# Patient Record
Sex: Female | Born: 1985 | Race: Black or African American | Hispanic: No | Marital: Married | Smoking: Never smoker
Health system: Southern US, Community
[De-identification: ages and names within clinical notes are randomized; demographics above are authoritative.]

## PROBLEM LIST (undated history)

## (undated) DIAGNOSIS — Z8619 Personal history of other infectious and parasitic diseases: Secondary | ICD-10-CM

## (undated) DIAGNOSIS — E1140 Type 2 diabetes mellitus with diabetic neuropathy, unspecified: Secondary | ICD-10-CM

## (undated) DIAGNOSIS — G629 Polyneuropathy, unspecified: Secondary | ICD-10-CM

## (undated) DIAGNOSIS — E109 Type 1 diabetes mellitus without complications: Secondary | ICD-10-CM

## (undated) DIAGNOSIS — K3184 Gastroparesis: Secondary | ICD-10-CM

## (undated) HISTORY — DX: Type 1 diabetes mellitus without complications: E10.9

## (undated) HISTORY — DX: Type 2 diabetes mellitus with diabetic neuropathy, unspecified: E11.40

## (undated) HISTORY — PX: DILATION AND CURETTAGE OF UTERUS: SHX78

## (undated) HISTORY — DX: Personal history of other infectious and parasitic diseases: Z86.19

---
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
## 2006-01-31 ENCOUNTER — Inpatient Hospital Stay (HOSPITAL_COMMUNITY): Admission: AD | Admit: 2006-01-31 | Discharge: 2006-01-31 | Payer: Self-pay | Admitting: Family Medicine

## 2006-06-01 ENCOUNTER — Inpatient Hospital Stay (HOSPITAL_COMMUNITY): Admission: AD | Admit: 2006-06-01 | Discharge: 2006-06-01 | Payer: Self-pay | Admitting: Obstetrics

## 2006-06-11 ENCOUNTER — Inpatient Hospital Stay (HOSPITAL_COMMUNITY): Admission: AD | Admit: 2006-06-11 | Discharge: 2006-06-11 | Payer: Self-pay | Admitting: Obstetrics

## 2006-06-24 ENCOUNTER — Inpatient Hospital Stay (HOSPITAL_COMMUNITY): Admission: AD | Admit: 2006-06-24 | Discharge: 2006-06-24 | Payer: Self-pay | Admitting: Obstetrics

## 2006-06-30 ENCOUNTER — Inpatient Hospital Stay (HOSPITAL_COMMUNITY): Admission: AD | Admit: 2006-06-30 | Discharge: 2006-06-30 | Payer: Self-pay | Admitting: Obstetrics

## 2006-07-06 ENCOUNTER — Inpatient Hospital Stay (HOSPITAL_COMMUNITY): Admission: AD | Admit: 2006-07-06 | Discharge: 2006-07-09 | Payer: Self-pay | Admitting: Obstetrics

## 2007-03-25 ENCOUNTER — Emergency Department (HOSPITAL_COMMUNITY): Admission: EM | Admit: 2007-03-25 | Discharge: 2007-03-25 | Payer: Self-pay | Admitting: Emergency Medicine

## 2008-06-20 ENCOUNTER — Emergency Department (HOSPITAL_COMMUNITY): Admission: EM | Admit: 2008-06-20 | Discharge: 2008-06-20 | Payer: Self-pay | Admitting: Emergency Medicine

## 2008-06-22 ENCOUNTER — Inpatient Hospital Stay (HOSPITAL_COMMUNITY): Admission: EM | Admit: 2008-06-22 | Discharge: 2008-06-24 | Payer: Self-pay | Admitting: Emergency Medicine

## 2008-06-28 ENCOUNTER — Emergency Department (HOSPITAL_COMMUNITY): Admission: EM | Admit: 2008-06-28 | Discharge: 2008-06-28 | Payer: Self-pay | Admitting: Emergency Medicine

## 2008-07-06 ENCOUNTER — Ambulatory Visit: Payer: Self-pay | Admitting: Family Medicine

## 2008-07-07 ENCOUNTER — Ambulatory Visit: Payer: Self-pay | Admitting: *Deleted

## 2008-07-17 ENCOUNTER — Emergency Department (HOSPITAL_COMMUNITY): Admission: EM | Admit: 2008-07-17 | Discharge: 2008-07-17 | Payer: Self-pay | Admitting: Emergency Medicine

## 2008-07-23 ENCOUNTER — Emergency Department (HOSPITAL_COMMUNITY): Admission: EM | Admit: 2008-07-23 | Discharge: 2008-07-23 | Payer: Self-pay | Admitting: Emergency Medicine

## 2009-01-02 ENCOUNTER — Emergency Department (HOSPITAL_COMMUNITY): Admission: EM | Admit: 2009-01-02 | Discharge: 2009-01-02 | Payer: Self-pay | Admitting: Emergency Medicine

## 2009-01-04 ENCOUNTER — Inpatient Hospital Stay (HOSPITAL_COMMUNITY): Admission: AD | Admit: 2009-01-04 | Discharge: 2009-01-04 | Payer: Self-pay | Admitting: Obstetrics & Gynecology

## 2009-05-18 ENCOUNTER — Emergency Department (HOSPITAL_COMMUNITY): Admission: EM | Admit: 2009-05-18 | Discharge: 2009-05-18 | Payer: Self-pay | Admitting: Emergency Medicine

## 2009-10-10 ENCOUNTER — Emergency Department (HOSPITAL_COMMUNITY): Admission: EM | Admit: 2009-10-10 | Discharge: 2009-10-10 | Payer: Self-pay | Admitting: Emergency Medicine

## 2009-10-15 IMAGING — CT CT HEAD W/O CM
1 series · 16 of 30 positions shown, 20 images · non-contrast
Comparison: None available

CLINICAL DATA: Diabetic ketoacidosis.  Severe headache.

CT HEAD WITHOUT CONTRAST
TECHNIQUE: Contiguous axial images were obtained from the base of
the skull through the vertex without contrast.

[Series 2: head_seq 4.5 h37s st · axial · 0.43mm/px · z∈[-186,-38]mm · 16 of 36 slices shown, 20 images]
[im 2/36  brain]
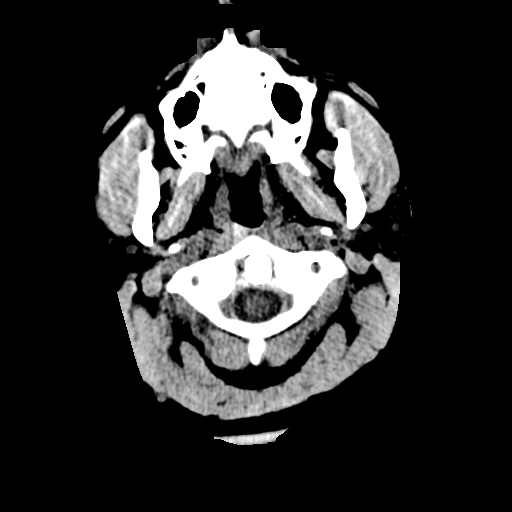
[im 2/36  bone]
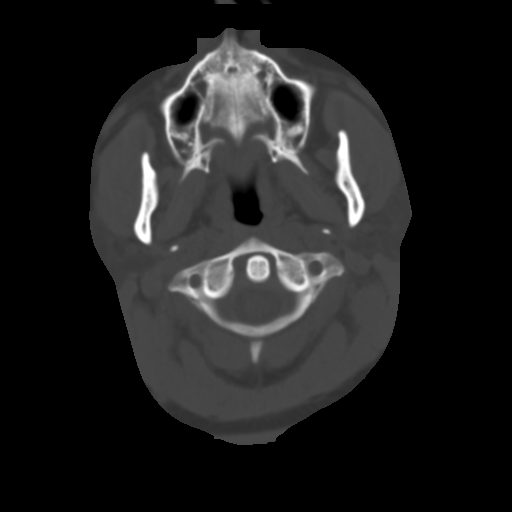
[im 4/36  brain]
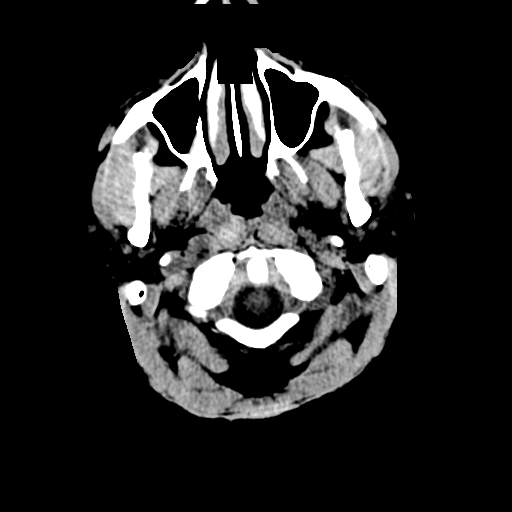
[im 7/36  brain]
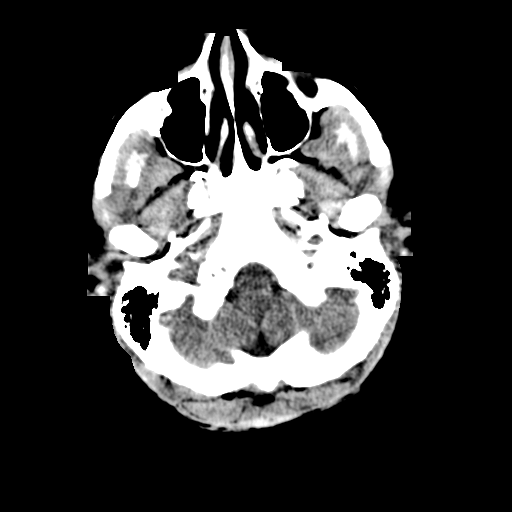
[im 9/36  brain]
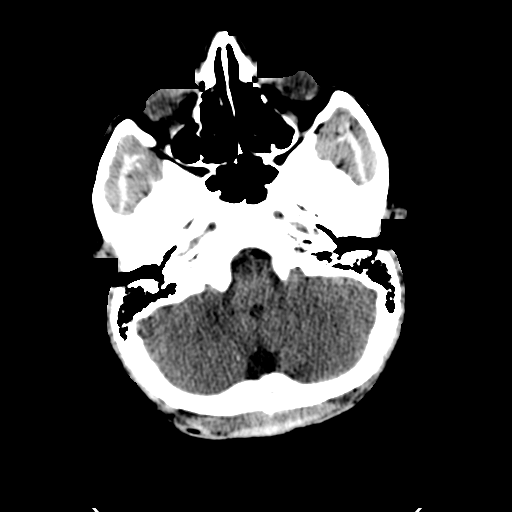
[im 10/36  brain]
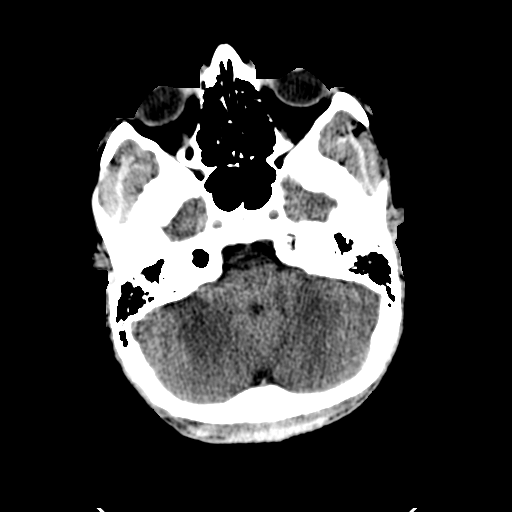
[im 10/36  bone]
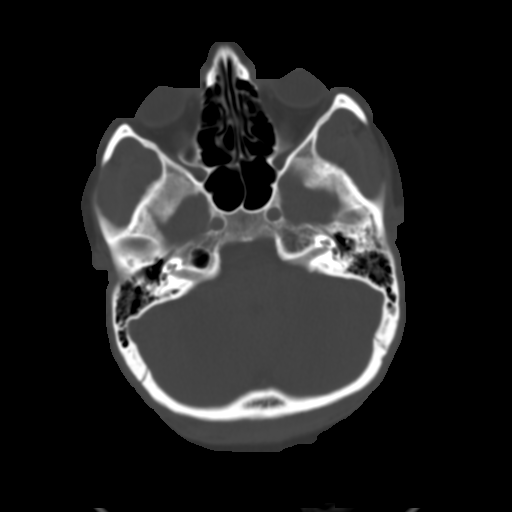
[im 13/36  brain]
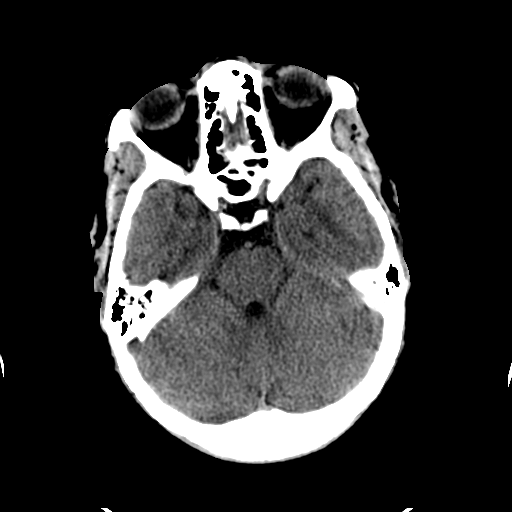
[im 15/36  brain]
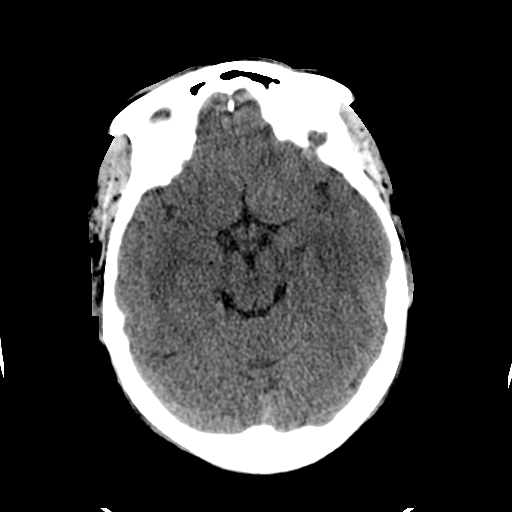
[im 17/36  brain]
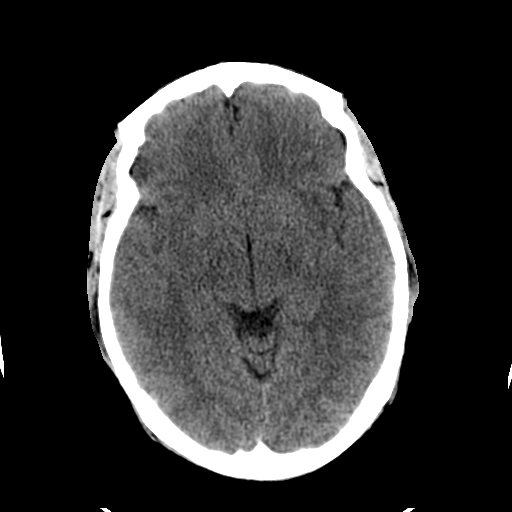
[im 19/36  brain]
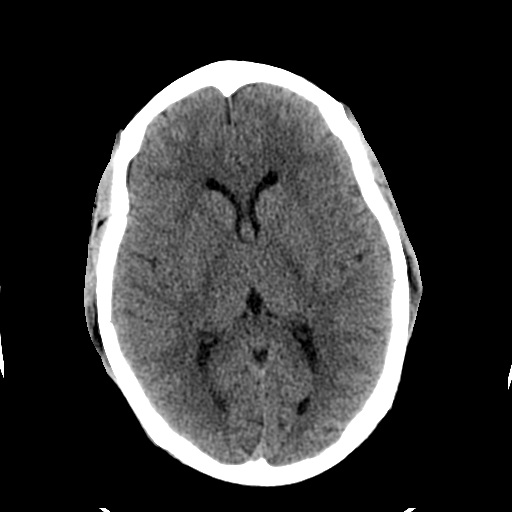
[im 19/36  bone]
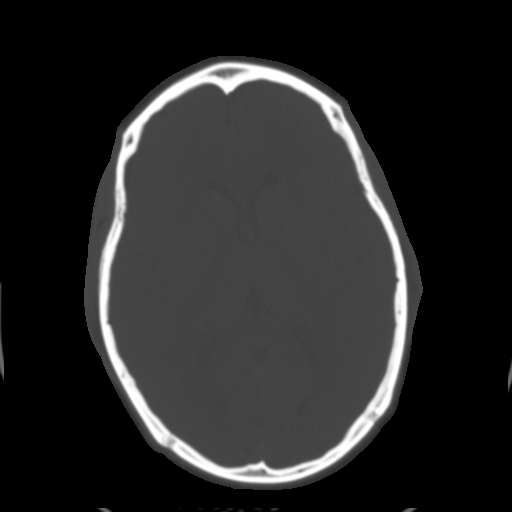
[im 21/36  brain]
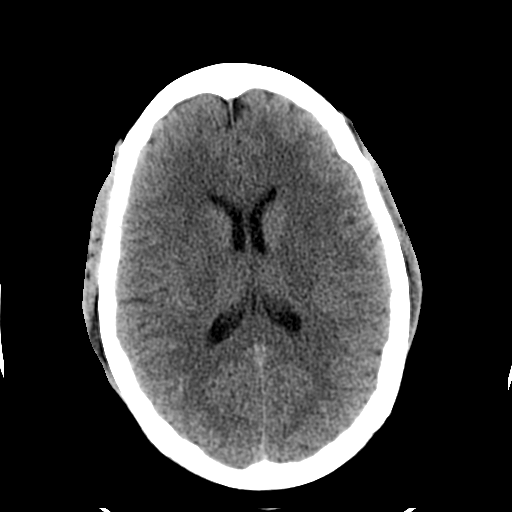
[im 23/36  brain]
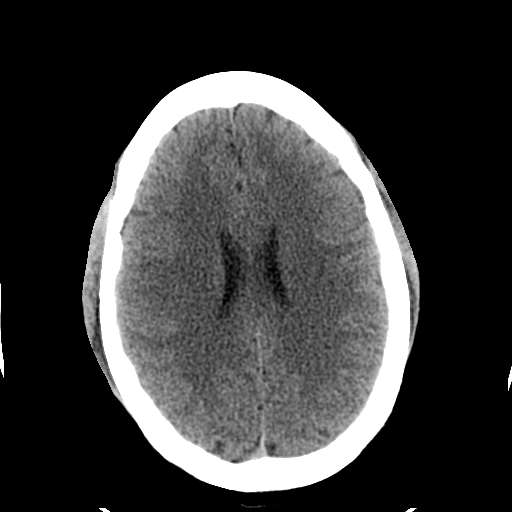
[im 26/36  brain]
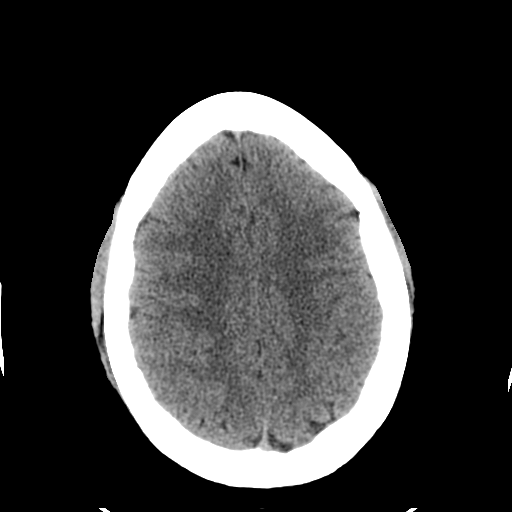
[im 27/36  brain]
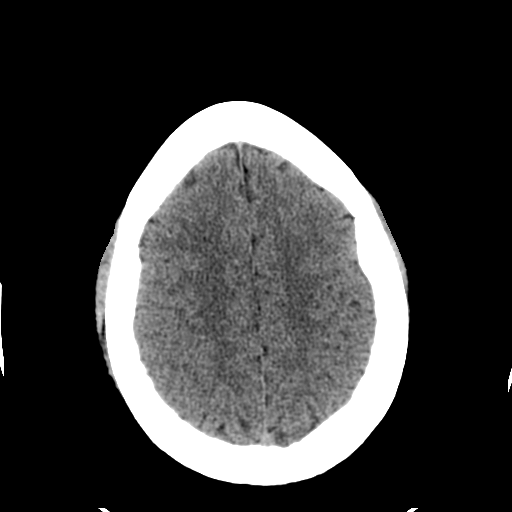
[im 27/36  bone]
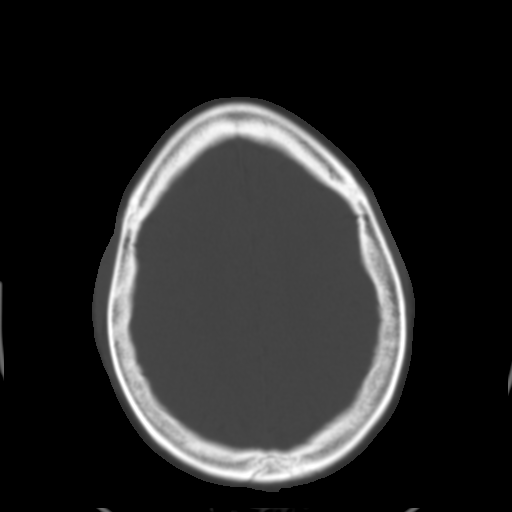
[im 29/36  brain]
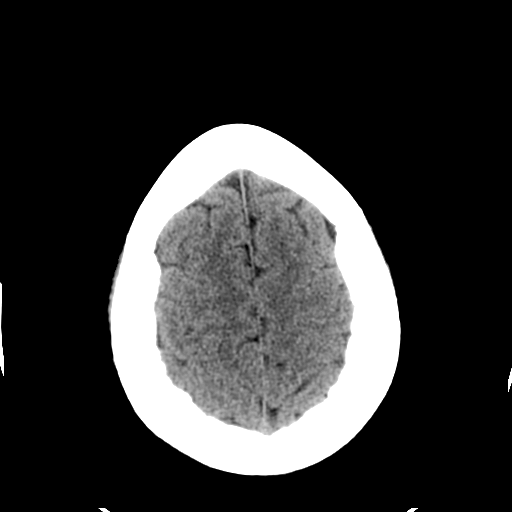
[im 32/36  brain]
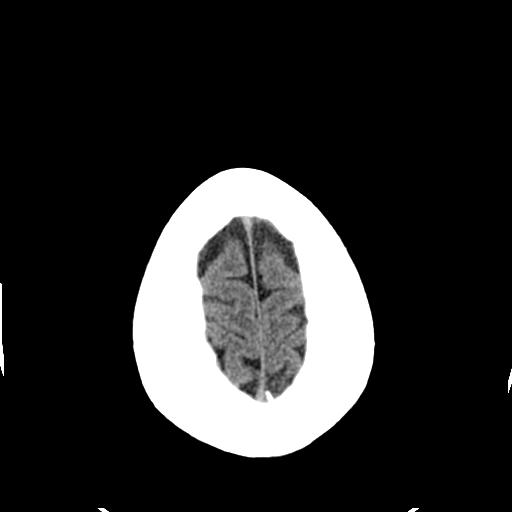
[im 34/36  brain]
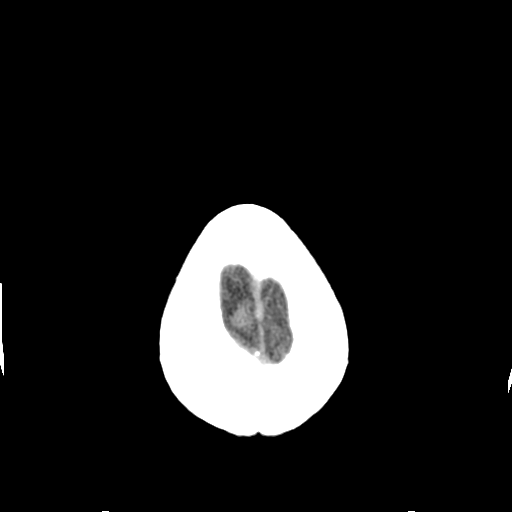

[16 of 30 positions shown; findings below may reference images not displayed]

FINDINGS: Mild right maxillary mucosal thickening.  Mastoid air
cells clear.  Ethmoid air cells clear.  Frontal sinuses
unremarkable.

No mass lesion, mass effect, midline shift, hydrocephalus,
hemorrhage.  No territorial ischemia/infarct.
IMPRESSION: No acute intracranial abnormality.

## 2009-10-25 ENCOUNTER — Emergency Department (HOSPITAL_COMMUNITY): Admission: EM | Admit: 2009-10-25 | Discharge: 2009-10-26 | Payer: Self-pay | Admitting: Emergency Medicine

## 2009-11-27 ENCOUNTER — Emergency Department (HOSPITAL_COMMUNITY): Admission: EM | Admit: 2009-11-27 | Discharge: 2009-11-27 | Payer: Self-pay | Admitting: Emergency Medicine

## 2009-12-18 ENCOUNTER — Emergency Department (HOSPITAL_COMMUNITY): Admission: EM | Admit: 2009-12-18 | Discharge: 2009-12-18 | Payer: Self-pay | Admitting: Family Medicine

## 2010-03-17 ENCOUNTER — Emergency Department (HOSPITAL_COMMUNITY): Admission: EM | Admit: 2010-03-17 | Discharge: 2010-03-18 | Payer: Self-pay | Admitting: Internal Medicine

## 2010-04-06 ENCOUNTER — Emergency Department (HOSPITAL_COMMUNITY): Admission: EM | Admit: 2010-04-06 | Discharge: 2010-04-07 | Payer: Self-pay | Admitting: Emergency Medicine

## 2010-04-30 ENCOUNTER — Emergency Department (HOSPITAL_COMMUNITY): Admission: EM | Admit: 2010-04-30 | Discharge: 2010-04-30 | Payer: Self-pay | Admitting: Emergency Medicine

## 2010-07-02 ENCOUNTER — Emergency Department (HOSPITAL_COMMUNITY): Admission: EM | Admit: 2010-07-02 | Discharge: 2010-07-02 | Payer: Self-pay | Admitting: Emergency Medicine

## 2010-08-26 ENCOUNTER — Inpatient Hospital Stay (HOSPITAL_COMMUNITY)
Admission: EM | Admit: 2010-08-26 | Discharge: 2010-08-27 | Payer: Self-pay | Source: Home / Self Care | Attending: Internal Medicine | Admitting: Internal Medicine

## 2010-09-03 LAB — GLUCOSE, CAPILLARY
Glucose-Capillary: 225 mg/dL — ABNORMAL HIGH (ref 70–99)
Glucose-Capillary: 275 mg/dL — ABNORMAL HIGH (ref 70–99)
Glucose-Capillary: 368 mg/dL — ABNORMAL HIGH (ref 70–99)
Glucose-Capillary: 65 mg/dL — ABNORMAL LOW (ref 70–99)
Glucose-Capillary: >600 mg/dL (ref 70–99)
Glucose-Capillary: 103 mg/dL — ABNORMAL HIGH (ref 70–99)
Glucose-Capillary: 137 mg/dL — ABNORMAL HIGH (ref 70–99)
Glucose-Capillary: 148 mg/dL — ABNORMAL HIGH (ref 70–99)
Glucose-Capillary: 149 mg/dL — ABNORMAL HIGH (ref 70–99)
Glucose-Capillary: 168 mg/dL — ABNORMAL HIGH (ref 70–99)
Glucose-Capillary: 187 mg/dL — ABNORMAL HIGH (ref 70–99)
Glucose-Capillary: 192 mg/dL — ABNORMAL HIGH (ref 70–99)
Glucose-Capillary: 195 mg/dL — ABNORMAL HIGH (ref 70–99)
Glucose-Capillary: 215 mg/dL — ABNORMAL HIGH (ref 70–99)
Glucose-Capillary: 218 mg/dL — ABNORMAL HIGH (ref 70–99)
Glucose-Capillary: 228 mg/dL — ABNORMAL HIGH (ref 70–99)
Glucose-Capillary: 243 mg/dL — ABNORMAL HIGH (ref 70–99)
Glucose-Capillary: 253 mg/dL — ABNORMAL HIGH (ref 70–99)
Glucose-Capillary: 256 mg/dL — ABNORMAL HIGH (ref 70–99)
Glucose-Capillary: 275 mg/dL — ABNORMAL HIGH (ref 70–99)
Glucose-Capillary: 319 mg/dL — ABNORMAL HIGH (ref 70–99)
Glucose-Capillary: 322 mg/dL — ABNORMAL HIGH (ref 70–99)
Glucose-Capillary: 373 mg/dL — ABNORMAL HIGH (ref 70–99)
Glucose-Capillary: 58 mg/dL — ABNORMAL LOW (ref 70–99)
Glucose-Capillary: 61 mg/dL — ABNORMAL LOW (ref 70–99)

## 2010-09-03 LAB — POCT PREGNANCY, URINE: Preg Test, Ur: NEGATIVE

## 2010-09-03 LAB — BASIC METABOLIC PANEL WITH GFR
BUN: 8 mg/dL (ref 6–23)
CO2: 26 meq/L (ref 19–32)
Calcium: 9.7 mg/dL (ref 8.4–10.5)
Chloride: 93 meq/L — ABNORMAL LOW (ref 96–112)
Creatinine, Ser: 0.93 mg/dL (ref 0.4–1.2)
GFR calc Af Amer: >60 mL/min (ref >60)
GFR calc non Af Amer: >60 mL/min (ref >60)
Glucose, Bld: 620 mg/dL (ref 70–99)
Potassium: 4.1 meq/L (ref 3.5–5.1)
Sodium: 130 meq/L — ABNORMAL LOW (ref 135–145)

## 2010-09-03 LAB — BLOOD GAS, ARTERIAL
Acid-Base Excess: 0.1 mmol/L (ref 0.0–2.0)
Bicarbonate: 24.6 meq/L — ABNORMAL HIGH (ref 20.0–24.0)
Drawn by: 308601
FIO2: 0.21 %
O2 Saturation: 97.2 %
Patient temperature: 98.6
TCO2: 22.3 mmol/L (ref 0–100)
pCO2 arterial: 42.1 mmHg (ref 35.0–45.0)
pH, Arterial: 7.386 (ref 7.350–7.400)
pO2, Arterial: 87.9 mmHg (ref 80.0–100.0)

## 2010-09-03 LAB — COMPREHENSIVE METABOLIC PANEL
ALT: 11 U/L (ref 0–35)
AST: 12 U/L (ref 0–37)
Albumin: 2.9 g/dL — ABNORMAL LOW (ref 3.5–5.2)
Alkaline Phosphatase: 65 U/L (ref 39–117)
BUN: 7 mg/dL (ref 6–23)
CO2: 27 mEq/L (ref 19–32)
Calcium: 8.6 mg/dL (ref 8.4–10.5)
Chloride: 108 mEq/L (ref 96–112)
Creatinine, Ser: 0.69 mg/dL (ref 0.4–1.2)
GFR calc Af Amer: >60 mL/min (ref >60)
GFR calc non Af Amer: >60 mL/min (ref >60)
Glucose, Bld: 204 mg/dL — ABNORMAL HIGH (ref 70–99)
Potassium: 3.4 mEq/L — ABNORMAL LOW (ref 3.5–5.1)
Sodium: 139 mEq/L (ref 135–145)
Total Bilirubin: 0.3 mg/dL (ref 0.3–1.2)
Total Protein: 5.7 g/dL — ABNORMAL LOW (ref 6.0–8.3)

## 2010-09-03 LAB — DIFFERENTIAL
Basophils Absolute: 0 K/uL (ref 0.0–0.1)
Basophils Relative: 1 % (ref 0–1)
Eosinophils Absolute: 0.2 K/uL (ref 0.0–0.7)
Eosinophils Relative: 3 % (ref 0–5)
Lymphocytes Relative: 37 % (ref 12–46)
Lymphs Abs: 2.4 K/uL (ref 0.7–4.0)
Monocytes Absolute: 0.3 K/uL (ref 0.1–1.0)
Monocytes Relative: 5 % (ref 3–12)
Neutro Abs: 3.6 K/uL (ref 1.7–7.7)
Neutrophils Relative %: 55 % (ref 43–77)

## 2010-09-03 LAB — HEMOGLOBIN A1C
Hgb A1c MFr Bld: 12.6 % — ABNORMAL HIGH (ref <5.7)
Mean Plasma Glucose: 315 mg/dL — ABNORMAL HIGH (ref <117)

## 2010-09-03 LAB — CBC
HCT: 39.2 % (ref 36.0–46.0)
Hemoglobin: 13.8 g/dL (ref 12.0–15.0)
MCH: 31.2 pg (ref 26.0–34.0)
MCHC: 35.2 g/dL (ref 30.0–36.0)
MCV: 88.7 fL (ref 78.0–100.0)
Platelets: 313 K/uL (ref 150–400)
RBC: 4.42 MIL/uL (ref 3.87–5.11)
RDW: 12.6 % (ref 11.5–15.5)
WBC: 6.6 K/uL (ref 4.0–10.5)

## 2010-09-03 LAB — BLOOD GAS, VENOUS
Acid-base deficit: 6.1 mmol/L — ABNORMAL HIGH (ref 0.0–2.0)
Bicarbonate: 22.4 mEq/L (ref 20.0–24.0)
O2 Saturation: 75.6 %
Patient temperature: 98.6
TCO2: 20.9 mmol/L (ref 0–100)
pCO2, Ven: 58.8 mmHg — ABNORMAL HIGH (ref 45.0–50.0)
pH, Ven: 7.206 — ABNORMAL LOW (ref 7.250–7.300)
pO2, Ven: 48.4 mmHg — ABNORMAL HIGH (ref 30.0–45.0)

## 2010-09-03 LAB — URINALYSIS, ROUTINE W REFLEX MICROSCOPIC
Bilirubin Urine: NEGATIVE
Ketones, ur: NEGATIVE mg/dL
Leukocytes, UA: NEGATIVE
Nitrite: NEGATIVE
Protein, ur: NEGATIVE mg/dL
Specific Gravity, Urine: 1.029 (ref 1.005–1.030)
Urine Glucose, Fasting: >1000 mg/dL — AB
Urobilinogen, UA: 0.2 mg/dL (ref 0.0–1.0)
pH: 6.5 (ref 5.0–8.0)

## 2010-09-03 LAB — LIPID PANEL
Cholesterol: 205 mg/dL — ABNORMAL HIGH (ref 0–200)
HDL: 34 mg/dL — ABNORMAL LOW (ref >39)
LDL Cholesterol: 115 mg/dL — ABNORMAL HIGH (ref 0–99)
Total CHOL/HDL Ratio: 6 RATIO
Triglycerides: 278 mg/dL — ABNORMAL HIGH (ref <150)
VLDL: 56 mg/dL — ABNORMAL HIGH (ref 0–40)

## 2010-09-03 LAB — MRSA PCR SCREENING: MRSA by PCR: NEGATIVE

## 2010-09-03 LAB — CARDIAC PANEL(CRET KIN+CKTOT+MB+TROPI)
CK, MB: 0.8 ng/mL (ref 0.3–4.0)
CK, MB: 0.8 ng/mL (ref 0.3–4.0)
Relative Index: INVALID (ref 0.0–2.5)
Relative Index: INVALID (ref 0.0–2.5)
Total CK: 84 U/L (ref 7–177)
Total CK: 97 U/L (ref 7–177)
Troponin I: 0.01 ng/mL (ref 0.00–0.06)
Troponin I: <0.01 ng/mL (ref 0.00–0.06)

## 2010-09-03 LAB — MAGNESIUM: Magnesium: 1.8 mg/dL (ref 1.5–2.5)

## 2010-09-03 LAB — LACTIC ACID, PLASMA: Lactic Acid, Venous: 1.5 mmol/L (ref 0.5–2.2)

## 2010-09-03 LAB — URINE MICROSCOPIC-ADD ON

## 2010-09-03 LAB — LIPASE, BLOOD: Lipase: 37 U/L (ref 11–59)

## 2010-09-03 LAB — KETONES, QUALITATIVE: Acetone, Bld: NEGATIVE

## 2010-09-13 NOTE — Discharge Summary (Addendum)
  NAMELIZZA, [name]              ACCOUNT NO.:  [id number]  MEDICAL RECORD NO.:  [id number]          PATIENT TYPE:  INP  LOCATION:  1241                         FACILITY:  [facility]  PHYSICIAN:  Courtland Reas I Myrene Bougher, MD      DATE OF BIRTH:  [birth year]  DATE OF ADMISSION:  08/26/2010 DATE OF DISCHARGE:  08/27/2010                              DISCHARGE SUMMARY   PRIMARY ENDOCRINOLOGIST:  Dorisann Frames, M.D.  PRIMARY CARE PHYSICIAN:  [facility].  DISCHARGE DIAGNOSES: 1. Hyperosmolar, hyperglycemia. 2. Type 1 diabetes mellitus, on insulin pump. 3. Out of insulin secondary to insurance problem. 4. Hyperlipidemia. 5. Hypokalemia.  DISCHARGE MEDICATIONS: 1. The patient will resume her insulin pump. 2. Zocor 20 mg p.o. daily.  FOLLOWUP:  The patient will follow up with Dr.  Talmage Nap and follow up with [facility].  HISTORY OF PRESENT ILLNESS:  This is a 25 year old female, history of type 1 diabetes, on insulin pump, come to the ED, elevated sugar, presented with nausea and vomiting.  As per patient, she gets her medications fill because of her insurance.  Her sugars have been running around 500 to 600 associated with nausea and vomiting and presented to the emergency room.  The patient has a CBG of 620 and her pH is 7.386, CO2 42, oxygen of 87 and bicarb 24.6.  The patient admitted to stepdown and started on aggressive IV fluids and insulin drip.  Potassium dropped, status post replacement.  The patient will stop her insulin NovoLog pump, will be watched to make sure patient's blood sugar under good control after resuming her insulin pump and the patient needs to follow up with her primary care and her endocrinologist as outpatient. Hyperlipidemia. Zocor was added to patient's medications.     Anniebell Bedore Bosie Helper, MD     HIE/MEDQ  D:  08/27/2010  T:  08/27/2010  Job:  [id number]  Electronically Signed by Ebony Cargo MD on 09/13/2010 12:02:14 PM

## 2010-09-18 NOTE — H&P (Signed)
[name], [name]              ACCOUNT NO.:  [id number]  MEDICAL RECORD NO.:  [id number]          PATIENT TYPE:  INP  LOCATION:  0102                         FACILITY:  [facility]  PHYSICIAN:  Richarda Overlie, MD       DATE OF BIRTH:  08/11/1986  DATE OF ADMISSION:  08/26/2010 DATE OF DISCHARGE:                             HISTORY & PHYSICAL   PRIMARY CARE PHYSICIAN:  [facility].  PRIMARY ENDOCRINOLOGIST:  Dorisann Frames, M.D.  CHIEF COMPLAINT:  Elevated sugars.  SUBJECTIVE:  This is a 25 year old female with a history of type 1 diabetes and prior history of DKA on an insulin pump at home who came to the ED today because of elevated sugars at home.  The patient has also been somewhat nauseous with decreased oral intake.  She also complains of weakness.  She has been out of her insulin since Friday.  The patient cannot get her medications filled because of her insurance.  Her sugars have been running in the 500-600 range at home.  The patient presented to the ED with a sugar of 620.  She denies any fever, chills or rigors, any chest pain, shortness of breath, any cough, or any sick contacts at home.  PAST MEDICAL HISTORY:  Insulin dependent diabetes, on an insulin pump.  PAST SURGICAL HISTORY:  Cesarean section.  FAMILY HISTORY:  Diabetes runs in the family.  Grandmother passed away from kidney failure secondary to diabetes.  Mother has glaucoma.  SOCIAL HISTORY:  She denies use of smoking, alcohol, or tobacco.  ALLERGIES:  No known drug allergies.  REVIEW OF SYSTEMS:  Complete review of systems was done as documented in the HPI.  HOME MEDICATIONS:  Novolin insulin pump with a very poor basal rate and a bolus of 10 units 3 times a day with meals.  PHYSICAL EXAMINATION:  VITAL SIGNS:  Blood pressure 122/84, pulse of 82, respirations 18, temperature 98.7. GENERAL:  Comfortable in no acute cardiopulmonary distress. HEENT:  Pupils equal and reactive.   Extraocular movements are intact. NECK:  Supple.  No JVD. LUNGS:  Clear to auscultation bilaterally.  No wheezes, crackles, or rhonchi. CARDIOVASCULAR:  Regular rate and rhythm.  No murmurs, rubs, or gallops. ABDOMEN:  Obese, soft, nontender, nondistended. EXTREMITIES:  Without cyanosis or edema. NEUROLOGIC:  Cranial nerves II through XII appear to be grossly intact. SKIN:  Without any skin rashes.  LABORATORY DATA:  Sodium 130, potassium 4.1, chloride 93, bicarb 26, glucose 620, BUN 8, creatinine 0.93, calcium 9.7.  Urinalysis negative. CBC; WBC of 6.6, hemoglobin 13.8, hematocrit 39.2, and platelet count of 313.  Venous blood gas shows a pH of 7.206, pCO2 of 58, and a pO2 of 28.4.  Pregnancy test is negative.  ASSESSMENT AND PLAN:  Hyperosmolar hyperglycemic state without any laboratory evidence of diabetic ketoacidosis.  The patient's anion gap is 11 and bicarb is 26 and her urine ketones are negative.  The patient will be started on a glucose stabilizer.  She will be hydrated with normal saline at 250 cc/hr.  When her CBGs are less than 250, she will be transitioned to half normal  saline.  She will be kept n.p.o. until her sugars are brought under control.  Because the patient is on Glucommander drip, she will be kept in step-down.  We will transition her to Lantus pre-meal and sliding scale insulin in the morning when the sugars have stabilized.  She is a full code     Richarda Overlie, MD     NA/MEDQ  D:  08/26/2010  T:  08/26/2010  Job:  [id number]  Electronically Signed by Richarda Overlie MD on 09/18/2010 04:26:57 PM

## 2010-10-30 LAB — URINALYSIS, ROUTINE W REFLEX MICROSCOPIC
Bilirubin Urine: NEGATIVE
Glucose, UA: >1000 mg/dL — AB
Hgb urine dipstick: NEGATIVE
Ketones, ur: NEGATIVE mg/dL
Leukocytes, UA: NEGATIVE
Protein, ur: NEGATIVE mg/dL
Urobilinogen, UA: 0.2 mg/dL (ref 0.0–1.0)

## 2010-10-30 LAB — DIFFERENTIAL
Basophils Absolute: 0 10*3/uL (ref 0.0–0.1)
Basophils Relative: 1 % (ref 0–1)
Eosinophils Absolute: 0.2 10*3/uL (ref 0.0–0.7)
Eosinophils Relative: 3 % (ref 0–5)
Neutrophils Relative %: 45 % (ref 43–77)

## 2010-10-30 LAB — HEPATIC FUNCTION PANEL
ALT: 13 U/L (ref 0–35)
Albumin: 3.5 g/dL (ref 3.5–5.2)

## 2010-10-30 LAB — CBC
HCT: 38.6 % (ref 36.0–46.0)
Hemoglobin: 13.3 g/dL (ref 12.0–15.0)
MCH: 31 pg (ref 26.0–34.0)
RDW: 13.9 % (ref 11.5–15.5)
WBC: 6.5 10*3/uL (ref 4.0–10.5)

## 2010-10-30 LAB — GLUCOSE, CAPILLARY
Glucose-Capillary: 239 mg/dL — ABNORMAL HIGH (ref 70–99)
Glucose-Capillary: 267 mg/dL — ABNORMAL HIGH (ref 70–99)
Glucose-Capillary: 424 mg/dL — ABNORMAL HIGH (ref 70–99)

## 2010-10-30 LAB — BASIC METABOLIC PANEL
Calcium: 9.1 mg/dL (ref 8.4–10.5)
GFR calc Af Amer: >60 mL/min (ref >60)
GFR calc non Af Amer: >60 mL/min (ref >60)
Glucose, Bld: 300 mg/dL — ABNORMAL HIGH (ref 70–99)
Potassium: 3.8 mEq/L (ref 3.5–5.1)

## 2010-10-30 LAB — URINE MICROSCOPIC-ADD ON

## 2010-10-30 LAB — PREGNANCY, URINE: Preg Test, Ur: NEGATIVE

## 2010-11-01 LAB — CBC
MCH: 31.6 pg (ref 26.0–34.0)
MCV: 92.5 fL (ref 78.0–100.0)
Platelets: 301 10*3/uL (ref 150–400)
RBC: 4.23 MIL/uL (ref 3.87–5.11)
RDW: 13.2 % (ref 11.5–15.5)
WBC: 9.5 10*3/uL (ref 4.0–10.5)

## 2010-11-01 LAB — DIFFERENTIAL
Basophils Absolute: 0.1 10*3/uL (ref 0.0–0.1)
Eosinophils Absolute: 0.4 10*3/uL (ref 0.0–0.7)
Eosinophils Relative: 4 % (ref 0–5)
Lymphocytes Relative: 41 % (ref 12–46)
Lymphs Abs: 3.9 10*3/uL (ref 0.7–4.0)
Neutrophils Relative %: 48 % (ref 43–77)

## 2010-11-01 LAB — URINALYSIS, ROUTINE W REFLEX MICROSCOPIC
Glucose, UA: >1000 mg/dL — AB
Glucose, UA: >1000 mg/dL — AB
Hgb urine dipstick: NEGATIVE
Leukocytes, UA: NEGATIVE
Leukocytes, UA: NEGATIVE
Nitrite: NEGATIVE
Nitrite: NEGATIVE
Specific Gravity, Urine: 1.042 — ABNORMAL HIGH (ref 1.005–1.030)
Specific Gravity, Urine: 1.037 — ABNORMAL HIGH (ref 1.005–1.030)
pH: 6 (ref 5.0–8.0)

## 2010-11-01 LAB — COMPREHENSIVE METABOLIC PANEL
ALT: 19 U/L (ref 0–35)
AST: 16 U/L (ref 0–37)
Albumin: 3.8 g/dL (ref 3.5–5.2)
Alkaline Phosphatase: 82 U/L (ref 39–117)
BUN: 7 mg/dL (ref 6–23)
Chloride: 98 mEq/L (ref 96–112)
Potassium: 4 mEq/L (ref 3.5–5.1)
Sodium: 134 mEq/L — ABNORMAL LOW (ref 135–145)
Total Bilirubin: 0.4 mg/dL (ref 0.3–1.2)
Total Protein: 7.2 g/dL (ref 6.0–8.3)

## 2010-11-01 LAB — BLOOD GAS, VENOUS
Acid-Base Excess: 0.9 mmol/L (ref 0.0–2.0)
pCO2, Ven: 50.4 mmHg — ABNORMAL HIGH (ref 45.0–50.0)
pH, Ven: 7.343 — ABNORMAL HIGH (ref 7.250–7.300)
pO2, Ven: 25.1 mmHg — CL (ref 30.0–45.0)

## 2010-11-01 LAB — BASIC METABOLIC PANEL
BUN: 10 mg/dL (ref 6–23)
Calcium: 9.3 mg/dL (ref 8.4–10.5)
Chloride: 99 mEq/L (ref 96–112)
Creatinine, Ser: 0.77 mg/dL (ref 0.4–1.2)
GFR calc Af Amer: >60 mL/min (ref >60)

## 2010-11-01 LAB — GLUCOSE, CAPILLARY
Glucose-Capillary: 366 mg/dL — ABNORMAL HIGH (ref 70–99)
Glucose-Capillary: 420 mg/dL — ABNORMAL HIGH (ref 70–99)

## 2010-11-01 LAB — KETONES, QUALITATIVE: Acetone, Bld: NEGATIVE

## 2010-11-01 LAB — POCT PREGNANCY, URINE: Preg Test, Ur: NEGATIVE

## 2010-11-01 LAB — URINE MICROSCOPIC-ADD ON: Urine-Other: NONE SEEN

## 2010-11-06 LAB — GLUCOSE, CAPILLARY

## 2010-11-07 LAB — GLUCOSE, CAPILLARY: Glucose-Capillary: 166 mg/dL — ABNORMAL HIGH (ref 70–99)

## 2010-11-07 LAB — URINALYSIS, ROUTINE W REFLEX MICROSCOPIC
Bilirubin Urine: NEGATIVE
Ketones, ur: NEGATIVE mg/dL
Nitrite: NEGATIVE
Protein, ur: NEGATIVE mg/dL
Urobilinogen, UA: 0.2 mg/dL (ref 0.0–1.0)

## 2010-11-07 LAB — POCT I-STAT, CHEM 8
Calcium, Ion: 1.23 mmol/L (ref 1.12–1.32)
Creatinine, Ser: 0.8 mg/dL (ref 0.4–1.2)
Glucose, Bld: 131 mg/dL — ABNORMAL HIGH (ref 70–99)
Hemoglobin: 14.6 g/dL (ref 12.0–15.0)
TCO2: 30 mmol/L (ref 0–100)

## 2010-11-11 LAB — POCT I-STAT, CHEM 8
BUN: 8 mg/dL (ref 6–23)
Creatinine, Ser: 0.7 mg/dL (ref 0.4–1.2)
Glucose, Bld: 180 mg/dL — ABNORMAL HIGH (ref 70–99)
Hemoglobin: 12.9 g/dL (ref 12.0–15.0)
Sodium: 138 mEq/L (ref 135–145)
TCO2: 26 mmol/L (ref 0–100)

## 2010-11-11 LAB — DIFFERENTIAL
Basophils Absolute: 0 10*3/uL (ref 0.0–0.1)
Basophils Relative: 1 % (ref 0–1)
Monocytes Relative: 6 % (ref 3–12)
Neutro Abs: 3.6 10*3/uL (ref 1.7–7.7)
Neutrophils Relative %: 47 % (ref 43–77)

## 2010-11-11 LAB — URINALYSIS, ROUTINE W REFLEX MICROSCOPIC
Ketones, ur: NEGATIVE mg/dL
Nitrite: NEGATIVE
Specific Gravity, Urine: 1.006 (ref 1.005–1.030)
pH: 6 (ref 5.0–8.0)

## 2010-11-11 LAB — POCT I-STAT 3, ART BLOOD GAS (G3+)
Acid-base deficit: 1 mmol/L (ref 0.0–2.0)
O2 Saturation: 98 %
TCO2: 25 mmol/L (ref 0–100)
pCO2 arterial: 40.3 mmHg (ref 35.0–45.0)
pO2, Arterial: 99 mmHg (ref 80.0–100.0)

## 2010-11-11 LAB — POCT PREGNANCY, URINE: Preg Test, Ur: NEGATIVE

## 2010-11-11 LAB — CBC
MCHC: 34.3 g/dL (ref 30.0–36.0)
RBC: 3.9 MIL/uL (ref 3.87–5.11)

## 2010-11-11 LAB — GLUCOSE, CAPILLARY

## 2010-11-15 ENCOUNTER — Inpatient Hospital Stay (INDEPENDENT_AMBULATORY_CARE_PROVIDER_SITE_OTHER)
Admission: RE | Admit: 2010-11-15 | Discharge: 2010-11-15 | Disposition: A | Discharge status: Home / Self Care | Payer: BC Managed Care – PPO | Source: Ambulatory Visit | Attending: Family Medicine | Admitting: Family Medicine

## 2010-11-15 DIAGNOSIS — N760 Acute vaginitis: Secondary | ICD-10-CM

## 2010-11-15 LAB — WET PREP, GENITAL

## 2010-11-15 LAB — POCT URINALYSIS DIP (DEVICE)
Ketones, ur: NEGATIVE mg/dL
Protein, ur: NEGATIVE mg/dL
Specific Gravity, Urine: 1.01 (ref 1.005–1.030)
Urobilinogen, UA: 0.2 mg/dL (ref 0.0–1.0)
pH: 7 (ref 5.0–8.0)

## 2010-11-16 LAB — GC/CHLAMYDIA PROBE AMP, GENITAL
Chlamydia, DNA Probe: NEGATIVE
GC Probe Amp, Genital: NEGATIVE

## 2010-11-23 LAB — POCT I-STAT, CHEM 8
BUN: 6 mg/dL (ref 6–23)
Chloride: 105 mEq/L (ref 96–112)
Creatinine, Ser: 0.6 mg/dL (ref 0.4–1.2)
Hemoglobin: 15 g/dL (ref 12.0–15.0)
Potassium: 4.4 mEq/L (ref 3.5–5.1)
Sodium: 138 mEq/L (ref 135–145)

## 2010-11-23 LAB — URINALYSIS, ROUTINE W REFLEX MICROSCOPIC
Bilirubin Urine: NEGATIVE
Ketones, ur: NEGATIVE mg/dL
Nitrite: NEGATIVE
Protein, ur: NEGATIVE mg/dL
Urobilinogen, UA: 0.2 mg/dL (ref 0.0–1.0)

## 2010-11-23 LAB — GLUCOSE, CAPILLARY: Glucose-Capillary: 165 mg/dL — ABNORMAL HIGH (ref 70–99)

## 2010-11-23 LAB — URINE MICROSCOPIC-ADD ON

## 2010-11-27 LAB — DIFFERENTIAL
Basophils Relative: 1 % (ref 0–1)
Monocytes Absolute: 0.8 10*3/uL (ref 0.1–1.0)
Monocytes Relative: 10 % (ref 3–12)
Neutro Abs: 4 10*3/uL (ref 1.7–7.7)

## 2010-11-27 LAB — CBC
HCT: 36.4 % (ref 36.0–46.0)
Hemoglobin: 12.3 g/dL (ref 12.0–15.0)
MCHC: 33.8 g/dL (ref 30.0–36.0)
RBC: 3.95 MIL/uL (ref 3.87–5.11)

## 2010-11-27 LAB — URINE MICROSCOPIC-ADD ON

## 2010-11-27 LAB — POCT CARDIAC MARKERS
CKMB, poc: <1 ng/mL — ABNORMAL LOW (ref 1.0–8.0)
Myoglobin, poc: 27.6 ng/mL (ref 12–200)
Troponin i, poc: <0.05 ng/mL (ref 0.00–0.09)

## 2010-11-27 LAB — URINE CULTURE

## 2010-11-27 LAB — URINALYSIS, ROUTINE W REFLEX MICROSCOPIC
Glucose, UA: NEGATIVE mg/dL
Hgb urine dipstick: NEGATIVE
Protein, ur: NEGATIVE mg/dL
Specific Gravity, Urine: 1.017 (ref 1.005–1.030)

## 2010-12-16 ENCOUNTER — Inpatient Hospital Stay (HOSPITAL_COMMUNITY)
Admission: EM | Admit: 2010-12-16 | Discharge: 2010-12-16 | DRG: 295 | Disposition: A | Discharge status: Home / Self Care | Payer: BC Managed Care – PPO | Attending: Emergency Medicine | Admitting: Emergency Medicine

## 2010-12-16 DIAGNOSIS — Z9119 Patient's noncompliance with other medical treatment and regimen: Secondary | ICD-10-CM

## 2010-12-16 DIAGNOSIS — R5381 Other malaise: Secondary | ICD-10-CM | POA: Diagnosis present

## 2010-12-16 DIAGNOSIS — R112 Nausea with vomiting, unspecified: Secondary | ICD-10-CM | POA: Diagnosis present

## 2010-12-16 DIAGNOSIS — R109 Unspecified abdominal pain: Secondary | ICD-10-CM | POA: Diagnosis present

## 2010-12-16 DIAGNOSIS — Z91199 Patient's noncompliance with other medical treatment and regimen due to unspecified reason: Secondary | ICD-10-CM

## 2010-12-16 DIAGNOSIS — E119 Type 2 diabetes mellitus without complications: Principal | ICD-10-CM | POA: Diagnosis present

## 2010-12-16 LAB — POCT I-STAT, CHEM 8
Calcium, Ion: 1.15 mmol/L (ref 1.12–1.32)
HCT: 42 % (ref 36.0–46.0)
TCO2: 23 mmol/L (ref 0–100)

## 2010-12-16 LAB — GLUCOSE, CAPILLARY
Glucose-Capillary: 596 mg/dL (ref 70–99)
Glucose-Capillary: 431 mg/dL — ABNORMAL HIGH (ref 70–99)
Glucose-Capillary: 333 mg/dL — ABNORMAL HIGH (ref 70–99)
Glucose-Capillary: 185 mg/dL — ABNORMAL HIGH (ref 70–99)

## 2010-12-16 LAB — URINALYSIS, ROUTINE W REFLEX MICROSCOPIC
Bilirubin Urine: NEGATIVE
Glucose, UA: >1000 mg/dL — AB
Hgb urine dipstick: NEGATIVE
Nitrite: NEGATIVE
Specific Gravity, Urine: 1.033 — ABNORMAL HIGH (ref 1.005–1.030)
pH: 5 (ref 5.0–8.0)

## 2010-12-16 LAB — DIFFERENTIAL
Basophils Absolute: 0 10*3/uL (ref 0.0–0.1)
Eosinophils Absolute: 0.2 10*3/uL (ref 0.0–0.7)
Eosinophils Relative: 2 % (ref 0–5)
Lymphs Abs: 2.2 10*3/uL (ref 0.7–4.0)

## 2010-12-16 LAB — WET PREP, GENITAL: Clue Cells Wet Prep HPF POC: NONE SEEN

## 2010-12-16 LAB — BASIC METABOLIC PANEL
Calcium: 9.1 mg/dL (ref 8.4–10.5)
Chloride: 101 mEq/L (ref 96–112)
Creatinine, Ser: 0.71 mg/dL (ref 0.4–1.2)
GFR calc Af Amer: >60 mL/min (ref >60)

## 2010-12-16 LAB — CBC
MCV: 84.8 fL (ref 78.0–100.0)
Platelets: 244 10*3/uL (ref 150–400)
RDW: 12.6 % (ref 11.5–15.5)
WBC: 7.2 10*3/uL (ref 4.0–10.5)

## 2010-12-16 LAB — POTASSIUM: Potassium: 3.6 mEq/L (ref 3.5–5.1)

## 2011-01-01 NOTE — H&P (Signed)
NAMEKATTIA, SELLEY              ACCOUNT NO.:  [id number]   MEDICAL RECORD NO.:  [id number]          PATIENT TYPE:  INP   LOCATION:  0108                         FACILITY:  [facility]   PHYSICIAN:  Estelle Grumbles, MDDATE OF BIRTH:  1986/07/10   DATE OF ADMISSION:  06/22/2008  DATE OF DISCHARGE:                              HISTORY & PHYSICAL   PRIMARY CARE PHYSICIAN:  None.   CHIEF COMPLAINT:  Flu-like symptoms.   HISTORY OF PRESENT ILLNESS:  Ms. Chelsea Aus is a 25 year old female who  presented with 2 weeks' history of headache, dizziness, nausea,  vomiting, diarrhea and multiple generalized complaints.  Patient states  that she started feeling sick 2 weeks ago.  Initially, she thought she  got the flu, so she took some Theraflu with some relief.  However, she  continued to get worse, so she decided to come to the emergency room.  She states she was feeling feverish, having headache, dizziness, feeling  thirsty a lot, nausea and vomiting for the past 2 weeks.  She was also  having decreased vision.  She feels it is more blurry.  She is feeling  thirsty a lot, feeling nauseous.  She is only able to tolerate liquids,  unable to tolerate any solid diet.  She was also having generalized body  aches, really tired, had a few episodes of burning sensation in the  chest when she was having nausea.  Sometimes, she also felt some  dyspnea.  She was having some abdominal discomfort, especially in the  lower pelvic region, has frequent urination.  She says she is urinating  a lot.  She also had loose bowel movements.  Yesterday, she noticed some  blood in her stool.  She is feeling weak, denies having any tingling or  numbness.  Other review of systems was negative.  She denies having any  prior episodes.   PAST MEDICAL HISTORY:  None.   PAST SURGICAL HISTORY:  She had cesarean section, otherwise none.   SOCIAL HISTORY:  Denies smoking, alcohol or drug use.  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   HOME MEDICATIONS:  She takes Tylenol as needed, otherwise none.   ALLERGIES:  None.   FAMILY HISTORY:  Diabetes runs in her family.  Her grandmother passed  away with kidney failure secondary to the diabetes.  Her mother has  glaucoma.   REVIEW OF SYSTEMS:  As mentioned in history of present illness,  otherwise negative.   PHYSICAL EXAMINATION:  GENERAL:  The patient is alert, awake, oriented  x4, still complaining of feeling sick.  She feels a little better since  she has been in emergency room and with IV fluids.  VITAL SIGNS:  Temperature 99.8, pulse 115, respirations 20, blood  pressure 148/97, O2 saturation 100% on room air.  HEENT:  Head normocephalic, atraumatic.  Eyes:  Pupils are equal, round,  reactive to light and accommodation.  No icterus was noted.  No  significant retinopathy was also noted by retinal exam.  NECK:  Supple, no JVD.  CHEST:  Bilateral air entry, no crackles or rales.  HEART:  S1, S2, regular rate and rhythm, no murmurs noted.  ABDOMEN:  Soft, bowel sounds positive, nontender, nondistended, no  guarding or rigidity.  EXTREMITIES:  No edema.  CENTRAL NERVOUS SYSTEM:  No focal motor neuro deficits.   LABORATORIES:  White count 12.6, hemoglobin 16.6, hematocrit 50,  platelets 344.  Urine:  Negative for leukocyte esterase and nitrites,  positive proteinuria, positive ketonuria.  Sodium 128, potassium 4.8,  chloride 88, bicarb 14, BUN 21, creatinine 1.75, glucose level 783,  calcium 10.1.  Chest x-ray:  No acute process radiographically.   IMPRESSION:  1. Diabetic ketoacidosis.  2. Acute renal failure.  3. Leukocytosis likely secondary to the diabetic ketoacidosis,      reactive.   PLAN:  1. We will admit the patient to telemetry unit.  Continue the insulin      drip.  Follow the Glucometer q.1 h.  Adjust the insulin drip      according to the glucose stabilizer guidelines.  Follow up the      basic metabolic panel q.4 h.  Continue the IV  insulin until the      acidosis resolves.  Subsequently start subcutaneous insulin once      the diabetic ketoacidosis resolves.  2. We will continue the IV fluid hydration, normal saline with 20 mEq      of K-Dur at the rate of 150 mL per hour.  Follow glucose levels q.1      h.  Continue the antiemetics and pain medication as needed.      Patient needs diabetic education and diet counseling.  Patient does      not have any insurance, so we will ask for case manager to follow      up the patient to help the patient with outpatient followup and      medical care.  Patient is agreeable to this current plan.      Estelle Grumbles, MD  Electronically Signed     TP/MEDQ  D:  06/22/2008  T:  06/22/2008  Job:  [id number]

## 2011-01-01 NOTE — Discharge Summary (Signed)
[name], SWALLOWS              ACCOUNT NO.:  [id number]   MEDICAL RECORD NO.:  [id number]          PATIENT TYPE:  INP   LOCATION:  1433                         [facility]ACILITY:  [facility]   PHYSICIAN:  Michelene Gardener, MD    DATE OF BIRTH:  08-Dec-1985   DATE OF ADMISSION:  06/22/2008  DATE OF DISCHARGE:  06/24/2008                               DISCHARGE SUMMARY   DISCHARGE DIAGNOSES:  1. Diabetic ketoacidosis.  2. Dehydration.  3. Hyperlipidemia.  4. Metabolic acidosis secondary to diabetic ketoacidosis.  5. Electrolyte dysfunction secondary to diabetic ketoacidosis.   DISCHARGE MEDICATIONS:  1. Lantus insulin 25 units subcutaneously at bedtime.  2. NovoLog insulin 5 units subcutaneously 3 times daily with meals.  3. Zocor 20 mg p.o. at bedtime.   CONSULTATIONS:  Diabetic coordinator consult.   PROCEDURES:  None.   RADIOLOGY STUDIES:  1. Chest x-ray on November 4 showed no acute abnormalities.  2. CT scan of the head without contrast on November 5 showed no acute      abnormalities.   COURSE OF HOSPITALIZATION:  1. Diabetic ketoacidosis.  This is a new diagnosis of diabetes      mellitus.  The patient had presented with symptoms suggestive for      diabetes.  She was found to be in acute diabetic ketoacidosis.  The      patient was admitted to the hospital.  She was started on insulin      drip via protocol.  She was also started on IV fluids and her      electrolytes have been monitored and managed.  The patient improved      very well.  At the time of admission she was acidotic with bicarb      of 14, anion gap of 24.  The patient responded very well.  At time      of discharge last bicarb is 21 and her anion gap is 7.  Last      recorded CBGs were 177, 128 and 175.  After discontinuation of her      insulin drip the patient was switched to Lantus.  Case was      discussed with diabetic coordinator and her estimated Lantus needs      will be around 25 units with short acting  NovoLog.  I will      discharge her on 25 units of lantus at bedtime and 5 units of      NovoLog to be taken with meals.  I also instructed her to buy a      glucometer and to record her CBGs and to take that to her primary      doctor for further adjustment of her medications.  Her hemoglobin      A1c was 11.4 which indicates poor control of her diabetes.  Most      likely her diabetes is type 1 and will require insulin.   The patient had electrolyte abnormalities which are related to her  diabetic ketoacidosis.  On admission her sodium was on the low side and  that was corrected with  correction of her sugars.  She also had  metabolic acidosis which again was corrected with correction of her  sugars.   DISPOSITION:  Otherwise the patient remained stable.  She was in very  stable condition at the time of discharge.  A prescription was given for  medications and for glucometers.   ASSESSMENT TIME:  40 minutes.      Michelene Gardener, MD  Electronically Signed     NAE/MEDQ  D:  06/24/2008  T:  06/24/2008  Job:  [id number]

## 2011-01-04 NOTE — Discharge Summary (Signed)
[name], [name]              ACCOUNT NO.:  [id number]   MEDICAL RECORD NO.:  [id number]          PATIENT TYPE:  INP   LOCATION:  9105                          FACILITY:  [facility]   PHYSICIAN:  Kathreen Cosier, M.D.DATE OF BIRTH:  03-11-1986   DATE OF ADMISSION:  10/08/2004  DATE OF DISCHARGE:                                 DISCHARGE SUMMARY   HOSPITAL COURSE:  The patient is an 25 year old primigravida with EDC October 19, 2004 who came into the hospital on February 20 complaining of  contractions. They were irregular and initially her tracing was reactive.  However, the tracing became abnormal with multiple repetitive variable  decelerations and a baseline shift to 160-170. Biophysical profile showed a  nuchal cord so it was decided she would be admitted for delivery. Her cervix  was 1 cm, 70%, vertex, -3. Membranes ruptured, IUPC inserted, the patient  received Pitocin stimulation, and eventually reached 7 cm with the vertex at  a 0 station. However, she started having prolonged variable decelerations  and it was decided she would be delivered by C-section because of  nonreassuring fetal heart rate tracing. At the time of C-section she had a  female, Apgar 8 and 9, cord pH 7.27. There were nuchal cords x2 very tight.  There also appeared to be a small abruption. The baby weighed 5 pounds 8  ounces. The blood loss was 600 mL. On admission her hemoglobin was 11.3 and  platelets 237, white count 10.2. Post delivery her hemoglobin was 8.8 and  she was asymptomatic. The patient developed a temperature of 101 on the  morning of day #2 and was restarted on ampicillin and gentamycin IV. She  rapidly defervesced and was discharged home on postoperative day #3  ambulatory, on a regular diet, on ampicillin 500 mg q.6h. for 5 days and  Tylox for pain, and Depo-Provera 150 mg IM for contraception.   DISCHARGE DIAGNOSIS:  Status post primary low transverse cesarean section at  term for  nonreassuring fetal heart rate tracing.      BAM/MEDQ  D:  10/12/2004  T:  10/12/2004  Job:  [id number]

## 2011-01-04 NOTE — H&P (Signed)
[name], [name]              ACCOUNT NO.:  [id number]   MEDICAL RECORD NO.:  [id number]          PATIENT TYPE:  INP   LOCATION:                                FACILITY:  [facility]   PHYSICIAN:  Kathreen Cosier, M.D.DATE OF BIRTH:  [birth year]   DATE OF ADMISSION:  02/21/[name]  DATE OF DISCHARGE:                                HISTORY & PHYSICAL   [age]-year-old primigravida [name], [name] negative GBS came to the  emergency room contracting in the middle of the night on February 20, [name].  Contractions were irregular and the tracing was at first reactive and then  she had a baseline shift to 160-170 with repetitive variable decelerations.  Biophysical profile was 8/8 and showed a nuchal cord so she was brought in  for delivery.  Cervix was 1 cm, 70%, vertex, -Garner.  At 6:40 a.m. her membranes  were ruptured, fluid was clear, IUPC was inserted and she was started on  Pitocin __________ .  Tracing remained reactive throughout the course of the  day and by 11:40 a.m. she had a reactive tracing.  By 4:50 p.m. she had some  deep variables and a baseline shift, was started on amnioinfusion, her  baseline at 4:50 was 110-120 with occasional variables, cervix was 5 cm,  90%, vertex, 0 station.  At 6 p.m. she was 7 cm but with persistent  variables with a decreased baseline and it was decided she would be  delivered via C-section for nonreassuring fetal heart rate tracing.  She was  on oxygen 10 L and she was given betamethasone prior to going back to the  operating room.  Physical exam revealed a well-developed female in labor.  HEENT negative.  Lungs clear.  Heart regular rhythm; no murmurs, no gallops.  Breasts no masses.  Abdomen term size, estimated fetal weight was 6 pounds 8  ounces.  Pelvic as described above.  Extremities negative.      BAM/MEDQ  D:  02/21/[name]  T:  02/21/[name]  Job:  [id number]

## 2011-01-04 NOTE — H&P (Signed)
NAME:  LAYNIE, ESPY                        ACCOUNT NO.:  [id number]   MEDICAL RECORD NO.:  [id number]                   PATIENT TYPE:  OBV   LOCATION:  6124                                 FACILITY:  [facility]   PHYSICIAN:  Ollen Gross. Vernell Morgans, M.D.              DATE OF BIRTH:  10-10-1985   DATE OF ADMISSION:  09/14/2003  DATE OF DISCHARGE:                                HISTORY & PHYSICAL   Ms. Chelsea Aus is a 25 year old black female who apparently presents tonight  with a two week history of suprapubic and right lower quadrant pain.  She  denies any real fevers during this period of time.  She has only had one to  two episodes of vomiting in the last couple of weeks.  She actually has just  finished eating a chicken sandwich because she was hungry and had no  problems with that.  She was recently seen in the emergency department and  diagnosed with both a urinary tract infection and pelvic inflammatory  disease for which she was prescribed antibiotics which she just started  yesterday.  In the emergency department tonight she has had no fevers and  does not appear to look toxic in any way.  She moves around the room and  moves around in bed very easily.  She otherwise denies nausea, vomiting,  fevers, chills, chest pain, shortness of breath, diarrhea or dysuria.  The  rest of her review of systems is unremarkable.   PAST MEDICAL HISTORY:  Significant for:  1. Urinary tract infection.  2. Pelvic inflammatory disease.  3. Broken arm.   PAST SURGICAL HISTORY:  None.   MEDICATIONS:  Include Cipro, Flagyl and doxycycline.   ALLERGIES:  No known drug allergies.   SOCIAL HISTORY:  She denies use of tobacco or alcohol products.   FAMILY HISTORY:  Noncontributory.   PHYSICAL EXAMINATION:  TEMPERATURE:  97.4.  BLOOD PRESSURE:  124/76.  PULSE:  66.  GENERAL:  She is a well-developed, well-nourished, young black female in no  acute distress.  SKIN:  Warm and dry with no jaundice.  EYES:   Extraocular muscles are intact.  Pupils equal, round and reactive to  light.  Sclerae are nonicteric.  LUNGS:  Clear bilaterally with no use of accessory respiratory muscles.  HEART:  Regular rate and rhythm with __________ pulse in left chest.  ABDOMEN:  Soft with some moderate tenderness in the suprapubic right lower  quadrant region, but no evidence of peritonitis.  No palpable masses or  hepatosplenomegaly.  EXTREMITIES:  No cyanosis, clubbing or edema.  PSYCHOLOGIC:  She is alert and oriented times three with no state of anxiety  or depression.   LABORATORIES:  Her white count was normal at 7000.  On review of her CT  scan, the CT showed an appendix that was the upper limits of normal in size.  There were no inflammatory changes around the appendix and  the appendix had  air in it.   ASSESSMENT AND PLAN:  This is a 25 year old black female with right lower  quadrant pain that certainly can be worrisome for appendicitis, although she  has no other corroborating signs of appendicitis including no fevers, normal  white count and a CT scan that does not show an obvious appendicitis.  She  also certainly could have evidence of urinary tract infection and pelvic  inflammatory disease still since she has only had one days worth of  antibiotics.  We will plan to admit her overnight for observation and IV  antibiotics to cover urinary tract infection and pelvic infection.  We will  recheck her white count and reexamine her in the morning.                                                Ollen Gross. Vernell Morgans, M.D.    PST/MEDQ  D:  09/14/2003  T:  09/15/2003  Job:  [id number]

## 2011-01-04 NOTE — Op Note (Signed)
NAMETERRIE, HARING              ACCOUNT NO.:  [id number]   MEDICAL RECORD NO.:  [id number]          PATIENT TYPE:  INP   LOCATION:  9105                          FACILITY:  [facility]   PHYSICIAN:  Kathreen Cosier, M.D.DATE OF BIRTH:  [birth year]   DATE OF PROCEDURE:  10/09/2004  DATE OF DISCHARGE:                                 OPERATIVE REPORT   PREOPERATIVE DIAGNOSIS:  Nonreassuring fetal heart rate tracing.   ANESTHESIA:  Epidural.   DESCRIPTION OF PROCEDURE:  The patient was placed on the operating table in  the supine position.  Abdomen was prepped and draped.  The bladder was  emptied with a Foley catheter.  Transverse suprapubic incision was made and  carried down to the rectus fascia.  The fascia cleaned and incised the  length of the incision.  The rectus muscles retracted laterally.  Peritoneum  incised longitudinally.  A transverse incision made in the visceroperitoneum  above the bladder and the bladder mobilized inferiorly.  A transverse lower  uterine incision made.  The patient delivered from the OP position of a  female, Apgars 8 and 9.  The team was in attendance.  The pH was 7.27.  There  was a tight nuchal cord x2.  The vertex was delivered with a vacuum.  The  placenta was anterior and appeared to be a small abruption.  The placenta  sent to pathology.  The placenta removed manually and uterine cavity cleaned  with dry laps.  The baby weighed 5 pounds 8 ounces.  The uterus was cleaned  with dry laps.  The uterine incision was closed in one layer with continuous  suture of #1 chromic.  Hemostasis was satisfactory.  The bladder flap  reattached with 2-0 chromic.  The uterus was well contracted.  The tubes and  ovaries normal.  Abdomen closed in layers.  The peritoneum with continuous  suture of 0 chromic, the fascia with continuous suture of 0 Dexon, the skin  closed with subcuticular stitch of 4-0 Monocryl.  Estimated blood loss was  600 mL.      BAM/MEDQ  D:   10/09/2004  T:  10/09/2004  Job:  [id number]

## 2011-01-04 NOTE — Discharge Summary (Signed)
[name], [name]              ACCOUNT NO.:  [id number]   MEDICAL RECORD NO.:  [id number]          PATIENT TYPE:  INP   LOCATION:  9161                          FACILITY:  [facility]   PHYSICIAN:  Kathreen Cosier, M.D.DATE OF BIRTH:  [birth year]   DATE OF ADMISSION:  09/10/2004  DATE OF DISCHARGE:  09/12/2004                                 DISCHARGE SUMMARY   HISTORY OF PRESENT ILLNESS:  The patient is a 25 year old primigravida, EDC  October 19, 2004, who was admitted with a urinary tract infection and a  temperature of 102, and was admitted and placed on ampicillin 2 g IV q.6h.  On admission, her hemoglobin was 10.1, white count 8,000, platelets 207,000.  Sodium 133, potassium 3.4, chloride 104, glucose 182, bilirubin 0.6.  Blood  cultures were negative.  Chest x-ray was also negative.  The patient rapidly  defervesced, and by September 12, 2004, had been well of a fever over 24  hours.  She was discharged on ampicillin 500 mg p.o. q.6h. x10 days.   DISCHARGE DIAGNOSES:  Status post IUP plus urinary tract infection.      BAM/MEDQ  D:  11/14/2004  T:  11/14/2004  Job:  [id number]

## 2011-04-24 ENCOUNTER — Inpatient Hospital Stay (HOSPITAL_COMMUNITY)
Admission: EM | Admit: 2011-04-24 | Discharge: 2011-04-27 | DRG: 295 | Disposition: A | Discharge status: Home / Self Care | Payer: BC Managed Care – PPO | Attending: Internal Medicine | Admitting: Internal Medicine

## 2011-04-24 ENCOUNTER — Emergency Department (HOSPITAL_COMMUNITY): Payer: BC Managed Care – PPO

## 2011-04-24 DIAGNOSIS — E870 Hyperosmolality and hypernatremia: Principal | ICD-10-CM | POA: Diagnosis present

## 2011-04-24 DIAGNOSIS — Z794 Long term (current) use of insulin: Secondary | ICD-10-CM

## 2011-04-24 DIAGNOSIS — E785 Hyperlipidemia, unspecified: Secondary | ICD-10-CM | POA: Diagnosis present

## 2011-04-24 DIAGNOSIS — R112 Nausea with vomiting, unspecified: Secondary | ICD-10-CM | POA: Diagnosis present

## 2011-04-24 DIAGNOSIS — R109 Unspecified abdominal pain: Secondary | ICD-10-CM | POA: Diagnosis present

## 2011-04-24 DIAGNOSIS — E1069 Type 1 diabetes mellitus with other specified complication: Principal | ICD-10-CM | POA: Diagnosis present

## 2011-04-24 DIAGNOSIS — Z9641 Presence of insulin pump (external) (internal): Secondary | ICD-10-CM

## 2011-04-24 DIAGNOSIS — E1065 Type 1 diabetes mellitus with hyperglycemia: Principal | ICD-10-CM | POA: Diagnosis present

## 2011-04-24 DIAGNOSIS — E876 Hypokalemia: Secondary | ICD-10-CM | POA: Diagnosis present

## 2011-04-24 LAB — URINALYSIS, ROUTINE W REFLEX MICROSCOPIC
Bilirubin Urine: NEGATIVE
Hgb urine dipstick: NEGATIVE
Nitrite: NEGATIVE
Specific Gravity, Urine: 1.037 — ABNORMAL HIGH (ref 1.005–1.030)
pH: 6 (ref 5.0–8.0)

## 2011-04-24 LAB — GLUCOSE, CAPILLARY
Glucose-Capillary: 488 mg/dL — ABNORMAL HIGH (ref 70–99)
Glucose-Capillary: 425 mg/dL — ABNORMAL HIGH (ref 70–99)
Glucose-Capillary: 176 mg/dL — ABNORMAL HIGH (ref 70–99)

## 2011-04-24 LAB — BLOOD GAS, VENOUS
Drawn by: 268
Patient temperature: 98.6
TCO2: 25.8 mmol/L (ref 0–100)
pCO2, Ven: 52.9 mmHg — ABNORMAL HIGH (ref 45.0–50.0)
pH, Ven: 7.361 — ABNORMAL HIGH (ref 7.250–7.300)

## 2011-04-24 LAB — DIFFERENTIAL
Lymphocytes Relative: 36 % (ref 12–46)
Lymphs Abs: 3.2 10*3/uL (ref 0.7–4.0)
Neutro Abs: 4.9 10*3/uL (ref 1.7–7.7)
Neutrophils Relative %: 56 % (ref 43–77)

## 2011-04-24 LAB — POCT I-STAT, CHEM 8
Chloride: 94 mEq/L — ABNORMAL LOW (ref 96–112)
HCT: 51 % — ABNORMAL HIGH (ref 36.0–46.0)
Potassium: 4.2 mEq/L (ref 3.5–5.1)
Sodium: 130 mEq/L — ABNORMAL LOW (ref 135–145)

## 2011-04-24 LAB — CBC
HCT: 42.9 % (ref 36.0–46.0)
MCV: 84.4 fL (ref 78.0–100.0)
Platelets: 344 10*3/uL (ref 150–400)
RBC: 5.08 MIL/uL (ref 3.87–5.11)
WBC: 8.7 10*3/uL (ref 4.0–10.5)

## 2011-04-24 LAB — BASIC METABOLIC PANEL
CO2: 25 mEq/L (ref 19–32)
Calcium: 10.9 mg/dL — ABNORMAL HIGH (ref 8.4–10.5)
GFR calc Af Amer: >60 mL/min (ref >60)
Sodium: 130 mEq/L — ABNORMAL LOW (ref 135–145)

## 2011-04-24 LAB — URINE MICROSCOPIC-ADD ON

## 2011-04-25 ENCOUNTER — Inpatient Hospital Stay (HOSPITAL_COMMUNITY): Payer: BC Managed Care – PPO

## 2011-04-25 LAB — CBC
HCT: 36.1 % (ref 36.0–46.0)
Hemoglobin: 12.5 g/dL (ref 12.0–15.0)
MCH: 29.6 pg (ref 26.0–34.0)
MCHC: 34.6 g/dL (ref 30.0–36.0)
MCV: 85.5 fL (ref 78.0–100.0)
RBC: 4.22 MIL/uL (ref 3.87–5.11)

## 2011-04-25 LAB — COMPREHENSIVE METABOLIC PANEL
ALT: 7 U/L (ref 0–35)
AST: 11 U/L (ref 0–37)
Alkaline Phosphatase: 59 U/L (ref 39–117)
CO2: 28 mEq/L (ref 19–32)
Chloride: 100 mEq/L (ref 96–112)
GFR calc Af Amer: >60 mL/min (ref >60)
GFR calc non Af Amer: >60 mL/min (ref >60)
Glucose, Bld: 84 mg/dL (ref 70–99)
Potassium: 3.1 mEq/L — ABNORMAL LOW (ref 3.5–5.1)
Sodium: 137 mEq/L (ref 135–145)

## 2011-04-25 LAB — LIPID PANEL
HDL: 39 mg/dL — ABNORMAL LOW (ref >39)
LDL Cholesterol: 208 mg/dL — ABNORMAL HIGH (ref 0–99)

## 2011-04-25 LAB — GLUCOSE, CAPILLARY
Glucose-Capillary: 176 mg/dL — ABNORMAL HIGH (ref 70–99)
Glucose-Capillary: 189 mg/dL — ABNORMAL HIGH (ref 70–99)
Glucose-Capillary: 155 mg/dL — ABNORMAL HIGH (ref 70–99)
Glucose-Capillary: 370 mg/dL — ABNORMAL HIGH (ref 70–99)

## 2011-04-25 LAB — DIFFERENTIAL
Basophils Relative: 1 % (ref 0–1)
Lymphocytes Relative: 57 % — ABNORMAL HIGH (ref 12–46)
Lymphs Abs: 3.8 10*3/uL (ref 0.7–4.0)
Monocytes Absolute: 0.7 10*3/uL (ref 0.1–1.0)
Monocytes Relative: 10 % (ref 3–12)
Neutro Abs: 1.9 10*3/uL (ref 1.7–7.7)
Neutrophils Relative %: 29 % — ABNORMAL LOW (ref 43–77)

## 2011-04-25 LAB — PREGNANCY, URINE: Preg Test, Ur: NEGATIVE

## 2011-04-25 LAB — HEMOGLOBIN A1C
Hgb A1c MFr Bld: 15.4 % — ABNORMAL HIGH (ref <5.7)
Mean Plasma Glucose: 395 mg/dL — ABNORMAL HIGH (ref <117)

## 2011-04-25 LAB — CARDIAC PANEL(CRET KIN+CKTOT+MB+TROPI)
Relative Index: INVALID (ref 0.0–2.5)
Relative Index: INVALID (ref 0.0–2.5)
Relative Index: INVALID (ref 0.0–2.5)
Total CK: 43 U/L (ref 7–177)

## 2011-04-25 LAB — PHOSPHORUS: Phosphorus: 3.2 mg/dL (ref 2.3–4.6)

## 2011-04-25 LAB — HEPATIC FUNCTION PANEL
ALT: 7 U/L (ref 0–35)
Bilirubin, Direct: 0.1 mg/dL (ref 0.0–0.3)
Indirect Bilirubin: 0.2 mg/dL — ABNORMAL LOW (ref 0.3–0.9)
Total Bilirubin: 0.3 mg/dL (ref 0.3–1.2)

## 2011-04-25 LAB — MRSA PCR SCREENING: MRSA by PCR: NEGATIVE

## 2011-04-25 LAB — PRO B NATRIURETIC PEPTIDE: Pro B Natriuretic peptide (BNP): 5 pg/mL (ref 0–125)

## 2011-04-26 LAB — BASIC METABOLIC PANEL
BUN: 5 mg/dL — ABNORMAL LOW (ref 6–23)
CO2: 24 mEq/L (ref 19–32)
Calcium: 8.2 mg/dL — ABNORMAL LOW (ref 8.4–10.5)
Chloride: 105 mEq/L (ref 96–112)
Creatinine, Ser: <0.47 mg/dL — ABNORMAL LOW (ref 0.50–1.10)
Glucose, Bld: 236 mg/dL — ABNORMAL HIGH (ref 70–99)
Potassium: 3.5 mEq/L (ref 3.5–5.1)
Sodium: 136 mEq/L (ref 135–145)

## 2011-04-26 LAB — GLUCOSE, CAPILLARY
Glucose-Capillary: 141 mg/dL — ABNORMAL HIGH (ref 70–99)
Glucose-Capillary: 136 mg/dL — ABNORMAL HIGH (ref 70–99)
Glucose-Capillary: 179 mg/dL — ABNORMAL HIGH (ref 70–99)
Glucose-Capillary: 185 mg/dL — ABNORMAL HIGH (ref 70–99)
Glucose-Capillary: 151 mg/dL — ABNORMAL HIGH (ref 70–99)
Glucose-Capillary: 236 mg/dL — ABNORMAL HIGH (ref 70–99)
Glucose-Capillary: 285 mg/dL — ABNORMAL HIGH (ref 70–99)
Glucose-Capillary: 82 mg/dL (ref 70–99)

## 2011-04-27 LAB — GLUCOSE, CAPILLARY
Glucose-Capillary: 115 mg/dL — ABNORMAL HIGH (ref 70–99)
Glucose-Capillary: 64 mg/dL — ABNORMAL LOW (ref 70–99)

## 2011-04-29 LAB — MISCELLANEOUS TEST

## 2011-04-29 NOTE — Discharge Summary (Signed)
  NAME:  [name], [name]      ACCOUNT NO.:  [id number]  MEDICAL RECORD NO.:  [id number]  LOCATION:  1312                         FACILITY:  [facility]  PHYSICIAN:  Brendia Sacks, MD    DATE OF BIRTH:  [birth year]  DATE OF ADMISSION:  04/24/2011 DATE OF DISCHARGE:  04/27/2011                              DISCHARGE SUMMARY   PRIMARY CARE PHYSICIAN:  Dorisann Frames, MD  CONDITION ON DISCHARGE:  Improved.  DISPOSITION:  Home.  DISCHARGE DIAGNOSES: 1. Uncontrolled diabetes mellitus type 1 with severe hyperglycemia. 2. Nausea, vomiting, abdominal pain secondary to the above, resolved. 3. Hyperlipidemia.  HISTORY OF PRESENT ILLNESS:  This is a 25 year old woman who came to the emergency room with elevated blood sugars and was admitted for further evaluation and treatment.  She is also noted to have nausea, vomiting, and abdominal pain.  HOSPITAL COURSE:  Ms. McAdoo-Collier was admitted initially to step-down and placed on insulin infusion.  Her blood sugars quickly improved and she was transitioned temporarily to Lantus injections because her pump was not present.  In discussion with the patient, she wanted to resume her pump and did not want to start on injections.  Her husband subsequently brought her pump, the inpatient diabetic coordinator assisted with management of her pump and her blood sugars have been better controlled on the pump with the exception of one episode of hypoglycemia because she missed a snack.  She is feeling quite well. Her nausea and vomiting and abdominal pain have resolved and she is now stable for discharge.  CONSULTATIONS:  None.  PROCEDURES:  None.  IMAGING:  Chest x-ray, September 5:  No active lung disease.  PERTINENT LABORATORY STUDIES: 1. Hemoglobin A1c was 15.42. 2. Urine pregnancy was negative. 3. Lipid profile was notable for a total cholesterol of 289, lipids of     210, HDL of 39 and LDL of 208. 4. CBC was unremarkable. 5. Basic  metabolic panel was essentially unremarkable. 6. Hepatic function panel unremarkable. 7. Cardiac enzymes were negative.  Exam on discharge, she appears well.  Vitals documented in the progress note. Cardiovascular, regular rate and rhythm.  No murmur, rub, or gallop.  Respiratory, clear to auscultation bilaterally.  No wheezes, rales, or rhonchi.  She appears well.  DISCHARGE INSTRUCTIONS:  The patient will be discharged home today.  DIET:  Diabetic diet.  ACTIVITIES:  Unrestricted.  FOLLOWUP:  With Dr. Talmage Nap in approximately 1 week.  DISCHARGE MEDICATIONS: 1. Continue to use insulin pump as recommended by Dr. Talmage Nap. 2. Tylenol PM 2 tablets p.o. q.h.s. as needed for sleep or pain. 3. Discontinue NovoLog injections.  Time coordinating discharge is 20 minutes.     Brendia Sacks, MD     DG/MEDQ  D:  04/27/2011  T:  04/27/2011  Job:  [id number]  cc:   Dorisann Frames, M.D. Fax: [phone]  Electronically Signed by Brendia Sacks  on 04/29/2011 09:48:05 PM

## 2011-05-21 LAB — URINALYSIS, ROUTINE W REFLEX MICROSCOPIC
Bilirubin Urine: NEGATIVE
Bilirubin Urine: NEGATIVE
Glucose, UA: >1000 — AB
Hgb urine dipstick: NEGATIVE
Ketones, ur: NEGATIVE mg/dL
Nitrite: NEGATIVE
Protein, ur: NEGATIVE mg/dL
Specific Gravity, Urine: 1.034 — ABNORMAL HIGH
Specific Gravity, Urine: 1.04 — ABNORMAL HIGH
Urobilinogen, UA: 0.2 mg/dL (ref 0.0–1.0)
Urobilinogen, UA: 0.2
pH: 5

## 2011-05-21 LAB — CULTURE, BLOOD (ROUTINE X 2)

## 2011-05-21 LAB — COMPREHENSIVE METABOLIC PANEL
ALT: 19
AST: 17
Albumin: 4.8
Alkaline Phosphatase: 137 — ABNORMAL HIGH
CO2: 14 — ABNORMAL LOW
Chloride: 88 — ABNORMAL LOW
GFR calc Af Amer: 44 — ABNORMAL LOW
GFR calc non Af Amer: 37 — ABNORMAL LOW
Potassium: 4.8
Sodium: 128 — ABNORMAL LOW
Total Bilirubin: 2.1 — ABNORMAL HIGH

## 2011-05-21 LAB — GLUCOSE, CAPILLARY
Glucose-Capillary: 244 mg/dL — ABNORMAL HIGH (ref 70–99)
Glucose-Capillary: 342 mg/dL — ABNORMAL HIGH (ref 70–99)
Glucose-Capillary: 187 — ABNORMAL HIGH
Glucose-Capillary: 189 — ABNORMAL HIGH
Glucose-Capillary: 191 — ABNORMAL HIGH
Glucose-Capillary: 217 — ABNORMAL HIGH
Glucose-Capillary: 226 — ABNORMAL HIGH
Glucose-Capillary: 232 — ABNORMAL HIGH
Glucose-Capillary: 241 — ABNORMAL HIGH
Glucose-Capillary: 245 — ABNORMAL HIGH
Glucose-Capillary: 261 — ABNORMAL HIGH
Glucose-Capillary: 263 — ABNORMAL HIGH
Glucose-Capillary: 266 — ABNORMAL HIGH
Glucose-Capillary: 276 — ABNORMAL HIGH
Glucose-Capillary: 281 — ABNORMAL HIGH
Glucose-Capillary: 289 — ABNORMAL HIGH
Glucose-Capillary: 289 — ABNORMAL HIGH
Glucose-Capillary: 391 — ABNORMAL HIGH

## 2011-05-21 LAB — URINE MICROSCOPIC-ADD ON

## 2011-05-21 LAB — BASIC METABOLIC PANEL
BUN: 11
BUN: 11
BUN: 13
CO2: 26 mEq/L (ref 19–32)
CO2: 18 — ABNORMAL LOW
CO2: 20
CO2: 20
Calcium: 10 mg/dL (ref 8.4–10.5)
Calcium: 8.1 — ABNORMAL LOW
Calcium: 8.2 — ABNORMAL LOW
Calcium: 8.2 — ABNORMAL LOW
Calcium: 8.2 — ABNORMAL LOW
Calcium: 8.6
Chloride: 108
Chloride: 110
Chloride: 111
Chloride: 112
Chloride: 113 — ABNORMAL HIGH
Creatinine, Ser: 0.8 mg/dL (ref 0.4–1.2)
Creatinine, Ser: 0.71
Creatinine, Ser: 0.89
Creatinine, Ser: 0.9
Creatinine, Ser: 0.93
Creatinine, Ser: 1.02
GFR calc Af Amer: >60 mL/min (ref >60)
GFR calc Af Amer: >60
GFR calc Af Amer: >60
GFR calc Af Amer: >60
GFR calc Af Amer: >60
GFR calc Af Amer: >60
GFR calc Af Amer: >60
GFR calc Af Amer: >60
GFR calc Af Amer: >60
GFR calc Af Amer: >60
GFR calc non Af Amer: >60
GFR calc non Af Amer: >60
GFR calc non Af Amer: >60
GFR calc non Af Amer: >60
GFR calc non Af Amer: >60
GFR calc non Af Amer: >60
GFR calc non Af Amer: >60
GFR calc non Af Amer: >60
Glucose, Bld: 167 — ABNORMAL HIGH
Glucose, Bld: 244 — ABNORMAL HIGH
Glucose, Bld: 262 — ABNORMAL HIGH
Glucose, Bld: 313 — ABNORMAL HIGH
Potassium: 3.5
Potassium: 3.8
Potassium: 4
Potassium: 4.2
Sodium: 134 — ABNORMAL LOW
Sodium: 135
Sodium: 135
Sodium: 136
Sodium: 137
Sodium: 138
Sodium: 138

## 2011-05-21 LAB — POCT PREGNANCY, URINE: Preg Test, Ur: NEGATIVE

## 2011-05-21 LAB — CBC
HCT: 39
Hemoglobin: 13.2
MCHC: 33.1 g/dL (ref 30.0–36.0)
MCV: 91.6
Platelets: 344
RBC: 4.7 MIL/uL (ref 3.87–5.11)
RBC: 5.46 — ABNORMAL HIGH
RDW: 13.5 % (ref 11.5–15.5)
WBC: 12.6 — ABNORMAL HIGH
WBC: 5.2

## 2011-05-21 LAB — DIFFERENTIAL
Basophils Absolute: 0.1 10*3/uL (ref 0.0–0.1)
Basophils Absolute: 0
Basophils Relative: 2 % — ABNORMAL HIGH (ref 0–1)
Basophils Relative: 0
Eosinophils Absolute: 0
Eosinophils Relative: 0
Eosinophils Relative: 5
Lymphocytes Relative: 12
Lymphocytes Relative: 41
Lymphs Abs: 2.1
Monocytes Absolute: 0.2
Monocytes Absolute: 0.5
Monocytes Relative: 8 % (ref 3–12)
Neutro Abs: 2.4 10*3/uL (ref 1.7–7.7)
Neutrophils Relative %: 37 % — ABNORMAL LOW (ref 43–77)

## 2011-05-21 LAB — MAGNESIUM
Magnesium: 2.1
Magnesium: 2.1
Magnesium: 2.4

## 2011-05-21 LAB — LIPID PANEL
LDL Cholesterol: 210 — ABNORMAL HIGH
Total CHOL/HDL Ratio: 9.1
VLDL: 41 — ABNORMAL HIGH

## 2011-05-21 LAB — URINE CULTURE: Colony Count: 15000

## 2011-05-21 LAB — PHOSPHORUS: Phosphorus: 1.8 — ABNORMAL LOW

## 2011-05-21 LAB — HEMOGLOBIN A1C: Mean Plasma Glucose: 280

## 2011-05-23 LAB — GLUCOSE, CAPILLARY: Glucose-Capillary: 216 mg/dL — ABNORMAL HIGH (ref 70–99)

## 2011-05-27 NOTE — H&P (Signed)
NAME:  [name], NACHREINER      ACCOUNT NO.:  [id number]  MEDICAL RECORD NO.:  [id number]  LOCATION:  [facility]                         FACILITY:  [facility]  PHYSICIAN:  Richarda Overlie, MD       DATE OF BIRTH:  1985-12-18  DATE OF ADMISSION:  04/24/2011 DATE OF DISCHARGE:                             HISTORY & PHYSICAL   PRIMARY CARE PHYSICIAN:  [facility].  PRIMARY ENDOCRINOLOGIST:  Dorisann Frames, M.D.  CHIEF COMPLAINT:  Elevated sugars and not feeling well for 2 weeks.  SUBJECTIVE:  This is a 25 year old female who states that she has not been feeling well for the last 2 weeks.  She has had experienced intermittent nausea and vomiting for the last 2 days.  She also has postprandial nausea.  She also experienced polyuria, polydipsia.  Sugars have been elevated, but she denies any fever, chills, rigors, or cough. She states that today her sugar was unmeasurable and she went to [facility] because of generalized weakness, nausea, vomiting, and fatigue and was found to have some ketones in the urine and was referred to the ER for further evaluation.  She denies any chest pain, shortness of breath, orthopnea, paroxysmal nocturnal dyspnea, or dependent edema.  REVIEW OF SYSTEMS:  14-point review of systems was done as documented in the HPI.  PAST MEDICAL HISTORY:  History of insulin-dependent diabetes, on insulin pump.  PAST SURGICAL HISTORY:  Cesarean section.  FAMILY HISTORY:  She has a family history of diabetes.  Grandmother passed away from kidney failure secondary to diabetes.  Mother has glaucoma.  SOCIAL HISTORY:  She denies use of smoking, alcohol, tobacco use.  ALLERGIES:  No known drug allergies.  HOME MEDICATIONS: 1. Diflucan 150 mg p.o. daily for 7 days. 2. Insulin pump continuous. 3. NovoLog 10 units six times a day. 4. Tylenol PM.  PHYSICAL EXAMINATION:  VITAL SIGNS: Blood pressure 136/66, pulse of 84, respirations 16, temperature  98.2. GENERAL:  Comfortable currently in no acute cardiopulmonary distress. HEENT:  Pupils equal and reactive.  Extraocular movements intact. NECK:  Supple.  No JVD. LUNGS: Clear to auscultation bilaterally.  No wheezes, no crackles, no rhonchi. ABDOMEN:  Tender to palpation in suprapubic region.  Normoactive bowel sounds.  No rebound or guarding. EXTREMITIES:  Without cyanosis, clubbing, or edema. NEUROLOGIC:  Cranial nerves II-XII grossly intact. SKIN:  Without any skin rashes.  LABORATORY DATA:  Venous blood gas shows a pH of 7.36, pCO2 of 52.9, and pO2 of 23.1.  Urine microscopy showed 0-2 WBCs per high-power field. BMET; sodium 130, potassium 4.1, chloride 87, bicarb 25, glucose 504, BUN 11, creatinine 0.66, calcium 10.9.  Urinalysis negative.  CBC; WBC 8.7, hemoglobin 15.5, hematocrit 42.9, and a platelet count of 344.  ASSESSMENT AND/PLAN: 1. Hyperosmolar hyperglycemic state without any evidence of diabetic     ketoacidosis. 2. Hyponatremia likely secondary to dilutional hyponatremia likely     secondary to hypoglycemia. 3. Nausea, vomiting.  Could be secondary to gastroparesis versus viral     gastroenteritis.  PLAN:  The patient will be admitted to step-down because she will be placed on a Glucommander drip.  We will hydrate her aggressively with IV fluids.  We will do q.1 hour CBGs to  achieve adequate glycemic control, insulin pump has been turned off.  She will be kept n.p.o. until her sugars are stabilized.  I do not see any evidence of any underlying infection.  Her abdominal pain will be evaluated further with liver function tests as well as lipase.  We will order a gastric emptying study for the morning if the patient is able to tolerate p.o.'s.  She is a full code.     Richarda Overlie, MD     NA/MEDQ  D:  04/24/2011  T:  04/24/2011  Job:  [id number]  Electronically Signed by Richarda Overlie MD on 05/27/2011 02:00:34 PM

## 2011-06-11 ENCOUNTER — Emergency Department (HOSPITAL_COMMUNITY)
Admission: EM | Admit: 2011-06-11 | Discharge: 2011-06-12 | Disposition: A | Discharge status: Home / Self Care | Payer: BC Managed Care – PPO | Attending: Emergency Medicine | Admitting: Emergency Medicine

## 2011-06-11 DIAGNOSIS — E119 Type 2 diabetes mellitus without complications: Secondary | ICD-10-CM | POA: Insufficient documentation

## 2011-06-11 DIAGNOSIS — R55 Syncope and collapse: Secondary | ICD-10-CM | POA: Insufficient documentation

## 2011-06-11 DIAGNOSIS — H538 Other visual disturbances: Secondary | ICD-10-CM | POA: Insufficient documentation

## 2011-06-11 DIAGNOSIS — R51 Headache: Secondary | ICD-10-CM | POA: Insufficient documentation

## 2011-06-11 LAB — URINE MICROSCOPIC-ADD ON

## 2011-06-11 LAB — DIFFERENTIAL
Eosinophils Absolute: 0.2 10*3/uL (ref 0.0–0.7)
Lymphocytes Relative: 45 % (ref 12–46)
Lymphs Abs: 4.1 10*3/uL — ABNORMAL HIGH (ref 0.7–4.0)
Monocytes Relative: 6 % (ref 3–12)
Neutrophils Relative %: 46 % (ref 43–77)

## 2011-06-11 LAB — URINALYSIS, ROUTINE W REFLEX MICROSCOPIC
Bilirubin Urine: NEGATIVE
Glucose, UA: >1000 mg/dL — AB
Hgb urine dipstick: NEGATIVE
Ketones, ur: NEGATIVE mg/dL
Protein, ur: NEGATIVE mg/dL
pH: 6.5 (ref 5.0–8.0)

## 2011-06-11 LAB — CBC
HCT: 37.4 % (ref 36.0–46.0)
Hemoglobin: 13.4 g/dL (ref 12.0–15.0)
MCH: 30.5 pg (ref 26.0–34.0)
MCV: 85.2 fL (ref 78.0–100.0)
Platelets: 362 10*3/uL (ref 150–400)
RBC: 4.39 MIL/uL (ref 3.87–5.11)

## 2011-06-11 LAB — BASIC METABOLIC PANEL
BUN: 8 mg/dL (ref 6–23)
CO2: 27 mEq/L (ref 19–32)
Calcium: 10.1 mg/dL (ref 8.4–10.5)
Chloride: 92 mEq/L — ABNORMAL LOW (ref 96–112)
Creatinine, Ser: 0.66 mg/dL (ref 0.50–1.10)
Glucose, Bld: 447 mg/dL — ABNORMAL HIGH (ref 70–99)

## 2011-06-12 LAB — GLUCOSE, CAPILLARY
Glucose-Capillary: 379 mg/dL — ABNORMAL HIGH (ref 70–99)
Glucose-Capillary: 265 mg/dL — ABNORMAL HIGH (ref 70–99)
Glucose-Capillary: 276 mg/dL — ABNORMAL HIGH (ref 70–99)

## 2011-07-02 ENCOUNTER — Encounter: Payer: Self-pay | Admitting: Emergency Medicine

## 2011-07-02 ENCOUNTER — Inpatient Hospital Stay (HOSPITAL_COMMUNITY)
Admission: EM | Admit: 2011-07-02 | Discharge: 2011-07-05 | DRG: 295 | Disposition: A | Discharge status: Home / Self Care | Payer: BC Managed Care – PPO | Attending: Internal Medicine | Admitting: Internal Medicine

## 2011-07-02 DIAGNOSIS — R739 Hyperglycemia, unspecified: Secondary | ICD-10-CM

## 2011-07-02 DIAGNOSIS — E1101 Type 2 diabetes mellitus with hyperosmolarity with coma: Principal | ICD-10-CM | POA: Diagnosis present

## 2011-07-02 DIAGNOSIS — E869 Volume depletion, unspecified: Secondary | ICD-10-CM | POA: Diagnosis present

## 2011-07-02 DIAGNOSIS — E871 Hypo-osmolality and hyponatremia: Secondary | ICD-10-CM | POA: Diagnosis present

## 2011-07-02 DIAGNOSIS — E876 Hypokalemia: Secondary | ICD-10-CM | POA: Diagnosis present

## 2011-07-02 DIAGNOSIS — I959 Hypotension, unspecified: Secondary | ICD-10-CM | POA: Diagnosis not present

## 2011-07-02 DIAGNOSIS — E1065 Type 1 diabetes mellitus with hyperglycemia: Secondary | ICD-10-CM | POA: Diagnosis present

## 2011-07-02 DIAGNOSIS — Z9641 Presence of insulin pump (external) (internal): Secondary | ICD-10-CM

## 2011-07-02 DIAGNOSIS — Z794 Long term (current) use of insulin: Secondary | ICD-10-CM

## 2011-07-02 LAB — DIFFERENTIAL
Basophils Absolute: 0.1 10*3/uL (ref 0.0–0.1)
Basophils Relative: 1 % (ref 0–1)
Eosinophils Absolute: 0.2 10*3/uL (ref 0.0–0.7)
Monocytes Absolute: 0.5 10*3/uL (ref 0.1–1.0)
Monocytes Relative: 6 % (ref 3–12)
Neutro Abs: 3.6 10*3/uL (ref 1.7–7.7)
Neutrophils Relative %: 44 % (ref 43–77)

## 2011-07-02 LAB — BASIC METABOLIC PANEL
BUN: 13 mg/dL (ref 6–23)
Calcium: 10.4 mg/dL (ref 8.4–10.5)
Creatinine, Ser: 0.59 mg/dL (ref 0.50–1.10)
GFR calc Af Amer: >90 mL/min (ref >90)
GFR calc non Af Amer: >90 mL/min (ref >90)
Glucose, Bld: 502 mg/dL — ABNORMAL HIGH (ref 70–99)

## 2011-07-02 LAB — CBC
MCH: 30.4 pg (ref 26.0–34.0)
MCHC: 35.9 g/dL (ref 30.0–36.0)
RDW: 12.3 % (ref 11.5–15.5)

## 2011-07-02 LAB — BLOOD GAS, VENOUS
Acid-Base Excess: 0.7 mmol/L (ref 0.0–2.0)
Bicarbonate: 25.5 mEq/L — ABNORMAL HIGH (ref 20.0–24.0)
FIO2: 0.21 %
pO2, Ven: 42.5 mmHg (ref 30.0–45.0)

## 2011-07-02 LAB — URINALYSIS, ROUTINE W REFLEX MICROSCOPIC
Bilirubin Urine: NEGATIVE
Hgb urine dipstick: NEGATIVE
Nitrite: NEGATIVE
Protein, ur: NEGATIVE mg/dL
Specific Gravity, Urine: 1.037 — ABNORMAL HIGH (ref 1.005–1.030)
Urobilinogen, UA: 0.2 mg/dL (ref 0.0–1.0)

## 2011-07-02 LAB — URINE MICROSCOPIC-ADD ON

## 2011-07-02 MED ORDER — SODIUM CHLORIDE 0.9 % IV SOLN
INTRAVENOUS | Status: DC
  Administered 2011-07-03: 2.2 [IU]/h via INTRAVENOUS
  Filled 2011-07-02: qty 1

## 2011-07-02 MED ORDER — ONDANSETRON HCL 4 MG/2ML IJ SOLN
4.0000 mg | Freq: Once | INTRAMUSCULAR | Status: AC
  Administered 2011-07-03: 4 mg via INTRAVENOUS
  Filled 2011-07-02: qty 2

## 2011-07-02 MED ORDER — MORPHINE SULFATE 4 MG/ML IJ SOLN
4.0000 mg | Freq: Once | INTRAMUSCULAR | Status: AC
  Administered 2011-07-03: 4 mg via INTRAVENOUS
  Filled 2011-07-02: qty 1

## 2011-07-02 NOTE — ED Provider Notes (Signed)
History     CSN: [id number] Arrival date & time: 07/02/2011  6:13 PM   First MD Initiated Contact with Patient 07/02/11 1950      Chief Complaint  Patient presents with  . Hyperglycemia    (Consider location/radiation/quality/duration/timing/severity/associated sxs/prior treatment) Patient is a 25 y.o. female presenting with hypertension and diabetes problem.  Hypertension  Diabetes  pt presents to the ER from Dr. Redmond Baseman office with complaints of uncontrollable elevated blood sugar. The patient is on a insulin pump, has taken two 10 unit boluses and after her blood sugar was still 594. Pt states she feels nauseas and just "not well".  Past Medical History  Diagnosis Date  . Diabetes mellitus     History reviewed. No pertinent past surgical history.  No family history on file.  History  Substance Use Topics  . Smoking status: Not on file  . Smokeless tobacco: Not on file  . Alcohol Use: No    OB History    Grav Para Term Preterm Abortions TAB SAB Ect Mult Living                  Review of Systems  All other systems reviewed and are negative.    Allergies  Review of patient's allergies indicates no known allergies.  Home Medications   Current Outpatient Rx  Name Route Sig Dispense Refill  . ACETAMINOPHEN 325 MG PO TABS Oral Take 650 mg by mouth every 6 (six) hours as needed. Used for pain     . INSULIN ASPART 100 UNIT/ML [place] SOLN Subcutaneous Inject 10 Units into the skin 3 (three) times daily before meals. Use accordingly     . METFORMIN HCL 500 MG PO TABS Oral Take 500 mg by mouth 2 (two) times daily with a meal.        BP 99/63  Pulse 84  Temp(Src) 98.4 F (36.9 C) (Oral)  Resp 16  Wt 165 lb (74.844 kg)  SpO2 99%  LMP 06/30/2011  Physical Exam  Constitutional: She appears well-developed and well-nourished.  HENT:  Head: Normocephalic and atraumatic.  Eyes: Conjunctivae are normal. Pupils are equal, round, and reactive to light.  Neck:  Trachea normal, normal range of motion and full passive range of motion without pain. Neck supple.  Cardiovascular: Normal rate, regular rhythm and normal pulses.   Pulmonary/Chest: Effort normal and breath sounds normal. Chest wall is not dull to percussion. She exhibits no tenderness, no crepitus, no edema, no deformity and no retraction.  Abdominal: Soft. Normal appearance and bowel sounds are normal.  Musculoskeletal: Normal range of motion.  Lymphadenopathy:       Head (right side): No submental, no submandibular, no tonsillar, no preauricular, no posterior auricular and no occipital adenopathy present.       Head (left side): No submental, no submandibular, no tonsillar, no preauricular, no posterior auricular and no occipital adenopathy present.    She has no cervical adenopathy.    She has no axillary adenopathy.  Neurological: She is alert. She has normal strength.  Skin: Skin is warm, dry and intact.  Psychiatric: She has a normal mood and affect. Her speech is normal and behavior is normal. Judgment and thought content normal. Cognition and memory are normal.    ED Course  Procedures (including critical care time)  Labs Reviewed  GLUCOSE, CAPILLARY - Abnormal; Notable for the following:    Glucose-Capillary 571 (*)    All other components within normal limits  BASIC METABOLIC PANEL - Abnormal;  Notable for the following:    Sodium 128 (*)    Chloride 89 (*)    Glucose, Bld 502 (*)    All other components within normal limits  DIFFERENTIAL - Abnormal; Notable for the following:    Lymphocytes Relative 48 (*)    All other components within normal limits  URINALYSIS, ROUTINE W REFLEX MICROSCOPIC - Abnormal; Notable for the following:    Specific Gravity, Urine 1.037 (*)    Glucose, UA >1000 (*)    Ketones, ur 15 (*)    All other components within normal limits  BLOOD GAS, VENOUS - Abnormal; Notable for the following:    pH, Ven 7.384 (*)    pCO2, Ven 43.7 (*)     Bicarbonate 25.5 (*)    All other components within normal limits  URINE MICROSCOPIC-ADD ON - Abnormal; Notable for the following:    Squamous Epithelial / LPF FEW (*)    All other components within normal limits  GLUCOSE, CAPILLARY - Abnormal; Notable for the following:    Glucose-Capillary 486 (*)    All other components within normal limits  CBC  PREGNANCY, URINE  POCT CBG MONITORING  POCT CBG MONITORING  BLOOD GAS, VENOUS  POCT CBG MONITORING   No results found.   No diagnosis found.    MDM  NO EKG, blood sugar is not controlled after two 10 unit bolus and while the patient has been on an insulin pump. Pt has been on IV insulin here in ED and insulin is still not coming down very quickly. Dr. Brooke Dare spoke with [facility]. Pt admitted to [facility], Dr. Janee Morn.        Dorthula Matas, PA 07/02/11 2250

## 2011-07-02 NOTE — ED Notes (Signed)
Pt is resting at this time. Care assumed for patient. Insulin gtt continues to infuse, will monitor CBG and adjust per glucostablizer. Pt denies nausea or pain, will hold off on meds at this time. Until needed. Call bell within reach

## 2011-07-02 NOTE — ED Notes (Signed)
Patient is resting comfortably. 

## 2011-07-02 NOTE — ED Notes (Signed)
States that she was told by her physician to remove the insulin pump and recheck her blood sugar. States that she had 3 high readings on her glucometer and was told by her physician to come to the ER to R/O DKA.

## 2011-07-02 NOTE — ED Notes (Signed)
Vital signs stable. 

## 2011-07-02 NOTE — ED Provider Notes (Signed)
Medical screening examination/treatment/procedure(s) were performed by non-physician practitioner and as supervising physician I was immediately available for consultation/collaboration.   Dayton Bailiff, MD 07/02/11 2351

## 2011-07-03 ENCOUNTER — Encounter (HOSPITAL_COMMUNITY): Payer: Self-pay

## 2011-07-03 DIAGNOSIS — E1040 Type 1 diabetes mellitus with diabetic neuropathy, unspecified: Secondary | ICD-10-CM | POA: Diagnosis present

## 2011-07-03 DIAGNOSIS — E1065 Type 1 diabetes mellitus with hyperglycemia: Secondary | ICD-10-CM | POA: Diagnosis present

## 2011-07-03 LAB — GLUCOSE, CAPILLARY
Glucose-Capillary: 122 mg/dL — ABNORMAL HIGH (ref 70–99)
Glucose-Capillary: 128 mg/dL — ABNORMAL HIGH (ref 70–99)
Glucose-Capillary: 156 mg/dL — ABNORMAL HIGH (ref 70–99)
Glucose-Capillary: 161 mg/dL — ABNORMAL HIGH (ref 70–99)
Glucose-Capillary: 167 mg/dL — ABNORMAL HIGH (ref 70–99)
Glucose-Capillary: 169 mg/dL — ABNORMAL HIGH (ref 70–99)
Glucose-Capillary: 198 mg/dL — ABNORMAL HIGH (ref 70–99)
Glucose-Capillary: 289 mg/dL — ABNORMAL HIGH (ref 70–99)
Glucose-Capillary: 311 mg/dL — ABNORMAL HIGH (ref 70–99)
Glucose-Capillary: 322 mg/dL — ABNORMAL HIGH (ref 70–99)

## 2011-07-03 LAB — COMPREHENSIVE METABOLIC PANEL
ALT: 8 U/L (ref 0–35)
Alkaline Phosphatase: 61 U/L (ref 39–117)
BUN: 9 mg/dL (ref 6–23)
CO2: 26 mEq/L (ref 19–32)
Calcium: 9 mg/dL (ref 8.4–10.5)
GFR calc Af Amer: >90 mL/min (ref >90)
GFR calc non Af Amer: >90 mL/min (ref >90)
Glucose, Bld: 185 mg/dL — ABNORMAL HIGH (ref 70–99)
Potassium: 4 mEq/L (ref 3.5–5.1)
Sodium: 136 mEq/L (ref 135–145)

## 2011-07-03 LAB — MAGNESIUM: Magnesium: 2 mg/dL (ref 1.5–2.5)

## 2011-07-03 LAB — PHOSPHORUS: Phosphorus: 3.9 mg/dL (ref 2.3–4.6)

## 2011-07-03 MED ORDER — FOLIC ACID 1 MG PO TABS
1.0000 mg | ORAL_TABLET | Freq: Every day | ORAL | Status: DC
Start: 2011-07-03 — End: 2011-07-05
  Administered 2011-07-03 – 2011-07-05 (×3): 1 mg via ORAL
  Filled 2011-07-03 (×4): qty 1

## 2011-07-03 MED ORDER — ONDANSETRON HCL 4 MG PO TABS: 4.0000 mg | ORAL_TABLET | Freq: Four times a day (QID) | ORAL | Status: DC | PRN

## 2011-07-03 MED ORDER — HYDROCODONE-ACETAMINOPHEN 5-325 MG PO TABS
1.0000 | ORAL_TABLET | ORAL | Status: DC | PRN
  Administered 2011-07-04 (×2): 2 via ORAL

## 2011-07-03 MED ORDER — ACETAMINOPHEN 325 MG PO TABS: 650.0000 mg | ORAL_TABLET | Freq: Four times a day (QID) | ORAL | Status: DC | PRN

## 2011-07-03 MED ORDER — METFORMIN HCL 500 MG PO TABS
500.0000 mg | ORAL_TABLET | Freq: Two times a day (BID) | ORAL | Status: DC
  Administered 2011-07-03 – 2011-07-05 (×5): 500 mg via ORAL
  Filled 2011-07-03 (×9): qty 1

## 2011-07-03 MED ORDER — SENNA 8.6 MG PO TABS
2.0000 | ORAL_TABLET | Freq: Every day | ORAL | Status: DC | PRN
  Filled 2011-07-03: qty 2

## 2011-07-03 MED ORDER — ONDANSETRON HCL 4 MG/2ML IJ SOLN: 4.0000 mg | Freq: Four times a day (QID) | INTRAMUSCULAR | Status: DC | PRN

## 2011-07-03 MED ORDER — DEXTROSE 50 % IV SOLN: 25.0000 mL | INTRAVENOUS | Status: DC | PRN

## 2011-07-03 MED ORDER — SODIUM CHLORIDE 0.9 % IV SOLN
INTRAVENOUS | Status: DC
  Administered 2011-07-03 – 2011-07-05 (×4): via INTRAVENOUS

## 2011-07-03 MED ORDER — ALUM & MAG HYDROXIDE-SIMETH 200-200-20 MG/5ML PO SUSP: 30.0000 mL | Freq: Four times a day (QID) | ORAL | Status: DC | PRN

## 2011-07-03 MED ORDER — ZOLPIDEM TARTRATE 5 MG PO TABS
5.0000 mg | ORAL_TABLET | Freq: Every evening | ORAL | Status: DC | PRN
  Administered 2011-07-04 (×2): 5 mg via ORAL
  Filled 2011-07-03 (×2): qty 1

## 2011-07-03 MED ORDER — SODIUM CHLORIDE 0.9 % IV SOLN
INTRAVENOUS | Status: DC
  Administered 2011-07-03: 22:00:00 via INTRAVENOUS
  Filled 2011-07-03 (×2): qty 1

## 2011-07-03 MED ORDER — POTASSIUM CHLORIDE IN NACL 20-0.9 MEQ/L-% IV SOLN
INTRAVENOUS | Status: DC
  Administered 2011-07-03 (×2): via INTRAVENOUS
  Filled 2011-07-03 (×7): qty 1000

## 2011-07-03 MED ORDER — POTASSIUM CHLORIDE CRYS ER 20 MEQ PO TBCR
40.0000 meq | EXTENDED_RELEASE_TABLET | Freq: Two times a day (BID) | ORAL | Status: DC
  Administered 2011-07-03 (×2): 40 meq via ORAL
  Filled 2011-07-03: qty 1
  Filled 2011-07-03: qty 2
  Filled 2011-07-03 (×2): qty 1

## 2011-07-03 MED ORDER — ACETAMINOPHEN 650 MG RE SUPP: 650.0000 mg | Freq: Four times a day (QID) | RECTAL | Status: DC | PRN

## 2011-07-03 NOTE — ED Notes (Signed)
Paged M. Lynch on call for [facility] regarding insulin gtt, pt maintaining between 78-81 for last three hours, awaiting orders for basal insulin and to stop gtt. Daphane Shepherd paged again with no response. Awaiting return call for on call attending. Information forwarded to oncoming RN

## 2011-07-03 NOTE — Progress Notes (Signed)
Subjective: Patient denies nausea vomiting, she denies any complaints. She is alert and appropriate. Objective: Vital signs in last 24 hours: Temp:  [98.4 F (36.9 C)-98.7 F (37.1 C)] 98.7 F (37.1 C) (11/14 0349) Pulse Rate:  [72-87] 72  (11/14 1801) Resp:  [14-18] 18  (11/14 0349) BP: (91-109)/(48-63) 91/50 mmHg (11/14 1801) SpO2:  [99 %-100 %] 100 % (11/14 1801)   General Appearance:    Alert, cooperative, no distress, appears stated age  Lungs:     Clear to auscultation bilaterally, respirations unlabored   Heart:    Regular rate and rhythm, S1 and S2 normal, no murmur, rub   or gallop  Abdomen:     Soft, non-tender, bowel sounds active all four quadrants,    no masses, no organomegaly  Extremities:   Extremities normal, atraumatic, no cyanosis or edema  Neurologic:   CNII-XII intact, normal strength, sensation and reflexes    throughout    Weight change:  No intake or output data in the 24 hours ending 07/03/11 2021  Lab Results:   [facility] 07/03/11 1016 07/02/11 1955  NA 136 128*  K 4.0 3.5  CL 102 89*  CO2 26 27  GLUCOSE 185* 502*  BUN 9 13  CREATININE 0.60 0.59  CALCIUM 9.0 10.4    Basename 07/02/11 1955  WBC 8.2  HGB 14.3  HCT 39.8  PLT 376  MCV 84.7   PT/INR No results found for this basename: LABPROT:2,INR:2 in the last 72 hours ABG  Basename 07/02/11 2057  PHART --  HCO3 25.5*    Micro Results: No results found for this or any previous visit (from the past 240 hour(s)). Studies/Results: No results found. Medications: Scheduled Meds:   . folic acid  1 mg Oral Daily  . metFORMIN  500 mg Oral BID WC  .  morphine injection  4 mg Intravenous Once  . ondansetron  4 mg Intravenous Once  . potassium chloride  40 mEq Oral BID   Continuous Infusions:   . sodium chloride    . insulin (NOVOLIN-R) infusion 2.2 Units/hr (07/03/11 1243)  . insulin (NOVOLIN-R) infusion    . DISCONTD: 0.9 % NaCl with KCl 20 mEq / L 125 mL/hr at 07/03/11 1102    PRN Meds:.acetaminophen, acetaminophen, alum & mag hydroxide-simeth, dextrose, HYDROcodone-acetaminophen, ondansetron (ZOFRAN) IV, ondansetron, senna, zolpidem Assessment/Plan: Patient Active Hospital Problem List: 1. hyperosmolar nonketotic hyperglycemia - we'll continue IV insulin R. per glucostabilizer. Follow and transition to subcutaneous insulin/her insulin pump.  2. Hyponatremia-likely pseudo secondary to elevated blood sugars, resolved 3. hypokalemia-resolved.     LOS: 1 day   Orby Tangen C 07/03/2011, 8:21 PM

## 2011-07-03 NOTE — H&P (Addendum)
[name] is an 25 y.o. female.    Chief Complaint: Elevated blood sugar  HPI: 25 year old African American female with a history of diabetes mellitus 2 on an insulin pump since 2009, last hemoglobin A1c over 12 as per patient report, standby her endocrinologist Dr Redmond Baseman for uncontrolled diabetes. The patient received 2 x 10 unit boluses of regular insulin prior to admission and her blood sugar was still 594. The patient states that she has been feeling weak, lightheaded, dizzy, and with a generalized mild to moderate abdominal pain. Denies vomiting. She has had episodes of diarrhea but now she is doing better. Denies fever, chills or night sweats. Patient reports that since she has been diagnosed with diabetes her glucose has not been well controlled not even with an insulin pump. She describes going through these episodes of severe hypoglycemia associated to vague abdominal pain, lightheadedness and dizziness requiring admission to the hospital. The patient denies noncompliance with diet. Admits to not been exercising on a regular basis. States she has been feeling down and frustrated because she cannot control her disease. She has tried to talk to her PCP about feeling down and depressed but she always gets to be seen by a nurse practitioner and they never have time to discuss emotional and mental issues. She used to work for the city of [place] but because of her disease she had to quit her job.  Past Medical History  Diagnosis Date  . Diabetes mellitus     History reviewed. No pertinent past surgical history.  No family history on file. Social History:  does not have a smoking history on file. She does not have any smokeless tobacco history on file. She reports that she does not drink alcohol or use illicit drugs. she is married and has 2 children.  Allergies: No Known Allergies  Medications Prior to Admission  Medication Dose Route Frequency Provider Last Rate Last Dose  .  insulin regular (HUMULIN R,NOVOLIN R) 1 Units/mL in sodium chloride 0.9 % 100 mL infusion   Intravenous Continuous Dorthula Matas, PA 3.9 mL/hr at 07/02/11 2130 3.9 Units/hr at 07/02/11 2130  . morphine 4 MG/ML injection 4 mg  4 mg Intravenous Once Leily G Greene, PA      . ondansetron [facility]) injection 4 mg  4 mg Intravenous Once Dorthula Matas, PA       No current outpatient prescriptions on file as of 07/02/2011.    Results for orders placed during the hospital encounter of 07/02/11 (from the past 48 hour(s))  GLUCOSE, CAPILLARY     Status: Abnormal   Collection Time   07/02/11  6:54 PM      Component Value Range Comment   Glucose-Capillary 571 (*) 70 - 99 (mg/dL)    Comment 1 Notify RN     BASIC METABOLIC PANEL     Status: Abnormal   Collection Time   07/02/11  7:55 PM      Component Value Range Comment   Sodium 128 (*) 135 - 145 (mEq/L)    Potassium 3.5  3.5 - 5.1 (mEq/L)    Chloride 89 (*) 96 - 112 (mEq/L)    CO2 27  19 - 32 (mEq/L)    Glucose, Bld 502 (*) 70 - 99 (mg/dL)    BUN 13  6 - 23 (mg/dL)    Creatinine, Ser 2.95  0.50 - 1.10 (mg/dL)    Calcium 28.4  8.4 - 10.5 (mg/dL)    GFR calc non Af  Amer >90  >90 (mL/min)    GFR calc Af Amer >90  >90 (mL/min)   CBC     Status: Normal   Collection Time   07/02/11  7:55 PM      Component Value Range Comment   WBC 8.2  4.0 - 10.5 (K/uL)    RBC 4.70  3.87 - 5.11 (MIL/uL)    Hemoglobin 14.3  12.0 - 15.0 (g/dL)    HCT 16.1  09.6 - 04.5 (%)    MCV 84.7  78.0 - 100.0 (fL)    MCH 30.4  26.0 - 34.0 (pg)    MCHC 35.9  30.0 - 36.0 (g/dL)    RDW 40.9  81.1 - 91.4 (%)    Platelets 376  150 - 400 (K/uL)   DIFFERENTIAL     Status: Abnormal   Collection Time   07/02/11  7:55 PM      Component Value Range Comment   Neutrophils Relative 44  43 - 77 (%)    Neutro Abs 3.6  1.7 - 7.7 (K/uL)    Lymphocytes Relative 48 (*) 12 - 46 (%)    Lymphs Abs 3.9  0.7 - 4.0 (K/uL)    Monocytes Relative 6  3 - 12 (%)    Monocytes Absolute 0.5   0.1 - 1.0 (K/uL)    Eosinophils Relative 2  0 - 5 (%)    Eosinophils Absolute 0.2  0.0 - 0.7 (K/uL)    Basophils Relative 1  0 - 1 (%)    Basophils Absolute 0.1  0.0 - 0.1 (K/uL)   URINALYSIS, ROUTINE W REFLEX MICROSCOPIC     Status: Abnormal   Collection Time   07/02/11  7:55 PM      Component Value Range Comment   Color, Urine YELLOW  YELLOW     Appearance CLEAR  CLEAR     Specific Gravity, Urine 1.037 (*) 1.005 - 1.030     pH 5.0  5.0 - 8.0     Glucose, UA >1000 (*) NEGATIVE (mg/dL)    Hgb urine dipstick NEGATIVE  NEGATIVE     Bilirubin Urine NEGATIVE  NEGATIVE     Ketones, ur 15 (*) NEGATIVE (mg/dL)    Protein, ur NEGATIVE  NEGATIVE (mg/dL)    Urobilinogen, UA 0.2  0.0 - 1.0 (mg/dL)    Nitrite NEGATIVE  NEGATIVE     Leukocytes, UA NEGATIVE  NEGATIVE    PREGNANCY, URINE     Status: Normal   Collection Time   07/02/11  7:55 PM      Component Value Range Comment   Preg Test, Ur NEGATIVE     URINE MICROSCOPIC-ADD ON     Status: Abnormal   Collection Time   07/02/11  7:55 PM      Component Value Range Comment   Squamous Epithelial / LPF FEW (*) RARE     WBC, UA 0-2  <3 (WBC/hpf)    RBC / HPF 0-2  <3 (RBC/hpf)    Bacteria, UA RARE  RARE    BLOOD GAS, VENOUS     Status: Abnormal   Collection Time   07/02/11  8:57 PM      Component Value Range Comment   FIO2 0.21      pH, Ven 7.384 (*) 7.250 - 7.300     pCO2, Ven 43.7 (*) 45.0 - 50.0 (mmHg)    pO2, Ven 42.5  30.0 - 45.0 (mmHg)    Bicarbonate 25.5 (*) 20.0 - 24.0 (mEq/L)  TCO2 22.6  0 - 100 (mmol/L)    Acid-Base Excess 0.7  0.0 - 2.0 (mmol/L)    O2 Saturation 73.6      Patient temperature 98.6      Collection site VEIN      Drawn by COLLECTED BY NURSE      Sample type VEIN     GLUCOSE, CAPILLARY     Status: Abnormal   Collection Time   07/02/11  8:59 PM      Component Value Range Comment   Glucose-Capillary 486 (*) 70 - 99 (mg/dL)   GLUCOSE, CAPILLARY     Status: Abnormal   Collection Time   07/02/11 10:27 PM       Component Value Range Comment   Glucose-Capillary 292 (*) 70 - 99 (mg/dL)   GLUCOSE, CAPILLARY     Status: Abnormal   Collection Time   07/02/11 11:24 PM      Component Value Range Comment   Glucose-Capillary 256 (*) 70 - 99 (mg/dL)    Comment 1 Notify RN     GLUCOSE, CAPILLARY     Status: Abnormal   Collection Time   07/03/11 12:28 AM      Component Value Range Comment   Glucose-Capillary 289 (*) 70 - 99 (mg/dL)    Comment 1 Notify RN       Review of Systems  Constitutional: Positive for malaise/fatigue. Negative for fever, chills, weight loss and diaphoresis.  HENT: Negative for ear pain, congestion and sore throat.   Eyes: Positive for blurred vision. Negative for double vision, pain and redness.  Respiratory: Negative for cough and sputum production.   Cardiovascular: Negative for chest pain, palpitations, orthopnea and leg swelling.  Gastrointestinal: Positive for nausea, vomiting, abdominal pain, diarrhea and constipation. Negative for heartburn, blood in stool and melena.  Genitourinary: Negative for dysuria, urgency, frequency and flank pain.  Musculoskeletal: Negative for joint pain.  Skin: Negative for rash.  Neurological: Positive for dizziness and weakness. Negative for tingling, sensory change, speech change, focal weakness and headaches.  Endo/Heme/Allergies: Positive for polydipsia.  Psychiatric/Behavioral: Positive for depression (patient will like to speak to a therapist during this admission). Negative for suicidal ideas, hallucinations and substance abuse. The patient is nervous/anxious and has insomnia.     Blood pressure 99/63, pulse 84, temperature 98.4 F (36.9 C), temperature source Oral, resp. rate 16, weight 74.844 kg (165 lb), last menstrual period 06/30/2011, SpO2 99.00%.  Physical Exam  Constitutional: She is oriented to person, place, and time. She appears well-developed and well-nourished.  HENT:  Head: Normocephalic and atraumatic.    Mouth/Throat: Oropharynx is clear and moist.  Eyes: Conjunctivae and EOM are normal. Pupils are equal, round, and reactive to light.  Neck: Normal range of motion. Neck supple. No thyromegaly present.  Cardiovascular: Normal rate, regular rhythm, normal heart sounds and intact distal pulses.   No murmur heard. Respiratory: Effort normal and breath sounds normal.  GI: Soft. Bowel sounds are normal. She exhibits no mass. There is no tenderness. There is no rebound.  Musculoskeletal: Normal range of motion.  Lymphadenopathy:    She has no cervical adenopathy.  Neurological: She is alert and oriented to person, place, and time. She has normal reflexes. She displays normal reflexes. No cranial nerve deficit. She exhibits normal muscle tone. Coordination normal.  Skin: Skin is warm and dry.  Psychiatric:       Looks slightly depressed     Assessment/Plan Diabetes mellitus type 2 with hyperglycemia Depression  Continue  insulin by infusion at 3.9 units per hour regular insulin. Continue metformin 500 mg by mouth twice a day. Switch to subcutaneous insulin sliding scale when appropriate Monitor electrolytes Replace potassium Continue aggressive IV hydration with normal saline Low carbohydrate diet Diabetic education Mental health evaluation   Jonny Ruiz 07/03/2011, 12:52 AM

## 2011-07-03 NOTE — ED Notes (Signed)
Report called to 5th floor nurse

## 2011-07-04 LAB — GLUCOSE, CAPILLARY
Glucose-Capillary: 112 mg/dL — ABNORMAL HIGH (ref 70–99)
Glucose-Capillary: 124 mg/dL — ABNORMAL HIGH (ref 70–99)
Glucose-Capillary: 136 mg/dL — ABNORMAL HIGH (ref 70–99)
Glucose-Capillary: 138 mg/dL — ABNORMAL HIGH (ref 70–99)
Glucose-Capillary: 147 mg/dL — ABNORMAL HIGH (ref 70–99)
Glucose-Capillary: 152 mg/dL — ABNORMAL HIGH (ref 70–99)
Glucose-Capillary: 165 mg/dL — ABNORMAL HIGH (ref 70–99)
Glucose-Capillary: 179 mg/dL — ABNORMAL HIGH (ref 70–99)
Glucose-Capillary: 189 mg/dL — ABNORMAL HIGH (ref 70–99)
Glucose-Capillary: 205 mg/dL — ABNORMAL HIGH (ref 70–99)
Glucose-Capillary: 215 mg/dL — ABNORMAL HIGH (ref 70–99)
Glucose-Capillary: 63 mg/dL — ABNORMAL LOW (ref 70–99)

## 2011-07-04 LAB — BASIC METABOLIC PANEL
Calcium: 9 mg/dL (ref 8.4–10.5)
GFR calc Af Amer: >90 mL/min (ref >90)
GFR calc non Af Amer: >90 mL/min (ref >90)
Glucose, Bld: 212 mg/dL — ABNORMAL HIGH (ref 70–99)
Sodium: 135 mEq/L (ref 135–145)

## 2011-07-04 MED ORDER — DEXTROSE 50 % IV SOLN
INTRAVENOUS | Status: AC
  Administered 2011-07-04: 15 mL
  Filled 2011-07-04: qty 50

## 2011-07-04 MED ORDER — SODIUM CHLORIDE 0.9 % IV BOLUS (SEPSIS)
500.0000 mL | Freq: Once | INTRAVENOUS | Status: AC
  Administered 2011-07-04: 500 mL via INTRAVENOUS

## 2011-07-04 MED ORDER — FLUCONAZOLE 150 MG PO TABS
150.0000 mg | ORAL_TABLET | Freq: Once | ORAL | Status: AC
  Administered 2011-07-04: 150 mg via ORAL
  Filled 2011-07-04: qty 1

## 2011-07-04 MED ORDER — HYDROCODONE-ACETAMINOPHEN 5-325 MG PO TABS
ORAL_TABLET | ORAL | Status: AC
  Administered 2011-07-04: 2 via ORAL
  Filled 2011-07-04: qty 2

## 2011-07-04 MED ORDER — HYDROCODONE-ACETAMINOPHEN 5-325 MG PO TABS
ORAL_TABLET | ORAL | Status: AC
  Filled 2011-07-04: qty 2

## 2011-07-04 MED ORDER — INSULIN PUMP
SUBCUTANEOUS | Status: DC
  Administered 2011-07-04: 6.5 via SUBCUTANEOUS
  Administered 2011-07-05 (×2): 0.1 via SUBCUTANEOUS
  Administered 2011-07-05: 8.65 via SUBCUTANEOUS
  Filled 2011-07-04: qty 1

## 2011-07-04 NOTE — Progress Notes (Signed)
Orders received to d/c glucostabilizer and have pt to place home insulin pump back on at basal rate. Site was checked and infusion rate was verified by this nurse. Home insulin pump contract  signed and  placed to chart.  Rate set at 3.05 units/hr at this time.  CBGs to be checked every four hours and pt started on carb mod diet. Insulin gtt to be continued for another hour per orders. Will continue to monitor.

## 2011-07-04 NOTE — Progress Notes (Signed)
Subjective: Patient doing well today, denies chest pain dizziness and no shortness of breath. Objective: Vital signs in last 24 hours: Temp:  [97.7 F (36.5 C)-98.6 F (37 C)] 97.7 F (36.5 C) (11/15 0610) Pulse Rate:  [68-84] 84  (11/15 0610) Resp:  [18] 18  (11/15 0610) BP: (89-101)/(50-68) 91/60 mmHg (11/15 0610) SpO2:  [97 %-100 %] 97 % (11/15 0610) Weight:  [78.2 kg (172 lb 6.4 oz)] 172 lb 6.4 oz (78.2 kg) (11/14 1935) Last BM Date: 07/02/11 Intake/Output from previous day: 11/14 0701 - 11/15 0700 In: 1618.5 [P.O.:480; I.V.:1138.5] Out: -  Intake/Output this shift:      General Appearance:    Alert, cooperative, no distress  Lungs:     Clear to auscultation bilaterally, respirations unlabored   Heart:    Regular rate and rhythm, S1 and S2 normal, no murmur, rub   or gallop  Abdomen:     Soft, non-tender, bowel sounds active all four quadrants,    no masses, no organomegaly  Extremities:   Extremities normal, atraumatic, no cyanosis or edema  Neurologic:   CNII-XII intact, normal strength, sensation and reflexes    throughout    Weight change: 3.357 kg (7 lb 6.4 oz)  Intake/Output Summary (Last 24 hours) at 07/04/11 1724 Last data filed at 07/04/11 0509  Gross per 24 hour  Intake 1618.47 ml  Output      0 ml  Net 1618.47 ml    Lab Results:   Basename 07/04/11 0520 07/03/11 1016  NA 135 136  K 3.9 4.0  CL 103 102  CO2 25 26  GLUCOSE 212* 185*  BUN 7 9  CREATININE 0.58 0.60  CALCIUM 9.0 9.0    Basename 07/02/11 1955  WBC 8.2  HGB 14.3  HCT 39.8  PLT 376  MCV 84.7   PT/INR No results found for this basename: LABPROT:2,INR:2 in the last 72 hours ABG  Basename 07/02/11 2057  PHART --  HCO3 25.5*    Micro Results: No results found for this or any previous visit (from the past 240 hour(s)). Studies/Results: No results found. Medications: Scheduled Meds:   . dextrose      . fluconazole  150 mg Oral Once  . folic acid  1 mg Oral Daily  .  insulin pump   Subcutaneous Q4H  . metFORMIN  500 mg Oral BID WC  . DISCONTD: potassium chloride  40 mEq Oral BID   Continuous Infusions:   . sodium chloride 150 mL/hr at 07/04/11 1033  . insulin (NOVOLIN-R) infusion 3.7 Units/hr (07/04/11 0600)  . DISCONTD: 0.9 % NaCl with KCl 20 mEq / L 125 mL/hr at 07/03/11 1102  . DISCONTD: insulin (NOVOLIN-R) infusion 2.2 Units/hr (07/03/11 1243)   PRN Meds:.acetaminophen, acetaminophen, alum & mag hydroxide-simeth, dextrose, HYDROcodone-acetaminophen, ondansetron (ZOFRAN) IV, ondansetron, senna, zolpidem Assessment/Plan: 1. hyperosmolar nonketotic hyperglycemia - her blood glucose control is improved , will transition transition to subcutaneous  by her insulin pump- continue monitoring  blood sugars and further treat accordingly.  2. Hypotension- likely secondary to  Hypovolemia will bolus with normal saline and continue current IV fluids.    LOS: 2 days   Rakel Junio C 07/04/2011, 5:24 PM

## 2011-07-04 NOTE — Progress Notes (Signed)
Glucostabilizer d/ced at this time per orders. Home insulin pump in place.

## 2011-07-04 NOTE — Plan of Care (Signed)
Problem: Consults Goal: Diagnosis-Diabetes Mellitus Outcome: Completed/Met Date Met:  07/04/11 Hyperglycemia

## 2011-07-04 NOTE — Progress Notes (Signed)
Pts glucose checked at this time. Was 63.  Insulin infusion held and 15ml of D50 given per glucostabilizer orders.  Will recheck blood sugar as ordered.

## 2011-07-04 NOTE — Progress Notes (Signed)
Inpatient Diabetes Program Recommendations  AACE/ADA: New Consensus Statement on Inpatient Glycemic Control (2009)  Target Ranges:  Prepandial:   less than 140 mg/dL      Peak postprandial:   less than 180 mg/dL (1-2 hours)      Critically ill patients:  140 - 180 mg/dL   Reason for Visit: Hyperglycemia  Inpatient Diabetes Program Recommendations Insulin - IV drip/GlucoStabilizer: Continue glucostabilizer until BS within target range x 4 hours.  Restart insulin pump 1 hour prior to d/cing glucostabilizer.   HgbA1C: 15.4%.  Very poor glucose control.  Was 12.6 on 08/26/2010.   Diet: FL - when diet advanced, CHO mod med.  Note: Pt reports blood sugars have been elevated for past year even though she has an insulin pump.  Has seen Dr. Lurene Shadow and adjustments were made with basal / bolus rates, but blood sugars have continued to be high.  Pt. Very frustrated and states she follows diet most of the time, changes injection sites every 48 hours and checks blood sugars multiple times each day.  Pt. C/o abdominal pain and states she "thinks something else might be going on."  Pt. Knowledgeable of use of insulin pump and will need continuous titration until CBGs WNL.  Would recommend consult with Dr. Lurene Shadow, endocrinologist, to further assist with management of blood sugars.  Discussed with RN.

## 2011-07-04 NOTE — Progress Notes (Signed)
Pt CBG prior to dinner was 174. Pt consumed 38 grams of carbs at dinner and administered herself 18 unit bolus per insulin pump at this time.  Will recheck CBG at scheduled times.

## 2011-07-05 LAB — GLUCOSE, CAPILLARY
Glucose-Capillary: 118 mg/dL — ABNORMAL HIGH (ref 70–99)
Glucose-Capillary: 71 mg/dL (ref 70–99)
Glucose-Capillary: 86 mg/dL (ref 70–99)
Glucose-Capillary: 90 mg/dL (ref 70–99)

## 2011-07-05 LAB — BASIC METABOLIC PANEL
BUN: 5 mg/dL — ABNORMAL LOW (ref 6–23)
CO2: 24 mEq/L (ref 19–32)
GFR calc non Af Amer: >90 mL/min (ref >90)
Glucose, Bld: 105 mg/dL — ABNORMAL HIGH (ref 70–99)
Potassium: 3.5 mEq/L (ref 3.5–5.1)

## 2011-07-05 NOTE — Progress Notes (Signed)
Diabetic Educator [name] came by to see the pt and recomended to pt that she needed to follow-up with her Endocrinologist. Pt is aware of her diabetic regime and knows how to properly count carbs, check her BS and also administer insulin as her insulin pump recommends. No additional diabetic education material was required or given to the pt at this time. Sayed Apostol [name]

## 2011-07-05 NOTE — Discharge Summary (Signed)
Name: [name] MRN: [id number] DOB: 06-05-1986 25 y.o.  Date of Admission: 07/02/2011  6:13 PM Date of Discharge: 07/05/2011 Attending Physician: Kela Millin  Discharge Diagnosis: Principal Problem: #1 *Hyperosmolar nonketotic hyperglycemia /Type 2 diabetes mellitus with hyperglycemia #2 hyponatremia-resolved #3 hypokalemia-resolved #4 volume depletion-resolved    Discharge Medications: Current Discharge Medication List    CONTINUE these medications which have NOT CHANGED   Details  insulin aspart (NOVOLOG) 100 UNIT/ML injection Inject 10 Units into the skin 3 (three) times daily before meals. Use accordingly     metFORMIN (GLUCOPHAGE) 500 MG tablet Take 500 mg by mouth 2 (two) times daily with a meal.        STOP taking these medications     acetaminophen (TYLENOL) 325 MG tablet         Disposition and follow-up:   Ms.[name] was discharged from [facility] in good condition Follow-up Appointments: Discharge Orders    Future Orders Please Complete By Expires   Diet Carb Modified      Increase activity slowly      Discharge instructions        Brief history: The patient is a pleasant 25 year old [place] American female with a history of diabetes mellitus 2 on an insulin pump since 2009, followed by Dr. Balan/endocrinologist who presented with elevated blood sugars  receiving 2 x 10 unit boluses of regular insulin prior to admission and her blood sugar was still 594. The patient reported that she had been feeling weak, lightheaded, dizzy, and with a generalized mild to moderate abdominal pain. Denied vomiting. She had had episodes of diarrhea but now she is doing better. Denies fever, chills or night sweats. Patient reports that since she has been diagnosed with diabetes her glucose has not been well controlled not even with an insulin pump. She describes going through these episodes of severe hypoglycemia associated  to vague abdominal pain, lightheadedness and dizziness requiring admission to the hospital. The patient denies noncompliance with diet. She was seen in the ED and admitted for further evaluation and management Hospital Course by problem list:  #1 *Hyperosmolar nonketotic hyperglycemia /Type 2 diabetes mellitus with hyperglycemia- as discussed above, upon admission she was started on IV insulin via  Glucostabilizer. Her blood sugars were monitored as per protocol until her blood sugars were better controlled and she met criteria she was then  transitioned from IV insulin to her insulin pump after overlapping for an hour. The patient's blood sugars today have been ranging from 86-161, she is tolerating by mouth well. She has had no further abdominal pain and no nausea or vomiting. The impression was that this was likely precipitated by volume depletion. Other workup for precipitating factors was negative. She is to call her endocrinologist on Monday for followup appointment as directed. #2 hyponatremia-Resolved. The impression was that this was likely secondary to volume depletion as well as a component of pseudohyponatremia related to her elevated blood sugar. #3 hypokalemia-her potassium was repleted during this hospital stay. #4 volume depletion-she was hydrated with IV fluids, and this resolved.   Discharge Vitals:  BP 95/63  Pulse 66  Temp(Src) 97.9 F (36.6 C) (Oral)  Resp 16  Ht 5\' 7"  (1.702 m)  Wt 78.2 kg (172 lb 6.4 oz)  BMI 27.00 kg/m2  SpO2 100%  LMP 06/30/2011  Discharge Labs:  Results for orders placed during the hospital encounter of 07/02/11 (from the past 24 hour(s))  GLUCOSE, CAPILLARY  Status: Abnormal   Collection Time   07/04/11  4:17 PM      Component Value Range   Glucose-Capillary 63 (*) 70 - 99 (mg/dL)   Comment 1 Notify RN    GLUCOSE, CAPILLARY     Status: Abnormal   Collection Time   07/04/11  4:43 PM      Component Value Range   Glucose-Capillary 129 (*) 70  - 99 (mg/dL)   Comment 1 Notify RN    GLUCOSE, CAPILLARY     Status: Abnormal   Collection Time   07/04/11  5:53 PM      Component Value Range   Glucose-Capillary 174 (*) 70 - 99 (mg/dL)   Comment 1 Notify RN    GLUCOSE, CAPILLARY     Status: Abnormal   Collection Time   07/04/11  8:07 PM      Component Value Range   Glucose-Capillary 138 (*) 70 - 99 (mg/dL)   Comment 1 Notify RN    GLUCOSE, CAPILLARY     Status: Normal   Collection Time   07/05/11 12:30 AM      Component Value Range   Glucose-Capillary 71  70 - 99 (mg/dL)   Comment 1 Notify RN    GLUCOSE, CAPILLARY     Status: Normal   Collection Time   07/05/11  1:23 AM      Component Value Range   Glucose-Capillary 90  70 - 99 (mg/dL)  GLUCOSE, CAPILLARY     Status: Abnormal   Collection Time   07/05/11  4:08 AM      Component Value Range   Glucose-Capillary 111 (*) 70 - 99 (mg/dL)   Comment 1 Notify RN    BASIC METABOLIC PANEL     Status: Abnormal   Collection Time   07/05/11  5:13 AM      Component Value Range   Sodium 138  135 - 145 (mEq/L)   Potassium 3.5  3.5 - 5.1 (mEq/L)   Chloride 106  96 - 112 (mEq/L)   CO2 24  19 - 32 (mEq/L)   Glucose, Bld 105 (*) 70 - 99 (mg/dL)   BUN 5 (*) 6 - 23 (mg/dL)   Creatinine, Ser 7.82  0.50 - 1.10 (mg/dL)   Calcium 9.0  8.4 - 95.6 (mg/dL)   GFR calc non Af Amer >90  >90 (mL/min)   GFR calc Af Amer >90  >90 (mL/min)  GLUCOSE, CAPILLARY     Status: Normal   Collection Time   07/05/11  7:54 AM      Component Value Range   Glucose-Capillary 86  70 - 99 (mg/dL)   Comment 1 Notify RN    GLUCOSE, CAPILLARY     Status: Abnormal   Collection Time   07/05/11  9:43 AM      Component Value Range   Glucose-Capillary 161 (*) 70 - 99 (mg/dL)   Comment 1 Notify RN    GLUCOSE, CAPILLARY     Status: Abnormal   Collection Time   07/05/11  1:20 PM      Component Value Range   Glucose-Capillary 118 (*) 70 - 99 (mg/dL)   Comment 1 Notify RN      SignedKela Millin 07/05/2011, 3:38 PM

## 2011-08-26 ENCOUNTER — Emergency Department (HOSPITAL_COMMUNITY)
Admission: EM | Admit: 2011-08-26 | Discharge: 2011-08-27 | Disposition: A | Discharge status: Home / Self Care | Payer: BC Managed Care – PPO | Attending: Emergency Medicine | Admitting: Emergency Medicine

## 2011-08-26 ENCOUNTER — Encounter (HOSPITAL_COMMUNITY): Payer: Self-pay

## 2011-08-26 DIAGNOSIS — R112 Nausea with vomiting, unspecified: Secondary | ICD-10-CM | POA: Insufficient documentation

## 2011-08-26 DIAGNOSIS — R109 Unspecified abdominal pain: Secondary | ICD-10-CM | POA: Insufficient documentation

## 2011-08-26 DIAGNOSIS — E119 Type 2 diabetes mellitus without complications: Secondary | ICD-10-CM | POA: Insufficient documentation

## 2011-08-26 DIAGNOSIS — Z79899 Other long term (current) drug therapy: Secondary | ICD-10-CM | POA: Insufficient documentation

## 2011-08-26 DIAGNOSIS — R42 Dizziness and giddiness: Secondary | ICD-10-CM | POA: Insufficient documentation

## 2011-08-26 DIAGNOSIS — R5383 Other fatigue: Secondary | ICD-10-CM | POA: Insufficient documentation

## 2011-08-26 DIAGNOSIS — R5381 Other malaise: Secondary | ICD-10-CM | POA: Insufficient documentation

## 2011-08-26 DIAGNOSIS — R739 Hyperglycemia, unspecified: Secondary | ICD-10-CM

## 2011-08-26 LAB — URINALYSIS, ROUTINE W REFLEX MICROSCOPIC
Bilirubin Urine: NEGATIVE
Ketones, ur: NEGATIVE mg/dL
Leukocytes, UA: NEGATIVE
Nitrite: NEGATIVE
Protein, ur: NEGATIVE mg/dL
Urobilinogen, UA: 0.2 mg/dL (ref 0.0–1.0)
pH: 6 (ref 5.0–8.0)

## 2011-08-26 LAB — CBC
HCT: 42.3 % (ref 36.0–46.0)
MCH: 30 pg (ref 26.0–34.0)
MCHC: 35.7 g/dL (ref 30.0–36.0)
RDW: 12.2 % (ref 11.5–15.5)

## 2011-08-26 LAB — GLUCOSE, CAPILLARY: Glucose-Capillary: 211 mg/dL — ABNORMAL HIGH (ref 70–99)

## 2011-08-26 LAB — BASIC METABOLIC PANEL
Chloride: 89 mEq/L — ABNORMAL LOW (ref 96–112)
Creatinine, Ser: 0.61 mg/dL (ref 0.50–1.10)
GFR calc Af Amer: >90 mL/min (ref >90)
GFR calc non Af Amer: >90 mL/min (ref >90)

## 2011-08-26 LAB — DIFFERENTIAL
Basophils Absolute: 0 10*3/uL (ref 0.0–0.1)
Basophils Relative: 1 % (ref 0–1)
Eosinophils Absolute: 0.2 10*3/uL (ref 0.0–0.7)
Monocytes Absolute: 0.4 10*3/uL (ref 0.1–1.0)
Neutro Abs: 4.3 10*3/uL (ref 1.7–7.7)
Neutrophils Relative %: 52 % (ref 43–77)

## 2011-08-26 MED ORDER — SODIUM CHLORIDE 0.9 % IV BOLUS (SEPSIS)
1000.0000 mL | Freq: Once | INTRAVENOUS | Status: AC
  Administered 2011-08-26: 1000 mL via INTRAVENOUS

## 2011-08-26 MED ORDER — INSULIN ASPART PROT & ASPART (70-30 MIX) 100 UNIT/ML ~~LOC~~ SUSP
16.0000 [IU] | Freq: Once | SUBCUTANEOUS | Status: AC
  Administered 2011-08-26: 16 [IU] via SUBCUTANEOUS
  Filled 2011-08-26: qty 3

## 2011-08-26 NOTE — ED Provider Notes (Signed)
History     CSN: [id number]  Arrival date & time 08/26/11  1741   First MD Initiated Contact with Patient 08/26/11 1849      Chief Complaint  Patient presents with  . Hyperglycemia    (Consider location/radiation/quality/duration/timing/severity/associated sxs/prior treatment) Patient is a 26 y.o. female presenting with weakness. The history is provided by the patient.  Weakness The primary symptoms include dizziness, nausea and vomiting. Primary symptoms do not include fever. The symptoms began 5 to 7 days ago. The symptoms are worsening. The neurological symptoms are diffuse.  Dizziness also occurs with nausea, vomiting and weakness.   Additional symptoms include weakness.  Pt states she is type 1 diabetic. States unable to afford her insulin. States she ran out 1 week ago and has not had any since. States nausea, abdominal pain, vomiting, weakness, dizziness.   Past Medical History  Diagnosis Date  . Diabetes mellitus     Past Surgical History  Procedure Date  . Cesarean section     History reviewed. No pertinent family history.  History  Substance Use Topics  . Smoking status: Never Smoker   . Smokeless tobacco: Never Used  . Alcohol Use: No    OB History    Grav Para Term Preterm Abortions TAB SAB Ect Mult Living                  Review of Systems  Constitutional: Negative for fever.  HENT: Negative.   Eyes: Negative.   Respiratory: Negative.   Cardiovascular: Negative.   Gastrointestinal: Positive for nausea, vomiting and abdominal pain.  Genitourinary: Negative.   Musculoskeletal: Negative.   Skin: Negative.   Neurological: Positive for dizziness and weakness.  Psychiatric/Behavioral: Negative.     Allergies  Review of patient's allergies indicates no known allergies.  Home Medications   Current Outpatient Rx  Name Route Sig Dispense Refill  . INSULIN ASPART 100 UNIT/ML [place] SOLN Subcutaneous Inject 10 Units into the skin 3 (three) times daily  before meals. Use accordingly     . METFORMIN HCL 500 MG PO TABS Oral Take 500 mg by mouth 2 (two) times daily with a meal.        BP 98/68  Pulse 83  Temp(Src) 98.6 F (37 C) (Oral)  Resp 16  SpO2 99%  Physical Exam  Nursing note and vitals reviewed. Constitutional: She is oriented to person, place, and time. She appears well-developed and well-nourished. No distress.  HENT:  Head: Normocephalic and atraumatic.  Eyes: Conjunctivae are normal.  Neck: Neck supple.  Cardiovascular: Normal rate, regular rhythm and normal heart sounds.   Pulmonary/Chest: Effort normal and breath sounds normal. No respiratory distress.  Abdominal: Soft. Bowel sounds are normal. There is no tenderness.  Musculoskeletal: Normal range of motion. She exhibits no edema.  Neurological: She is alert and oriented to person, place, and time.  Skin: Skin is warm and dry. No erythema.    ED Course  Procedures (including critical care time) Pt's glu greater then 600. Will get fluids started. Will get labs, will monitor.   Results for orders placed during the hospital encounter of 08/26/11  GLUCOSE, CAPILLARY      Component Value Range   Glucose-Capillary >600 (*) 70 - 99 (mg/dL)   Comment 1 Documented in Chart     Comment 2 Notify RN    CBC      Component Value Range   WBC 8.3  4.0 - 10.5 (K/uL)   RBC 5.03  3.87 -  5.11 (MIL/uL)   Hemoglobin 15.1 (*) 12.0 - 15.0 (g/dL)   HCT 04.5  40.9 - 81.1 (%)   MCV 84.1  78.0 - 100.0 (fL)   MCH 30.0  26.0 - 34.0 (pg)   MCHC 35.7  30.0 - 36.0 (g/dL)   RDW 91.4  78.2 - 95.6 (%)   Platelets 373  150 - 400 (K/uL)  DIFFERENTIAL      Component Value Range   Neutrophils Relative 52  43 - 77 (%)   Neutro Abs 4.3  1.7 - 7.7 (K/uL)   Lymphocytes Relative 41  12 - 46 (%)   Lymphs Abs 3.4  0.7 - 4.0 (K/uL)   Monocytes Relative 5  3 - 12 (%)   Monocytes Absolute 0.4  0.1 - 1.0 (K/uL)   Eosinophils Relative 2  0 - 5 (%)   Eosinophils Absolute 0.2  0.0 - 0.7 (K/uL)    Basophils Relative 1  0 - 1 (%)   Basophils Absolute 0.0  0.0 - 0.1 (K/uL)  BASIC METABOLIC PANEL      Component Value Range   Sodium 128 (*) 135 - 145 (mEq/L)   Potassium 3.8  3.5 - 5.1 (mEq/L)   Chloride 89 (*) 96 - 112 (mEq/L)   CO2 28  19 - 32 (mEq/L)   Glucose, Bld 524 (*) 70 - 99 (mg/dL)   BUN 10  6 - 23 (mg/dL)   Creatinine, Ser 2.13  0.50 - 1.10 (mg/dL)   Calcium 08.6  8.4 - 10.5 (mg/dL)   GFR calc non Af Amer >90  >90 (mL/min)   GFR calc Af Amer >90  >90 (mL/min)  URINALYSIS, ROUTINE W REFLEX MICROSCOPIC      Component Value Range   Color, Urine YELLOW  YELLOW    APPearance CLEAR  CLEAR    Specific Gravity, Urine 1.041 (*) 1.005 - 1.030    pH 6.0  5.0 - 8.0    Glucose, UA >1000 (*) NEGATIVE (mg/dL)   Hgb urine dipstick NEGATIVE  NEGATIVE    Bilirubin Urine NEGATIVE  NEGATIVE    Ketones, ur NEGATIVE  NEGATIVE (mg/dL)   Protein, ur NEGATIVE  NEGATIVE (mg/dL)   Urobilinogen, UA 0.2  0.0 - 1.0 (mg/dL)   Nitrite NEGATIVE  NEGATIVE    Leukocytes, UA NEGATIVE  NEGATIVE   POCT PREGNANCY, URINE      Component Value Range   Preg Test, Ur NEGATIVE    URINE MICROSCOPIC-ADD ON      Component Value Range   Squamous Epithelial / LPF FEW (*) RARE    Bacteria, UA FEW (*) RARE   GLUCOSE, CAPILLARY      Component Value Range   Glucose-Capillary 355 (*) 70 - 99 (mg/dL)   Comment 1 Documented in Chart    GLUCOSE, CAPILLARY      Component Value Range   Glucose-Capillary 211 (*) 70 - 99 (mg/dL)   Comment 1 Documented in Chart     Comment 2 Notify RN     No results found.  Pt feeling better. Received 2 L of NS, 16u of SQ insulin. Will d/chome. CBG now 211, VS normal. Pt is not in DKA. No N/v. tolerating POs. Overall non toxic. Pt states her mother will help her buy her insulin.   MDM          Lottie Mussel, PA 08/26/11 2313

## 2011-08-26 NOTE — ED Notes (Signed)
Pt complains of an elevated blood sugar since Saturday

## 2011-08-27 NOTE — ED Provider Notes (Signed)
Medical screening examination/treatment/procedure(s) were performed by non-physician practitioner and as supervising physician I was immediately available for consultation/collaboration.   Forbes Cellar, MD 08/27/11 [phone]

## 2011-09-03 ENCOUNTER — Emergency Department (HOSPITAL_COMMUNITY)
Admission: EM | Admit: 2011-09-03 | Discharge: 2011-09-03 | Disposition: A | Discharge status: Home / Self Care | Payer: BC Managed Care – PPO | Attending: Emergency Medicine | Admitting: Emergency Medicine

## 2011-09-03 DIAGNOSIS — Z79899 Other long term (current) drug therapy: Secondary | ICD-10-CM | POA: Insufficient documentation

## 2011-09-03 DIAGNOSIS — Z794 Long term (current) use of insulin: Secondary | ICD-10-CM | POA: Insufficient documentation

## 2011-09-03 DIAGNOSIS — Z9114 Patient's other noncompliance with medication regimen: Secondary | ICD-10-CM

## 2011-09-03 DIAGNOSIS — R5383 Other fatigue: Secondary | ICD-10-CM | POA: Insufficient documentation

## 2011-09-03 DIAGNOSIS — IMO0001 Reserved for inherently not codable concepts without codable children: Secondary | ICD-10-CM | POA: Insufficient documentation

## 2011-09-03 DIAGNOSIS — E119 Type 2 diabetes mellitus without complications: Secondary | ICD-10-CM | POA: Insufficient documentation

## 2011-09-03 DIAGNOSIS — R5381 Other malaise: Secondary | ICD-10-CM | POA: Insufficient documentation

## 2011-09-03 DIAGNOSIS — R1110 Vomiting, unspecified: Secondary | ICD-10-CM | POA: Insufficient documentation

## 2011-09-03 DIAGNOSIS — R739 Hyperglycemia, unspecified: Secondary | ICD-10-CM

## 2011-09-03 LAB — URINALYSIS, ROUTINE W REFLEX MICROSCOPIC
Nitrite: NEGATIVE
Protein, ur: NEGATIVE mg/dL
Specific Gravity, Urine: 1.034 — ABNORMAL HIGH (ref 1.005–1.030)
Urobilinogen, UA: 0.2 mg/dL (ref 0.0–1.0)

## 2011-09-03 LAB — GLUCOSE, CAPILLARY
Glucose-Capillary: 585 mg/dL (ref 70–99)
Glucose-Capillary: 357 mg/dL — ABNORMAL HIGH (ref 70–99)

## 2011-09-03 LAB — BLOOD GAS, VENOUS
Bicarbonate: 28.2 mEq/L — ABNORMAL HIGH (ref 20.0–24.0)
TCO2: 25.3 mmol/L (ref 0–100)
pCO2, Ven: 51.7 mmHg — ABNORMAL HIGH (ref 45.0–50.0)
pH, Ven: 7.357 — ABNORMAL HIGH (ref 7.250–7.300)
pO2, Ven: 38.1 mmHg (ref 30.0–45.0)

## 2011-09-03 LAB — DIFFERENTIAL
Basophils Relative: 0 % (ref 0–1)
Eosinophils Absolute: 0.1 10*3/uL (ref 0.0–0.7)
Lymphs Abs: 2.9 10*3/uL (ref 0.7–4.0)
Neutrophils Relative %: 53 % (ref 43–77)

## 2011-09-03 LAB — COMPREHENSIVE METABOLIC PANEL
ALT: 9 U/L (ref 0–35)
Albumin: 4 g/dL (ref 3.5–5.2)
Alkaline Phosphatase: 78 U/L (ref 39–117)
Glucose, Bld: 399 mg/dL — ABNORMAL HIGH (ref 70–99)
Potassium: 3.8 mEq/L (ref 3.5–5.1)
Sodium: 131 mEq/L — ABNORMAL LOW (ref 135–145)
Total Protein: 7.6 g/dL (ref 6.0–8.3)

## 2011-09-03 LAB — CBC
MCH: 30.4 pg (ref 26.0–34.0)
Platelets: 297 10*3/uL (ref 150–400)
RBC: 4.6 MIL/uL (ref 3.87–5.11)

## 2011-09-03 LAB — POCT PREGNANCY, URINE: Preg Test, Ur: NEGATIVE

## 2011-09-03 LAB — URINE MICROSCOPIC-ADD ON

## 2011-09-03 MED ORDER — SODIUM CHLORIDE 0.9 % IV BOLUS (SEPSIS)
1000.0000 mL | Freq: Once | INTRAVENOUS | Status: AC
  Administered 2011-09-03: 1000 mL via INTRAVENOUS

## 2011-09-03 MED ORDER — ACETAMINOPHEN 500 MG PO TABS
1000.0000 mg | ORAL_TABLET | Freq: Once | ORAL | Status: AC
  Administered 2011-09-03: 1000 mg via ORAL
  Filled 2011-09-03: qty 2

## 2011-09-03 MED ORDER — KETOROLAC TROMETHAMINE 30 MG/ML IJ SOLN
30.0000 mg | Freq: Once | INTRAMUSCULAR | Status: AC
  Administered 2011-09-03: 30 mg via INTRAVENOUS
  Filled 2011-09-03: qty 1

## 2011-09-03 MED ORDER — INSULIN GLARGINE 100 UNIT/ML ~~LOC~~ SOLN: SUBCUTANEOUS | Status: DC

## 2011-09-03 MED ORDER — INSULIN ASPART 100 UNIT/ML ~~LOC~~ SOLN
8.0000 [IU] | Freq: Once | SUBCUTANEOUS | Status: DC
  Filled 2011-09-03: qty 1

## 2011-09-03 MED ORDER — TRAMADOL HCL 50 MG PO TABS: 50.0000 mg | ORAL_TABLET | Freq: Four times a day (QID) | ORAL | Status: AC | PRN

## 2011-09-03 MED ORDER — INSULIN REGULAR HUMAN 100 UNIT/ML IJ SOLN: 8.0000 [IU] | Freq: Once | INTRAMUSCULAR | Status: DC

## 2011-09-03 MED ORDER — INSULIN ASPART 100 UNIT/ML ~~LOC~~ SOLN: SUBCUTANEOUS | Status: DC

## 2011-09-03 MED ORDER — MORPHINE SULFATE 4 MG/ML IJ SOLN
4.0000 mg | Freq: Once | INTRAMUSCULAR | Status: AC
  Administered 2011-09-03: 4 mg via INTRAVENOUS
  Filled 2011-09-03: qty 1

## 2011-09-03 MED ORDER — ONDANSETRON HCL 4 MG/2ML IJ SOLN
4.0000 mg | Freq: Once | INTRAMUSCULAR | Status: AC
  Administered 2011-09-03: 4 mg via INTRAVENOUS
  Filled 2011-09-03: qty 2

## 2011-09-03 NOTE — ED Notes (Signed)
MD at bedside. 

## 2011-09-03 NOTE — ED Notes (Signed)
Made nurse aware of blood pressure

## 2011-09-03 NOTE — ED Notes (Signed)
Received report from [place], Charity fundraiser. Assumed care at this time.

## 2011-09-03 NOTE — ED Provider Notes (Signed)
PT has IDDM diagnosed at age 26. Relates she can't afford her insulin.She hasn't had any insulin in a week. Yesterday she started getting nausea, vomiting, leg cramps, abdominal cramps and SOB.  Pt laying on stretcher, looks like she feels bad. She does not appear tachypneic. Alert, looks like she feels bad.  Medical screening examination/treatment/procedure(s) were conducted as a shared visit with non-physician practitioner(s) and myself.  I personally evaluated the patient during the encounter Devoria Albe, MD, Franz Dell, MD 09/03/11 1655

## 2011-09-03 NOTE — ED Provider Notes (Signed)
See prior note   Caitlyn Revuelta L Faria Casella, MD 09/03/11 2345 

## 2011-09-03 NOTE — ED Notes (Signed)
Bed:WA07<BR> Expected date:<BR> Expected time:<BR> Means of arrival:<BR> Comments:<BR> Triage 7

## 2011-09-03 NOTE — ED Provider Notes (Signed)
History     CSN: [id number]  Arrival date & time 09/03/11  1501   First MD Initiated Contact with Patient 09/03/11 1540      Chief Complaint  Patient presents with  . Extremity Weakness    leg cramps, very sleepy.  Urinary frequency, increased thirst, etc.   CBG monitor at home read "high."  Has been feeling sick since last night.     (Consider location/radiation/quality/duration/timing/severity/associated sxs/prior treatment) Patient is a 26 y.o. female presenting with vomiting. The history is provided by the patient.  Emesis  This is a recurrent problem. Episode onset: She reports generalized weakness, extremity tingling without loss of function, naussea and vomiting. She states symptoms are similar to previous "high sugar" episodes. The problem has been gradually worsening. There has been no fever. Associated symptoms include myalgias. Pertinent negatives include no chills, no diarrhea and no fever.  She reports being unable to afford her insulin and has been out for one week.   Past Medical History  Diagnosis Date  . Diabetes mellitus     Past Surgical History  Procedure Date  . Cesarean section     No family history on file.  History  Substance Use Topics  . Smoking status: Never Smoker   . Smokeless tobacco: Never Used  . Alcohol Use: No    OB History    Grav Para Term Preterm Abortions TAB SAB Ect Mult Living                  Review of Systems  Constitutional: Negative for fever and chills.  HENT: Negative.   Respiratory: Negative.   Cardiovascular: Negative.   Gastrointestinal: Positive for vomiting. Negative for diarrhea.  Genitourinary: Positive for frequency.  Musculoskeletal: Positive for myalgias.  Skin: Negative.   Neurological: Positive for weakness.    Allergies  Review of patient's allergies indicates no known allergies.  Home Medications   Current Outpatient Rx  Name Route Sig Dispense Refill  . INSULIN ASPART 100 UNIT/ML [place] SOLN  Subcutaneous Inject 10 Units into the skin 3 (three) times daily before meals. Use accordingly    . METFORMIN HCL 500 MG PO TABS Oral Take 500 mg by mouth 2 (two) times daily with a meal.       BP 112/76  Pulse 93  Temp(Src) 98.3 F (36.8 C) (Oral)  Resp 18  SpO2 96%  Physical Exam  Constitutional: She appears well-developed and well-nourished.       No active vomiting. She appears fatigued but nontoxic.  HENT:  Head: Normocephalic.  Neck: Normal range of motion. Neck supple.  Cardiovascular: Normal rate and regular rhythm.   Pulmonary/Chest: Effort normal and breath sounds normal.  Abdominal: Soft. Bowel sounds are normal. There is no tenderness. There is no rebound and no guarding.  Musculoskeletal: Normal range of motion. She exhibits no edema.  Neurological: She is alert. No cranial nerve deficit.  Skin: Skin is warm and dry. No rash noted.  Psychiatric: She has a normal mood and affect.    ED Course  Procedures (including critical care time)  Labs Reviewed  GLUCOSE, CAPILLARY - Abnormal; Notable for the following:    Glucose-Capillary 585 (*)    All other components within normal limits  CBC - Abnormal; Notable for the following:    MCHC 36.1 (*)    All other components within normal limits  COMPREHENSIVE METABOLIC PANEL - Abnormal; Notable for the following:    Sodium 131 (*)    Chloride 91 (*)  Glucose, Bld 399 (*)    All other components within normal limits  BLOOD GAS, VENOUS - Abnormal; Notable for the following:    pH, Ven 7.357 (*)    pCO2, Ven 51.7 (*)    Bicarbonate 28.2 (*)    Acid-Base Excess 2.2 (*)    All other components within normal limits  GLUCOSE, CAPILLARY - Abnormal; Notable for the following:    Glucose-Capillary 357 (*)    All other components within normal limits  DIFFERENTIAL  PREGNANCY, URINE  POCT PREGNANCY, URINE  POCT CBG MONITORING  POCT PREGNANCY, URINE  URINALYSIS, ROUTINE W REFLEX MICROSCOPIC  BLOOD GAS, VENOUS  BLOOD  GAS, VENOUS   No results found.   No diagnosis found.    MDM  Discussed with patient's primary care doctor, Dr. Lurene Shadow, to try to arrange a care plan for regular access to insulin,  who reports that she has not seen this patient in a long time and does not consider her an active patient of her practice.         Rodena Medin, PA-C 09/03/11 2102

## 2011-09-03 NOTE — ED Notes (Signed)
CBG is 280

## 2011-09-24 ENCOUNTER — Encounter (HOSPITAL_COMMUNITY): Payer: Self-pay | Admitting: Emergency Medicine

## 2011-09-24 ENCOUNTER — Emergency Department (HOSPITAL_COMMUNITY)
Admission: EM | Admit: 2011-09-24 | Discharge: 2011-09-24 | Disposition: A | Discharge status: Home / Self Care | Payer: BC Managed Care – PPO | Attending: Emergency Medicine | Admitting: Emergency Medicine

## 2011-09-24 DIAGNOSIS — Z794 Long term (current) use of insulin: Secondary | ICD-10-CM | POA: Insufficient documentation

## 2011-09-24 DIAGNOSIS — R109 Unspecified abdominal pain: Secondary | ICD-10-CM | POA: Insufficient documentation

## 2011-09-24 DIAGNOSIS — R739 Hyperglycemia, unspecified: Secondary | ICD-10-CM

## 2011-09-24 DIAGNOSIS — R10819 Abdominal tenderness, unspecified site: Secondary | ICD-10-CM | POA: Insufficient documentation

## 2011-09-24 DIAGNOSIS — E119 Type 2 diabetes mellitus without complications: Secondary | ICD-10-CM | POA: Insufficient documentation

## 2011-09-24 DIAGNOSIS — Z79899 Other long term (current) drug therapy: Secondary | ICD-10-CM | POA: Insufficient documentation

## 2011-09-24 LAB — URINALYSIS, ROUTINE W REFLEX MICROSCOPIC
Ketones, ur: 15 mg/dL — AB
Leukocytes, UA: NEGATIVE
Nitrite: NEGATIVE
Protein, ur: NEGATIVE mg/dL
Urobilinogen, UA: 0.2 mg/dL (ref 0.0–1.0)
pH: 6 (ref 5.0–8.0)

## 2011-09-24 LAB — DIFFERENTIAL
Basophils Relative: 1 % (ref 0–1)
Eosinophils Absolute: 0.1 10*3/uL (ref 0.0–0.7)
Eosinophils Relative: 2 % (ref 0–5)
Monocytes Absolute: 0.6 10*3/uL (ref 0.1–1.0)
Monocytes Relative: 8 % (ref 3–12)
Neutro Abs: 3.8 10*3/uL (ref 1.7–7.7)

## 2011-09-24 LAB — COMPREHENSIVE METABOLIC PANEL
AST: 10 U/L (ref 0–37)
Alkaline Phosphatase: 123 U/L — ABNORMAL HIGH (ref 39–117)
BUN: 13 mg/dL (ref 6–23)
CO2: 23 mEq/L (ref 19–32)
Chloride: 86 mEq/L — ABNORMAL LOW (ref 96–112)
Creatinine, Ser: 0.64 mg/dL (ref 0.50–1.10)
GFR calc non Af Amer: >90 mL/min (ref >90)
Total Bilirubin: 0.2 mg/dL — ABNORMAL LOW (ref 0.3–1.2)

## 2011-09-24 LAB — CBC
MCH: 29.5 pg (ref 26.0–34.0)
Platelets: 308 10*3/uL (ref 150–400)
RBC: 4.4 MIL/uL (ref 3.87–5.11)
RDW: 12.4 % (ref 11.5–15.5)

## 2011-09-24 LAB — GLUCOSE, CAPILLARY: Glucose-Capillary: 229 mg/dL — ABNORMAL HIGH (ref 70–99)

## 2011-09-24 LAB — URINE MICROSCOPIC-ADD ON

## 2011-09-24 MED ORDER — ONDANSETRON HCL 4 MG/2ML IJ SOLN
4.0000 mg | Freq: Once | INTRAMUSCULAR | Status: AC
  Administered 2011-09-24: 4 mg via INTRAVENOUS
  Filled 2011-09-24: qty 2

## 2011-09-24 MED ORDER — MORPHINE SULFATE 4 MG/ML IJ SOLN
4.0000 mg | Freq: Once | INTRAMUSCULAR | Status: AC
  Administered 2011-09-24: 4 mg via INTRAVENOUS
  Filled 2011-09-24: qty 1

## 2011-09-24 MED ORDER — SODIUM CHLORIDE 0.9 % IV BOLUS (SEPSIS)
1000.0000 mL | Freq: Once | INTRAVENOUS | Status: AC
  Administered 2011-09-24: 1000 mL via INTRAVENOUS

## 2011-09-24 MED ORDER — DEXTROSE-NACL 5-0.45 % IV SOLN: INTRAVENOUS | Status: DC

## 2011-09-24 MED ORDER — SODIUM CHLORIDE 0.9 % IV BOLUS (SEPSIS)
500.0000 mL | Freq: Once | INTRAVENOUS | Status: AC
  Administered 2011-09-24: 20:00:00 via INTRAVENOUS

## 2011-09-24 MED ORDER — DEXTROSE 50 % IV SOLN: 25.0000 mL | INTRAVENOUS | Status: DC | PRN

## 2011-09-24 MED ORDER — SODIUM CHLORIDE 0.9 % IV SOLN
INTRAVENOUS | Status: DC
  Administered 2011-09-24: 3.4 [IU]/h via INTRAVENOUS
  Filled 2011-09-24: qty 1

## 2011-09-24 MED ORDER — INSULIN REGULAR BOLUS VIA INFUSION
0.0000 [IU] | Freq: Three times a day (TID) | INTRAVENOUS | Status: DC
  Filled 2011-09-24: qty 10

## 2011-09-24 MED ORDER — SODIUM CHLORIDE 0.9 % IV SOLN
INTRAVENOUS | Status: DC
  Administered 2011-09-24: 21:00:00 via INTRAVENOUS

## 2011-09-24 MED ORDER — INSULIN REGULAR HUMAN 100 UNIT/ML IJ SOLN
15.0000 [IU] | Freq: Once | INTRAMUSCULAR | Status: AC
  Administered 2011-09-24: 15 [IU] via INTRAVENOUS
  Filled 2011-09-24: qty 0.15

## 2011-09-24 MED ORDER — FENTANYL CITRATE 0.05 MG/ML IJ SOLN
50.0000 ug | Freq: Once | INTRAMUSCULAR | Status: AC
  Administered 2011-09-24: 50 ug via INTRAVENOUS
  Filled 2011-09-24: qty 2

## 2011-09-24 NOTE — ED Provider Notes (Signed)
History     CSN: [id number]  Arrival date & time 09/24/11  1705   First MD Initiated Contact with Patient 09/24/11 1824      Chief Complaint  Patient presents with  . Hyperglycemia  . Abdominal Pain    (Consider location/radiation/quality/duration/timing/severity/associated sxs/prior treatment) HPI  Patient with recent hx of HONK with poorly controlled diabetes presents to ER after going to see her PCP today for a sick visit with concern of a 2 day hx of elevated blood sugar readings at home as well has having lower abdominal cramping and lower extremity cramps that she states is a usual associated symptoms when her blood sugars are elevated. Patient states that after seeing Dr. Lelon Perla in office with blood sugar unable to be read because it was too high, was sent to ER for further evaluation. Patient denies fevers, chills, CP, SOB, n/v/d, dysuria, hematuria or blood in stool. Denies aggravating or alleviating factors. Symptoms were acute onset and unchanging.   Past Medical History  Diagnosis Date  . Diabetes mellitus     Past Surgical History  Procedure Date  . Cesarean section     Family History  Problem Relation Age of Onset  . Hypertension Mother     History  Substance Use Topics  . Smoking status: Never Smoker   . Smokeless tobacco: Never Used  . Alcohol Use: No    OB History    Grav Para Term Preterm Abortions TAB SAB Ect Mult Living                  Review of Systems  All other systems reviewed and are negative.    Allergies  Review of patient's allergies indicates no known allergies.  Home Medications   Current Outpatient Rx  Name Route Sig Dispense Refill  . IBUPROFEN 200 MG PO TABS Oral Take 400 mg by mouth every 6 (six) hours as needed. PAIN    . INSULIN ASPART 100 UNIT/ML [place] SOLN Subcutaneous Inject 10 Units into the skin 3 (three) times daily before meals. Use accordingly    . INSULIN GLARGINE 100 UNIT/ML [place] SOLN Subcutaneous Inject 10 Units  into the skin at bedtime. Insulin per regular dose    . METFORMIN HCL 500 MG PO TABS Oral Take 500 mg by mouth 2 (two) times daily with a meal.       BP 108/65  Pulse 84  Temp(Src) 98.3 F (36.8 C) (Oral)  Resp 18  SpO2 99%  LMP 09/18/2011  Physical Exam  Nursing note and vitals reviewed. Constitutional: She is oriented to person, place, and time. She appears well-developed and well-nourished. No distress.  HENT:  Head: Normocephalic and atraumatic.  Eyes: Conjunctivae are normal.  Neck: Normal range of motion. Neck supple.  Cardiovascular: Normal rate, regular rhythm, normal heart sounds and intact distal pulses.  Exam reveals no gallop and no friction rub.   No murmur heard. Pulmonary/Chest: Effort normal and breath sounds normal. No respiratory distress. She has no wheezes. She has no rales. She exhibits no tenderness.  Abdominal: Soft. Bowel sounds are normal. She exhibits no distension and no mass. There is tenderness. There is no rebound and no guarding.       Diffuse TTP. No guarding, no rigidity or peritoneal signs.   Musculoskeletal: Normal range of motion. She exhibits no edema and no tenderness.  Neurological: She is alert and oriented to person, place, and time.  Skin: Skin is warm and dry. No rash noted. She is not  diaphoretic. No erythema.  Psychiatric: She has a normal mood and affect.    ED Course  Procedures (including critical care time)  IV fluids and glucose stabilizer protocol started.   Labs Reviewed  GLUCOSE, CAPILLARY - Abnormal; Notable for the following:    Glucose-Capillary >600 (*)    All other components within normal limits  COMPREHENSIVE METABOLIC PANEL - Abnormal; Notable for the following:    Sodium 125 (*)    Chloride 86 (*)    Glucose, Bld 598 (*)    Calcium 10.6 (*)    Alkaline Phosphatase 123 (*)    Total Bilirubin 0.2 (*)    All other components within normal limits  URINALYSIS, ROUTINE W REFLEX MICROSCOPIC - Abnormal; Notable for  the following:    Specific Gravity, Urine 1.033 (*)    Glucose, UA >1000 (*)    Ketones, ur 15 (*)    All other components within normal limits  CBC  PREGNANCY, URINE  DIFFERENTIAL  LIPASE, BLOOD  URINE MICROSCOPIC-ADD ON   No results found.   No diagnosis found.    MDM  Patient to be followed by Dr. Denton Lank for hyperglycemia with dispo pending.     Medical screening examination/treatment/procedure(s) were conducted as a shared visit with non-physician practitioner(s) and myself.  I personally evaluated the patient during the encounter  Recheck abd soft nt. No nv. No fevers. Feels improved. Has recd 2.5 liters ns, insulin gtt off, taking po fluids. hco3 normal. bs 229. Feel stable for d/c. Pt says has adequate insulin at home. rec pcp f/u tomorrow.     Jenness Corner, [place] 09/24/11 1944  Suzi Roots, MD 09/24/11 2239

## 2011-09-24 NOTE — ED Notes (Signed)
Denton Lank, MD at bedside. Stop insulin gtt per Dr. Denton Lank.

## 2011-09-24 NOTE — ED Notes (Signed)
Patient states she was getting High reading on home glucometer, went to doctor for sick visit and doctor also unable to determine blood sugar level in office, sent patient here to Emergency Room. Patient also complains of sharp abdominal pain and leg cramps worsening today.

## 2011-09-24 NOTE — ED Notes (Signed)
Patient states that her left leg is still hurting. Rates pain 8 on 0-10 scale. Describes pain as "burning and tingling sensation." States this has been going on "for a couple of months and is getting worser." Also states she is having itching and irritation that has been going on for approx 2 weeks.

## 2011-11-17 ENCOUNTER — Encounter (HOSPITAL_COMMUNITY): Payer: Self-pay

## 2011-11-17 ENCOUNTER — Emergency Department (HOSPITAL_COMMUNITY)
Admission: EM | Admit: 2011-11-17 | Discharge: 2011-11-18 | Disposition: A | Discharge status: Home / Self Care | Payer: BC Managed Care – PPO | Attending: Emergency Medicine | Admitting: Emergency Medicine

## 2011-11-17 DIAGNOSIS — R231 Pallor: Secondary | ICD-10-CM | POA: Insufficient documentation

## 2011-11-17 DIAGNOSIS — E162 Hypoglycemia, unspecified: Secondary | ICD-10-CM

## 2011-11-17 DIAGNOSIS — E1169 Type 2 diabetes mellitus with other specified complication: Secondary | ICD-10-CM | POA: Insufficient documentation

## 2011-11-17 DIAGNOSIS — Z794 Long term (current) use of insulin: Secondary | ICD-10-CM | POA: Insufficient documentation

## 2011-11-17 DIAGNOSIS — N920 Excessive and frequent menstruation with regular cycle: Secondary | ICD-10-CM | POA: Insufficient documentation

## 2011-11-17 LAB — PREGNANCY, URINE: Preg Test, Ur: NEGATIVE

## 2011-11-17 LAB — URINALYSIS, ROUTINE W REFLEX MICROSCOPIC
Bilirubin Urine: NEGATIVE
Ketones, ur: NEGATIVE mg/dL
Nitrite: NEGATIVE
pH: 6.5 (ref 5.0–8.0)

## 2011-11-17 LAB — CBC
HCT: 39.8 % (ref 36.0–46.0)
MCHC: 35.9 g/dL (ref 30.0–36.0)
RDW: 12.2 % (ref 11.5–15.5)

## 2011-11-17 LAB — BLOOD GAS, VENOUS
FIO2: 0.21 %
Patient temperature: 98.6
TCO2: 25.4 mmol/L (ref 0–100)
pH, Ven: 7.333 — ABNORMAL HIGH (ref 7.250–7.300)

## 2011-11-17 LAB — URINE MICROSCOPIC-ADD ON

## 2011-11-17 LAB — POCT I-STAT, CHEM 8
BUN: 14 mg/dL (ref 6–23)
Calcium, Ion: 1.25 mmol/L (ref 1.12–1.32)
Chloride: 97 mEq/L (ref 96–112)
Creatinine, Ser: 0.6 mg/dL (ref 0.50–1.10)
Glucose, Bld: 621 mg/dL (ref 70–99)
HCT: 45 % (ref 36.0–46.0)

## 2011-11-17 LAB — GLUCOSE, CAPILLARY: Glucose-Capillary: 424 mg/dL — ABNORMAL HIGH (ref 70–99)

## 2011-11-17 MED ORDER — SODIUM CHLORIDE 0.9 % IV BOLUS (SEPSIS)
1000.0000 mL | Freq: Once | INTRAVENOUS | Status: AC
  Administered 2011-11-17: 1000 mL via INTRAVENOUS

## 2011-11-17 MED ORDER — SODIUM CHLORIDE 0.9 % IV SOLN
1000.0000 mL | Freq: Once | INTRAVENOUS | Status: AC
  Administered 2011-11-17: 1000 mL via INTRAVENOUS

## 2011-11-17 MED ORDER — SODIUM CHLORIDE 0.9 % IV SOLN
1000.0000 mL | INTRAVENOUS | Status: DC
  Administered 2011-11-17: 1000 mL via INTRAVENOUS

## 2011-11-17 MED ORDER — SODIUM CHLORIDE 0.9 % IV SOLN
INTRAVENOUS | Status: DC
  Administered 2011-11-17: 5.6 [IU]/h via INTRAVENOUS
  Filled 2011-11-17: qty 1

## 2011-11-17 NOTE — ED Notes (Signed)
HX of DKA- compliant with meds- 10 units of novalog at 1500 today CBG critical high- Charge Redmond Baseman RN made aware of pt current staus

## 2011-11-17 NOTE — ED Provider Notes (Signed)
History     CSN: [id number]  Arrival date & time 11/17/11  1959   First MD Initiated Contact with Patient 11/17/11 2007      Chief Complaint  Patient presents with  . Hyperglycemia  . Nausea  . Emesis  . Abdominal Pain    (Consider location/radiation/quality/duration/timing/severity/associated sxs/prior treatment) HPI Comments: Patient is poorly controlled insulin-dependent diabetic, who states her sugars have been over 500 the past week.  She's also been having severe menstrual cramps.  Tonight she developed nausea and vomiting  Patient is a 26 y.o. female presenting with vomiting and abdominal pain. The history is provided by the patient.  Emesis  This is a recurrent problem. The current episode started more than 1 week ago. The problem has not changed since onset.The emesis has an appearance of bilious material. There has been no fever. Pertinent negatives include no cough, no fever, no headaches and no myalgias.  Abdominal Pain The primary symptoms of the illness include nausea. The primary symptoms of the illness do not include fever or dysuria.  Symptoms associated with the illness do not include frequency or back pain.    Past Medical History  Diagnosis Date  . Diabetes mellitus     Past Surgical History  Procedure Date  . Cesarean section     Family History  Problem Relation Age of Onset  . Hypertension Mother     History  Substance Use Topics  . Smoking status: Never Smoker   . Smokeless tobacco: Never Used  . Alcohol Use: No    OB History    Grav Para Term Preterm Abortions TAB SAB Ect Mult Living                  Review of Systems  Constitutional: Negative for fever and appetite change.  HENT: Negative for rhinorrhea.   Respiratory: Negative for cough.   Cardiovascular: Negative for chest pain.  Gastrointestinal: Positive for nausea.  Genitourinary: Positive for menstrual problem. Negative for dysuria and frequency.  Musculoskeletal: Negative  for myalgias and back pain.  Skin: Positive for pallor.  Neurological: Negative for dizziness, numbness and headaches.    Allergies  Review of patient's allergies indicates no known allergies.  Home Medications   Current Outpatient Rx  Name Route Sig Dispense Refill  . IBUPROFEN 200 MG PO TABS Oral Take 400 mg by mouth every 6 (six) hours as needed. PAIN    . INSULIN ASPART 100 UNIT/ML [place] SOLN Subcutaneous Inject 10 Units into the skin 3 (three) times daily before meals. Use accordingly    . INSULIN GLARGINE 100 UNIT/ML [place] SOLN Subcutaneous Inject 10 Units into the skin at bedtime. Insulin per regular dose    . METFORMIN HCL 500 MG PO TABS Oral Take 500 mg by mouth 2 (two) times daily with a meal.     . ADULT MULTIVITAMIN W/MINERALS CH Oral Take 1 tablet by mouth daily.    Marland Kitchen PREGABALIN 75 MG PO CAPS Oral Take 75 mg by mouth 2 (two) times daily.      BP 93/61  Pulse 78  Temp(Src) 98.5 F (36.9 C) (Oral)  Resp 15  Ht 5\' 7"  (1.702 m)  Wt 165 lb (74.844 kg)  BMI 25.84 kg/m2  SpO2 98%  Physical Exam  Constitutional: She is oriented to person, place, and time. She appears well-developed and well-nourished.  HENT:  Head: Normocephalic.  Eyes: Pupils are equal, round, and reactive to light.  Neck: Normal range of motion.  Cardiovascular: Normal  rate.   Pulmonary/Chest: Effort normal.  Neurological: She is alert and oriented to person, place, and time.  Skin: Skin is warm. There is pallor.    ED Course  Procedures (including critical care time)  Labs Reviewed  BLOOD GAS, VENOUS - Abnormal; Notable for the following:    pH, Ven 7.333 (*)    pCO2, Ven 53.1 (*)    pO2, Ven 29.5 (*)    Bicarbonate 27.4 (*)    All other components within normal limits  URINALYSIS, ROUTINE W REFLEX MICROSCOPIC - Abnormal; Notable for the following:    Specific Gravity, Urine 1.031 (*)    Glucose, UA >1000 (*)    All other components within normal limits  POCT I-STAT, CHEM 8 - Abnormal;  Notable for the following:    Sodium 131 (*)    Glucose, Bld 621 (*)    Hemoglobin 15.3 (*)    All other components within normal limits  GLUCOSE, CAPILLARY - Abnormal; Notable for the following:    Glucose-Capillary 424 (*)    All other components within normal limits  GLUCOSE, CAPILLARY - Abnormal; Notable for the following:    Glucose-Capillary 286 (*)    All other components within normal limits  GLUCOSE, CAPILLARY - Abnormal; Notable for the following:    Glucose-Capillary 253 (*)    All other components within normal limits  CBC  PREGNANCY, URINE  URINE MICROSCOPIC-ADD ON   No results found.   1. Hypoglycemia   2. Menorrhagia with regular cycle     Recent blood sugar time of discharge is 210.  She presented with a blood sugar of over 600, but not an acidosis  MDM  Hyperglycemia        Arman Filter, NP 11/18/11 0315  Arman Filter, NP 11/18/11 6295

## 2011-11-17 NOTE — ED Notes (Signed)
CBG performed in triage - critical high

## 2011-11-18 LAB — GLUCOSE, CAPILLARY: Glucose-Capillary: 286 mg/dL — ABNORMAL HIGH (ref 70–99)

## 2011-11-18 MED ORDER — KETOROLAC TROMETHAMINE 30 MG/ML IJ SOLN: 30.0000 mg | Freq: Once | INTRAMUSCULAR | Status: DC

## 2011-11-18 MED ORDER — KETOROLAC TROMETHAMINE 30 MG/ML IJ SOLN
INTRAMUSCULAR | Status: AC
  Administered 2011-11-18: 02:00:00
  Filled 2011-11-18: qty 1

## 2011-11-18 NOTE — Discharge Instructions (Signed)
Low Blood Sugar Low blood sugar (hypoglycemia) means that the level of sugar in your blood is lower than it should be. Signs of low blood sugar include:  Getting sweaty.   Feeling hungry.   Feeling dizzy or weak.   Feeling sleepier than normal.   Feeling nervous.   Headaches.   Having a fast heartbeat.  Low blood sugar can happen fast and can be an emergency. Your doctor can do tests to check your blood sugar level. You can have low blood sugar and not have diabetes. HOME CARE  Check your blood sugar as told by your doctor. If it is less than 70 mg/dl or as told by your doctor, take 1 of the following:   3 to 4 glucose tablets.    cup clear juice.    cup soda pop, not diet.   1 cup milk.   5 to 6 hard candies.   Recheck blood sugar after 15 minutes. Repeat until it is at the right level.   Eat a snack if it is more than 1 hour until the next meal.   Only take medicine as told by your doctor.   Do not skip meals. Eat on time.   Do not drink alcohol except with meals.   Check your blood glucose before driving.   Check your blood glucose before and after exercise.   Always carry treatment with you, such as glucose pills.   Always wear a medical alert bracelet if you have diabetes.  GET HELP RIGHT AWAY IF:   Your blood glucose goes below 70 mg/dl or as told by your doctor, and you:   Are confused.   Are not able to swallow.   Pass out (faint).   You cannot treat yourself. You may need someone to help you.   You have low blood sugar problems often.   You have problems from your medicines.   You are not feeling better after 3 to 4 days.   You have vision changes.  MAKE SURE YOU:   Understand these instructions.   Will watch this condition.   Will get help right away if you are not doing well or get worse.  Document Released: 10/30/2009 Document Revised: 07/25/2011 Document Reviewed: 10/30/2009 [facility] Patient Information 2012 [place],  [place]. Please monitor your blood sugars carefully over the next week.  Follow up with Dr. Talmage Nap by phone today

## 2011-11-18 NOTE — ED Notes (Signed)
Patient given discharge instructions, information, prescriptions, and diet order. Patient states that they adequately understand discharge information given and to return to ED if symptoms return or worsen.     

## 2011-11-18 NOTE — ED Notes (Signed)
Patient switched to D5 and 1/2 normal saline. 125 ml/hr

## 2011-11-19 NOTE — ED Provider Notes (Signed)
Medical screening examination/treatment/procedure(s) were performed by non-physician practitioner and as supervising physician I was immediately available for consultation/collaboration.  Doug Sou, MD 11/19/11 [phone]

## 2012-02-17 ENCOUNTER — Emergency Department (HOSPITAL_COMMUNITY)
Admission: EM | Admit: 2012-02-17 | Discharge: 2012-02-17 | Disposition: A | Discharge status: Home / Self Care | Payer: BC Managed Care – PPO | Attending: Emergency Medicine | Admitting: Emergency Medicine

## 2012-02-17 ENCOUNTER — Encounter (HOSPITAL_COMMUNITY): Payer: Self-pay | Admitting: *Deleted

## 2012-02-17 DIAGNOSIS — Z79899 Other long term (current) drug therapy: Secondary | ICD-10-CM | POA: Insufficient documentation

## 2012-02-17 DIAGNOSIS — R197 Diarrhea, unspecified: Secondary | ICD-10-CM | POA: Insufficient documentation

## 2012-02-17 DIAGNOSIS — R5381 Other malaise: Secondary | ICD-10-CM | POA: Insufficient documentation

## 2012-02-17 DIAGNOSIS — R739 Hyperglycemia, unspecified: Secondary | ICD-10-CM

## 2012-02-17 DIAGNOSIS — R110 Nausea: Secondary | ICD-10-CM | POA: Insufficient documentation

## 2012-02-17 DIAGNOSIS — Z794 Long term (current) use of insulin: Secondary | ICD-10-CM | POA: Insufficient documentation

## 2012-02-17 DIAGNOSIS — E119 Type 2 diabetes mellitus without complications: Secondary | ICD-10-CM | POA: Insufficient documentation

## 2012-02-17 DIAGNOSIS — G609 Hereditary and idiopathic neuropathy, unspecified: Secondary | ICD-10-CM | POA: Insufficient documentation

## 2012-02-17 HISTORY — DX: Polyneuropathy, unspecified: G62.9

## 2012-02-17 LAB — GLUCOSE, CAPILLARY
Glucose-Capillary: >600 mg/dL (ref 70–99)
Glucose-Capillary: 459 mg/dL — ABNORMAL HIGH (ref 70–99)
Glucose-Capillary: 335 mg/dL — ABNORMAL HIGH (ref 70–99)

## 2012-02-17 LAB — COMPREHENSIVE METABOLIC PANEL
Albumin: 4.1 g/dL (ref 3.5–5.2)
BUN: 11 mg/dL (ref 6–23)
Creatinine, Ser: 0.59 mg/dL (ref 0.50–1.10)
GFR calc Af Amer: >90 mL/min (ref >90)
Glucose, Bld: 723 mg/dL (ref 70–99)
Total Bilirubin: 0.3 mg/dL (ref 0.3–1.2)
Total Protein: 7.9 g/dL (ref 6.0–8.3)

## 2012-02-17 LAB — BLOOD GAS, VENOUS
O2 Saturation: 27.6 %
Patient temperature: 98.6
pH, Ven: 7.328 — ABNORMAL HIGH (ref 7.250–7.300)
pO2, Ven: 21.1 mmHg — CL (ref 30.0–45.0)

## 2012-02-17 LAB — URINALYSIS, ROUTINE W REFLEX MICROSCOPIC
Bilirubin Urine: NEGATIVE
Ketones, ur: 15 mg/dL — AB
Leukocytes, UA: NEGATIVE
Nitrite: NEGATIVE
Urobilinogen, UA: 0.2 mg/dL (ref 0.0–1.0)
pH: 6 (ref 5.0–8.0)

## 2012-02-17 LAB — CBC WITH DIFFERENTIAL/PLATELET
Basophils Relative: 0 % (ref 0–1)
Eosinophils Absolute: 0.1 10*3/uL (ref 0.0–0.7)
HCT: 40.9 % (ref 36.0–46.0)
Hemoglobin: 14.7 g/dL (ref 12.0–15.0)
Lymphs Abs: 2 10*3/uL (ref 0.7–4.0)
MCH: 30.6 pg (ref 26.0–34.0)
MCHC: 35.9 g/dL (ref 30.0–36.0)
MCV: 85 fL (ref 78.0–100.0)
Monocytes Absolute: 0.3 10*3/uL (ref 0.1–1.0)
Monocytes Relative: 6 % (ref 3–12)

## 2012-02-17 LAB — POCT I-STAT, CHEM 8
Calcium, Ion: 1.23 mmol/L (ref 1.12–1.32)
Glucose, Bld: 298 mg/dL — ABNORMAL HIGH (ref 70–99)
HCT: 40 % (ref 36.0–46.0)
Hemoglobin: 13.6 g/dL (ref 12.0–15.0)
TCO2: 23 mmol/L (ref 0–100)

## 2012-02-17 LAB — URINE MICROSCOPIC-ADD ON

## 2012-02-17 MED ORDER — DEXTROSE-NACL 5-0.45 % IV SOLN: INTRAVENOUS | Status: DC

## 2012-02-17 MED ORDER — SODIUM CHLORIDE 0.9 % IV SOLN
INTRAVENOUS | Status: DC
  Administered 2012-02-17: 13:00:00 via INTRAVENOUS

## 2012-02-17 MED ORDER — SODIUM CHLORIDE 0.9 % IV SOLN
INTRAVENOUS | Status: DC
  Administered 2012-02-17: 4 [IU]/h via INTRAVENOUS
  Filled 2012-02-17: qty 1

## 2012-02-17 MED ORDER — DEXTROSE 50 % IV SOLN: 25.0000 mL | INTRAVENOUS | Status: DC | PRN

## 2012-02-17 MED ORDER — INSULIN REGULAR BOLUS VIA INFUSION
0.0000 [IU] | Freq: Three times a day (TID) | INTRAVENOUS | Status: DC
  Filled 2012-02-17: qty 10

## 2012-02-17 MED ORDER — MORPHINE SULFATE 4 MG/ML IJ SOLN
4.0000 mg | Freq: Once | INTRAMUSCULAR | Status: AC
  Administered 2012-02-17: 4 mg via INTRAVENOUS
  Filled 2012-02-17: qty 1

## 2012-02-17 MED ORDER — SODIUM CHLORIDE 0.9 % IV BOLUS (SEPSIS)
1000.0000 mL | Freq: Once | INTRAVENOUS | Status: AC
  Administered 2012-02-17: 1000 mL via INTRAVENOUS

## 2012-02-17 MED ORDER — ONDANSETRON HCL 4 MG/2ML IJ SOLN
4.0000 mg | Freq: Once | INTRAMUSCULAR | Status: AC
  Administered 2012-02-17: 4 mg via INTRAVENOUS
  Filled 2012-02-17: qty 2

## 2012-02-17 NOTE — Discharge Instructions (Signed)
Your blood glucose was quite elevated today at 723. Your glucose came down well with fluids. You do not appear to be in DKA today. Please take your insulin as prescribed. Call your endocrinologist in the morning to make a follow up as soon as possible. If you have persistently elevated sugars, you develop nausea, vomiting, abdominal pain, confusion, or any other worrisome symptoms, return to the ED.  Hyperglycemia Hyperglycemia occurs when the glucose (sugar) in your blood is too high. Hyperglycemia can happen for many reasons, but it most often happens to people who do not know they have diabetes or are not managing their diabetes properly.  CAUSES  Whether you have diabetes or not, there are other causes of hyperglycemia. Hyperglycemia can occur when you have diabetes, but it can also occur in other situations that you might not be as aware of, such as: Diabetes  If you have diabetes and are having problems controlling your blood glucose, hyperglycemia could occur because of some of the following reasons:   Not following your meal plan.   Not taking your diabetes medications or not taking it properly.   Exercising less or doing less activity than you normally do.   Being sick.  Pre-diabetes  This cannot be ignored. Before people develop Type 2 diabetes, they almost always have "pre-diabetes." This is when your blood glucose levels are higher than normal, but not yet high enough to be diagnosed as diabetes. Research has shown that some long-term damage to the body, especially the heart and circulatory system, may already be occurring during pre-diabetes. If you take action to manage your blood glucose when you have pre-diabetes, you may delay or prevent Type 2 diabetes from developing.  Stress  If you have diabetes, you may be "diet" controlled or on oral medications or insulin to control your diabetes. However, you may find that your blood glucose is higher than usual in the hospital whether  you have diabetes or not. This is often referred to as "stress hyperglycemia." Stress can elevate your blood glucose. This happens because of hormones put out by the body during times of stress. If stress has been the cause of your high blood glucose, it can be followed regularly by your caregiver. That way he/she can make sure your hyperglycemia does not continue to get worse or progress to diabetes.  Steroids  Steroids are medications that act on the infection fighting system (immune system) to block inflammation or infection. One side effect can be a rise in blood glucose. Most people can produce enough extra insulin to allow for this rise, but for those who cannot, steroids make blood glucose levels go even higher. It is not unusual for steroid treatments to "uncover" diabetes that is developing. It is not always possible to determine if the hyperglycemia will go away after the steroids are stopped. A special blood test called an A1c is sometimes done to determine if your blood glucose was elevated before the steroids were started.  SYMPTOMS  Thirsty.   Frequent urination.   Dry mouth.   Blurred vision.   Tired or fatigue.   Weakness.   Sleepy.   Tingling in feet or leg.  DIAGNOSIS  Diagnosis is made by monitoring blood glucose in one or all of the following ways:  A1c test. This is a chemical found in your blood.   Fingerstick blood glucose monitoring.   Laboratory results.  TREATMENT  First, knowing the cause of the hyperglycemia is important before the hyperglycemia can be  treated. Treatment may include, but is not be limited to:  Education.   Change or adjustment in medications.   Change or adjustment in meal plan.   Treatment for an illness, infection, etc.   More frequent blood glucose monitoring.   Change in exercise plan.   Decreasing or stopping steroids.   Lifestyle changes.  HOME CARE INSTRUCTIONS   Test your blood glucose as directed.   Exercise  regularly. Your caregiver will give you instructions about exercise. Pre-diabetes or diabetes which comes on with stress is helped by exercising.   Eat wholesome, balanced meals. Eat often and at regular, fixed times. Your caregiver or nutritionist will give you a meal plan to guide your sugar intake.   Being at an ideal weight is important. If needed, losing as little as 10 to 15 pounds may help improve blood glucose levels.  SEEK MEDICAL CARE IF:   You have questions about medicine, activity, or diet.   You continue to have symptoms (problems such as increased thirst, urination, or weight gain).  SEEK IMMEDIATE MEDICAL CARE IF:   You are vomiting or have diarrhea.   Your breath smells fruity.   You are breathing faster or slower.   You are very sleepy or incoherent.   You have numbness, tingling, or pain in your feet or hands.   You have chest pain.   Your symptoms get worse even though you have been following your caregiver's orders.   If you have any other questions or concerns.  Document Released: 01/29/2001 Document Revised: 07/25/2011 Document Reviewed: 03/27/2009 [facility] Patient Information 2012 [place], [place].

## 2012-02-17 NOTE — ED Notes (Signed)
Pt moved into private room, Placed on cardiac monitor. CBG obtained prior to starting IV insulin drip.

## 2012-02-17 NOTE — ED Notes (Signed)
Pt reports hyperglycemia x3 days. Taking insulin as prescribed, CBG remains elevated. Peripheral neuropathy on upper and lower extremities, numbness/tingling x3 days. Headache, nausea/diarrhea x3 days, denies vomiting.

## 2012-02-17 NOTE — ED Notes (Signed)
Pt cbg is 227

## 2012-02-17 NOTE — ED Notes (Signed)
MD at bedside. 

## 2012-02-17 NOTE — ED Notes (Signed)
IVF bolus infusing. Pt states she has h/a and her lower legs are aching. 8/10 pain. Denies nausea. Rechecked CBG.

## 2012-02-17 NOTE — ED Notes (Signed)
Pt BG improved. 244 at last check.

## 2012-02-17 NOTE — ED Provider Notes (Signed)
History     CSN: [id number]  Arrival date & time 02/17/12  1200   First MD Initiated Contact with Patient 02/17/12 1247      Chief Complaint  Patient presents with  . Hyperglycemia  . Peripheral Neuropathy    (Consider location/radiation/quality/duration/timing/severity/associated sxs/prior treatment) HPI Comments: Pt with PMH insulin dependent DM presents with hyperglycemia for past 3 days. States she is taking her insulin as prescribed but her BG has been elevated. Normal BG is in the 200s but she has had values in the 300s and 400s and her meter read high this morning. Has had to be hospitalized in past for DKA. Pt called her MD and was told to come to ED. Reports worsening of her chronic peripheral neuropathy in legs and arms over past several days, nausea without emesis, and diarrhea. Denies abd pain.  Patient is a 26 y.o. female presenting with diabetes problem. The history is provided by the patient.  Diabetes Pertinent negatives for hypoglycemia include no dizziness. Associated symptoms include fatigue, foot paresthesias, polydipsia and polyuria. Pertinent negatives for diabetes include no chest pain and no weakness. She is compliant with treatment all of the time. Her overall blood glucose range is >200 mg/dl.    Past Medical History  Diagnosis Date  . Diabetes mellitus   . Neuropathy     Past Surgical History  Procedure Date  . Cesarean section   . Cesarean section     Family History  Problem Relation Age of Onset  . Hypertension Mother     History  Substance Use Topics  . Smoking status: Never Smoker   . Smokeless tobacco: Never Used  . Alcohol Use: No    OB History    Grav Para Term Preterm Abortions TAB SAB Ect Mult Living                  Review of Systems  Constitutional: Positive for fatigue. Negative for fever, chills, appetite change and unexpected weight change.  Respiratory: Negative for cough, chest tightness and shortness of breath.     Cardiovascular: Negative for chest pain.  Gastrointestinal: Positive for nausea and diarrhea. Negative for vomiting, abdominal pain and blood in stool.  Genitourinary: Positive for polyuria and frequency.  Musculoskeletal: Negative for myalgias.  Skin: Negative for color change and rash.  Neurological: Negative for dizziness and weakness.  Hematological: Positive for polydipsia.  All other systems reviewed and are negative.    Allergies  Review of patient's allergies indicates no known allergies.  Home Medications   Current Outpatient Rx  Name Route Sig Dispense Refill  . IBUPROFEN 200 MG PO TABS Oral Take 400 mg by mouth every 6 (six) hours as needed. PAIN    . INSULIN ASPART 100 UNIT/ML [place] SOLN Subcutaneous Inject 10 Units into the skin 3 (three) times daily before meals. Use accordingly    . INSULIN GLARGINE 100 UNIT/ML  SOLN Subcutaneous Inject 10 Units into the skin at bedtime. Insulin per regular dose    . METFORMIN HCL 500 MG PO TABS Oral Take 500 mg by mouth 2 (two) times daily with a meal.     . ADULT MULTIVITAMIN W/MINERALS CH Oral Take 1 tablet by mouth daily.    Marland Kitchen PREGABALIN 75 MG PO CAPS Oral Take 75 mg by mouth 2 (two) times daily.      BP 117/76  Pulse 74  Temp 98.9 F (37.2 C) (Oral)  Resp 14  Ht 5\' 7"  (1.702 m)  Wt 165 lb (  74.844 kg)  BMI 25.84 kg/m2  SpO2 100%  LMP 02/07/2012  Physical Exam  Nursing note and vitals reviewed. Constitutional: She appears well-developed and well-nourished. No distress.  HENT:  Head: Normocephalic and atraumatic.  Mouth/Throat: Oropharynx is clear and moist. No oropharyngeal exudate.  Eyes:       Normal appearance  Neck: Normal range of motion.  Cardiovascular: Normal rate, regular rhythm and normal heart sounds.   Pulmonary/Chest: Effort normal and breath sounds normal. She exhibits no tenderness.  Abdominal: Soft. Bowel sounds are normal. There is no tenderness. There is no rebound and no guarding.   Musculoskeletal: Normal range of motion. She exhibits no edema.  Neurological: She is alert.  Skin: Skin is warm and dry. She is not diaphoretic.  Psychiatric: She has a normal mood and affect.    ED Course  Procedures (including critical care time)  Labs Reviewed  COMPREHENSIVE METABOLIC PANEL - Abnormal; Notable for the following:    Sodium 123 (*)     Chloride 86 (*)     Glucose, Bld 723 (*)     All other components within normal limits  GLUCOSE, CAPILLARY - Abnormal; Notable for the following:    Glucose-Capillary >600 (*)     All other components within normal limits  URINALYSIS, ROUTINE W REFLEX MICROSCOPIC - Abnormal; Notable for the following:    Specific Gravity, Urine 1.031 (*)     Glucose, UA >1000 (*)     Ketones, ur 15 (*)     All other components within normal limits  BLOOD GAS, VENOUS - Abnormal; Notable for the following:    pH, Ven 7.328 (*)     pO2, Ven 21.1 (*)     Bicarbonate 25.0 (*)     All other components within normal limits  URINE MICROSCOPIC-ADD ON - Abnormal; Notable for the following:    Squamous Epithelial / LPF FEW (*)     All other components within normal limits  GLUCOSE, CAPILLARY - Abnormal; Notable for the following:    Glucose-Capillary 576 (*)     All other components within normal limits  GLUCOSE, CAPILLARY - Abnormal; Notable for the following:    Glucose-Capillary 459 (*)     All other components within normal limits  GLUCOSE, CAPILLARY - Abnormal; Notable for the following:    Glucose-Capillary 335 (*)     All other components within normal limits  POCT I-STAT, CHEM 8 - Abnormal; Notable for the following:    Glucose, Bld 298 (*)     All other components within normal limits  GLUCOSE, CAPILLARY - Abnormal; Notable for the following:    Glucose-Capillary 332 (*)     All other components within normal limits  GLUCOSE, CAPILLARY - Abnormal; Notable for the following:    Glucose-Capillary 244 (*)     All other components within  normal limits  GLUCOSE, CAPILLARY - Abnormal; Notable for the following:    Glucose-Capillary 296 (*)     All other components within normal limits  CBC WITH DIFFERENTIAL  POCT PREGNANCY, URINE   No results found.   1. Hyperglycemia       MDM  Pt with hyperglycemia for the past several days despite taking her home medications. Has had no cough, dysuria, n/v symptoms that would be worrisome for infectious cause for hyperglycemia. Pt has small ketones in urine today, no anion gap. Initial BG elevated to 700, with pseudohyponatremia (corrected NA of 133). Pt placed on glucostabilizer and glucose came down well with  this; she reports feeling better. Repeat istat with nl NA, bicarb. Pt instructed that she needs to call endocrinologist in AM to make a follow up appointment regarding her elevated BGs as she may need to have insulin regimen adjusted. Reasons to return to ED discussed. Pt verbalized understanding, agreed to plan.        Grant Fontana, PA-C 02/17/12 1818

## 2012-02-20 NOTE — ED Provider Notes (Signed)
Medical screening examination/treatment/procedure(s) were performed by non-physician practitioner and as supervising physician I was immediately available for consultation/collaboration.   Danyele Smejkal M Suprina [place], DO 02/20/12 1652 

## 2012-03-30 ENCOUNTER — Inpatient Hospital Stay (HOSPITAL_COMMUNITY)
Admission: AD | Admit: 2012-03-30 | Discharge: 2012-03-30 | Discharge status: Against Medical Advice | Payer: BC Managed Care – PPO | Source: Ambulatory Visit | Attending: Obstetrics and Gynecology | Admitting: Obstetrics and Gynecology

## 2012-03-30 NOTE — MAU Note (Signed)
Called several times by registration and again by RN- pt not in lobby or area.

## 2012-05-18 ENCOUNTER — Emergency Department (HOSPITAL_COMMUNITY)
Admission: EM | Admit: 2012-05-18 | Discharge: 2012-05-18 | Disposition: A | Discharge status: Home / Self Care | Payer: BC Managed Care – PPO | Attending: Emergency Medicine | Admitting: Emergency Medicine

## 2012-05-18 ENCOUNTER — Encounter (HOSPITAL_COMMUNITY): Payer: Self-pay | Admitting: *Deleted

## 2012-05-18 DIAGNOSIS — E119 Type 2 diabetes mellitus without complications: Secondary | ICD-10-CM | POA: Insufficient documentation

## 2012-05-18 DIAGNOSIS — E1165 Type 2 diabetes mellitus with hyperglycemia: Secondary | ICD-10-CM

## 2012-05-18 DIAGNOSIS — Z79899 Other long term (current) drug therapy: Secondary | ICD-10-CM | POA: Insufficient documentation

## 2012-05-18 DIAGNOSIS — Z794 Long term (current) use of insulin: Secondary | ICD-10-CM | POA: Insufficient documentation

## 2012-05-18 LAB — URINALYSIS, ROUTINE W REFLEX MICROSCOPIC
Bilirubin Urine: NEGATIVE
Hgb urine dipstick: NEGATIVE
Nitrite: NEGATIVE
Specific Gravity, Urine: 1.037 — ABNORMAL HIGH (ref 1.005–1.030)
pH: 6 (ref 5.0–8.0)

## 2012-05-18 LAB — CBC WITH DIFFERENTIAL/PLATELET
Basophils Absolute: 0 10*3/uL (ref 0.0–0.1)
HCT: 38 % (ref 36.0–46.0)
Hemoglobin: 13.4 g/dL (ref 12.0–15.0)
Lymphocytes Relative: 39 % (ref 12–46)
Monocytes Absolute: 0.6 10*3/uL (ref 0.1–1.0)
Monocytes Relative: 7 % (ref 3–12)
Neutro Abs: 3.8 10*3/uL (ref 1.7–7.7)
RBC: 4.43 MIL/uL (ref 3.87–5.11)
WBC: 7.5 10*3/uL (ref 4.0–10.5)

## 2012-05-18 LAB — COMPREHENSIVE METABOLIC PANEL
AST: 9 U/L (ref 0–37)
Albumin: 3.8 g/dL (ref 3.5–5.2)
Alkaline Phosphatase: 81 U/L (ref 39–117)
BUN: 12 mg/dL (ref 6–23)
Potassium: 4.4 mEq/L (ref 3.5–5.1)
Sodium: 132 mEq/L — ABNORMAL LOW (ref 135–145)
Total Protein: 7.4 g/dL (ref 6.0–8.3)

## 2012-05-18 LAB — GLUCOSE, CAPILLARY
Glucose-Capillary: 472 mg/dL — ABNORMAL HIGH (ref 70–99)
Glucose-Capillary: 340 mg/dL — ABNORMAL HIGH (ref 70–99)

## 2012-05-18 LAB — URINE MICROSCOPIC-ADD ON

## 2012-05-18 MED ORDER — METOCLOPRAMIDE HCL 5 MG/ML IJ SOLN
10.0000 mg | Freq: Once | INTRAMUSCULAR | Status: AC
  Administered 2012-05-18: 10 mg via INTRAVENOUS
  Filled 2012-05-18: qty 2

## 2012-05-18 MED ORDER — FENTANYL CITRATE 0.05 MG/ML IJ SOLN
50.0000 ug | Freq: Once | INTRAMUSCULAR | Status: AC
  Administered 2012-05-18: 50 ug via INTRAVENOUS
  Filled 2012-05-18: qty 2

## 2012-05-18 MED ORDER — INSULIN ASPART 100 UNIT/ML ~~LOC~~ SOLN
10.0000 [IU] | Freq: Once | SUBCUTANEOUS | Status: AC
  Administered 2012-05-18: 10 [IU] via INTRAVENOUS
  Filled 2012-05-18: qty 1

## 2012-05-18 NOTE — ED Notes (Signed)
Pt with hx of type 1 diabetes, sent from urgent care for further eval of 500+ reading on glucometer. Pt reports polyuria, polydipsia, and generalized fatigue.

## 2012-05-18 NOTE — ED Notes (Signed)
Pt. BG 296. Pt states "this is a normal blood sugar for me".

## 2012-05-18 NOTE — ED Notes (Signed)
Pt. Reports increased urination and thirst. States takes insulin regularly. Pt states "my blood sugar going high happens quite a bit".

## 2012-05-18 NOTE — ED Provider Notes (Signed)
History     CSN: [id number]  Arrival date & time 05/18/12  4098   First MD Initiated Contact with Patient 05/18/12 2102      Chief Complaint  Patient presents with  . Polyuria  . Polydipsia  . Hyperglycemia     HPI Pt with hx of type 1 diabetes, sent from urgent care for further eval of 500+ reading on glucometer. Pt reports polyuria, polydipsia, and generalized fatigue.  Past Medical History  Diagnosis Date  . Diabetes mellitus   . Neuropathy     Past Surgical History  Procedure Date  . Cesarean section   . Cesarean section     Family History  Problem Relation Age of Onset  . Hypertension Mother     History  Substance Use Topics  . Smoking status: Never Smoker   . Smokeless tobacco: Never Used  . Alcohol Use: No    OB History    Grav Para Term Preterm Abortions TAB SAB Ect Mult Living                  Review of Systems  All other systems reviewed and are negative.    Allergies  Review of patient's allergies indicates no known allergies.  Home Medications   Current Outpatient Rx  Name Route Sig Dispense Refill  . IBUPROFEN 200 MG PO TABS Oral Take 400 mg by mouth every 6 (six) hours as needed. PAIN    . INSULIN ASPART 100 UNIT/ML [place] SOLN Subcutaneous Inject 10 Units into the skin 3 (three) times daily before meals. Use accordingly    . INSULIN GLARGINE 100 UNIT/ML [place] SOLN Subcutaneous Inject 10 Units into the skin at bedtime. Insulin per regular dose    . METFORMIN HCL 500 MG PO TABS Oral Take 500 mg by mouth 2 (two) times daily with a meal.     . PREGABALIN 75 MG PO CAPS Oral Take 75 mg by mouth 2 (two) times daily.      BP 107/60  Pulse 68  Temp 98.8 F (37.1 C)  Resp 17  SpO2 100%  Physical Exam  Nursing note and vitals reviewed. Constitutional: She is oriented to person, place, and time. She appears well-developed. No distress.  HENT:  Head: Normocephalic and atraumatic.  Eyes: Pupils are equal, round, and reactive to light.  Neck:  Normal range of motion.  Cardiovascular: Normal rate and intact distal pulses.   Pulmonary/Chest: No respiratory distress.  Abdominal: Normal appearance. She exhibits no distension. There is no tenderness. There is no rebound.  Musculoskeletal: Normal range of motion.  Neurological: She is alert and oriented to person, place, and time. No cranial nerve deficit.  Skin: Skin is warm and dry. No rash noted.  Psychiatric: She has a normal mood and affect. Her behavior is normal.    ED Course  Procedures (including critical care time)   Medications  insulin aspart (novoLOG) injection 10 Units (10 Units Intravenous Given 05/18/12 2138)  fentaNYL (SUBLIMAZE) injection 50 mcg (50 mcg Intravenous Given 05/18/12 2135)  metoCLOPramide (REGLAN) injection 10 mg (10 mg Intravenous Given 05/18/12 2134)    Labs Reviewed  GLUCOSE, CAPILLARY - Abnormal; Notable for the following:    Glucose-Capillary 472 (*)     All other components within normal limits  COMPREHENSIVE METABOLIC PANEL - Abnormal; Notable for the following:    Sodium 132 (*)     Chloride 94 (*)     Glucose, Bld 465 (*)     Total Bilirubin 0.2 (*)  All other components within normal limits  URINALYSIS, ROUTINE W REFLEX MICROSCOPIC - Abnormal; Notable for the following:    Specific Gravity, Urine 1.037 (*)     Glucose, UA >1000 (*)     All other components within normal limits  URINE MICROSCOPIC-ADD ON - Abnormal; Notable for the following:    Squamous Epithelial / LPF FEW (*)     All other components within normal limits  GLUCOSE, CAPILLARY - Abnormal; Notable for the following:    Glucose-Capillary 451 (*)     All other components within normal limits  GLUCOSE, CAPILLARY - Abnormal; Notable for the following:    Glucose-Capillary 340 (*)     All other components within normal limits  CBC WITH DIFFERENTIAL  POCT PREGNANCY, URINE   No results found.   1. Type 2 diabetes mellitus with hyperglycemia       MDM           Nelia Shi, MD 05/18/12 2207

## 2012-07-07 ENCOUNTER — Encounter (HOSPITAL_COMMUNITY): Payer: Self-pay | Admitting: *Deleted

## 2012-07-07 ENCOUNTER — Emergency Department (HOSPITAL_COMMUNITY)
Admission: EM | Admit: 2012-07-07 | Discharge: 2012-07-07 | Disposition: A | Discharge status: Home / Self Care | Payer: BC Managed Care – PPO | Attending: Emergency Medicine | Admitting: Emergency Medicine

## 2012-07-07 DIAGNOSIS — R209 Unspecified disturbances of skin sensation: Secondary | ICD-10-CM | POA: Insufficient documentation

## 2012-07-07 DIAGNOSIS — R109 Unspecified abdominal pain: Secondary | ICD-10-CM | POA: Insufficient documentation

## 2012-07-07 DIAGNOSIS — N730 Acute parametritis and pelvic cellulitis: Secondary | ICD-10-CM | POA: Insufficient documentation

## 2012-07-07 DIAGNOSIS — Z794 Long term (current) use of insulin: Secondary | ICD-10-CM | POA: Insufficient documentation

## 2012-07-07 DIAGNOSIS — IMO0001 Reserved for inherently not codable concepts without codable children: Secondary | ICD-10-CM | POA: Insufficient documentation

## 2012-07-07 DIAGNOSIS — R110 Nausea: Secondary | ICD-10-CM | POA: Insufficient documentation

## 2012-07-07 DIAGNOSIS — R339 Retention of urine, unspecified: Secondary | ICD-10-CM | POA: Insufficient documentation

## 2012-07-07 DIAGNOSIS — R300 Dysuria: Secondary | ICD-10-CM | POA: Insufficient documentation

## 2012-07-07 DIAGNOSIS — E1169 Type 2 diabetes mellitus with other specified complication: Secondary | ICD-10-CM | POA: Insufficient documentation

## 2012-07-07 DIAGNOSIS — G589 Mononeuropathy, unspecified: Secondary | ICD-10-CM | POA: Insufficient documentation

## 2012-07-07 DIAGNOSIS — R739 Hyperglycemia, unspecified: Secondary | ICD-10-CM

## 2012-07-07 DIAGNOSIS — N898 Other specified noninflammatory disorders of vagina: Secondary | ICD-10-CM | POA: Insufficient documentation

## 2012-07-07 DIAGNOSIS — R55 Syncope and collapse: Secondary | ICD-10-CM | POA: Insufficient documentation

## 2012-07-07 DIAGNOSIS — N949 Unspecified condition associated with female genital organs and menstrual cycle: Secondary | ICD-10-CM | POA: Insufficient documentation

## 2012-07-07 DIAGNOSIS — R319 Hematuria, unspecified: Secondary | ICD-10-CM | POA: Insufficient documentation

## 2012-07-07 LAB — CBC
HCT: 39.3 % (ref 36.0–46.0)
Hemoglobin: 14.3 g/dL (ref 12.0–15.0)
MCV: 84 fL (ref 78.0–100.0)
RBC: 4.68 MIL/uL (ref 3.87–5.11)
RDW: 12.2 % (ref 11.5–15.5)
WBC: 7.4 10*3/uL (ref 4.0–10.5)

## 2012-07-07 LAB — COMPREHENSIVE METABOLIC PANEL
BUN: 10 mg/dL (ref 6–23)
CO2: 26 mEq/L (ref 19–32)
Chloride: 92 mEq/L — ABNORMAL LOW (ref 96–112)
Creatinine, Ser: 0.53 mg/dL (ref 0.50–1.10)
GFR calc Af Amer: >90 mL/min (ref >90)
GFR calc non Af Amer: >90 mL/min (ref >90)
Glucose, Bld: 398 mg/dL — ABNORMAL HIGH (ref 70–99)
Total Bilirubin: 0.3 mg/dL (ref 0.3–1.2)

## 2012-07-07 LAB — PREGNANCY, URINE: Preg Test, Ur: NEGATIVE

## 2012-07-07 LAB — URINE MICROSCOPIC-ADD ON

## 2012-07-07 LAB — URINALYSIS, ROUTINE W REFLEX MICROSCOPIC
Ketones, ur: >80 mg/dL — AB
Leukocytes, UA: NEGATIVE
Nitrite: NEGATIVE
Protein, ur: NEGATIVE mg/dL

## 2012-07-07 MED ORDER — SODIUM CHLORIDE 0.9 % IV BOLUS (SEPSIS)
1000.0000 mL | Freq: Once | INTRAVENOUS | Status: AC
  Administered 2012-07-07: 1000 mL via INTRAVENOUS

## 2012-07-07 MED ORDER — DEXTROSE 5 % IV SOLN
250.0000 mg | Freq: Once | INTRAVENOUS | Status: AC
  Administered 2012-07-07: 250 mg via INTRAVENOUS
  Filled 2012-07-07: qty 250

## 2012-07-07 MED ORDER — ONDANSETRON HCL 4 MG PO TABS: 4.0000 mg | ORAL_TABLET | Freq: Four times a day (QID) | ORAL | Status: DC

## 2012-07-07 MED ORDER — AZITHROMYCIN 250 MG PO TABS
1000.0000 mg | ORAL_TABLET | Freq: Once | ORAL | Status: AC
  Administered 2012-07-07: 1000 mg via ORAL
  Filled 2012-07-07: qty 4

## 2012-07-07 MED ORDER — INSULIN REGULAR HUMAN 100 UNIT/ML IJ SOLN: 10.0000 [IU] | Freq: Once | INTRAMUSCULAR | Status: DC

## 2012-07-07 MED ORDER — INSULIN ASPART 100 UNIT/ML ~~LOC~~ SOLN
SUBCUTANEOUS | Status: AC
  Administered 2012-07-07: 10 [IU] via SUBCUTANEOUS
  Filled 2012-07-07: qty 1

## 2012-07-07 MED ORDER — HYDROCODONE-ACETAMINOPHEN 5-325 MG PO TABS: 1.0000 | ORAL_TABLET | ORAL | Status: DC | PRN

## 2012-07-07 MED ORDER — HYDROCODONE-ACETAMINOPHEN 5-325 MG PO TABS
1.0000 | ORAL_TABLET | Freq: Once | ORAL | Status: AC
  Administered 2012-07-07: 1 via ORAL
  Filled 2012-07-07: qty 1

## 2012-07-07 MED ORDER — INSULIN ASPART 100 UNIT/ML ~~LOC~~ SOLN
10.0000 [IU] | Freq: Once | SUBCUTANEOUS | Status: AC
  Administered 2012-07-07: 10 [IU] via SUBCUTANEOUS

## 2012-07-07 NOTE — ED Notes (Signed)
MD at bedside. 

## 2012-07-07 NOTE — ED Notes (Signed)
CBG 331. 

## 2012-07-07 NOTE — ED Notes (Signed)
Pt says her sugars have been in the 500's over the past 3 days. Reports today she last remembers getting out of the shower and her husband says she passed out. Also reports having pain in right flank area over the past month and pain with urination that has worsened

## 2012-07-07 NOTE — ED Provider Notes (Addendum)
History     CSN: [id number]  Arrival date & time 07/07/12  4098   First MD Initiated Contact with Patient 07/07/12 2100      Chief Complaint  Patient presents with  . Hyperglycemia  . Urinary Retention    states hard for her urinate    (Consider location/radiation/quality/duration/timing/severity/associated sxs/prior treatment) HPI Pt has had elevated blood sugar for several days with R flank and lower pelvic pain. +dysuria. + increased vag d/c. No fever or chills. Questionable syncopal episode today. No recent changes to insulin regimen. Pt states she has been complaint with meds.  Past Medical History  Diagnosis Date  . Diabetes mellitus   . Neuropathy     Past Surgical History  Procedure Date  . Cesarean section   . Cesarean section     Family History  Problem Relation Age of Onset  . Hypertension Mother     History  Substance Use Topics  . Smoking status: Never Smoker   . Smokeless tobacco: Never Used  . Alcohol Use: No    OB History    Grav Para Term Preterm Abortions TAB SAB Ect Mult Living                  Review of Systems  Constitutional: Positive for fatigue. Negative for fever and chills.  HENT: Negative for congestion, sore throat, neck pain and neck stiffness.   Respiratory: Negative for cough and shortness of breath.   Cardiovascular: Negative for chest pain.  Gastrointestinal: Positive for nausea and abdominal pain. Negative for vomiting, diarrhea, constipation and abdominal distention.  Genitourinary: Positive for dysuria, hematuria, flank pain, vaginal discharge and pelvic pain. Negative for vaginal bleeding.  Musculoskeletal: Positive for myalgias.  Skin: Negative for rash and wound.  Neurological: Positive for syncope and numbness. Negative for dizziness, seizures, weakness and headaches.    Allergies  Review of patient's allergies indicates no known allergies.  Home Medications   Current Outpatient Rx  Name  Route  Sig  Dispense   Refill  . AMITRIPTYLINE HCL 25 MG PO TABS   Oral   Take 25 mg by mouth at bedtime.         . IBUPROFEN 200 MG PO TABS   Oral   Take 400 mg by mouth every 6 (six) hours as needed. PAIN         . INSULIN ASPART 100 UNIT/ML [place] SOLN   Subcutaneous   Inject 20 Units into the skin 2 (two) times daily. Use accordingly         . INSULIN GLARGINE 100 UNIT/ML [place] SOLN   Subcutaneous   Inject 30 Units into the skin at bedtime. Insulin per regular dose         . METFORMIN HCL 500 MG PO TABS   Oral   Take 500 mg by mouth 2 (two) times daily with a meal.          . PREGABALIN 75 MG PO CAPS   Oral   Take 75 mg by mouth 2 (two) times daily.         Marland Kitchen HYDROCODONE-ACETAMINOPHEN 5-325 MG PO TABS   Oral   Take 1 tablet by mouth every 4 (four) hours as needed for pain.   15 tablet   0   . ONDANSETRON HCL 4 MG PO TABS   Oral   Take 1 tablet (4 mg total) by mouth every 6 (six) hours.   12 tablet   0     BP  115/83  Pulse 88  Temp 99 F (37.2 C) (Oral)  Resp 15  SpO2 100%  LMP 06/22/2012  Physical Exam  Nursing note and vitals reviewed. Constitutional: She is oriented to person, place, and time. She appears well-developed and well-nourished. No distress.  HENT:  Head: Normocephalic and atraumatic.  Mouth/Throat: Oropharynx is clear and moist. No oropharyngeal exudate.  Eyes: EOM are normal. Pupils are equal, round, and reactive to light.  Neck: Normal range of motion. Neck supple.       No posterior cervical midline TTP  Cardiovascular: Normal rate and regular rhythm.  Exam reveals no gallop and no friction rub.   No murmur heard. Pulmonary/Chest: Effort normal and breath sounds normal. No respiratory distress. She has no wheezes. She has no rales. She exhibits no tenderness.  Abdominal: Soft. Bowel sounds are normal. There is tenderness (mild TTP in epigastrum and suprapubic area. No rebound guarding). There is no rebound.  Genitourinary: Vaginal discharge (purulent d/c  from cervix) found.       +CMT, adnexal TTP  Musculoskeletal: Normal range of motion. She exhibits no edema and no tenderness.       No calf swelling or tenderness  Neurological: She is alert and oriented to person, place, and time.       5/5 motor in all ext. Decreased sensation in bl feet.   Skin: Skin is warm and dry. No rash noted. No erythema.  Psychiatric: She has a normal mood and affect. Her behavior is normal.    ED Course  Procedures (including critical care time)  Labs Reviewed  GLUCOSE, CAPILLARY - Abnormal; Notable for the following:    Glucose-Capillary 331 (*)     All other components within normal limits  CBC - Abnormal; Notable for the following:    MCHC 36.4 (*)     All other components within normal limits  COMPREHENSIVE METABOLIC PANEL - Abnormal; Notable for the following:    Sodium 132 (*)     Chloride 92 (*)     Glucose, Bld 398 (*)     All other components within normal limits  URINALYSIS, ROUTINE W REFLEX MICROSCOPIC - Abnormal; Notable for the following:    Specific Gravity, Urine 1.041 (*)     Glucose, UA >1000 (*)     Ketones, ur >80 (*)     All other components within normal limits  WET PREP, GENITAL - Abnormal; Notable for the following:    WBC, Wet Prep HPF POC MODERATE (*)     All other components within normal limits  GLUCOSE, CAPILLARY - Abnormal; Notable for the following:    Glucose-Capillary 254 (*)     All other components within normal limits  PREGNANCY, URINE  URINE MICROSCOPIC-ADD ON  GC/CHLAMYDIA PROBE AMP   No results found.   1. PID (acute pelvic inflammatory disease)   2. Hyperglycemia      Date: 07/07/2012  Rate:72  Rhythm: normal sinus rhythm  QRS Axis: normal  Intervals: normal  ST/T Wave abnormalities: normal  Conduction Disutrbances:none  Narrative Interpretation:   Old EKG Reviewed: none available    MDM   Pt appears more comfortable. VS normal. Treat for PID and with have f/u with PMD. Return for  worsening pain or concerns       Loren Racer, MD 07/07/12 2239  Loren Racer, MD 07/07/12 2246

## 2012-07-07 NOTE — ED Notes (Signed)
Pt states she doesn't feel well and she is nauseated,  History of high blood sugar.

## 2012-07-08 LAB — GC/CHLAMYDIA PROBE AMP: GC Probe RNA: NEGATIVE

## 2012-07-19 DIAGNOSIS — G629 Polyneuropathy, unspecified: Secondary | ICD-10-CM

## 2012-07-19 HISTORY — DX: Polyneuropathy, unspecified: G62.9

## 2012-09-09 ENCOUNTER — Other Ambulatory Visit: Payer: Self-pay | Admitting: Pain Medicine

## 2012-09-09 DIAGNOSIS — M545 Low back pain: Secondary | ICD-10-CM

## 2012-09-11 ENCOUNTER — Ambulatory Visit
Admission: RE | Admit: 2012-09-11 | Discharge: 2012-09-11 | Disposition: A | Discharge status: Home / Self Care | Payer: BC Managed Care – PPO | Source: Ambulatory Visit | Attending: Pain Medicine | Admitting: Pain Medicine

## 2012-09-11 DIAGNOSIS — M545 Low back pain: Secondary | ICD-10-CM

## 2012-09-16 ENCOUNTER — Emergency Department (HOSPITAL_COMMUNITY)
Admission: EM | Admit: 2012-09-16 | Discharge: 2012-09-16 | Disposition: A | Discharge status: Home / Self Care | Payer: BC Managed Care – PPO | Attending: Emergency Medicine | Admitting: Emergency Medicine

## 2012-09-16 ENCOUNTER — Encounter (HOSPITAL_COMMUNITY): Payer: Self-pay | Admitting: Emergency Medicine

## 2012-09-16 DIAGNOSIS — B9689 Other specified bacterial agents as the cause of diseases classified elsewhere: Secondary | ICD-10-CM | POA: Insufficient documentation

## 2012-09-16 DIAGNOSIS — E1165 Type 2 diabetes mellitus with hyperglycemia: Secondary | ICD-10-CM

## 2012-09-16 DIAGNOSIS — R110 Nausea: Secondary | ICD-10-CM | POA: Insufficient documentation

## 2012-09-16 DIAGNOSIS — E1065 Type 1 diabetes mellitus with hyperglycemia: Secondary | ICD-10-CM | POA: Insufficient documentation

## 2012-09-16 DIAGNOSIS — E1049 Type 1 diabetes mellitus with other diabetic neurological complication: Secondary | ICD-10-CM | POA: Insufficient documentation

## 2012-09-16 DIAGNOSIS — N760 Acute vaginitis: Secondary | ICD-10-CM | POA: Insufficient documentation

## 2012-09-16 DIAGNOSIS — A499 Bacterial infection, unspecified: Secondary | ICD-10-CM | POA: Insufficient documentation

## 2012-09-16 DIAGNOSIS — Z794 Long term (current) use of insulin: Secondary | ICD-10-CM | POA: Insufficient documentation

## 2012-09-16 DIAGNOSIS — Z3202 Encounter for pregnancy test, result negative: Secondary | ICD-10-CM | POA: Insufficient documentation

## 2012-09-16 DIAGNOSIS — R109 Unspecified abdominal pain: Secondary | ICD-10-CM | POA: Insufficient documentation

## 2012-09-16 DIAGNOSIS — N898 Other specified noninflammatory disorders of vagina: Secondary | ICD-10-CM | POA: Insufficient documentation

## 2012-09-16 DIAGNOSIS — Z79899 Other long term (current) drug therapy: Secondary | ICD-10-CM | POA: Insufficient documentation

## 2012-09-16 LAB — CBC
HCT: 35.3 % — ABNORMAL LOW (ref 36.0–46.0)
Hemoglobin: 12.6 g/dL (ref 12.0–15.0)
MCH: 30.1 pg (ref 26.0–34.0)
MCHC: 35.7 g/dL (ref 30.0–36.0)
MCV: 84.4 fL (ref 78.0–100.0)
Platelets: 328 10*3/uL (ref 150–400)
RBC: 4.18 MIL/uL (ref 3.87–5.11)
RDW: 11.7 % (ref 11.5–15.5)
WBC: 5.6 10*3/uL (ref 4.0–10.5)

## 2012-09-16 LAB — BASIC METABOLIC PANEL
BUN: 10 mg/dL (ref 6–23)
CO2: 22 mEq/L (ref 19–32)
Calcium: 8.7 mg/dL (ref 8.4–10.5)
Chloride: 95 mEq/L — ABNORMAL LOW (ref 96–112)
Creatinine, Ser: 0.62 mg/dL (ref 0.50–1.10)
GFR calc Af Amer: >90 mL/min (ref >90)
GFR calc non Af Amer: >90 mL/min (ref >90)
Glucose, Bld: 505 mg/dL — ABNORMAL HIGH (ref 70–99)
Potassium: 4 mEq/L (ref 3.5–5.1)
Sodium: 128 mEq/L — ABNORMAL LOW (ref 135–145)

## 2012-09-16 LAB — GLUCOSE, CAPILLARY
Glucose-Capillary: 554 mg/dL (ref 70–99)
Glucose-Capillary: 194 mg/dL — ABNORMAL HIGH (ref 70–99)

## 2012-09-16 LAB — URINALYSIS, ROUTINE W REFLEX MICROSCOPIC
Bilirubin Urine: NEGATIVE
Glucose, UA: >1000 mg/dL — AB
Hgb urine dipstick: NEGATIVE
Leukocytes, UA: NEGATIVE
Nitrite: NEGATIVE
Protein, ur: NEGATIVE mg/dL
Specific Gravity, Urine: 1.033 — ABNORMAL HIGH (ref 1.005–1.030)
Urobilinogen, UA: 0.2 mg/dL (ref 0.0–1.0)
pH: 5.5 (ref 5.0–8.0)

## 2012-09-16 LAB — URINE MICROSCOPIC-ADD ON

## 2012-09-16 LAB — WET PREP, GENITAL
Trich, Wet Prep: NONE SEEN
Yeast Wet Prep HPF POC: NONE SEEN

## 2012-09-16 LAB — MAGNESIUM: Magnesium: 1.7 mg/dL (ref 1.5–2.5)

## 2012-09-16 LAB — PREGNANCY, URINE: Preg Test, Ur: NEGATIVE

## 2012-09-16 MED ORDER — MORPHINE SULFATE 4 MG/ML IJ SOLN
6.0000 mg | Freq: Once | INTRAMUSCULAR | Status: AC
  Administered 2012-09-16: 6 mg via INTRAVENOUS
  Filled 2012-09-16: qty 2

## 2012-09-16 MED ORDER — METRONIDAZOLE 500 MG PO TABS: 500.0000 mg | ORAL_TABLET | Freq: Two times a day (BID) | ORAL | Status: DC

## 2012-09-16 MED ORDER — INSULIN ASPART 100 UNIT/ML ~~LOC~~ SOLN
15.0000 [IU] | Freq: Once | SUBCUTANEOUS | Status: AC
  Administered 2012-09-16: 15 [IU] via INTRAVENOUS
  Filled 2012-09-16: qty 1

## 2012-09-16 MED ORDER — INSULIN REGULAR HUMAN 100 UNIT/ML IJ SOLN: 15.0000 [IU] | Freq: Once | INTRAMUSCULAR | Status: DC

## 2012-09-16 MED ORDER — ONDANSETRON HCL 4 MG/2ML IJ SOLN
4.0000 mg | Freq: Once | INTRAMUSCULAR | Status: AC
Start: 2012-09-16 — End: 2012-09-16
  Administered 2012-09-16: 4 mg via INTRAVENOUS
  Filled 2012-09-16: qty 2

## 2012-09-16 MED ORDER — MORPHINE SULFATE 4 MG/ML IJ SOLN
4.0000 mg | Freq: Once | INTRAMUSCULAR | Status: AC
  Administered 2012-09-16: 4 mg via INTRAVENOUS
  Filled 2012-09-16: qty 1

## 2012-09-16 MED ORDER — SODIUM CHLORIDE 0.9 % IV BOLUS (SEPSIS)
2000.0000 mL | Freq: Once | INTRAVENOUS | Status: AC
  Administered 2012-09-16: 2000 mL via INTRAVENOUS

## 2012-09-16 NOTE — ED Notes (Signed)
YNW:[id number]<[id number]> Expected date:09/16/12<BR> Expected time: 3:26 PM<BR> Means of arrival:Ambulance<BR> Comments:<BR> 27yo- f- DM, cbg 421

## 2012-09-16 NOTE — ED Notes (Signed)
MD at bedside. 

## 2012-09-16 NOTE — ED Notes (Signed)
PER EMS- pt picked up from home with c/o hyperglycemia x1 day.  Reports that CBG is "reading high".  Pt has hx of  Type 1 DM.  Pt reports that she has been compliant with medication regimen.  Pt was hospitalized a month ago for same issue.  Pt has been off insulin pump x2 months.  Alert and oriented.  20g LAC, NS given en route by EMS.

## 2012-09-16 NOTE — ED Provider Notes (Signed)
History    27yF with hyperglycemia. Blood sugars have been "high" since yesterday. Reports compliance with her meds. Mild diffuse abdominal cramping, but somewhat worse in lower abdomen. Having vaginal discharge. No urinary complaints. No unusual bleeding. No fever or chills. Mild nausea, but no vomiting. No sick contacts.  .  CSN: [id number]  Arrival date & time 09/16/12  1540   First MD Initiated Contact with Patient 09/16/12 1542      Chief Complaint  Patient presents with  . Hyperglycemia    (Consider location/radiation/quality/duration/timing/severity/associated sxs/prior treatment) HPI  Past Medical History  Diagnosis Date  . Diabetes mellitus   . Neuropathy     Past Surgical History  Procedure Date  . Cesarean section   . Cesarean section     Family History  Problem Relation Age of Onset  . Hypertension Mother     History  Substance Use Topics  . Smoking status: Never Smoker   . Smokeless tobacco: Never Used  . Alcohol Use: No    OB History    Grav Para Term Preterm Abortions TAB SAB Ect Mult Living                  Review of Systems  All systems reviewed and negative, other than as noted in HPI.   Allergies  Review of patient's allergies indicates no known allergies.  Home Medications   Current Outpatient Rx  Name  Route  Sig  Dispense  Refill  . AMITRIPTYLINE HCL 25 MG PO TABS   Oral   Take 25 mg by mouth at bedtime.         Marland Kitchen GABAPENTIN 300 MG PO CAPS   Oral   Take 300 mg by mouth 3 (three) times daily.         Marland Kitchen HYDROCODONE-ACETAMINOPHEN 5-325 MG PO TABS   Oral   Take 1 tablet by mouth every 4 (four) hours as needed for pain.   15 tablet   0   . INSULIN ASPART 100 UNIT/ML [place] SOLN   Subcutaneous   Inject 30 Units into the skin 3 (three) times daily before meals. Use accordingly         . INSULIN GLARGINE 100 UNIT/ML [place] SOLN   Subcutaneous   Inject 30 Units into the skin at bedtime. Insulin per regular dose         .  METFORMIN HCL 500 MG PO TABS   Oral   Take 500 mg by mouth 2 (two) times daily with a meal.          . PRAMIPEXOLE DIHYDROCHLORIDE 0.5 MG PO TABS   Oral   Take 0.5-1 mg by mouth at bedtime.         Marland Kitchen PREGABALIN 75 MG PO CAPS   Oral   Take 150 mg by mouth 2 (two) times daily.          Marland Kitchen PRESCRIPTION MEDICATION   Topical   Apply 1-2 g topically QID. Topical compound.  Apply 3 to 4 times daily to legs and rub in thoroughly.  Contains:  Ketamine 10%, Gabapentin 6%, Diclofenac 3%, Baclofen 2%, Cyclobenzaprine 2%, Bupivicaine 1%, Nifedipine 2%, Imipramine 3%, and Clonidine 0.2%.           BP 115/72  Pulse 88  Temp 97.5 F (36.4 C) (Oral)  Resp 18  SpO2 100%  LMP 09/09/2012  Physical Exam  Nursing note and vitals reviewed. Constitutional: She appears well-developed and well-nourished. No distress.  Laying in bed. NAD.   HENT:  Head: Normocephalic and atraumatic.  Eyes: Conjunctivae normal are normal. Right eye exhibits no discharge. Left eye exhibits no discharge.  Neck: Neck supple.  Cardiovascular: Normal rate, regular rhythm and normal heart sounds.  Exam reveals no gallop and no friction rub.   No murmur heard. Pulmonary/Chest: Effort normal and breath sounds normal. No respiratory distress.  Abdominal: Soft. She exhibits no distension. There is tenderness.       Mild suprapubic and LLQ tenderness w/o rebound or guarding.  Genitourinary:       Normal external female genitalia. Minimal whitish vaginal discharge. No cervical lesions noted. No CMT, adnexal tenderness or mass appreciated  Musculoskeletal: She exhibits no edema and no tenderness.  Neurological: She is alert.  Skin: Skin is warm and dry.  Psychiatric: She has a normal mood and affect. Her behavior is normal. Thought content normal.    ED Course  Procedures (including critical care time)  Labs Reviewed  GLUCOSE, CAPILLARY - Abnormal; Notable for the following:    Glucose-Capillary 554 (*)      All other components within normal limits  BASIC METABOLIC PANEL - Abnormal; Notable for the following:    Sodium 128 (*)     Chloride 95 (*)     Glucose, Bld 505 (*)     All other components within normal limits  CBC - Abnormal; Notable for the following:    HCT 35.3 (*)     All other components within normal limits  URINALYSIS, ROUTINE W REFLEX MICROSCOPIC - Abnormal; Notable for the following:    Specific Gravity, Urine 1.033 (*)     Glucose, UA >1000 (*)     Ketones, ur TRACE (*)     All other components within normal limits  WET PREP, GENITAL - Abnormal; Notable for the following:    Clue Cells Wet Prep HPF POC FEW (*)     WBC, Wet Prep HPF POC RARE (*)     All other components within normal limits  GLUCOSE, CAPILLARY - Abnormal; Notable for the following:    Glucose-Capillary 194 (*)     All other components within normal limits  URINE MICROSCOPIC-ADD ON - Abnormal; Notable for the following:    Squamous Epithelial / LPF FEW (*)     Bacteria, UA FEW (*)     All other components within normal limits  MAGNESIUM  PREGNANCY, URINE  GC/CHLAMYDIA PROBE AMP   No results found.   1. Poorly controlled diabetes mellitus   2. Bacterial vaginosis    MDM  27yf with hyperglycemia. Trace ketones on UA, but bicarb normal. Anion gap 11. Received IVF and insulin with improvement of symptoms and glucose. Pt reports compliance with her medications. Is followed by endocrinology. I feel she is appropriate for discharge at this time with close outpt FU.         Raeford Razor, MD 09/22/12 [phone]

## 2012-09-17 LAB — GC/CHLAMYDIA PROBE AMP
CT Probe RNA: NEGATIVE
GC Probe RNA: NEGATIVE

## 2012-10-03 ENCOUNTER — Other Ambulatory Visit: Payer: Self-pay

## 2012-11-09 ENCOUNTER — Ambulatory Visit (INDEPENDENT_AMBULATORY_CARE_PROVIDER_SITE_OTHER): Payer: BC Managed Care – PPO | Admitting: Internal Medicine

## 2012-11-09 ENCOUNTER — Encounter: Payer: Self-pay | Admitting: Internal Medicine

## 2012-11-09 VITALS — BP 118/60 | HR 93 | Temp 98.1°F | Resp 10 | Ht 66.0 in | Wt 156.0 lb

## 2012-11-09 DIAGNOSIS — E1149 Type 2 diabetes mellitus with other diabetic neurological complication: Secondary | ICD-10-CM

## 2012-11-09 DIAGNOSIS — E1010 Type 1 diabetes mellitus with ketoacidosis without coma: Secondary | ICD-10-CM

## 2012-11-09 DIAGNOSIS — G909 Disorder of the autonomic nervous system, unspecified: Secondary | ICD-10-CM

## 2012-11-09 DIAGNOSIS — E1042 Type 1 diabetes mellitus with diabetic polyneuropathy: Secondary | ICD-10-CM | POA: Insufficient documentation

## 2012-11-09 DIAGNOSIS — E1143 Type 2 diabetes mellitus with diabetic autonomic (poly)neuropathy: Secondary | ICD-10-CM

## 2012-11-09 LAB — BASIC METABOLIC PANEL
CO2: 28 mEq/L (ref 19–32)
Calcium: 9.7 mg/dL (ref 8.4–10.5)
Glucose, Bld: 472 mg/dL — ABNORMAL HIGH (ref 70–99)
Sodium: 133 mEq/L — ABNORMAL LOW (ref 135–145)

## 2012-11-09 LAB — MICROALBUMIN / CREATININE URINE RATIO: Microalb Creat Ratio: 0.3 mg/g (ref 0.0–30.0)

## 2012-11-09 MED ORDER — ACETONE (URINE) TEST VI STRP: 1.0000 | ORAL_STRIP | Status: DC | PRN

## 2012-11-09 MED ORDER — INSULIN ASPART 100 UNIT/ML ~~LOC~~ SOLN: 15.0000 [IU] | Freq: Three times a day (TID) | SUBCUTANEOUS | Status: DC

## 2012-11-09 MED ORDER — INSULIN GLARGINE 100 UNIT/ML ~~LOC~~ SOLN: 40.0000 [IU] | Freq: Every day | SUBCUTANEOUS | Status: DC

## 2012-11-09 NOTE — Progress Notes (Signed)
Subjective:     Patient ID: [name], female   DOB: 1985/10/17, 27 y.o.   MRN: [id number]  HPI Ms. Garner is a pleasant 27 year old woman, self-referred for management of DM 1, uncontrolled, with complications (peripheral neuropathy), and multiple DKA admissions in the past.  She was dx in Nov. 2009, was in DKA then. She then saw Dr. Talmage Nap and was on an insulin pump [facility]), but switched insurances and had to come off the pump. She is trying to get disability. Her DM was never under control, with lowest sugar 200. However, she loved the pump, would like to have this again. Her sugars were much better when she was on this.  She now takes: - Lantus 30 units at night - Novolog 10 units tid - was on Metformin (!), now off this she ran out of the prescription  Her sugars are usually in the 300s, sometimes HI. She checks her sugars 6x at a time.  - am: highest, >300 - lunch: 200-300 - nighttime: >300 Low: 200, Highest HI. She had a low in the 70s before, in the hospital. Has hypoglycemia awareness.   Diet: - Breakfast: Eggs, grits or cereal - lunch: Salad or sandwich - Dinner: Chicken and veggies - Snack: Once a day - chips  She has neuropathy from DM (on Percocet, Lyrica). This is unbearable and she cannot work for 2 years. Last eye exam was last year: no DR. No CKD, last BUN/Cr 10/0.62 (09/16/2012). She was not told she had proteins in urine. Last HbA1c was 15% in 06/2012 (Dr. Talmage Nap).   She has an extensive FH on DM: MGM and MGGM, aunt.   Past Medical History  Diagnosis Date  . Diabetes mellitus   . Neuropathy 07/2012  . Chronic painful diabetic neuropathy    Past Surgical History  Procedure Laterality Date  . Cesarean section    . Cesarean section     History   Social History  . Marital Status: Married    Spouse Name: N/A    Number of Children: 2 (8 and 90 y/o)  . Years of Education: N/A   Occupational History  . Not on file.   Social History  Main Topics  . Smoking status: Never Smoker   . Smokeless tobacco: Never Used  . Alcohol Use: No  . Drug Use: No  . Sexually Active: Yes    Birth Control/ Protection: None   Other Topics Concern  . Not on file   Social History Narrative  . No narrative on file   Current Outpatient Prescriptions on File Prior to Visit  Medication Sig Dispense Refill  . amitriptyline (ELAVIL) 25 MG tablet Take 25 mg by mouth at bedtime.      . insulin aspart (NOVOLOG) 100 UNIT/ML injection Inject 30 Units into the skin 3 (three) times daily before meals. Use accordingly      . insulin glargine (LANTUS) 100 UNIT/ML injection Inject 30 Units into the skin at bedtime. Insulin per regular dose      . pregabalin (LYRICA) 75 MG capsule Take 150 mg by mouth 2 (two) times daily.       Marland Kitchen PRESCRIPTION MEDICATION Apply 1-2 g topically QID. Topical compound.  Apply 3 to 4 times daily to legs and rub in thoroughly.  Contains:  Ketamine 10%, Gabapentin 6%, Diclofenac 3%, Baclofen 2%, Cyclobenzaprine 2%, Bupivicaine 1%, Nifedipine 2%, Imipramine 3%, and Clonidine 0.2%.      Marland Kitchen HYDROcodone-acetaminophen (NORCO/VICODIN) 5-325 MG per tablet Take 1 tablet  by mouth every 4 (four) hours as needed for pain.  15 tablet  0  . metFORMIN (GLUCOPHAGE) 500 MG tablet Take 500 mg by mouth 2 (two) times daily with a meal.       . metroNIDAZOLE (FLAGYL) 500 MG tablet Take 1 tablet (500 mg total) by mouth 2 (two) times daily.  10 tablet  0   No current facility-administered medications on file prior to visit.   Allergies  Allergen Reactions  . Pramipexole Shortness Of Breath   Family History  Problem Relation Age of Onset  . Hypertension Mother    -Review of Systems Constitutional: + weight loss, decreased appetite, + fatigue, hot flashes, poor sleep, feeling excessively hot, excessive urination, nocturia more than 1, burning with urination Eyes: + blurry vision, no xerophthalmia ENT: no sore throat, no nodules palpated in throat,  no dysphagia/odynophagia, no hoarseness Cardiovascular: no CP/+ SOB/palpitations/+ leg swelling Respiratory: no cough/SOB Gastrointestinal: + N/V/D/no C Musculoskeletal: + muscle/+ joint aches Skin: no rashes, easy bruising, itching, hair loss Neurological: no tremors/numbness/tingling/dizziness, headache Psychiatric: no depression/anxiety  Objective:   Physical Exam BP 118/60  Pulse 93  Temp(Src) 98.1 F (36.7 C) (Oral)  Resp 10  Ht 5\' 6"  (1.676 m)  Wt 156 lb (70.761 kg)  BMI 25.19 kg/m2  SpO2 98%  LMP 11/06/2012 Constitutional: normal weight, in NAD Eyes: PERRLA, EOMI, no exophthalmos ENT: moist mucous membranes, no thyromegaly, no cervical lymphadenopathy Cardiovascular: RRR, No MRG Respiratory: CTA B Gastrointestinal: abdomen soft, NT, ND, BS+ Musculoskeletal: no deformities, strength intact in all 4 Skin: moist, warm, no rashes Neurological: no tremor with outstretched hands, DTR normal in all 4 Foot exam: good pedal pulses, slightly decreased sensation to light touch on the soles of both feet, skin intact, nails intact, no deformities Assessment:     DM1, uncontrolled, with complications, with multiple DKA admissions in the past - peripheral neuropathy     Plan:     Ms Carmin Muskrat has poorly controlled diabetes, and her most recent hemoglobin A1c was 15%, with multiple admissions for DKA in the past, and also with significant peripheral neuropathy. She was previously seen by Dr. Talmage Nap and, but now would like to follow with me. She has been on an insulin pump before, until approximately 6 months ago. She is interested to return to using the pump, especially since her new insurance allows it. - I gave her the [employer] contact information, and we also discussed about the new Medtronic pump, 530G, with Enlite CGM. She is interested in the pump, and I will schedule her an appointment with diabetes education for pump teaching and making sure that she knows how to carb count before  we start her on it. - In the meantime, I advised her to increase her Lantus and NovoLog as follows: Please increase the Lantus to 40 units at night. Please increase the Novolog to 15 units 3x a day before meals. Please add the following SSI: - 151-175: +1 units - 176-200: +2 units - 201-225: +3 units - 226-250: +4 units - 251-275: +5 units - 276-300: +6 units - 301-and above: +7 units - would not start her back on metformin, since she most likely is a type I diabetic (I would have liked to check a C-peptide, but most likely her sugars higher than 200, which would render the C-peptide value unusable) - I would check a hemoglobin A1c, a BMP and a microalbumin/creatinine ratio and will send her the results through my chart - given sugar log and  advised how to fill it and to bring it at next appt - given foot care handout and explained the principles - given instructions for hypoglycemia management "15-15 rule" - given her healthy eating diabetic brochure - I will see her back in approximately a month  Office Visit on 11/09/2012  Component Date Value Range Status  . Hemoglobin A1C 11/09/2012 12.8* 4.6 - 6.5 % Final   Glycemic Control Guidelines for People with Diabetes:Non Diabetic:  <6%Goal of Therapy: <7%Additional Action Suggested:  >8%   . Sodium 11/09/2012 133* 135 - 145 mEq/L Final  . Potassium 11/09/2012 4.3  3.5 - 5.1 mEq/L Final  . Chloride 11/09/2012 96  96 - 112 mEq/L Final  . CO2 11/09/2012 28  19 - 32 mEq/L Final  . Glucose, Bld 11/09/2012 472* 70 - 99 mg/dL Final  . BUN 16/05/9603 10  6 - 23 mg/dL Final  . Creatinine, Ser 11/09/2012 0.8  0.4 - 1.2 mg/dL Final  . Calcium 54/04/8118 9.7  8.4 - 10.5 mg/dL Final  . GFR 14/78/2956 113.00  >60.00 mL/min Final  . Microalb, Ur 11/09/2012 0.1  0.0 - 1.9 mg/dL Final  . Creatinine,U 21/30/8657 29.3   Final  . Microalb Creat Ratio 11/09/2012 0.3  0.0 - 30.0 mg/g Final   Pt adviced to stay very well hydrated, and I will send ketone  strips to her pharmacy.

## 2012-11-09 NOTE — Patient Instructions (Addendum)
Please increase the Lantus to 40 units at night. Please increase the Novolog to 15 units 3x a day before meals. Please add the following SSI: - 151-175: +1 units - 176-200: +2 units - 201-225: +3 units - 226-250: +4 units - 251-275: +5 units - 276-300: +6 units - 301-and above: +7 units Please return in a month with your sugar log. I will send you the labs through MyChart.

## 2012-11-11 ENCOUNTER — Telehealth: Payer: Self-pay | Admitting: Internal Medicine

## 2012-11-11 NOTE — Telephone Encounter (Signed)
The patient is hoping to get samples of Novolog

## 2012-11-12 ENCOUNTER — Telehealth: Payer: Self-pay | Admitting: *Deleted

## 2012-11-12 NOTE — Telephone Encounter (Signed)
Pt called requesting samples of Novolog. Advised pt that there would be 2 samples for to pick up today.

## 2012-11-26 ENCOUNTER — Ambulatory Visit: Payer: BC Managed Care – PPO | Admitting: *Deleted

## 2012-11-30 ENCOUNTER — Encounter (HOSPITAL_COMMUNITY): Payer: Self-pay | Admitting: Emergency Medicine

## 2012-11-30 ENCOUNTER — Emergency Department (HOSPITAL_COMMUNITY)
Admission: EM | Admit: 2012-11-30 | Discharge: 2012-12-01 | Disposition: A | Discharge status: Home / Self Care | Payer: BC Managed Care – PPO | Attending: Emergency Medicine | Admitting: Emergency Medicine

## 2012-11-30 DIAGNOSIS — R112 Nausea with vomiting, unspecified: Secondary | ICD-10-CM | POA: Insufficient documentation

## 2012-11-30 DIAGNOSIS — E1142 Type 2 diabetes mellitus with diabetic polyneuropathy: Secondary | ICD-10-CM | POA: Insufficient documentation

## 2012-11-30 DIAGNOSIS — Z79899 Other long term (current) drug therapy: Secondary | ICD-10-CM | POA: Insufficient documentation

## 2012-11-30 DIAGNOSIS — E1069 Type 1 diabetes mellitus with other specified complication: Secondary | ICD-10-CM | POA: Insufficient documentation

## 2012-11-30 DIAGNOSIS — Z794 Long term (current) use of insulin: Secondary | ICD-10-CM | POA: Insufficient documentation

## 2012-11-30 DIAGNOSIS — Z8669 Personal history of other diseases of the nervous system and sense organs: Secondary | ICD-10-CM | POA: Insufficient documentation

## 2012-11-30 DIAGNOSIS — R42 Dizziness and giddiness: Secondary | ICD-10-CM | POA: Insufficient documentation

## 2012-11-30 DIAGNOSIS — R6883 Chills (without fever): Secondary | ICD-10-CM | POA: Insufficient documentation

## 2012-11-30 DIAGNOSIS — R739 Hyperglycemia, unspecified: Secondary | ICD-10-CM

## 2012-11-30 DIAGNOSIS — E1049 Type 1 diabetes mellitus with other diabetic neurological complication: Secondary | ICD-10-CM | POA: Insufficient documentation

## 2012-11-30 LAB — CBC WITH DIFFERENTIAL/PLATELET
Basophils Absolute: 0 K/uL (ref 0.0–0.1)
Basophils Relative: 0 % (ref 0–1)
Eosinophils Absolute: 0.2 10*3/uL (ref 0.0–0.7)
Eosinophils Relative: 2 % (ref 0–5)
HCT: 39.1 % (ref 36.0–46.0)
Hemoglobin: 13.9 g/dL (ref 12.0–15.0)
Lymphocytes Relative: 41 % (ref 12–46)
Lymphs Abs: 3 10*3/uL (ref 0.7–4.0)
MCH: 30.3 pg (ref 26.0–34.0)
MCHC: 35.5 g/dL (ref 30.0–36.0)
MCV: 85.4 fL (ref 78.0–100.0)
Monocytes Absolute: 0.5 K/uL (ref 0.1–1.0)
Monocytes Relative: 7 % (ref 3–12)
Neutro Abs: 3.6 K/uL (ref 1.7–7.7)
Neutrophils Relative %: 49 % (ref 43–77)
Platelets: 298 10*3/uL (ref 150–400)
RBC: 4.58 MIL/uL (ref 3.87–5.11)
RDW: 12.3 % (ref 11.5–15.5)
WBC: 7.3 10*3/uL (ref 4.0–10.5)

## 2012-11-30 LAB — COMPREHENSIVE METABOLIC PANEL
ALT: 9 U/L (ref 0–35)
AST: 10 U/L (ref 0–37)
Albumin: 3.6 g/dL (ref 3.5–5.2)
Alkaline Phosphatase: 75 U/L (ref 39–117)
Glucose, Bld: 374 mg/dL — ABNORMAL HIGH (ref 70–99)
Potassium: 3.8 mEq/L (ref 3.5–5.1)
Sodium: 134 mEq/L — ABNORMAL LOW (ref 135–145)
Total Protein: 7 g/dL (ref 6.0–8.3)

## 2012-11-30 LAB — COMPREHENSIVE METABOLIC PANEL WITH GFR
BUN: 11 mg/dL (ref 6–23)
CO2: 27 meq/L (ref 19–32)
Calcium: 9.5 mg/dL (ref 8.4–10.5)
Chloride: 95 meq/L — ABNORMAL LOW (ref 96–112)
Creatinine, Ser: 0.66 mg/dL (ref 0.50–1.10)
GFR calc Af Amer: >90 mL/min (ref >90)
GFR calc non Af Amer: >90 mL/min (ref >90)
Total Bilirubin: 0.2 mg/dL — ABNORMAL LOW (ref 0.3–1.2)

## 2012-11-30 LAB — GLUCOSE, CAPILLARY
Glucose-Capillary: 434 mg/dL — ABNORMAL HIGH (ref 70–99)
Glucose-Capillary: 321 mg/dL — ABNORMAL HIGH (ref 70–99)
Glucose-Capillary: 307 mg/dL — ABNORMAL HIGH (ref 70–99)

## 2012-11-30 MED ORDER — SODIUM CHLORIDE 0.9 % IV BOLUS (SEPSIS)
1000.0000 mL | Freq: Once | INTRAVENOUS | Status: AC
  Administered 2012-11-30: 1000 mL via INTRAVENOUS

## 2012-11-30 MED ORDER — SODIUM CHLORIDE 0.9 % IV SOLN: Freq: Once | INTRAVENOUS | Status: DC

## 2012-11-30 MED ORDER — INSULIN ASPART PROT & ASPART (70-30 MIX) 100 UNIT/ML ~~LOC~~ SUSP
15.0000 [IU] | Freq: Once | SUBCUTANEOUS | Status: AC
  Administered 2012-11-30: 15 [IU] via SUBCUTANEOUS
  Filled 2012-11-30: qty 10

## 2012-11-30 MED ORDER — OXYCODONE-ACETAMINOPHEN 5-325 MG PO TABS
1.0000 | ORAL_TABLET | Freq: Once | ORAL | Status: AC
  Administered 2012-11-30: 1 via ORAL
  Filled 2012-11-30: qty 1

## 2012-11-30 MED ORDER — INSULIN ASPART 100 UNIT/ML ~~LOC~~ SOLN
10.0000 [IU] | Freq: Once | SUBCUTANEOUS | Status: AC
  Administered 2012-11-30: 10 [IU] via SUBCUTANEOUS
  Filled 2012-11-30: qty 1

## 2012-11-30 MED ORDER — ONDANSETRON HCL 4 MG/2ML IJ SOLN
4.0000 mg | Freq: Once | INTRAMUSCULAR | Status: AC
  Administered 2012-11-30: 4 mg via INTRAVENOUS
  Filled 2012-11-30: qty 2

## 2012-11-30 MED ORDER — INSULIN ASPART PROT & ASPART (70-30 MIX) 100 UNIT/ML ~~LOC~~ SUSP: 10.0000 [IU] | Freq: Once | SUBCUTANEOUS | Status: DC

## 2012-11-30 NOTE — ED Notes (Signed)
Insulin not sent from pharmacy yet.  Pharmacy called.

## 2012-11-30 NOTE — ED Notes (Signed)
Patient c/o hyperglycemia, dizziness, and weakness.  Patient took 25 units of Insulin at 1230 today.

## 2012-11-30 NOTE — ED Notes (Signed)
CBG 321 

## 2012-11-30 NOTE — ED Provider Notes (Signed)
History     CSN: [id number]  Arrival date & time 11/30/12  1545   First MD Initiated Contact with Patient 11/30/12 1847      Chief Complaint  Patient presents with  . Hyperglycemia    (Consider location/radiation/quality/duration/timing/severity/associated sxs/prior treatment) HPI  Patient is a 27 year old female history significant for type 1 diabetes presenting to ED for hyperglycemia and dizziness. Patient states she woke up feeling unwell this morning with dizziness, nausea, vomiting nonbilious nonbloody, chills noted her sugars were elevated. States she's been unable to keep by mouth intake. Patient is currently on a sliding scale regimen including NovoLog and Lantus this was recently changed 2 months ago to 2 high sugars. Patient states her sugars typically run in the 300s even after recent regimen change. Denies any recent illnesses, fevers, sick contacts, dysuria or other urinary symptoms, abdominal pain, loss of consciousness.  Past Medical History  Diagnosis Date  . Diabetes mellitus   . Neuropathy 07/2012  . Chronic painful diabetic neuropathy     Past Surgical History  Procedure Laterality Date  . Cesarean section    . Cesarean section      Family History  Problem Relation Age of Onset  . Hypertension Mother     History  Substance Use Topics  . Smoking status: Never Smoker   . Smokeless tobacco: Never Used  . Alcohol Use: No    OB History   Grav Para Term Preterm Abortions TAB SAB Ect Mult Living                  Review of Systems  Constitutional: Positive for chills. Negative for fever.  HENT: Negative for sore throat, sneezing and trouble swallowing.   Respiratory: Negative for cough and shortness of breath.   Cardiovascular: Negative for chest pain.  Gastrointestinal: Positive for nausea and vomiting. Negative for abdominal pain.  Genitourinary: Negative for dysuria and hematuria.  Musculoskeletal: Negative for back pain.  Skin: Negative.    Neurological: Positive for dizziness and light-headedness.  Psychiatric/Behavioral: Negative.     Allergies  Pramipexole  Home Medications   Current Outpatient Rx  Name  Route  Sig  Dispense  Refill  . acetone, urine, test strip   Does not apply   1 strip by Does not apply route as needed.   25 each   2   . amitriptyline (ELAVIL) 25 MG tablet   Oral   Take 25 mg by mouth at bedtime.         . insulin aspart (NOVOLOG) 100 UNIT/ML injection   Subcutaneous   Inject 15 Units into the skin 3 (three) times daily before meals. Use accordingly   2 vial   1   . insulin glargine (LANTUS) 100 UNIT/ML injection   Subcutaneous   Inject 0.4 mLs (40 Units total) into the skin at bedtime. Insulin per regular dose   20 mL   1   . metFORMIN (GLUCOPHAGE) 500 MG tablet   Oral   Take 500 mg by mouth 2 (two) times daily with a meal.          . pregabalin (LYRICA) 75 MG capsule   Oral   Take 150 mg by mouth 2 (two) times daily.          Marland Kitchen PRESCRIPTION MEDICATION   Topical   Apply 1-2 g topically QID. Topical compound.  Apply 3 to 4 times daily to legs and rub in thoroughly.  Contains:  Ketamine 10%, Gabapentin 6%, Diclofenac 3%, Baclofen  2%, Cyclobenzaprine 2%, Bupivicaine 1%, Nifedipine 2%, Imipramine 3%, and Clonidine 0.2%.           BP 101/70  Pulse 84  Temp(Src) 98.5 F (36.9 C) (Oral)  Resp 16  SpO2 100%  LMP 11/06/2012  Physical Exam  Constitutional: She is oriented to person, place, and time. She appears well-developed and well-nourished. No distress.  HENT:  Head: Normocephalic and atraumatic.  Mouth/Throat: Oropharynx is clear and moist.  Eyes: Conjunctivae are normal.  Neck: Neck supple.  Cardiovascular: Normal rate, regular rhythm and normal heart sounds.   Pulmonary/Chest: Effort normal and breath sounds normal. No respiratory distress. She has no wheezes.  Abdominal: Soft. Bowel sounds are normal. There is no tenderness.  Lymphadenopathy:    She has  no cervical adenopathy.  Neurological: She is alert and oriented to person, place, and time.  Skin: Skin is warm and dry. She is not diaphoretic.    ED Course  Procedures (including critical care time)  Medications  0.9 %  sodium chloride infusion ( Intravenous Stopped 11/30/12 2146)  sodium chloride 0.9 % bolus 1,000 mL (0 mLs Intravenous Stopped 11/30/12 2018)  insulin aspart protamine-insulin aspart (NOVOLOG 70/30) injection 15 Units (15 Units Subcutaneous Given 11/30/12 2116)  oxyCODONE-acetaminophen (PERCOCET/ROXICET) 5-325 MG per tablet 1 tablet (1 tablet Oral Given 11/30/12 2018)  ondansetron (ZOFRAN) injection 4 mg (4 mg Intravenous Given 11/30/12 2215)  insulin aspart (novoLOG) injection 10 Units (10 Units Subcutaneous Given 11/30/12 2225)  oxyCODONE-acetaminophen (PERCOCET/ROXICET) 5-325 MG per tablet 1 tablet (1 tablet Oral Given 11/30/12 2354)  insulin aspart (novoLOG) injection 10 Units (10 Units Subcutaneous Given 12/01/12 0032)     Labs Reviewed  GLUCOSE, CAPILLARY - Abnormal; Notable for the following:    Glucose-Capillary 371 (*)    All other components within normal limits  COMPREHENSIVE METABOLIC PANEL - Abnormal; Notable for the following:    Sodium 134 (*)    Chloride 95 (*)    Glucose, Bld 374 (*)    Total Bilirubin 0.2 (*)    All other components within normal limits  GLUCOSE, CAPILLARY - Abnormal; Notable for the following:    Glucose-Capillary 434 (*)    All other components within normal limits  GLUCOSE, CAPILLARY - Abnormal; Notable for the following:    Glucose-Capillary 321 (*)    All other components within normal limits  CBC WITH DIFFERENTIAL   Corrected sodium for hyperglycemia is 141. Anion gap calculated at 12.  No results found.   1. Hyperglycemia       MDM  Patient is a 27 year old female past medical history significant for poorly controlled type 1 diabetes presenting for hyperglycemia and dizziness that began this morning. Patient has  been to ED several times for similar episodes of hyperglycemia with minor associated symptoms. On examination patient is nontoxic, non-ill appearing in no acute distress. Physical exam is benign. She was treated with IV fluids and insulin and pain medication for diabetic neuropathy. Patient was not in DKA. Blood sugars on that appropriately to IV fluids and insulin. Patient's blood sugars were brought down to normal level for patient in low 300s with therapy. Patient feeling better after IV fluids and insulin. Advised to followup with PCP and endocrinologist for further management of diabetes. Patient agreeable to plan. Patient care discussed with Dr. Oletta Lamas, decreased plan. Patient stable at time of discharge.         Jeannetta Ellis, PA-C 12/01/12 1610

## 2012-12-01 LAB — GLUCOSE, CAPILLARY: Glucose-Capillary: 203 mg/dL — ABNORMAL HIGH (ref 70–99)

## 2012-12-01 MED ORDER — INSULIN ASPART 100 UNIT/ML ~~LOC~~ SOLN
10.0000 [IU] | Freq: Once | SUBCUTANEOUS | Status: AC
  Administered 2012-12-01: 10 [IU] via SUBCUTANEOUS
  Filled 2012-12-01 (×2): qty 10
  Filled 2012-12-01: qty 1

## 2012-12-01 NOTE — ED Provider Notes (Signed)
Medical screening examination/treatment/procedure(s) were performed by non-physician practitioner and as supervising physician I was immediately available for consultation/collaboration.  Toshie Demelo Y. Akio Hudnall, MD 12/01/12 0249 

## 2012-12-07 ENCOUNTER — Ambulatory Visit: Payer: BC Managed Care – PPO | Admitting: *Deleted

## 2012-12-10 ENCOUNTER — Encounter: Payer: Self-pay | Admitting: Internal Medicine

## 2012-12-10 ENCOUNTER — Ambulatory Visit (INDEPENDENT_AMBULATORY_CARE_PROVIDER_SITE_OTHER): Payer: BC Managed Care – PPO | Admitting: Internal Medicine

## 2012-12-10 VITALS — BP 108/62 | HR 102 | Temp 98.4°F | Resp 10 | Wt 149.0 lb

## 2012-12-10 DIAGNOSIS — G909 Disorder of the autonomic nervous system, unspecified: Secondary | ICD-10-CM

## 2012-12-10 DIAGNOSIS — E1042 Type 1 diabetes mellitus with diabetic polyneuropathy: Secondary | ICD-10-CM

## 2012-12-10 DIAGNOSIS — E1049 Type 1 diabetes mellitus with other diabetic neurological complication: Secondary | ICD-10-CM

## 2012-12-10 DIAGNOSIS — E1010 Type 1 diabetes mellitus with ketoacidosis without coma: Secondary | ICD-10-CM

## 2012-12-10 NOTE — Progress Notes (Signed)
Subjective:     Patient ID: [name], female   DOB: [birth year], 27 y.o.   MRN: [id number]  HPI [name] is a pleasant 27 year old woman, self-referred for management of DM 1, dx 2009, uncontrolled, with complications (peripheral neuropathy), and multiple DKA admissions in the past.   She was in ED last week, as her meter kept reading HI. When she went to the ED, it was in the 400s, then decreased to 225. She also had nausea and vomiting and felt food sits in her stomach. She was told that she might have gastroparesis, and she was given a short course of Reglan. She does not feel that this is particularly helpful.  She was dx in Nov. 2009, was in DKA then. She then saw Dr. Talmage Nap and was on an insulin pump [facility] but switched insurances and had to come off the pump. She is trying to get disability. Her DM was never under control, with lowest sugar 200. However, she loved the pump, would like to have this again. Her sugars were much better when she was on this. We decided to restart her on the pump and I referred her to diabetes education at last visit, however was not able to go yet. She will see Db education on 04/28 for pre-pump training.  Patient does not bring a CBG log. Per her recall: - am: 300s - before lunch: 200s - after lunch (1h): 300s - before dinner: 300s - bedtime: up to 400s (highest 454, sometimes even HI) Has hypoglycemia awareness but unclear at what level. Lowest sugar since I last saw her was 225.  She now takes: - Lantus 40 units at night - Novolog 15 units tid - NovoLog SSI, target 150, ISF 25 She tells me that she does not miss any doses of her insulins.  She has neuropathy from DM (on Percocet, Lyrica). This is unbearable and she cannot work for 2 years. Last eye exam was last year: no DR. She will schedule another one for this year. No CKD. Last HbA1c was : Lab Results  Component Value Date   HGBA1C 12.8* 11/09/2012   I reviewed pt's  medications, allergies, PMH, social hx, family hx and no changes required, except for the fact that she was given a short course of Reglan when she was in the emergency room.  Review of Systems Constitutional: decreased appetite, + fatigue, poor sleep Eyes: + blurry vision - at the time of her ED visit, no xerophthalmia ENT: no sore throat, no nodules palpated in throat, no dysphagia/odynophagia, no hoarseness Cardiovascular: no CP/no SOB/palpitations/no leg swelling Respiratory: no cough/SOB Gastrointestinal: + N/+ V/no D/no C Musculoskeletal: no muscle/joint aches Skin: no rashes Neurological: no tremors/numbness/tingling/dizziness Psychiatric: no depression/anxiety  Objective:   Physical Exam BP 108/62  Pulse 102  Temp(Src) 98.4 F (36.9 C) (Oral)  Resp 10  Wt 149 lb (67.586 kg)  BMI 24.06 kg/m2  SpO2 97% Constitutional: normal weight, in NAD Eyes: PERRLA, EOMI, no exophthalmos ENT: moist mucous membranes, no thyromegaly, no cervical lymphadenopathy Cardiovascular: RRR, No MRG Respiratory: CTA B Gastrointestinal: abdomen soft, NT, ND, BS+ Musculoskeletal: no deformities, strength intact in all 4 Skin: moist, warm, no rashes Neurological: no tremor with outstretched hands, DTR normal in all 4 Foot exam: good pedal pulses, slightly decreased sensation to light touch on the soles of both feet, skin intact, nails intact, no deformities Assessment:     DM1, uncontrolled, with complications, with multiple DKA admissions in the past - peripheral neuropathy  Plan:     [name] has poorly controlled diabetes, and her most recent hemoglobin A1c was 12.8%, with multiple admissions for DKA in the past, with a recent ED visits for hyperglycemia, and also with significant peripheral neuropathy. She will be seen by diabetes education for insulin pump training. At last visit, I gave her the Medtronic contact information, and we also discussed about the new Medtronic pump, 530G, with  Enlite CGM.  - In the meantime, I advised her to increase her Lantus and NovoLog as follows: Please increase the Lantus to 50 units. Please keep the NovoLog at 15 units. Change the SSI as follows: - 130-140: +1 unit - 141-150: +2 units - 151-160: +3 units - 161-170: +4 units - 171-180: +5 units - 181-190: +6 units - 191-200: +7 units - 201-210: +10 units - 211-220: +11 units - 221-230: +12 units - 231-240: +13 units - 241-250: +14 units - 251-260: +15 units - > 261: +16 units - I would not start her back on metformin, since she most likely is a type I diabetic (I would have liked to check a C-peptide, but most likely her sugars higher than 200, which would render the C-peptide value unusable) - I advised her to call me if she would like me to refill her Reglan, however I do not feel that a gastric emptying study would be helpful now, since her sugars are so high. We discussed about the fact that we need to improve her sugars to help with both neuropathy and gastroparesis. - given new sugar log and advised how to fill it and to definitely bring it at next appt - I will see her back in 2-3 weeks with her sugar log

## 2012-12-10 NOTE — Patient Instructions (Addendum)
Please return in 2-3 weeks with your sugar log. Please increase the Lantus to 50 units. Please keep the NovoLog at 15 units. Change the SSI as follows: - 130-140: +1 unit - 141-150: +2 units - 151-160: +3 units - 161-170: +4 units - 171-180: +5 units - 181-190: +6 units - 191-200: +7 units - 201-210: +10 units - 211-220: +11 units - 221-230: +12 units - 231-240: +13 units - 241-250: +14 units - 251-260: +15 units - > 261: +16 units

## 2012-12-14 ENCOUNTER — Encounter: Payer: Self-pay | Admitting: *Deleted

## 2012-12-14 ENCOUNTER — Encounter: Payer: BC Managed Care – PPO | Attending: Endocrinology | Admitting: *Deleted

## 2012-12-14 VITALS — Ht 67.0 in | Wt 147.6 lb

## 2012-12-14 DIAGNOSIS — E1010 Type 1 diabetes mellitus with ketoacidosis without coma: Secondary | ICD-10-CM

## 2012-12-14 DIAGNOSIS — E109 Type 1 diabetes mellitus without complications: Secondary | ICD-10-CM | POA: Insufficient documentation

## 2012-12-14 DIAGNOSIS — Z713 Dietary counseling and surveillance: Secondary | ICD-10-CM | POA: Insufficient documentation

## 2012-12-14 DIAGNOSIS — Z9641 Presence of insulin pump (external) (internal): Secondary | ICD-10-CM | POA: Insufficient documentation

## 2012-12-14 NOTE — Progress Notes (Signed)
Introduction to Insulin Pump Therapy:  Appt start time: 1400 end time:  1500.  Assessment:  This patient has DM 1 and their primary concerns today: to get back on insulin pump.  This patient is interested in learning more about insulin pump therapy because better control and interested in the sensor She wore the Animas pump in the past but could not afford when her insurance changed. She has had frequent (greater than 5) hospitalizations in the past year often for DKA. She is not working now, applying for disability.   MEDICATIONS: Basal Insulin: 40 units of Lantus  at night via  syringe Bolus Insulin: 15 units of Novolog at each meal + Sliding Scale via  syringe Total of insulin doses per day 4+ Other diabetes medications: no longer on Metformin  Current BG meter is Prodigy and patient states they are testing 6 times per day Patient does not currently have Ketone Strips  This patient is currently adjusting bolus insulin based BG Sliding Scale This patient is not currently adjusting bolus insulin   Patient states knowledge of Carb Counting is better than Fair  Usual physical activity: limited due to neuropathy  Last A1c was 12.8% on this date 11/09/2012 Patient states complications from diabetes include neuropathy and gastroparesis  Patient states they forget to take their insulin injection on average 0 times per week Patient states they have had hypoglycemia 0 times in the past month Patient states their biggest barrier with diabetes is being uncontrolled  Patient currently is not working   Progress Towards Obtaining an Insulin PumpGoal(s):  In progress.  Patient states their expectations of pump therapy include: better BG control and use of sensor Patient expresses understanding that for improved outcomes for their diabetes on an insulin pump they will:  Check BG 4-6 times per day  Change out pump infusion set at least every 3 days  Upload pump information to software on a  regular basis so provider can assess patterns and make setting adjustments.      Intervention:    Taught difference between delivery of insulin via syringe/pen compared to insulin pump.  Demonstrated improved insulin delivery via pump due to improved accuracy of dose and flexibility of adjusting bolus insulin based on carb intake and BG correction.  Demonstrated pump, insulin reservoir and infusion set options, and button pushing for bolus delivery of insulin through the pump  Explained importance of testing BG at least 4 times per day for appropriate correction of high BG and prevention of DKA as applicable.  Emphasized importance of follow up after Pump Start for appropriate pump setting adjustments and on-going training on more advanced features.  Recommend she get Rx for [employer] that she can carry in her meter case and use to assess adequate insulin delivery from pump as needed to prevent DKA  Medtronic notified of visit completion so pump can be shipped.  Handouts given during visit include:  Carb Counting and Reading Food Labels  Menu Planner to practice recording food by food group and list carb content of foods  Monitoring/Evaluation:    Patient does want to continue with pursuit of insulin pump.  Patient instructed to provide a 3 day food diary to demonstrate understanding of carb counting  Patient instructed to go to Medtronic pump web-site to complete learning module on insulin pump and set up [facility] Account with her Username and Password  Follow up -  in 1-2 weeks for pump start if pump arrives in that time  frame.

## 2012-12-14 NOTE — Patient Instructions (Signed)
Plan:  Go to Pump Web-site and complete on-line training: [email or web address] Go to Pump Web-site and sign up for pump web-site: [facility] Let me know what User name and Password you choose What to bring for pump start:  Pump  2 Reservoirs  2 Infusion Sets  Inserter if needed  Fast Acting insulin in vial (get Rx from MD if needed ahead of time.)  Meter with strips  Fast acting carbohydrate you typically use for hypoglycemia  Completed training materials either from web-site or Learning Book Remember to take your last dose of long acting insulin the night before the pump start as we discussed prior to pump start. Take your fast acting insulin at meals as usual Any written questions you might have

## 2012-12-24 ENCOUNTER — Encounter: Payer: Self-pay | Admitting: Internal Medicine

## 2012-12-24 ENCOUNTER — Ambulatory Visit (INDEPENDENT_AMBULATORY_CARE_PROVIDER_SITE_OTHER): Payer: BC Managed Care – PPO | Admitting: Internal Medicine

## 2012-12-24 VITALS — BP 104/68 | HR 85 | Temp 98.5°F | Resp 10 | Ht 66.0 in | Wt 143.0 lb

## 2012-12-24 DIAGNOSIS — E1010 Type 1 diabetes mellitus with ketoacidosis without coma: Secondary | ICD-10-CM

## 2012-12-24 NOTE — Progress Notes (Signed)
Subjective:     Patient ID: [name], female   DOB: 10/03/1985, 27 y.o.   MRN: [id number]  HPI [name] is a pleasant 27 year old woman, self-referred for management of DM 1, dx 2009, uncontrolled, with complications (peripheral neuropathy, ? gastroparesis), and multiple DKA admissions in the past. Last visit 2 weeks ago.  Patient's diabetes is complicated by nausea and vomiting and sensation that food sits in her stomach. She was told that she might have gastroparesis, and she was given a short course of Reglan. She does not feel that this is particularly helpful.  She saw Dr. Talmage Nap in the past and was on an insulin pump (Animas), but switched insurances and had to come off the pump. She is trying to get disability. At last visit, I did her materials for the Nnew Medtronic pump >> this will arrive today.  Patient brings her sugar log and she checks her sugars 4 times a day.  - am: 300s >> 155-399 - before lunch: 200s >> low 200s - before dinner: 300s >> high 200s -low 300s - one hour after dinner: best sugars 149-252 Has hypoglycemia awareness but unclear at what level. Lowest sugar since I last saw her was 149  She now takes: - Lantus 50 units at night (increased from 40 units at last visit) - Novolog 15 units tid - NovoLog SSI, target 150, ISF 10 (increased from 25 at last visit) She tells me that she does not miss any doses of her insulins. She ends up giving herself 22 units with every meal.   Nighttime snack: bag of chips.   She has neuropathy from DM (on Percocet, Lyrica). She could not work for the last 2 years. Last eye exam was last year: no DR. She will schedule another one for this year. No CKD. Last HbA1c was : Lab Results  Component Value Date   HGBA1C 12.8* 11/09/2012   I reviewed pt's medications, allergies, PMH, social hx, family hx and no changes required.   Review of Systems Constitutional: decreased appetite due to nausea, + fatigue, +  poor sleep Eyes: no blurry vision, no xerophthalmia ENT: no sore throat, no nodules palpated in throat, no dysphagia/odynophagia, no hoarseness Cardiovascular: no CP/no SOB/palpitations/no leg swelling Respiratory: no cough/SOB Gastrointestinal: + N/+ V/no D/no C Musculoskeletal: no muscle/joint aches Skin: no rashes, + hair loss Neurological: no tremors/numbness/tingling/dizziness Psychiatric: no depression/anxiety  Objective:   Physical Exam BP 104/68  Pulse 85  Temp(Src) 98.5 F (36.9 C) (Oral)  Resp 10  Ht 5\' 6"  (1.676 m)  Wt 143 lb (64.864 kg)  BMI 23.09 kg/m2  SpO2 97% Wt Readings from Last 3 Encounters:  12/24/12 143 lb (64.864 kg)  12/14/12 147 lb 9.6 oz (66.951 kg)  12/10/12 149 lb (67.586 kg)   Constitutional: normal weight, in NAD Eyes: PERRLA, EOMI, no exophthalmos ENT: moist mucous membranes, no thyromegaly, no cervical lymphadenopathy Cardiovascular: RRR, No MRG Respiratory: CTA B Gastrointestinal: abdomen soft, NT, ND, BS+ Musculoskeletal: no deformities, strength intact in all 4 Skin: moist, warm, no rashes Neurological: no tremor with outstretched hands, DTR normal in all 4 Foot exam: good pedal pulses, slightly decreased sensation to light touch on the soles of both feet, skin intact, nails intact, no deformities6 Assessment:     DM1, uncontrolled, with complications, with multiple DKA admissions in the past - peripheral neuropathy     Plan:     [name] has poorly controlled diabetes, and her most recent hemoglobin A1c was 12.8%, with  multiple admissions for DKA in the past, with a recent ED visits for hyperglycemia, and also with significant peripheral neuropathy. She will get her new Medtronic pump, 530G today, but we are not sure when she can be seen by diabetes education install the pump. The pump took longer to arrive tthen planned. - sugars are in the 200-300 range, highest sugar being at 572 yesterday morning. For now, I advised her to increase  the Lantus to 60 units. - her blood sugars are after dinner, however the difference between after dinner and early morning sugars are more than double, and this is likely due to her late-night snacking. I advised her to try to stay away from the snacks, she does not need this to maintain sugars during the night. - For now, I would not make further changes, since she would have her pump attached soon. - She is writing down her sugars and I advised her to continue to do that - I will see her back in a month to review her sugars after installing the pump

## 2012-12-24 NOTE — Patient Instructions (Addendum)
Please return in 1 month with your sugar log. Increase Lantus to 60 units. Please skip the late night snack. Please check post meal sugars after 2h of starting the meal.

## 2013-01-06 ENCOUNTER — Encounter: Payer: BC Managed Care – PPO | Attending: Endocrinology | Admitting: *Deleted

## 2013-01-06 DIAGNOSIS — Z713 Dietary counseling and surveillance: Secondary | ICD-10-CM | POA: Insufficient documentation

## 2013-01-06 DIAGNOSIS — E109 Type 1 diabetes mellitus without complications: Secondary | ICD-10-CM | POA: Insufficient documentation

## 2013-01-06 DIAGNOSIS — Z9641 Presence of insulin pump (external) (internal): Secondary | ICD-10-CM | POA: Insufficient documentation

## 2013-01-14 ENCOUNTER — Telehealth: Payer: Self-pay | Admitting: *Deleted

## 2013-01-14 NOTE — Progress Notes (Signed)
Insulin Pump Start Progress Note:  Patient appointment start time: 1500  End time 1700  Patient here for insulin pump start on Medtronic 530G pump and Quick Set infusion set Orders with pump settings received from MD Patient completed Pre- training by return demonstration  Reviewed Pump Set Up including  Menu Settings  Bolus with Carb Ratio of 1 unit / 8 grams Carb, Correction Factor of 1 unit / 26 mg/dl  Suspend  Basal with initial Basal Rate of 1.35 units/hour  Reservoir Set Up  Utilities Pump Training Checklist completed Used Temp Basal of 5 hour duration @ 5 % basal due to patient taking their long acting insulin yesterday at 10 PM  Patient is signed up for [employer] and agrees to upload by Friday AM, 01/08/2013 for review of progress and allow for pump setting adjustments  Patient successfully completed pump start and instructed to call me if BG drops below 60 mg/dl or goes above 161 mg/dl or as directed by MD  Follow up plan: patient to use pump as directed and upload to [facility] for routine evaluation of BGs and pump adjustments accordingly

## 2013-01-28 ENCOUNTER — Ambulatory Visit: Payer: BC Managed Care – PPO | Admitting: Internal Medicine

## 2013-01-28 DIAGNOSIS — Z0289 Encounter for other administrative examinations: Secondary | ICD-10-CM

## 2013-02-02 ENCOUNTER — Ambulatory Visit (INDEPENDENT_AMBULATORY_CARE_PROVIDER_SITE_OTHER): Payer: BC Managed Care – PPO | Admitting: Internal Medicine

## 2013-02-02 ENCOUNTER — Ambulatory Visit: Payer: BC Managed Care – PPO | Admitting: Internal Medicine

## 2013-02-02 ENCOUNTER — Encounter: Payer: Self-pay | Admitting: Internal Medicine

## 2013-02-02 VITALS — BP 120/62 | HR 89 | Temp 98.8°F | Resp 10 | Ht 66.0 in | Wt 153.0 lb

## 2013-02-02 DIAGNOSIS — E1010 Type 1 diabetes mellitus with ketoacidosis without coma: Secondary | ICD-10-CM

## 2013-02-02 NOTE — Progress Notes (Signed)
Subjective:     Patient ID: [name], female   DOB: [birth year], 27 y.o.   MRN: [id number]  HPI Ms. Garner is a pleasant 27 year old woman, self-referred for management of DM 1, dx 2009, uncontrolled, with complications (peripheral neuropathy, ? gastroparesis), and multiple DKA admissions in the past. Last visit 1.5 mo ago.  She was on an insulin pump (Animas), but switched insurances and had to come off the pump. She is trying to get disability. She now has a new Medtronic 530G pump - started <1 mo ago.   Pump settings: - basal rates: 1.35 units/h >> 32.4 units TDD - ICR: 8 - target: 120 - ISF: 26 - Bolus wizard: on - IOB: 4h - primes: every 3 days She gets approx 7 units from bolusing >> ~21 units TDD No problems with pump infusion sites.  Reviewing her pump, the patient has gaps of several days in bolusing, and she tells me that the pump is not recording her bolusing, despite the fact that she is bolusing with every meal. She also injects between meals, as most of the boluses I see recorded are associated with 0 carbohydrates and sugars in the 300s and 400s (he gets approximately 10-12 units as correction for these highs) regardless of the time of the day, even at bedtime. She tells me that she called technical support about this problem and there is a possibility that he might need to replace the pump if this problem continues. She will see Pincus Large, diabetes educator today, and they will try to troubleshoot this together.  At last visit, insulin regimen was: - Lantus 60 units at night (increased from 50 units at last visit) - Novolog 15 units tid - NovoLog SSI, target 150, ISF 10  She ended up giving herself 22 units with every meal.   Patient does not bring a sugar log today, per her recall.  - am: 300s >> 155-399 >> 200s now - before lunch: 200s >> low 200s >> 150s now - before dinner: 300s >> high 200s -low 300s >> 200-300s - one hour after dinner:  best sugars 149-252 >> not checking - bedtime: 150s Has hypoglycemia awareness but unclear at what level. Highest sugar after starting the pump = 307.  Last HbA1c was : Lab Results  Component Value Date   HGBA1C 12.8* 11/09/2012   She has neuropathy from DM (on Percocet, Lyrica). She could not work for the last 2 years. Last eye exam was last year: no DR. She will schedule another one for this year. No CKD. She was told that she might have gastroparesis, and she was given a short course of Reglan. She does not feel that this is particularly helpful.  I reviewed pt's medications, allergies, PMH, social hx, family hx and no changes required.   Review of Systems Constitutional: no weight gain/loss (per our scale she gained 10 lbs), + fatigue, + poor sleep Eyes: occasional blurry vision when sugars high, no xerophthalmia ENT: no sore throat, no nodules palpated in throat, no dysphagia/odynophagia, no hoarseness Cardiovascular: no CP/no SOB/palpitations/no leg swelling Respiratory: no cough/SOB Gastrointestinal: no N/no V/no D/no C Musculoskeletal: no muscle/joint aches Skin: no rashes, + hair loss Neurological: no tremors/numbness/tingling/dizziness Psychiatric: no depression/anxiety  Objective:   Physical Exam BP 120/62  Pulse 89  Temp(Src) 98.8 F (37.1 C) (Oral)  Resp 10  Ht 5\' 6"  (1.676 m)  Wt 153 lb (69.4 kg)  BMI 24.71 kg/m2  SpO2 98% Wt Readings from Last 3 Encounters:  02/02/13 153 lb (69.4 kg)  12/24/12 143 lb (64.864 kg)  12/14/12 147 lb 9.6 oz (66.951 kg)   Constitutional: normal weight, in NAD Eyes: PERRLA, EOMI, no exophthalmos ENT: moist mucous membranes, no thyromegaly, no cervical lymphadenopathy Cardiovascular: RRR, No MRG Respiratory: CTA B Gastrointestinal: abdomen soft, NT, ND, BS+ Musculoskeletal: no deformities, strength intact in all 4 Skin: moist, warm, no rashes Neurological: no tremor with outstretched hands, DTR normal in all 4 Foot exam: good  pedal pulses, slightly decreased sensation to light touch on the soles of both feet, skin intact, nails intact, no deformities6 Assessment:     DM1, uncontrolled, with complications, with multiple DKA admissions in the past - peripheral neuropathy     Plan:     Ms. Carmin Muskrat has poorly controlled diabetes, and her most recent hemoglobin A1c was 12.8%, with multiple admissions for DKA in the past and also with significant peripheral neuropathy. She obtained her new Medtronic pump, 530G, she started it < a month ago. She has problems getting the pump to record her boluses >> hopefully these will be solved soon. She is getting 1/2 of her previous basal insulin TDD from pump, with better sugars! - she still has sugars in the 200-300 range, but it is difficult to know how to change the bolus parameters without a sugar log or bolus log... - for now, since she can bolus >10 units at bedtime without using carbs or sometimes with only 15 g carbs, so I advised her to decrease the amount of insulin injected at that time, by increasing the target from 120 to 150 and by changing the ICR from 1:26 to 1:50. I explained that this is a measure to decrease possible lows, but the highs might persist - I advised her to also change her target for the rest of the day from 100-120 to 120-120. - will take Metformin off her list - given samples of Novolog - RTC in 2-3 weeks

## 2013-02-02 NOTE — Patient Instructions (Addendum)
Please change the: - target from 100-120 to 120-120 for most of the day except: - target from 120 to 150 from 8 pm to 12 pm - ISF from 26 to 50 from 8 pm to 12 pm Try to get the pump to record all your bolusing Limit bolusing between meals. Use the Bolus Wizard.  Please return in 2-3 weeks.

## 2013-02-22 ENCOUNTER — Emergency Department (HOSPITAL_COMMUNITY)
Admission: EM | Admit: 2013-02-22 | Discharge: 2013-02-22 | Disposition: A | Discharge status: Home / Self Care | Payer: BC Managed Care – PPO | Attending: Emergency Medicine | Admitting: Emergency Medicine

## 2013-02-22 ENCOUNTER — Encounter (HOSPITAL_COMMUNITY): Payer: Self-pay | Admitting: *Deleted

## 2013-02-22 ENCOUNTER — Ambulatory Visit: Payer: BC Managed Care – PPO | Admitting: Internal Medicine

## 2013-02-22 DIAGNOSIS — Z794 Long term (current) use of insulin: Secondary | ICD-10-CM | POA: Insufficient documentation

## 2013-02-22 DIAGNOSIS — R112 Nausea with vomiting, unspecified: Secondary | ICD-10-CM | POA: Insufficient documentation

## 2013-02-22 DIAGNOSIS — E1069 Type 1 diabetes mellitus with other specified complication: Secondary | ICD-10-CM | POA: Insufficient documentation

## 2013-02-22 DIAGNOSIS — Z79899 Other long term (current) drug therapy: Secondary | ICD-10-CM | POA: Insufficient documentation

## 2013-02-22 DIAGNOSIS — Z9641 Presence of insulin pump (external) (internal): Secondary | ICD-10-CM | POA: Insufficient documentation

## 2013-02-22 DIAGNOSIS — G589 Mononeuropathy, unspecified: Secondary | ICD-10-CM | POA: Insufficient documentation

## 2013-02-22 DIAGNOSIS — E1142 Type 2 diabetes mellitus with diabetic polyneuropathy: Secondary | ICD-10-CM | POA: Insufficient documentation

## 2013-02-22 DIAGNOSIS — E162 Hypoglycemia, unspecified: Secondary | ICD-10-CM

## 2013-02-22 DIAGNOSIS — R55 Syncope and collapse: Secondary | ICD-10-CM | POA: Insufficient documentation

## 2013-02-22 LAB — GLUCOSE, CAPILLARY: Glucose-Capillary: 39 mg/dL — CL (ref 70–99)

## 2013-02-22 LAB — CBC WITH DIFFERENTIAL/PLATELET
Basophils Absolute: 0 10*3/uL (ref 0.0–0.1)
Eosinophils Absolute: 0.2 10*3/uL (ref 0.0–0.7)
Eosinophils Relative: 2 % (ref 0–5)
MCH: 31 pg (ref 26.0–34.0)
MCV: 87.6 fL (ref 78.0–100.0)
Monocytes Absolute: 0.5 10*3/uL (ref 0.1–1.0)
Platelets: 349 10*3/uL (ref 150–400)
RDW: 12.3 % (ref 11.5–15.5)

## 2013-02-22 LAB — COMPREHENSIVE METABOLIC PANEL
ALT: 18 U/L (ref 0–35)
AST: 24 U/L (ref 0–37)
Calcium: 9.1 mg/dL (ref 8.4–10.5)
Creatinine, Ser: 0.59 mg/dL (ref 0.50–1.10)
GFR calc Af Amer: >90 mL/min (ref >90)
Glucose, Bld: 231 mg/dL — ABNORMAL HIGH (ref 70–99)
Sodium: 133 mEq/L — ABNORMAL LOW (ref 135–145)
Total Protein: 7.3 g/dL (ref 6.0–8.3)

## 2013-02-22 MED ORDER — DEXTROSE 50 % IV SOLN
INTRAVENOUS | Status: AC
  Administered 2013-02-22: 50 mL via INTRAVENOUS
  Filled 2013-02-22: qty 50

## 2013-02-22 MED ORDER — DEXTROSE 50 % IV SOLN: 50.0000 mL | Freq: Once | INTRAVENOUS | Status: AC | PRN

## 2013-02-22 NOTE — ED Notes (Signed)
Pt in c/o hypoglycemia, states she is a type 1 diabetic and today she first started feeling bad around noon and she check her sugar, states it was low so she ate something, she then vomited, states she hasn't been able to keep any food down but she is able to keep fluids down so she has been drinking juice, states she suspended her insulin pump. States she has never had trouble in the past with her sugar being low. Pt noted to be pale and diaphoretic during triage. Pt states she has had a cold over the last few days but denies any other symptoms, denies fever at home.

## 2013-02-22 NOTE — ED Provider Notes (Signed)
History    CSN: [id number] Arrival date & time 02/22/13  1737  First MD Initiated Contact with Patient 02/22/13 1809     Chief Complaint  Patient presents with  . Hypoglycemia   (Consider location/radiation/quality/duration/timing/severity/associated sxs/prior Treatment) HPI Patient presents with concerns of hypoglycemia, nausea, vomiting. She states that approximately 5 hours ago she began to feel nauseous.  About that time she started having episodes of emesis.  She states that soon after she stopped her insulin.  She continued to feel poorly, listless, with a complete syncope. She denies any pain on my exam, having already received dextrose, fluids. She states that she generally well aside from her diabetes. Diabetes is thought to be generally well managed.   Past Medical History  Diagnosis Date  . Diabetes mellitus   . Neuropathy 07/2012  . Chronic painful diabetic neuropathy    Past Surgical History  Procedure Laterality Date  . Cesarean section    . Cesarean section     Family History  Problem Relation Age of Onset  . Hypertension Mother    History  Substance Use Topics  . Smoking status: Never Smoker   . Smokeless tobacco: Never Used  . Alcohol Use: No   OB History   Grav Para Term Preterm Abortions TAB SAB Ect Mult Living                 Review of Systems  All other systems reviewed and are negative.    Allergies  Pramipexole  Home Medications   Current Outpatient Rx  Name  Route  Sig  Dispense  Refill  . Insulin Human (INSULIN PUMP) 100 unit/ml SOLN   Subcutaneous   Inject into the skin continuous. Novolog         . pregabalin (LYRICA) 75 MG capsule   Oral   Take 150 mg by mouth 2 (two) times daily.           BP 119/72  Pulse 82  Resp 18  Wt 153 lb (69.4 kg)  BMI 24.71 kg/m2  SpO2 100% Physical Exam  Nursing note and vitals reviewed. Constitutional: She is oriented to person, place, and time. She appears well-developed and  well-nourished. No distress.  HENT:  Head: Normocephalic and atraumatic.  Eyes: Conjunctivae and EOM are normal.  Cardiovascular: Normal rate and regular rhythm.   Pulmonary/Chest: Effort normal and breath sounds normal. No stridor. No respiratory distress.  Abdominal: She exhibits no distension.  Musculoskeletal: She exhibits no edema.  Neurological: She is alert and oriented to person, place, and time. No cranial nerve deficit.  Skin: Skin is warm and dry.  Psychiatric: She has a normal mood and affect.    ED Course  Procedures (including critical care time) Labs Reviewed  GLUCOSE, CAPILLARY - Abnormal; Notable for the following:    Glucose-Capillary 34 (*)    All other components within normal limits  GLUCOSE, CAPILLARY - Abnormal; Notable for the following:    Glucose-Capillary 39 (*)    All other components within normal limits  COMPREHENSIVE METABOLIC PANEL - Abnormal; Notable for the following:    Sodium 133 (*)    Potassium 2.8 (*)    Chloride 95 (*)    Glucose, Bld 231 (*)    Total Bilirubin 0.2 (*)    All other components within normal limits  GLUCOSE, CAPILLARY - Abnormal; Notable for the following:    Glucose-Capillary 188 (*)    All other components within normal limits  CBC WITH DIFFERENTIAL  No results found. No diagnosis found.   After the patient's initial Accu-Chek was critically low she received dextrose.  Following my initial evaluation she continued to receive fluids, and intolerant of food.  Several repeat orthostatics were reassuring.  MDM  Percent female with insulin-dependent diabetes presents of hypoglycemia, nausea, vomiting.  On exam she is awake and alert, eventually recovering to tolerate food.  She also endorses complete return to baseline. With this returned to baseline, reassuring labs, reassuring Accu-Cheks, the patient was discharged in stable condition without turning her insulin pump back on, to follow up with her endocrinologist  tomorrow.  Gerhard Munch, MD 02/22/13 2039

## 2013-02-22 NOTE — ED Notes (Signed)
CBG 160 

## 2013-02-23 LAB — GLUCOSE, CAPILLARY
Glucose-Capillary: 34 mg/dL — CL (ref 70–99)
Glucose-Capillary: 103 mg/dL — ABNORMAL HIGH (ref 70–99)
Glucose-Capillary: 160 mg/dL — ABNORMAL HIGH (ref 70–99)

## 2013-03-10 ENCOUNTER — Ambulatory Visit: Payer: BC Managed Care – PPO | Admitting: *Deleted

## 2013-03-11 ENCOUNTER — Telehealth: Payer: Self-pay | Admitting: *Deleted

## 2013-03-11 MED ORDER — GLUCOSE BLOOD VI STRP: 6.0000 | ORAL_STRIP | Freq: Every day | Status: DC

## 2013-03-11 NOTE — Telephone Encounter (Signed)
Left message on machine for patient to find out if patient needs a new Rx for test strips for her One Touch Verio glucometer

## 2013-03-23 ENCOUNTER — Ambulatory Visit: Payer: Self-pay | Admitting: Internal Medicine

## 2013-04-04 ENCOUNTER — Encounter (HOSPITAL_COMMUNITY): Payer: Self-pay

## 2013-04-04 ENCOUNTER — Inpatient Hospital Stay (HOSPITAL_COMMUNITY): Payer: BC Managed Care – PPO

## 2013-04-04 ENCOUNTER — Inpatient Hospital Stay (HOSPITAL_COMMUNITY)
Admission: AD | Admit: 2013-04-04 | Discharge: 2013-04-04 | Disposition: A | Discharge status: Home / Self Care | Payer: BC Managed Care – PPO | Source: Ambulatory Visit | Attending: Obstetrics & Gynecology | Admitting: Obstetrics & Gynecology

## 2013-04-04 DIAGNOSIS — E119 Type 2 diabetes mellitus without complications: Secondary | ICD-10-CM | POA: Insufficient documentation

## 2013-04-04 DIAGNOSIS — N938 Other specified abnormal uterine and vaginal bleeding: Secondary | ICD-10-CM | POA: Insufficient documentation

## 2013-04-04 DIAGNOSIS — N83209 Unspecified ovarian cyst, unspecified side: Secondary | ICD-10-CM | POA: Insufficient documentation

## 2013-04-04 DIAGNOSIS — D259 Leiomyoma of uterus, unspecified: Secondary | ICD-10-CM

## 2013-04-04 DIAGNOSIS — N949 Unspecified condition associated with female genital organs and menstrual cycle: Secondary | ICD-10-CM | POA: Insufficient documentation

## 2013-04-04 LAB — URINALYSIS, ROUTINE W REFLEX MICROSCOPIC
Ketones, ur: NEGATIVE mg/dL
Leukocytes, UA: NEGATIVE
Nitrite: NEGATIVE
Protein, ur: 100 mg/dL — AB
Urobilinogen, UA: 0.2 mg/dL (ref 0.0–1.0)

## 2013-04-04 LAB — CBC WITH DIFFERENTIAL/PLATELET
Basophils Relative: 0 % (ref 0–1)
Eosinophils Absolute: 0.2 10*3/uL (ref 0.0–0.7)
Eosinophils Relative: 3 % (ref 0–5)
Lymphs Abs: 2.7 10*3/uL (ref 0.7–4.0)
MCH: 30.6 pg (ref 26.0–34.0)
MCHC: 35 g/dL (ref 30.0–36.0)
MCV: 87.3 fL (ref 78.0–100.0)
Neutrophils Relative %: 49 % (ref 43–77)
Platelets: 259 10*3/uL (ref 150–400)
RBC: 4.02 MIL/uL (ref 3.87–5.11)

## 2013-04-04 LAB — WET PREP, GENITAL
Trich, Wet Prep: NONE SEEN
Yeast Wet Prep HPF POC: NONE SEEN

## 2013-04-04 LAB — URINE MICROSCOPIC-ADD ON

## 2013-04-04 MED ORDER — KETOROLAC TROMETHAMINE 60 MG/2ML IM SOLN
60.0000 mg | Freq: Once | INTRAMUSCULAR | Status: AC
  Administered 2013-04-04: 60 mg via INTRAMUSCULAR
  Filled 2013-04-04: qty 2

## 2013-04-04 NOTE — MAU Note (Signed)
Pt states for past week has had to change pad/tampon q30 minutes. Normally menstrual cycles last 3-4 days. Bleeding has become progressively worse. Has felt nauseated, however denies diarrhea or constipation. Cramping in lower pelvic area bilaterally into lower abdominal area

## 2013-04-04 NOTE — MAU Provider Note (Signed)
History     CSN: [id number]  Arrival date and time: 04/04/13 1211   First Provider Initiated Contact with Patient 04/04/13 1320      Chief Complaint  Patient presents with  . Vaginal Bleeding   Vaginal Bleeding    [name] is a 27 y.o. who presents today with vaginal bleeding x 3 weeks. She states that her LMP was 03/14/13 and it was normal. However, after it ended she started having spotting and then last week it became slightly heavier and last night she passed a few dime sized clots. She also reports 8/10 cramping, and she states that she took motrin last night, but it did not help with the pain. She is followed by an endocrinologist for diabetes and she states that she told him about the cramps, but he did not have any suggestions. She states that her periods are normal, and this has never happened in the past today. She is concerned about fibroids or endometriosis.   Past Medical History  Diagnosis Date  . Diabetes mellitus   . Neuropathy 07/2012  . Chronic painful diabetic neuropathy     Past Surgical History  Procedure Laterality Date  . Cesarean section    . Cesarean section    . Dilation and curettage of uterus      Family History  Problem Relation Age of Onset  . Hypertension Mother     History  Substance Use Topics  . Smoking status: Never Smoker   . Smokeless tobacco: Never Used  . Alcohol Use: No    Allergies:  Allergies  Allergen Reactions  . Pramipexole Shortness Of Breath    Prescriptions prior to admission  Medication Sig Dispense Refill  . glucose blood test strip 6 each by Other route daily.      Marland Kitchen ibuprofen (ADVIL,MOTRIN) 200 MG tablet Take 400 mg by mouth every 6 (six) hours as needed for pain.      Marland Kitchen insulin aspart (NOVOLOG) 100 UNIT/ML injection Inject 10-20 Units into the skin 3 (three) times daily with meals.      . pregabalin (LYRICA) 75 MG capsule Take 150 mg by mouth 2 (two) times daily.       . [DISCONTINUED]  glucose blood (ONETOUCH VERIO) test strip 6 each by Other route daily.  200 each  3    Review of Systems  Genitourinary: Positive for vaginal bleeding.   Physical Exam   Blood pressure 124/76, pulse 95, temperature 98.3 F (36.8 C), temperature source Oral, resp. rate 18, height 5\' 7"  (1.702 m), weight 70.217 kg (154 lb 12.8 oz), last menstrual period 03/14/2013.  Physical Exam  Nursing note and vitals reviewed. Constitutional: She is oriented to person, place, and time. She appears well-developed and well-nourished. No distress.  Cardiovascular: Normal rate.   Respiratory: Effort normal.  GI: Soft. There is no tenderness.  Genitourinary:   External: no lesion Vagina: small amount of blood seen Cervix: pink, smooth, no CMT Uterus: NSSC Adnexa: NT   Neurological: She is alert and oriented to person, place, and time.  Skin: Skin is warm and dry.  Psychiatric: She has a normal mood and affect.    MAU Course  Procedures  Results for orders placed during the hospital encounter of 04/04/13 (from the past 24 hour(s))  URINALYSIS, ROUTINE W REFLEX MICROSCOPIC     Status: Abnormal   Collection Time    04/04/13 12:30 PM      Result Value Range   Color, Urine RED (*)  YELLOW   APPearance CLOUDY (*) CLEAR   Specific Gravity, Urine >1.030 (*) 1.005 - 1.030   pH 5.5  5.0 - 8.0   Glucose, UA NEGATIVE  NEGATIVE mg/dL   Hgb urine dipstick LARGE (*) NEGATIVE   Bilirubin Urine SMALL (*) NEGATIVE   Ketones, ur NEGATIVE  NEGATIVE mg/dL   Protein, ur 295 (*) NEGATIVE mg/dL   Urobilinogen, UA 0.2  0.0 - 1.0 mg/dL   Nitrite NEGATIVE  NEGATIVE   Leukocytes, UA NEGATIVE  NEGATIVE  URINE MICROSCOPIC-ADD ON     Status: Abnormal   Collection Time    04/04/13 12:30 PM      Result Value Range   Squamous Epithelial / LPF FEW (*) RARE   WBC, UA 3-6  <3 WBC/hpf   RBC / HPF TOO NUMEROUS TO COUNT  <3 RBC/hpf   Bacteria, UA MANY (*) RARE  POCT PREGNANCY, URINE     Status: None   Collection Time     04/04/13 12:36 PM      Result Value Range   Preg Test, Ur NEGATIVE  NEGATIVE  CBC WITH DIFFERENTIAL     Status: Abnormal   Collection Time    04/04/13  1:20 PM      Result Value Range   WBC 6.9  4.0 - 10.5 K/uL   RBC 4.02  3.87 - 5.11 MIL/uL   Hemoglobin 12.3  12.0 - 15.0 g/dL   HCT 62.1 (*) 30.8 - 65.7 %   MCV 87.3  78.0 - 100.0 fL   MCH 30.6  26.0 - 34.0 pg   MCHC 35.0  30.0 - 36.0 g/dL   RDW 84.6  96.2 - 95.2 %   Platelets 259  150 - 400 K/uL   Neutrophils Relative % 49  43 - 77 %   Neutro Abs 3.4  1.7 - 7.7 K/uL   Lymphocytes Relative 39  12 - 46 %   Lymphs Abs 2.7  0.7 - 4.0 K/uL   Monocytes Relative 9  3 - 12 %   Monocytes Absolute 0.6  0.1 - 1.0 K/uL   Eosinophils Relative 3  0 - 5 %   Eosinophils Absolute 0.2  0.0 - 0.7 K/uL   Basophils Relative 0  0 - 1 %   Basophils Absolute 0.0  0.0 - 0.1 K/uL  WET PREP, GENITAL     Status: Abnormal   Collection Time    04/04/13  1:38 PM      Result Value Range   Yeast Wet Prep HPF POC NONE SEEN  NONE SEEN   Trich, Wet Prep NONE SEEN  NONE SEEN   Clue Cells Wet Prep HPF POC NONE SEEN  NONE SEEN   WBC, Wet Prep HPF POC FEW (*) NONE SEEN   US Transvaginal Non-ob  04/04/2013   *RADIOLOGY REPORT*  Clinical Data: Abnormal uterine bleeding.  TRANSABDOMINAL AND TRANSVAGINAL ULTRASOUND OF PELVIS Technique:  Both transabdominal and transvaginal ultrasound examinations of the pelvis were performed. Transabdominal technique was performed for global imaging of the pelvis including uterus, ovaries, adnexal regions, and pelvic cul-de-sac.  It was necessary to proceed with endovaginal exam following the transabdominal exam to visualize the intrauterine contents more accurately.  Comparison:  01/31/2006  Findings:  Uterus: 10.3 x 4.0 x 5.4 cm.  Small leiomyoma in the fundus, measuring 1 x 1.2 x 1.3 cm of no likely significance.  Endometrium: Normal at 2.6 mm.  Right ovary:  Measures and total 4.7 x 3.5 x 4.3 cm.  There is a 4.4 x 3.1 x 3.9 cm  simple appearing cyst.  Other normal appearing follicles.  Left ovary: Normal appearance measuring 3.1 x 1.3 x 2.1 cm.  Other findings: No free fluid  IMPRESSION: Approximately 4 cm simple cyst of the right ovary.  1 cm fundal uterine fibroid.  Otherwise normal appearance of the uterus, ovaries and endometrium.   Original Report Authenticated By: Paulina Fusi, M.D.   US Pelvis Complete  04/04/2013   *RADIOLOGY REPORT*  Clinical Data: Abnormal uterine bleeding.  TRANSABDOMINAL AND TRANSVAGINAL ULTRASOUND OF PELVIS Technique:  Both transabdominal and transvaginal ultrasound examinations of the pelvis were performed. Transabdominal technique was performed for global imaging of the pelvis including uterus, ovaries, adnexal regions, and pelvic cul-de-sac.  It was necessary to proceed with endovaginal exam following the transabdominal exam to visualize the intrauterine contents more accurately.  Comparison:  01/31/2006  Findings:  Uterus: 10.3 x 4.0 x 5.4 cm.  Small leiomyoma in the fundus, measuring 1 x 1.2 x 1.3 cm of no likely significance.  Endometrium: Normal at 2.6 mm.  Right ovary:  Measures and total 4.7 x 3.5 x 4.3 cm.  There is a 4.4 x 3.1 x 3.9 cm simple appearing cyst.  Other normal appearing follicles.  Left ovary: Normal appearance measuring 3.1 x 1.3 x 2.1 cm.  Other findings: No free fluid  IMPRESSION: Approximately 4 cm simple cyst of the right ovary.  1 cm fundal uterine fibroid.  Otherwise normal appearance of the uterus, ovaries and endometrium.   Original Report Authenticated By: Paulina Fusi, M.D.     Assessment and Plan   1. Fibroid, uterine    Keep menstrual calendar FU with GYN clinic  Tawnya Crook 04/04/2013, 1:30 PM

## 2013-04-04 NOTE — MAU Note (Signed)
2 small spots of blood on pad in room

## 2013-04-04 NOTE — MAU Note (Signed)
Pt reports she has been having vaginal bleeding x 3 weeks started on 03/14/13( her normal cycle). Reports cramping and pain not relieved by ibuprofen. Not currently on any birth control except condoms. Stated bleeding got heavier last night.

## 2013-04-05 LAB — GC/CHLAMYDIA PROBE AMP
CT Probe RNA: NEGATIVE
GC Probe RNA: NEGATIVE

## 2013-04-05 LAB — URINE CULTURE: Colony Count: >=100000

## 2013-04-05 NOTE — MAU Provider Note (Signed)
Attestation of Attending Supervision of Advanced Practitioner (CNM/NP): Evaluation and management procedures were performed by the Advanced Practitioner under my supervision and collaboration. I have reviewed the Advanced Practitioner's note and chart, and I agree with the management and plan.  Nitish Roes H. 8:34 AM   

## 2013-05-19 ENCOUNTER — Encounter: Payer: BC Managed Care – PPO | Admitting: Nurse Practitioner

## 2013-06-24 ENCOUNTER — Other Ambulatory Visit: Payer: Self-pay

## 2013-08-19 ENCOUNTER — Encounter (HOSPITAL_COMMUNITY): Payer: Self-pay | Admitting: Emergency Medicine

## 2013-08-19 DIAGNOSIS — E1142 Type 2 diabetes mellitus with diabetic polyneuropathy: Secondary | ICD-10-CM | POA: Insufficient documentation

## 2013-08-19 DIAGNOSIS — E860 Dehydration: Secondary | ICD-10-CM | POA: Insufficient documentation

## 2013-08-19 DIAGNOSIS — R112 Nausea with vomiting, unspecified: Secondary | ICD-10-CM | POA: Insufficient documentation

## 2013-08-19 DIAGNOSIS — E1049 Type 1 diabetes mellitus with other diabetic neurological complication: Secondary | ICD-10-CM | POA: Insufficient documentation

## 2013-08-19 DIAGNOSIS — Z3202 Encounter for pregnancy test, result negative: Secondary | ICD-10-CM | POA: Insufficient documentation

## 2013-08-19 DIAGNOSIS — G8929 Other chronic pain: Secondary | ICD-10-CM | POA: Insufficient documentation

## 2013-08-19 DIAGNOSIS — Z79899 Other long term (current) drug therapy: Secondary | ICD-10-CM | POA: Insufficient documentation

## 2013-08-19 DIAGNOSIS — R197 Diarrhea, unspecified: Secondary | ICD-10-CM | POA: Insufficient documentation

## 2013-08-19 DIAGNOSIS — Z794 Long term (current) use of insulin: Secondary | ICD-10-CM | POA: Insufficient documentation

## 2013-08-19 LAB — CBC WITH DIFFERENTIAL/PLATELET
Basophils Absolute: 0 10*3/uL (ref 0.0–0.1)
Basophils Relative: 0 % (ref 0–1)
EOS PCT: 3 % (ref 0–5)
Eosinophils Absolute: 0.2 10*3/uL (ref 0.0–0.7)
HEMATOCRIT: 40.9 % (ref 36.0–46.0)
HEMOGLOBIN: 14.4 g/dL (ref 12.0–15.0)
LYMPHS ABS: 1.7 10*3/uL (ref 0.7–4.0)
LYMPHS PCT: 25 % (ref 12–46)
MCH: 30.9 pg (ref 26.0–34.0)
MCHC: 35.2 g/dL (ref 30.0–36.0)
MCV: 87.8 fL (ref 78.0–100.0)
MONO ABS: 0.6 10*3/uL (ref 0.1–1.0)
MONOS PCT: 9 % (ref 3–12)
NEUTROS ABS: 4.3 10*3/uL (ref 1.7–7.7)
Neutrophils Relative %: 63 % (ref 43–77)
Platelets: 324 10*3/uL (ref 150–400)
RBC: 4.66 MIL/uL (ref 3.87–5.11)
RDW: 12.5 % (ref 11.5–15.5)
WBC: 6.8 10*3/uL (ref 4.0–10.5)

## 2013-08-19 LAB — COMPREHENSIVE METABOLIC PANEL
ALT: 27 U/L (ref 0–35)
AST: 16 U/L (ref 0–37)
Albumin: 4.1 g/dL (ref 3.5–5.2)
Alkaline Phosphatase: 69 U/L (ref 39–117)
BUN: 13 mg/dL (ref 6–23)
CO2: 26 mEq/L (ref 19–32)
Calcium: 9.4 mg/dL (ref 8.4–10.5)
Chloride: 101 mEq/L (ref 96–112)
Creatinine, Ser: 0.8 mg/dL (ref 0.50–1.10)
GFR calc Af Amer: >90 mL/min (ref >90)
GFR calc non Af Amer: >90 mL/min (ref >90)
Glucose, Bld: 139 mg/dL — ABNORMAL HIGH (ref 70–99)
Potassium: 3.6 mEq/L — ABNORMAL LOW (ref 3.7–5.3)
Sodium: 142 mEq/L (ref 137–147)
Total Bilirubin: 0.5 mg/dL (ref 0.3–1.2)
Total Protein: 7.9 g/dL (ref 6.0–8.3)

## 2013-08-19 LAB — GLUCOSE, CAPILLARY: Glucose-Capillary: 134 mg/dL — ABNORMAL HIGH (ref 70–99)

## 2013-08-19 MED ORDER — ONDANSETRON HCL 4 MG/2ML IJ SOLN
4.0000 mg | Freq: Once | INTRAMUSCULAR | Status: AC
  Administered 2013-08-20: 4 mg via INTRAVENOUS
  Filled 2013-08-19: qty 2

## 2013-08-19 NOTE — ED Notes (Signed)
Presents with 24 hours of nausea, vomiting and diarrhea, having loose stools a few times every hour, unable to hold down fluids. Pt is diabetic, CBG 134. C/o abdominal discomfort and rectal discomfort.

## 2013-08-20 ENCOUNTER — Emergency Department (HOSPITAL_COMMUNITY)
Admission: EM | Admit: 2013-08-20 | Discharge: 2013-08-20 | Disposition: A | Discharge status: Home / Self Care | Payer: BC Managed Care – PPO | Attending: Emergency Medicine | Admitting: Emergency Medicine

## 2013-08-20 DIAGNOSIS — R110 Nausea: Secondary | ICD-10-CM

## 2013-08-20 DIAGNOSIS — R197 Diarrhea, unspecified: Secondary | ICD-10-CM

## 2013-08-20 DIAGNOSIS — E860 Dehydration: Secondary | ICD-10-CM

## 2013-08-20 LAB — POCT PREGNANCY, URINE: PREG TEST UR: NEGATIVE

## 2013-08-20 MED ORDER — SODIUM CHLORIDE 0.9 % IV SOLN: 1000.0000 mL | INTRAVENOUS | Status: DC

## 2013-08-20 MED ORDER — SODIUM CHLORIDE 0.9 % IV SOLN
1000.0000 mL | Freq: Once | INTRAVENOUS | Status: AC
Start: 2013-08-20 — End: 2013-08-20
  Administered 2013-08-20: 1000 mL via INTRAVENOUS

## 2013-08-20 MED ORDER — ONDANSETRON 8 MG PO TBDP: 8.0000 mg | ORAL_TABLET | Freq: Three times a day (TID) | ORAL | Status: DC | PRN

## 2013-08-20 MED ORDER — DOCUSATE SODIUM 100 MG PO CAPS: 100.0000 mg | ORAL_CAPSULE | Freq: Two times a day (BID) | ORAL | Status: DC

## 2013-08-20 MED ORDER — SODIUM CHLORIDE 0.9 % IV SOLN
1000.0000 mL | Freq: Once | INTRAVENOUS | Status: AC
  Administered 2013-08-20: 1000 mL via INTRAVENOUS

## 2013-08-20 MED ORDER — MORPHINE SULFATE 4 MG/ML IJ SOLN
4.0000 mg | Freq: Once | INTRAMUSCULAR | Status: AC
  Administered 2013-08-20: 4 mg via INTRAVENOUS
  Filled 2013-08-20: qty 1

## 2013-08-20 MED ORDER — HYDROCODONE-ACETAMINOPHEN 5-325 MG PO TABS: 1.0000 | ORAL_TABLET | ORAL | Status: DC | PRN

## 2013-08-20 MED ORDER — PE-SHARK LIVER OIL-COCOA BUTTR 0.25-3-85.5 % RE SUPP: 1.0000 | RECTAL | Status: DC | PRN

## 2013-08-20 NOTE — ED Notes (Signed)
MD at bedside. 

## 2013-08-20 NOTE — ED Notes (Signed)
Pt A&Ox4, ambulatory at discharge, verbalizing no complaints at this time. 

## 2013-08-20 NOTE — ED Provider Notes (Signed)
CSN: [id number]     Arrival date & time 08/19/13  2245 History   First MD Initiated Contact with Patient 08/20/13 0109     Chief Complaint  Patient presents with  . Diarrhea   HPI Patient reports nausea vomiting diarrhea over the past 24 hours.  Decreased oral intake.  Patient is a type I diabetic her blood sugars continue to be controlled.  Patient with recent sick contacts.  No fevers or chills.  No melena or hematochezia.  No hematemesis.  Symptoms are mild to moderate in severity.  She states she feels generally weak   Past Medical History  Diagnosis Date  . Diabetes mellitus   . Neuropathy 07/2012  . Chronic painful diabetic neuropathy    Past Surgical History  Procedure Laterality Date  . Cesarean section    . Cesarean section    . Dilation and curettage of uterus     Family History  Problem Relation Age of Onset  . Hypertension Mother    History  Substance Use Topics  . Smoking status: Never Smoker   . Smokeless tobacco: Never Used  . Alcohol Use: No   OB History   Grav Para Term Preterm Abortions TAB SAB Ect Mult Living   4 2 2  2 2    2      Review of Systems  All other systems reviewed and are negative.    Allergies  Pramipexole  Home Medications   Current Outpatient Rx  Name  Route  Sig  Dispense  Refill  . acetaminophen (TYLENOL) 325 MG tablet   Oral   Take 650 mg by mouth every 6 (six) hours as needed for fever.         Marland Kitchen ibuprofen (ADVIL,MOTRIN) 200 MG tablet   Oral   Take 400 mg by mouth every 6 (six) hours as needed for pain.         Marland Kitchen insulin aspart (NOVOLOG) 100 UNIT/ML injection   Subcutaneous   Inject 10-20 Units into the skin 3 (three) times daily with meals.         . pregabalin (LYRICA) 75 MG capsule   Oral   Take 150 mg by mouth 2 (two) times daily.          Marland Kitchen glucose blood test strip   Other   6 each by Other route daily.         . ondansetron (ZOFRAN ODT) 8 MG disintegrating tablet   Oral   Take 1 tablet (8 mg  total) by mouth every 8 (eight) hours as needed for nausea or vomiting.   10 tablet   0    BP 105/70  Pulse 112  Temp(Src) 98.3 F (36.8 C) (Oral)  Resp 16  SpO2 100% Physical Exam  Nursing note and vitals reviewed. Constitutional: She is oriented to person, place, and time. She appears well-developed and well-nourished. No distress.  HENT:  Head: Normocephalic and atraumatic.  Dry mucous membranes  Eyes: EOM are normal.  Neck: Normal range of motion.  Cardiovascular: Normal rate, regular rhythm and normal heart sounds.   Pulmonary/Chest: Effort normal and breath sounds normal.  Abdominal: Soft. She exhibits no distension. There is no tenderness.  Musculoskeletal: Normal range of motion.  Neurological: She is alert and oriented to person, place, and time.  Skin: Skin is warm and dry.  Psychiatric: She has a normal mood and affect. Judgment normal.    ED Course  Procedures (including critical care time) Labs Review Labs Reviewed  COMPREHENSIVE METABOLIC PANEL - Abnormal; Notable for the following:    Potassium 3.6 (*)    Glucose, Bld 139 (*)    All other components within normal limits  GLUCOSE, CAPILLARY - Abnormal; Notable for the following:    Glucose-Capillary 134 (*)    All other components within normal limits  CBC WITH DIFFERENTIAL  POCT PREGNANCY, URINE   Imaging Review No results found.  EKG Interpretation   None       MDM   1. Diarrhea   2. Nausea   3. Dehydration    4:35 AM Patient feels much better after IV fluids.  Discharge home in good condition.  Home with nausea medicine.    Hoy Morn, MD 08/20/13 [phone]

## 2013-12-29 ENCOUNTER — Encounter (HOSPITAL_COMMUNITY): Payer: Self-pay | Admitting: Emergency Medicine

## 2013-12-29 ENCOUNTER — Emergency Department (HOSPITAL_COMMUNITY)
Admission: EM | Admit: 2013-12-29 | Discharge: 2013-12-30 | Disposition: A | Discharge status: Home / Self Care | Payer: Medicare Other | Attending: Emergency Medicine | Admitting: Emergency Medicine

## 2013-12-29 ENCOUNTER — Emergency Department (INDEPENDENT_AMBULATORY_CARE_PROVIDER_SITE_OTHER)
Admission: EM | Admit: 2013-12-29 | Discharge: 2013-12-29 | Disposition: A | Discharge status: Nursing Home (Includes ICF) | Payer: Medicare Other | Source: Home / Self Care | Attending: Emergency Medicine | Admitting: Emergency Medicine

## 2013-12-29 DIAGNOSIS — B9689 Other specified bacterial agents as the cause of diseases classified elsewhere: Secondary | ICD-10-CM | POA: Insufficient documentation

## 2013-12-29 DIAGNOSIS — E1142 Type 2 diabetes mellitus with diabetic polyneuropathy: Secondary | ICD-10-CM | POA: Insufficient documentation

## 2013-12-29 DIAGNOSIS — R109 Unspecified abdominal pain: Secondary | ICD-10-CM

## 2013-12-29 DIAGNOSIS — N760 Acute vaginitis: Secondary | ICD-10-CM | POA: Insufficient documentation

## 2013-12-29 DIAGNOSIS — R110 Nausea: Secondary | ICD-10-CM | POA: Insufficient documentation

## 2013-12-29 DIAGNOSIS — E1149 Type 2 diabetes mellitus with other diabetic neurological complication: Secondary | ICD-10-CM | POA: Insufficient documentation

## 2013-12-29 DIAGNOSIS — IMO0001 Reserved for inherently not codable concepts without codable children: Secondary | ICD-10-CM

## 2013-12-29 DIAGNOSIS — E119 Type 2 diabetes mellitus without complications: Secondary | ICD-10-CM

## 2013-12-29 DIAGNOSIS — A499 Bacterial infection, unspecified: Secondary | ICD-10-CM | POA: Insufficient documentation

## 2013-12-29 DIAGNOSIS — Z794 Long term (current) use of insulin: Secondary | ICD-10-CM | POA: Insufficient documentation

## 2013-12-29 DIAGNOSIS — Z9889 Other specified postprocedural states: Secondary | ICD-10-CM | POA: Insufficient documentation

## 2013-12-29 DIAGNOSIS — N390 Urinary tract infection, site not specified: Secondary | ICD-10-CM | POA: Insufficient documentation

## 2013-12-29 DIAGNOSIS — R1030 Lower abdominal pain, unspecified: Secondary | ICD-10-CM

## 2013-12-29 DIAGNOSIS — Z3202 Encounter for pregnancy test, result negative: Secondary | ICD-10-CM | POA: Insufficient documentation

## 2013-12-29 LAB — POCT URINALYSIS DIP (DEVICE)
Bilirubin Urine: NEGATIVE
Glucose, UA: 500 mg/dL — AB
Hgb urine dipstick: NEGATIVE
KETONES UR: NEGATIVE mg/dL
Leukocytes, UA: NEGATIVE
Nitrite: NEGATIVE
PROTEIN: NEGATIVE mg/dL
Specific Gravity, Urine: >=1.03 (ref 1.005–1.030)
UROBILINOGEN UA: 0.2 mg/dL (ref 0.0–1.0)
pH: 6 (ref 5.0–8.0)

## 2013-12-29 LAB — CBC WITH DIFFERENTIAL/PLATELET
BASOS ABS: 0 10*3/uL (ref 0.0–0.1)
BASOS PCT: 0 % (ref 0–1)
Eosinophils Absolute: 0.1 10*3/uL (ref 0.0–0.7)
Eosinophils Relative: 2 % (ref 0–5)
HCT: 35 % — ABNORMAL LOW (ref 36.0–46.0)
Hemoglobin: 12.4 g/dL (ref 12.0–15.0)
Lymphocytes Relative: 40 % (ref 12–46)
Lymphs Abs: 2.5 10*3/uL (ref 0.7–4.0)
MCH: 31 pg (ref 26.0–34.0)
MCHC: 35.4 g/dL (ref 30.0–36.0)
MCV: 87.5 fL (ref 78.0–100.0)
Monocytes Absolute: 0.3 10*3/uL (ref 0.1–1.0)
Monocytes Relative: 5 % (ref 3–12)
NEUTROS ABS: 3.4 10*3/uL (ref 1.7–7.7)
NEUTROS PCT: 53 % (ref 43–77)
Platelets: 259 10*3/uL (ref 150–400)
RBC: 4 MIL/uL (ref 3.87–5.11)
RDW: 12.3 % (ref 11.5–15.5)
WBC: 6.3 10*3/uL (ref 4.0–10.5)

## 2013-12-29 LAB — COMPREHENSIVE METABOLIC PANEL
ALBUMIN: 3.6 g/dL (ref 3.5–5.2)
ALK PHOS: 82 U/L (ref 39–117)
ALT: 32 U/L (ref 0–35)
AST: 16 U/L (ref 0–37)
BUN: 9 mg/dL (ref 6–23)
CHLORIDE: 99 meq/L (ref 96–112)
CO2: 27 mEq/L (ref 19–32)
Calcium: 9.5 mg/dL (ref 8.4–10.5)
Creatinine, Ser: 0.58 mg/dL (ref 0.50–1.10)
GFR calc Af Amer: >90 mL/min (ref >90)
GFR calc non Af Amer: >90 mL/min (ref >90)
Glucose, Bld: 231 mg/dL — ABNORMAL HIGH (ref 70–99)
POTASSIUM: 4.1 meq/L (ref 3.7–5.3)
Sodium: 137 mEq/L (ref 137–147)
Total Protein: 7.1 g/dL (ref 6.0–8.3)

## 2013-12-29 LAB — POC URINE PREG, ED: PREG TEST UR: NEGATIVE

## 2013-12-29 LAB — POCT PREGNANCY, URINE: PREG TEST UR: NEGATIVE

## 2013-12-29 LAB — LIPASE, BLOOD: Lipase: 21 U/L (ref 11–59)

## 2013-12-29 MED ORDER — SODIUM CHLORIDE 0.9 % IV BOLUS (SEPSIS)
1000.0000 mL | Freq: Once | INTRAVENOUS | Status: AC
  Administered 2013-12-30: 1000 mL via INTRAVENOUS

## 2013-12-29 MED ORDER — ONDANSETRON HCL 4 MG/2ML IJ SOLN
4.0000 mg | Freq: Once | INTRAMUSCULAR | Status: AC
Start: 2013-12-29 — End: 2013-12-30
  Administered 2013-12-30: 4 mg via INTRAVENOUS
  Filled 2013-12-29: qty 2

## 2013-12-29 MED ORDER — ONDANSETRON 4 MG PO TBDP
8.0000 mg | ORAL_TABLET | Freq: Once | ORAL | Status: AC
  Administered 2013-12-29: 8 mg via ORAL
  Filled 2013-12-29: qty 2

## 2013-12-29 NOTE — ED Provider Notes (Signed)
CSN: [id number]     Arrival date & time 12/29/13  1632 History   First MD Initiated Contact with Patient 12/29/13 1833     Chief Complaint  Patient presents with  . Abdominal Pain   (Consider location/radiation/quality/duration/timing/severity/associated sxs/prior Treatment) HPI Comments: Pt with hx poorly controlled type I diabetes. Does not currently have doctor and manages dm on her own. Severe abd pain for a week. 2 episodes loose stool on 5/9, normal stool since. Nausea, no vomiting. Hx same abd pain every time she gets her period "for years but no one can tell me what it is". Lmp 5/4, period is over now, pt still has pain which is why she came to get it checked.   Patient is a 28 y.o. female presenting with abdominal pain. The history is provided by the patient.  Abdominal Pain Pain location:  Generalized Pain quality: aching, sharp and stabbing   Pain radiates to:  Back Pain severity:  Severe Onset quality:  Gradual Duration:  8 days Timing:  Constant Progression:  Unchanged Chronicity:  Recurrent Relieved by:  Nothing Worsened by:  Nothing tried Ineffective treatments:  NSAIDs Associated symptoms: diarrhea and nausea   Associated symptoms: no chills, no constipation, no dysuria, no fever, no vaginal discharge and no vomiting     Past Medical History  Diagnosis Date  . Diabetes mellitus   . Neuropathy 07/2012  . Chronic painful diabetic neuropathy    Past Surgical History  Procedure Laterality Date  . Cesarean section    . Cesarean section    . Dilation and curettage of uterus     Family History  Problem Relation Age of Onset  . Hypertension Mother    History  Substance Use Topics  . Smoking status: Never Smoker   . Smokeless tobacco: Never Used  . Alcohol Use: No   OB History   Grav Para Term Preterm Abortions TAB SAB Ect Mult Living   4 2 2  2 2    2      Review of Systems  Constitutional: Negative for fever and chills.  Gastrointestinal: Positive for  nausea, abdominal pain and diarrhea. Negative for vomiting and constipation.  Genitourinary: Negative for dysuria and vaginal discharge.    Allergies  Pramipexole  Home Medications   Prior to Admission medications   Medication Sig Start Date End Date Taking? Authorizing Provider  insulin aspart (NOVOLOG) 100 UNIT/ML injection Inject 10-20 Units into the skin 3 (three) times daily with meals.   Yes Historical Provider, MD  acetaminophen (TYLENOL) 325 MG tablet Take 650 mg by mouth every 6 (six) hours as needed for fever.    Historical Provider, MD  docusate sodium (COLACE) 100 MG capsule Take 1 capsule (100 mg total) by mouth every 12 (twelve) hours. 08/20/13   Hoy Morn, MD  glucose blood test strip 6 each by Other route daily. 03/11/13   Philemon Kingdom, MD  HYDROcodone-acetaminophen (NORCO/VICODIN) 5-325 MG per tablet Take 1 tablet by mouth every 4 (four) hours as needed for moderate pain. 08/20/13   Hoy Morn, MD  ibuprofen (ADVIL,MOTRIN) 200 MG tablet Take 400 mg by mouth every 6 (six) hours as needed for pain.    Historical Provider, MD  ondansetron (ZOFRAN ODT) 8 MG disintegrating tablet Take 1 tablet (8 mg total) by mouth every 8 (eight) hours as needed for nausea or vomiting. 08/20/13   Hoy Morn, MD  pregabalin (LYRICA) 75 MG capsule Take 150 mg by mouth 2 (two) times daily.  Historical Provider, MD  shark liver oil-cocoa butter (PREPARATION H) 0.25-3-85.5 % suppository Place 1 suppository rectally as needed for hemorrhoids. 08/20/13   Hoy Morn, MD   BP 107/77  Pulse 68  Temp(Src) 98.7 F (37.1 C) (Oral)  Resp 16  SpO2 100%  LMP 12/20/2013 Physical Exam  Constitutional: She appears well-developed and well-nourished.  Appears in pain  Cardiovascular: Normal rate and regular rhythm.   Pulmonary/Chest: Effort normal and breath sounds normal.  Abdominal: Soft. Bowel sounds are normal. She exhibits no distension. There is generalized tenderness. There is no  rigidity, no rebound and no guarding.    ED Course  Procedures (including critical care time) Labs Review Labs Reviewed  POCT URINALYSIS DIP (DEVICE) - Abnormal; Notable for the following:    Glucose, UA 500 (*)    All other components within normal limits  POCT PREGNANCY, URINE    Imaging Review No results found.   MDM   1. Abdominal pain   given pt's pain is severe and persistent, poorly controlled type I diabetes with no doctor, transfer to ER for further eval.     Carvel Getting, NP 12/29/13 1839

## 2013-12-29 NOTE — ED Provider Notes (Addendum)
CSN: [id number]     Arrival date & time 12/29/13  1846 History   First MD Initiated Contact with Patient 12/29/13 2258     Chief Complaint  Patient presents with  . Abdominal Pain     (Consider location/radiation/quality/duration/timing/severity/associated sxs/prior Treatment) Patient is a 28 y.o. female presenting with abdominal pain. The history is provided by the patient and medical records. No language interpreter was used.  Abdominal Pain Associated symptoms: nausea   Associated symptoms: no chest pain, no constipation, no cough, no diarrhea, no dysuria, no fatigue, no fever, no hematuria, no shortness of breath and no vomiting     Carmell L McAdoo-collier is a 28 y.o. female  with a hx of IDDM presents to the Emergency Department complaining of gradual, persistent, progressively worsening lower abd pain onset 8 days ago but acutely worsening today.  Pt reports lower abd pain that radiates to her back.  Pt rates pain as 10/10 and describes the pain as cramping pain that is constant.  She has tried OTC medications without relief.  Associated symptoms include loose stools 4 days ago which have resolved; nausea without emesis.  Pt reports that nothing makes it better and movement makes it worse.  Pt denies fever, chills, headache, neck pain, chest pain, SOB, vomiting, weakness, dizziness, syncope, dysuria.  Pt reports 1 sexual partner in the last year, no hx of STD.  LMP:  Dec 20, 2013.  Pt reports symptoms are present every month during and after her menstrual cycle.  Pt reports her last OB/GYN appointment was approx 1 year ago.     Past Medical History  Diagnosis Date  . Diabetes mellitus   . Neuropathy 07/2012  . Chronic painful diabetic neuropathy    Past Surgical History  Procedure Laterality Date  . Cesarean section    . Cesarean section    . Dilation and curettage of uterus     Family History  Problem Relation Age of Onset  . Hypertension Mother    History  Substance Use  Topics  . Smoking status: Never Smoker   . Smokeless tobacco: Never Used  . Alcohol Use: No   OB History   Grav Para Term Preterm Abortions TAB SAB Ect Mult Living   4 2 2  2 2    2      Review of Systems  Constitutional: Negative for fever, diaphoresis, appetite change, fatigue and unexpected weight change.  HENT: Negative for mouth sores and trouble swallowing.   Eyes: Negative for visual disturbance.  Respiratory: Negative for cough, chest tightness, shortness of breath, wheezing and stridor.   Cardiovascular: Negative for chest pain and palpitations.  Gastrointestinal: Positive for nausea and abdominal pain. Negative for vomiting, diarrhea, constipation, blood in stool, abdominal distention and rectal pain.  Endocrine: Negative for polydipsia, polyphagia and polyuria.  Genitourinary: Negative for dysuria, urgency, frequency, hematuria, flank pain and difficulty urinating.  Musculoskeletal: Negative for back pain, neck pain and neck stiffness.  Skin: Negative for rash.  Allergic/Immunologic: Negative for immunocompromised state.  Neurological: Negative for syncope, weakness, light-headedness and headaches.  Hematological: Negative for adenopathy. Does not bruise/bleed easily.  Psychiatric/Behavioral: Negative for confusion and sleep disturbance. The patient is not nervous/anxious.   All other systems reviewed and are negative.     Allergies  Pramipexole  Home Medications   Prior to Admission medications   Medication Sig Start Date End Date Taking? Authorizing Provider  acetaminophen (TYLENOL) 325 MG tablet Take 650 mg by mouth every 6 (six) hours as  needed for fever.   Yes Historical Provider, MD  ibuprofen (ADVIL,MOTRIN) 200 MG tablet Take 400 mg by mouth every 6 (six) hours as needed for pain.   Yes Historical Provider, MD  insulin aspart (NOVOLOG) 100 UNIT/ML injection Inject 10-20 Units into the skin 3 (three) times daily with meals.   Yes Historical Provider, MD   ondansetron (ZOFRAN ODT) 8 MG disintegrating tablet Take 1 tablet (8 mg total) by mouth every 8 (eight) hours as needed for nausea or vomiting. 08/20/13  Yes Hoy Morn, MD   BP 113/84  Pulse 76  Temp(Src) 99.1 F (37.3 C) (Oral)  Resp 22  SpO2 97%  LMP 12/20/2013 Physical Exam  Nursing note and vitals reviewed. Constitutional: She is oriented to person, place, and time. She appears well-developed and well-nourished. No distress.  Awake, alert, nontoxic appearance  HENT:  Head: Normocephalic and atraumatic.  Mouth/Throat: Oropharynx is clear and moist. No oropharyngeal exudate.  Eyes: Conjunctivae are normal. No scleral icterus.  Neck: Normal range of motion. Neck supple.  Cardiovascular: Normal rate, regular rhythm, normal heart sounds and intact distal pulses.   No murmur heard. Pulmonary/Chest: Effort normal and breath sounds normal. No respiratory distress. She has no wheezes.  Abdominal: Soft. Bowel sounds are normal. She exhibits no distension and no mass. There is tenderness in the suprapubic area. There is no rebound, no guarding and no CVA tenderness. Hernia confirmed negative in the right inguinal area and confirmed negative in the left inguinal area.  Her pubic abdominal tenderness No CVA tenderness No rigidity, rebound or guarding No peritoneal signs  Genitourinary: Uterus normal. There is no rash, tenderness, lesion or injury on the right labia. There is no rash, tenderness, lesion or injury on the left labia. Uterus is not deviated, not enlarged, not fixed and not tender. Cervix exhibits no motion tenderness, no discharge and no friability. Right adnexum displays no mass, no tenderness and no fullness. Left adnexum displays no mass, no tenderness and no fullness. No erythema, tenderness or bleeding around the vagina. No foreign body around the vagina. No signs of injury around the vagina. Vaginal discharge ( moderate, thick, white) found.  Musculoskeletal: Normal range of  motion. She exhibits no edema.  Lymphadenopathy:       Right: No inguinal adenopathy present.       Left: No inguinal adenopathy present.  Neurological: She is alert and oriented to person, place, and time. Coordination normal.  Speech is clear and goal oriented Moves extremities without ataxia  Skin: Skin is warm and dry. She is not diaphoretic. No erythema.  Psychiatric: She has a normal mood and affect.    ED Course  Procedures (including critical care time) Labs Review Labs Reviewed  WET PREP, GENITAL - Abnormal; Notable for the following:    Clue Cells Wet Prep HPF POC MANY (*)    WBC, Wet Prep HPF POC FEW (*)    All other components within normal limits  CBC WITH DIFFERENTIAL - Abnormal; Notable for the following:    HCT 35.0 (*)    All other components within normal limits  COMPREHENSIVE METABOLIC PANEL - Abnormal; Notable for the following:    Glucose, Bld 231 (*)    Total Bilirubin <0.2 (*)    All other components within normal limits  URINALYSIS, ROUTINE W REFLEX MICROSCOPIC - Abnormal; Notable for the following:    APPearance CLOUDY (*)    Glucose, UA >1000 (*)    Leukocytes, UA MODERATE (*)  All other components within normal limits  URINE MICROSCOPIC-ADD ON - Abnormal; Notable for the following:    Squamous Epithelial / LPF MANY (*)    Bacteria, UA MANY (*)    All other components within normal limits  GC/CHLAMYDIA PROBE AMP  URINE CULTURE  LIPASE, BLOOD  HIV ANTIBODY (ROUTINE TESTING)  POC URINE PREG, ED    Imaging Review No results found.   EKG Interpretation None      MDM   Final diagnoses:  Lower abdominal pain  UTI (lower urinary tract infection)  IDDM (insulin dependent diabetes mellitus)  BV (bacterial vaginosis)   Alia L McAdoo-collier presents with lower abdominal pain for greater than one week and mild suprapubic abdominal tenderness on exam. Patient reports same pain every month for more than a year. She reports she's been told  that she has endometriosis but has been unable to followup with the GYN or primary care.  Patient reports she presented to urgent care today and they sent her here to the emergency room. Patient endorses nausea without vomiting, tolerating by mouth here in the department without difficulty.  Patient with mild hyperglycemia but anion gap is 11.  No evidence of ketosis on UA.  12:30PM Pelvic exam without cervical motion tenderness or adnexal tenderness. Wet prep with many clue cells. Pregnancy test negative. UA with evidence of urinary tract infection, will send culture. CBC and CMP were reassuring.  No evidence of PID.  Patient is afebrile, non-tachycardic and nontoxic appearing.  Highly doubt abdominal abscess, appendicitis, diverticulitis, tubo-ovarian abscess.    1:57 AM Patient feeling much better.  Patient given Rocephin IV for her urinary tract infection. Percocet by mouth handle her pain. I have discussed with her that she should followup with women's outpatient clinic for possible ultrasound if symptoms persist. She is to return to the emergency department for high fevers, intractable vomiting or other concerning symptoms.  It has been determined that no acute conditions requiring further emergency intervention are present at this time. The patient/guardian have been advised of the diagnosis and plan. We have discussed signs and symptoms that warrant return to the ED, such as changes or worsening in symptoms.   Vital signs are stable at discharge.   BP 113/84  Pulse 76  Temp(Src) 99.1 F (37.3 C) (Oral)  Resp 22  SpO2 97%  LMP 12/20/2013  Patient/guardian has voiced understanding and agreed to follow-up with the PCP or specialist.      Abigail Butts, PA-C 12/30/13 0159  Abigail Butts, PA-C 12/30/13 [id number]

## 2013-12-29 NOTE — ED Notes (Signed)
Pt c/o "severe" constant abd pain onset x1 week Reports diarrhea on Saturday; pain radiates to the back  Pain increases w/pressure.  Denies f/v/n, urinary sx, vag d/c

## 2013-12-29 NOTE — ED Notes (Signed)
Pt reports lower abd pain and lower back pain X1week.  Pt reports some nausea and diarrhea on Saturday night only.

## 2013-12-30 LAB — URINALYSIS, ROUTINE W REFLEX MICROSCOPIC
Bilirubin Urine: NEGATIVE
Glucose, UA: >1000 mg/dL — AB
Hgb urine dipstick: NEGATIVE
Ketones, ur: NEGATIVE mg/dL
Nitrite: NEGATIVE
PH: 5.5 (ref 5.0–8.0)
PROTEIN: NEGATIVE mg/dL
Specific Gravity, Urine: 1.025 (ref 1.005–1.030)
Urobilinogen, UA: 1 mg/dL (ref 0.0–1.0)

## 2013-12-30 LAB — WET PREP, GENITAL
TRICH WET PREP: NONE SEEN
Yeast Wet Prep HPF POC: NONE SEEN

## 2013-12-30 LAB — URINE MICROSCOPIC-ADD ON

## 2013-12-30 LAB — GC/CHLAMYDIA PROBE AMP
CT Probe RNA: NEGATIVE
GC Probe RNA: NEGATIVE

## 2013-12-30 LAB — HIV ANTIBODY (ROUTINE TESTING W REFLEX): HIV: NONREACTIVE

## 2013-12-30 MED ORDER — OXYCODONE-ACETAMINOPHEN 5-325 MG PO TABS
2.0000 | ORAL_TABLET | Freq: Once | ORAL | Status: AC
  Administered 2013-12-30: 2 via ORAL
  Filled 2013-12-30: qty 2

## 2013-12-30 MED ORDER — OXYCODONE-ACETAMINOPHEN 5-325 MG PO TABS: 2.0000 | ORAL_TABLET | Freq: Four times a day (QID) | ORAL | Status: DC | PRN

## 2013-12-30 MED ORDER — DEXTROSE 5 % IV SOLN
1.0000 g | Freq: Once | INTRAVENOUS | Status: AC
  Administered 2013-12-30: 1 g via INTRAVENOUS
  Filled 2013-12-30: qty 10

## 2013-12-30 MED ORDER — CEPHALEXIN 500 MG PO CAPS: 500.0000 mg | ORAL_CAPSULE | Freq: Four times a day (QID) | ORAL | Status: DC

## 2013-12-30 MED ORDER — METRONIDAZOLE 500 MG PO TABS: 500.0000 mg | ORAL_TABLET | Freq: Two times a day (BID) | ORAL | Status: DC

## 2013-12-30 NOTE — ED Provider Notes (Signed)
Medical screening examination/treatment/procedure(s) were performed by non-physician practitioner and as supervising physician I was immediately available for consultation/collaboration.   EKG Interpretation None       Kalman Drape, MD 12/30/13 [phone]

## 2013-12-30 NOTE — ED Provider Notes (Signed)
Medical screening examination/treatment/procedure(s) were performed by non-physician practitioner and as supervising physician I was immediately available for consultation/collaboration.   EKG Interpretation None       Kalman Drape, MD 12/30/13 2131

## 2013-12-30 NOTE — Discharge Instructions (Signed)
1. Medications: Keflex, Percocet, Flagyl, usual home medications 2. Treatment: rest, drink plenty of fluids,  3. Follow Up: Please followup with women's OB/GYN for further evaluation of your lower abdominal pain within 2 days, return to the emergency department for high fevers, intractable vomiting or worsening pain; if you do not have a primary care physician please use the resource guide to find one   Endometriosis Endometriosis is a condition in which the tissue that lines the uterus (endometrium) grows outside of its normal location. The tissue may grow in many locations close to the uterus, but it commonly grows on the ovaries, fallopian tubes, vagina, or bowel. Because the uterus expels, or sheds, its lining every menstrual cycle, there is bleeding wherever the endometrial tissue is located. This can cause pain because blood is irritating to tissues not normally exposed to it.  CAUSES  The cause of endometriosis is not known.  SIGNS AND SYMPTOMS  Often, there are no symptoms. When symptoms are present, they can vary with the location of the displaced tissue. Various symptoms can occur at different times. Although symptoms occur mainly during a woman's menstrual period, they can also occur midcycle and usually stop with menopause. Some people may go months with no symptoms at all. Symptoms may include:   Back or abdominal pain.   Heavier bleeding during periods.   Pain during intercourse.   Painful bowel movements.   Infertility. DIAGNOSIS  Your health care provider will do a physical exam and ask about your symptoms. Various tests may be done, such as:   Blood tests and urine tests. These are done to help rule out other problems.   Ultrasound. This test is done to look for abnormal tissue.   An X-ray of the lower bowel (barium enema).  Laparoscopy. In this procedure, a thin, lighted tube with a tiny camera on the end (laparoscope) is inserted into your abdomen. This helps  your health care provider look for abnormal tissue to confirm the diagnosis. The health care provider may also remove a small piece of tissue (biopsy) from any abnormal tissue found. This tissue sample can then be sent to a lab so it can be looked at under a microscope. TREATMENT  Treatment will vary and may include:   Medicines to relieve pain. Nonsteroidal anti-inflammatory drugs (NSAIDs) are a type of pain medicine that can help to relieve the pain caused by endometriosis.  Hormonal therapy. When using hormonal therapy, periods are eliminated. This eliminates the monthly exposure to blood by the displaced endometrial tissue.   Surgery. Surgery may sometimes be done to remove the abnormal endometrial tissue. In severe cases, surgery may be done to remove the fallopian tubes, uterus, and ovaries (hysterectomy). HOME CARE INSTRUCTIONS   Only take over-the-counter or prescription medicines for pain, discomfort, or fever as directed by your health care provider. Do not take aspirin because it may increase bleeding when you are not on hormonal therapy.   Avoid activities that produce pain, including sexual activity. SEEK MEDICAL CARE IF:  You have pelvic pain before, after, or during your periods.  You have pelvic pain between periods that gets worse during your period.  You have pelvic pain during or after sex.  You have pelvic pain with bowel movements or urination, especially during your period.  You have problems getting pregnant. SEEK IMMEDIATE MEDICAL CARE IF:   Your pain is severe and is not responding to pain medicine.   You have severe nausea and vomiting, or you cannot keep  foods down.   You have pain that is limited to the right lower part of your abdomen.   You have swelling or increasing pain in your abdomen.   You see blood in your stool.   You have a fever or persistent symptoms for more than 2 3 days.   You have a fever and your symptoms suddenly get  worse. MAKE SURE YOU:   Understand these instructions.  Will watch your condition.  Will get help right away if you are not doing well or get worse. Document Released: 08/02/2000 Document Revised: 05/26/2013 Document Reviewed: 04/02/2013 [facility] Patient Information 2014 [place].    Emergency Department Resource Guide 1) Find a Doctor and Pay Out of Pocket Although you won't have to find out who is covered by your insurance plan, it is a good idea to ask around and get recommendations. You will then need to call the office and see if the doctor you have chosen will accept you as a new patient and what types of options they offer for patients who are self-pay. Some doctors offer discounts or will set up payment plans for their patients who do not have insurance, but you will need to ask so you aren't surprised when you get to your appointment.  2) Contact Your Local Health Department Not all health departments have doctors that can see patients for sick visits, but many do, so it is worth a call to see if yours does. If you don't know where your local health department is, you can check in your phone book. The CDC also has a tool to help you locate your state's health department, and many state websites also have listings of all of their local health departments.  3) Find a [facility] If your illness is not likely to be very severe or complicated, you may want to try a walk in clinic. These are popping up all over the country in pharmacies, drugstores, and shopping centers. They're usually staffed by nurse practitioners or physician assistants that have been trained to treat common illnesses and complaints. They're usually fairly quick and inexpensive. However, if you have serious medical issues or chronic medical problems, these are probably not your best option.  No Primary Care Doctor: - Call Health Connect at  [phone] - they can help you locate a primary care doctor that   accepts your insurance, provides certain services, etc. - Physician Referral Service- [phone]  Chronic Pain Problems: Organization         Address  Phone   Notes  [facility]  [phone] Patients need to be referred by their primary care doctor.   Medication Assistance: Organization         Address  Phone   Notes  [facility] Medication [facility] [address] [place], [place] [address] [phone] --Must be a resident of [facility] -- Must have NO insurance coverage whatsoever (no Medicaid/ Medicare, etc.) -- The pt. MUST have a primary care doctor that directs their care regularly and follows them in the community   MedAssist  [phone]   [employer]  [phone]    Agencies that provide inexpensive medical care: Organization         Address  Phone   Notes  [place]  [phone]   [facility]    [phone]   [facility] [place], [place] [address] [phone]  [place] [address] [place] [phone]   Planned Parenthood    [phone]   [facility]    [phone]   [place] and [address], [place] Phone:  [phone], Fax:  [phone] Hours of Operation:  9 am - 6 pm, M-F.  Also accepts Medicaid/Medicare and self-pay.  [place] for [place], [address], [place] Phone: [phone], Fax: [phone]. Hours of Operation:  8:30 am - 5:30 pm, M-F.  Also accepts Medicaid and self-pay.  [facility] [place] [address] [place] Phone: [phone]   [place], [place], [place] [phone], Ext. [phone] Mondays & Thursdays: 7-9 AM.  First 15 patients are seen on a first come, first serve basis.    [place] Providers:  Organization          Address  Phone   Notes  [facility] [address], [place] [phone] Also accepts self-pay patients.  [facility] [address], [place]  [phone]   [place], [address], [place] [phone]   [facility] [address] [place] [phone]   Lucianne Lei [address], [place]   [phone] Only accepts [place] Access [place] patients after they have their name applied to their card.   Self-Pay (no insurance) in [facility]:  Organization         Address  Phone   Notes  Sickle Cell Patients, [facility] [place] [phone]   [facility]gent [facility] [place] [phone]   [facility] [place]  [place], [place], [place] [phone]   [facility]/Dr. Osei-Bonsu  [address], [place] or [address], [place] [phone] Phone number for both [place] and [place] locations is the same.  Ur[facility] and [facility] [address] [place] [phone]   [facility] [address], [place] or [address] [phone]   [facility] [address] [place] [phone], phone; [phone], fax Sees patients 1st and 3rd Saturday of every month.  Must not qualify for public or private insurance (i.e. Medicaid, Medicare, [place] Health Choice, Veterans' Benefits)  Household income should be no more than 200% of the poverty level The clinic cannot treat you if you are pregnant or think you are pregnant  Sexually transmitted diseases are not treated at the clinic.    Dental Care: Organization         Address  Phone  Notes  [facility] Department of [facility] [place] [phone] Accepts children up to age 56 who are enrolled in [place] or [place]; pregnant women with a Medicaid card; and children who have applied for Medicaid or [place] Health Choice, but were declined, whose parents can pay a reduced fee at time of service.  [facility] Department of [facility]  [address], [place] [phone] Accepts children up to age 44 who are enrolled in [place] or [place]; pregnant women with a  Medicaid card; and children who have applied for Medicaid or [place] Health Choice, but were declined, whose parents can pay a reduced fee at time of service.  [place] Adult [facility] Access PROGRAM  [place] [phone] Patients are seen by appointment only. Walk-ins are not accepted. [place] will see patients 63 years of age and older. Monday - Tuesday (8am-5pm) Most Wednesdays (8:30-5pm) $30 per visit, cash only  [facility] Adult Dental Access PROGRAM  [address], [facility] [phone] Patients are seen by appointment only. Walk-ins are not accepted. [place] will see patients 72 years of age and older. One Wednesday Evening (Monthly: Volunteer Based).  $30 per visit, cash only  [place]  [phone] for adults; Children under age 25, call Graduate Pediatric Dentistry at [phone]. Children aged 72-14, please call [phone] to request a pediatric application.  Dental services are provided in all areas of dental care including fillings, crowns and bridges, complete and partial dentures, implants, gum treatment, root canals, and extractions. Preventive care is also provided. Treatment is provided to both adults and children. Patients are selected via a lottery and there is often a waiting list.   [facility] [address] [place]  [phone] [email or web address]   [facility] [address] [place], [place]  [phone], Ext. [phone] Second and Fourth Thursday of each month, opens at 6:30 AM; Clinic ends at 9 AM.  Patients are seen on a first-come first-served basis, and a limited number are seen during each clinic.   [facility]  [address] [place], [place] [phone]   Eligibility Requirements You must have lived in [place], [place], or [place] counties for at least the last three months.   You cannot be eligible for state or federal sponsored [employer], including [employer], [place], or [employer].   You generally cannot be eligible for healthcare insurance through your employer.    How to apply: Eligibility screenings are held every Tuesday and Wednesday afternoon from 1:00 pm until 4:00 pm. You do not need an appointment for the interview!  [facility] [address], [place], [place]   [place]  [place] Department  [place]  [phone]    Behavioral Health Resources in the Community: Intensive Outpatient Programs Organization         Address  Phone  Notes  [place]. [address] [place], [place] [phone]   [facility] Outpatient [address] [place], [place]   ADS: Alcohol & Drug Svcs [address] [place], [place]   [place] [address]  [place], [place] or [phone]   Substance Abuse Resources Organization         Address  Phone  Notes  Alcohol and Drug Services  [phone]   [place]  [phone]   [facility] [place]   Chinita Pester  [phone]   Residential & Outpatient Substance Abuse Program  [phone]   Psychological Services Organization         Address  Phone  Notes  [facility]  [place]  [phone]    [place] [address], [place] or [phone]    Mobile Crisis Teams Organization  Address  Phone  Notes  [facility], Mobile Crisis Care Unit  [phone]   [facility] Psychotherapeutic Services  [address] [place], [place]   [facility] [address], [place] [phone]    Self-Help/Support Groups Organization         Address  Phone             Notes  [place]. of [place] - variety of support groups  [place] Call for more information  Narcotics Anonymous (NA), [facility] [address], [employer]   2 meetings at this location   Special educational needs teacher         Address  Phone  Notes  ASAP Residential Treatment [place],    [place]  [phone]   [facility]  [address] [place] [id number], [place], [place]   [place], [place] [phone] Admissions: 8am-3pm M-F  Incentives Substance [place] [address]    [place], [place] [phone]   [facility] [address] [place] [address] [place], [place], [place]   [facility] [address]  [place], [place]   [facility] Programs - Intensive Outpatient [address], [place], [place]   [facility] ([place].) [place].,  [place], [place] [phone] or [phone]   [facility] (RTS) [address], [place], [place] Accepts Medicaid  [facility] [place] [address],  [place] [phone] Substance Abuse/Addiction Treatment   [facility] Organization         Address  Phone  Notes  [facility]  [phone]   Domenic Schwab, PhD [address] Arlis Porta [place], [place]   [phone] or [phone]   [place]  [place], [place] [phone]   [facility] [address], [place], [place] [phone] Insurance/Medicaid/sponsorship through [facility] and Families [address], [address] [place]                                    [place], [place] [phone] [place] [address], [place] [phone]    Dr. Adele Schilder  [phone]   [facility] Dept. 1) [address], [place] 2) [place] 3)  [place] [address], [place] [phone]  [phone]   [place] [phone] or [phone] (After Hours)

## 2013-12-31 LAB — URINE CULTURE

## 2014-01-01 NOTE — ED Provider Notes (Signed)
Medical screening examination/treatment/procedure(s) were performed by non-physician practitioner and as supervising physician I was immediately available for consultation/collaboration.  Philipp Deputy, M.D.  Harden Mo, MD 01/01/14 [phone]

## 2014-03-04 ENCOUNTER — Other Ambulatory Visit: Payer: Self-pay | Admitting: Obstetrics and Gynecology

## 2014-03-30 ENCOUNTER — Other Ambulatory Visit (HOSPITAL_COMMUNITY): Payer: Self-pay | Admitting: Obstetrics and Gynecology

## 2014-03-30 NOTE — H&P (Signed)
[name] is a 28 y.o. female P 2-0-2-2 for diagnostic laparoscopy because of chronic pelvic pain.  For the past 2 years the patient has experienced daily pelvic pain that she describes as sharp, dull and crampy and is made worse with menses.  Menstrual flow lasts for 5 days with pad change every 30 minutes due to the passage of clots.  She has cramping rated at 10/10 on a 10 point pain scale that decreases to 6/10 with Toradol 10 mg.  Daily pain is rated at 8/10 before analgesia. Patient goes on to report dyspareunia that is not positional,  but denies any problems with bowel or bladder function.  Six years ago she was on Implanon and was amenorrhiec but in recent years has used condoms for contraception.  A month ago she began oral contraceptives but has not completed her first pack yet and doesn't know if this will decrease her pelvic discomfort. A pelvic ultrasound in June 2015 showed: uterus 5.22 x 5.30 x 3.54 cm, endometrium 1.43 mm, a single fibroid 1.67 x 1.57 x 1.49 cm and right ovary 3.40 x 1.86 x 2.46 cm and left ovary 3.06 x 2.25 x 1.48 cm. A review of both medical and surgical evaluative and management options were given to the patient however, she would like to pursue surgical evaluation and management in the form of laparoscopy.   Past Medical History  OB History: G: 5; P: 2-0-2-2;  2006: C-section and 2007: SVB  GYN History: menarche: 28 YO   LMP: 03/27/2014    Contracepton: Condoms/Oral Contraceptives    Denies history of abnormal PAP smear or STDs;   Last PAP smear: 2014  Medical History: Diabetes Mellitus and  Neuropathy (hands/feet/legs)  Surgical History: None (except C-section)  Denies problems with anesthesia or history of blood transfusions  Family History: Hypertension, Glaucoma and Diabetes Mellitus  Social History:  Married and unemployed;  Denies tobacco use and occasional alcohol use   Medication:  Ketorolac 10 mg every 6 hours as needed Novolog Mix  70-30 as directed Yaz  daily  Allergies  Allergen Reactions  . Pramipexole Shortness Of Breath  Shrimp: respiratory distress  Denies sensitivity to peanuts, soy, latex or adhesives.   ROS: Admits to glasses and pelvic pain but   denies headache, vision changes, nasal congestion, dysphagia, tinnitus, dizziness, hoarseness, cough,  chest pain, shortness of breath, nausea, vomiting, diarrhea,constipation,  urinary frequency, urgency  dysuria, hematuria, vaginitis symptoms,  swelling of joints,easy bruising,  myalgias, arthralgias, skin rashes, unexplained weight loss and except as is mentioned in the history of present illness, patient's review of systems is otherwise negative.  Physical Exam  Bp: 100/62  P: 68  R: 16  Temperature: 98.8 degrees F orally   Weight: 155 lbs.  Height: 5\' 7"   BMI: 24.3  Neck: supple without masses or thyromegaly Lungs: clear to auscultation Heart: regular rate and rhythm Abdomen: soft,  diffusely tender with voluntary guarding but no rebound or  organomegaly Pelvic:EGBUS- wnl; vagina-normal rugae; uterus-normal size, tender, cervix without lesions or motion tenderness; adnexae- tender bilaterally but no masses Extremities:  no clubbing, cyanosis or edema   Assesment: Chronic Pelvic Pain   Disposition:  Reviewed the indications of her procedure along with the risks of  to include, but not limited to: reaction to anesthesia, damage to adjacent organs, infection and excessive bleeding. The patient was given a Miralax Bowel Prep to complete the day before her surgery. The patient verbalized understanding of these risks and pre-operative  instructions  and has consented to proceed with Diagnostic Laparoscopy with Robot Assistance at [place] on April 12, 2014 at 1 p.m.  CSN# [id number]   [place] Karpf J. Florene Glen, PA-C  for Dr. Dede Query. [place]

## 2014-04-07 ENCOUNTER — Encounter (HOSPITAL_COMMUNITY): Payer: Self-pay | Admitting: Pharmacist

## 2014-04-08 ENCOUNTER — Encounter (HOSPITAL_COMMUNITY)
Admission: RE | Admit: 2014-04-08 | Discharge: 2014-04-08 | Disposition: A | Discharge status: Home / Self Care | Payer: Medicare Other | Source: Ambulatory Visit | Attending: Obstetrics and Gynecology | Admitting: Obstetrics and Gynecology

## 2014-04-08 ENCOUNTER — Encounter (HOSPITAL_COMMUNITY): Payer: Self-pay

## 2014-04-08 DIAGNOSIS — R109 Unspecified abdominal pain: Secondary | ICD-10-CM | POA: Diagnosis present

## 2014-04-08 DIAGNOSIS — Z01818 Encounter for other preprocedural examination: Secondary | ICD-10-CM | POA: Insufficient documentation

## 2014-04-08 LAB — BASIC METABOLIC PANEL
Anion gap: 13 (ref 5–15)
BUN: 11 mg/dL (ref 6–23)
CO2: 26 meq/L (ref 19–32)
CREATININE: 0.71 mg/dL (ref 0.50–1.10)
Calcium: 9.3 mg/dL (ref 8.4–10.5)
Chloride: 89 mEq/L — ABNORMAL LOW (ref 96–112)
GFR calc Af Amer: >90 mL/min (ref >90)
GFR calc non Af Amer: >90 mL/min (ref >90)
Glucose, Bld: 558 mg/dL (ref 70–99)
Potassium: 4.6 mEq/L (ref 3.7–5.3)
Sodium: 128 mEq/L — ABNORMAL LOW (ref 137–147)

## 2014-04-08 LAB — CBC
HCT: 44.2 % (ref 36.0–46.0)
Hemoglobin: 15.5 g/dL — ABNORMAL HIGH (ref 12.0–15.0)
MCH: 30.5 pg (ref 26.0–34.0)
MCHC: 35.1 g/dL (ref 30.0–36.0)
MCV: 86.8 fL (ref 78.0–100.0)
Platelets: 228 10*3/uL (ref 150–400)
RBC: 5.09 MIL/uL (ref 3.87–5.11)
RDW: 12.1 % (ref 11.5–15.5)
WBC: 5.1 10*3/uL (ref 4.0–10.5)

## 2014-04-08 NOTE — Pre-Procedure Instructions (Signed)
Blood sugar of 558 called to Dr Margreta Journey office Juventino Slovak). No orders at this time. Results also given to Dr Royce Macadamia. Instructions given to advise Dr Cletis Media, surgery should be canceled until diabetes under control. Voice message left for Adrianne advising her of this.

## 2014-04-08 NOTE — Patient Instructions (Signed)
[place]  04/08/2014   Your procedure is scheduled on:  04/12/14  Enter through the Main Entrance of [facility] at [place] up the phone at the desk and dial [phone].   Call this number if you have problems the morning of surgery: [phone]   Remember:   Do not eat food:After Midnight.  Do not drink clear liquids: 6 Hours before arrival.  Take these medicines the morning of surgery with A SIP OF WATER: use 1/2 dose of Insulin at bedtime.   Do not wear jewelry, make-up or nail polish.  Do not wear lotions, powders, or perfumes. You may wear deodorant.  Do not shave 48 hours prior to surgery.  Do not bring valuables to the hospital.  [facility] is not   responsible for any belongings or valuables brought to the hospital.  Contacts, dentures or bridgework may not be worn into surgery.  Leave suitcase in the car. After surgery it may be brought to your room.  For patients admitted to the hospital, checkout time is 11:00 AM the day of              discharge.   Patients discharged the day of surgery will not be allowed to drive             home.  Name and phone number of your driver: NA  Special Instructions:      Please read over the following fact sheets that you were given:   Surgical Site Infection Prevention

## 2014-04-13 ENCOUNTER — Encounter: Payer: Self-pay | Admitting: Internal Medicine

## 2014-04-19 ENCOUNTER — Telehealth: Payer: Self-pay | Admitting: Internal Medicine

## 2014-04-19 NOTE — Telephone Encounter (Signed)
Dismissal Letter sent by Certified Mail 40/98/1191  Dismissal Letter returned Unclaimed 08/01/2014  Dismissal Letter sent by Certified Mail to [address] [place] 08/03/2014  Dismissal Letter returned Unclaimed 09/06/2014  Dismissal Letter sent by 1st Class Mail 09/07/2014

## 2014-06-20 ENCOUNTER — Encounter (HOSPITAL_COMMUNITY): Payer: Self-pay

## 2014-07-01 ENCOUNTER — Ambulatory Visit (HOSPITAL_COMMUNITY)
Admission: RE | Admit: 2014-07-01 | Payer: BC Managed Care – PPO | Source: Ambulatory Visit | Admitting: Obstetrics and Gynecology

## 2014-07-01 ENCOUNTER — Encounter (HOSPITAL_COMMUNITY): Admission: RE | Payer: Self-pay | Source: Ambulatory Visit

## 2014-07-01 SURGERY — ROBOTIC ASSISTED LAPAROSCOPIC LYSIS OF ADHESION
Anesthesia: General

## 2015-06-06 ENCOUNTER — Encounter (HOSPITAL_COMMUNITY): Payer: Self-pay | Admitting: Emergency Medicine

## 2015-06-06 ENCOUNTER — Emergency Department (HOSPITAL_COMMUNITY)
Admission: EM | Admit: 2015-06-06 | Discharge: 2015-06-06 | Disposition: A | Discharge status: Home / Self Care | Payer: Medicare Other | Attending: Emergency Medicine | Admitting: Emergency Medicine

## 2015-06-06 DIAGNOSIS — G8929 Other chronic pain: Secondary | ICD-10-CM | POA: Insufficient documentation

## 2015-06-06 DIAGNOSIS — E1065 Type 1 diabetes mellitus with hyperglycemia: Secondary | ICD-10-CM | POA: Diagnosis not present

## 2015-06-06 DIAGNOSIS — Z794 Long term (current) use of insulin: Secondary | ICD-10-CM | POA: Diagnosis not present

## 2015-06-06 DIAGNOSIS — Z9114 Patient's other noncompliance with medication regimen: Secondary | ICD-10-CM

## 2015-06-06 LAB — CBG MONITORING, ED
Glucose-Capillary: 491 mg/dL — ABNORMAL HIGH (ref 65–99)
Glucose-Capillary: 304 mg/dL — ABNORMAL HIGH (ref 65–99)

## 2015-06-06 LAB — CBC
HCT: 40.1 % (ref 36.0–46.0)
Hemoglobin: 14.2 g/dL (ref 12.0–15.0)
MCH: 30.5 pg (ref 26.0–34.0)
MCHC: 35.4 g/dL (ref 30.0–36.0)
MCV: 86.2 fL (ref 78.0–100.0)
Platelets: 310 10*3/uL (ref 150–400)
RBC: 4.65 MIL/uL (ref 3.87–5.11)
RDW: 12.2 % (ref 11.5–15.5)
WBC: 6.8 10*3/uL (ref 4.0–10.5)

## 2015-06-06 LAB — URINALYSIS, ROUTINE W REFLEX MICROSCOPIC
Bilirubin Urine: NEGATIVE
Glucose, UA: >1000 mg/dL — AB
Hgb urine dipstick: NEGATIVE
KETONES UR: 40 mg/dL — AB
LEUKOCYTES UA: NEGATIVE
NITRITE: NEGATIVE
PROTEIN: NEGATIVE mg/dL
Specific Gravity, Urine: 1.04 — ABNORMAL HIGH (ref 1.005–1.030)
Urobilinogen, UA: 0.2 mg/dL (ref 0.0–1.0)
pH: 6 (ref 5.0–8.0)

## 2015-06-06 LAB — URINE MICROSCOPIC-ADD ON

## 2015-06-06 LAB — BASIC METABOLIC PANEL
Anion gap: 10 (ref 5–15)
BUN: 12 mg/dL (ref 6–20)
CHLORIDE: 95 mmol/L — AB (ref 101–111)
CO2: 26 mmol/L (ref 22–32)
Calcium: 10.5 mg/dL — ABNORMAL HIGH (ref 8.9–10.3)
Creatinine, Ser: 0.71 mg/dL (ref 0.44–1.00)
GFR calc non Af Amer: >60 mL/min (ref >60)
Glucose, Bld: 480 mg/dL — ABNORMAL HIGH (ref 65–99)
POTASSIUM: 5.1 mmol/L (ref 3.5–5.1)
Sodium: 131 mmol/L — ABNORMAL LOW (ref 135–145)

## 2015-06-06 MED ORDER — INSULIN ASPART 100 UNIT/ML ~~LOC~~ SOLN: 5.0000 [IU] | Freq: Three times a day (TID) | SUBCUTANEOUS | Status: DC

## 2015-06-06 MED ORDER — IBUPROFEN 800 MG PO TABS: 800.0000 mg | ORAL_TABLET | Freq: Three times a day (TID) | ORAL | Status: DC

## 2015-06-06 MED ORDER — INSULIN DETEMIR 100 UNIT/ML ~~LOC~~ SOLN: 10.0000 [IU] | Freq: Every day | SUBCUTANEOUS | Status: DC

## 2015-06-06 MED ORDER — FLUCONAZOLE 150 MG PO TABS: 150.0000 mg | ORAL_TABLET | Freq: Once | ORAL | Status: DC

## 2015-06-06 MED ORDER — INSULIN ASPART 100 UNIT/ML ~~LOC~~ SOLN
20.0000 [IU] | Freq: Once | SUBCUTANEOUS | Status: AC
  Administered 2015-06-06: 20 [IU] via SUBCUTANEOUS
  Filled 2015-06-06: qty 1

## 2015-06-06 NOTE — Progress Notes (Signed)
ED CM spoke with [facility] CM Opal Sidles and EDP Pfeiffer about listed SW consult related pt establishing care with y lowne in 12/2-16 and insurance status changes

## 2015-06-06 NOTE — Progress Notes (Signed)
ED CM spoke with pt to assess further concerns. Pt states ED pharmacy tech dicussed with her that the pharmacy, [facility] may have not entered her information correctly to find her coverage Cm called and spoke with Rob at [employer] on [address] [phone] who was able to find pt listed as [place] without collier for medicare plus using pt DOB, and SSN#.  He ran lantus 10 u qhs but unable to do so until Cm assisted him to confirm her part D medicare provider was [employer] which was not in [facility] data base.  Rob able to run Lantus but noted a medicare recommendation for levemir or tresiba which are [place] medicare tier 3 medications vs lantus which is listed as a tier 4 medication on 2016 [place].  He confirms if pt needs medications today she can go to [facility] and all her information has been updated in their data base Encouraged pt to have her most updated insurance cards Confirms pt last [facility] transaction was March 2016 in which she paid for her medications out of pocket because error indication she was without medicare or medicaid   ED CM and [place] CM Opal Sidles educated pt on need to make sure she has the most updated insurance cards when presenting to her pharmacy, presenting ALL updated insurance cards to the pharmacy, on her present coverage of medicare with medicare part D coverage for medications through [employer] and [place] access, need to provide medicare and medicaid with her updated address changes, different tiers of medications and medicaid transportation    Pt states when she was seen in March 2016 at [facility] she had her new medicaid card but not a new medicare card Pt has disability via SSI.  She reports she has been using her husband blue cross blue shield but is now not eligible for that coverage anymore Pt confirms a recent move since receiving her last medicare card   Pt was assisted to get an appt with [facility] until she could  get to her December 2016 appt with Dr Etter Sjogren  EDP, Pfe[place] updated and given a copy of the aetna medicare 2016 formulary for diabetic medications indicating the tier level 3 medications  ED CM entered below information in pt discharge instructions    [facility] on 06/09/2015 at 10:00am for a folllow up appointment [address] [id number] [place]  Rosalita Chessman Go on 08/07/2015 You are already scheduled for an appt with Dr Kendrick Fries lowen on 08/07/15 at 3;30 pm to established new patient care  [address] [place] [address] [phone]   PLEASE CALL MEDICARE TO GET A NEW MEDICARE CARD TO INCLUDE YOUR AETNA MEDICARE PART D [place] Call on 06/06/2015    [place] [employer] on 06/06/2015 As needed [phone] or medicaid supervisor [phone] or DSS [phone] Assists with transportation to appointments Please call to register with them Today Trips must be reserved by noon on the business day before the Doctor appointment  Your medicaid card lists [facility] as you medicaid [place] access assigned doctor Please call DSS to get this changed to the doctor you plan to see on a rountine basis Call on 06/06/2015  [address]  [place]  Your responsibility as a medicaid [place] access patient Call As needed [place] office [address] [place]  [phone] You must contact your local DSS (department of social services) and SSI (social security office) each time you change address, name, move to a different county in [place] or a different state to keep your address updated to get updated cards, letters, etc

## 2015-06-06 NOTE — ED Notes (Signed)
Case management at bedside.

## 2015-06-06 NOTE — ED Notes (Signed)
Bed: HK74 Expected date:  Expected time:  Means of arrival:  Comments: [place] c

## 2015-06-06 NOTE — ED Notes (Signed)
Bed: [facility] Expected date:  Expected time:  Means of arrival:  Comments: 

## 2015-06-06 NOTE — ED Provider Notes (Signed)
CSN: [id number]     Arrival date & time 06/06/15  0808 History   First MD Initiated Contact with Patient 06/06/15 [phone]     Chief Complaint  Patient presents with  . Hyperglycemia     (Consider location/radiation/quality/duration/timing/severity/associated sxs/prior Treatment) HPI Out of insulin for 4 days. Prescription ran out and not able to see PCP due to insurance transition. Used to have Lantus, reports has only had Novolog for a year due missed appointments and inconsistent care. Since only on Novolog BS typically in 200s. Used to have Lantus 10 in PMs, am BS was in 100s vs 200s with novolog only. Currently taking 10-20 units Novolog sometimes before and after meals b/c BS doesn't get lower than 150-190. Reports has appointment for December. Past Medical History  Diagnosis Date  . Diabetes mellitus   . Neuropathy ([place]) 07/2012  . Chronic painful diabetic neuropathy [facility])    Past Surgical History  Procedure Laterality Date  . Cesarean section      only one C/S per pt.  . Cesarean section    . Dilation and curettage of uterus     Family History  Problem Relation Age of Onset  . Hypertension Mother    Social History  Substance Use Topics  . Smoking status: Never Smoker   . Smokeless tobacco: Never Used  . Alcohol Use: No   OB History    Gravida Para Term Preterm AB TAB SAB Ectopic Multiple Living   4 2 2  2 2    2      Review of Systems  10 Systems reviewed and are negative for acute change except as noted in the HPI.  Allergies  Pramipexole  Home Medications   Prior to Admission medications   Medication Sig Start Date End Date Taking? Authorizing Provider  doxylamine, Sleep, (SLEEP AID) 25 MG tablet Take 50 mg by mouth at bedtime as needed for sleep.   Yes Historical Provider, MD  insulin aspart (NOVOLOG) 100 UNIT/ML injection Inject 10-20 Units into the skin 3 (three) times daily with meals. Sliding scale   Yes Historical Provider, MD  fluconazole (DIFLUCAN)  150 MG tablet Take 1 tablet (150 mg total) by mouth once. 06/06/15   Charlesetta Shanks, MD  ibuprofen (ADVIL,MOTRIN) 800 MG tablet Take 1 tablet (800 mg total) by mouth 3 (three) times daily. 06/06/15   Charlesetta Shanks, MD  insulin aspart (NOVOLOG) 100 UNIT/ML injection Inject 5 Units into the skin 3 (three) times daily before meals. 06/06/15   Charlesetta Shanks, MD  insulin detemir (LEVEMIR) 100 UNIT/ML injection Inject 0.1 mLs (10 Units total) into the skin at bedtime. 06/06/15   Charlesetta Shanks, MD   BP 102/76 mmHg  Pulse 106  Temp(Src) 98 F (36.7 C) (Oral)  Resp 16  SpO2 97% Physical Exam  Constitutional: She is oriented to person, place, and time. She appears well-developed and well-nourished.  HENT:  Head: Normocephalic and atraumatic.  Eyes: EOM are normal. Pupils are equal, round, and reactive to light.  Neck: Neck supple.  Cardiovascular: Normal rate, regular rhythm, normal heart sounds and intact distal pulses.   Pulmonary/Chest: Effort normal and breath sounds normal.  Abdominal: Soft. Bowel sounds are normal. She exhibits no distension. There is no tenderness.  Musculoskeletal: Normal range of motion. She exhibits no edema.  Neurological: She is alert and oriented to person, place, and time. She has normal strength. Coordination normal. GCS eye subscore is 4. GCS verbal subscore is 5. GCS motor subscore is 6.  Skin:  Skin is warm, dry and intact.  Psychiatric: She has a normal mood and affect.    ED Course  Procedures (including critical care time) Labs Review Labs Reviewed  BASIC METABOLIC PANEL - Abnormal; Notable for the following:    Sodium 131 (*)    Chloride 95 (*)    Glucose, Bld 480 (*)    Calcium 10.5 (*)    All other components within normal limits  URINALYSIS, ROUTINE W REFLEX MICROSCOPIC (NOT AT [facility]) - Abnormal; Notable for the following:    Specific Gravity, Urine 1.040 (*)    Glucose, UA >1000 (*)    Ketones, ur 40 (*)    All other components within normal  limits  CBG MONITORING, ED - Abnormal; Notable for the following:    Glucose-Capillary 491 (*)    All other components within normal limits  CBG MONITORING, ED - Abnormal; Notable for the following:    Glucose-Capillary 304 (*)    All other components within normal limits  CBC  URINE MICROSCOPIC-ADD ON    Imaging Review No results found. I have personally reviewed and evaluated these images and lab results as part of my medical decision-making.   EKG Interpretation None      MDM   Final diagnoses:  Hyperglycemia due to type 1 diabetes mellitus ([place])  Patient's other noncompliance with medication regimen   Patient reports she's had difficulty obtaining medications due to insurance issues. Social work has been consult at to help the patient understand her benefits and her ongoing care plan. Prescriptions have been provided for Levemir and NovoLog per what the prescription plan will cover for the patient. The patient is advised and has been scheduled for a recheck at [facility] to follow-up on insulin management until the patient can see be seen by her next assigned provider.    Charlesetta Shanks, MD 06/08/15 [phone]

## 2015-06-06 NOTE — Hospital Discharge Follow-Up (Signed)
Met with the patient and Joellyn Quails, RN CM.  Explained the services provided at [facility] and scheduled an appointment for her on 06/09/15 @ 1000.  The information was placed on the AVS.  The patient stated that she has transportation to the clinic for the appointment.

## 2015-06-06 NOTE — ED Notes (Signed)
Per pt, states she has been out of insulin and she has not been able to control he diabetes

## 2015-06-06 NOTE — Discharge Instructions (Signed)
Diabetes Mellitus and Food It is important for you to manage your blood sugar (glucose) level. Your blood glucose level can be greatly affected by what you eat. Eating healthier foods in the appropriate amounts throughout the day at about the same time each day will help you control your blood glucose level. It can also help slow or prevent worsening of your diabetes mellitus. Healthy eating may even help you improve the level of your blood pressure and reach or maintain a healthy weight.  General recommendations for healthful eating and cooking habits include:  Eating meals and snacks regularly. Avoid going long periods of time without eating to lose weight.  Eating a diet that consists mainly of plant-based foods, such as fruits, vegetables, nuts, legumes, and whole grains.  Using low-heat cooking methods, such as baking, instead of high-heat cooking methods, such as deep frying. Work with your dietitian to make sure you understand how to use the Nutrition Facts information on food labels. HOW CAN FOOD AFFECT ME? Carbohydrates Carbohydrates affect your blood glucose level more than any other type of food. Your dietitian will help you determine how many carbohydrates to eat at each meal and teach you how to count carbohydrates. Counting carbohydrates is important to keep your blood glucose at a healthy level, especially if you are using insulin or taking certain medicines for diabetes mellitus. Alcohol Alcohol can cause sudden decreases in blood glucose (hypoglycemia), especially if you use insulin or take certain medicines for diabetes mellitus. Hypoglycemia can be a life-threatening condition. Symptoms of hypoglycemia (sleepiness, dizziness, and disorientation) are similar to symptoms of having too much alcohol.  If your health care provider has given you approval to drink alcohol, do so in moderation and use the following guidelines:  Women should not have more than one drink per day, and men  should not have more than two drinks per day. One drink is equal to:  12 oz of beer.  5 oz of wine.  1 oz of hard liquor.  Do not drink on an empty stomach.  Keep yourself hydrated. Have water, diet soda, or unsweetened iced tea.  Regular soda, juice, and other mixers might contain a lot of carbohydrates and should be counted. WHAT FOODS ARE NOT RECOMMENDED? As you make food choices, it is important to remember that all foods are not the same. Some foods have fewer nutrients per serving than other foods, even though they might have the same number of calories or carbohydrates. It is difficult to get your body what it needs when you eat foods with fewer nutrients. Examples of foods that you should avoid that are high in calories and carbohydrates but low in nutrients include:  Trans fats (most processed foods list trans fats on the Nutrition Facts label).  Regular soda.  Juice.  Candy.  Sweets, such as cake, pie, doughnuts, and cookies.  Fried foods. WHAT FOODS CAN I EAT? Eat nutrient-rich foods, which will nourish your body and keep you healthy. The food you should eat also will depend on several factors, including:  The calories you need.  The medicines you take.  Your weight.  Your blood glucose level.  Your blood pressure level.  Your cholesterol level. You should eat a variety of foods, including:  Protein.  Lean cuts of meat.  Proteins low in saturated fats, such as fish, egg whites, and beans. Avoid processed meats.  Fruits and vegetables.  Fruits and vegetables that may help control blood glucose levels, such as apples, mangoes, and  yams.  Dairy products.  Choose fat-free or low-fat dairy products, such as milk, yogurt, and cheese.  Grains, bread, pasta, and rice.  Choose whole grain products, such as multigrain bread, whole oats, and brown rice. These foods may help control blood pressure.  Fats.  Foods containing healthful fats, such as nuts,  avocado, olive oil, canola oil, and fish. DOES EVERYONE WITH DIABETES MELLITUS HAVE THE SAME MEAL PLAN? Because every person with diabetes mellitus is different, there is not one meal plan that works for everyone. It is very important that you meet with a dietitian who will help you create a meal plan that is just right for you.   This information is not intended to replace advice given to you by your health care provider. Make sure you discuss any questions you have with your health care provider.   Document Released: 05/02/2005 Document Revised: 08/26/2014 Document Reviewed: 07/02/2013 Elsevier Interactive Patient Education 2016 Elsevier Inc. Diabetic Neuropathy Diabetic neuropathy is a nerve disease or nerve damage that is caused by diabetes mellitus. About half of all people with diabetes mellitus have some form of nerve damage. Nerve damage is more common in those who have had diabetes mellitus for many years and who generally have not had good control of their blood sugar (glucose) level. Diabetic neuropathy is a common complication of diabetes mellitus. There are three common types of diabetic neuropathy and a fourth type that is less common and less understood:   Peripheral neuropathy--This is the most common type of diabetic neuropathy. It causes damage to the nerves of the feet and legs first and then eventually the hands and arms.The damage affects the ability to sense touch.  Autonomic neuropathy--This type causes damage to the autonomic nervous system, which controls the following functions:  Heartbeat.  Body temperature.  Blood pressure.  Urination.  Digestion.  Sweating.  Sexual function.  Focal neuropathy--Focal neuropathy can be painful and unpredictable and occurs most often in older adults with diabetes mellitus. It involves a specific nerve or one area and often comes on suddenly. It usually does not cause long-term problems.  Radiculoplexus neuropathy-- Sometimes  called lumbosacral radiculoplexus neuropathy, radiculoplexus neuropathy affects the nerves of the thighs, hips, buttocks, or legs. It is more common in people with type 2 diabetes mellitus and in older men. It is characterized by debilitating pain, weakness, and atrophy, usually in the thigh muscles. CAUSES  The cause of peripheral, autonomic, and focal neuropathies is diabetes mellitus that is uncontrolled and high glucose levels. The cause of radiculoplexus neuropathy is unknown. However, it is thought to be caused by inflammation related to uncontrolled glucose levels. SIGNS AND SYMPTOMS  Peripheral Neuropathy Peripheral neuropathy develops slowly over time. When the nerves of the feet and legs no longer work there may be:   Burning, stabbing, or aching pain in the legs or feet.  Inability to feel pressure or pain in your feet. This can lead to:  Thick calluses over pressure areas.  Pressure sores.  Ulcers.  Foot deformities.  Reduced ability to feel temperature changes.  Muscle weakness. Autonomic Neuropathy The symptoms of autonomic neuropathy vary depending on which nerves are affected. Symptoms may include:  Problems with digestion, such as:  Feeling sick to your stomach (nausea).  Vomiting.  Bloating.  Constipation.  Diarrhea.  Abdominal pain.  Difficulty with urination. This occurs if you lose your ability to sense when your bladder is full. Problems include:  Urine leakage (incontinence).  Inability to empty your bladder completely (  retention).  Rapid or irregular heartbeat (palpitations).  Blood pressure drops when you stand up (orthostatic hypotension). When you stand up you may feel:  Dizzy.  Weak.  Faint.  In men, inability to attain and maintain an erection.  In women, vaginal dryness and problems with decreased sexual desire and arousal.  Problems with body temperature regulation.  Increased or decreased sweating. Focal  Neuropathy  Abnormal eye movements or abnormal alignment of both eyes.  Weakness in the wrist.  Foot drop. This results in an inability to lift the foot properly and abnormal walking or foot movement.  Paralysis on one side of your face (Bell palsy).  Chest or abdominal pain. Radiculoplexus Neuropathy  Sudden, severe pain in your hip, thigh, or buttocks.  Weakness and wasting of thigh muscles.  Difficulty rising from a seated position.  Abdominal swelling.  Unexplained weight loss (usually more than 10 lb [4.5 kg]). DIAGNOSIS  Peripheral Neuropathy Your senses may be tested. Sensory function testing can be done with:  A light touch using a monofilament.  A vibration with tuning fork.  A sharp sensation with a pin prick. Other tests that can help diagnose neuropathy are:  Nerve conduction velocity. This test checks the transmission of an electrical current through a nerve.  Electromyography. This shows how muscles respond to electrical signals transmitted by nearby nerves.  Quantitative sensory testing. This is used to assess how your nerves respond to vibrations and changes in temperature. Autonomic Neuropathy Diagnosis is often based on reported symptoms. Tell your health care provider if you experience:   Dizziness.   Constipation.   Diarrhea.   Inappropriate urination or inability to urinate.   Inability to get or maintain an erection.  Tests that may be done include:   Electrocardiography or Holter monitor. These are tests that can help show problems with the heart rate or heart rhythm.   An X-ray exam may be done. Focal Neuropathy Diagnosis is made based on your symptoms and what your health care provider finds during your exam. Other tests may be done. They may include:  Nerve conduction velocities. This checks the transmission of electrical current through a nerve.  Electromyography. This shows how muscles respond to electrical signals  transmitted by nearby nerves.  Quantitative sensory testing. This test is used to assess how your nerves respond to vibration and changes in temperature. Radiculoplexus Neuropathy  Often the first thing is to eliminate any other issue or problems that might be the cause, as there is no stick test for diagnosis.  X-ray exam of your spine and lumbar region.  Spinal tap to rule out cancer.  MRI to rule out other lesions. TREATMENT  Once nerve damage occurs, it cannot be reversed. The goal of treatment is to keep the disease or nerve damage from getting worse and affecting more nerve fibers. Controlling your blood glucose level is the key. Most people with radiculoplexus neuropathy see at least a partial improvement over time. You will need to keep your blood glucose and HbA1c levels in the target range determined by your health care provider. Things that help control blood glucose levels include:   Blood glucose monitoring.   Meal planning.   Physical activity.   Diabetes medicine.  Over time, maintaining lower blood glucose levels helps lessen symptoms. Sometimes, prescription pain medicine is needed. HOME CARE INSTRUCTIONS:  Do not smoke.  Keep your blood glucose level in the range that you and your health care provider have determined acceptable for you.  Keep  your blood pressure level in the range that you and your health care provider have determined acceptable for you.  Eat a well-balanced diet.  Be physically active every day. Include strength training and balance exercises.  Protect your feet.  Check your feet every day for sores, cuts, blisters, or signs of infection.  Wear padded socks and supportive shoes. Use orthotic inserts, if necessary.  Regularly check the insides of your shoes for worn spots. Make sure there are no rocks or other items inside your shoes before you put them on. SEEK MEDICAL CARE IF:   You have burning, stabbing, or aching pain in the legs  or feet.  You are unable to feel pressure or pain in your feet.  You develop problems with digestion such as:  Nausea.  Vomiting.  Bloating.  Constipation.  Diarrhea.  Abdominal pain.  You have difficulty with urination, such as:  Incontinence.  Retention.  You have palpitations.  You develop orthostatic hypotension. When you stand up you may feel:  Dizzy.  Weak.  Faint.  You cannot attain and maintain an erection (in men).  You have vaginal dryness and problems with decreased sexual desire and arousal (in women).  You have severe pain in your thighs, legs, or buttocks.  You have unexplained weight loss.   This information is not intended to replace advice given to you by your health care provider. Make sure you discuss any questions you have with your health care provider.   Document Released: 10/14/2001 Document Revised: 08/26/2014 Document Reviewed: 01/14/2013 Elsevier Interactive Patient Education [employer].

## 2015-06-09 ENCOUNTER — Inpatient Hospital Stay: Payer: Medicare Other

## 2015-08-07 ENCOUNTER — Telehealth: Payer: Self-pay | Admitting: Family Medicine

## 2015-08-07 ENCOUNTER — Ambulatory Visit: Payer: Medicare Other | Admitting: Family Medicine

## 2015-08-09 NOTE — Telephone Encounter (Signed)
yes

## 2015-08-09 NOTE — Telephone Encounter (Signed)
Pt was no show 08/07/15 3:30pm for new pt appt, pt has not rescheduled, charge or no charge?

## 2016-03-06 ENCOUNTER — Encounter (HOSPITAL_COMMUNITY): Payer: Self-pay

## 2016-03-06 ENCOUNTER — Emergency Department (HOSPITAL_COMMUNITY)
Admission: EM | Admit: 2016-03-06 | Discharge: 2016-03-06 | Disposition: A | Discharge status: Home / Self Care | Payer: Medicare Other | Attending: Emergency Medicine | Admitting: Emergency Medicine

## 2016-03-06 DIAGNOSIS — E1140 Type 2 diabetes mellitus with diabetic neuropathy, unspecified: Secondary | ICD-10-CM | POA: Diagnosis not present

## 2016-03-06 DIAGNOSIS — T450X1A Poisoning by antiallergic and antiemetic drugs, accidental (unintentional), initial encounter: Secondary | ICD-10-CM

## 2016-03-06 DIAGNOSIS — Z794 Long term (current) use of insulin: Secondary | ICD-10-CM | POA: Diagnosis not present

## 2016-03-06 DIAGNOSIS — R000 Tachycardia, unspecified: Secondary | ICD-10-CM | POA: Diagnosis present

## 2016-03-06 DIAGNOSIS — Y638 Failure in dosage during other surgical and medical care: Secondary | ICD-10-CM | POA: Diagnosis not present

## 2016-03-06 LAB — CBC WITH DIFFERENTIAL/PLATELET
BASOS ABS: 0 10*3/uL (ref 0.0–0.1)
Basophils Relative: 0 %
Eosinophils Absolute: 0.1 10*3/uL (ref 0.0–0.7)
Eosinophils Relative: 2 %
HCT: 33.1 % — ABNORMAL LOW (ref 36.0–46.0)
Hemoglobin: 11.3 g/dL — ABNORMAL LOW (ref 12.0–15.0)
LYMPHS ABS: 1.8 10*3/uL (ref 0.7–4.0)
LYMPHS PCT: 27 %
MCH: 29.7 pg (ref 26.0–34.0)
MCHC: 34.1 g/dL (ref 30.0–36.0)
MCV: 86.9 fL (ref 78.0–100.0)
Monocytes Absolute: 0.4 10*3/uL (ref 0.1–1.0)
Monocytes Relative: 7 %
NEUTROS ABS: 4.2 10*3/uL (ref 1.7–7.7)
Neutrophils Relative %: 64 %
Platelets: 233 10*3/uL (ref 150–400)
RBC: 3.81 MIL/uL — AB (ref 3.87–5.11)
RDW: 12.2 % (ref 11.5–15.5)
WBC: 6.5 10*3/uL (ref 4.0–10.5)

## 2016-03-06 LAB — RAPID URINE DRUG SCREEN, HOSP PERFORMED
AMPHETAMINES: NOT DETECTED
BENZODIAZEPINES: NOT DETECTED
Barbiturates: NOT DETECTED
Cocaine: NOT DETECTED
OPIATES: NOT DETECTED
TETRAHYDROCANNABINOL: POSITIVE — AB

## 2016-03-06 LAB — COMPREHENSIVE METABOLIC PANEL
ALT: 14 U/L (ref 14–54)
AST: 15 U/L (ref 15–41)
Albumin: 3.4 g/dL — ABNORMAL LOW (ref 3.5–5.0)
Alkaline Phosphatase: 55 U/L (ref 38–126)
Anion gap: 9 (ref 5–15)
BUN: 8 mg/dL (ref 6–20)
CHLORIDE: 101 mmol/L (ref 101–111)
CO2: 23 mmol/L (ref 22–32)
CREATININE: 0.62 mg/dL (ref 0.44–1.00)
Calcium: 8.5 mg/dL — ABNORMAL LOW (ref 8.9–10.3)
Glucose, Bld: 316 mg/dL — ABNORMAL HIGH (ref 65–99)
POTASSIUM: 3.5 mmol/L (ref 3.5–5.1)
SODIUM: 133 mmol/L — AB (ref 135–145)
Total Bilirubin: 0.3 mg/dL (ref 0.3–1.2)
Total Protein: 5.8 g/dL — ABNORMAL LOW (ref 6.5–8.1)

## 2016-03-06 LAB — URINALYSIS, ROUTINE W REFLEX MICROSCOPIC
BILIRUBIN URINE: NEGATIVE
Glucose, UA: >1000 mg/dL — AB
Hgb urine dipstick: NEGATIVE
KETONES UR: 40 mg/dL — AB
Leukocytes, UA: NEGATIVE
NITRITE: NEGATIVE
PROTEIN: NEGATIVE mg/dL
Specific Gravity, Urine: 1.015 (ref 1.005–1.030)
pH: 6 (ref 5.0–8.0)

## 2016-03-06 LAB — URINE MICROSCOPIC-ADD ON

## 2016-03-06 LAB — I-STAT BETA HCG BLOOD, ED (MC, WL, AP ONLY)

## 2016-03-06 LAB — LIPASE, BLOOD: LIPASE: 16 U/L (ref 11–51)

## 2016-03-06 MED ORDER — SODIUM CHLORIDE 0.9 % IV BOLUS (SEPSIS)
1000.0000 mL | Freq: Once | INTRAVENOUS | Status: AC
  Administered 2016-03-06: 1000 mL via INTRAVENOUS

## 2016-03-06 NOTE — ED Notes (Addendum)
Pt states she smoked marijuana last night as normal and took 8 25mg  sleep aid (diphenhydraminie)

## 2016-03-06 NOTE — ED Notes (Signed)
Pt informed of need for urine sample; call bell within reach

## 2016-03-06 NOTE — ED Notes (Signed)
Pt made aware need for urine.  

## 2016-03-06 NOTE — ED Provider Notes (Signed)
CSN: [id number]     Arrival date & time 03/06/16  0154 History  By signing my name below, I, Altamease Oiler, attest that this documentation has been prepared under the direction and in the presence of Sharlett Iles, MD. Electronically Signed: Altamease Oiler, ED Scribe. 03/06/2016. 6:44 AM   Chief Complaint  Patient presents with  . Tachycardia   The history is provided by the patient. No language interpreter was used.   [name] is a 29 y.o. female with PMHx of IDDM and neuropathy who presents to the Emergency Department for evaluation after an overdose tonight. Pt states that she took 8 tablets of 25 mg diphenhydramine so that she could sleep. She frequently has problems sleeping. Associated symptoms include racing palpitations, burning central chest pain, and LE numbness. Pt denies SI, vision change, headache, nausea, vomiting, and weakness. She smoked marijuana from a typical source tonight and denies having any reaction after it. She denies cocaine and other drug use. Pt states that her blood sugars have been "up and down" but she has been taking her insulin as prescribed with no recent missed doses.  She denies any suicidal ideation or intent for self-harm.  Past Medical History  Diagnosis Date  . Diabetes mellitus   . Neuropathy ([place]) 07/2012  . Chronic painful diabetic neuropathy [facility])    Past Surgical History  Procedure Laterality Date  . Cesarean section      only one C/S per pt.  . Cesarean section    . Dilation and curettage of uterus     Family History  Problem Relation Age of Onset  . Hypertension Mother    Social History  Substance Use Topics  . Smoking status: Never Smoker   . Smokeless tobacco: Never Used  . Alcohol Use: No   OB History    Gravida Para Term Preterm AB TAB SAB Ectopic Multiple Living   4 2 2  2 2    2      Review of Systems  10 Systems reviewed and all are negative for acute change except as noted in the  HPI.  Allergies  Pramipexole  Home Medications   Prior to Admission medications   Medication Sig Start Date End Date Taking? Authorizing Provider  doxylamine, Sleep, (SLEEP AID) 25 MG tablet Take 50 mg by mouth at bedtime as needed for sleep.   Yes Historical Provider, MD  insulin aspart (NOVOLOG) 100 UNIT/ML injection Inject 5 Units into the skin 3 (three) times daily before meals. 06/06/15  Yes Charlesetta Shanks, MD  insulin detemir (LEVEMIR) 100 UNIT/ML injection Inject 0.1 mLs (10 Units total) into the skin at bedtime. 06/06/15  Yes Charlesetta Shanks, MD  fluconazole (DIFLUCAN) 150 MG tablet Take 1 tablet (150 mg total) by mouth once. Patient not taking: Reported on 03/06/2016 06/06/15   Charlesetta Shanks, MD  ibuprofen (ADVIL,MOTRIN) 800 MG tablet Take 1 tablet (800 mg total) by mouth 3 (three) times daily. Patient not taking: Reported on 03/06/2016 06/06/15   Charlesetta Shanks, MD   BP 117/86 mmHg  Pulse 96  Temp(Src) 97.3 F (36.3 C) (Oral)  Resp 16  SpO2 98% Physical Exam  Constitutional: She is oriented to person, place, and time. She appears well-developed and well-nourished. No distress.  HENT:  Head: Normocephalic and atraumatic.  Moist mucous membranes  Eyes: Conjunctivae are normal. Pupils are equal, round, and reactive to light.  Pupils dilated   Neck: Neck supple.  Cardiovascular: Regular rhythm and normal heart sounds.  Tachycardia present.  No murmur heard. Pulmonary/Chest: Effort normal and breath sounds normal.  Abdominal: Soft. Bowel sounds are normal. She exhibits no distension. There is no tenderness.  Musculoskeletal: She exhibits no edema.  Neurological: She is alert and oriented to person, place, and time. No cranial nerve deficit. Coordination normal.  Fluent speech 5/5 strength and normal sensation X all 4 extremities  Normal finger to nose testing Negative pronator drift   Skin: Skin is warm and dry.  Psychiatric:  Bizarre affect Jittery   Nursing note  and vitals reviewed.   ED Course  Procedures (including critical care time) DIAGNOSTIC STUDIES: Oxygen Saturation is 100% on RA,  normal by my interpretation.    COORDINATION OF CARE: 2:09 AM Discussed treatment plan which includes lab work, EKG, and observation in the ED with pt at bedside and pt agreed to plan.  Labs Review Labs Reviewed  COMPREHENSIVE METABOLIC PANEL - Abnormal; Notable for the following:    Sodium 133 (*)    Glucose, Bld 316 (*)    Calcium 8.5 (*)    Total Protein 5.8 (*)    Albumin 3.4 (*)    All other components within normal limits  CBC WITH DIFFERENTIAL/PLATELET - Abnormal; Notable for the following:    RBC 3.81 (*)    Hemoglobin 11.3 (*)    HCT 33.1 (*)    All other components within normal limits  URINALYSIS, ROUTINE W REFLEX MICROSCOPIC (NOT AT [facility]) - Abnormal; Notable for the following:    Glucose, UA >1000 (*)    Ketones, ur 40 (*)    All other components within normal limits  URINE RAPID DRUG SCREEN, HOSP PERFORMED - Abnormal; Notable for the following:    Tetrahydrocannabinol POSITIVE (*)    All other components within normal limits  URINE MICROSCOPIC-ADD ON - Abnormal; Notable for the following:    Squamous Epithelial / LPF 6-30 (*)    Bacteria, UA FEW (*)    All other components within normal limits  LIPASE, BLOOD  ETHANOL  ACETAMINOPHEN LEVEL  SALICYLATE LEVEL  I-STAT BETA HCG BLOOD, ED ([facility], [facility], [facility] ONLY)    Imaging Review No results found. I have personally reviewed and evaluated these lab results as part of my medical decision-making.   EKG Interpretation   Date/Time:  Wednesday March 06 2016 01:59:31 EDT Ventricular Rate:  138 PR Interval:    QRS Duration: 88 QT Interval:  287 QTC Calculation: 435 R Axis:   85 Text Interpretation:  Sinus tachycardia Ventricular premature complex  Aberrant complex Borderline T abnormalities, inferior leads Baseline  wander in lead(s) III aVF tachycardia new from previous inferior T wave   flattening new Confirmed by LITTLE MD, RACHEL [id number]) on 03/06/2016 2:42:34  AM      MDM   Final diagnoses:  Diphenhydramine overdose, accidental or unintentional, initial encounter   Patient presents after taking 8 25mg  benadryl tablets in an effort to sleep tonight. Husband called EMS after she felt "paralyzed" in her legs. She was jittery with a bizarre affect and mild tremors on exam, heart rate in the 130s. She was initially hypertensive but her BP quickly improved to normal. EKG showed sinus tachycardia without QT prolongation. Please patient on monitoring and gave an IV fluid bolus. Obtained above lab work which showed glucose 316, normal anion gap, UDS positive for THC. I discussed with poison control who recommended supportive care including fluids and benzos if needed for agitation. After several hours of observation in the emergency department, the patient's heart rate  returned to normal. On reexamination, she was well appearing and stated that she felt much better. She again stated that she took the medication to try to sleep and vehemently denied any intent for self-harm. Given her reassuring labs and normalized vital signs, I feel she is safe for discharge. I've discussed supportive care and emphasized the importance of taking only prescribed dose of medications. Patient voiced understanding and was discharged in satisfactory condition.  I personally performed the services described in this documentation, which was scribed in my presence. The recorded information has been reviewed and is accurate.   Sharlett Iles, MD 03/06/16 [phone]

## 2016-03-06 NOTE — Discharge Instructions (Signed)
Please do not take more than recommended doses of over-the-counter medications. If you need a sleep aid, you can use melatonin, 1 tablet before bedtime.  Accidental Overdose A drug overdose occurs when a chemical substance (drug or medication) is used in amounts large enough to overcome a person. This may result in severe illness or death. This is a type of poisoning. Accidental overdoses of medications or other substances come from a variety of reasons. When this happens accidentally, it is often because the person taking the substance does not know enough about what they have taken. Drugs which commonly cause overdose deaths are alcohol, psychotropic medications (medications which affect the mind), pain medications, illegal drugs (street drugs) such as cocaine and heroin, and multiple drugs taken at the same time. It may result from careless behavior (such as over-indulging at a party). Other causes of overdose may include multiple drug use, a lapse in memory, or drug use after a period of no drug use.  Sometimes overdosing occurs because a person cannot remember if they have taken their medication.  A common unintentional overdose in young children involves multi-vitamins containing iron. Iron is a part of the hemoglobin molecule in blood. It is used to transport oxygen to living cells. When taken in small amounts, iron allows the body to restock hemoglobin. In large amounts, it causes problems in the body. If this overdose is not treated, it can lead to death. Never take medicines that show signs of tampering or do not seem quite right. Never take medicines in the dark or in poor lighting. Read the label and check each dose of medicine before you take it. When adults are poisoned, it happens most often through carelessness or lack of information. Taking medicines in the dark or taking medicine prescribed for someone else to treat the same type of problem is a dangerous practice. SYMPTOMS  Symptoms of  overdose depend on the medication and amount taken. They can vary from over-activity with stimulant over-dosage, to sleepiness from depressants such as alcohol, narcotics and tranquilizers. Confusion, dizziness, nausea and vomiting may be present. If problems are severe enough coma and death may result. DIAGNOSIS  Diagnosis and management are generally straightforward if the drug is known. Otherwise it is more difficult. At times, certain symptoms and signs exhibited by the patient, or blood tests, can reveal the drug in question.  TREATMENT  In an emergency department, most patients can be treated with supportive measures. Antidotes may be available if there has been an overdose of opioids or benzodiazepines. A rapid improvement will often occur if this is the cause of overdose. At home or away from medical care:  There may be no immediate problems or warning signs in children.  Not everything works well in all cases of poisoning.  Take immediate action. Poisons may act quickly.  If you think someone has swallowed medicine or a household product, and the person is unconscious, having seizures (convulsions), or is not breathing, immediately call for an ambulance. IF a person is conscious and appears to be doing OK but has swallowed a poison:  Do not wait to see what effect the poison will have. Immediately call a poison control center (listed in the white pages of your telephone book under "Poison Control" or inside the front cover with other emergency numbers). Some poison control centers have TTY capability for the deaf. Check with your local center if you or someone in your family requires this service.  Keep the container so you can  read the label on the product for ingredients.  Describe what, when, and how much was taken and the age and condition of the person poisoned. Inform them if the person is vomiting, choking, drowsy, shows a change in color or temperature of skin, is conscious or  unconscious, or is convulsing.  Do not cause vomiting unless instructed by medical personnel. Do not induce vomiting or force liquids into a person who is convulsing, unconscious, or very drowsy. Stay calm and in control.   Activated charcoal also is sometimes used in certain types of poisoning and you may wish to add a supply to your emergency medicines. It is available without a prescription. Call a poison control center before using this medication. PREVENTION  Thousands of children die every year from unintentional poisoning. This may be from household chemicals, poisoning from carbon monoxide in a car, taking their parent's medications, or simply taking a few iron pills or vitamins with iron. Poisoning comes from unexpected sources.  Store medicines out of the sight and reach of children, preferably in a locked cabinet. Do not keep medications in a food cabinet. Always store your medicines in a secure place. Get rid of expired medications.  If you have children living with you or have them as occasional guests, you should have child-resistant caps on your medicine containers. Keep everything out of reach. Child proof your home.  If you are called to the telephone or to answer the door while you are taking a medicine, take the container with you or put the medicine out of the reach of small children.  Do not take your medication in front of children. Do not tell your child how good a medication is and how good it is for them. They may get the idea it is more of a treat.  If you are an adult and have accidentally taken an overdose, you need to consider how this happened and what can be done to prevent it from happening again. If this was from a street drug or alcohol, determine if there is a problem that needs addressing. If you are not sure a problems exists, it is easy to talk to a professional and ask them if they think you have a problem. It is better to handle this problem in this way before  it happens again and has a much worse consequence.   This information is not intended to replace advice given to you by your health care provider. Make sure you discuss any questions you have with your health care provider.   Document Released: 10/19/2004 Document Revised: 08/26/2014 Document Reviewed: 01/23/2015 Elsevier Interactive Patient Education [employer].

## 2016-03-19 LAB — HM PAP SMEAR

## 2016-06-16 ENCOUNTER — Emergency Department (HOSPITAL_COMMUNITY)
Admission: EM | Admit: 2016-06-16 | Discharge: 2016-06-16 | Disposition: A | Discharge status: Home / Self Care | Payer: Medicare Other | Attending: Emergency Medicine | Admitting: Emergency Medicine

## 2016-06-16 ENCOUNTER — Emergency Department (HOSPITAL_COMMUNITY): Payer: Medicare Other

## 2016-06-16 ENCOUNTER — Encounter (HOSPITAL_COMMUNITY): Payer: Self-pay | Admitting: *Deleted

## 2016-06-16 DIAGNOSIS — R002 Palpitations: Secondary | ICD-10-CM | POA: Insufficient documentation

## 2016-06-16 DIAGNOSIS — Z79899 Other long term (current) drug therapy: Secondary | ICD-10-CM | POA: Insufficient documentation

## 2016-06-16 DIAGNOSIS — F1290 Cannabis use, unspecified, uncomplicated: Secondary | ICD-10-CM | POA: Insufficient documentation

## 2016-06-16 DIAGNOSIS — E1040 Type 1 diabetes mellitus with diabetic neuropathy, unspecified: Secondary | ICD-10-CM | POA: Diagnosis not present

## 2016-06-16 LAB — URINALYSIS, ROUTINE W REFLEX MICROSCOPIC
BILIRUBIN URINE: NEGATIVE
Glucose, UA: >1000 mg/dL — AB
Ketones, ur: NEGATIVE mg/dL
Leukocytes, UA: NEGATIVE
Nitrite: NEGATIVE
PH: 6 (ref 5.0–8.0)
Protein, ur: 30 mg/dL — AB
SPECIFIC GRAVITY, URINE: 1.043 — AB (ref 1.005–1.030)

## 2016-06-16 LAB — CBC WITH DIFFERENTIAL/PLATELET
BASOS ABS: 0 10*3/uL (ref 0.0–0.1)
Basophils Relative: 0 %
EOS PCT: 2 %
Eosinophils Absolute: 0.2 10*3/uL (ref 0.0–0.7)
HEMATOCRIT: 39.9 % (ref 36.0–46.0)
HEMOGLOBIN: 14.6 g/dL (ref 12.0–15.0)
LYMPHS ABS: 4.6 10*3/uL — AB (ref 0.7–4.0)
LYMPHS PCT: 50 %
MCH: 31.2 pg (ref 26.0–34.0)
MCHC: 36.6 g/dL — ABNORMAL HIGH (ref 30.0–36.0)
MCV: 85.3 fL (ref 78.0–100.0)
Monocytes Absolute: 0.7 10*3/uL (ref 0.1–1.0)
Monocytes Relative: 8 %
NEUTROS ABS: 3.6 10*3/uL (ref 1.7–7.7)
Neutrophils Relative %: 39 %
PLATELETS: 319 10*3/uL (ref 150–400)
RBC: 4.68 MIL/uL (ref 3.87–5.11)
RDW: 12.2 % (ref 11.5–15.5)
WBC: 9.2 10*3/uL (ref 4.0–10.5)

## 2016-06-16 LAB — COMPREHENSIVE METABOLIC PANEL
ALK PHOS: 78 U/L (ref 38–126)
ALT: 19 U/L (ref 14–54)
AST: 19 U/L (ref 15–41)
Albumin: 4.4 g/dL (ref 3.5–5.0)
Anion gap: 14 (ref 5–15)
BILIRUBIN TOTAL: 0.4 mg/dL (ref 0.3–1.2)
BUN: 12 mg/dL (ref 6–20)
CALCIUM: 10.2 mg/dL (ref 8.9–10.3)
CHLORIDE: 96 mmol/L — AB (ref 101–111)
CO2: 22 mmol/L (ref 22–32)
CREATININE: 0.96 mg/dL (ref 0.44–1.00)
Glucose, Bld: 421 mg/dL — ABNORMAL HIGH (ref 65–99)
Potassium: 3.3 mmol/L — ABNORMAL LOW (ref 3.5–5.1)
Sodium: 132 mmol/L — ABNORMAL LOW (ref 135–145)
TOTAL PROTEIN: 7.7 g/dL (ref 6.5–8.1)

## 2016-06-16 LAB — RAPID URINE DRUG SCREEN, HOSP PERFORMED
AMPHETAMINES: NOT DETECTED
BARBITURATES: NOT DETECTED
Benzodiazepines: NOT DETECTED
Cocaine: NOT DETECTED
OPIATES: NOT DETECTED
TETRAHYDROCANNABINOL: POSITIVE — AB

## 2016-06-16 LAB — URINE MICROSCOPIC-ADD ON

## 2016-06-16 LAB — I-STAT BETA HCG BLOOD, ED (MC, WL, AP ONLY): I-stat hCG, quantitative: <5 m[IU]/mL (ref <5)

## 2016-06-16 LAB — I-STAT TROPONIN, ED: TROPONIN I, POC: 0 ng/mL (ref 0.00–0.08)

## 2016-06-16 MED ORDER — POTASSIUM CHLORIDE CRYS ER 20 MEQ PO TBCR
40.0000 meq | EXTENDED_RELEASE_TABLET | Freq: Once | ORAL | Status: AC
  Administered 2016-06-16: 40 meq via ORAL
  Filled 2016-06-16: qty 2

## 2016-06-16 MED ORDER — SODIUM CHLORIDE 0.9 % IV BOLUS (SEPSIS)
1000.0000 mL | Freq: Once | INTRAVENOUS | Status: AC
  Administered 2016-06-16: 1000 mL via INTRAVENOUS

## 2016-06-16 MED ORDER — LORAZEPAM 2 MG/ML IJ SOLN
0.5000 mg | Freq: Once | INTRAMUSCULAR | Status: AC
  Administered 2016-06-16: 0.5 mg via INTRAVENOUS
  Filled 2016-06-16: qty 1

## 2016-06-16 NOTE — ED Provider Notes (Signed)
[facility] DEPT Provider Note   CSN: [id number] Arrival date & time: 06/16/16  0023  History   Chief Complaint Chief Complaint  Patient presents with  . Chest Pain  . Palpitations   HPI [name] is a 30 y.o. female. HPI   Patient comes to the ER hx of diabetes, and neuropathy with complaints of chest pain and palpitations that woke her up this evening. She smoked marijuana last night and is feeling different then her usual experience. She admits to taking a Sleep Aid with her weed. She has done this before and had sensation of CP and heart racing. She came to the ER this time because she has had heart racing, CP and also felt palpitations, she cannot recall the name of the sleep aid. She is no longer experiencing CP at  This time. No SOB. No coughing, fevers, N/V/D. No back pain or abdominal pain. No diaphoresis.  Past Medical History:  Diagnosis Date  . Chronic painful diabetic neuropathy ([place])   . Diabetes mellitus   . Neuropathy ([place]) 07/2012    Patient Active Problem List   Diagnosis Date Noted  . Diabetic peripheral neuropathy associated with type 1 diabetes mellitus ([place]) 11/09/2012  . 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 CESAREAN SECTION     only one C/S per pt.  . CESAREAN SECTION    . DILATION AND CURETTAGE OF UTERUS      OB History    Gravida Para Term Preterm AB Living   4 2 2   2 2    SAB TAB Ectopic Multiple Live Births     2             Home Medications    Prior to Admission medications   Medication Sig Start Date End Date Taking? Authorizing Provider  doxylamine, Sleep, (SLEEP AID) 25 MG tablet Take 50 mg by mouth at bedtime as needed for sleep.   Yes Historical Provider, MD  insulin aspart (NOVOLOG) 100 UNIT/ML injection Inject 5 Units into the skin 3 (three) times daily before meals. Patient taking  differently: Inject 10-20 Units into the skin 3 (three) times daily before meals.  06/06/15  Yes Charlesetta Shanks, MD  insulin detemir (LEVEMIR) 100 UNIT/ML injection Inject 0.1 mLs (10 Units total) into the skin at bedtime. Patient taking differently: Inject 60 Units into the skin at bedtime.  06/06/15  Yes Charlesetta Shanks, MD  medroxyPROGESTERone (DEPO-PROVERA) 150 MG/ML injection Inject 150 mg into the muscle every 3 (three) months.   Yes Historical Provider, MD    Family History Family History  Problem Relation Age of Onset  . Hypertension Mother     Social History Social History  Substance Use Topics  . Smoking status: Never Smoker  . Smokeless tobacco: Never Used  . Alcohol use No     Allergies   Pramipexole   Review of Systems Review of Systems Review of Systems All other systems negative except as documented in the HPI. All pertinent positives and negatives as reviewed in the HPI.    Physical Exam Updated Vital Signs BP 107/82   Pulse 87   Temp 99 F (37.2 C) (Oral)   Resp 18   Wt 68 kg   LMP 06/12/2016   SpO2 98%   BMI 23.49 kg/m   Physical Exam  Constitutional: She is oriented to person, place, and time. She  appears well-developed and well-nourished.  HENT:  Head: Normocephalic and atraumatic.  Eyes: Conjunctivae are normal. Pupils are equal, round, and reactive to light.  Neck: Trachea normal, normal range of motion and full passive range of motion without pain. Neck supple.  Cardiovascular: Regular rhythm and normal pulses.  Tachycardia present.   Pulmonary/Chest: Effort normal and breath sounds normal. Chest wall is not dull to percussion. She exhibits no tenderness, no crepitus, no edema, no deformity and no retraction.  Abdominal: Soft. Normal appearance and bowel sounds are normal.  Musculoskeletal: Normal range of motion.  Neurological: She is alert and oriented to person, place, and time. She has normal strength.  Skin: Skin is warm, dry and  intact.  Psychiatric: Her speech is normal and behavior is normal. Judgment and thought content normal. Her mood appears anxious. Cognition and memory are normal.     ED Treatments / Results  Labs (all labs ordered are listed, but only abnormal results are displayed) Labs Reviewed  CBC WITH DIFFERENTIAL/PLATELET - Abnormal; Notable for the following:       Result Value   MCHC 36.6 (*)    Lymphs Abs 4.6 (*)    All other components within normal limits  COMPREHENSIVE METABOLIC PANEL - Abnormal; Notable for the following:    Sodium 132 (*)    Potassium 3.3 (*)    Chloride 96 (*)    Glucose, Bld 421 (*)    All other components within normal limits  URINALYSIS, ROUTINE W REFLEX MICROSCOPIC (NOT AT [facility]) - Abnormal; Notable for the following:    Specific Gravity, Urine 1.043 (*)    Glucose, UA >1000 (*)    Hgb urine dipstick MODERATE (*)    Protein, ur 30 (*)    All other components within normal limits  RAPID URINE DRUG SCREEN, HOSP PERFORMED - Abnormal; Notable for the following:    Tetrahydrocannabinol POSITIVE (*)    All other components within normal limits  URINE MICROSCOPIC-ADD ON - Abnormal; Notable for the following:    Squamous Epithelial / LPF 0-5 (*)    Bacteria, UA FEW (*)    All other components within normal limits  I-STAT TROPOININ, ED  I-STAT BETA HCG BLOOD, ED ([facility], [facility], [facility] ONLY)    EKG  EKG Interpretation None       Radiology Dg Chest 2 View  Result Date: 06/16/2016 CLINICAL DATA:  Substernal chest pain and shortness of breath. EXAM: CHEST  2 VIEW COMPARISON:  April 24, 2011 FINDINGS: The heart size and mediastinal contours are within normal limits. Both lungs are clear. The visualized skeletal structures are unremarkable. IMPRESSION: No active cardiopulmonary disease. Electronically Signed   By: Dorise Bullion III M.D   On: 06/16/2016 01:14    Procedures Procedures (including critical care time)  Medications Ordered in ED Medications  potassium  chloride SA (K-DUR,KLOR-CON) CR tablet 40 mEq (not administered)  sodium chloride 0.9 % bolus 1,000 mL (1,000 mLs Intravenous New Bag/Given 06/16/16 0256)  LORazepam (ATIVAN) injection 0.5 mg (0.5 mg Intravenous Given 06/16/16 0256)     Initial Impression / Assessment and Plan / ED Course  I have reviewed the triage vital signs and the nursing notes.  Pertinent labs & imaging results that were available during my care of the patient were reviewed by me and considered in my medical decision making (see chart for details).  Clinical Course    The patients symptoms improved with a liter of fluid and some Ativan IV. She is no longer tachycardic  and denies CP symptoms or anxiety now. She had been mixing medications, which she has been advised to no longer do. Discussed cessation of smoking. Pt rehydrated.   Medications  potassium chloride SA (K-DUR,KLOR-CON) CR tablet 40 mEq (not administered)  sodium chloride 0.9 % bolus 1,000 mL (1,000 mLs Intravenous New Bag/Given 06/16/16 0256)  LORazepam (ATIVAN) injection 0.5 mg (0.5 mg Intravenous Given 06/16/16 0256)    I discussed results, diagnoses and plan with [name]. They voice there understanding and questions were answered. We discussed follow-up recommendations and return precautions.   Final Clinical Impressions(s) / ED Diagnoses   Final diagnoses:  Marijuana use    New Prescriptions New Prescriptions   No medications on file     Leightyn Rodriguez, PA-C 06/16/16 [place], MD 06/16/16 [phone]

## 2016-06-16 NOTE — ED Triage Notes (Signed)
Patient presents with c/o chest pain and palpitations woke up with CP   Stated she smoked some pot last night and feels different

## 2016-06-16 NOTE — ED Notes (Signed)
Pt ambulatory to restroom independently with steady gait noted

## 2016-08-08 ENCOUNTER — Emergency Department (HOSPITAL_COMMUNITY)
Admission: EM | Admit: 2016-08-08 | Discharge: 2016-08-08 | Disposition: A | Discharge status: Home / Self Care | Payer: Medicare Other | Attending: Emergency Medicine | Admitting: Emergency Medicine

## 2016-08-08 ENCOUNTER — Encounter (HOSPITAL_COMMUNITY): Payer: Self-pay | Admitting: Emergency Medicine

## 2016-08-08 DIAGNOSIS — Z79899 Other long term (current) drug therapy: Secondary | ICD-10-CM | POA: Insufficient documentation

## 2016-08-08 DIAGNOSIS — E1040 Type 1 diabetes mellitus with diabetic neuropathy, unspecified: Secondary | ICD-10-CM | POA: Insufficient documentation

## 2016-08-08 DIAGNOSIS — R739 Hyperglycemia, unspecified: Secondary | ICD-10-CM

## 2016-08-08 DIAGNOSIS — E1065 Type 1 diabetes mellitus with hyperglycemia: Secondary | ICD-10-CM | POA: Diagnosis present

## 2016-08-08 LAB — CBC
HCT: 38.2 % (ref 36.0–46.0)
Hemoglobin: 13.5 g/dL (ref 12.0–15.0)
MCH: 30.8 pg (ref 26.0–34.0)
MCHC: 35.3 g/dL (ref 30.0–36.0)
MCV: 87 fL (ref 78.0–100.0)
PLATELETS: 230 10*3/uL (ref 150–400)
RBC: 4.39 MIL/uL (ref 3.87–5.11)
RDW: 12.5 % (ref 11.5–15.5)
WBC: 5.7 10*3/uL (ref 4.0–10.5)

## 2016-08-08 LAB — BASIC METABOLIC PANEL
Anion gap: 9 (ref 5–15)
BUN: 13 mg/dL (ref 6–20)
CALCIUM: 9.4 mg/dL (ref 8.9–10.3)
CO2: 25 mmol/L (ref 22–32)
CREATININE: 0.77 mg/dL (ref 0.44–1.00)
Chloride: 96 mmol/L — ABNORMAL LOW (ref 101–111)
GFR calc Af Amer: >60 mL/min (ref >60)
GLUCOSE: 615 mg/dL — AB (ref 65–99)
Potassium: 4.4 mmol/L (ref 3.5–5.1)
SODIUM: 130 mmol/L — AB (ref 135–145)

## 2016-08-08 LAB — CBG MONITORING, ED
Glucose-Capillary: >600 mg/dL (ref 65–99)
Glucose-Capillary: 340 mg/dL — ABNORMAL HIGH (ref 65–99)
Glucose-Capillary: 362 mg/dL — ABNORMAL HIGH (ref 65–99)

## 2016-08-08 LAB — URINALYSIS, ROUTINE W REFLEX MICROSCOPIC
BILIRUBIN URINE: NEGATIVE
Glucose, UA: >=500 mg/dL — AB
Ketones, ur: NEGATIVE mg/dL
Leukocytes, UA: NEGATIVE
Nitrite: NEGATIVE
Protein, ur: NEGATIVE mg/dL
SPECIFIC GRAVITY, URINE: 1.025 (ref 1.005–1.030)
pH: 6 (ref 5.0–8.0)

## 2016-08-08 LAB — I-STAT BETA HCG BLOOD, ED (MC, WL, AP ONLY): I-stat hCG, quantitative: <5 m[IU]/mL (ref <5)

## 2016-08-08 MED ORDER — INSULIN REGULAR HUMAN 100 UNIT/ML IJ SOLN
INTRAMUSCULAR | Status: DC
  Filled 2016-08-08: qty 2.5

## 2016-08-08 MED ORDER — DEXTROSE 50 % IV SOLN: 25.0000 mL | INTRAVENOUS | Status: DC | PRN

## 2016-08-08 MED ORDER — SODIUM CHLORIDE 0.9 % IV BOLUS (SEPSIS)
2000.0000 mL | Freq: Once | INTRAVENOUS | Status: AC
  Administered 2016-08-08: 2000 mL via INTRAVENOUS

## 2016-08-08 MED ORDER — SODIUM CHLORIDE 0.9 % IV SOLN
INTRAVENOUS | Status: DC
  Administered 2016-08-08: 15:00:00 via INTRAVENOUS

## 2016-08-08 MED ORDER — INSULIN ASPART 100 UNIT/ML ~~LOC~~ SOLN
10.0000 [IU] | Freq: Once | SUBCUTANEOUS | Status: DC
Start: 2016-08-08 — End: 2016-08-08

## 2016-08-08 MED ORDER — DEXTROSE-NACL 5-0.45 % IV SOLN: INTRAVENOUS | Status: DC

## 2016-08-08 MED ORDER — INSULIN ASPART 100 UNIT/ML ~~LOC~~ SOLN
16.0000 [IU] | Freq: Once | SUBCUTANEOUS | Status: AC
  Administered 2016-08-08: 16 [IU] via INTRAVENOUS
  Filled 2016-08-08: qty 1

## 2016-08-08 NOTE — ED Notes (Signed)
Pt ambulatory and independent at discharge.  Verbalized understanding of discharge instructions 

## 2016-08-08 NOTE — ED Provider Notes (Signed)
[facility] DEPT Provider Note   CSN: [id number] Arrival date & time: 08/08/16  1239     History   Chief Complaint Chief Complaint  Patient presents with  . Hyperglycemia    HPI [name] is a 30 y.o. female.  HPI Patient is a 30 year old type I diabetic who presents to the emergency department complaining of nausea and hyperglycemia.  She reports readings today over 600.  She's been compliant with her medications.  She has no dysuria or urinary frequency.  Denies abdominal pain.  No vomiting.  No diarrhea.  No cough or congestion or shortness of breath.  She has a history of neuropathy in her lower extremities.  Symptoms are mild in severity   Past Medical History:  Diagnosis Date  . Chronic painful diabetic neuropathy ([place])   . Diabetes mellitus   . Neuropathy ([place]) 07/2012    Patient Active Problem List   Diagnosis Date Noted  . Diabetic peripheral neuropathy associated with type 1 diabetes mellitus ([place]) 11/09/2012  . 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 CESAREAN SECTION     only one C/S per pt.  . CESAREAN SECTION    . DILATION AND CURETTAGE OF UTERUS      OB History    Gravida Para Term Preterm AB Living   4 2 2   2 2    SAB TAB Ectopic Multiple Live Births     2             Home Medications    Prior to Admission medications   Medication Sig Start Date End Date Taking? Authorizing Provider  doxylamine, Sleep, (SLEEP AID) 25 MG tablet Take 50 mg by mouth at bedtime as needed for sleep.    Historical Provider, MD  insulin aspart (NOVOLOG) 100 UNIT/ML injection Inject 5 Units into the skin 3 (three) times daily before meals. Patient taking differently: Inject 10-20 Units into the skin 3 (three) times daily before meals.  06/06/15   Charlesetta Shanks, MD  insulin detemir (LEVEMIR) 100 UNIT/ML injection Inject 0.1 mLs (10  Units total) into the skin at bedtime. Patient taking differently: Inject 60 Units into the skin at bedtime.  06/06/15   Charlesetta Shanks, MD  medroxyPROGESTERone (DEPO-PROVERA) 150 MG/ML injection Inject 150 mg into the muscle every 3 (three) months.    Historical Provider, MD    Family History Family History  Problem Relation Age of Onset  . Hypertension Mother     Social History Social History  Substance Use Topics  . Smoking status: Never Smoker  . Smokeless tobacco: Never Used  . Alcohol use No     Allergies   Pramipexole   Review of Systems Review of Systems  All other systems reviewed and are negative.    Physical Exam Updated Vital Signs BP 118/84   Pulse 106   Temp 98.2 F (36.8 C)   Resp 16   SpO2 100%   Physical Exam  Constitutional: She is oriented to person, place, and time. She appears well-developed and well-nourished. No distress.  HENT:  Head: Normocephalic and atraumatic.  Eyes: EOM are normal.  Neck: Normal range of motion.  Cardiovascular: Normal rate, regular rhythm and normal heart sounds.   Pulmonary/Chest: Effort normal and breath sounds normal.  Abdominal: Soft. She exhibits no distension. There is no tenderness.  Musculoskeletal: Normal range of motion.  Neurological: She is  alert and oriented to person, place, and time.  Skin: Skin is warm and dry.  Psychiatric: She has a normal mood and affect. Judgment normal.  Nursing note and vitals reviewed.    ED Treatments / Results  Labs (all labs ordered are listed, but only abnormal results are displayed) Labs Reviewed  BASIC METABOLIC PANEL - Abnormal; Notable for the following:       Result Value   Sodium 130 (*)    Chloride 96 (*)    Glucose, Bld 615 (*)    All other components within normal limits  URINALYSIS, ROUTINE W REFLEX MICROSCOPIC - Abnormal; Notable for the following:    Color, Urine STRAW (*)    Glucose, UA >=500 (*)    Hgb urine dipstick MODERATE (*)    Bacteria,  UA RARE (*)    Squamous Epithelial / LPF 0-5 (*)    All other components within normal limits  CBG MONITORING, ED - Abnormal; Notable for the following:    Glucose-Capillary >600 (*)    All other components within normal limits  CBG MONITORING, ED - Abnormal; Notable for the following:    Glucose-Capillary 362 (*)    All other components within normal limits  CBG MONITORING, ED - Abnormal; Notable for the following:    Glucose-Capillary 340 (*)    All other components within normal limits  CBG MONITORING, ED - Abnormal; Notable for the following:    Glucose-Capillary 195 (*)    All other components within normal limits  CBC  I-STAT BETA HCG BLOOD, ED ([facility], [facility], [facility] ONLY)    EKG  EKG Interpretation None       Radiology No results found.  Procedures Procedures (including critical care time)  Medications Ordered in ED Medications  sodium chloride 0.9 % bolus 2,000 mL (0 mLs Intravenous Stopped 08/08/16 1430)  insulin aspart (novoLOG) injection 16 Units (16 Units Intravenous Given 08/08/16 1459)     Initial Impression / Assessment and Plan / ED Course  I have reviewed the triage vital signs and the nursing notes.  Pertinent labs & imaging results that were available during my care of the patient were reviewed by me and considered in my medical decision making (see chart for details).  Clinical Course     Patient feels much better after IV fluids.  Initially she has a significant drop in her blood sugar after IV fluids alone.  She was given her standard sliding scale insulin.  Her blood sugar continued to fall.  Her anion gap is normal.  Bicarbonate is normal.  She has no ketones in her urine.  She feels better.  There is no evidence that she is in diabetic ketoacidosis at this time.  She would discharged home to follow-up with her primary care doctor.  She understands to return to the ER for new or worsening symptoms  Final Clinical Impressions(s) / ED Diagnoses   Final  diagnoses:  Hyperglycemia    New Prescriptions Discharge Medication List as of 08/08/2016  5:01 PM       Jola Schmidt, MD 08/09/16 1009

## 2016-08-08 NOTE — ED Triage Notes (Signed)
Patient in with complaints of hyperglycemia. Nausea no vomiting. Reports readings over 600. Type 1 diabetic.

## 2016-08-08 NOTE — ED Notes (Signed)
CBG 195 taken at 1645

## 2016-08-09 LAB — CBG MONITORING, ED: GLUCOSE-CAPILLARY: 195 mg/dL — AB (ref 65–99)

## 2016-10-31 LAB — HM DIABETES EYE EXAM

## 2016-11-15 ENCOUNTER — Encounter (HOSPITAL_COMMUNITY): Payer: Self-pay | Admitting: *Deleted

## 2016-11-15 ENCOUNTER — Inpatient Hospital Stay (HOSPITAL_COMMUNITY)
Admission: AD | Admit: 2016-11-15 | Discharge: 2016-11-15 | Disposition: A | Discharge status: Home / Self Care | Payer: Medicare Other | Source: Ambulatory Visit | Attending: Obstetrics & Gynecology | Admitting: Obstetrics & Gynecology

## 2016-11-15 DIAGNOSIS — N939 Abnormal uterine and vaginal bleeding, unspecified: Secondary | ICD-10-CM | POA: Diagnosis not present

## 2016-11-15 DIAGNOSIS — G8929 Other chronic pain: Secondary | ICD-10-CM

## 2016-11-15 DIAGNOSIS — R739 Hyperglycemia, unspecified: Secondary | ICD-10-CM | POA: Diagnosis not present

## 2016-11-15 DIAGNOSIS — R102 Pelvic and perineal pain: Secondary | ICD-10-CM | POA: Diagnosis present

## 2016-11-15 LAB — CBC WITH DIFFERENTIAL/PLATELET
BASOS PCT: 0 %
Basophils Absolute: 0 10*3/uL (ref 0.0–0.1)
Eosinophils Absolute: 0.1 10*3/uL (ref 0.0–0.7)
Eosinophils Relative: 2 %
HEMATOCRIT: 40.2 % (ref 36.0–46.0)
HEMOGLOBIN: 14.1 g/dL (ref 12.0–15.0)
LYMPHS ABS: 2.3 10*3/uL (ref 0.7–4.0)
Lymphocytes Relative: 39 %
MCH: 30.1 pg (ref 26.0–34.0)
MCHC: 35.1 g/dL (ref 30.0–36.0)
MCV: 85.9 fL (ref 78.0–100.0)
MONOS PCT: 6 %
Monocytes Absolute: 0.4 10*3/uL (ref 0.1–1.0)
NEUTROS PCT: 53 %
Neutro Abs: 3.1 10*3/uL (ref 1.7–7.7)
Platelets: 272 10*3/uL (ref 150–400)
RBC: 4.68 MIL/uL (ref 3.87–5.11)
RDW: 12.7 % (ref 11.5–15.5)
WBC: 5.9 10*3/uL (ref 4.0–10.5)

## 2016-11-15 LAB — URINALYSIS, ROUTINE W REFLEX MICROSCOPIC
Bilirubin Urine: NEGATIVE
Ketones, ur: NEGATIVE mg/dL
Leukocytes, UA: NEGATIVE
Nitrite: NEGATIVE
PH: 5 (ref 5.0–8.0)
Protein, ur: NEGATIVE mg/dL
Specific Gravity, Urine: 1.03 (ref 1.005–1.030)

## 2016-11-15 LAB — COMPREHENSIVE METABOLIC PANEL
ALBUMIN: 4.3 g/dL (ref 3.5–5.0)
ALK PHOS: 78 U/L (ref 38–126)
ALT: 49 U/L (ref 14–54)
ANION GAP: 9 (ref 5–15)
AST: 40 U/L (ref 15–41)
BILIRUBIN TOTAL: 0.5 mg/dL (ref 0.3–1.2)
BUN: 18 mg/dL (ref 6–20)
CALCIUM: 9.7 mg/dL (ref 8.9–10.3)
CO2: 23 mmol/L (ref 22–32)
CREATININE: 0.8 mg/dL (ref 0.44–1.00)
Chloride: 99 mmol/L — ABNORMAL LOW (ref 101–111)
GFR calc Af Amer: >60 mL/min (ref >60)
GFR calc non Af Amer: >60 mL/min (ref >60)
GLUCOSE: 430 mg/dL — AB (ref 65–99)
Potassium: 4.6 mmol/L (ref 3.5–5.1)
Sodium: 131 mmol/L — ABNORMAL LOW (ref 135–145)
TOTAL PROTEIN: 8 g/dL (ref 6.5–8.1)

## 2016-11-15 LAB — WET PREP, GENITAL
CLUE CELLS WET PREP: NONE SEEN
Sperm: NONE SEEN
Trich, Wet Prep: NONE SEEN
WBC, Wet Prep HPF POC: NONE SEEN
Yeast Wet Prep HPF POC: NONE SEEN

## 2016-11-15 LAB — POCT PREGNANCY, URINE: Preg Test, Ur: NEGATIVE

## 2016-11-15 LAB — GLUCOSE, CAPILLARY: Glucose-Capillary: 397 mg/dL — ABNORMAL HIGH (ref 65–99)

## 2016-11-15 MED ORDER — IBUPROFEN 800 MG PO TABS: 800.0000 mg | ORAL_TABLET | Freq: Three times a day (TID) | ORAL | 2 refills | Status: DC | PRN

## 2016-11-15 MED ORDER — KETOROLAC TROMETHAMINE 60 MG/2ML IM SOLN
60.0000 mg | Freq: Once | INTRAMUSCULAR | Status: AC
  Administered 2016-11-15: 60 mg via INTRAMUSCULAR
  Filled 2016-11-15: qty 2

## 2016-11-15 MED ORDER — INSULIN REGULAR HUMAN 100 UNIT/ML IJ SOLN
10.0000 [IU] | Freq: Once | INTRAMUSCULAR | Status: AC
  Administered 2016-11-15: 10 [IU] via SUBCUTANEOUS

## 2016-11-15 MED ORDER — NORGESTIMATE-ETH ESTRADIOL 0.25-35 MG-MCG PO TABS: 1.0000 | ORAL_TABLET | Freq: Every day | ORAL | 12 refills | Status: DC

## 2016-11-15 NOTE — MAU Note (Signed)
Pt states she has been bleeding for two months.  Has been taking the depo injection since august last injection Feb 28.  Bleeding picked up last week with clots. Has been feeling lightheaded last week but not today.  Having some lower abdominal cramping, has been taking motrin but its not helping.  Slight headache.

## 2016-11-15 NOTE — MAU Provider Note (Signed)
Chief Complaint: Vaginal Bleeding   First Provider Initiated Contact with Patient 11/15/16 1215     SUBJECTIVE HPI: [name] is a 31 y.o. [id number] female who presents to Maternity Admissions reporting menstrual bleeding x 2 months. Was started on Depo-Provera for AUB August 2017. Last injection 10/16/16. Depo not helping. Has Gynecologist, but came to MAU because the bleeding had gone on for so long. Has had episodes for prolonged bleeding in the past.   Of note pt has Tyle 1 DM and reports poor blood sugar control. Sees Family Medicine Dr. Does not have Endocrinologist.   Location: Low abd Quality: cramping Severity: moderate Context: Prolonged menstrual period Timing: intermittent Modifying factors: partial relief w/ 200 mg Motrin Associated signs and symptoms: Pos for mild dizziness last week and passing clots. Neg for fever, chills, syncope, HA, other Hx prolonged, excessive or easy bleeding  Past Medical History:  Diagnosis Date  . Chronic painful diabetic neuropathy ([place])   . Diabetes mellitus   . Neuropathy ([place]) 07/2012   OB History  Gravida Para Term Preterm AB Living  4 2 2   2 2   SAB TAB Ectopic Multiple Live Births    2          # Outcome Date GA Lbr Len/2nd Weight Sex Delivery Anes PTL Lv  4 Term           3 Term           2 TAB           1 TAB              Past Surgical History:  Procedure Laterality Date  . CESAREAN SECTION     only one C/S per pt.  . CESAREAN SECTION    . DILATION AND CURETTAGE OF UTERUS     Social History   Social History  . Marital status: Married    Spouse name: N/A  . Number of children: N/A  . Years of education: N/A   Occupational History  . Not on file.   Social History Main Topics  . Smoking status: Never Smoker  . Smokeless tobacco: Never Used  . Alcohol use No  . Drug use: Yes    Types: Marijuana  . Sexual activity: Yes    Birth control/ protection: None   Other Topics Concern  . Not on file    Social History Narrative  . No narrative on file   Family History  Problem Relation Age of Onset  . Hypertension Mother    No current facility-administered medications on file prior to encounter.    Current Outpatient Prescriptions on File Prior to Encounter  Medication Sig Dispense Refill  . doxylamine, Sleep, (SLEEP AID) 25 MG tablet Take 50 mg by mouth at bedtime as needed for sleep.    Marland Kitchen gabapentin (NEURONTIN) 300 MG capsule Take 300 mg by mouth 3 (three) times daily.  5  . insulin aspart (NOVOLOG) 100 UNIT/ML injection Inject 5 Units into the skin 3 (three) times daily before meals. (Patient taking differently: Inject 10-30 Units into the skin 3 (three) times daily before meals. Per sliding scale) 10 mL 11  . insulin detemir (LEVEMIR) 100 UNIT/ML injection Inject 0.1 mLs (10 Units total) into the skin at bedtime. (Patient taking differently: Inject 60 Units into the skin at bedtime. ) 10 mL 11   Allergies  Allergen Reactions  . Pramipexole Shortness Of Breath    I have reviewed patient's Past Medical Hx, Surgical  Hx, Family Hx, Social Hx, medications and allergies.   Review of Systems  Constitutional: Negative for appetite change, chills, fever and unexpected weight change.  Gastrointestinal: Positive for abdominal pain. Negative for abdominal distention, blood in stool, constipation, diarrhea, nausea and vomiting.  Endocrine: Positive for polyuria.  Genitourinary: Positive for vaginal bleeding. Negative for difficulty urinating, dysuria, flank pain, frequency, hematuria, pelvic pain, urgency and vaginal discharge.  Musculoskeletal: Negative for back pain.  Neurological: Positive for dizziness. Negative for syncope, weakness and headaches.  Hematological: Does not bruise/bleed easily.  Psychiatric/Behavioral: Negative for confusion.    OBJECTIVE Patient Vitals for the past 24 hrs:  BP Temp Temp src Pulse Resp  11/15/16 1154 124/88 97.4 F (36.3 C) Oral (!) 101 16    Constitutional: Well-developed, well-nourished female in no acute distress.  Skin: No Pallor Cardiovascular: normal rate Respiratory: normal rate and effort.  GI: Abd soft, non-tender. MS: Extremities nontender, no edema, normal ROM Neurologic: Alert and oriented x 4.  GU: Neg CVAT.  SPECULUM EXAM: NEFG, physiologic discharge, small amount of dark red blood noted, cervix clean  BIMANUAL: cervix closed; uterus normal size, no adnexal tenderness or masses. No CMT.  LAB RESULTS Results for orders placed or performed during the hospital encounter of 11/15/16 (from the past 24 hour(s))  Urinalysis, Routine w reflex microscopic     Status: Abnormal   Collection Time: 11/15/16 11:40 AM  Result Value Ref Range   Color, Urine YELLOW YELLOW   APPearance CLEAR CLEAR   Specific Gravity, Urine 1.030 1.005 - 1.030   pH 5.0 5.0 - 8.0   Glucose, UA >=500 (A) NEGATIVE mg/dL   Hgb urine dipstick LARGE (A) NEGATIVE   Bilirubin Urine NEGATIVE NEGATIVE   Ketones, ur NEGATIVE NEGATIVE mg/dL   Protein, ur NEGATIVE NEGATIVE mg/dL   Nitrite NEGATIVE NEGATIVE   Leukocytes, UA NEGATIVE NEGATIVE   RBC / HPF 6-30 0 - 5 RBC/hpf   WBC, UA 0-5 0 - 5 WBC/hpf   Bacteria, UA RARE (A) NONE SEEN   Squamous Epithelial / LPF 0-5 (A) NONE SEEN   Mucous PRESENT   Pregnancy, urine POC     Status: None   Collection Time: 11/15/16 11:56 AM  Result Value Ref Range   Preg Test, Ur NEGATIVE NEGATIVE  CBC with Differential/Platelet     Status: None   Collection Time: 11/15/16 12:29 PM  Result Value Ref Range   WBC 5.9 4.0 - 10.5 K/uL   RBC 4.68 3.87 - 5.11 MIL/uL   Hemoglobin 14.1 12.0 - 15.0 g/dL   HCT 40.2 36.0 - 46.0 %   MCV 85.9 78.0 - 100.0 fL   MCH 30.1 26.0 - 34.0 pg   MCHC 35.1 30.0 - 36.0 g/dL   RDW 12.7 11.5 - 15.5 %   Platelets 272 150 - 400 K/uL   Neutrophils Relative % 53 %   Neutro Abs 3.1 1.7 - 7.7 K/uL   Lymphocytes Relative 39 %   Lymphs Abs 2.3 0.7 - 4.0 K/uL   Monocytes Relative 6 %    Monocytes Absolute 0.4 0.1 - 1.0 K/uL   Eosinophils Relative 2 %   Eosinophils Absolute 0.1 0.0 - 0.7 K/uL   Basophils Relative 0 %   Basophils Absolute 0.0 0.0 - 0.1 K/uL  Comprehensive metabolic panel     Status: Abnormal   Collection Time: 11/15/16 12:29 PM  Result Value Ref Range   Sodium 131 (L) 135 - 145 mmol/L   Potassium 4.6 3.5 - 5.1 mmol/L  Chloride 99 (L) 101 - 111 mmol/L   CO2 23 22 - 32 mmol/L   Glucose, Bld 430 (H) 65 - 99 mg/dL   BUN 18 6 - 20 mg/dL   Creatinine, Ser 0.80 0.44 - 1.00 mg/dL   Calcium 9.7 8.9 - 10.3 mg/dL   Total Protein 8.0 6.5 - 8.1 g/dL   Albumin 4.3 3.5 - 5.0 g/dL   AST 40 15 - 41 U/L   ALT 49 14 - 54 U/L   Alkaline Phosphatase 78 38 - 126 U/L   Total Bilirubin 0.5 0.3 - 1.2 mg/dL   GFR calc non Af Amer >60 >60 mL/min   GFR calc Af Amer >60 >60 mL/min   Anion gap 9 5 - 15  Wet prep, genital     Status: None   Collection Time: 11/15/16  2:04 PM  Result Value Ref Range   Yeast Wet Prep HPF POC NONE SEEN NONE SEEN   Trich, Wet Prep NONE SEEN NONE SEEN   Clue Cells Wet Prep HPF POC NONE SEEN NONE SEEN   WBC, Wet Prep HPF POC NONE SEEN NONE SEEN   Sperm NONE SEEN   Glucose, capillary     Status: Abnormal   Collection Time: 11/15/16  2:47 PM  Result Value Ref Range   Glucose-Capillary 397 (H) 65 - 99 mg/dL    IMAGING No results found.  MAU COURSE Orders Placed This Encounter  Procedures  . Wet prep, genital  . Urinalysis, Routine w reflex microscopic  . CBC with Differential/Platelet  . Comprehensive metabolic panel  . Glucose, capillary  . Apply heat to affected area  . Pregnancy, urine POC  . Discharge patient   Meds ordered this encounter  Medications  . DISCONTD: ibuprofen (ADVIL,MOTRIN) 200 MG tablet    Sig: Take 400 mg by mouth every 6 (six) hours as needed for fever.  Marland Kitchen ketorolac (TORADOL) injection 60 mg  . insulin regular (NOVOLIN R,HUMULIN R) 250 units/2.18mL (100 units/mL) injection 10 Units  . ibuprofen (ADVIL,MOTRIN)  800 MG tablet    Sig: Take 1 tablet (800 mg total) by mouth every 8 (eight) hours as needed for fever.    Dispense:  30 tablet    Refill:  2    Order Specific Question:   Supervising Provider    Answer:   Verita Schneiders A [[id number]]  . norgestimate-ethinyl estradiol (ORTHO-CYCLEN,SPRINTEC,PREVIFEM) 0.25-35 MG-MCG tablet    Sig: Take 1 tablet by mouth daily. Skip white pills    Dispense:  1 Package    Refill:  12    Order Specific Question:   Supervising Provider    Answer:   Verita Schneiders A [[id number]]    MDM - AUB, likely anovulatory bleeding. Hgb and VSS. Will start OCP for AUB Tx. Needs to F/U w/ Gynecologist for long-term management. - Uncontrolled DM w/out DKA today. F/U w/ PCP for now. Encouraged pt to find Endo. Contact info given.   ASSESSMENT 1. Abnormal uterine bleeding (AUB)   2. Hyperglycemia   3. Chronic pelvic pain in female     PLAN Discharge home in stable condition. Hyperglycemia and bleeding precautions Follow-up Information    BALAN,BINDUBAL, MD Follow up.   Specialty:  Endocrinology Why:  Diabetes manegement Contact information: [address] [place] [address] [phone]        Sheela Stack, MD Follow up.   Specialty:  Endocrinology Why:  Diabetes manegement  Contact information: [address] [place] [address] [phone]  Gynecologist Follow up.   Why:  for non-emergent Gynecology care       [facility] Follow up.   Why:  for gynecology emergencies Contact information: [address] [id number] [place] [phone]         Allergies as of 11/15/2016      Reactions   Pramipexole Shortness Of Breath      Medication List    TAKE these medications   gabapentin 300 MG capsule Commonly known as:  NEURONTIN Take 300 mg by mouth 3 (three) times daily.   ibuprofen 800 MG tablet Commonly known as:   ADVIL,MOTRIN Take 1 tablet (800 mg total) by mouth every 8 (eight) hours as needed for fever. What changed:  medication strength  how much to take  when to take this   insulin aspart 100 UNIT/ML injection Commonly known as:  NOVOLOG Inject 5 Units into the skin 3 (three) times daily before meals. What changed:  how much to take  additional instructions   insulin detemir 100 UNIT/ML injection Commonly known as:  LEVEMIR Inject 0.1 mLs (10 Units total) into the skin at bedtime. What changed:  how much to take   norgestimate-ethinyl estradiol 0.25-35 MG-MCG tablet Commonly known as:  ORTHO-CYCLEN,SPRINTEC,PREVIFEM Take 1 tablet by mouth daily. Skip white pills   SLEEP AID 25 MG tablet Generic drug:  doxylamine (Sleep) Take 50 mg by mouth at bedtime as needed for sleep.        [place], CNM 11/15/2016  3:06 PM

## 2016-11-15 NOTE — Discharge Instructions (Signed)
Abnormal Uterine Bleeding Abnormal uterine bleeding can affect women at various stages in life, including teenagers, women in their reproductive years, pregnant women, and women who have reached menopause. Several kinds of uterine bleeding are considered abnormal, including:  Bleeding or spotting between periods.  Bleeding after sexual intercourse.  Bleeding that is heavier or more than normal.  Periods that last longer than usual.  Bleeding after menopause. Many cases of abnormal uterine bleeding are minor and simple to treat, while others are more serious. Any type of abnormal bleeding should be evaluated by your health care provider. Treatment will depend on the cause of the bleeding. Follow these instructions at home: Monitor your condition for any changes. The following actions may help to alleviate any discomfort you are experiencing:  Avoid the use of tampons and douches as directed by your health care provider.  Change your pads frequently. You should get regular pelvic exams and Pap tests. Keep all follow-up appointments for diagnostic tests as directed by your health care provider. Contact a health care provider if:  Your bleeding lasts more than 1 week.  You feel dizzy at times. Get help right away if:  You pass out.  You are changing pads every 15 to 30 minutes.  You have abdominal pain.  You have a fever.  You become sweaty or weak.  You are passing large blood clots from the vagina.  You start to feel nauseous and vomit. This information is not intended to replace advice given to you by your health care provider. Make sure you discuss any questions you have with your health care provider. Document Released: 08/05/2005 Document Revised: 01/17/2016 Document Reviewed: 03/04/2013 Elsevier Interactive Patient Education  2017 [employer].   Hyperglycemia Hyperglycemia occurs when the level of sugar (glucose) in the blood is too high. Glucose is a type of sugar  that provides the body's main source of energy. Certain hormones (insulin and glucagon) control the level of glucose in the blood. Insulin lowers blood glucose, and glucagon increases blood glucose. Hyperglycemia can result from having too little insulin in the bloodstream, or from the body not responding normally to insulin. Hyperglycemia occurs most often in people who have diabetes (diabetes mellitus), but it can happen in people who do not have diabetes. It can develop quickly, and it can be life-threatening if it causes you to become severely dehydrated (diabetic ketoacidosis or hyperglycemic hyperosmolar state). Severe hyperglycemia is a medical emergency. What are the causes? If you have diabetes, hyperglycemia may be caused by:  Diabetes medicine.  Medicines that increase blood glucose or affect your diabetes control.  Not eating enough, or not eating often enough.  Changes in physical activity level.  Being sick or having an infection. If you have prediabetes or undiagnosed diabetes:  Hyperglycemia may be caused by those conditions. If you do not have diabetes, hyperglycemia may be caused by:  Certain medicines, including steroid medicines, beta-blockers, epinephrine, and thiazide diuretics.  Stress.  Serious illness.  Surgery.  Diseases of the pancreas.  Infection. What increases the risk? Hyperglycemia is more likely to develop in people who have risk factors for diabetes, such as:  Having a family member with diabetes.  Having a gene for type 1 diabetes that is passed from parent to child (inherited).  Living in an area with cold weather conditions.  Exposure to certain viruses.  Certain conditions in which the body's disease-fighting (immune) system attacks itself (autoimmune disorders).  Being overweight or obese.  Having an inactive (sedentary) lifestyle.  Having been diagnosed with insulin resistance.  Having a history of prediabetes, gestational  diabetes, or polycystic ovarian syndrome (PCOS).  Being of American-Indian, African-American, Hispanic/Latino, or Asian/Pacific Islander descent. What are the signs or symptoms? Hyperglycemia may not cause any symptoms. If you do have symptoms, they may include early warning signs, such as:  Increased thirst.  Hunger.  Feeling very tired.  Needing to urinate more often than usual.  Blurry vision. Other symptoms may develop if hyperglycemia gets worse, such as:  Dry mouth.  Loss of appetite.  Fruity-smelling breath.  Weakness.  Unexpected or rapid weight gain or weight loss.  Tingling or numbness in the hands or feet.  Headache.  Skin that does not quickly return to normal after being lightly pinched and released (poor skin turgor).  Abdominal pain.  Cuts or bruises that are slow to heal. How is this diagnosed? Hyperglycemia is diagnosed with a blood test to measure your blood glucose level. This blood test is usually done while you are having symptoms. Your health care provider may also do a physical exam and review your medical history. You may have more tests to determine the cause of your hyperglycemia, such as:  A fasting blood glucose (FBG) test. You will not be allowed to eat (you will fast) for at least 8 hours before a blood sample is taken.  An A1c (hemoglobin A1c) blood test. This provides information about blood glucose control over the previous 2-3 months.  An oral glucose tolerance test (OGTT). This measures your blood glucose at two times:  After fasting. This is your baseline blood glucose level.  Two hours after drinking a beverage that contains glucose. How is this treated? Treatment depends on the cause of your hyperglycemia. Treatment may include:  Taking medicine to regulate your blood glucose levels. If you take insulin or other diabetes medicines, your medicine or dosage may be adjusted.  Lifestyle changes, such as exercising more, eating  healthier foods, or losing weight.  Treating an illness or infection, if this caused your hyperglycemia.  Checking your blood glucose more often.  Stopping or reducing steroid medicines, if these caused your hyperglycemia. If your hyperglycemia becomes severe and it results in hyperglycemic hyperosmolar state, you must be hospitalized and given IV fluids. Follow these instructions at home: General instructions   Take over-the-counter and prescription medicines only as told by your health care provider.  Do not use any products that contain nicotine or tobacco, such as cigarettes and e-cigarettes. If you need help quitting, ask your health care provider.  Limit alcohol intake to no more than 1 drink per day for nonpregnant women and 2 drinks per day for men. One drink equals 12 oz of beer, 5 oz of wine, or 1 oz of hard liquor.  Learn to manage stress. If you need help with this, ask your health care provider.  Keep all follow-up visits as told by your health care provider. This is important. Eating and drinking   Maintain a healthy weight.  Exercise regularly, as directed by your health care provider.  Stay hydrated, especially when you exercise, get sick, or spend time in hot temperatures.  Eat healthy foods, such as:  Lean proteins.  Complex carbohydrates.  Fresh fruits and vegetables.  Low-fat dairy products.  Healthy fats.  Drink enough fluid to keep your urine clear or pale yellow. If you have diabetes:    Make sure you know the symptoms of hyperglycemia.  Follow your diabetes management plan, as told by  your health care provider. Make sure you:  Take your insulin and medicines as directed.  Follow your exercise plan.  Follow your meal plan. Eat on time, and do not skip meals.  Check your blood glucose as often as directed. Make sure to check your blood glucose before and after exercise. If you exercise longer or in a different way than usual, check your  blood glucose more often.  Follow your sick day plan whenever you cannot eat or drink normally. Make this plan in advance with your health care provider.  Share your diabetes management plan with people in your workplace, school, and household.  Check your urine for ketones when you are ill and as told by your health care provider.  Carry a medical alert card or wear medical alert jewelry. Contact a health care provider if:  Your blood glucose is at or above 240 mg/dL (13.3 mmol/L) for 2 days in a row.  You have problems keeping your blood glucose in your target range.  You have frequent episodes of hyperglycemia. Get help right away if:  You have difficulty breathing.  You have a change in how you think, feel, or act (mental status).  You have nausea or vomiting that does not go away. These symptoms may represent a serious problem that is an emergency. Do not wait to see if the symptoms will go away. Get medical help right away. Call your local emergency services (911 in the [place] Do not drive yourself to the hospital. Summary  Hyperglycemia occurs when the level of sugar (glucose) in the blood is too high.  Hyperglycemia is diagnosed with a blood test to measure your blood glucose level. This blood test is usually done while you are having symptoms. Your health care provider may also do a physical exam and review your medical history.  If you have diabetes, follow your diabetes management plan as told by your health care provider.  Contact your health care provider if you have problems keeping your blood glucose in your target range. This information is not intended to replace advice given to you by your health care provider. Make sure you discuss any questions you have with your health care provider. Document Released: 01/29/2001 Document Revised: 04/22/2016 Document Reviewed: 04/22/2016 Elsevier Interactive Patient Education  2017 [employer].

## 2016-11-18 LAB — GC/CHLAMYDIA PROBE AMP (~~LOC~~) NOT AT ARMC
CHLAMYDIA, DNA PROBE: NEGATIVE
NEISSERIA GONORRHEA: NEGATIVE

## 2017-01-27 ENCOUNTER — Encounter: Payer: Self-pay | Admitting: Family Medicine

## 2017-01-27 ENCOUNTER — Ambulatory Visit (INDEPENDENT_AMBULATORY_CARE_PROVIDER_SITE_OTHER): Payer: Medicare Other | Admitting: Family Medicine

## 2017-01-27 VITALS — BP 100/60 | HR 87 | Temp 98.6°F | Ht 66.0 in | Wt 163.0 lb

## 2017-01-27 DIAGNOSIS — L989 Disorder of the skin and subcutaneous tissue, unspecified: Secondary | ICD-10-CM | POA: Diagnosis not present

## 2017-01-27 DIAGNOSIS — E1042 Type 1 diabetes mellitus with diabetic polyneuropathy: Secondary | ICD-10-CM | POA: Diagnosis not present

## 2017-01-27 LAB — LIPID PANEL
CHOL/HDL RATIO: 3
Cholesterol: 238 mg/dL — ABNORMAL HIGH (ref 0–200)
HDL: 70.6 mg/dL (ref >39.00)
LDL Cholesterol: 141 mg/dL — ABNORMAL HIGH (ref 0–99)
NONHDL: 167.29
Triglycerides: 129 mg/dL (ref 0.0–149.0)
VLDL: 25.8 mg/dL (ref 0.0–40.0)

## 2017-01-27 LAB — HEMOGLOBIN A1C: HEMOGLOBIN A1C: 11.1 % — AB (ref 4.6–6.5)

## 2017-01-27 LAB — COMPREHENSIVE METABOLIC PANEL
ALBUMIN: 3.9 g/dL (ref 3.5–5.2)
ALK PHOS: 41 U/L (ref 39–117)
ALT: 9 U/L (ref 0–35)
AST: 10 U/L (ref 0–37)
BUN: 11 mg/dL (ref 6–23)
CO2: 24 mEq/L (ref 19–32)
CREATININE: 0.73 mg/dL (ref 0.40–1.20)
Calcium: 9.3 mg/dL (ref 8.4–10.5)
Chloride: 101 mEq/L (ref 96–112)
GFR: 120.08 mL/min (ref >60.00)
GLUCOSE: 290 mg/dL — AB (ref 70–99)
Potassium: 4.7 mEq/L (ref 3.5–5.1)
SODIUM: 132 meq/L — AB (ref 135–145)
TOTAL PROTEIN: 7 g/dL (ref 6.0–8.3)
Total Bilirubin: 0.4 mg/dL (ref 0.2–1.2)

## 2017-01-27 MED ORDER — VENLAFAXINE HCL ER 37.5 MG PO CP24: 37.5000 mg | ORAL_CAPSULE | Freq: Every day | ORAL | 2 refills | Status: DC

## 2017-01-27 NOTE — Patient Instructions (Signed)
Take 3 units of Novolog with meals as a baseline plus your sliding scale. Increase your units of Levemir 2 units every 3 days until your morning sugar is consistently less than 140.  If you do not hear anything about your referral in the next 1-2 weeks, call our office and ask for an update.  Give [place] 2-3 business days to get the results of your labs back.

## 2017-01-27 NOTE — Progress Notes (Signed)
Chief Complaint  Patient presents with  . Establish Care    pt want to discuss her DM       New Patient Visit SUBJECTIVE: HPI: [name] is an 31 y.o.female who is being seen for establishing care.  The patient was previously seen at [facility].  Hx of DM 1, poorly controlled, and associated neuropathy. It affects both of her legs up to the proximal one third of tibia. She used to be on gabapentin for this, however it did not help with her symptoms and gave her side effects such as facial tics and mental fogginess. She may have been on Lyrica in the past, however does not remember how well it worked or side effects. She takes 60 units of Levemir nightly that has been decreasing her morning sugars, however they still run in the 200s. There are 200-250 before meals. She uses a sliding scale, she does not remember the scale off the top of her head but it is on her refrigerator, for mealtime insulin. She typically takes around 20 units per meal. Her insulin sensitivity factor is 15. Has any low readings. Her last eye exam was March 2018. No CP or SOB.  Allergies  Allergen Reactions  . Pramipexole Shortness Of Breath    Past Medical History:  Diagnosis Date  . Chronic painful diabetic neuropathy ([place])   . Diabetes mellitus    Type 1  . History of chicken pox   . Neuropathy 07/2012   Past Surgical History:  Procedure Laterality Date  . CESAREAN SECTION     only one C/S per pt.  Marland Kitchen DILATION AND CURETTAGE OF UTERUS     Social History   Social History  . Marital status: Married   Social History Main Topics  . Smoking status: Never Smoker  . Smokeless tobacco: Never Used  . Alcohol use No  . Drug use: Yes    Types: Marijuana  . Sexual activity: Yes    Birth control/ protection: None   Family History  Problem Relation Age of Onset  . Hypertension Mother      Current Outpatient Prescriptions:  .  doxylamine, Sleep, (SLEEP AID) 25 MG tablet, Take 50 mg by mouth  at bedtime as needed for sleep., Disp: , Rfl:  .  ibuprofen (ADVIL,MOTRIN) 800 MG tablet, Take 1 tablet (800 mg total) by mouth every 8 (eight) hours as needed for fever., Disp: 30 tablet, Rfl: 2 .  insulin detemir (LEVEMIR) 100 UNIT/ML injection, Inject 0.1 mLs (10 Units total) into the skin at bedtime. (Patient taking differently: Inject 60 Units into the skin at bedtime. ), Disp: 10 mL, Rfl: 11 .  norgestimate-ethinyl estradiol (ORTHO-CYCLEN,SPRINTEC,PREVIFEM) 0.25-35 MG-MCG tablet, Take 1 tablet by mouth daily. Skip white pills, Disp: 1 Package, Rfl: 12 .  NOVOLOG FLEXPEN 100 UNIT/ML FlexPen, INJECT  1 UNIT UNDER THE SKIN UTD PER SLIDING SCALE, Disp: , Rfl: 2 .  venlafaxine XR (EFFEXOR-XR) 37.5 MG 24 hr capsule, Take 1 capsule (37.5 mg total) by mouth daily with breakfast., Disp: 30 capsule, Rfl: 2  Patient's last menstrual period was 01/17/2017 (exact date).  ROS Cardiovascular: Denies chest pain  Respiratory: Denies dyspnea   OBJECTIVE: BP 100/60 (BP Location: Left Arm, Patient Position: Sitting, Cuff Size: Normal)   Pulse 87   Temp 98.6 F (37 C) (Oral)   Ht 5\' 6"  (1.676 m)   Wt 163 lb (73.9 kg)   LMP 01/17/2017 (Exact Date)   SpO2 98%   BMI 26.31  kg/m   Constitutional: -  VS reviewed -  Well developed, well nourished, appears stated age -  No apparent distress  Psychiatric: -  Oriented to person, place, and time -  Memory intact -  Affect and mood normal -  Fluent conversation, good eye contact -  Judgment and insight age appropriate  Eye: -  Conjunctivae clear, no discharge -  Pupils symmetric, round, reactive to light  ENMT: -  Oral mucosa without lesions, tongue and uvula midline    Tonsils not enlarged, no erythema, no exudate, trachea midline    Pharynx moist, no lesions, no erythema  Neck: -  No gross swelling, no palpable masses -  Thyroid midline, not enlarged, mobile, no palpable masses  Cardiovascular: -  RRR, no murmurs -  No LE edema  Respiratory: -   Normal respiratory effort, no accessory muscle use, no retraction -  Breath sounds equal, no wheezes, no ronchi, no crackles  Gastrointestinal: -  Bowel sounds normal -  No tenderness, no distention, no guarding, no masses  Musculoskeletal: -  No clubbing, no cyanosis -  Gait normal  Skin: -  No significant lesion on inspection -  Circular and hypopigmented lesion on Lateral R thigh, scaly, no TTP or erythema, no fluctuance or drainage   ASSESSMENT/PLAN: Diabetic peripheral neuropathy associated with type 1 diabetes mellitus (HCC) - Plan: venlafaxine XR (EFFEXOR-XR) 37.5 MG 24 hr capsule, Comprehensive metabolic panel, Hemoglobin A1c, Lipid panel  Skin lesion - Plan: Ambulatory referral to Dermatology  Patient instructed to sign release of records form from her previous PCP. Start SNRI. Check labs. Increase Levemir 2 u nightly every 3 days until fasting AM sugars consistently <140. Stop if hypoglycemic. 3 units base Novolog in addition to sliding scale.  Patient should return in 6 weeks. The patient voiced understanding and agreement to the plan.   [place], DO 01/27/17  1:07 PM

## 2017-03-10 ENCOUNTER — Telehealth: Payer: Self-pay | Admitting: Family Medicine

## 2017-03-10 ENCOUNTER — Ambulatory Visit: Payer: Medicare Other | Admitting: Family Medicine

## 2017-03-10 NOTE — Telephone Encounter (Signed)
Patient lvm today at 7:36am cancelling her 10:15am appointment for today, lvm advising her to call back and [facility], charge or no charge

## 2017-03-14 NOTE — Telephone Encounter (Signed)
No charge. 

## 2017-05-04 ENCOUNTER — Other Ambulatory Visit: Payer: Self-pay | Admitting: Family Medicine

## 2017-05-04 DIAGNOSIS — E1042 Type 1 diabetes mellitus with diabetic polyneuropathy: Secondary | ICD-10-CM

## 2017-05-05 ENCOUNTER — Encounter (HOSPITAL_COMMUNITY): Payer: Self-pay | Admitting: *Deleted

## 2017-05-05 ENCOUNTER — Ambulatory Visit (HOSPITAL_COMMUNITY)
Admission: EM | Admit: 2017-05-05 | Discharge: 2017-05-05 | Disposition: A | Discharge status: Home / Self Care | Payer: Medicare Other | Attending: Emergency Medicine | Admitting: Emergency Medicine

## 2017-05-05 DIAGNOSIS — R112 Nausea with vomiting, unspecified: Secondary | ICD-10-CM

## 2017-05-05 DIAGNOSIS — R7309 Other abnormal glucose: Secondary | ICD-10-CM | POA: Diagnosis not present

## 2017-05-05 DIAGNOSIS — Z794 Long term (current) use of insulin: Secondary | ICD-10-CM | POA: Diagnosis not present

## 2017-05-05 DIAGNOSIS — N390 Urinary tract infection, site not specified: Secondary | ICD-10-CM | POA: Insufficient documentation

## 2017-05-05 DIAGNOSIS — R109 Unspecified abdominal pain: Secondary | ICD-10-CM | POA: Diagnosis not present

## 2017-05-05 DIAGNOSIS — G8929 Other chronic pain: Secondary | ICD-10-CM | POA: Insufficient documentation

## 2017-05-05 DIAGNOSIS — R42 Dizziness and giddiness: Secondary | ICD-10-CM | POA: Insufficient documentation

## 2017-05-05 DIAGNOSIS — E1042 Type 1 diabetes mellitus with diabetic polyneuropathy: Secondary | ICD-10-CM | POA: Diagnosis not present

## 2017-05-05 DIAGNOSIS — R1110 Vomiting, unspecified: Secondary | ICD-10-CM | POA: Diagnosis not present

## 2017-05-05 DIAGNOSIS — E1010 Type 1 diabetes mellitus with ketoacidosis without coma: Secondary | ICD-10-CM | POA: Insufficient documentation

## 2017-05-05 LAB — POCT I-STAT, CHEM 8
BUN: 8 mg/dL (ref 6–20)
CALCIUM ION: 1.2 mmol/L (ref 1.15–1.40)
Chloride: 97 mmol/L — ABNORMAL LOW (ref 101–111)
Creatinine, Ser: 0.7 mg/dL (ref 0.44–1.00)
GLUCOSE: 356 mg/dL — AB (ref 65–99)
HCT: 41 % (ref 36.0–46.0)
Hemoglobin: 13.9 g/dL (ref 12.0–15.0)
Potassium: 4.2 mmol/L (ref 3.5–5.1)
Sodium: 135 mmol/L (ref 135–145)
TCO2: 29 mmol/L (ref 22–32)

## 2017-05-05 LAB — POCT URINALYSIS DIP (DEVICE)
Bilirubin Urine: NEGATIVE
Glucose, UA: 500 mg/dL — AB
HGB URINE DIPSTICK: NEGATIVE
Ketones, ur: NEGATIVE mg/dL
Leukocytes, UA: NEGATIVE
NITRITE: POSITIVE — AB
PH: 5.5 (ref 5.0–8.0)
PROTEIN: NEGATIVE mg/dL
Specific Gravity, Urine: 1.02 (ref 1.005–1.030)
Urobilinogen, UA: 0.2 mg/dL (ref 0.0–1.0)

## 2017-05-05 LAB — CBC WITH DIFFERENTIAL/PLATELET
Basophils Absolute: 0 10*3/uL (ref 0.0–0.1)
Basophils Relative: 0 %
Eosinophils Absolute: 0.1 10*3/uL (ref 0.0–0.7)
Eosinophils Relative: 2 %
HEMATOCRIT: 37.4 % (ref 36.0–46.0)
Hemoglobin: 12.9 g/dL (ref 12.0–15.0)
LYMPHS ABS: 2.5 10*3/uL (ref 0.7–4.0)
LYMPHS PCT: 41 %
MCH: 29.8 pg (ref 26.0–34.0)
MCHC: 34.5 g/dL (ref 30.0–36.0)
MCV: 86.4 fL (ref 78.0–100.0)
MONO ABS: 0.4 10*3/uL (ref 0.1–1.0)
MONOS PCT: 6 %
NEUTROS ABS: 3.2 10*3/uL (ref 1.7–7.7)
Neutrophils Relative %: 51 %
Platelets: 344 10*3/uL (ref 150–400)
RBC: 4.33 MIL/uL (ref 3.87–5.11)
RDW: 13.2 % (ref 11.5–15.5)
WBC: 6.2 10*3/uL (ref 4.0–10.5)

## 2017-05-05 MED ORDER — NITROFURANTOIN MONOHYD MACRO 100 MG PO CAPS: 100.0000 mg | ORAL_CAPSULE | Freq: Two times a day (BID) | ORAL | 0 refills | Status: DC

## 2017-05-05 MED ORDER — ONDANSETRON HCL 4 MG PO TABS: 4.0000 mg | ORAL_TABLET | Freq: Four times a day (QID) | ORAL | 0 refills | Status: DC

## 2017-05-05 NOTE — ED Provider Notes (Signed)
[facility]    CSN: [id number] Arrival date & time: 05/05/17  1653     History   Chief Complaint Chief Complaint  Patient presents with  . Dizziness  . Emesis  . Abdominal Pain    HPI [name] is a 31 y.o. female.   Pt c/o n/v/d intermit headaches for the past 2 days now. Denies any lower abd pain. States that she is not able to take her insulin due to she can not eat anything. Alert x4. Has not taken anything pta.        Past Medical History:  Diagnosis Date  . Chronic painful diabetic neuropathy ([place])   . Diabetes mellitus    Type 1  . History of chicken pox   . Neuropathy 07/2012    Patient Active Problem List   Diagnosis Date Noted  . Diabetic peripheral neuropathy associated with type 1 diabetes mellitus ([place]) 11/09/2012  . 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 CESAREAN SECTION     only one C/S per pt.  Marland Kitchen DILATION AND CURETTAGE OF UTERUS      OB History    Gravida Para Term Preterm AB Living   4 2 2   2 2    SAB TAB Ectopic Multiple Live Births     2             Home Medications    Prior to Admission medications   Medication Sig Start Date End Date Taking? Authorizing Provider  doxylamine, Sleep, (SLEEP AID) 25 MG tablet Take 50 mg by mouth at bedtime as needed for sleep.    [provider]  ibuprofen (ADVIL,MOTRIN) 800 MG tablet Take 1 tablet (800 mg total) by mouth every 8 (eight) hours as needed for fever. 11/15/16   Tamala Julian, [place], CNM  insulin detemir (LEVEMIR) 100 UNIT/ML injection Inject 0.1 mLs (10 Units total) into the skin at bedtime. Patient taking differently: Inject 60 Units into the skin at bedtime.  06/06/15   Charlesetta Shanks, MD  nitrofurantoin, macrocrystal-monohydrate, (MACROBID) 100 MG capsule Take 1 capsule (100 mg total) by mouth 2 (two) times daily. 05/05/17   Marney Setting, NP  norgestimate-ethinyl estradiol (ORTHO-CYCLEN,SPRINTEC,PREVIFEM) 0.25-35 MG-MCG tablet Take 1 tablet by mouth daily. Skip white pills 11/15/16   Tamala Julian, [place], CNM  NOVOLOG FLEXPEN 100 UNIT/ML FlexPen INJECT  1 UNIT UNDER THE SKIN UTD PER SLIDING SCALE 01/08/17   [provider]  ondansetron (ZOFRAN) 4 MG tablet Take 1 tablet (4 mg total) by mouth every 6 (six) hours. 05/05/17   Marney Setting, NP  venlafaxine XR (EFFEXOR-XR) 37.5 MG 24 hr capsule TAKE 1 CAPSULE BY MOUTH ONCE DAILY WITH BREAKFAST 05/05/17   Shelda Pal, DO    Family History Family History  Problem Relation Age of Onset  . Hypertension Mother     Social History Social History  Substance Use Topics  . Smoking status: Never Smoker  . Smokeless tobacco: Never Used  . Alcohol use No     Allergies   Pramipexole   Review of Systems Review of Systems  Constitutional: Positive for chills and fatigue.  Respiratory: Negative.   Cardiovascular: Negative.   Gastrointestinal: Positive for diarrhea, nausea and vomiting.  Genitourinary: Negative.   Musculoskeletal: Negative.   Neurological: Positive for headaches.       Intemit headache      Physical  Exam Triage Vital Signs ED Triage Vitals  Enc Vitals Group     BP 05/05/17 1740 120/86     Pulse Rate 05/05/17 1740 (!) 105     Resp 05/05/17 1740 17     Temp 05/05/17 1740 98.6 F (37 C)     Temp Source 05/05/17 1740 Oral     SpO2 05/05/17 1740 100 %     Weight --      Height --      Head Circumference --      Peak Flow --      Pain Score 05/05/17 1739 10     Pain Loc --      Pain Edu? --      Excl. in [place]? --    No data found.   Updated Vital Signs BP 120/86 (BP Location: Left Arm)   Pulse (!) 105   Temp 98.6 F (37 C) (Oral)   Resp 17   SpO2 100%   Visual Acuity Right Eye Distance:   Left Eye Distance:   Bilateral Distance:    Right Eye Near:   Left Eye Near:    Bilateral Near:     Physical Exam    Constitutional: She appears well-developed.  Cardiovascular: Normal rate and regular rhythm.   Pulmonary/Chest: Effort normal and breath sounds normal.  Abdominal: Soft. Bowel sounds are normal.  Musculoskeletal: Normal range of motion.  Neurological: She is alert.  Skin: Skin is warm. Capillary refill takes less than 2 seconds.     UC Treatments / Results  Labs (all labs ordered are listed, but only abnormal results are displayed) Labs Reviewed  POCT URINALYSIS DIP (DEVICE) - Abnormal; Notable for the following:       Result Value   Glucose, UA 500 (*)    Nitrite POSITIVE (*)    All other components within normal limits  CBC WITH DIFFERENTIAL/PLATELET  POCT I-STAT, CHEM 8    EKG  EKG Interpretation None       Radiology No results found.  Procedures Procedures (including critical care time)  Medications Ordered in UC Medications - No data to display   Initial Impression / Assessment and Plan / UC Course  I have reviewed the triage vital signs and the nursing notes.  Pertinent labs & imaging results that were available during my care of the patient were reviewed by me and considered in my medical decision making (see chart for details).    You will need to see your primary care md  Take the full dose of medications  Take nausea meds as needed, after taking this try eating to take your insulin.  If symptoms worse you will need to go to the er.  Reviewed previous chart of non compliant with DM    Final Clinical Impressions(s) / UC Diagnoses   Final diagnoses:  Lower urinary tract infectious disease  Nausea and vomiting, intractability of vomiting not specified, unspecified vomiting type  Elevated glucose level    New Prescriptions New Prescriptions   NITROFURANTOIN, MACROCRYSTAL-MONOHYDRATE, (MACROBID) 100 MG CAPSULE    Take 1 capsule (100 mg total) by mouth 2 (two) times daily.   ONDANSETRON (ZOFRAN) 4 MG TABLET    Take 1 tablet (4 mg total) by mouth  every 6 (six) hours.     Controlled Substance Prescriptions [place] Controlled Substance Registry consulted? Not Applicable   Marney Setting, NP 05/05/17 [phone]

## 2017-05-05 NOTE — Discharge Instructions (Signed)
You will need to see your primary care md  Take the full dose of medications  Take nausea meds as needed, after taking this try eating to take your insulin.  If symptoms worse you will need to go to the er.

## 2017-05-05 NOTE — ED Triage Notes (Addendum)
Patient reports dizziness, temporal headache, nausea, and vomiting. Last CBG was 250. Patient states she has not been eating due to nausea and what she does eat she vomits.

## 2017-07-09 ENCOUNTER — Ambulatory Visit: Payer: Medicare Other | Admitting: Family Medicine

## 2017-08-01 ENCOUNTER — Other Ambulatory Visit: Payer: Self-pay | Admitting: Family Medicine

## 2017-08-01 DIAGNOSIS — E1042 Type 1 diabetes mellitus with diabetic polyneuropathy: Secondary | ICD-10-CM

## 2017-08-21 ENCOUNTER — Other Ambulatory Visit: Payer: Self-pay | Admitting: Family Medicine

## 2017-08-21 ENCOUNTER — Encounter: Payer: Self-pay | Admitting: Family Medicine

## 2017-08-21 MED ORDER — INSULIN DETEMIR 100 UNIT/ML ~~LOC~~ SOLN: 10.0000 [IU] | Freq: Every day | SUBCUTANEOUS | 3 refills | Status: DC

## 2017-08-21 MED ORDER — INSULIN PEN NEEDLE 31G X 5 MM MISC: 1 refills | Status: DC

## 2017-08-21 MED ORDER — NOVOLOG FLEXPEN 100 UNIT/ML ~~LOC~~ SOPN: PEN_INJECTOR | SUBCUTANEOUS | 2 refills | Status: DC

## 2017-08-21 NOTE — Addendum Note (Signed)
Addended by: Sharon Seller B on: 08/21/2017 05:07 PM   Modules accepted: Orders

## 2017-08-21 NOTE — Telephone Encounter (Signed)
OK to refill diabetic supplies. I would like her to make appt before changing medicine around though. Ty.

## 2017-09-12 ENCOUNTER — Encounter: Payer: Self-pay | Admitting: Family Medicine

## 2017-09-12 ENCOUNTER — Ambulatory Visit: Payer: BLUE CROSS/BLUE SHIELD | Admitting: Family Medicine

## 2017-09-12 ENCOUNTER — Telehealth: Payer: Self-pay | Admitting: Family Medicine

## 2017-09-12 VITALS — BP 110/80 | HR 107 | Temp 98.6°F | Ht 67.0 in | Wt 170.4 lb

## 2017-09-12 DIAGNOSIS — E1042 Type 1 diabetes mellitus with diabetic polyneuropathy: Secondary | ICD-10-CM

## 2017-09-12 DIAGNOSIS — R000 Tachycardia, unspecified: Secondary | ICD-10-CM | POA: Diagnosis not present

## 2017-09-12 DIAGNOSIS — Z23 Encounter for immunization: Secondary | ICD-10-CM

## 2017-09-12 LAB — LIPID PANEL
CHOL/HDL RATIO: 4
Cholesterol: 249 mg/dL — ABNORMAL HIGH (ref 0–200)
HDL: 56.6 mg/dL (ref >39.00)
LDL CALC: 157 mg/dL — AB (ref 0–99)
NONHDL: 192.48
Triglycerides: 177 mg/dL — ABNORMAL HIGH (ref 0.0–149.0)
VLDL: 35.4 mg/dL (ref 0.0–40.0)

## 2017-09-12 LAB — CBC
HCT: 41 % (ref 36.0–46.0)
Hemoglobin: 13.8 g/dL (ref 12.0–15.0)
MCHC: 33.6 g/dL (ref 30.0–36.0)
MCV: 91.7 fl (ref 78.0–100.0)
PLATELETS: 335 10*3/uL (ref 150.0–400.0)
RBC: 4.47 Mil/uL (ref 3.87–5.11)
RDW: 13.2 % (ref 11.5–15.5)
WBC: 8.2 10*3/uL (ref 4.0–10.5)

## 2017-09-12 LAB — TSH: TSH: 1.57 u[IU]/mL (ref 0.35–4.50)

## 2017-09-12 LAB — HEMOGLOBIN A1C: Hgb A1c MFr Bld: 10.5 % — ABNORMAL HIGH (ref 4.6–6.5)

## 2017-09-12 MED ORDER — FREESTYLE LIBRE READER DEVI: 1.0000 "application " | Freq: Every day | 0 refills | Status: DC

## 2017-09-12 MED ORDER — DAPAGLIFLOZIN PROPANEDIOL 5 MG PO TABS: 5.0000 mg | ORAL_TABLET | Freq: Every day | ORAL | 2 refills | Status: DC

## 2017-09-12 MED ORDER — INSULIN DETEMIR 100 UNIT/ML ~~LOC~~ SOLN: 65.0000 [IU] | Freq: Two times a day (BID) | SUBCUTANEOUS | 3 refills | Status: DC

## 2017-09-12 MED ORDER — INSULIN DETEMIR 100 UNIT/ML ~~LOC~~ SOLN: 10.0000 [IU] | Freq: Two times a day (BID) | SUBCUTANEOUS | 3 refills | Status: DC

## 2017-09-12 MED ORDER — NOVOLOG FLEXPEN 100 UNIT/ML ~~LOC~~ SOPN: PEN_INJECTOR | SUBCUTANEOUS | 2 refills | Status: DC

## 2017-09-12 MED ORDER — AMITRIPTYLINE HCL 25 MG PO TABS: 25.0000 mg | ORAL_TABLET | Freq: Every day | ORAL | 2 refills | Status: DC

## 2017-09-12 MED ORDER — FREESTYLE LIBRE SENSOR SYSTEM MISC: 0 refills | Status: DC

## 2017-09-12 NOTE — Telephone Encounter (Signed)
Advise on both questions please.

## 2017-09-12 NOTE — Addendum Note (Signed)
Addended by: Sharon Seller B on: 09/12/2017 10:41 AM   Modules accepted: Orders

## 2017-09-12 NOTE — Telephone Encounter (Signed)
Max units per day should be 80 for Novolog. I want 65 u twice daily, the 10 is an old rx and out of date. TY.

## 2017-09-12 NOTE — Telephone Encounter (Signed)
Attempted to telephone pharmacy for clarification-- but due to long hold time resent both with correct PCP instructions.

## 2017-09-12 NOTE — Progress Notes (Signed)
Pre visit review using our clinic review tool, if applicable. No additional management support is needed unless otherwise documented below in the visit note. 

## 2017-09-12 NOTE — Telephone Encounter (Signed)
Jonetta Osgood, pharmacy technician, from [facility] needs clarification for the following: 1.) Novolog flex pen 5 units plus sliding scale.  What is the maximum units per day? 2.) Levimir pen order clarification; one order says 10 units twice daily and the other says 65 units twice daily? The fax number is [phone] and phone number [phone]; will route to [place] for clarification of orders.

## 2017-09-12 NOTE — Progress Notes (Signed)
Subjective:   Chief Complaint  Patient presents with  . Follow-up    diabetes    [name] is a 32 y.o. female here for follow-up of diabetes, type 1.   [name] self monitored glucose range is > 200.  Patient denies hypoglycemic reactions. She checks her glucose levels 7times per day. Patient does require insulin. Levemir 60 u BID, Novolog around 20 u/meal Fasting sugars mid 200's. Post prandial sugars mid-200's, pre meal sugars around 350's.  Patient exercises 0 days per week on average.   Diet is OK. Interested in CGM and pump. Does not follow with endo.  Neuropathy continues. She has failed gabapentin and pregabalin. Started on Effexor around 7 mo ago, that was initially helpful, but not so anymore. Painful in both feet.   Past Medical History:  Diagnosis Date  . Chronic painful diabetic neuropathy ([place])   . Diabetes mellitus    Type 1  . History of chicken pox   . Neuropathy 07/2012    Past Surgical History:  Procedure Laterality Date  . CESAREAN SECTION     only one C/S per pt.  Marland Kitchen DILATION AND CURETTAGE OF UTERUS      Social History   Socioeconomic History  . Marital status: Married  Tobacco Use  . Smoking status: Never Smoker  . Smokeless tobacco: Never Used  Substance and Sexual Activity  . Alcohol use: No  . Drug use: Yes    Types: Marijuana  . Sexual activity: Yes    Birth control/protection: None   Current Outpatient Medications on File Prior to Visit  Medication Sig Dispense Refill  . doxylamine, Sleep, (SLEEP AID) 25 MG tablet Take 50 mg by mouth at bedtime as needed for sleep.    Marland Kitchen ibuprofen (ADVIL,MOTRIN) 800 MG tablet Take 1 tablet (800 mg total) by mouth every 8 (eight) hours as needed for fever. 30 tablet 2  . Insulin Pen Needle 31G X 5 MM MISC To use with insulin pens 100 each 1   Related testing: Foot exam(monofilament and inspection):done Date of retinal exam: 05/2017  Done by:  [facility] Pneumovax: Done, 2 years  ago Flu Shot: not done  Review of Systems: Eye:  No recent significant change in vision Pulmonary:  No SOB Cardiovascular:  No chest pain, no palpitations Skin/Integumentary ROS:  No abnormal skin lesions reported Neurologic:  No numbness, tingling  Objective:  BP 110/80 (BP Location: Left Arm, Patient Position: Sitting, Cuff Size: Normal)   Pulse (!) 107   Temp 98.6 F (37 C) (Oral)   Ht 5\' 7"  (1.702 m)   Wt 170 lb 6 oz (77.3 kg)   SpO2 97%   BMI 26.68 kg/m  General:  Well developed, well nourished, in no apparent distress Skin:  Warm, no pallor or diaphoresis Head:  Normocephalic, atraumatic Eyes:  Pupils equal and round, sclera anicteric without injection  Nose:  External nares without trauma, no discharge Throat/Pharynx:  Lips and gingiva without lesion Neck: Neck supple.  No obvious thyromegaly or masses.  No bruits Lungs:  clear to auscultation, breath sounds equal bilaterally, no wheezes, rales, or stridor Cardio:  regular rate and rhythm without murmurs, no bruits, no LE edema Neuro:  Sensation intact to pinprick on feet Psych: Age appropriate judgment and insight  Assessment:   Diabetic peripheral neuropathy associated with type 1 diabetes mellitus ([place]) - Plan: amitriptyline (ELAVIL) 25 MG tablet, dapagliflozin propanediol (FARXIGA) 5 MG TABS tablet, Ambulatory referral to Endocrinology, Lipid panel, Hemoglobin A1c, HM  Diabetes Foot Exam, NOVOLOG FLEXPEN 100 UNIT/ML FlexPen, insulin detemir (LEVEMIR) 100 UNIT/ML injection  Tachycardia - Plan: TSH, CBC  Need for influenza vaccination - Plan: Flu Vaccine QUAD 6+ mos PF IM (Fluarix Quad PF)   Plan:   Orders as above. Change Effexor to Elavil. Has failed Lyrica and gabapentin. Refer to endo for possible pump. Change insulin, 65 u Levemir BID, 5 units with meals +sliding scale. Add SGLT2. Will try CGM.  Counseled on diet and exercise.  F/u in 6 weeks. The patient voiced understanding and agreement to the  plan.  [place], DO 09/12/17 8:11 AM

## 2017-09-12 NOTE — Patient Instructions (Addendum)
Take 65 units of Levemir twice daily.  Take 5 units of Novolog with every meal + sliding scale.  Keep the diet clean.  Let us know if you need anything.  Healthy Eating Plan Many factors influence your heart health, including eating and exercise habits. Heart (coronary) risk increases with abnormal blood fat (lipid) levels. Heart-healthy meal planning includes limiting unhealthy fats, increasing healthy fats, and making other small dietary changes. This includes maintaining a healthy body weight to help keep lipid levels within a normal range.  WHAT IS MY PLAN?  Your health care provider recommends that you:  Drink a glass of water before meals to help with satiety.  Eat slowly.  An alternative to the water is to add Metamucil. This will help with satiety as well. It does contain calories, unlike water.  WHAT TYPES OF FAT SHOULD I CHOOSE?  Choose healthy fats more often. Choose monounsaturated and polyunsaturated fats, such as olive oil and canola oil, flaxseeds, walnuts, almonds, and seeds.  Eat more omega-3 fats. Good choices include salmon, mackerel, sardines, tuna, flaxseed oil, and ground flaxseeds. Aim to eat fish at least two times each week.  Avoid foods with partially hydrogenated oils in them. These contain trans fats. Examples of foods that contain trans fats are stick margarine, some tub margarines, cookies, crackers, and other baked goods. If you are going to avoid a fat, this is the one to avoid!  WHAT GENERAL GUIDELINES DO I NEED TO FOLLOW?  Check food labels carefully to identify foods with trans fats. Avoid these types of options when possible.  Fill one half of your plate with vegetables and green salads. Eat 4-5 servings of vegetables per day. A serving of vegetables equals 1 cup of raw leafy vegetables,  cup of raw or cooked cut-up vegetables, or  cup of vegetable juice.  Fill one fourth of your plate with whole grains. Look for the word "whole" as the first  word in the ingredient list.  Fill one fourth of your plate with lean protein foods.  Eat 4-5 servings of fruit per day. A serving of fruit equals one medium whole fruit,  cup of dried fruit,  cup of fresh, frozen, or canned fruit. Try to avoid fruits in cups/syrups as the sugar content can be high.  Eat more foods that contain soluble fiber. Examples of foods that contain this type of fiber are apples, broccoli, carrots, beans, peas, and barley. Aim to get 20-30 g of fiber per day.  Eat more home-cooked food and less restaurant, buffet, and fast food.  Limit or avoid alcohol.  Limit foods that are high in starch and sugar.  Avoid fried foods when able.  Cook foods by using methods other than frying. Baking, boiling, grilling, and broiling are all great options. Other fat-reducing suggestions include: ? Removing the skin from poultry. ? Removing all visible fats from meats. ? Skimming the fat off of stews, soups, and gravies before serving them. ? Steaming vegetables in water or broth.  Lose weight if you are overweight. Losing just 5-10% of your initial body weight can help your overall health and prevent diseases such as diabetes and heart disease.  Increase your consumption of nuts, legumes, and seeds to 4-5 servings per week. One serving of dried beans or legumes equals  cup after being cooked, one serving of nuts equals 1 ounces, and one serving of seeds equals  ounce or 1 tablespoon.  WHAT ARE GOOD FOODS CAN I EAT? Grains Grainy breads (  try to find bread that is 3 g of fiber per slice or greater), oatmeal, light popcorn. Whole-grain cereals. Rice and pasta, including brown rice and those that are made with whole wheat. Edamame pasta is a great alternative to grain pasta. It has a higher protein content. Try to avoid significant consumption of white bread, sugary cereals, or pastries/baked goods.  Vegetables All vegetables. Cooked white potatoes do not count as  vegetables.  Fruits All fruits, but limit pineapple and bananas as these fruits have a higher sugar content.  Meats and Other Protein Sources Lean, well-trimmed beef, veal, pork, and lamb. Chicken and [place] without skin. All fish and shellfish. Wild duck, rabbit, pheasant, and venison. Egg whites or low-cholesterol egg substitutes. Dried beans, peas, lentils, and tofu.Seeds and most nuts.  Dairy Low-fat or nonfat cheeses, including ricotta, string, and mozzarella. Skim or 1% milk that is liquid, powdered, or evaporated. Buttermilk that is made with low-fat milk. Nonfat or low-fat yogurt. Soy/Almond milk are good alternatives if you cannot handle dairy.  Beverages Water is the best for you. Sports drinks with less sugar are more desirable unless you are a highly active athlete.  Sweets and Desserts Sherbets and fruit ices. Honey, jam, marmalade, jelly, and syrups. Dark chocolate.  Eat all sweets and desserts in moderation.  Fats and Oils Nonhydrogenated (trans-free) margarines. Vegetable oils, including soybean, sesame, sunflower, olive, peanut, safflower, corn, canola, and cottonseed. Salad dressings or mayonnaise that are made with a vegetable oil. Limit added fats and oils that you use for cooking, baking, salads, and as spreads.  Other Cocoa powder. Coffee and tea. Most condiments.  The items listed above may not be a complete list of recommended foods or beverages. Contact your dietitian for more options.

## 2017-09-15 ENCOUNTER — Telehealth: Payer: Self-pay

## 2017-09-15 NOTE — Telephone Encounter (Signed)
Spoke w/ Rosendo Gros at [facility] [phone], received PA approval through 09/10/2018. [id number]. Pt's Medicaid ID: [id number] L.

## 2017-09-16 ENCOUNTER — Other Ambulatory Visit: Payer: Self-pay | Admitting: *Deleted

## 2017-09-16 NOTE — Telephone Encounter (Signed)
Opened in Error; PA information already documented in 09/15/17 phone note/SLS 01/29

## 2017-09-17 ENCOUNTER — Telehealth: Payer: Self-pay

## 2017-09-17 NOTE — Telephone Encounter (Signed)
Received PA request form from [facility]- form completed and faxed to [phone]. Awaiting determination.

## 2017-09-19 NOTE — Telephone Encounter (Signed)
Pt no longer active w/ [employer].

## 2017-09-29 ENCOUNTER — Encounter: Payer: Self-pay | Admitting: Family Medicine

## 2017-09-30 ENCOUNTER — Other Ambulatory Visit: Payer: Self-pay | Admitting: Family Medicine

## 2017-09-30 MED ORDER — FLUCONAZOLE 150 MG PO TABS: 150.0000 mg | ORAL_TABLET | Freq: Once | ORAL | 0 refills | Status: AC

## 2017-09-30 NOTE — Progress Notes (Signed)
Diflucan called in.

## 2017-10-10 ENCOUNTER — Encounter: Payer: Self-pay | Admitting: Family Medicine

## 2017-10-16 ENCOUNTER — Telehealth: Payer: Self-pay | Admitting: Family Medicine

## 2017-10-16 NOTE — Telephone Encounter (Signed)
Copied from [place]. Topic: Quick Communication - See Telephone Encounter >> Oct 16, 2017  8:08 AM Rosalin Hawking wrote: CRM for notification. See Telephone encounter for:  10/16/17.   Pt dropped off document to be filled out provider ([place] - 3 pages (1 is for providers info) Pt would like document to be faxed to [phone] and to call pt when document has been faxed, Pt's tel [phone]. Document put at front office tray under providers name.

## 2017-10-20 NOTE — Telephone Encounter (Signed)
Paperwork forwarded to provider for completion with patient instruction note attached/SLS 03/04

## 2017-10-24 ENCOUNTER — Encounter: Payer: Self-pay | Admitting: Family Medicine

## 2017-10-24 ENCOUNTER — Telehealth: Payer: Self-pay

## 2017-10-24 ENCOUNTER — Ambulatory Visit: Payer: BLUE CROSS/BLUE SHIELD | Admitting: Family Medicine

## 2017-10-24 ENCOUNTER — Telehealth: Payer: Self-pay | Admitting: Family Medicine

## 2017-10-24 VITALS — BP 110/80 | HR 68 | Temp 98.8°F | Ht 66.0 in | Wt 175.4 lb

## 2017-10-24 DIAGNOSIS — E1042 Type 1 diabetes mellitus with diabetic polyneuropathy: Secondary | ICD-10-CM

## 2017-10-24 DIAGNOSIS — K529 Noninfective gastroenteritis and colitis, unspecified: Secondary | ICD-10-CM

## 2017-10-24 MED ORDER — VENLAFAXINE HCL ER 75 MG PO CP24: 75.0000 mg | ORAL_CAPSULE | Freq: Every day | ORAL | 2 refills | Status: DC

## 2017-10-24 MED ORDER — ONDANSETRON HCL 4 MG/2ML IJ SOLN
4.0000 mg | Freq: Once | INTRAMUSCULAR | Status: AC
  Administered 2017-10-24: 4 mg via INTRAMUSCULAR

## 2017-10-24 MED ORDER — PREGABALIN 150 MG PO CAPS: 150.0000 mg | ORAL_CAPSULE | Freq: Two times a day (BID) | ORAL | 2 refills | Status: DC

## 2017-10-24 MED ORDER — INSULIN DETEMIR 100 UNIT/ML ~~LOC~~ SOLN: 70.0000 [IU] | Freq: Two times a day (BID) | SUBCUTANEOUS | 3 refills | Status: DC

## 2017-10-24 MED ORDER — INSULIN DETEMIR 100 UNIT/ML ~~LOC~~ SOLN: SUBCUTANEOUS | 3 refills | Status: DC

## 2017-10-24 MED ORDER — ONDANSETRON HCL 4 MG PO TABS: 4.0000 mg | ORAL_TABLET | Freq: Three times a day (TID) | ORAL | 0 refills | Status: DC | PRN

## 2017-10-24 NOTE — Progress Notes (Signed)
Pre visit review using our clinic review tool, if applicable. No additional management support is needed unless otherwise documented below in the visit note. 

## 2017-10-24 NOTE — Progress Notes (Signed)
Chief Complaint  Patient presents with  . Nausea  . Emesis     Subjective [name] is a 32 y.o. female who presents with vomiting and diarrhea Symptoms began 4 d ago. Patient has vomiting, diarrhea, headache and nausea Patient denies abdominal pain, cramping, fever, arthralgias, URI symptoms, recent travel, or antibiotic use Evaluation to date: no Sick contacts: none known   DM I, on Levemir 65 u BID (recently increased from 60 u bid), Novolog 5 u + SS (usually around 20 u per meal), and recently started on Farxiga 5 mg/d. She has noticed improvements of both fasting and post prandial sugars of high 100's of both respectively. Things got derailed when she started having a stomach bug. She is still having issues with neuropathy. She has tried capsicin cream, Lyrica, Neurontin, Effexor and most recently Elavil. None have been helpful, but Neurontin gave her adverse effects. She has never been on a combination of the above. It is affecting her sleep.   Past Medical History:  Diagnosis Date  . Chronic painful diabetic neuropathy ([place])   . Diabetes mellitus    Type 1  . History of chicken pox   . Neuropathy 07/2012   Past Surgical History:  Procedure Laterality Date  . CESAREAN SECTION     only one C/S per pt.  Marland Kitchen DILATION AND CURETTAGE OF UTERUS     Current Outpatient Medications on File Prior to Visit  Medication Sig Dispense Refill  . amitriptyline (ELAVIL) 25 MG tablet Take 1 tablet (25 mg total) by mouth at bedtime. 30 tablet 2  . Continuous Blood Gluc Receiver (FREESTYLE LIBRE READER) DEVI Inject 1 application as directed daily. Use as directed to check blood sugar.   DX E10.10 1 Device 0  . Continuous Blood Gluc Sensor (FREESTYLE LIBRE SENSOR SYSTEM) MISC Use as directed daily to check blood sugar. DX E10.10 1 each 0  . dapagliflozin propanediol (FARXIGA) 5 MG TABS tablet Take 5 mg by mouth daily. 30 tablet 2  . doxylamine, Sleep, (SLEEP AID) 25 MG tablet Take 50  mg by mouth at bedtime as needed for sleep.    Marland Kitchen ibuprofen (ADVIL,MOTRIN) 800 MG tablet Take 1 tablet (800 mg total) by mouth every 8 (eight) hours as needed for fever. 30 tablet 2  . insulin detemir (LEVEMIR) 100 UNIT/ML injection Inject 0.65 mLs (65 Units total) into the skin 2 (two) times daily. 65 units into skin twice daily. 10 mL 3  . Insulin Pen Needle 31G X 5 MM MISC To use with insulin pens 100 each 1  . NOVOLOG FLEXPEN 100 UNIT/ML FlexPen 5 units with meals plus sliding scale 15 mL 2   Allergies  Allergen Reactions  . Pramipexole Shortness Of Breath    Review of Systems Constitutional:  No fevers or chills Ear/Nose/Mouth/Throat:  No red eyes Gastrointestinal:  As noted in the HPI Musculoskeletal/Extremities: no myalgias Integumentary (Skin/Breast): no rash  Exam BP 110/80 (BP Location: Left Arm, Patient Position: Sitting, Cuff Size: Normal)   Pulse 68   Temp 98.8 F (37.1 C) (Oral)   Ht 5\' 6"  (1.676 m)   Wt 175 lb 6 oz (79.5 kg)   SpO2 98%   BMI 28.31 kg/m  General:  well developed, well hydrated, in no apparent distress Skin:  warm, no pallor or diaphoresis, no rashes Throat/Pharynx:  lips and gingiva without lesion; tongue and uvula midline; non-inflamed pharynx; no exudates or postnasal drainage Neck: neck supple without adenopathy, thyromegaly, or masses Lungs:  clear  to auscultation, breath sounds equal bilaterally, no respiratory distress, no wheezes Cardio:  regular rate and rhythm without murmurs Abdomen:  abdomen soft, nontender; bowel sounds normal; no masses or organomegaly Psych: Appropriate judgement/insight  Assessment and Plan  Diabetic peripheral neuropathy associated with type 1 diabetes mellitus ([place]) - Plan: insulin detemir (LEVEMIR) 100 UNIT/ML injection, NOVOLOG FLEXPEN 100 UNIT/ML FlexPen, venlafaxine XR (EFFEXOR XR) 75 MG 24 hr capsule, pregabalin (LYRICA) 150 MG capsule, Ambulatory referral to Neurology  Gastroenteritis - Plan: ondansetron  (ZOFRAN) 4 MG tablet, ondansetron (ZOFRAN) injection 4 mg  Orders as above. She is doing better overall, but not quite at goal just yet. Increase Levemir to 70 u BID and basal insulin 2 units from 5 to 7 units + sliding scale. Keep Wilder Glade the same. Counseled on diet and exercise. Change amitriptyline back to Effexor, but also add Lyrica. I will refer to Neurology for their opinion to help her control her neuropathy.  No red flag s/s's for GI illness. Zofran in office and for home. Keep pushing fluids w electrolytes.  F/u in 2 mo for DM visit. The patient voiced understanding and agreement to the plan.  [place], DO 10/24/17  8:22 AM

## 2017-10-24 NOTE — Telephone Encounter (Signed)
Spoke w/ Ulice Dash- informed that Pt to be on Levemir 65 units twice daily.

## 2017-10-24 NOTE — Telephone Encounter (Signed)
Hi Ms. [name], Just wanted to inform you that your Medical Accommodation Form was completed by provider and faxed successfully 10/22/17 to [place]. Thank You for choosing [facility] ! Best regards,  Fleet Contras, CMA AAMA  Sent via My Chart 10/23/17

## 2017-10-24 NOTE — Patient Instructions (Signed)
Keep drinking Pedialyte while you are vomiting or having diarrhea.  If you do not hear anything about your referral in the next 1-2 weeks, call our office and ask for an update.  We are increasing the dose of your insulin.  We are stopping amitriptyline and adding back 2 other meds for your neuropathy. Start using the capsicin cream again.   Let us know if you need anything.

## 2017-10-24 NOTE — Telephone Encounter (Signed)
Called into [place]- [phone]- spoke w/ Otila Kluver- tried initiated PA for medication but was informed that Pt is no longer active, was informed Pt will need to discuss w/ her case worker to determine why. Reference number for call: [id number]. Spoke w/ [place]- informed that Pt is no longer active w/ Medicaid- she will need to discuss w/ case worker unless she has other prescription insurance that can be ran. [facility] will discuss w/ Pt.

## 2017-10-24 NOTE — Telephone Encounter (Signed)
Copied from [place]. Topic: Quick Communication - See Telephone Encounter >> Oct 24, 2017  9:13 AM Corie Chiquito, NT wrote: CRM for notification.Ulice Dash is calling from the Pharmacy because he needs to know if the patient is supposed to [place] 70 units 2 times a day or 65 units 2 times a day for her Levemir Insulin. If someone could give him a call back about this att [phone]. [facility] [address] [place]  10/24/17.

## 2017-12-09 ENCOUNTER — Telehealth: Payer: Self-pay | Admitting: Family Medicine

## 2017-12-09 NOTE — Telephone Encounter (Signed)
Copied from [place] [id number]. Topic: Quick Communication - See Telephone Encounter >> Dec 09, 2017  8:29 AM Rosalin Hawking wrote: CRM for notification. See Telephone encounter for: 12/09/17.   Pt dropped off document to be filled out by provider ([place] pages and 1 written letter by pt) Pt would like document to be faxed to [phone] when ready. Document put at front office tray.

## 2017-12-10 NOTE — Telephone Encounter (Signed)
Forwarded to provider for completion/SLS 04/24

## 2017-12-30 ENCOUNTER — Other Ambulatory Visit: Payer: Self-pay | Admitting: Family Medicine

## 2017-12-31 ENCOUNTER — Ambulatory Visit: Payer: BLUE CROSS/BLUE SHIELD | Admitting: Family Medicine

## 2017-12-31 ENCOUNTER — Encounter: Payer: Self-pay | Admitting: Family Medicine

## 2017-12-31 DIAGNOSIS — Z0289 Encounter for other administrative examinations: Secondary | ICD-10-CM

## 2018-01-02 ENCOUNTER — Other Ambulatory Visit: Payer: Self-pay | Admitting: Family Medicine

## 2018-02-02 ENCOUNTER — Encounter: Payer: Self-pay | Admitting: Neurology

## 2018-02-02 ENCOUNTER — Ambulatory Visit: Payer: Self-pay | Admitting: Neurology

## 2018-02-02 ENCOUNTER — Telehealth: Payer: Self-pay | Admitting: *Deleted

## 2018-02-02 NOTE — Telephone Encounter (Signed)
No showed new patient appointment. 

## 2018-03-03 ENCOUNTER — Other Ambulatory Visit: Payer: Self-pay | Admitting: Family Medicine

## 2018-03-03 DIAGNOSIS — E1042 Type 1 diabetes mellitus with diabetic polyneuropathy: Secondary | ICD-10-CM

## 2018-04-14 ENCOUNTER — Other Ambulatory Visit: Payer: Self-pay | Admitting: Family Medicine

## 2018-04-22 ENCOUNTER — Ambulatory Visit: Payer: BLUE CROSS/BLUE SHIELD | Admitting: Family Medicine

## 2018-05-04 ENCOUNTER — Encounter: Payer: Self-pay | Admitting: Medical

## 2018-05-04 ENCOUNTER — Telehealth: Payer: Self-pay | Admitting: Medical

## 2018-05-04 ENCOUNTER — Ambulatory Visit: Payer: BLUE CROSS/BLUE SHIELD | Admitting: Medical

## 2018-05-04 VITALS — BP 112/78 | HR 99 | Temp 98.2°F | Resp 16 | Ht 67.0 in | Wt 159.8 lb

## 2018-05-04 DIAGNOSIS — G629 Polyneuropathy, unspecified: Secondary | ICD-10-CM | POA: Diagnosis not present

## 2018-05-04 DIAGNOSIS — E1149 Type 2 diabetes mellitus with other diabetic neurological complication: Secondary | ICD-10-CM

## 2018-05-04 DIAGNOSIS — R8290 Unspecified abnormal findings in urine: Secondary | ICD-10-CM

## 2018-05-04 DIAGNOSIS — M2550 Pain in unspecified joint: Secondary | ICD-10-CM

## 2018-05-04 DIAGNOSIS — R5383 Other fatigue: Secondary | ICD-10-CM

## 2018-05-04 DIAGNOSIS — K649 Unspecified hemorrhoids: Secondary | ICD-10-CM

## 2018-05-04 LAB — COMPREHENSIVE METABOLIC PANEL
ALBUMIN: 4.3 g/dL (ref 3.5–5.2)
ALT: 18 U/L (ref 0–35)
AST: 12 U/L (ref 0–37)
Alkaline Phosphatase: 106 U/L (ref 39–117)
BUN: 14 mg/dL (ref 6–23)
CALCIUM: 10.2 mg/dL (ref 8.4–10.5)
CHLORIDE: 94 meq/L — AB (ref 96–112)
CO2: 33 mEq/L — ABNORMAL HIGH (ref 19–32)
Creatinine, Ser: 0.86 mg/dL (ref 0.40–1.20)
GFR: 98.57 mL/min (ref >60.00)
Glucose, Bld: 455 mg/dL — ABNORMAL HIGH (ref 70–99)
POTASSIUM: 4.9 meq/L (ref 3.5–5.1)
SODIUM: 132 meq/L — AB (ref 135–145)
Total Bilirubin: 0.4 mg/dL (ref 0.2–1.2)
Total Protein: 7.2 g/dL (ref 6.0–8.3)

## 2018-05-04 LAB — POC URINALSYSI DIPSTICK (AUTOMATED)
BILIRUBIN UA: NEGATIVE
GLUCOSE UA: POSITIVE — AB
Leukocytes, UA: NEGATIVE
Nitrite, UA: NEGATIVE
Protein, UA: NEGATIVE
SPEC GRAV UA: 1.015 (ref 1.010–1.025)
UROBILINOGEN UA: NEGATIVE U/dL — AB
pH, UA: 6 (ref 5.0–8.0)

## 2018-05-04 LAB — SEDIMENTATION RATE: Sed Rate: 30 mm/hr — ABNORMAL HIGH (ref 0–20)

## 2018-05-04 LAB — VITAMIN D 25 HYDROXY (VIT D DEFICIENCY, FRACTURES): VITD: 7.98 ng/mL — AB (ref 30.00–100.00)

## 2018-05-04 LAB — HEMOGLOBIN A1C: HEMOGLOBIN A1C: 12.9 % — AB (ref 4.6–6.5)

## 2018-05-04 LAB — CBC WITH DIFFERENTIAL/PLATELET
BASOS PCT: 1 % (ref 0.0–3.0)
Basophils Absolute: 0.1 10*3/uL (ref 0.0–0.1)
EOS ABS: 0.2 10*3/uL (ref 0.0–0.7)
EOS PCT: 2.4 % (ref 0.0–5.0)
HCT: 40.1 % (ref 36.0–46.0)
Hemoglobin: 13.5 g/dL (ref 12.0–15.0)
LYMPHS ABS: 2.3 10*3/uL (ref 0.7–4.0)
Lymphocytes Relative: 33.5 % (ref 12.0–46.0)
MCHC: 33.7 g/dL (ref 30.0–36.0)
MCV: 92.9 fl (ref 78.0–100.0)
MONO ABS: 0.5 10*3/uL (ref 0.1–1.0)
Monocytes Relative: 7.3 % (ref 3.0–12.0)
Neutro Abs: 3.9 10*3/uL (ref 1.4–7.7)
Neutrophils Relative %: 55.8 % (ref 43.0–77.0)
Platelets: 335 10*3/uL (ref 150.0–400.0)
RBC: 4.32 Mil/uL (ref 3.87–5.11)
RDW: 13.6 % (ref 11.5–15.5)
WBC: 6.9 10*3/uL (ref 4.0–10.5)

## 2018-05-04 LAB — C-REACTIVE PROTEIN: CRP: 0.4 mg/dL — ABNORMAL LOW (ref 0.5–20.0)

## 2018-05-04 LAB — VITAMIN B12: VITAMIN B 12: 627 pg/mL (ref 211–911)

## 2018-05-04 MED ORDER — HYDROCORTISONE ACETATE 25 MG RE SUPP: 25.0000 mg | Freq: Two times a day (BID) | RECTAL | 0 refills | Status: DC

## 2018-05-04 MED ORDER — VITAMIN D (ERGOCALCIFEROL) 1.25 MG (50000 UNIT) PO CAPS: 50000.0000 [IU] | ORAL_CAPSULE | ORAL | 0 refills | Status: DC

## 2018-05-04 MED ORDER — VENLAFAXINE HCL ER 150 MG PO CP24: 150.0000 mg | ORAL_CAPSULE | Freq: Every day | ORAL | 1 refills | Status: DC

## 2018-05-04 MED ORDER — INSULIN PEN NEEDLE 31G X 5 MM MISC: 1 refills | Status: DC

## 2018-05-04 NOTE — Telephone Encounter (Signed)
Rx sent to pharmacy   

## 2018-05-04 NOTE — Telephone Encounter (Signed)
Pt wants prescription of regular insulin syringe needles.

## 2018-05-04 NOTE — Telephone Encounter (Signed)
Ergocalciferol sent to pt pharmacy.

## 2018-05-04 NOTE — Patient Instructions (Addendum)
For fatigue will get the listed labs today cbc, cmp, tsh, b12, b1, and vit D.  For hand/joint pains will get arthritis panel.  For odor to urine and some pain will get culture.  For diabetes will follow a1c then makes some adjustments on treatment. Also will send note to your pcp.  For neuropathy can increae venlafaxine to 150 mg.   For hemorrhoids I rx'd annusol hc supp.   Follow up 7-10 days or as needed

## 2018-05-04 NOTE — Progress Notes (Signed)
Subjective:    Patient ID: [name], female    DOB: [birth year], 32 y.o.   MRN: [id number]  HPI  Pt in states she has not UC recently for similar symptoms for which she was seen today.  Pt she has complaints of fatigue, leg pain/neuropathy and some hand  pains.   Pt has known high sugars. A1-c was 10.5. Pt states this morning sugar was 274. Pt is on novolog and levemir. But not on farxiga since she got yeast infection when she used that.  In addition reports some odor to urine recently as well as faint pain on urination. No fevers or chills.   Pt also reports close to end of interview chronic  some hemorrhoid that itches and burns. Hurts after she has bowel movement. But no severe pain presently.   Review of Systems  Constitutional: Positive for fatigue. Negative for chills and fever.  Respiratory: Negative for cough, chest tightness, shortness of breath and wheezing.   Cardiovascular: Negative for chest pain and palpitations.  Gastrointestinal: Negative for abdominal pain.  Endocrine: Positive for polyuria. Negative for polydipsia and polyphagia.  Genitourinary: Positive for dysuria. Negative for difficulty urinating, frequency, hematuria, pelvic pain and urgency.       Pt state her urine is cloudy and has slight smell to urine.  Musculoskeletal: Positive for arthralgias. Negative for back pain.  Neurological: Negative for seizures, syncope, facial asymmetry, weakness and light-headedness.  Hematological: Negative for adenopathy. Does not bruise/bleed easily.  Psychiatric/Behavioral: Negative for confusion and dysphoric mood.    Past Medical History:  Diagnosis Date  . Chronic painful diabetic neuropathy ([place])   . Diabetes mellitus    Type 1  . History of chicken pox   . Neuropathy 07/2012     Social History   Socioeconomic History  . Marital status: Married    Spouse name: Not on file  . Number of children: Not on file  . Years of education: Not on  file  . Highest education level: Not on file  Occupational History  . Not on file  Social Needs  . Financial resource strain: Not on file  . Food insecurity:    Worry: Not on file    Inability: Not on file  . Transportation needs:    Medical: Not on file    Non-medical: Not on file  Tobacco Use  . Smoking status: Never Smoker  . Smokeless tobacco: Never Used  Substance and Sexual Activity  . Alcohol use: No  . Drug use: Yes    Types: Marijuana  . Sexual activity: Yes    Birth control/protection: None  Lifestyle  . Physical activity:    Days per week: Not on file    Minutes per session: Not on file  . Stress: Not on file  Relationships  . Social connections:    Talks on phone: Not on file    Gets together: Not on file    Attends religious service: Not on file    Active member of club or organization: Not on file    Attends meetings of clubs or organizations: Not on file    Relationship status: Not on file  . Intimate partner violence:    Fear of current or ex partner: Not on file    Emotionally abused: Not on file    Physically abused: Not on file    Forced sexual activity: Not on file  Other Topics Concern  . Not on file  Social History Narrative  .  Not on file    Past Surgical History:  Procedure Laterality Date  . CESAREAN SECTION     only one C/S per pt.  Marland Kitchen DILATION AND CURETTAGE OF UTERUS      Family History  Problem Relation Age of Onset  . Hypertension Mother     Allergies  Allergen Reactions  . Pramipexole Shortness Of Breath    Current Outpatient Medications on File Prior to Visit  Medication Sig Dispense Refill  . Continuous Blood Gluc Receiver (FREESTYLE LIBRE 14 DAY READER) DEVI USE AS DIRECTED TO CHECK BLOOD SUGAR DAILY 1 Device 0  . Continuous Blood Gluc Sensor (FREESTYLE LIBRE 14 DAY SENSOR) MISC USE AS DIRECTED TO CHECK BLOOD SUGAR DAILY 1 each 0  . dapagliflozin propanediol (FARXIGA) 5 MG TABS tablet Take 5 mg by mouth daily. 30 tablet  2  . doxylamine, Sleep, (SLEEP AID) 25 MG tablet Take 50 mg by mouth at bedtime as needed for sleep.    Marland Kitchen ibuprofen (ADVIL,MOTRIN) 800 MG tablet Take 1 tablet (800 mg total) by mouth every 8 (eight) hours as needed for fever. 30 tablet 2  . insulin detemir (LEVEMIR) 100 UNIT/ML injection 65 units into skin twice daily. 10 mL 3  . Insulin Pen Needle 31G X 5 MM MISC To use with insulin pens 100 each 1  . NOVOLOG FLEXPEN 100 UNIT/ML FlexPen 7 units with meals plus sliding scale 15 mL 2  . ondansetron (ZOFRAN) 4 MG tablet Take 1 tablet (4 mg total) by mouth every 8 (eight) hours as needed. 20 tablet 0  . pregabalin (LYRICA) 150 MG capsule Take 1 capsule (150 mg total) by mouth 2 (two) times daily. 60 capsule 2  . venlafaxine XR (EFFEXOR-XR) 37.5 MG 24 hr capsule TAKE 1 CAPSULE DAILY WITH BREAKFAST 30 capsule 0  . venlafaxine XR (EFFEXOR-XR) 75 MG 24 hr capsule TAKE 1 CAPSULE BY MOUTH DAILY WITH BREAKFAST 30 capsule 0   No current facility-administered medications on file prior to visit.     BP 112/78   Pulse 99   Temp 98.2 F (36.8 C) (Oral)   Resp 16   Ht 5\' 7"  (1.702 m)   Wt 159 lb 12.8 oz (72.5 kg)   SpO2 100%   BMI 25.03 kg/m       Objective:   Physical Exam  General Mental Status- Alert. General Appearance- Not in acute distress.   Skin General: Color- Normal Color. Moisture- Normal Moisture.  Neck Carotid Arteries- Normal color. Moisture- Normal Moisture. No carotid bruits. No JVD.  Chest and Lung Exam Auscultation: Breath Sounds:-Normal.  Cardiovascular Auscultation:Rythm- Regular. Murmurs & Other Heart Sounds:Auscultation of the heart reveals- No Murmurs.  Abdomen Inspection:-Inspeection Normal. Palpation/Percussion:Note:No mass. Palpation and Percussion of the abdomen reveal- No  suprapubicTenderness, Non Distended + BS, no rebound or guarding.    Neurologic Cranial Nerve exam:- CN III-XII intact(No nystagmus), symmetric smile. Strength:- 5/5 equal and  symmetric strength both upper and lower extremities.  Back- no cva pain  Hands- on inspection joints don't appear swollen.     Assessment & Plan:  For fatigue will get the listed labs today cbc, cmp, tsh, b12, b1, and vit D.  For hand/joint pains will get arthritis panel.  For odor to urine and some pain will get culture.  For diabetes will follow a1c then makes some adjustments on treatment. Also will send note to your pcp.  For neuropathy can increae venlafaxine to 150 mg.   For hemorrhoids I rx'd anusol hc supp.  Follow up 7-10 days or as needed   [employer], [employer]

## 2018-05-05 ENCOUNTER — Telehealth: Payer: Self-pay | Admitting: Medical

## 2018-05-05 DIAGNOSIS — Z794 Long term (current) use of insulin: Principal | ICD-10-CM

## 2018-05-05 DIAGNOSIS — E1165 Type 2 diabetes mellitus with hyperglycemia: Secondary | ICD-10-CM

## 2018-05-05 NOTE — Telephone Encounter (Signed)
Last entry should read referral to endocrinologist placed.

## 2018-05-05 NOTE — Telephone Encounter (Signed)
Referral to neurologist placed. 

## 2018-05-06 ENCOUNTER — Telehealth: Payer: Self-pay | Admitting: Medical

## 2018-05-06 MED ORDER — NITROFURANTOIN MONOHYD MACRO 100 MG PO CAPS: 100.0000 mg | ORAL_CAPSULE | Freq: Two times a day (BID) | ORAL | 0 refills | Status: DC

## 2018-05-06 NOTE — Telephone Encounter (Signed)
Rx macrobid sent to pt pharmacy. 

## 2018-05-07 ENCOUNTER — Telehealth: Payer: Self-pay | Admitting: Medical

## 2018-05-07 LAB — URINE CULTURE
MICRO NUMBER: 91107424
SPECIMEN QUALITY:: ADEQUATE

## 2018-05-07 LAB — VITAMIN B1: Vitamin B1 (Thiamine): 11 nmol/L (ref 8–30)

## 2018-05-07 LAB — RHEUMATOID FACTOR: Rhuematoid fact SerPl-aCnc: <14 IU/mL (ref <14)

## 2018-05-07 LAB — ANA: ANA: NEGATIVE

## 2018-05-07 MED ORDER — "INSULIN SYRINGE 31G X 5/16"" 1 ML MISC": 0 refills | Status: DC

## 2018-05-07 MED ORDER — CIPROFLOXACIN HCL 500 MG PO TABS: 500.0000 mg | ORAL_TABLET | Freq: Two times a day (BID) | ORAL | 0 refills | Status: DC

## 2018-05-07 NOTE — Addendum Note (Signed)
Addended by: Sharon Seller B on: 05/07/2018 09:39 AM   Modules accepted: Orders

## 2018-05-07 NOTE — Telephone Encounter (Signed)
Prescription of Cipro sent to patient's pharmacy after reviewing sensitivity.  Sent my chart message and nurse call back message to start Cipro and stop Macrobid.

## 2018-05-07 NOTE — Telephone Encounter (Signed)
Syringes sent in.

## 2018-05-07 NOTE — Telephone Encounter (Signed)
Pt called stating that she had requested regular insulin syringe needles sent to pharmacy but instead the pen needles were sent. Pt would like the regular insulin syringes sent to pharmacy. Please advise.   [id number] [address], [place]. [phone] (Phone) [phone] (Fax)

## 2018-05-08 ENCOUNTER — Telehealth: Payer: Self-pay | Admitting: *Deleted

## 2018-05-08 NOTE — Telephone Encounter (Signed)
Received completed FMLA/STD paperwork from provider with request to attach Billing/charge sheet, completed as requested and faxed to [facility]/SLS 09/20

## 2018-05-27 ENCOUNTER — Telehealth: Payer: Self-pay | Admitting: *Deleted

## 2018-05-31 ENCOUNTER — Other Ambulatory Visit: Payer: Self-pay | Admitting: Medical

## 2018-06-04 ENCOUNTER — Telehealth: Payer: Self-pay | Admitting: *Deleted

## 2018-06-04 NOTE — Telephone Encounter (Signed)
Received Concurrent Disability and Leave/Statement of Incapacity/Attending Physician Statement [FMLA/STD] paperwork from [place] and [facility], completed as much as possible; forwarded to provider/SLS

## 2018-07-14 ENCOUNTER — Telehealth: Payer: Self-pay | Admitting: *Deleted

## 2018-07-14 NOTE — Telephone Encounter (Signed)
Received Concurrent Disability and Leave/Statement of Incapacity/Attending Physician Statement [FMLA/STD] paperwork from [place] and [facility], completed as much as possible; forwarded to provider/SLS 11/25  Per Dr. Nani Ravens, patient will need appointment to discuss Concurrent Disability and Leave with Statement of Incapacity. Would you please contact patient and have her schedule appointment with PCP to get her paperwork completed. Thanks/SLS 11/25

## 2018-07-14 NOTE — Telephone Encounter (Signed)
Called pt and scheduled appt for  07/29/18 for disability follow up.

## 2018-07-20 ENCOUNTER — Inpatient Hospital Stay (HOSPITAL_COMMUNITY)
Admission: EM | Admit: 2018-07-20 | Discharge: 2018-07-23 | DRG: 639 | Disposition: A | Discharge status: Home / Self Care | Payer: BLUE CROSS/BLUE SHIELD | Attending: Internal Medicine | Admitting: Internal Medicine

## 2018-07-20 ENCOUNTER — Other Ambulatory Visit: Payer: Self-pay

## 2018-07-20 ENCOUNTER — Encounter (HOSPITAL_COMMUNITY): Payer: Self-pay | Admitting: *Deleted

## 2018-07-20 DIAGNOSIS — R1110 Vomiting, unspecified: Secondary | ICD-10-CM | POA: Diagnosis not present

## 2018-07-20 DIAGNOSIS — E876 Hypokalemia: Secondary | ICD-10-CM | POA: Diagnosis not present

## 2018-07-20 DIAGNOSIS — R197 Diarrhea, unspecified: Secondary | ICD-10-CM

## 2018-07-20 DIAGNOSIS — E1042 Type 1 diabetes mellitus with diabetic polyneuropathy: Secondary | ICD-10-CM

## 2018-07-20 DIAGNOSIS — E1065 Type 1 diabetes mellitus with hyperglycemia: Secondary | ICD-10-CM | POA: Diagnosis present

## 2018-07-20 DIAGNOSIS — Z888 Allergy status to other drugs, medicaments and biological substances status: Secondary | ICD-10-CM

## 2018-07-20 DIAGNOSIS — Z79899 Other long term (current) drug therapy: Secondary | ICD-10-CM

## 2018-07-20 DIAGNOSIS — Z9111 Patient's noncompliance with dietary regimen: Secondary | ICD-10-CM

## 2018-07-20 DIAGNOSIS — E1040 Type 1 diabetes mellitus with diabetic neuropathy, unspecified: Secondary | ICD-10-CM | POA: Diagnosis present

## 2018-07-20 DIAGNOSIS — E1110 Type 2 diabetes mellitus with ketoacidosis without coma: Secondary | ICD-10-CM | POA: Diagnosis present

## 2018-07-20 DIAGNOSIS — E1310 Other specified diabetes mellitus with ketoacidosis without coma: Secondary | ICD-10-CM

## 2018-07-20 DIAGNOSIS — IMO0002 Reserved for concepts with insufficient information to code with codable children: Secondary | ICD-10-CM

## 2018-07-20 DIAGNOSIS — E1010 Type 1 diabetes mellitus with ketoacidosis without coma: Secondary | ICD-10-CM | POA: Diagnosis not present

## 2018-07-20 LAB — CBC
HCT: 44.2 % (ref 36.0–46.0)
Hemoglobin: 14.3 g/dL (ref 12.0–15.0)
MCH: 29.3 pg (ref 26.0–34.0)
MCHC: 32.4 g/dL (ref 30.0–36.0)
MCV: 90.6 fL (ref 80.0–100.0)
Platelets: 321 10*3/uL (ref 150–400)
RBC: 4.88 MIL/uL (ref 3.87–5.11)
RDW: 11.9 % (ref 11.5–15.5)
WBC: 8.8 10*3/uL (ref 4.0–10.5)
nRBC: 0 % (ref 0.0–0.2)

## 2018-07-20 LAB — BASIC METABOLIC PANEL
Anion gap: 19 — ABNORMAL HIGH (ref 5–15)
BUN: 5 mg/dL — ABNORMAL LOW (ref 6–20)
CO2: 19 mmol/L — AB (ref 22–32)
Calcium: 9.3 mg/dL (ref 8.9–10.3)
Chloride: 93 mmol/L — ABNORMAL LOW (ref 98–111)
Creatinine, Ser: 0.96 mg/dL (ref 0.44–1.00)
GFR calc Af Amer: >60 mL/min (ref >60)
GFR calc non Af Amer: >60 mL/min (ref >60)
Glucose, Bld: 427 mg/dL — ABNORMAL HIGH (ref 70–99)
Potassium: 3.9 mmol/L (ref 3.5–5.1)
Sodium: 131 mmol/L — ABNORMAL LOW (ref 135–145)

## 2018-07-20 LAB — URINALYSIS, ROUTINE W REFLEX MICROSCOPIC
Bilirubin Urine: NEGATIVE
Glucose, UA: >=500 mg/dL — AB
Hgb urine dipstick: NEGATIVE
Ketones, ur: 80 mg/dL — AB
Leukocytes, UA: NEGATIVE
Nitrite: NEGATIVE
PROTEIN: 100 mg/dL — AB
Specific Gravity, Urine: 1.032 — ABNORMAL HIGH (ref 1.005–1.030)
pH: 6 (ref 5.0–8.0)

## 2018-07-20 LAB — I-STAT VENOUS BLOOD GAS, ED
Acid-base deficit: 4 mmol/L — ABNORMAL HIGH (ref 0.0–2.0)
BICARBONATE: 21.5 mmol/L (ref 20.0–28.0)
O2 SAT: 23 %
TCO2: 23 mmol/L (ref 22–32)
pCO2, Ven: 40.3 mmHg — ABNORMAL LOW (ref 44.0–60.0)
pH, Ven: 7.335 (ref 7.250–7.430)
pO2, Ven: 17 mmHg — CL (ref 32.0–45.0)

## 2018-07-20 LAB — I-STAT BETA HCG BLOOD, ED (MC, WL, AP ONLY): I-stat hCG, quantitative: <5 m[IU]/mL (ref <5)

## 2018-07-20 LAB — CBG MONITORING, ED
Glucose-Capillary: 375 mg/dL — ABNORMAL HIGH (ref 70–99)
Glucose-Capillary: 396 mg/dL — ABNORMAL HIGH (ref 70–99)

## 2018-07-20 LAB — LIPASE, BLOOD: Lipase: 21 U/L (ref 11–51)

## 2018-07-20 LAB — I-STAT CG4 LACTIC ACID, ED: Lactic Acid, Venous: 1.84 mmol/L (ref 0.5–1.9)

## 2018-07-20 MED ORDER — SODIUM CHLORIDE 0.9 % IV SOLN: INTRAVENOUS | Status: DC

## 2018-07-20 MED ORDER — POTASSIUM CHLORIDE 10 MEQ/100ML IV SOLN
10.0000 meq | INTRAVENOUS | Status: AC
  Administered 2018-07-20 – 2018-07-21 (×2): 10 meq via INTRAVENOUS
  Filled 2018-07-20 (×2): qty 100

## 2018-07-20 MED ORDER — ONDANSETRON 4 MG PO TBDP
4.0000 mg | ORAL_TABLET | Freq: Once | ORAL | Status: AC | PRN
  Administered 2018-07-20: 4 mg via ORAL
  Filled 2018-07-20: qty 1

## 2018-07-20 MED ORDER — DEXTROSE-NACL 5-0.45 % IV SOLN: INTRAVENOUS | Status: DC

## 2018-07-20 MED ORDER — SODIUM CHLORIDE 0.9 % IV BOLUS
1000.0000 mL | Freq: Once | INTRAVENOUS | Status: AC
  Administered 2018-07-21: 1000 mL via INTRAVENOUS

## 2018-07-20 MED ORDER — METOCLOPRAMIDE HCL 5 MG/ML IJ SOLN
10.0000 mg | Freq: Once | INTRAMUSCULAR | Status: AC
  Administered 2018-07-20: 10 mg via INTRAVENOUS
  Filled 2018-07-20: qty 2

## 2018-07-20 MED ORDER — SODIUM CHLORIDE 0.9 % IV BOLUS
1000.0000 mL | Freq: Once | INTRAVENOUS | Status: AC
  Administered 2018-07-20: 1000 mL via INTRAVENOUS

## 2018-07-20 MED ORDER — INSULIN REGULAR(HUMAN) IN NACL 100-0.9 UT/100ML-% IV SOLN
INTRAVENOUS | Status: DC
  Administered 2018-07-21: 3.4 [IU]/h via INTRAVENOUS
  Filled 2018-07-20: qty 100

## 2018-07-20 NOTE — ED Provider Notes (Signed)
[facility] EMERGENCY DEPARTMENT Provider Note   CSN: [id number] Arrival date & time: 07/20/18  2158     History   Chief Complaint Chief Complaint  Patient presents with  . Emesis    HPI [name] is a 32 y.o. female.  32yo F w/ PMH including IDDM, diabetic neuropathy who p/w vomiting and hyperglycemia. Pt states that a few days ago she began having vomiting and diarrhea. She has had associated generalized weakness, body aches, and hyperglycemia with blood sugars in the 400s.  She reports compliance with her insulin and denies any recent changes to her medication regimen.  She has had mild cough but no other URI symptoms.  No urinary symptoms or vaginal bleeding/discharge.  No sick contacts.  No fevers.  She tried smoking marijuana this afternoon for the nausea but it did not help her symptoms.  The history is provided by the patient.  Emesis      Past Medical History:  Diagnosis Date  . Chronic painful diabetic neuropathy ([place])   . Diabetes mellitus    Type 1  . History of chicken pox   . Neuropathy 07/2012    Patient Active Problem List   Diagnosis Date Noted  . Diabetic peripheral neuropathy associated with type 1 diabetes mellitus ([place]) 11/09/2012  . 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 CESAREAN SECTION     only one C/S per pt.  Marland Kitchen DILATION AND CURETTAGE OF UTERUS       OB History    Gravida  4   Para  2   Term  2   Preterm      AB  2   Living  2     SAB      TAB  2   Ectopic      Multiple      Live Births               Home Medications    Prior to Admission medications   Medication Sig Start Date End Date Taking? Authorizing Provider  ciprofloxacin (CIPRO) 500 MG tablet Take 1 tablet (500 mg total) by mouth 2 (two) times daily. 05/07/18   Saguier, Percell Miller, PA-C  Continuous Blood  Gluc Receiver (FREESTYLE LIBRE 14 DAY READER) DEVI USE AS DIRECTED TO CHECK BLOOD SUGAR DAILY 12/30/17   Shelda Pal, DO  Continuous Blood Gluc Sensor (FREESTYLE LIBRE 14 DAY SENSOR) MISC USE AS DIRECTED TO CHECK BLOOD SUGAR DAILY 01/05/18   Nani Ravens, Crosby Oyster, DO  dapagliflozin propanediol (FARXIGA) 5 MG TABS tablet Take 5 mg by mouth daily. 09/12/17   Shelda Pal, DO  doxylamine, Sleep, (SLEEP AID) 25 MG tablet Take 50 mg by mouth at bedtime as needed for sleep.    [provider]  hydrocortisone (ANUSOL-HC) 25 MG suppository Place 1 suppository (25 mg total) rectally 2 (two) times daily. 05/04/18   Saguier, Percell Miller, PA-C  ibuprofen (ADVIL,MOTRIN) 800 MG tablet Take 1 tablet (800 mg total) by mouth every 8 (eight) hours as needed for fever. 11/15/16   Tamala Julian, [place], CNM  insulin detemir (LEVEMIR) 100 UNIT/ML injection 65 units into skin twice daily. 10/24/17   Shelda Pal, DO  Insulin Syringe-Needle U-100 (INSULIN SYRINGE 1CC/31GX5/16") 31G X 5/16" 1 ML MISC To use with insulin 05/07/18   Wendling, Crosby Oyster, DO  NOVOLOG FLEXPEN 100 UNIT/ML FlexPen 7 units  with meals plus sliding scale 10/24/17   Wendling, Crosby Oyster, DO  ondansetron (ZOFRAN) 4 MG tablet Take 1 tablet (4 mg total) by mouth every 8 (eight) hours as needed. 10/24/17   Shelda Pal, DO  pregabalin (LYRICA) 150 MG capsule Take 1 capsule (150 mg total) by mouth 2 (two) times daily. 10/24/17   Shelda Pal, DO  venlafaxine XR (EFFEXOR-XR) 150 MG 24 hr capsule TAKE 1 CAPSULE (150 MG TOTAL) BY MOUTH DAILY WITH BREAKFAST. 06/01/18   Shelda Pal, DO  Vitamin D, Ergocalciferol, (DRISDOL) 50000 units CAPS capsule Take 1 capsule (50,000 Units total) by mouth every 7 (seven) days. 05/04/18   Saguier, Percell Miller, PA-C    Family History Family History  Problem Relation Age of Onset  . Hypertension Mother     Social History Social History   Tobacco Use  . Smoking  status: Never Smoker  . Smokeless tobacco: Never Used  Substance Use Topics  . Alcohol use: No  . Drug use: Yes    Types: Marijuana     Allergies   Pramipexole   Review of Systems Review of Systems  Gastrointestinal: Positive for vomiting.     Physical Exam Updated Vital Signs BP 115/71   Pulse (!) 125   Temp 98.3 F (36.8 C) (Oral)   Resp 10   Ht 5\' 6"  (1.676 m)   Wt 70.3 kg   LMP 07/05/2018   SpO2 100%   BMI 25.02 kg/m   Physical Exam  Constitutional: She is oriented to person, place, and time. She appears well-developed and well-nourished.  Ill appearing but non-toxic, retching  HENT:  Head: Normocephalic and atraumatic.  dry mucous membranes  Eyes: Conjunctivae are normal.  Neck: Neck supple.  Cardiovascular: Normal rate, regular rhythm and normal heart sounds.  No murmur heard. Pulmonary/Chest: Effort normal and breath sounds normal.  Abdominal: Soft. Bowel sounds are normal. She exhibits no distension. There is no tenderness.  Musculoskeletal: She exhibits no edema.  Neurological: She is alert and oriented to person, place, and time.  Fluent speech  Skin: Skin is warm and dry.  Psychiatric:  Distressed, anxious  Nursing note and vitals reviewed.    ED Treatments / Results  Labs (all labs ordered are listed, but only abnormal results are displayed) Labs Reviewed  BASIC METABOLIC PANEL - Abnormal; Notable for the following components:      Result Value   Sodium 131 (*)    Chloride 93 (*)    CO2 19 (*)    Glucose, Bld 427 (*)    BUN 5 (*)    Anion gap 19 (*)    All other components within normal limits  URINALYSIS, ROUTINE W REFLEX MICROSCOPIC - Abnormal; Notable for the following components:   Specific Gravity, Urine 1.032 (*)    Glucose, UA >=500 (*)    Ketones, ur 80 (*)    Protein, ur 100 (*)    Bacteria, UA RARE (*)    All other components within normal limits  CBG MONITORING, ED - Abnormal; Notable for the following components:    Glucose-Capillary 375 (*)    All other components within normal limits  CBC  LIPASE, BLOOD  I-STAT BETA HCG BLOOD, ED (MC, WL, AP ONLY)  CBG MONITORING, ED  I-STAT VENOUS BLOOD GAS, ED  I-STAT CG4 LACTIC ACID, ED    EKG None  Radiology No results found.  Procedures .Critical Care Performed by: Sharlett Iles, MD Authorized by: Sharlett Iles, MD  Critical care provider statement:    Critical care time (minutes):  30   Critical care time was exclusive of:  Separately billable procedures and treating other patients   Critical care was necessary to treat or prevent imminent or life-threatening deterioration of the following conditions:  Endocrine crisis   Critical care was time spent personally by me on the following activities:  Development of treatment plan with patient or surrogate, evaluation of patient's response to treatment, examination of patient, obtaining history from patient or surrogate, ordering and performing treatments and interventions, ordering and review of laboratory studies, ordering and review of radiographic studies and review of old charts   (including critical care time)  Medications Ordered in ED Medications  insulin regular, human (MYXREDLIN) 100 units/ 100 mL infusion (has no administration in time range)  sodium chloride 0.9 % bolus 1,000 mL (has no administration in time range)    And  sodium chloride 0.9 % bolus 1,000 mL (has no administration in time range)    And  0.9 %  sodium chloride infusion (has no administration in time range)  dextrose 5 %-0.45 % sodium chloride infusion (has no administration in time range)  potassium chloride 10 mEq in 100 mL IVPB (has no administration in time range)  metoCLOPramide (REGLAN) injection 10 mg (has no administration in time range)  ondansetron (ZOFRAN-ODT) disintegrating tablet 4 mg (4 mg Oral Given 07/20/18 2230)     Initial Impression / Assessment and Plan / ED Course  I have reviewed  the triage vital signs and the nursing notes.  Pertinent labs & imaging results that were available during my care of the patient were reviewed by me and considered in my medical decision making (see chart for details).  Clinical Course as of Jul 21 146  Tue Jul 21, 2018  [phone]   [EH]    Clinical Course User Index [EH] Lorin Glass, PA-C   Pt retching on exam. Mild tachycardia but remainder of VS stable.  Lactate normal.  Lab work is consistent with DKA showing glucose 427, anion gap 19, urine ketones, venous pH 7.33.  CBC normal.  Gave Zofran, later Reglan.  Initiated DKA protocol with insulin drip and IV fluids.  Discussed admission with [facility], Dr. Myna Hidalgo, and pt admitted for further treatment.   Final Clinical Impressions(s) / ED Diagnoses   Final diagnoses:  None    ED Discharge Orders    None       Donis Pinder, Wenda Overland, MD 07/21/18 [phone]

## 2018-07-20 NOTE — ED Triage Notes (Signed)
Pt says she started vomiting over the weekend. She says she "feels weak, my mouth is fruity and dry, and I hurt all over" Hx of diabetes, sugars have been in the 400's, has been taking insulin as directed.

## 2018-07-21 ENCOUNTER — Encounter (HOSPITAL_COMMUNITY): Payer: Self-pay | Admitting: Family Medicine

## 2018-07-21 DIAGNOSIS — E1010 Type 1 diabetes mellitus with ketoacidosis without coma: Principal | ICD-10-CM

## 2018-07-21 DIAGNOSIS — E1110 Type 2 diabetes mellitus with ketoacidosis without coma: Secondary | ICD-10-CM | POA: Diagnosis present

## 2018-07-21 DIAGNOSIS — E1040 Type 1 diabetes mellitus with diabetic neuropathy, unspecified: Secondary | ICD-10-CM | POA: Diagnosis present

## 2018-07-21 DIAGNOSIS — Z9111 Patient's noncompliance with dietary regimen: Secondary | ICD-10-CM | POA: Diagnosis not present

## 2018-07-21 DIAGNOSIS — E876 Hypokalemia: Secondary | ICD-10-CM | POA: Diagnosis not present

## 2018-07-21 DIAGNOSIS — Z888 Allergy status to other drugs, medicaments and biological substances status: Secondary | ICD-10-CM | POA: Diagnosis not present

## 2018-07-21 DIAGNOSIS — Z79899 Other long term (current) drug therapy: Secondary | ICD-10-CM | POA: Diagnosis not present

## 2018-07-21 DIAGNOSIS — R1110 Vomiting, unspecified: Secondary | ICD-10-CM | POA: Diagnosis present

## 2018-07-21 LAB — BASIC METABOLIC PANEL
Anion gap: 15 (ref 5–15)
Anion gap: 8 (ref 5–15)
Anion gap: 9 (ref 5–15)
BUN: 8 mg/dL (ref 6–20)
BUN: 6 mg/dL (ref 6–20)
BUN: 5 mg/dL — ABNORMAL LOW (ref 6–20)
CALCIUM: 8.4 mg/dL — AB (ref 8.9–10.3)
CALCIUM: 8.3 mg/dL — AB (ref 8.9–10.3)
CO2: 19 mmol/L — ABNORMAL LOW (ref 22–32)
CO2: 23 mmol/L (ref 22–32)
CO2: 24 mmol/L (ref 22–32)
Calcium: 8.1 mg/dL — ABNORMAL LOW (ref 8.9–10.3)
Chloride: 100 mmol/L (ref 98–111)
Chloride: 105 mmol/L (ref 98–111)
Chloride: 104 mmol/L (ref 98–111)
Creatinine, Ser: 0.93 mg/dL (ref 0.44–1.00)
Creatinine, Ser: 0.78 mg/dL (ref 0.44–1.00)
Creatinine, Ser: 0.64 mg/dL (ref 0.44–1.00)
GFR calc Af Amer: >60 mL/min (ref >60)
GFR calc Af Amer: >60 mL/min (ref >60)
GFR calc Af Amer: >60 mL/min (ref >60)
GFR calc non Af Amer: >60 mL/min (ref >60)
GFR calc non Af Amer: >60 mL/min (ref >60)
GFR calc non Af Amer: >60 mL/min (ref >60)
Glucose, Bld: 327 mg/dL — ABNORMAL HIGH (ref 70–99)
Glucose, Bld: 197 mg/dL — ABNORMAL HIGH (ref 70–99)
Glucose, Bld: 122 mg/dL — ABNORMAL HIGH (ref 70–99)
Potassium: 4.4 mmol/L (ref 3.5–5.1)
Potassium: 3.8 mmol/L (ref 3.5–5.1)
Potassium: 3.8 mmol/L (ref 3.5–5.1)
Sodium: 134 mmol/L — ABNORMAL LOW (ref 135–145)
Sodium: 136 mmol/L (ref 135–145)
Sodium: 137 mmol/L (ref 135–145)

## 2018-07-21 LAB — HEPATIC FUNCTION PANEL
ALT: 15 U/L (ref 0–44)
AST: 13 U/L — AB (ref 15–41)
Albumin: 4 g/dL (ref 3.5–5.0)
Alkaline Phosphatase: 70 U/L (ref 38–126)
Bilirubin, Direct: 0.1 mg/dL (ref 0.0–0.2)
Indirect Bilirubin: 1.1 mg/dL — ABNORMAL HIGH (ref 0.3–0.9)
Total Bilirubin: 1.2 mg/dL (ref 0.3–1.2)
Total Protein: 7.5 g/dL (ref 6.5–8.1)

## 2018-07-21 LAB — CBG MONITORING, ED
GLUCOSE-CAPILLARY: 163 mg/dL — AB (ref 70–99)
GLUCOSE-CAPILLARY: 95 mg/dL (ref 70–99)
Glucose-Capillary: 296 mg/dL — ABNORMAL HIGH (ref 70–99)
Glucose-Capillary: 310 mg/dL — ABNORMAL HIGH (ref 70–99)
Glucose-Capillary: 281 mg/dL — ABNORMAL HIGH (ref 70–99)
Glucose-Capillary: 229 mg/dL — ABNORMAL HIGH (ref 70–99)
Glucose-Capillary: 193 mg/dL — ABNORMAL HIGH (ref 70–99)
Glucose-Capillary: 140 mg/dL — ABNORMAL HIGH (ref 70–99)
Glucose-Capillary: 170 mg/dL — ABNORMAL HIGH (ref 70–99)
Glucose-Capillary: 155 mg/dL — ABNORMAL HIGH (ref 70–99)
Glucose-Capillary: 117 mg/dL — ABNORMAL HIGH (ref 70–99)

## 2018-07-21 LAB — HIV ANTIBODY (ROUTINE TESTING W REFLEX): HIV Screen 4th Generation wRfx: NONREACTIVE

## 2018-07-21 LAB — GLUCOSE, CAPILLARY
GLUCOSE-CAPILLARY: 97 mg/dL (ref 70–99)
Glucose-Capillary: 126 mg/dL — ABNORMAL HIGH (ref 70–99)

## 2018-07-21 MED ORDER — INSULIN DETEMIR 100 UNIT/ML ~~LOC~~ SOLN
20.0000 [IU] | Freq: Once | SUBCUTANEOUS | Status: AC
  Administered 2018-07-22: 20 [IU] via SUBCUTANEOUS
  Filled 2018-07-21: qty 0.2

## 2018-07-21 MED ORDER — INSULIN ASPART 100 UNIT/ML ~~LOC~~ SOLN
0.0000 [IU] | Freq: Three times a day (TID) | SUBCUTANEOUS | Status: DC
  Administered 2018-07-21 – 2018-07-22 (×2): 1 [IU] via SUBCUTANEOUS
  Administered 2018-07-22: 2 [IU] via SUBCUTANEOUS

## 2018-07-21 MED ORDER — INSULIN DETEMIR 100 UNIT/ML ~~LOC~~ SOLN
40.0000 [IU] | Freq: Two times a day (BID) | SUBCUTANEOUS | Status: DC
  Administered 2018-07-21: 40 [IU] via SUBCUTANEOUS
  Filled 2018-07-21 (×3): qty 0.4

## 2018-07-21 MED ORDER — SODIUM CHLORIDE 0.9 % IV SOLN
INTRAVENOUS | Status: DC
  Administered 2018-07-21 – 2018-07-22 (×3): via INTRAVENOUS

## 2018-07-21 MED ORDER — SODIUM CHLORIDE 0.9% FLUSH: 3.0000 mL | Freq: Two times a day (BID) | INTRAVENOUS | Status: DC

## 2018-07-21 MED ORDER — DEXTROSE-NACL 5-0.45 % IV SOLN
INTRAVENOUS | Status: DC
  Administered 2018-07-21: 04:00:00 via INTRAVENOUS

## 2018-07-21 MED ORDER — SODIUM CHLORIDE 0.9 % IV SOLN
INTRAVENOUS | Status: DC
  Administered 2018-07-21: 01:00:00 via INTRAVENOUS

## 2018-07-21 MED ORDER — INSULIN REGULAR(HUMAN) IN NACL 100-0.9 UT/100ML-% IV SOLN: INTRAVENOUS | Status: DC

## 2018-07-21 MED ORDER — SODIUM CHLORIDE 0.9 % IV SOLN: 250.0000 mL | INTRAVENOUS | Status: DC | PRN

## 2018-07-21 MED ORDER — BUTALBITAL-APAP-CAFFEINE 50-325-40 MG PO TABS
1.0000 | ORAL_TABLET | Freq: Four times a day (QID) | ORAL | Status: DC | PRN
  Administered 2018-07-21: 2 via ORAL
  Filled 2018-07-21: qty 2

## 2018-07-21 MED ORDER — POTASSIUM CHLORIDE 10 MEQ/100ML IV SOLN: 10.0000 meq | INTRAVENOUS | Status: DC

## 2018-07-21 MED ORDER — INSULIN ASPART 100 UNIT/ML ~~LOC~~ SOLN
5.0000 [IU] | Freq: Three times a day (TID) | SUBCUTANEOUS | Status: DC
  Administered 2018-07-21 – 2018-07-22 (×3): 5 [IU] via SUBCUTANEOUS

## 2018-07-21 MED ORDER — SODIUM CHLORIDE 0.9% FLUSH: 3.0000 mL | INTRAVENOUS | Status: DC | PRN

## 2018-07-21 MED ORDER — ONDANSETRON HCL 4 MG/2ML IJ SOLN
4.0000 mg | Freq: Four times a day (QID) | INTRAMUSCULAR | Status: DC | PRN
  Administered 2018-07-21 – 2018-07-23 (×3): 4 mg via INTRAVENOUS
  Filled 2018-07-21 (×3): qty 2

## 2018-07-21 MED ORDER — VENLAFAXINE HCL ER 75 MG PO CP24
150.0000 mg | ORAL_CAPSULE | Freq: Every day | ORAL | Status: DC
  Administered 2018-07-21 – 2018-07-23 (×3): 150 mg via ORAL
  Filled 2018-07-21: qty 2
  Filled 2018-07-21: qty 1
  Filled 2018-07-21: qty 2

## 2018-07-21 MED ORDER — ENOXAPARIN SODIUM 40 MG/0.4ML ~~LOC~~ SOLN
40.0000 mg | SUBCUTANEOUS | Status: DC
  Administered 2018-07-21 – 2018-07-22 (×2): 40 mg via SUBCUTANEOUS
  Filled 2018-07-21 (×3): qty 0.4

## 2018-07-21 NOTE — ED Notes (Signed)
Insulin drip and d5 1/2normal stopped per protocol.

## 2018-07-21 NOTE — Progress Notes (Signed)
[place] TEAM 1 - Stepdown/ICU TEAM  Dorrie Cocuzza McAdoo[place]Collier  LZJ:[id number] DOB: Jan 09, 1986 DOA: 07/20/2018 PCP: Shelda Pal, DO    Brief Narrative:  32 y.o. female w/ a hx of poorly controlled DM with neuropathy who presented to the ED w/ abdominal discomfort, nausea, and nonbloody vomiting. Patient reported she has been using her insulin as directed, but CBG had been in the 400s for a couple days.   In the ED the patient was found to be afebrile, saturating well on room air, tachycardic in the 120s, and with stable blood pressure. Chemistry panel was notable for glucose 427, anion gap 19, and bicarbonate 19.   Significant Events: 12/3 admit   Subjective: Pt is seen for a f/u visit.    Assessment & Plan:  DKA in uncontrolled DM1 managed at home with Levemir 65 units BID and Novolog 7 units TID - DM Coordinator assisting w/ care   DVT prophylaxis: lovenox  Code Status: FULL CODE Family Communication:  Disposition Plan: anticipate d/c home 12/4 if CBG remains stable   Consultants:  none  Antimicrobials:  none   Objective: Blood pressure 90/62, pulse (!) 112, temperature 97.8 F (36.6 C), resp. rate 17, height 5\' 6"  (1.676 m), weight 69.9 kg, last menstrual period 07/05/2018, SpO2 98 %. No intake or output data in the 24 hours ending 07/21/18 1712 Filed Weights   07/20/18 2206 07/21/18 1530  Weight: 70.3 kg 69.9 kg    Examination: Pt was seen for a f/u visit.    CBC: Recent Labs  Lab 07/20/18 2215  WBC 8.8  HGB 14.3  HCT 44.2  MCV 90.6  PLT 024   Basic Metabolic Panel: Recent Labs  Lab 07/20/18 2215 07/21/18 0117 07/21/18 0451 07/21/18 0843  NA 131* 134* 136 137  K 3.9 4.4 3.8 3.8  CL 93* 100 105 104  CO2 19* 19* 23 24  GLUCOSE 427* 327* 197* 122*  BUN 5* 8 6 5*  CREATININE 0.96 0.93 0.78 0.64  CALCIUM 9.3 8.4* 8.1* 8.3*   GFR: Estimated Creatinine Clearance: 95.4 mL/min (by C-G formula based on SCr of 0.64 mg/dL).  Liver  Function Tests: Recent Labs  Lab 07/20/18 2215  AST 13*  ALT 15  ALKPHOS 70  BILITOT 1.2  PROT 7.5  ALBUMIN 4.0   Recent Labs  Lab 07/20/18 2217  LIPASE 21    HbA1C: Hgb A1c MFr Bld  Date/Time Value Ref Range Status  05/04/2018 09:20 AM 12.9 (H) 4.6 - 6.5 % Final    Comment:    Glycemic Control Guidelines for People with Diabetes:Non Diabetic:  <6%Goal of Therapy: <7%Additional Action Suggested:  >8%   09/12/2017 07:40 AM 10.5 (H) 4.6 - 6.5 % Final    Comment:    Glycemic Control Guidelines for People with Diabetes:Non Diabetic:  <6%Goal of Therapy: <7%Additional Action Suggested:  >8%     CBG: Recent Labs  Lab 07/21/18 0643 07/21/18 0756 07/21/18 0918 07/21/18 1047 07/21/18 1131  GLUCAP 140* 95 170* 155* 117*     Scheduled Meds: . enoxaparin (LOVENOX) injection  40 mg Subcutaneous Q24H  . insulin aspart  0-9 Units Subcutaneous TID WC  . insulin aspart  5 Units Subcutaneous TID WC  . insulin detemir  40 Units Subcutaneous BID  . sodium chloride flush  3 mL Intravenous Q12H  . venlafaxine XR  150 mg Oral Q breakfast     LOS: 0 days   Time spent: No Charge  Cherene Altes, MD [facility] Office  [phone] Pager - Text Page per Shea Evans as per below:  On-Call/Text Page:      [email or web address]  If 7PM-7AM, please contact night-coverage [email or web address] 07/21/2018, 5:12 PM

## 2018-07-21 NOTE — H&P (Signed)
History and Physical    [name] HAL:[id number] DOB: 22-Jul-1986 DOA: 07/20/2018  PCP: Shelda Pal, DO   Patient coming from: Home   Chief Complaint: Abd discomfort, N/V, high CBG's   HPI: [name] is a 32 y.o. female with medical history significant for poorly controlled tydiabetes mellitus with neuropathy, now presenting to the emergency department for evaluation of abdominal discomfort with nausea and nonbloody vomiting.  Patient reports that she has been using her insulin as directed, but CBG has been in the 400s for the past couple days with intermittent cramping and generalized abdominal discomfort, nausea, and nonbloody vomiting.  She denies any fevers, chills, shortness of breath, cough, rhinorrhea, or dysuria.  She reports experiencing similar symptoms previously when her sugars are uncontrolled.  No chest pain, palpitations, change in vision or hearing, or focal numbness or weakness.  ED Course: Upon arrival to the ED, patient is found to be afebrile, saturating well on room air, tachycardic in the 120s, and with stable blood pressure.  EKG features sinus tachycardia with rate 129.  Chemistry panel is notable for a sodium of 131, glucose 427, anion gap 19, and bicarbonate 19.  CBC is unremarkable.  Lactic acid is normal.  Urinalysis features glucosuria and ketonuria.  Patient was given 2 L of normal saline, 20 mEq IV potassium, and started on insulin infusion in the ED.  She will be observed for ongoing evaluation and management of mild DKA.  Review of Systems:  All other systems reviewed and apart from HPI, are negative.  Past Medical History:  Diagnosis Date  . Chronic painful diabetic neuropathy ([name])   . Diabetes mellitus    Type 1  . History of chicken pox   . Neuropathy 07/2012    Past Surgical History:  Procedure Laterality Date  . CESAREAN SECTION     only one C/S per pt.  Marland Kitchen DILATION AND CURETTAGE OF UTERUS       reports  that she has never smoked. She has never used smokeless tobacco. She reports that she has current or past drug history. Drug: Marijuana. She reports that she does not drink alcohol.  Allergies  Allergen Reactions  . Pramipexole Shortness Of Breath    Family History  Problem Relation Age of Onset  . Hypertension Mother      Prior to Admission medications   Medication Sig Start Date End Date Taking? Authorizing Provider  ciprofloxacin (CIPRO) 500 MG tablet Take 1 tablet (500 mg total) by mouth 2 (two) times daily. 05/07/18   Saguier, Percell Miller, PA-C  Continuous Blood Gluc Receiver (FREESTYLE LIBRE 14 DAY READER) DEVI USE AS DIRECTED TO CHECK BLOOD SUGAR DAILY 12/30/17   Shelda Pal, DO  Continuous Blood Gluc Sensor (FREESTYLE LIBRE 14 DAY SENSOR) MISC USE AS DIRECTED TO CHECK BLOOD SUGAR DAILY 01/05/18   Nani Ravens, Crosby Oyster, DO  dapagliflozin propanediol (FARXIGA) 5 MG TABS tablet Take 5 mg by mouth daily. 09/12/17   Shelda Pal, DO  doxylamine, Sleep, (SLEEP AID) 25 MG tablet Take 50 mg by mouth at bedtime as needed for sleep.    [provider]  hydrocortisone (ANUSOL-HC) 25 MG suppository Place 1 suppository (25 mg total) rectally 2 (two) times daily. 05/04/18   Saguier, Percell Miller, PA-C  ibuprofen (ADVIL,MOTRIN) 800 MG tablet Take 1 tablet (800 mg total) by mouth every 8 (eight) hours as needed for fever. 11/15/16   Tamala Julian, [place], CNM  insulin detemir (LEVEMIR) 100 UNIT/ML injection 65 units into skin  twice daily. 10/24/17   Shelda Pal, DO  Insulin Syringe-Needle U-100 (INSULIN SYRINGE 1CC/31GX5/16") 31G X 5/16" 1 ML MISC To use with insulin 05/07/18   Wendling, Crosby Oyster, DO  NOVOLOG FLEXPEN 100 UNIT/ML FlexPen 7 units with meals plus sliding scale 10/24/17   Wendling, Crosby Oyster, DO  ondansetron (ZOFRAN) 4 MG tablet Take 1 tablet (4 mg total) by mouth every 8 (eight) hours as needed. 10/24/17   Shelda Pal, DO  pregabalin (LYRICA) 150  MG capsule Take 1 capsule (150 mg total) by mouth 2 (two) times daily. 10/24/17   Shelda Pal, DO  venlafaxine XR (EFFEXOR-XR) 150 MG 24 hr capsule TAKE 1 CAPSULE (150 MG TOTAL) BY MOUTH DAILY WITH BREAKFAST. 06/01/18   Shelda Pal, DO  Vitamin D, Ergocalciferol, (DRISDOL) 50000 units CAPS capsule Take 1 capsule (50,000 Units total) by mouth every 7 (seven) days. 05/04/18   Mackie Pai, PA-C    Physical Exam: Vitals:   07/20/18 2207 07/20/18 2328 07/20/18 2345 07/21/18 0030  BP: 109/84 115/71 126/81 113/77  Pulse: (!) 125  (!) 113 (!) 109  Resp: (!) 22 10 19 18   Temp: 98.3 F (36.8 C)     TempSrc: Oral     SpO2: 100%  100% 100%  Weight:      Height:        Constitutional: NAD, calm  Eyes: PERTLA, lids and conjunctivae normal ENMT: Mucous membranes are moist. Posterior pharynx clear of any exudate or lesions.   Neck: normal, supple, no masses, no thyromegaly Respiratory: clear to auscultation bilaterally, no wheezing, no crackles. Normal respiratory effort.   Cardiovascular: Rate ~110 and regular. No extremity edema.   Abdomen: No distension, no tenderness, soft. Bowel sounds normal.  Musculoskeletal: no clubbing / cyanosis. No joint deformity upper and lower extremities. Normal muscle tone.  Skin: no significant rashes, lesions, ulcers. Warm, dry, well-perfused. Neurologic: No facial asymmetry. Sensation intact. Moving all extremities.  Psychiatric: Alert and oriented x 3. Calm, cooperative.    Labs on Admission: I have personally reviewed following labs and imaging studies  CBC: Recent Labs  Lab 07/20/18 2215  WBC 8.8  HGB 14.3  HCT 44.2  MCV 90.6  PLT 427   Basic Metabolic Panel: Recent Labs  Lab 07/20/18 2215  NA 131*  K 3.9  CL 93*  CO2 19*  GLUCOSE 427*  BUN 5*  CREATININE 0.96  CALCIUM 9.3   GFR: Estimated Creatinine Clearance: 79.5 mL/min (by C-G formula based on SCr of 0.96 mg/dL). Liver Function Tests: No results for  input(s): AST, ALT, ALKPHOS, BILITOT, PROT, ALBUMIN in the last 168 hours. Recent Labs  Lab 07/20/18 2217  LIPASE 21   No results for input(s): AMMONIA in the last 168 hours. Coagulation Profile: No results for input(s): INR, PROTIME in the last 168 hours. Cardiac Enzymes: No results for input(s): CKTOTAL, CKMB, CKMBINDEX, TROPONINI in the last 168 hours. BNP (last 3 results) No results for input(s): PROBNP in the last 8760 hours. HbA1C: No results for input(s): HGBA1C in the last 72 hours. CBG: Recent Labs  Lab 07/20/18 2205 07/20/18 2357  GLUCAP 375* 396*   Lipid Profile: No results for input(s): CHOL, HDL, LDLCALC, TRIG, CHOLHDL, LDLDIRECT in the last 72 hours. Thyroid Function Tests: No results for input(s): TSH, T4TOTAL, FREET4, T3FREE, THYROIDAB in the last 72 hours. Anemia Panel: No results for input(s): VITAMINB12, FOLATE, FERRITIN, TIBC, IRON, RETICCTPCT in the last 72 hours. Urine analysis:    Component Value Date/Time  COLORURINE YELLOW 07/20/2018 [place] 07/20/2018 2213   LABSPEC 1.032 (H) 07/20/2018 2213   PHURINE 6.0 07/20/2018 2213   GLUCOSEU >=500 (A) 07/20/2018 2213   HGBUR NEGATIVE 07/20/2018 2213   BILIRUBINUR NEGATIVE 07/20/2018 2213   BILIRUBINUR Negative 05/04/2018 0838   KETONESUR 80 (A) 07/20/2018 2213   PROTEINUR 100 (A) 07/20/2018 2213   UROBILINOGEN negative (A) 05/04/2018 0838   UROBILINOGEN 0.2 05/05/2017 1824   NITRITE NEGATIVE 07/20/2018 2213   LEUKOCYTESUR NEGATIVE 07/20/2018 2213   Sepsis Labs: @LABRCNTIP (procalcitonin:4,lacticidven:4) )No results found for this or any previous visit (from the past 240 hour(s)).   Radiological Exams on Admission: No results found.  EKG: Independently reviewed. Sinus tachycardia (rate 129).   Assessment/Plan   1. DKA; uncontrolled type I DM  - Presents with generalized abdominal discomfort, N/V, and uncontrolled blood glucose  - Abd exam is benign  - A1c was 12.9% in  September  - Managed at home with Levemir 65 units BID and Novolog 7 units TID  - There is metabolic acidosis and ketonuria noted in ED, she fluid-resuscitated with NS, and started on insulin infusion  - Continue IVF hydration, continue insulin infusion with frequent CBG's and serial chem panels    DVT prophylaxis: Lovenox Code Status: Full  Family Communication: Discussed with patient  Consults called: None Admission status: Observation    Vianne Bulls, MD [facility] Pager [phone]  If 7PM-7AM, please contact night-coverage [email or web address] Password [email or web address]  07/21/2018, 1:08 AM

## 2018-07-21 NOTE — Progress Notes (Signed)
Patient's cbg is 16 tonight- notified NP to lower Levemir so patient does not bottom out in the morning- pt agrees.

## 2018-07-21 NOTE — ED Notes (Signed)
Pt given po fluids and tolerated well.

## 2018-07-21 NOTE — Progress Notes (Addendum)
Inpatient Diabetes Program Recommendations  AACE/ADA: New Consensus Statement on Inpatient Glycemic Control (2015)  Target Ranges:  Prepandial:   less than 140 mg/dL      Peak postprandial:   less than 180 mg/dL (1-2 hours)      Critically ill patients:  140 - 180 mg/dL   Lab Results  Component Value Date   GLUCAP 117 (H) 07/21/2018   HGBA1C 12.9 (H) 05/04/2018    Review of Glycemic ControlResults for [name], [name] (MRN [id number]) as of 07/21/2018 12:54  Ref. Range 07/21/2018 10:47 07/21/2018 11:31  Glucose-Capillary Latest Ref Range: 70 - 99 mg/dL 155 (H) 117 (H)    Diabetes history: Type 1 DM- per patient for past 10 years Outpatient Diabetes medications:  Levemir 65 units bid, Novolog 7 units tid with meals Current orders for Inpatient glycemic control:  Levemir 40 units bid, Novolog sensitive tid with meals  Inpatient Diabetes Program Recommendations:    Spoke with patient regarding DM.  She states that she plans to f/u with her PCP on 12/11 to discuss referral to endocrinologist. We discussed her last A1C of 12.9% and the dangers of having elevated blood sugars.  She works and states that it is very difficult for her to have time to check her blood sugars in the mornings due to being on the phone.  She did have the Freestyle LIbre- CGM but states that her insurance stopped covering.  She is interested in getting an insulin pump (states she had a Medtronic pump in the past).  We discussed various pumps including Medtronic, Tandem and Omnipod.  She is interested in learning more about all of them.  Encouraged her to begin caring for herself to prevent complications.  She is on a very high dose of Levemir.  She states that she often wakes up high.  We discuss somogyi effect of having low blood sugar in the middle of the night and waking high.  Encouraged patient to check blood sugar around 2-3 AM to make sure that she is not dropping.   Please consider adding Novolog meal  coverage 5 units tid with meals.  Will follow.  Will place referral for outpatient DM f/u due to patient's interest in insulin pumps and uncontrolled DM- per protocol/cosign required.   Thanks , Adah Perl, RN, BC-ADM Inpatient Diabetes Coordinator Pager [phone] (8a-5p)

## 2018-07-22 LAB — GLUCOSE, CAPILLARY
Glucose-Capillary: 67 mg/dL — ABNORMAL LOW (ref 70–99)
Glucose-Capillary: 54 mg/dL — ABNORMAL LOW (ref 70–99)
Glucose-Capillary: 102 mg/dL — ABNORMAL HIGH (ref 70–99)
Glucose-Capillary: 181 mg/dL — ABNORMAL HIGH (ref 70–99)
Glucose-Capillary: 141 mg/dL — ABNORMAL HIGH (ref 70–99)
Glucose-Capillary: 78 mg/dL (ref 70–99)
Glucose-Capillary: 204 mg/dL — ABNORMAL HIGH (ref 70–99)

## 2018-07-22 LAB — COMPREHENSIVE METABOLIC PANEL
ALT: 13 U/L (ref 0–44)
ANION GAP: 12 (ref 5–15)
AST: 14 U/L — ABNORMAL LOW (ref 15–41)
Albumin: 2.9 g/dL — ABNORMAL LOW (ref 3.5–5.0)
Alkaline Phosphatase: 47 U/L (ref 38–126)
BUN: <5 mg/dL — ABNORMAL LOW (ref 6–20)
CALCIUM: 8.4 mg/dL — AB (ref 8.9–10.3)
CO2: 24 mmol/L (ref 22–32)
Chloride: 104 mmol/L (ref 98–111)
Creatinine, Ser: 0.73 mg/dL (ref 0.44–1.00)
GFR calc Af Amer: >60 mL/min (ref >60)
Glucose, Bld: 98 mg/dL (ref 70–99)
Potassium: 3.2 mmol/L — ABNORMAL LOW (ref 3.5–5.1)
Sodium: 140 mmol/L (ref 135–145)
Total Bilirubin: 0.4 mg/dL (ref 0.3–1.2)
Total Protein: 5.7 g/dL — ABNORMAL LOW (ref 6.5–8.1)

## 2018-07-22 MED ORDER — ZOLPIDEM TARTRATE 5 MG PO TABS
5.0000 mg | ORAL_TABLET | Freq: Once | ORAL | Status: AC
  Administered 2018-07-22: 5 mg via ORAL
  Filled 2018-07-22: qty 1

## 2018-07-22 MED ORDER — INSULIN DETEMIR 100 UNIT/ML ~~LOC~~ SOLN
10.0000 [IU] | Freq: Every day | SUBCUTANEOUS | Status: DC
  Administered 2018-07-22: 10 [IU] via SUBCUTANEOUS
  Filled 2018-07-22: qty 0.1

## 2018-07-22 MED ORDER — INSULIN DETEMIR 100 UNIT/ML ~~LOC~~ SOLN
20.0000 [IU] | Freq: Every day | SUBCUTANEOUS | Status: DC
  Administered 2018-07-22 – 2018-07-23 (×2): 20 [IU] via SUBCUTANEOUS
  Filled 2018-07-22 (×2): qty 0.2

## 2018-07-22 MED ORDER — POTASSIUM CHLORIDE CRYS ER 20 MEQ PO TBCR
40.0000 meq | EXTENDED_RELEASE_TABLET | ORAL | Status: AC
  Administered 2018-07-22 (×2): 40 meq via ORAL
  Filled 2018-07-22 (×2): qty 2

## 2018-07-22 NOTE — Progress Notes (Signed)
Hypoglycemic Event  CBG: 67  Treatment: 15 GM carbohydrate snack  Symptoms: None  Follow-up CBG:   [id number] CBG Result:54  Possible Reasons for Event: Medication regimen: Levemir 20units  Comments/MD notified:    Wylene Men

## 2018-07-22 NOTE — Progress Notes (Signed)
Hypoglycemic Event  CBG: 54  Treatment: 15 GM carbohydrate snack  Symptoms: Sweaty (overnight per pt)  Follow-up CBG: [id number] CBG Result:102  Possible Reasons for Event: Medication regimen: Levemir 20units hs  Comments/MD notified:    Joaquim Tolen N

## 2018-07-22 NOTE — Progress Notes (Signed)
[name]  MWN:[id number] DOB: 1985-10-25 DOA: 07/20/2018 PCP: Shelda Pal, DO    Brief Narrative:  32 y.o. female w/ a hx of poorly controlled DM with neuropathy who presented to the ED w/ abdominal discomfort, nausea, and nonbloody vomiting. Patient reported she has been using her insulin as directed, but CBG had been in the 400s for a couple days.   In the ED the patient was found to be afebrile, saturating well on room air, tachycardic in the 120s, and with stable blood pressure. Chemistry panel was notable for glucose 427, anion gap 19, and bicarbonate 19.   Significant Events: 12/3 admit   Subjective: She denies any complaints today  Assessment & Plan:  DKA in uncontrolled DM1 -Embolic acidosis, ketonuria on admission, started on insulin drip, IV fluids, her anion gap closed, transition to oral regimen as she is tolerating oral, patient on 65 units Levemir twice daily, A1c was 12.9 couple months ago, actually she is hypoglycemic this morning despite receiving only 40 and 20 of Levemir yesterday, I have lowered her Levemir to 20 units daily and 10 units nightly, continue with insulin sliding scale  Hypokalemia -repleted, recheck in am  DVT prophylaxis: lovenox  Code Status: FULL CODE Family Communication:  Disposition Plan: home when stable, she still having glycemia this morning at 54, will need further monitoring and adjustment of her Levemir regimen for safe discharge as an outpatient.  Consultants:  none  Antimicrobials:  none   Objective: Blood pressure 111/85, pulse 91, temperature (!) 97.5 F (36.4 C), temperature source Oral, resp. rate 17, height 5\' 6"  (1.676 m), weight 69.9 kg, last menstrual period 07/05/2018, SpO2 100 %.  Intake/Output Summary (Last 24 hours) at 07/22/2018 1423 Last data filed at 07/22/2018 0647 Gross per 24 hour  Intake 810.4 ml  Output 0 ml  Net 810.4 ml   Filed Weights   07/20/18 2206 07/21/18 1530  Weight:  70.3 kg 69.9 kg    Examination:  Awake Alert, Oriented X 3, No new F.N deficits, Normal affect Symmetrical Chest wall movement, Good air movement bilaterally, CTAB RRR,No Gallops,Rubs or new Murmurs, No Parasternal Heave +ve B.Sounds, Abd Soft, No tenderness, No rebound - guarding or rigidity. No Cyanosis, Clubbing or edema, No new Rash or bruise     CBC: Recent Labs  Lab 07/20/18 2215  WBC 8.8  HGB 14.3  HCT 44.2  MCV 90.6  PLT 403   Basic Metabolic Panel: Recent Labs  Lab 07/20/18 2215 07/21/18 0117 07/21/18 0451 07/21/18 0843 07/22/18 0326  NA 131* 134* 136 137 140  K 3.9 4.4 3.8 3.8 3.2*  CL 93* 100 105 104 104  CO2 19* 19* 23 24 24   GLUCOSE 427* 327* 197* 122* 98  BUN 5* 8 6 5* <5*  CREATININE 0.96 0.93 0.78 0.64 0.73  CALCIUM 9.3 8.4* 8.1* 8.3* 8.4*   GFR: Estimated Creatinine Clearance: 95.4 mL/min (by C-G formula based on SCr of 0.73 mg/dL).  Liver Function Tests: Recent Labs  Lab 07/20/18 2215 07/22/18 0326  AST 13* 14*  ALT 15 13  ALKPHOS 70 47  BILITOT 1.2 0.4  PROT 7.5 5.7*  ALBUMIN 4.0 2.9*   Recent Labs  Lab 07/20/18 2217  LIPASE 21    HbA1C: Hgb A1c MFr Bld  Date/Time Value Ref Range Status  05/04/2018 09:20 AM 12.9 (H) 4.6 - 6.5 % Final    Comment:    Glycemic Control Guidelines for People with Diabetes:Non Diabetic:  <6%Goal of Therapy: <  7%Additional Action Suggested:  >8%   09/12/2017 07:40 AM 10.5 (H) 4.6 - 6.5 % Final    Comment:    Glycemic Control Guidelines for People with Diabetes:Non Diabetic:  <6%Goal of Therapy: <7%Additional Action Suggested:  >8%     CBG: Recent Labs  Lab 07/22/18 0620 07/22/18 0652 07/22/18 0732 07/22/18 0843 07/22/18 1201  GLUCAP 67* 54* 102* 181* 141*     Scheduled Meds: . enoxaparin (LOVENOX) injection  40 mg Subcutaneous Q24H  . insulin aspart  0-9 Units Subcutaneous TID WC  . insulin detemir  10 Units Subcutaneous QHS  . insulin detemir  20 Units Subcutaneous Daily  .  venlafaxine XR  150 mg Oral Q breakfast     LOS: 1 day    Phillips Climes, MD [facility] Office  [phone] Pager - Text Page per Amion as per below:  On-Call/Text Page:      [email or web address]  If 7PM-7AM, please contact night-coverage [email or web address] 07/22/2018, 2:23 PM

## 2018-07-23 LAB — GLUCOSE, CAPILLARY
Glucose-Capillary: 66 mg/dL — ABNORMAL LOW (ref 70–99)
Glucose-Capillary: 72 mg/dL (ref 70–99)

## 2018-07-23 LAB — BASIC METABOLIC PANEL
Anion gap: 8 (ref 5–15)
BUN: <5 mg/dL — ABNORMAL LOW (ref 6–20)
CO2: 25 mmol/L (ref 22–32)
Calcium: 8.3 mg/dL — ABNORMAL LOW (ref 8.9–10.3)
Chloride: 108 mmol/L (ref 98–111)
Creatinine, Ser: 0.6 mg/dL (ref 0.44–1.00)
GFR calc Af Amer: >60 mL/min (ref >60)
GFR calc non Af Amer: >60 mL/min (ref >60)
Glucose, Bld: 86 mg/dL (ref 70–99)
POTASSIUM: 3.7 mmol/L (ref 3.5–5.1)
Sodium: 141 mmol/L (ref 135–145)

## 2018-07-23 MED ORDER — INSULIN DETEMIR 100 UNIT/ML ~~LOC~~ SOLN: SUBCUTANEOUS | 3 refills | Status: DC

## 2018-07-23 NOTE — Progress Notes (Signed)
CBG this AM was 66; patient alert and oriented, no discomfort noted, no complaints. Patient received 240 ml orange juice and CBG came up to 72. At this time patient is eating her breakfast. MD was notified. Will continue to monitor.

## 2018-07-23 NOTE — Progress Notes (Signed)
Patient was discharged home by MD order; discharged instructions  review and give to patient with care notes and prescription; IV DIC; skin intact; patient will be escorted to the car by a volunteer via wheelchair.  

## 2018-07-23 NOTE — Discharge Summary (Signed)
[name], is a 32 y.o. female  DOB 10-Jul-1986  MRN [id number].  Admission date:  07/20/2018  Admitting Physician  Cherene Altes, MD  Discharge Date:  07/23/2018   Primary MD  Shelda Pal, DO  Recommendations for primary care physician for things to follow:  -Check CBC, BMP during next visit   Admission Diagnosis  Vomiting and diarrhea [R11.10, R19.7] Diabetic ketoacidosis without coma associated with other specified diabetes mellitus ([place]) [E13.10] Uncontrolled type 1 diabetes mellitus with diabetic neuropathy, with long-term current use of insulin (HCC) [E10.40, E10.65] DKA, type 1 ([place]) [E10.10]   Discharge Diagnosis  Vomiting and diarrhea [R11.10, R19.7] Diabetic ketoacidosis without coma associated with other specified diabetes mellitus ([place]) [E13.10] Uncontrolled type 1 diabetes mellitus with diabetic neuropathy, with long-term current use of insulin ([place]) [E10.40, E10.65] DKA, type 1 ([place]) [E10.10]    Principal Problem:   DKA (diabetic ketoacidoses) ([place]) Active Problems:   Uncontrolled type 1 diabetes mellitus with diabetic neuropathy, with long-term current use of insulin ([place] [address] [place])   DKA, type 1 ([place])      Past Medical History:  Diagnosis Date  . Chronic painful diabetic neuropathy ([place])   . Diabetes mellitus    Type 1  . History of chicken pox   . Neuropathy 07/2012    Past Surgical History:  Procedure Laterality Date  . CESAREAN SECTION     only one C/S per pt.  Marland Kitchen DILATION AND CURETTAGE OF UTERUS         History of present illness and  Hospital Course:     Kindly see H&P for history of present illness and admission details, please review complete Labs, Consult reports and Test reports for all details in brief  HPI  from the history and physical done on the day of admission 07/20/2018  HPI: [name] is a 32 y.o. female with  medical history significant for poorly controlled tydiabetes mellitus with neuropathy, now presenting to the emergency department for evaluation of abdominal discomfort with nausea and nonbloody vomiting.  Patient reports that she has been using her insulin as directed, but CBG has been in the 400s for the past couple days with intermittent cramping and generalized abdominal discomfort, nausea, and nonbloody vomiting.  She denies any fevers, chills, shortness of breath, cough, rhinorrhea, or dysuria.  She reports experiencing similar symptoms previously when her sugars are uncontrolled.  No chest pain, palpitations, change in vision or hearing, or focal numbness or weakness.  ED Course: Upon arrival to the ED, patient is found to be afebrile, saturating well on room air, tachycardic in the 120s, and with stable blood pressure.  EKG features sinus tachycardia with rate 129.  Chemistry panel is notable for a sodium of 131, glucose 427, anion gap 19, and bicarbonate 19.  CBC is unremarkable.  Lactic acid is normal.  Urinalysis features glucosuria and ketonuria.  Patient was given 2 L of normal saline, 20 mEq IV potassium, and started on insulin infusion in the ED.  She will be  observed for ongoing evaluation and management of mild DKA.   Hospital Course   32 y.o.femalew/ a hx of poorly controlled DM with neuropathy who presented to the ED w/ abdominal discomfort, nausea, and nonbloody vomiting. Patient reported she has been using her insulin as directed, but CBG had been in the 400s for a couple days.   In the ED the patient was found to be afebrile, saturating well on room air, tachycardic in the 120s, and with stable blood pressure. Chemistry panel was notable for glucose 427, anion gap 19, and bicarbonate 19.    DKA in uncontrolled DM1 -Presents with double leg acidosis, ketonuria on admission, started on insulin drip, IV fluids, her anion gap closed, transition to subcutaneous regimen as she is  tolerating oral intake, diabetes mellitus overall is poorly controlled with A1c of 12.9 couple months ago , She is currently on 65 units Levemir twice daily, with recurrent hypoglycemia during hospital stay on much lower insulin regimen, and reports she is compliant with her medication at home, I have explained for her most likely reason for such elevated A1c and such high requirement of Levemir his diet noncompliance, I have counseled at length with her, with recurrent hypoglycemia in early morning, very likely she is having Somogyi effect, received 20 daytime and 10 evening time of Levemir yesterday, despite that she is approximately this morning, be discharged on Levemir 20 every morning, and 5 every afternoon, follow with her PCP regarding further adjustment if needed.  Hypokalemia -repleted, potassium is 3.7 on discharge  Discharge Condition:  Stable   Follow UP  Follow-up Information    Shelda Pal, DO Follow up in 1 week(s).   Specialty:  Family Medicine Contact information: [place] [address] [phone]             Discharge Instructions  and  Discharge Medications    Discharge Instructions    Ambulatory referral to Nutrition and Diabetic Education   Complete by:  As directed    Patient has Type 1 DM- she is interested in learning more about insulin pumps- will need 1:1   Discharge instructions   Complete by:  As directed    Follow with Primary MD Shelda Pal, DO in 7 days   Get CBC, CMP,checked  by Primary MD next visit.    Activity: As tolerated with Full fall precautions use walker/cane & assistance as needed   Disposition Home    Diet: CARB MODIFIED , with feeding assistance and aspiration precautions.   On your next visit with your primary care physician please Get Medicines reviewed and adjusted.   Please request your Prim.MD to go over all Hospital Tests and Procedure/Radiological results at  the follow up, please get all Hospital records sent to your Prim MD by signing hospital release before you go home.   If you experience worsening of your admission symptoms, develop shortness of breath, life threatening emergency, suicidal or homicidal thoughts you must seek medical attention immediately by calling 911 or calling your MD immediately  if symptoms less severe.  You Must read complete instructions/literature along with all the possible adverse reactions/side effects for all the Medicines you take and that have been prescribed to you. Take any new Medicines after you have completely understood and accpet all the possible adverse reactions/side effects.   Do not drive, operating heavy machinery, perform activities at heights, swimming or participation in water activities or provide baby sitting services if your  were admitted for syncope or siezures until you have seen by Primary MD or a Neurologist and advised to do so again.  Do not drive when taking Pain medications.    Do not take more than prescribed Pain, Sleep and Anxiety Medications  Special Instructions: If you have smoked or chewed Tobacco  in the last 2 yrs please stop smoking, stop any regular Alcohol  and or any Recreational drug use.  Wear Seat belts while driving.   Please note  You were cared for by a hospitalist during your hospital stay. If you have any questions about your discharge medications or the care you received while you were in the hospital after you are discharged, you can call the unit and asked to speak with the hospitalist on call if the hospitalist that took care of you is not available. Once you are discharged, your primary care physician will handle any further medical issues. Please note that NO REFILLS for any discharge medications will be authorized once you are discharged, as it is imperative that you return to your primary care physician (or establish a relationship with a primary care physician  if you do not have one) for your aftercare needs so that they can reassess your need for medications and monitor your lab values.   Increase activity slowly   Complete by:  As directed      Allergies as of 07/23/2018      Reactions   Pramipexole Shortness Of Breath      Medication List    TAKE these medications   FREESTYLE LIBRE 14 DAY READER Devi USE AS DIRECTED TO CHECK BLOOD SUGAR DAILY   FREESTYLE LIBRE 14 DAY SENSOR Misc USE AS DIRECTED TO CHECK BLOOD SUGAR DAILY   insulin detemir 100 UNIT/ML injection Commonly known as:  LEVEMIR 20 units every morning, 5 units every evening What changed:  additional instructions   INSULIN SYRINGE 1CC/31GX5/16" 31G X 5/16" 1 ML Misc To use with insulin   NOVOLOG FLEXPEN 100 UNIT/ML FlexPen Generic drug:  insulin aspart Inject 7 Units into the skin 3 (three) times daily with meals. plus sliding scale   SLEEP AID 25 MG tablet Generic drug:  doxylamine (Sleep) Take 50 mg by mouth at bedtime as needed for sleep.   venlafaxine XR 150 MG 24 hr capsule Commonly known as:  EFFEXOR-XR TAKE 1 CAPSULE (150 MG TOTAL) BY MOUTH DAILY WITH BREAKFAST.   Vitamin D (Ergocalciferol) 1.25 MG (50000 UT) Caps capsule Commonly known as:  DRISDOL Take 1 capsule (50,000 Units total) by mouth every 7 (seven) days.         Diet and Activity recommendation: See Discharge Instructions above   Consults obtained -  None   Major procedures and Radiology Reports - PLEASE review detailed and final reports for all details, in brief -      No results found.  Micro Results     No results found for this or any previous visit (from the past 240 hour(s)).     Today   Subjective:   [name] today has no headache,no chest abdominal pain,no new weakness tingling or numbness, feels much better wants to go home today.  She was hypoglycemic this morning at 66, she denies any symptoms.  Objective:   Blood pressure 125/89, pulse 83,  temperature 98.8 F (37.1 C), temperature source Oral, resp. rate 18, height 5\' 6"  (1.676 m), weight 69.9 kg, last menstrual period 07/05/2018, SpO2 100 %.   Intake/Output Summary (Last 24  hours) at 07/23/2018 5009 Last data filed at 07/23/2018 0827 Gross per 24 hour  Intake 2576.72 ml  Output -  Net 2576.72 ml    Exam Awake Alert, Oriented x 3, No new F.N deficits, Normal affect  Symmetrical Chest wall movement, Good air movement bilaterally, CTAB RRR,No Gallops,Rubs or new Murmurs, No Parasternal Heave +ve B.Sounds, Abd Soft, Non tender, No organomegaly appriciated, No rebound -guarding or rigidity. No Cyanosis, Clubbing or edema, No new Rash or bruise  Data Review   CBC w Diff:  Lab Results  Component Value Date   WBC 8.8 07/20/2018   HGB 14.3 07/20/2018   HCT 44.2 07/20/2018   PLT 321 07/20/2018   LYMPHOPCT 33.5 05/04/2018   MONOPCT 7.3 05/04/2018   EOSPCT 2.4 05/04/2018   BASOPCT 1.0 05/04/2018    CMP:  Lab Results  Component Value Date   NA 141 07/23/2018   K 3.7 07/23/2018   CL 108 07/23/2018   CO2 25 07/23/2018   BUN <5 (L) 07/23/2018   CREATININE 0.60 07/23/2018   PROT 5.7 (L) 07/22/2018   ALBUMIN 2.9 (L) 07/22/2018   BILITOT 0.4 07/22/2018   ALKPHOS 47 07/22/2018   AST 14 (L) 07/22/2018   ALT 13 07/22/2018  .   Total Time in preparing paper work, data evaluation and todays exam - 71 minutes  Phillips Climes M.D on 07/23/2018 at [place]  [phone]

## 2018-07-23 NOTE — Discharge Instructions (Signed)
Follow with Primary MD Shelda Pal, DO in 7 days   Get CBC, CMP,checked  by Primary MD next visit.    Activity: As tolerated with Full fall precautions use walker/cane & assistance as needed   Disposition Home    Diet: CARB MODIFIED , with feeding assistance and aspiration precautions.   On your next visit with your primary care physician please Get Medicines reviewed and adjusted.   Please request your Prim.MD to go over all Hospital Tests and Procedure/Radiological results at the follow up, please get all Hospital records sent to your Prim MD by signing hospital release before you go home.   If you experience worsening of your admission symptoms, develop shortness of breath, life threatening emergency, suicidal or homicidal thoughts you must seek medical attention immediately by calling 911 or calling your MD immediately  if symptoms less severe.  You Must read complete instructions/literature along with all the possible adverse reactions/side effects for all the Medicines you take and that have been prescribed to you. Take any new Medicines after you have completely understood and accpet all the possible adverse reactions/side effects.   Do not drive, operating heavy machinery, perform activities at heights, swimming or participation in water activities or provide baby sitting services if your were admitted for syncope or siezures until you have seen by Primary MD or a Neurologist and advised to do so again.  Do not drive when taking Pain medications.    Do not take more than prescribed Pain, Sleep and Anxiety Medications  Special Instructions: If you have smoked or chewed Tobacco  in the last 2 yrs please stop smoking, stop any regular Alcohol  and or any Recreational drug use.  Wear Seat belts while driving.   Please note  You were cared for by a hospitalist during your hospital stay. If you have any questions about your discharge medications or the care you  received while you were in the hospital after you are discharged, you can call the unit and asked to speak with the hospitalist on call if the hospitalist that took care of you is not available. Once you are discharged, your primary care physician will handle any further medical issues. Please note that NO REFILLS for any discharge medications will be authorized once you are discharged, as it is imperative that you return to your primary care physician (or establish a relationship with a primary care physician if you do not have one) for your aftercare needs so that they can reassess your need for medications and monitor your lab values.

## 2018-07-29 ENCOUNTER — Ambulatory Visit: Payer: Medicare Other | Admitting: Family Medicine

## 2018-07-29 DIAGNOSIS — Z0289 Encounter for other administrative examinations: Secondary | ICD-10-CM

## 2018-08-05 ENCOUNTER — Ambulatory Visit: Payer: BLUE CROSS/BLUE SHIELD | Admitting: *Deleted

## 2018-08-31 ENCOUNTER — Other Ambulatory Visit: Payer: Self-pay | Admitting: Family Medicine

## 2018-08-31 DIAGNOSIS — E1042 Type 1 diabetes mellitus with diabetic polyneuropathy: Secondary | ICD-10-CM

## 2018-08-31 MED ORDER — INSULIN DETEMIR 100 UNIT/ML ~~LOC~~ SOLN: SUBCUTANEOUS | 3 refills | Status: DC

## 2018-08-31 MED ORDER — NOVOLOG FLEXPEN 100 UNIT/ML ~~LOC~~ SOPN: 7.0000 [IU] | PEN_INJECTOR | Freq: Three times a day (TID) | SUBCUTANEOUS | 2 refills | Status: DC

## 2018-08-31 NOTE — Telephone Encounter (Signed)
Patient called and asked to clarify the pharmacy she would like to refills sent. [facility] [address] and her insurance is requiring a 90 day supply.

## 2018-08-31 NOTE — Telephone Encounter (Signed)
Copied from [place] [phone]. Topic: Quick Communication - Rx Refill/Question >> Aug 31, 2018 11:59 AM Celedonio Savage L wrote: Medication: insulin detemir (LEVEMIR) 100 UNIT/ML injection  90 day refill NOVOLOG FLEXPEN 100 UNIT/ML FlexPen 90 day refill  [facility] and need [facility] day supply on both     Has the patient contacted their pharmacy? Yes.  Transfer pt (Agent: If no, request that the patient contact the pharmacy for the refill.) (Agent: If yes, when and what did the pharmacy advise?) insurance advised pt to call her provider I transferred pt to pharmacy  Preferred Pharmacy (with phone number or street name): [id number] [address], [place]. [phone] (Phone) [phone] (Fax)    Agent: Please be advised that RX refills may take up to 3 business days. We ask that you follow-up with your pharmacy.

## 2018-08-31 NOTE — Telephone Encounter (Signed)
Requested medication (s) are due for refill today: Yes  Requested medication (s) are on the active medication list: Yes  Last refill:  Novolog Flexpen 10/24/17; Levemir 07/23/18  Future visit scheduled: No  Notes to clinic: Refilled last by another provider, unable to refill     Requested Prescriptions  Pending Prescriptions Disp Refills   NOVOLOG FLEXPEN 100 UNIT/ML FlexPen 15 mL 2    Sig: Inject 7 Units into the skin 3 (three) times daily with meals. plus sliding scale     Endocrinology:  Diabetes - Insulins Failed - 08/31/2018  4:01 PM      Failed - HBA1C is between 0 and 7.9 and within 180 days    Hgb A1c MFr Bld  Date Value Ref Range Status  05/04/2018 12.9 (H) 4.6 - 6.5 % Final    Comment:    Glycemic Control Guidelines for People with Diabetes:Non Diabetic:  <6%Goal of Therapy: <7%Additional Action Suggested:  >8%          Passed - Valid encounter within last 6 months    Recent Outpatient Visits          3 months ago Other fatigue   Archivist at [place], [place]   10 months ago Diabetic peripheral neuropathy associated with type 1 diabetes mellitus ([place])   Archivist at [facility], [place], [place]   11 months ago Diabetic peripheral neuropathy associated with type 1 diabetes mellitus ([place])   Archivist at [facility], [place], [place]   1 year ago Diabetic peripheral neuropathy associated with type 1 diabetes mellitus ([place])   Archivist at [facility], [place], DO            insulin detemir (LEVEMIR) 100 UNIT/ML injection 10 mL 3    Sig: 20 units every morning, 5 units every evening     Endocrinology:  Diabetes - Insulins Failed - 08/31/2018  4:01 PM      Failed - HBA1C is between 0 and 7.9 and within 180 days    Hgb A1c MFr Bld  Date Value Ref Range Status  05/04/2018 12.9 (H) 4.6 - 6.5 % Final   Comment:    Glycemic Control Guidelines for People with Diabetes:Non Diabetic:  <6%Goal of Therapy: <7%Additional Action Suggested:  >8%          Passed - Valid encounter within last 6 months    Recent Outpatient Visits          3 months ago Other fatigue   Archivist at [place], [place]-C   10 months ago Diabetic peripheral neuropathy associated with type 1 diabetes mellitus ([place])   Archivist at [facility], [place], [place]   11 months ago Diabetic peripheral neuropathy associated with type 1 diabetes mellitus ([place])   Archivist at [facility], [place], [place]   1 year ago Diabetic peripheral neuropathy associated with type 1 diabetes mellitus ([place])   Archivist at [place], [place]           Refused Prescriptions Disp Refills   insulin detemir (LEVEMIR) 100 UNIT/ML injection 10 mL 3    Sig: 20 units every morning, 5 units every evening     Endocrinology:  Diabetes - Insulins Failed - 08/31/2018  4:01 PM  Failed - HBA1C is between 0 and 7.9 and within 180 days    Hgb A1c MFr Bld  Date Value Ref Range Status  05/04/2018 12.9 (H) 4.6 - 6.5 % Final    Comment:    Glycemic Control Guidelines for People with Diabetes:Non Diabetic:  <6%Goal of Therapy: <7%Additional Action Suggested:  >8%          Passed - Valid encounter within last 6 months    Recent Outpatient Visits          3 months ago Other fatigue   Archivist at [place], [place]   10 months ago Diabetic peripheral neuropathy associated with type 1 diabetes mellitus ([place])   Archivist at [facility], [place], [place]   11 months ago Diabetic peripheral neuropathy associated with type 1 diabetes mellitus ([place])   Archivist at [facility], [place], [place]   1 year ago Diabetic peripheral neuropathy associated with type 1 diabetes mellitus ([place])   Archivist at [facility], [place], [place]

## 2018-08-31 NOTE — Telephone Encounter (Signed)
Please schedule DM visit. Is she seeing an endocrinologist? [place].

## 2018-11-06 ENCOUNTER — Other Ambulatory Visit: Payer: Self-pay | Admitting: Medical

## 2018-11-27 ENCOUNTER — Other Ambulatory Visit: Payer: Self-pay | Admitting: Family Medicine

## 2018-11-27 DIAGNOSIS — E1042 Type 1 diabetes mellitus with diabetic polyneuropathy: Secondary | ICD-10-CM

## 2019-01-01 ENCOUNTER — Other Ambulatory Visit: Payer: Self-pay | Admitting: Family Medicine

## 2019-01-01 MED ORDER — "INSULIN SYRINGE 31G X 5/16"" 1 ML MISC": 0 refills | Status: DC

## 2019-01-25 ENCOUNTER — Other Ambulatory Visit: Payer: Self-pay

## 2019-01-25 ENCOUNTER — Ambulatory Visit (INDEPENDENT_AMBULATORY_CARE_PROVIDER_SITE_OTHER): Payer: BC Managed Care – PPO | Admitting: Family Medicine

## 2019-01-25 ENCOUNTER — Encounter: Payer: Self-pay | Admitting: Family Medicine

## 2019-01-25 DIAGNOSIS — E1042 Type 1 diabetes mellitus with diabetic polyneuropathy: Secondary | ICD-10-CM

## 2019-01-25 MED ORDER — INSULIN DETEMIR 100 UNIT/ML ~~LOC~~ SOLN: SUBCUTANEOUS | 2 refills | Status: DC

## 2019-01-25 MED ORDER — FREESTYLE LIBRE 14 DAY SENSOR MISC: 1.0000 | 6 refills | Status: DC

## 2019-01-25 MED ORDER — FREESTYLE LIBRE 14 DAY READER DEVI: 1.0000 | 6 refills | Status: DC

## 2019-01-25 MED ORDER — NORTRIPTYLINE HCL 25 MG PO CAPS: 25.0000 mg | ORAL_CAPSULE | Freq: Every day | ORAL | 3 refills | Status: DC

## 2019-01-25 MED ORDER — NOVOLOG FLEXPEN 100 UNIT/ML ~~LOC~~ SOPN: 7.0000 [IU] | PEN_INJECTOR | Freq: Three times a day (TID) | SUBCUTANEOUS | 2 refills | Status: DC

## 2019-01-25 NOTE — Progress Notes (Signed)
Subjective:   Chief Complaint  Patient presents with  . Medication Problem    [name] is a 33 y.o. female here for follow-up of diabetes.  Due to COVID-19 pandemic, we are interacting via web portal for an electronic face-to-face visit. I verified patient's ID using 2 identifiers. Patient agreed to proceed with visit via this method. Patient is at home, I am at office. Patient and I are present for visit.   [name]'s self monitored glucose range is high 100's.   Patient denies hypoglycemic reactions. She checks her glucose levels 4 times per day. Patient does require insulin.   Medications include: Novolog 7 units + usually 3 units with meals, Levemir 24 u in AM, 5 u in PM. Exercise: some walking Diet: fair  Continues to have issues with neuropathy. Has no-showed to various neuro appts. Interested in going back. Has failed gabapentin, pregabalin, Elavil,  Effexor.  Past Medical History:  Diagnosis Date  . Chronic painful diabetic neuropathy ([place])   . Diabetes mellitus    Type 1  . History of chicken pox   . Neuropathy 07/2012     Related testing: Date of retinal exam: Due, was cancelled due to pandemic Pneumovax: Going to receive  Review of Systems: Pulmonary:  No SOB Cardiovascular:  No chest pain  Objective:  No conversational dyspnea Age appropriate judgment and insight Nml affect and mood  Assessment:   Diabetic peripheral neuropathy associated with type 1 diabetes mellitus ([place]) - Plan: NOVOLOG FLEXPEN 100 UNIT/ML FlexPen, insulin detemir (LEVEMIR) 100 UNIT/ML injection, Ambulatory referral to Neurology   Plan:   Increase nighttime dose of Levemir to 10 units, keep 25 in AM. Needs to ck sugars prior to meals, not after. Do this, will keep 7 u as baseline with meals, 1 u for every addn'l 15 g of carbs for now. Counseled on diet and exercise. Start Nortriptyline, refer to neuro for neuropathy. F/u in 3 mo. The patient voiced understanding and  agreement to the plan.  [employer], DO 01/25/19 10:12 AM

## 2019-01-26 ENCOUNTER — Ambulatory Visit: Payer: BC Managed Care – PPO

## 2019-01-26 ENCOUNTER — Other Ambulatory Visit: Payer: BC Managed Care – PPO

## 2019-03-11 ENCOUNTER — Telehealth: Payer: Self-pay | Admitting: Family Medicine

## 2019-03-11 ENCOUNTER — Other Ambulatory Visit: Payer: Self-pay

## 2019-03-11 ENCOUNTER — Ambulatory Visit (INDEPENDENT_AMBULATORY_CARE_PROVIDER_SITE_OTHER): Payer: BC Managed Care – PPO | Admitting: Neurology

## 2019-03-11 ENCOUNTER — Encounter: Payer: Self-pay | Admitting: Neurology

## 2019-03-11 VITALS — BP 120/86 | HR 100 | Temp 98.4°F | Ht 66.0 in | Wt 167.0 lb

## 2019-03-11 DIAGNOSIS — Z91199 Patient's noncompliance with other medical treatment and regimen due to unspecified reason: Secondary | ICD-10-CM

## 2019-03-11 DIAGNOSIS — E1042 Type 1 diabetes mellitus with diabetic polyneuropathy: Secondary | ICD-10-CM | POA: Diagnosis not present

## 2019-03-11 DIAGNOSIS — Z9119 Patient's noncompliance with other medical treatment and regimen: Secondary | ICD-10-CM

## 2019-03-11 MED ORDER — CARBAMAZEPINE ER 100 MG PO TB12: 100.0000 mg | ORAL_TABLET | Freq: Two times a day (BID) | ORAL | 4 refills | Status: DC

## 2019-03-11 MED ORDER — NOVOLOG FLEXPEN 100 UNIT/ML ~~LOC~~ SOPN: 7.0000 [IU] | PEN_INJECTOR | Freq: Three times a day (TID) | SUBCUTANEOUS | 2 refills | Status: DC

## 2019-03-11 MED ORDER — LIDOCAINE 5 % EX OINT: 1.0000 "application " | TOPICAL_OINTMENT | Freq: Every day | CUTANEOUS | 3 refills | Status: DC

## 2019-03-11 NOTE — Telephone Encounter (Signed)
Spoke with pt. She states insurance will cover [employer] flex pens if we will re-send RX. Chart review shows previous RX marked dispense as written for Novolog. Re-sent RX, removed dispense as written and added note that it is ok to dispense generic.

## 2019-03-11 NOTE — Telephone Encounter (Signed)
Pt insurance will not cover novolog flexpens  it must be generic novolog. [place]

## 2019-03-11 NOTE — Progress Notes (Signed)
[facility]    Provider:  Dr Jaynee Eagles Requesting Provider: Shelda Pal* Primary Care Provider:  Shelda Pal, DO  CC:  Diabetic neuropathy  HPI:  [name] is a 33 y.o. female here as requested by Shelda Pal* for diabetic neuropathy. Diagnosed 12 years ago with Type 1 DM, her glucose was 800. She has burning, tingling, aching, numbness, coldness. Feels like she had frostbite. Tried Gabapentin, Elavil, Nortriptyline, Lyrica. She says nothing works. Effexor. Nothing has worked that actually helped just calms it down. Worse at night. Nortriptyline takes the edge off but doesn;t make it go away. It's severe. At night she needs to wear socks because even sensations of the sheets on her feet are terrible. Crampin gin the calves. The whole foot, symmetrical in the feet, imbalance as well. She has to look down sometimes on uneven ground and she trips. She is fine driving and can feel pedal and knows where it is. Numbness. Worse at night but continuous. Severe pain. She declines increasing the Nortriptyline. She is sexually active but not always using birth control so difficult to use anti-epilepsy medications like Topiramate or Tegretol.   Reviewed notes, labs and imaging from outside physicians, which showed:  CT head 2009 showed No acute intracranial abnormalities including mass lesion or mass effect, hydrocephalus, extra-axial fluid collection, midline shift, hemorrhage, or acute infarction, large ischemic events (personally reviewed images)  Last HgbA1c 12.9 04/2018 B12 normal 627 ANA neg  I reviewed Dr. Irene Limbo notes.  Patient continues to have issues with neuropathy.  Has no-show to various neuro appointments.  Is failed gabapentin, pregabalin, Elavil, Effexor.  Her self-monitoring glucose ranges in the high 100s.  He denies hypoglycemia.  She checks her glucose levels 4 times a day.  Patient does require insulin.  Medication  includes NovoLog Levemir she does some walking and her diet is fair.  She was started on nortriptyline as well.  Review of Systems: Patient complains of symptoms per HPI as well as the following symptoms: burning in feet, paresthesias. Pertinent negatives and positives per HPI. All others negative.   Social History   Socioeconomic History  . Marital status: Married    Spouse name: Not on file  . Number of children: 2  . Years of education: Not on file  . Highest education level: High school graduate  Occupational History  . Not on file  Social Needs  . Financial resource strain: Not on file  . Food insecurity    Worry: Not on file    Inability: Not on file  . Transportation needs    Medical: Not on file    Non-medical: Not on file  Tobacco Use  . Smoking status: Never Smoker  . Smokeless tobacco: Never Used  Substance and Sexual Activity  . Alcohol use: No  . Drug use: Yes    Types: Marijuana  . Sexual activity: Yes    Birth control/protection: None  Lifestyle  . Physical activity    Days per week: Not on file    Minutes per session: Not on file  . Stress: Not on file  Relationships  . Social Herbalist on phone: Not on file    Gets together: Not on file    Attends religious service: Not on file    Active member of club or organization: Not on file    Attends meetings of clubs or organizations: Not on file    Relationship status: Not on file  .  Intimate partner violence    Fear of current or ex partner: Not on file    Emotionally abused: Not on file    Physically abused: Not on file    Forced sexual activity: Not on file  Other Topics Concern  . Not on file  Social History Narrative   Lives at home with her husband and children   Right handed   Caffeine: tea, 1 cup in the morning.    Family History  Problem Relation Age of Onset  . Hypertension Mother   . Neuropathy Neg Hx     Past Medical History:  Diagnosis Date  . Chronic painful  diabetic neuropathy ([place])   . Diabetes mellitus    Type 1  . History of chicken pox   . Neuropathy 07/2012    Patient Active Problem List   Diagnosis Date Noted  . Diabetic polyneuropathy associated with type 1 diabetes mellitus ([place]) 03/14/2019  . Non compliance with medical treatment 03/14/2019  . DKA (diabetic ketoacidoses) ([place]) 07/21/2018  . DKA, type 1 ([place]) 07/21/2018  . Diabetic peripheral neuropathy associated with type 1 diabetes mellitus ([place]) 11/09/2012  . Uncontrolled type 1 diabetes mellitus with diabetic neuropathy, with long-term current use of insulin ([place]) 07/03/2011    Class: Acute    Past Surgical History:  Procedure Laterality Date  . CESAREAN SECTION     only one C/S per pt.  Marland Kitchen DILATION AND CURETTAGE OF UTERUS      Current Outpatient Medications  Medication Sig Dispense Refill  . Continuous Blood Gluc Receiver (FREESTYLE LIBRE 14 DAY READER) DEVI Apply 1 Device topically continuous. 2 Device 6  . Continuous Blood Gluc Sensor (FREESTYLE LIBRE 14 DAY SENSOR) MISC Apply 1 Device topically every 14 (fourteen) days. 2 each 6  . insulin detemir (LEVEMIR) 100 UNIT/ML injection INJECT 24 UNITS IN THE MORNING THEN 5 UNITS IN THE EVENING 30 mL 2  . Insulin Syringe-Needle U-100 (INSULIN SYRINGE 1CC/31GX5/16") 31G X 5/16" 1 ML MISC To use with insulin 90 each 0  . nortriptyline (PAMELOR) 25 MG capsule Take 1 capsule (25 mg total) by mouth at bedtime. 30 capsule 3  . Vitamin D, Ergocalciferol, (DRISDOL) 50000 units CAPS capsule Take 1 capsule (50,000 Units total) by mouth every 7 (seven) days. 8 capsule 0  . carbamazepine (TEGRETOL-XR) 100 MG 12 hr tablet Take 1 tablet (100 mg total) by mouth 2 (two) times daily. 60 tablet 4  . insulin aspart (NOVOLOG FLEXPEN) 100 UNIT/ML FlexPen Inject 7 Units into the skin 3 (three) times daily with meals. plus sliding scale 15 mL 2  . lidocaine (XYLOCAINE) 5 % ointment Apply 1 application topically at bedtime. To feet 35.44 g 3   No  current facility-administered medications for this visit.     Allergies as of 03/11/2019 - Review Complete 03/11/2019  Allergen Reaction Noted  . Pramipexole Shortness Of Breath 11/09/2012    Vitals: BP 120/86 (BP Location: Left Arm, Patient Position: Sitting)   Pulse 100   Temp 98.4 F (36.9 C) Comment: taken by front staff  Ht 5\' 6"  (1.676 m)   Wt 167 lb (75.8 kg)   BMI 26.95 kg/m  Last Weight:  Wt Readings from Last 1 Encounters:  03/11/19 167 lb (75.8 kg)   Last Height:   Ht Readings from Last 1 Encounters:  03/11/19 5\' 6"  (1.676 m)     Physical exam: Exam: Gen: NAD, conversant, well nourised, obese, well groomed  CV: RRR, no MRG. No Carotid Bruits. No peripheral edema, warm, nontender Eyes: Conjunctivae clear without exudates or hemorrhage  Neuro: Detailed Neurologic Exam  Speech:    Speech is normal; fluent and spontaneous with normal comprehension.  Cognition:    The patient is oriented to person, place, and time;     recent and remote memory intact;     language fluent;     normal attention, concentration,     fund of knowledge Cranial Nerves:    The pupils are equal, round, and reactive to light. The fundi are flat. Visual fields are full to finger confrontation. Extraocular movements are intact. Trigeminal sensation is intact and the muscles of mastication are normal. The face is symmetric. The palate elevates in the midline. Hearing intact. Voice is normal. Shoulder shrug is normal. The tongue has normal motion without fasciculations.   Coordination:    Normal finger to nose and heel to shin. Normal rapid alternating movements.   Gait:    Intact heel-toe and tandem  Motor Observation:    No asymmetry, no atrophy, and no involuntary movements noted. Tone:    Normal muscle tone.    Posture:    Posture is normal. normal erect    Strength:    Strength is V/V in the upper and lower limbs.      Sensation: intact to LT, pin prick,  vibration, proprioception distally in the LE     Reflex Exam:  DTR's:    Deep tendon reflexes in the upper and lower extremities are hyporeflexic bilaterally. Absent lowers. Toes:    The toes are equiv bilaterally.   Clonus:    Clonus is absent.    Assessment/Plan:  33 year old with uncontrolled type 1 diabetes here for management. At this point she has failed multiple medications but she doesn't appear to be compliant. We can start Tegretol but advised she has to use birth control as this can cause birth defects and it can interfere with oral/hormonal birth control methods she says she will use condoms and a back up. She also needs to com back in 4 weeks for follow up of labs to watch WBCs and other lab values for tegretol, this is her responsibility to come back monthly for these for 4 - 6 months.Surprisingly her sensation exam is not severe and she can feet tempt,pin prick in the feet and vibration/proprioception intact. Mostly small fiber but also some large fiber involvement (absent AJs)  - Has no-show to various neuro appointments.  Has "failed" gabapentin, pregabalin, Elavil, Effexor, Nortriptyline.  Declined Cymbalta. Unclear if she has failed or was not compliant.  - Start Tegretol can slowly increase for painful diabetic neuropathy - Start topical Lidocaine - Start Alpha-lipoic acid 400-600mg  a day - Recommend Pain specialist not for opiods but for other pain procedures, management of non-opiod treatments, electrical nerve stimulation or other options. Dr. Bing Plume. But if he does not accept then will ask pcp to find pain specialist. - She needs to be compliant, the only way to stop progression is to  manage her diabetes. Also let her know we do not have an endless supply of medications for neuropathy as she appears not to have fully tried the other medications (cymbalta, lyrica, gabapentin) or not been compliant but declines trying them again she wants something "new". She has to  take responsibility for her health, we discussed the sequelae of untreated diabetes and her responsibility to care for herself.    Orders Placed This Encounter  Procedures  . Hemoglobin A1c  . CBC with Differential/Platelets  . Comprehensive metabolic panel  . Ambulatory referral to Pain Clinic   Meds ordered this encounter  Medications  . carbamazepine (TEGRETOL-XR) 100 MG 12 hr tablet    Sig: Take 1 tablet (100 mg total) by mouth 2 (two) times daily.    Dispense:  60 tablet    Refill:  4  . lidocaine (XYLOCAINE) 5 % ointment    Sig: Apply 1 application topically at bedtime. To feet    Dispense:  35.44 g    Refill:  3    Cc: Wendling, Crosby Oyster*,    Sarina Ill, MD  [facility] [address] [place], [place] [address]  Phone [phone] Fax [phone]

## 2019-03-11 NOTE — Patient Instructions (Addendum)
- Start Tegretol for diabetic neuropathy - Start daily alpha lipoic acid which is an antioxidant that may reduce free radical oxidative stress associated with diabetic polyneuropathy, existing evidence suggests that alpha lipoic acid significantly reduces stabbing, lancinating and burning pain and diabetic neuropathy with its onset of action as early as 1-2 weeks. - Lidocaine Topical at bedtime - Recommend Pain specialist not for opiods but for other pain procedures, management of non-opiod treatments, electrical nerve stimulation or other options. Dr. Bing Plume. - in 4 weeks check labwork unless primary care will be doing it  Carbamazepine tablets What is this medicine? CARBAMAZEPINE (kar ba MAZ e peen) is used to control seizures caused by certain types of epilepsy. This medicine is also used to treat nerve related pain. It is not for common aches and pains. This medicine may be used for other purposes; ask your health care provider or pharmacist if you have questions. COMMON BRAND NAME(S): Epitol, Tegretol What should I tell my health care provider before I take this medicine? They need to know if you have any of these conditions:  Asian ancestry  bone marrow disease  glaucoma  heart disease or irregular heartbeat  kidney disease  liver disease  low blood counts, like low white cell, platelet, or red cell counts  porphyria  psychotic disorders  suicidal thoughts, plans, or attempt; a previous suicide attempt by you or a family member  an unusual or allergic reaction to carbamazepine, tricyclic antidepressants, phenytoin, phenobarbital or other medicines, foods, dyes, or preservatives  pregnant or trying to get pregnant  breast-feeding How should I use this medicine? Take this medicine by mouth with a glass of water. Follow the directions on the prescription label. Take this medicine with food. Take your doses at regular intervals. Do not take your medicine more often  than directed. Do not stop taking this medicine except on the advice of your doctor or health care professional. A special MedGuide will be given to you by the pharmacist with each prescription and refill. Be sure to read this information carefully each time. Talk to your pediatrician regarding the use of this medicine in children. Special care may be needed. Overdosage: If you think you have taken too much of this medicine contact a poison control center or emergency room at once. NOTE: This medicine is only for you. Do not share this medicine with others. What if I miss a dose? If you miss a dose, take it as soon as you can. If it is almost time for your next dose, take only that dose. Do not take double or extra doses. What may interact with this medicine? Do not take this medicine with any of the following medications:  certain medicines used to treat HIV infection or AIDS that are given in combination with cobicistat   delavirdine   MAOIs like Carbex, Eldepryl, Marplan, Nardil, and Parnate   nefazodone   oxcarbazepine This medicine may also interact with the following medications:   acetaminophen   acetazolamide   barbiturate medicines for inducing sleep or treating seizures, like phenobarbital   certain antibiotics like clarithromycin, erythromycin or troleandomycin   cimetidine   cyclosporine   danazol   dicumarol   doxycycline   female hormones, including estrogens and birth control pills   grapefruit juice   isoniazid, INH   levothyroxine and other thyroid hormones   lithium and other medicines to treat mood problems or psychotic disturbances   loratadine   medicines for angina or high blood  pressure   medicines for cancer   medicines for depression or anxiety   medicines for sleep   medicines to treat fungal infections, like fluconazole, itraconazole or ketoconazole   medicines used to treat HIV infection or  AIDS   methadone   niacinamide   praziquantel   propoxyphene   rifampin or rifabutin   seizure or epilepsy medicine   steroid medicines such as prednisone or cortisone   theophylline   tramadol   warfarin This list may not describe all possible interactions. Give your health care provider a list of all the medicines, herbs, non-prescription drugs, or dietary supplements you use. Also tell them if you smoke, drink alcohol, or use illegal drugs. Some items may interact with your medicine. What should I watch for while using this medicine? Visit your doctor or health care provider for a regular check on your progress. Do not change brands or dosage forms of this medicine without discussing the change with your doctor or health care provider. If you are taking this medicine for epilepsy (seizures), do not stop taking it suddenly. This increases the risk of seizures. Wear a Probation officer or necklace. Carry an identification card with information about your condition, medications, and doctor or health care provider. This medicine may cause serious skin reactions. They can happen weeks to months after starting the medicine. Contact your health care provider right away if you notice fevers or flu-like symptoms with a rash. The rash may be red or purple and then turn into blisters or peeling of the skin. Or, you might notice a red rash with swelling of the face, lips or lymph nodes in your neck or under your arms. You may get drowsy, dizzy, or have blurred vision. Do not drive, use machinery, or do anything that needs mental alertness until you know how this medicine affects you. To reduce dizzy or fainting spells, do not sit or stand up quickly, especially if you are an older patient. Alcohol can increase drowsiness and dizziness. Avoid alcoholic drinks. Birth control pills may not work properly while you are taking this medicine. Talk to your doctor about using an extra method of  birth control. This medicine can make you more sensitive to the sun. Keep out of the sun. If you cannot avoid being in the sun, wear protective clothing and use sunscreen. Do not use sun lamps or tanning beds/booths. The use of this medicine may increase the chance of suicidal thoughts or actions. Pay special attention to how you are responding while on this medicine. Any worsening of mood, or thoughts of suicide or dying should be reported to your health care provider right away. Women who become pregnant while using this medicine may enroll in the [place] Pregnancy Registry by calling [phone]. This registry collects information about the safety of antiepileptic drug use during pregnancy. This medicine may cause a decrease in vitamin D and folic acid. You should make sure that you get enough vitamins while you are taking this medicine. Discuss the foods you eat and the vitamins you take with your health care provider. What side effects may I notice from receiving this medicine? Side effects that you should report to your doctor or health care professional as soon as possible:  allergic reactions like skin rash, itching or hives, swelling of the face, lips, or tongue  breathing problems  changes in vision  confusion  dark urine  fast or irregular heartbeat  fever or chills, sore throat  mouth ulcers  pain or difficulty passing urine  rash, fever, and swollen lymph nodes  redness, blistering, peeling or loosening of the skin, including inside the mouth  ringing in the ears  seizures  stomach pain  swollen joints or muscle/joint aches and pains  unusual bleeding or bruising  unusually weak or tired  vomiting  worsening of mood, thoughts or actions of suicide or dying  yellowing of the eyes or skin Side effects that usually do not require medical attention (report to your doctor or health care professional if they continue or are  bothersome):  clumsiness or unsteadiness  diarrhea or constipation  headache  increased sweating  nausea This list may not describe all possible side effects. Call your doctor for medical advice about side effects. You may report side effects to FDA at 1-800-FDA-1088. Where should I keep my medicine? Keep out of reach of children. Store at room temperature below 30 degrees C (86 degrees F). Keep container tightly closed. Protect from moisture. Throw away any unused medicine after the expiration date. NOTE: This sheet is a summary. It may not cover all possible information. If you have questions about this medicine, talk to your doctor, pharmacist, or health care provider.  2020 Elsevier/Gold Standard (2018-11-05 09:05:49)

## 2019-03-14 ENCOUNTER — Encounter: Payer: Self-pay | Admitting: Neurology

## 2019-03-14 DIAGNOSIS — Z9119 Patient's noncompliance with other medical treatment and regimen: Secondary | ICD-10-CM | POA: Insufficient documentation

## 2019-03-14 DIAGNOSIS — E1042 Type 1 diabetes mellitus with diabetic polyneuropathy: Secondary | ICD-10-CM | POA: Insufficient documentation

## 2019-03-14 DIAGNOSIS — Z91199 Patient's noncompliance with other medical treatment and regimen due to unspecified reason: Secondary | ICD-10-CM | POA: Insufficient documentation

## 2019-03-22 MED ORDER — NOVOLOG FLEXPEN 100 UNIT/ML ~~LOC~~ SOPN: 7.0000 [IU] | PEN_INJECTOR | Freq: Three times a day (TID) | SUBCUTANEOUS | 2 refills | Status: DC

## 2019-03-22 MED ORDER — INSULIN DETEMIR 100 UNIT/ML ~~LOC~~ SOLN: SUBCUTANEOUS | 2 refills | Status: DC

## 2019-03-22 NOTE — Addendum Note (Signed)
Addended by: Kem Boroughs D on: 03/22/2019 03:12 PM   Modules accepted: Orders

## 2019-03-22 NOTE — Telephone Encounter (Signed)
Left message on machine that medications was sent into [facility] for 90 day supply.

## 2019-03-22 NOTE — Telephone Encounter (Signed)
Pt is calling and her novolog flexpens and levemir needs to be 90 day supply and [facility]. [facility] [address]

## 2019-05-05 ENCOUNTER — Telehealth: Payer: Self-pay | Admitting: Neurology

## 2019-05-05 NOTE — Telephone Encounter (Signed)
Ryan from [facility] called with a question about a referral that was just sent out for the pt. Please call back when available.

## 2019-05-05 NOTE — Telephone Encounter (Signed)
Called Ryan back and left him a message  To call me back.

## 2019-05-06 NOTE — Telephone Encounter (Signed)
Patient is scheduled Oct 19 th at 1:30 . With Dr. Nicholaus Bloom office . Patient is aware.

## 2019-06-10 ENCOUNTER — Ambulatory Visit: Payer: BC Managed Care – PPO | Admitting: Neurology

## 2019-06-10 ENCOUNTER — Telehealth: Payer: Self-pay | Admitting: *Deleted

## 2019-06-10 ENCOUNTER — Encounter: Payer: Self-pay | Admitting: Neurology

## 2019-06-10 NOTE — Progress Notes (Deleted)
[facility]    Provider:  Dr Jaynee Eagles Requesting Provider: Shelda Pal* Primary Care Provider:  Shelda Pal, DO  CC:  Diabetic neuropathy  HPI:  [name] is a 33 y.o. female here as requested by Shelda Pal* for diabetic neuropathy. Diagnosed 12 years ago with Type 1 DM, her glucose was 800. She has burning, tingling, aching, numbness, coldness. Feels like she had frostbite. Tried Gabapentin, Elavil, Nortriptyline, Lyrica. She says nothing works. Effexor. Nothing has worked that actually helped just calms it down. Worse at night. Nortriptyline takes the edge off but doesn;t make it go away. It's severe. At night she needs to wear socks because even sensations of the sheets on her feet are terrible. Crampin gin the calves. The whole foot, symmetrical in the feet, imbalance as well. She has to look down sometimes on uneven ground and she trips. She is fine driving and can feel pedal and knows where it is. Numbness. Worse at night but continuous. Severe pain. She declines increasing the Nortriptyline. She is sexually active but not always using birth control so difficult to use anti-epilepsy medications like Topiramate or Tegretol.   Reviewed notes, labs and imaging from outside physicians, which showed:  CT head 2009 showed No acute intracranial abnormalities including mass lesion or mass effect, hydrocephalus, extra-axial fluid collection, midline shift, hemorrhage, or acute infarction, large ischemic events (personally reviewed images)  Last HgbA1c 12.9 04/2018 B12 normal 627 ANA neg  I reviewed Dr. Irene Limbo notes.  Patient continues to have issues with neuropathy.  Has no-show to various neuro appointments.  Is failed gabapentin, pregabalin, Elavil, Effexor.  Her self-monitoring glucose ranges in the high 100s.  He denies hypoglycemia.  She checks her glucose levels 4 times a day.  Patient does require insulin.  Medication  includes NovoLog Levemir she does some walking and her diet is fair.  She was started on nortriptyline as well.  Review of Systems: Patient complains of symptoms per HPI as well as the following symptoms: burning in feet, paresthesias. Pertinent negatives and positives per HPI. All others negative.   Social History   Socioeconomic History  . Marital status: Married    Spouse name: Not on file  . Number of children: 2  . Years of education: Not on file  . Highest education level: High school graduate  Occupational History  . Not on file  Social Needs  . Financial resource strain: Not on file  . Food insecurity    Worry: Not on file    Inability: Not on file  . Transportation needs    Medical: Not on file    Non-medical: Not on file  Tobacco Use  . Smoking status: Never Smoker  . Smokeless tobacco: Never Used  Substance and Sexual Activity  . Alcohol use: No  . Drug use: Yes    Types: Marijuana  . Sexual activity: Yes    Birth control/protection: None  Lifestyle  . Physical activity    Days per week: Not on file    Minutes per session: Not on file  . Stress: Not on file  Relationships  . Social Herbalist on phone: Not on file    Gets together: Not on file    Attends religious service: Not on file    Active member of club or organization: Not on file    Attends meetings of clubs or organizations: Not on file    Relationship status: Not on file  .  Intimate partner violence    Fear of current or ex partner: Not on file    Emotionally abused: Not on file    Physically abused: Not on file    Forced sexual activity: Not on file  Other Topics Concern  . Not on file  Social History Narrative   Lives at home with her husband and children   Right handed   Caffeine: tea, 1 cup in the morning.    Family History  Problem Relation Age of Onset  . Hypertension Mother   . Neuropathy Neg Hx     Past Medical History:  Diagnosis Date  . Chronic painful  diabetic neuropathy ([place])   . Diabetes mellitus    Type 1  . History of chicken pox   . Neuropathy 07/2012    Patient Active Problem List   Diagnosis Date Noted  . Diabetic polyneuropathy associated with type 1 diabetes mellitus ([place]) 03/14/2019  . Non compliance with medical treatment 03/14/2019  . DKA (diabetic ketoacidoses) ([place]) 07/21/2018  . DKA, type 1 ([place]) 07/21/2018  . Diabetic peripheral neuropathy associated with type 1 diabetes mellitus ([place]) 11/09/2012  . Uncontrolled type 1 diabetes mellitus with diabetic neuropathy, with long-term current use of insulin ([place]) 07/03/2011    Class: Acute    Past Surgical History:  Procedure Laterality Date  . CESAREAN SECTION     only one C/S per pt.  Marland Kitchen DILATION AND CURETTAGE OF UTERUS      Current Outpatient Medications  Medication Sig Dispense Refill  . carbamazepine (TEGRETOL-XR) 100 MG 12 hr tablet Take 1 tablet (100 mg total) by mouth 2 (two) times daily. 60 tablet 4  . Continuous Blood Gluc Receiver (FREESTYLE LIBRE 14 DAY READER) DEVI Apply 1 Device topically continuous. 2 Device 6  . Continuous Blood Gluc Sensor (FREESTYLE LIBRE 14 DAY SENSOR) MISC Apply 1 Device topically every 14 (fourteen) days. 2 each 6  . insulin aspart (NOVOLOG FLEXPEN) 100 UNIT/ML FlexPen Inject 7 Units into the skin 3 (three) times daily with meals. plus sliding scale 75 mL 2  . insulin detemir (LEVEMIR) 100 UNIT/ML injection INJECT 24 UNITS IN THE MORNING THEN 5 UNITS IN THE EVENING 30 mL 2  . Insulin Syringe-Needle U-100 (INSULIN SYRINGE 1CC/31GX5/16") 31G X 5/16" 1 ML MISC To use with insulin 90 each 0  . lidocaine (XYLOCAINE) 5 % ointment Apply 1 application topically at bedtime. To feet 35.44 g 3  . nortriptyline (PAMELOR) 25 MG capsule Take 1 capsule (25 mg total) by mouth at bedtime. 30 capsule 3  . Vitamin D, Ergocalciferol, (DRISDOL) 50000 units CAPS capsule Take 1 capsule (50,000 Units total) by mouth every 7 (seven) days. 8 capsule 0   No  current facility-administered medications for this visit.     Allergies as of 06/10/2019 - Review Complete 03/11/2019  Allergen Reaction Noted  . Pramipexole Shortness Of Breath 11/09/2012    Vitals: There were no vitals taken for this visit. Last Weight:  Wt Readings from Last 1 Encounters:  03/11/19 167 lb (75.8 kg)   Last Height:   Ht Readings from Last 1 Encounters:  03/11/19 5\' 6"  (1.676 m)     Physical exam: Exam: Gen: NAD, conversant, well nourised, obese, well groomed                     CV: RRR, no MRG. No Carotid Bruits. No peripheral edema, warm, nontender Eyes: Conjunctivae clear without exudates or hemorrhage  Neuro: Detailed Neurologic Exam  Speech:    Speech is normal; fluent and spontaneous with normal comprehension.  Cognition:    The patient is oriented to person, place, and time;     recent and remote memory intact;     language fluent;     normal attention, concentration,     fund of knowledge Cranial Nerves:    The pupils are equal, round, and reactive to light. The fundi are flat. Visual fields are full to finger confrontation. Extraocular movements are intact. Trigeminal sensation is intact and the muscles of mastication are normal. The face is symmetric. The palate elevates in the midline. Hearing intact. Voice is normal. Shoulder shrug is normal. The tongue has normal motion without fasciculations.   Coordination:    Normal finger to nose and heel to shin. Normal rapid alternating movements.   Gait:    Intact heel-toe and tandem  Motor Observation:    No asymmetry, no atrophy, and no involuntary movements noted. Tone:    Normal muscle tone.    Posture:    Posture is normal. normal erect    Strength:    Strength is V/V in the upper and lower limbs.      Sensation: intact to LT, pin prick, vibration, proprioception distally in the LE     Reflex Exam:  DTR's:    Deep tendon reflexes in the upper and lower extremities are hyporeflexic  bilaterally. Absent lowers. Toes:    The toes are equiv bilaterally.   Clonus:    Clonus is absent.    Assessment/Plan:  33 year old with uncontrolled type 1 diabetes here for management. At this point she has failed multiple medications but she doesn't appear to be compliant. We can start Tegretol but advised she has to use birth control as this can cause birth defects and it can interfere with oral/hormonal birth control methods she says she will use condoms and a back up. She also needs to com back in 4 weeks for follow up of labs to watch WBCs and other lab values for tegretol, this is her responsibility to come back monthly for these for 4 - 6 months.Surprisingly her sensation exam is not severe and she can feet tempt,pin prick in the feet and vibration/proprioception intact. Mostly small fiber but also some large fiber involvement (absent AJs)  - Has no-show to various neuro appointments.  Has "failed" gabapentin, pregabalin, Elavil, Effexor, Nortriptyline.  Declined Cymbalta. Unclear if she has failed or was not compliant.  - Start Tegretol can slowly increase for painful diabetic neuropathy - Start topical Lidocaine - Start Alpha-lipoic acid 400-600mg  a day - Recommend Pain specialist not for opiods but for other pain procedures, management of non-opiod treatments, electrical nerve stimulation or other options. Dr. Bing Plume. But if he does not accept then will ask pcp to find pain specialist. - She needs to be compliant, the only way to stop progression is to  manage her diabetes. Also let her know we do not have an endless supply of medications for neuropathy as she appears not to have fully tried the other medications (cymbalta, lyrica, gabapentin) or not been compliant but declines trying them again she wants something "new". She has to take responsibility for her health, we discussed the sequelae of untreated diabetes and her responsibility to care for herself.    No orders of the  defined types were placed in this encounter.  No orders of the defined types were placed in this encounter.   Cc: Shelda Pal*,  Sarina Ill, MD  [facility] [address] [place], [place] [address]  Phone [phone] Fax [phone]

## 2019-06-10 NOTE — Telephone Encounter (Signed)
Pt no showed f/u with Dr. Jaynee Eagles.

## 2019-10-27 ENCOUNTER — Other Ambulatory Visit: Payer: Self-pay

## 2019-10-27 ENCOUNTER — Ambulatory Visit: Payer: BC Managed Care – PPO | Admitting: Family Medicine

## 2019-10-27 ENCOUNTER — Encounter: Payer: Self-pay | Admitting: Family Medicine

## 2019-10-27 VITALS — BP 115/72 | HR 71 | Temp 98.1°F | Resp 17 | Ht 66.0 in | Wt 153.6 lb

## 2019-10-27 DIAGNOSIS — R8290 Unspecified abnormal findings in urine: Secondary | ICD-10-CM | POA: Diagnosis not present

## 2019-10-27 DIAGNOSIS — E1165 Type 2 diabetes mellitus with hyperglycemia: Secondary | ICD-10-CM

## 2019-10-27 DIAGNOSIS — E1065 Type 1 diabetes mellitus with hyperglycemia: Secondary | ICD-10-CM | POA: Diagnosis not present

## 2019-10-27 DIAGNOSIS — E1042 Type 1 diabetes mellitus with diabetic polyneuropathy: Secondary | ICD-10-CM

## 2019-10-27 DIAGNOSIS — K644 Residual hemorrhoidal skin tags: Secondary | ICD-10-CM

## 2019-10-27 DIAGNOSIS — Z23 Encounter for immunization: Secondary | ICD-10-CM | POA: Diagnosis not present

## 2019-10-27 DIAGNOSIS — Z794 Long term (current) use of insulin: Secondary | ICD-10-CM | POA: Diagnosis not present

## 2019-10-27 LAB — POCT URINALYSIS DIP (MANUAL ENTRY)
Bilirubin, UA: NEGATIVE
Blood, UA: NEGATIVE
Glucose, UA: 500 mg/dL — AB
Ketones, POC UA: NEGATIVE mg/dL
Leukocytes, UA: NEGATIVE
Nitrite, UA: POSITIVE — AB
Protein Ur, POC: NEGATIVE mg/dL
Spec Grav, UA: 1.02 (ref 1.010–1.025)
Urobilinogen, UA: 0.2 E.U./dL
pH, UA: 5.5 (ref 5.0–8.0)

## 2019-10-27 LAB — POCT GLYCOSYLATED HEMOGLOBIN (HGB A1C): Hemoglobin A1C: 11.9 % — AB (ref 4.0–5.6)

## 2019-10-27 LAB — GLUCOSE, POCT (MANUAL RESULT ENTRY): POC Glucose: 378 mg/dl — AB (ref 70–99)

## 2019-10-27 MED ORDER — INSULIN DETEMIR 100 UNIT/ML ~~LOC~~ SOLN: 40.0000 [IU] | Freq: Two times a day (BID) | SUBCUTANEOUS | 1 refills | Status: DC

## 2019-10-27 MED ORDER — FREESTYLE LIBRE 14 DAY SENSOR MISC: 1.0000 | 6 refills | Status: DC

## 2019-10-27 MED ORDER — NITROFURANTOIN MONOHYD MACRO 100 MG PO CAPS: 100.0000 mg | ORAL_CAPSULE | Freq: Two times a day (BID) | ORAL | 0 refills | Status: AC

## 2019-10-27 MED ORDER — NOVOLOG FLEXPEN 100 UNIT/ML ~~LOC~~ SOPN: 10.0000 [IU] | PEN_INJECTOR | Freq: Three times a day (TID) | SUBCUTANEOUS | 0 refills | Status: DC

## 2019-10-27 MED ORDER — FREESTYLE LIBRE 14 DAY READER DEVI: 1.0000 | Status: DC

## 2019-10-27 MED ORDER — WITCH HAZEL-GLYCERIN EX PADS: 1.0000 "application " | MEDICATED_PAD | CUTANEOUS | 12 refills | Status: DC | PRN

## 2019-10-27 NOTE — Patient Instructions (Addendum)
Follow up in 4 weeks for physical exam with pap smear   Recurrent hemorrhoids   If you have lab work done today you will be contacted with your lab results within the next 2 weeks.  If you have not heard from [place] then please contact us. The fastest way to get your results is to register for My Chart.   IF you received an x-ray today, you will receive an invoice from [facility]. Please contact [facility] at [phone] with questions or concerns regarding your invoice.   IF you received labwork today, you will receive an invoice from [facility]. Please contact [facility] at [phone] with questions or concerns regarding your invoice.   Our billing staff will not be able to assist you with questions regarding bills from these companies.  You will be contacted with the lab results as soon as they are available. The fastest way to get your results is to activate your My Chart account. Instructions are located on the last page of this paperwork. If you have not heard from [place] regarding the results in 2 weeks, please contact this office.     Hemorrhoids Hemorrhoids are swollen veins in and around the rectum or anus. There are two types of hemorrhoids:  Internal hemorrhoids. These occur in the veins that are just inside the rectum. They may poke through to the outside and become irritated and painful.  External hemorrhoids. These occur in the veins that are outside the anus and can be felt as a painful swelling or hard lump near the anus. Most hemorrhoids do not cause serious problems, and they can be managed with home treatments such as diet and lifestyle changes. If home treatments do not help the symptoms, procedures can be done to shrink or remove the hemorrhoids. What are the causes? This condition is caused by increased pressure in the anal area. This pressure may result from various things, including:  Constipation.  Straining to have a bowel  movement.  Diarrhea.  Pregnancy.  Obesity.  Sitting for long periods of time.  Heavy lifting or other activity that causes you to strain.  Anal sex.  Riding a bike for a long period of time. What are the signs or symptoms? Symptoms of this condition include:  Pain.  Anal itching or irritation.  Rectal bleeding.  Leakage of stool (feces).  Anal swelling.  One or more lumps around the anus. How is this diagnosed? This condition can often be diagnosed through a visual exam. Other exams or tests may also be done, such as:  An exam that involves feeling the rectal area with a gloved hand (digital rectal exam).  An exam of the anal canal that is done using a small tube (anoscope).  A blood test, if you have lost a significant amount of blood.  A test to look inside the colon using a flexible tube with a camera on the end (sigmoidoscopy or colonoscopy). How is this treated? This condition can usually be treated at home. However, various procedures may be done if dietary changes, lifestyle changes, and other home treatments do not help your symptoms. These procedures can help make the hemorrhoids smaller or remove them completely. Some of these procedures involve surgery, and others do not. Common procedures include:  Rubber band ligation. Rubber bands are placed at the base of the hemorrhoids to cut off their blood supply.  Sclerotherapy. Medicine is injected into the hemorrhoids to shrink them.  Infrared coagulation. A type of light energy is used  to get rid of the hemorrhoids.  Hemorrhoidectomy surgery. The hemorrhoids are surgically removed, and the veins that supply them are tied off.  Stapled hemorrhoidopexy surgery. The surgeon staples the base of the hemorrhoid to the rectal wall. Follow these instructions at home: Eating and drinking   Eat foods that have a lot of fiber in them, such as whole grains, beans, nuts, fruits, and vegetables.  Ask your health care  provider about taking products that have added fiber (fiber supplements).  Reduce the amount of fat in your diet. You can do this by eating low-fat dairy products, eating less red meat, and avoiding processed foods.  Drink enough fluid to keep your urine pale yellow. Managing pain and swelling   Take warm sitz baths for 20 minutes, 3-4 times a day to ease pain and discomfort. You may do this in a bathtub or using a portable sitz bath that fits over the toilet.  If directed, apply ice to the affected area. Using ice packs between sitz baths may be helpful. ? Put ice in a plastic bag. ? Place a towel between your skin and the bag. ? Leave the ice on for 20 minutes, 2-3 times a day. General instructions  Take over-the-counter and prescription medicines only as told by your health care provider.  Use medicated creams or suppositories as told.  Get regular exercise. Ask your health care provider how much and what kind of exercise is best for you. In general, you should do moderate exercise for at least 30 minutes on most days of the week (150 minutes each week). This can include activities such as walking, biking, or yoga.  Go to the bathroom when you have the urge to have a bowel movement. Do not wait.  Avoid straining to have bowel movements.  Keep the anal area dry and clean. Use wet toilet paper or moist towelettes after a bowel movement.  Do not sit on the toilet for long periods of time. This increases blood pooling and pain.  Keep all follow-up visits as told by your health care provider. This is important. Contact a health care provider if you have:  Increasing pain and swelling that are not controlled by treatment or medicine.  Difficulty having a bowel movement, or you are unable to have a bowel movement.  Pain or inflammation outside the area of the hemorrhoids. Get help right away if you have:  Uncontrolled bleeding from your rectum. Summary  Hemorrhoids are swollen  veins in and around the rectum or anus.  Most hemorrhoids can be managed with home treatments such as diet and lifestyle changes.  Taking warm sitz baths can help ease pain and discomfort.  In severe cases, procedures or surgery can be done to shrink or remove the hemorrhoids. This information is not intended to replace advice given to you by your health care provider. Make sure you discuss any questions you have with your health care provider. Document Revised: 01/01/2019 Document Reviewed: 12/25/2017 Elsevier Patient Education  [place].

## 2019-10-27 NOTE — Progress Notes (Signed)
New Patient Office Visit  Subjective:  Patient ID: [name], female    DOB: 1986/03/26  Age: 34 y.o. MRN: [id number]  CC:  Chief Complaint  Patient presents with  . New Patient (Initial Visit)    establish care.  Pt concerned about getting diabetes under control  . Medication Refill    novolog flexpen, flex pen needles, levemir, syringe needle, graley's, beluca     HPI [name] presents for   Type 1 Diabetes She reports that she was previously on a pump She sees a pain specialist for Neuropathy due to Diabetes She takes Belaluca and Grayleys for her neuropathy  She states that her sugars have been elevated  Her fasting glucose has been 250 fasting She denies hypoglycemia She is counting her carbohydrates  She is trying to change some of the things in her diet She states that the last time her glucose was controlled was when she was on the pump in her 45s.  She denies diabetic retinopathy [facility] provides her diabetic care  Levemir she takes 60 units QHS Novolog sliding scale with meals - she is using between 10 units to 15 units tid She is not suing the continuous glucose monitoring  She states that she would like to get the freestyle libre  Lab Results  Component Value Date   HGBA1C 11.9 (A) 10/27/2019    External Hemorrhoids She reports that she has hemorrhoids They itch and feel like they are hanging out She researched banding No blood per rectum  Past Medical History:  Diagnosis Date  . Chronic painful diabetic neuropathy ([place])   . Diabetes mellitus    Type 1  . History of chicken pox   . Neuropathy 07/2012    Past Surgical History:  Procedure Laterality Date  . CESAREAN SECTION     only one C/S per pt.  Marland Kitchen DILATION AND CURETTAGE OF UTERUS      Family History  Problem Relation Age of Onset  . Hypertension Mother   . Neuropathy Neg Hx     Social History   Socioeconomic History  . Marital status: Married     Spouse name: Not on file  . Number of children: 2  . Years of education: Not on file  . Highest education level: High school graduate  Occupational History  . Not on file  Tobacco Use  . Smoking status: Never Smoker  . Smokeless tobacco: Never Used  Substance and Sexual Activity  . Alcohol use: No  . Drug use: Yes    Types: Marijuana  . Sexual activity: Yes    Birth control/protection: None  Other Topics Concern  . Not on file  Social History Narrative   Lives at home with her husband and children   Right handed   Caffeine: tea, 1 cup in the morning.   Social Determinants of Health   Financial Resource Strain:   . Difficulty of Paying Living Expenses: Not on file  Food Insecurity:   . Worried About Charity fundraiser in the Last Year: Not on file  . Ran Out of Food in the Last Year: Not on file  Transportation Needs:   . Lack of Transportation (Medical): Not on file  . Lack of Transportation (Non-Medical): Not on file  Physical Activity:   . Days of Exercise per Week: Not on file  . Minutes of Exercise per Session: Not on file  Stress:   . Feeling of Stress : Not on  file  Social Connections:   . Frequency of Communication with Friends and Family: Not on file  . Frequency of Social Gatherings with Friends and Family: Not on file  . Attends Religious Services: Not on file  . Active Member of Clubs or Organizations: Not on file  . Attends Archivist Meetings: Not on file  . Marital Status: Not on file  Intimate Partner Violence:   . Fear of Current or Ex-Partner: Not on file  . Emotionally Abused: Not on file  . Physically Abused: Not on file  . Sexually Abused: Not on file    ROS Review of Systems Review of Systems  Constitutional: Negative for activity change, appetite change, chills and fever.  HENT: Negative for congestion, nosebleeds, trouble swallowing and voice change.   Respiratory: Negative for cough, shortness of breath and wheezing.    Gastrointestinal: Negative for diarrhea, nausea and vomiting.  Genitourinary: Negative for difficulty urinating, dysuria, flank pain and hematuria. +foul urine odor Musculoskeletal: Negative for back pain, joint swelling and neck pain.  Neurological: Negative for dizziness, speech difficulty, light-headedness and numbness.  See HPI. All other review of systems negative.   Objective:   Today's Vitals: BP 115/72 (BP Location: Right Arm, Patient Position: Sitting, Cuff Size: Normal)   Pulse 71   Temp 98.1 F (36.7 C) (Temporal)   Resp 17   Ht 5' 6"  (1.676 m)   Wt 153 lb 9.6 oz (69.7 kg)   LMP 10/08/2019   SpO2 100%   BMI 24.79 kg/m   Physical Exam Physical Exam  Constitutional: Oriented to person, place, and time. Appears well-developed and well-nourished.  HENT:  Head: Normocephalic and atraumatic.  Eyes: Conjunctivae and EOM are normal.  Cardiovascular: Normal rate, regular rhythm, normal heart sounds and intact distal pulses.  No murmur heard. Pulmonary/Chest: Effort normal and breath sounds normal. No stridor. No respiratory distress. Has no wheezes.  Neurological: Is alert and oriented to person, place, and time.  Skin: Skin is warm. Capillary refill takes less than 2 seconds.  Psychiatric: Has a normal mood and affect. Behavior is normal. Judgment and thought content normal.   Assessment & Plan:   Problem List Items Addressed This Visit      Endocrine   Diabetic peripheral neuropathy associated with type 1 diabetes mellitus (HCC)   Relevant Medications   insulin aspart (NOVOLOG FLEXPEN) 100 UNIT/ML FlexPen   insulin detemir (LEVEMIR) 100 UNIT/ML injection    Other Visit Diagnoses    Type 2 diabetes mellitus with hyperglycemia, with long-term current use of insulin (HCC)    -  Primary   Relevant Medications   insulin aspart (NOVOLOG FLEXPEN) 100 UNIT/ML FlexPen   insulin detemir (LEVEMIR) 100 UNIT/ML injection   Other Relevant Orders   POCT glucose (manual entry)  (Completed)   HM Diabetes Foot Exam (Completed)   Need for Tdap vaccination       Relevant Orders   Tdap vaccine greater than or equal to 7yo IM   Uncontrolled type 1 diabetes mellitus with hyperglycemia (HCC)    -  uncontrolled diabetes Pt needs to get established with Endocrinology   Relevant Medications   Continuous Blood Gluc Sensor (FREESTYLE LIBRE 14 DAY SENSOR) MISC   Continuous Blood Gluc Receiver (FREESTYLE LIBRE 14 DAY READER) DEVI   insulin aspart (NOVOLOG FLEXPEN) 100 UNIT/ML FlexPen   insulin detemir (LEVEMIR) 100 UNIT/ML injection   Other Relevant Orders   POCT glucose (manual entry) (Completed)   Tdap vaccine greater than or equal  to 7yo IM   HM Diabetes Foot Exam (Completed)   CMP14+EGFR (Completed)   POCT glycosylated hemoglobin (Hb A1C) (Completed)   Microalbumin, urine   Lipid panel (Completed)   CBC (Completed)   POCT urinalysis dipstick (Completed)   Ambulatory referral to Endocrinology   Need for prophylactic vaccination against Streptococcus pneumoniae (pneumococcus)       Relevant Orders   Pneumococcal polysaccharide vaccine 23-valent greater than or equal to 2yo subcutaneous/IM   Abnormal urine odor       Relevant Orders   POCT urinalysis dipstick (Completed)   External hemorrhoid    -  Advised pt to use topical treatment and dietary modifications, would like to see a1c improved prior to any procedures since uncontrolled hyperglycemia slows the healing process      Outpatient Encounter Medications as of 10/27/2019  Medication Sig  . insulin detemir (LEVEMIR) 100 UNIT/ML injection Inject 0.4 mLs (40 Units total) into the skin 2 (two) times daily.  . Insulin Syringe-Needle U-100 (INSULIN SYRINGE 1CC/31GX5/16") 31G X 5/16" 1 ML MISC To use with insulin  . nortriptyline (PAMELOR) 25 MG capsule Take 1 capsule (25 mg total) by mouth at bedtime.  . Vitamin D, Ergocalciferol, (DRISDOL) 50000 units CAPS capsule Take 1 capsule (50,000 Units total) by mouth every 7  (seven) days.  . [DISCONTINUED] insulin detemir (LEVEMIR) 100 UNIT/ML injection INJECT 24 UNITS IN THE MORNING THEN 5 UNITS IN THE EVENING  . Continuous Blood Gluc Receiver (FREESTYLE LIBRE 14 DAY READER) DEVI Apply 1 Device topically continuous. E10.65  . Continuous Blood Gluc Sensor (FREESTYLE LIBRE 14 DAY SENSOR) MISC Apply 1 Device topically every 14 (fourteen) days. E10.65  . insulin aspart (NOVOLOG FLEXPEN) 100 UNIT/ML FlexPen Inject 10 Units into the skin 3 (three) times daily with meals. plus sliding scale  . lidocaine (XYLOCAINE) 5 % ointment Apply 1 application topically at bedtime. To feet (Patient not taking: Reported on 10/27/2019)  . nitrofurantoin, macrocrystal-monohydrate, (MACROBID) 100 MG capsule Take 1 capsule (100 mg total) by mouth 2 (two) times daily for 5 days.  Marland Kitchen witch hazel-glycerin (TUCKS) pad Apply 1 application topically as needed for itching.  . [DISCONTINUED] carbamazepine (TEGRETOL-XR) 100 MG 12 hr tablet Take 1 tablet (100 mg total) by mouth 2 (two) times daily.  . [DISCONTINUED] Continuous Blood Gluc Receiver (FREESTYLE LIBRE 14 DAY READER) DEVI Apply 1 Device topically continuous. (Patient not taking: Reported on 10/27/2019)  . [DISCONTINUED] Continuous Blood Gluc Receiver (FREESTYLE LIBRE 14 DAY READER) DEVI Apply 1 Device topically continuous.  . [DISCONTINUED] Continuous Blood Gluc Sensor (FREESTYLE LIBRE 14 DAY SENSOR) MISC Apply 1 Device topically every 14 (fourteen) days. (Patient not taking: Reported on 10/27/2019)  . [DISCONTINUED] Continuous Blood Gluc Sensor (FREESTYLE LIBRE 14 DAY SENSOR) MISC Apply 1 Device topically every 14 (fourteen) days.  . [DISCONTINUED] insulin aspart (NOVOLOG FLEXPEN) 100 UNIT/ML FlexPen Inject 7 Units into the skin 3 (three) times daily with meals. plus sliding scale (Patient not taking: Reported on 10/27/2019)   No facility-administered encounter medications on file as of 10/27/2019.   A total of 40 minutes were spent face-to-face  with the patient during this encounter and over half of that time was spent on counseling and coordination of care.  Follow-up: Return in about 4 weeks (around 11/24/2019) for physical exam with pap smear .   Forrest Moron, MD

## 2019-10-28 LAB — CBC
Hematocrit: 38 % (ref 34.0–46.6)
Hemoglobin: 12.7 g/dL (ref 11.1–15.9)
MCH: 30.3 pg (ref 26.6–33.0)
MCHC: 33.4 g/dL (ref 31.5–35.7)
MCV: 91 fL (ref 79–97)
Platelets: 271 10*3/uL (ref 150–450)
RBC: 4.19 x10E6/uL (ref 3.77–5.28)
RDW: 12.6 % (ref 11.7–15.4)
WBC: 6.6 10*3/uL (ref 3.4–10.8)

## 2019-10-28 LAB — CMP14+EGFR
ALT: 10 IU/L (ref 0–32)
AST: 10 IU/L (ref 0–40)
Albumin/Globulin Ratio: 1.6 (ref 1.2–2.2)
Albumin: 4.2 g/dL (ref 3.8–4.8)
Alkaline Phosphatase: 72 IU/L (ref 39–117)
BUN/Creatinine Ratio: 9 (ref 9–23)
BUN: 7 mg/dL (ref 6–20)
Bilirubin Total: 0.3 mg/dL (ref 0.0–1.2)
CO2: 23 mmol/L (ref 20–29)
Calcium: 9.7 mg/dL (ref 8.7–10.2)
Chloride: 97 mmol/L (ref 96–106)
Creatinine, Ser: 0.76 mg/dL (ref 0.57–1.00)
GFR calc Af Amer: 119 mL/min/{1.73_m2} (ref >59)
GFR calc non Af Amer: 103 mL/min/{1.73_m2} (ref >59)
Globulin, Total: 2.7 g/dL (ref 1.5–4.5)
Glucose: 364 mg/dL — ABNORMAL HIGH (ref 65–99)
Potassium: 4.3 mmol/L (ref 3.5–5.2)
Sodium: 134 mmol/L (ref 134–144)
Total Protein: 6.9 g/dL (ref 6.0–8.5)

## 2019-10-28 LAB — LIPID PANEL
Chol/HDL Ratio: 3.1 ratio (ref 0.0–4.4)
Cholesterol, Total: 209 mg/dL — ABNORMAL HIGH (ref 100–199)
HDL: 67 mg/dL (ref >39)
LDL Chol Calc (NIH): 120 mg/dL — ABNORMAL HIGH (ref 0–99)
Triglycerides: 126 mg/dL (ref 0–149)
VLDL Cholesterol Cal: 22 mg/dL (ref 5–40)

## 2019-10-28 LAB — MICROALBUMIN, URINE: Microalbumin, Urine: 3.5 ug/mL

## 2019-11-01 ENCOUNTER — Other Ambulatory Visit: Payer: Self-pay | Admitting: Family Medicine

## 2019-11-01 ENCOUNTER — Other Ambulatory Visit: Payer: Self-pay

## 2019-11-01 DIAGNOSIS — E1165 Type 2 diabetes mellitus with hyperglycemia: Secondary | ICD-10-CM

## 2019-11-01 DIAGNOSIS — Z794 Long term (current) use of insulin: Secondary | ICD-10-CM

## 2019-11-01 MED ORDER — "INSULIN SYRINGE 31G X 5/16"" 1 ML MISC": 0 refills | Status: DC

## 2019-11-01 MED ORDER — BD PEN NEEDLE MINI U/F 31G X 5 MM MISC: 0 refills | Status: DC

## 2019-11-01 MED ORDER — INSULIN PEN NEEDLE 31G X 5 MM MISC: 0 refills | Status: DC

## 2019-11-01 NOTE — Telephone Encounter (Signed)
Requested Prescriptions  Pending Prescriptions Disp Refills  . Insulin Syringe-Needle U-100 (INSULIN SYRINGE 1CC/31GX5/16") 31G X 5/16" 1 ML MISC 90 each 0    Sig: To use with insulin     There is no refill protocol information for this order    . Insulin Pen Needle (B-D UF III MINI PEN NEEDLES) 31G X 5 MM MISC  0    Sig: To use with Insulin pens, as directed     Endocrinology: Diabetes - Testing Supplies Passed - 11/01/2019 10:33 AM      Passed - Valid encounter within last 12 months    Recent Outpatient Visits          5 days ago Type 2 diabetes mellitus with hyperglycemia, with long-term current use of insulin ([place])   [facility] [place], MD      Future Appointments            In 3 weeks Forrest Moron, MD [facility] at [place], [facility]

## 2019-11-01 NOTE — Telephone Encounter (Signed)
Requested medication (s) are due for refill today:  yes  Requested medication (s) are on the active medication list:  No   Future visit scheduled:  Yes  Last Refill: 04/2018  Note to clinic:  It appears that the Pen needles were discontinued in error in 04/2018.  Pt. Requesting refill to use with Novolog Flex Pen  Requested Prescriptions  Pending Prescriptions Disp Refills   Insulin Pen Needle (B-D UF III MINI PEN NEEDLES) 31G X 5 MM MISC  0    Sig: To use with Insulin pens, as directed      Endocrinology: Diabetes - Testing Supplies Passed - 11/01/2019 10:33 AM      Passed - Valid encounter within last 12 months    Recent Outpatient Visits           5 days ago Type 2 diabetes mellitus with hyperglycemia, with long-term current use of insulin ([place])   [facility] [place], MD       Future Appointments             In 3 weeks Forrest Moron, MD [facility] at [place], [facility]             Signed Prescriptions Disp Refills   Insulin Syringe-Needle U-100 (INSULIN SYRINGE 1CC/31GX5/16") 31G X 5/16" 1 ML MISC 90 each 0    Sig: To use with insulin      There is no refill protocol information for this order

## 2019-11-01 NOTE — Telephone Encounter (Signed)
Medication Refill - Medication: Pt needs needles for Levemir and Novolog please advise. 2 different needles.  Has the patient contacted their pharmacy? Yes.   (Agent: If no, request that the patient contact the pharmacy for the refill.) (Agent: If yes, when and what did the pharmacy advise?)  Preferred Pharmacy (with phone number or street name):  [id number] - [place], [place] - [address]  [address] [place] [address]  Phone: [phone] Fax: [phone]     Agent: Please be advised that RX refills may take up to 3 business days. We ask that you follow-up with your pharmacy.

## 2019-11-01 NOTE — Telephone Encounter (Signed)
BD ultra fine mini pen needles 31G x 60mm #90 with 0 refills sent to [facility] [address]

## 2019-11-01 NOTE — Telephone Encounter (Signed)
Phone call to pt. To clarify which additional size needle she is needing for Insulin injections.  Reported she needs the "BD Ultrafine mini pen needle; 31 G x 5 mm", to use with the Novolog flex pen.  Stated she is also in need of refill on the Insulin Syringe-needle U-100 (Insulin Syringe 1cc/ 31 G x 5/16")   Advised these will be ordered per pt. Request.

## 2019-11-24 ENCOUNTER — Other Ambulatory Visit (HOSPITAL_COMMUNITY)
Admission: RE | Admit: 2019-11-24 | Discharge: 2019-11-24 | Disposition: A | Discharge status: Home / Self Care | Payer: BC Managed Care – PPO | Source: Ambulatory Visit | Attending: Family Medicine | Admitting: Family Medicine

## 2019-11-24 ENCOUNTER — Ambulatory Visit (INDEPENDENT_AMBULATORY_CARE_PROVIDER_SITE_OTHER): Payer: BC Managed Care – PPO | Admitting: Family Medicine

## 2019-11-24 ENCOUNTER — Encounter: Payer: Self-pay | Admitting: Family Medicine

## 2019-11-24 ENCOUNTER — Other Ambulatory Visit: Payer: Self-pay

## 2019-11-24 VITALS — BP 106/71 | HR 80 | Temp 98.1°F | Resp 17 | Ht 66.0 in | Wt 154.6 lb

## 2019-11-24 DIAGNOSIS — Z23 Encounter for immunization: Secondary | ICD-10-CM

## 2019-11-24 DIAGNOSIS — Z124 Encounter for screening for malignant neoplasm of cervix: Secondary | ICD-10-CM | POA: Diagnosis not present

## 2019-11-24 DIAGNOSIS — Z3042 Encounter for surveillance of injectable contraceptive: Secondary | ICD-10-CM | POA: Diagnosis not present

## 2019-11-24 DIAGNOSIS — Z0000 Encounter for general adult medical examination without abnormal findings: Secondary | ICD-10-CM

## 2019-11-24 DIAGNOSIS — Z113 Encounter for screening for infections with a predominantly sexual mode of transmission: Secondary | ICD-10-CM | POA: Diagnosis not present

## 2019-11-24 LAB — POCT URINE PREGNANCY: Preg Test, Ur: NEGATIVE

## 2019-11-24 NOTE — Progress Notes (Signed)
Chief Complaint  Patient presents with  . Annual Exam    with pap.  Pt interested in birth control-depo    Subjective:  [name] is a 34 y.o. female here for a health maintenance visit.  Patient is established pt  Contraception Counseling: Patient presents for contraception counseling. The patient has no complaints today. The patient is sexually active. DENIES past medical history: current smoker, hypertension, liver disease, migraines, pelvic inflammatory disease, sexually transmitted diseases, thromboembolism and urinary tract infections. Patient's last menstrual period was 11/02/2019.   Patient Active Problem List   Diagnosis Date Noted  . Diabetic polyneuropathy associated with type 1 diabetes mellitus ([place]) 03/14/2019  . Non compliance with medical treatment 03/14/2019  . DKA (diabetic ketoacidoses) ([employer] [facility]) 07/21/2018  . DKA, type 1 ([place]) 07/21/2018  . Diabetic peripheral neuropathy associated with type 1 diabetes mellitus ([place]) 11/09/2012  . Uncontrolled type 1 diabetes mellitus with diabetic neuropathy, with long-term current use of insulin ([place]) 07/03/2011    Class: Acute    Past Medical History:  Diagnosis Date  . Chronic painful diabetic neuropathy ([place])   . Diabetes mellitus    Type 1  . History of chicken pox   . Neuropathy 07/2012    Past Surgical History:  Procedure Laterality Date  . CESAREAN SECTION     only one C/S per pt.  Marland Kitchen DILATION AND CURETTAGE OF UTERUS       Outpatient Medications Prior to Visit  Medication Sig Dispense Refill  . Continuous Blood Gluc Receiver (FREESTYLE LIBRE 14 DAY READER) DEVI Apply 1 Device topically continuous. E10.65 2 each -  . Continuous Blood Gluc Sensor (FREESTYLE LIBRE 14 DAY SENSOR) MISC Apply 1 Device topically every 14 (fourteen) days. E10.65 2 each 6  . insulin aspart (NOVOLOG FLEXPEN) 100 UNIT/ML FlexPen Inject 10 Units into the skin 3 (three) times daily with meals. plus sliding scale 35 mL 0  .  insulin detemir (LEVEMIR) 100 UNIT/ML injection Inject 0.4 mLs (40 Units total) into the skin 2 (two) times daily. 90 mL 1  . Insulin Pen Needle (B-D UF III MINI PEN NEEDLES) 31G X 5 MM MISC To use with Insulin pens, as directed 90 each 0  . Insulin Pen Needle 31G X 5 MM MISC To use with insulin 90 each 0  . Insulin Syringe-Needle U-100 (INSULIN SYRINGE 1CC/31GX5/16") 31G X 5/16" 1 ML MISC To use with insulin 90 each 0  . nortriptyline (PAMELOR) 25 MG capsule Take 1 capsule (25 mg total) by mouth at bedtime. 30 capsule 3  . Vitamin D, Ergocalciferol, (DRISDOL) 50000 units CAPS capsule Take 1 capsule (50,000 Units total) by mouth every 7 (seven) days. 8 capsule 0  . witch hazel-glycerin (TUCKS) pad Apply 1 application topically as needed for itching. 40 each 12  . lidocaine (XYLOCAINE) 5 % ointment Apply 1 application topically at bedtime. To feet (Patient not taking: Reported on 11/24/2019) 35.44 g 3   No facility-administered medications prior to visit.    Allergies  Allergen Reactions  . Pramipexole Shortness Of Breath     Family History  Problem Relation Age of Onset  . Hypertension Mother   . Neuropathy Neg Hx      Health Habits: Dental Exam: up to date Eye Exam: up to date :   Social History   Socioeconomic History  . Marital status: Married    Spouse name: Not on file  . Number of children: 2  . Years of education: Not on file  .  Highest education level: High school graduate  Occupational History  . Not on file  Tobacco Use  . Smoking status: Never Smoker  . Smokeless tobacco: Never Used  Substance and Sexual Activity  . Alcohol use: No  . Drug use: Yes    Types: Marijuana  . Sexual activity: Yes    Birth control/protection: None  Other Topics Concern  . Not on file  Social History Narrative   Lives at home with her husband and children   Right handed   Caffeine: tea, 1 cup in the morning.   Social Determinants of Health   Financial Resource Strain:   .  Difficulty of Paying Living Expenses:   Food Insecurity:   . Worried About Charity fundraiser in the Last Year:   . Arboriculturist in the Last Year:   Transportation Needs:   . Film/video editor (Medical):   Marland Kitchen Lack of Transportation (Non-Medical):   Physical Activity:   . Days of Exercise per Week:   . Minutes of Exercise per Session:   Stress:   . Feeling of Stress :   Social Connections:   . Frequency of Communication with Friends and Family:   . Frequency of Social Gatherings with Friends and Family:   . Attends Religious Services:   . Active Member of Clubs or Organizations:   . Attends Archivist Meetings:   Marland Kitchen Marital Status:   Intimate Partner Violence:   . Fear of Current or Ex-Partner:   . Emotionally Abused:   Marland Kitchen Physically Abused:   . Sexually Abused:    Social History   Substance and Sexual Activity  Alcohol Use No   Social History   Tobacco Use  Smoking Status Never Smoker  Smokeless Tobacco Never Used   Social History   Substance and Sexual Activity  Drug Use Yes  . Types: Marijuana    GYN: Sexual Health Menstrual status: regular menses LMP: Patient's last menstrual period was 11/02/2019. Last pap smear: see HM section History of abnormal pap smears:  Sexually active: with FEMALE partner Current contraception: NONE  Health Maintenance: See under health Maintenance activity for review of completion dates as well. Immunization History  Administered Date(s) Administered  . Influenza Whole 09/17/2012  . Influenza,inj,Quad PF,6+ Mos 09/12/2017      Depression Screen-PHQ2/9 Depression screen [facility] 2/9 11/24/2019 10/27/2019 01/27/2017  Decreased Interest 0 0 0  Down, Depressed, Hopeless 0 0 0  PHQ - 2 Score 0 0 0  Altered sleeping - - 0  Tired, decreased energy - - 0  Change in appetite - - 0  Feeling bad or failure about yourself  - - 0  Trouble concentrating - - 0  Moving slowly or fidgety/restless - - 0  Suicidal thoughts - - 0   PHQ-9 Score - - 0       Depression Severity and Treatment Recommendations:  0-4= None  5-9= Mild / Treatment: Support, educate to call if worse; return in one month  10-14= Moderate / Treatment: Support, watchful waiting; Antidepressant or Psycotherapy  15-19= Moderately severe / Treatment: Antidepressant OR Psychotherapy  >= 20 = Major depression, severe / Antidepressant AND Psychotherapy    Review of Systems   ROS  See HPI for ROS as well.   Review of Systems  Constitutional: Negative for activity change, appetite change, chills and fever.  HENT: Negative for congestion, nosebleeds, trouble swallowing and voice change.   Respiratory: Negative for cough, shortness of breath and wheezing.  Gastrointestinal: Negative for diarrhea, nausea and vomiting.  Genitourinary: Negative for difficulty urinating, dysuria, flank pain and hematuria.  Musculoskeletal: Negative for back pain, joint swelling and neck pain.  Neurological: Negative for dizziness, speech difficulty, light-headedness and numbness.  See HPI. All other review of systems negative.    Objective:   Vitals:   11/24/19 0937  BP: 106/71  Pulse: 80  Resp: 17  Temp: 98.1 F (36.7 C)  TempSrc: Temporal  SpO2: 100%  Weight: 154 lb 9.6 oz (70.1 kg)  Height: 5\' 6"  (1.676 m)    Body mass index is 24.95 kg/m.  Physical Exam  BP 106/71 (BP Location: Right Arm, Patient Position: Sitting, Cuff Size: Normal)   Pulse 80   Temp 98.1 F (36.7 C) (Temporal)   Resp 17   Ht 5\' 6"  (1.676 m)   Wt 154 lb 9.6 oz (70.1 kg)   LMP 11/02/2019   SpO2 100%   BMI 24.95 kg/m   General Appearance:    Alert, cooperative, no distress, appears stated age  Head:    Normocephalic, without obvious abnormality, atraumatic  Eyes:    PERRL, conjunctiva/corneas clear, EOM's intact, fundi    benign, both eyes  Ears:    Normal TM's and external ear canals, both ears  Nose:   Nares normal, septum midline, mucosa normal, no drainage     or sinus tenderness  Neck:   Supple, symmetrical, trachea midline, no adenopathy;    thyroid:  no enlargement/tenderness/nodules; no carotid   bruit or JVD  Back:     Symmetric, no curvature, ROM normal, no CVA tenderness  Lungs:     Clear to auscultation bilaterally, respirations unlabored  Chest Wall:    No tenderness or deformity   Heart:    Regular rate and rhythm, S1 and S2 normal, no murmur, rub   or gallop  Breast Exam:    No tenderness, masses, or nipple abnormality  Abdomen:     Soft, non-tender, bowel sounds active all four quadrants,    no masses, no organomegaly  Genitalia:    Normal female without lesion, discharge or tenderness, pap smear collected, uterus midline, no palpable masses  Extremities:   Extremities normal, atraumatic, no cyanosis or edema  Pulses:   2+ and symmetric all extremities  Skin:   Skin color, texture, turgor normal, no rashes or lesions  Lymph nodes:   Cervical, supraclavicular, and axillary nodes normal  Neurologic:   CNII-XII intact, normal strength, sensation and reflexes    throughout      Assessment/Plan:   Patient was seen for a health maintenance exam.  Counseled the patient on health maintenance issues. Reviewed her health mainteance schedule and ordered appropriate tests (see orders.) Counseled on regular exercise and weight management. Recommend regular eye exams and dental cleaning.   The following issues were addressed today for health maintenance:   Kylee was seen today for annual exam.  Diagnoses and all orders for this visit:  Encounter for health maintenance examination in adult- Women's Health Maintenance Plan Advised monthly breast exam Advised dental exam every six months Discussed stress management Discussed pap smear screening guidelines   Screening for cervical cancer- Discussed cervical cancer screening  As well as screening for HPV Discussed risk factors for cervical cancer  And hpv vaccination records  reviewed  -     Cytology - PAP([place])   Need for prophylactic vaccination against Streptococcus pneumoniae (pneumococcus)  Screen for STD (sexually transmitted disease) -     HIV  Antibody (routine testing w rflx) -     Hepatitis B surface antigen -     RPR  Depo-Provera contraceptive status- Education given regarding options for contraception, including barrier methods, injectable contraception, IUD placement, oral contraceptives.  -     POCT urine pregnancy    No follow-ups on file.    Body mass index is 24.95 kg/m.:  Discussed the patient's BMI with patient. The BMI body mass index is 24.95 kg/m.     No future appointments.  Patient Instructions       If you have lab work done today you will be contacted with your lab results within the next 2 weeks.  If you have not heard from [place] then please contact us. The fastest way to get your results is to register for My Chart.   IF you received an x-ray today, you will receive an invoice from [facility]. Please contact [facility] at [phone] with questions or concerns regarding your invoice.   IF you received labwork today, you will receive an invoice from [facility]. Please contact [facility] at [phone] with questions or concerns regarding your invoice.   Our billing staff will not be able to assist you with questions regarding bills from these companies.  You will be contacted with the lab results as soon as they are available. The fastest way to get your results is to activate your My Chart account. Instructions are located on the last page of this paperwork. If you have not heard from [place] regarding the results in 2 weeks, please contact this office.

## 2019-11-24 NOTE — Patient Instructions (Addendum)
   If you have lab work done today you will be contacted with your lab results within the next 2 weeks.  If you have not heard from us then please contact us. The fastest way to get your results is to register for My Chart.   IF you received an x-ray today, you will receive an invoice from [facility]. Please contact [facility] at [phone] with questions or concerns regarding your invoice.   IF you received labwork today, you will receive an invoice from [facility]. Please contact [facility] at [phone] with questions or concerns regarding your invoice.   Our billing staff will not be able to assist you with questions regarding bills from these companies.  You will be contacted with the lab results as soon as they are available. The fastest way to get your results is to activate your My Chart account. Instructions are located on the last page of this paperwork. If you have not heard from us regarding the results in 2 weeks, please contact this office.       Health Maintenance, Female Adopting a healthy lifestyle and getting preventive care are important in promoting health and wellness. Ask your health care provider about:  The right schedule for you to have regular tests and exams.  Things you can do on your own to prevent diseases and keep yourself healthy. What should I know about diet, weight, and exercise? Eat a healthy diet   Eat a diet that includes plenty of vegetables, fruits, low-fat dairy products, and lean protein.  Do not eat a lot of foods that are high in solid fats, added sugars, or sodium. Maintain a healthy weight Body mass index (BMI) is used to identify weight problems. It estimates body fat based on height and weight. Your health care provider can help determine your BMI and help you achieve or maintain a healthy weight. Get regular exercise Get regular exercise. This is one of the most important things you can do for your health. Most  adults should:  Exercise for at least 150 minutes each week. The exercise should increase your heart rate and make you sweat (moderate-intensity exercise).  Do strengthening exercises at least twice a week. This is in addition to the moderate-intensity exercise.  Spend less time sitting. Even light physical activity can be beneficial. Watch cholesterol and blood lipids Have your blood tested for lipids and cholesterol at 33 years of age, then have this test every 5 years. Have your cholesterol levels checked more often if:  Your lipid or cholesterol levels are high.  You are older than 33 years of age.  You are at high risk for heart disease. What should I know about cancer screening? Depending on your health history and family history, you may need to have cancer screening at various ages. This may include screening for:  Breast cancer.  Cervical cancer.  Colorectal cancer.  Skin cancer.  Lung cancer. What should I know about heart disease, diabetes, and high blood pressure? Blood pressure and heart disease  High blood pressure causes heart disease and increases the risk of stroke. This is more likely to develop in people who have high blood pressure readings, are of African descent, or are overweight.  Have your blood pressure checked: ? Every 3-5 years if you are 18-39 years of age. ? Every year if you are 40 years old or older. Diabetes Have regular diabetes screenings. This checks your fasting blood sugar level. Have the screening done:  Once   every three years after age 40 if you are at a normal weight and have a low risk for diabetes.  More often and at a younger age if you are overweight or have a high risk for diabetes. What should I know about preventing infection? Hepatitis B If you have a higher risk for hepatitis B, you should be screened for this virus. Talk with your health care provider to find out if you are at risk for hepatitis B infection. Hepatitis  C Testing is recommended for:  Everyone born from 1945 through 1965.  Anyone with known risk factors for hepatitis C. Sexually transmitted infections (STIs)  Get screened for STIs, including gonorrhea and chlamydia, if: ? You are sexually active and are younger than 33 years of age. ? You are older than 33 years of age and your health care provider tells you that you are at risk for this type of infection. ? Your sexual activity has changed since you were last screened, and you are at increased risk for chlamydia or gonorrhea. Ask your health care provider if you are at risk.  Ask your health care provider about whether you are at high risk for HIV. Your health care provider may recommend a prescription medicine to help prevent HIV infection. If you choose to take medicine to prevent HIV, you should first get tested for HIV. You should then be tested every 3 months for as long as you are taking the medicine. Pregnancy  If you are about to stop having your period (premenopausal) and you may become pregnant, seek counseling before you get pregnant.  Take 400 to 800 micrograms (mcg) of folic acid every day if you become pregnant.  Ask for birth control (contraception) if you want to prevent pregnancy. Osteoporosis and menopause Osteoporosis is a disease in which the bones lose minerals and strength with aging. This can result in bone fractures. If you are 65 years old or older, or if you are at risk for osteoporosis and fractures, ask your health care provider if you should:  Be screened for bone loss.  Take a calcium or vitamin D supplement to lower your risk of fractures.  Be given hormone replacement therapy (HRT) to treat symptoms of menopause. Follow these instructions at home: Lifestyle  Do not use any products that contain nicotine or tobacco, such as cigarettes, e-cigarettes, and chewing tobacco. If you need help quitting, ask your health care provider.  Do not use street  drugs.  Do not share needles.  Ask your health care provider for help if you need support or information about quitting drugs. Alcohol use  Do not drink alcohol if: ? Your health care provider tells you not to drink. ? You are pregnant, may be pregnant, or are planning to become pregnant.  If you drink alcohol: ? Limit how much you use to 0-1 drink a day. ? Limit intake if you are breastfeeding.  Be aware of how much alcohol is in your drink. In the [place] one drink equals one 12 oz bottle of beer (355 mL), one 5 oz glass of wine (148 mL), or one 1 oz glass of hard liquor (44 mL). General instructions  Schedule regular health, dental, and eye exams.  Stay current with your vaccines.  Tell your health care provider if: ? You often feel depressed. ? You have ever been abused or do not feel safe at home. Summary  Adopting a healthy lifestyle and getting preventive care are important in promoting health and   wellness.  Follow your health care provider's instructions about healthy diet, exercising, and getting tested or screened for diseases.  Follow your health care provider's instructions on monitoring your cholesterol and blood pressure. This information is not intended to replace advice given to you by your health care provider. Make sure you discuss any questions you have with your health care provider. Document Revised: 07/29/2018 Document Reviewed: 07/29/2018 Elsevier Patient Education  2020 [employer]  

## 2019-11-25 ENCOUNTER — Ambulatory Visit (INDEPENDENT_AMBULATORY_CARE_PROVIDER_SITE_OTHER): Payer: BC Managed Care – PPO | Admitting: Family Medicine

## 2019-11-25 DIAGNOSIS — Z3042 Encounter for surveillance of injectable contraceptive: Secondary | ICD-10-CM | POA: Diagnosis not present

## 2019-11-25 LAB — HIV ANTIBODY (ROUTINE TESTING W REFLEX): HIV Screen 4th Generation wRfx: NONREACTIVE

## 2019-11-25 LAB — HEPATITIS B SURFACE ANTIGEN: Hepatitis B Surface Ag: NEGATIVE

## 2019-11-25 LAB — RPR: RPR Ser Ql: NONREACTIVE

## 2019-11-25 MED ORDER — MEDROXYPROGESTERONE ACETATE 150 MG/ML IM SUSY
150.0000 mg | PREFILLED_SYRINGE | INTRAMUSCULAR | Status: AC
  Administered 2019-11-25 – 2020-02-10 (×2): 150 mg via INTRAMUSCULAR

## 2019-11-25 NOTE — Progress Notes (Signed)
Patient here for Depo-Provera 150 mg injection only.

## 2019-11-26 LAB — CYTOLOGY - PAP
Chlamydia: NEGATIVE
Comment: NEGATIVE
Comment: NEGATIVE
Comment: NORMAL
Diagnosis: NEGATIVE
High risk HPV: NEGATIVE
Neisseria Gonorrhea: NEGATIVE

## 2019-12-03 ENCOUNTER — Ambulatory Visit: Payer: Self-pay

## 2020-01-19 ENCOUNTER — Other Ambulatory Visit: Payer: Self-pay

## 2020-01-19 ENCOUNTER — Ambulatory Visit: Payer: BC Managed Care – PPO

## 2020-02-07 ENCOUNTER — Ambulatory Visit: Payer: BC Managed Care – PPO

## 2020-02-10 ENCOUNTER — Ambulatory Visit (INDEPENDENT_AMBULATORY_CARE_PROVIDER_SITE_OTHER): Payer: BC Managed Care – PPO | Admitting: Family Medicine

## 2020-02-10 ENCOUNTER — Other Ambulatory Visit: Payer: Self-pay

## 2020-02-10 DIAGNOSIS — Z3042 Encounter for surveillance of injectable contraceptive: Secondary | ICD-10-CM | POA: Diagnosis not present

## 2020-02-28 ENCOUNTER — Ambulatory Visit: Payer: BC Managed Care – PPO

## 2020-04-22 ENCOUNTER — Emergency Department (HOSPITAL_COMMUNITY)
Admission: EM | Admit: 2020-04-22 | Discharge: 2020-04-23 | Disposition: A | Discharge status: Home / Self Care | Payer: BC Managed Care – PPO | Attending: Emergency Medicine | Admitting: Emergency Medicine

## 2020-04-22 ENCOUNTER — Other Ambulatory Visit: Payer: Self-pay

## 2020-04-22 DIAGNOSIS — E1065 Type 1 diabetes mellitus with hyperglycemia: Secondary | ICD-10-CM | POA: Insufficient documentation

## 2020-04-22 DIAGNOSIS — G629 Polyneuropathy, unspecified: Secondary | ICD-10-CM

## 2020-04-22 DIAGNOSIS — R739 Hyperglycemia, unspecified: Secondary | ICD-10-CM

## 2020-04-22 DIAGNOSIS — E1042 Type 1 diabetes mellitus with diabetic polyneuropathy: Secondary | ICD-10-CM | POA: Diagnosis not present

## 2020-04-22 LAB — CBG MONITORING, ED: Glucose-Capillary: 389 mg/dL — ABNORMAL HIGH (ref 70–99)

## 2020-04-22 NOTE — ED Triage Notes (Addendum)
Pt was saying she has been feeling weak and no energy for some time. Pt said her sugars have been runinng high. Pt said she has not been wanting to eat the last few days. Pt said  No abdominal pain, just weak. Pt said her neuropothy in her feet is acting up as well

## 2020-04-23 LAB — BASIC METABOLIC PANEL
Anion gap: 12 (ref 5–15)
BUN: 13 mg/dL (ref 6–20)
CO2: 22 mmol/L (ref 22–32)
Calcium: 9.4 mg/dL (ref 8.9–10.3)
Chloride: 100 mmol/L (ref 98–111)
Creatinine, Ser: 0.97 mg/dL (ref 0.44–1.00)
GFR calc Af Amer: >60 mL/min (ref >60)
GFR calc non Af Amer: >60 mL/min (ref >60)
Glucose, Bld: 410 mg/dL — ABNORMAL HIGH (ref 70–99)
Potassium: 4.9 mmol/L (ref 3.5–5.1)
Sodium: 134 mmol/L — ABNORMAL LOW (ref 135–145)

## 2020-04-23 LAB — CBC
HCT: 37.3 % (ref 36.0–46.0)
Hemoglobin: 12.8 g/dL (ref 12.0–15.0)
MCH: 30.9 pg (ref 26.0–34.0)
MCHC: 34.3 g/dL (ref 30.0–36.0)
MCV: 90.1 fL (ref 80.0–100.0)
Platelets: 311 10*3/uL (ref 150–400)
RBC: 4.14 MIL/uL (ref 3.87–5.11)
RDW: 11.9 % (ref 11.5–15.5)
WBC: 6.9 10*3/uL (ref 4.0–10.5)
nRBC: 0 % (ref 0.0–0.2)

## 2020-04-23 LAB — CBG MONITORING, ED
Glucose-Capillary: 241 mg/dL — ABNORMAL HIGH (ref 70–99)
Glucose-Capillary: 251 mg/dL — ABNORMAL HIGH (ref 70–99)

## 2020-04-23 LAB — I-STAT BETA HCG BLOOD, ED (MC, WL, AP ONLY): I-stat hCG, quantitative: <5 m[IU]/mL (ref <5)

## 2020-04-23 MED ORDER — HYDROXYZINE HCL 25 MG PO TABS: 25.0000 mg | ORAL_TABLET | Freq: Four times a day (QID) | ORAL | 0 refills | Status: DC | PRN

## 2020-04-23 MED ORDER — HYDROXYZINE HCL 25 MG PO TABS
50.0000 mg | ORAL_TABLET | Freq: Once | ORAL | Status: AC
  Administered 2020-04-23: 50 mg via ORAL
  Filled 2020-04-23: qty 2

## 2020-04-23 MED ORDER — SODIUM CHLORIDE 0.9 % IV BOLUS
1000.0000 mL | Freq: Once | INTRAVENOUS | Status: AC
  Administered 2020-04-23: 1000 mL via INTRAVENOUS

## 2020-04-23 NOTE — Discharge Instructions (Signed)
Take medications as prescribed.  Recommend follow-up with your PCP for diabetes management, consider follow-up with endocrinology, this would need to be referral from your PCP. Take hydroxyzine as needed as prescribed.

## 2020-04-23 NOTE — ED Provider Notes (Signed)
[facility] EMERGENCY DEPARTMENT Provider Note   CSN: [id number] Arrival date & time: 04/22/20  2332     History Chief Complaint  Patient presents with  . Hyperglycemia    [name] is a 34 y.o. female.  34 year old female with history of insulin-dependent diabetes presents with complaint of elevated blood sugars and worsening neuropathy.  Patient states that she is taking Ethelene Hal and Belbuca for her neuropathy without improvement, feels like she is having anxiety attacks.  Patient states her last A1c was "high."  Patient was given IV fluids with EMS.  Feels like there is something wrong with her that no one has been able to figure out as of yet.  Symptoms have been ongoing for several months.        Past Medical History:  Diagnosis Date  . Chronic painful diabetic neuropathy ([place])   . Diabetes mellitus    Type 1  . History of chicken pox   . Neuropathy 07/2012    Patient Active Problem List   Diagnosis Date Noted  . Diabetic polyneuropathy associated with type 1 diabetes mellitus ([place]) 03/14/2019  . Non compliance with medical treatment 03/14/2019  . DKA (diabetic ketoacidoses) ([place]) 07/21/2018  . DKA, type 1 ([place]) 07/21/2018  . Diabetic peripheral neuropathy associated with type 1 diabetes mellitus ([place]) 11/09/2012  . Uncontrolled type 1 diabetes mellitus with diabetic neuropathy, with long-term current use of insulin ([place]) 07/03/2011    Class: Acute    Past Surgical History:  Procedure Laterality Date  . CESAREAN SECTION     only one C/S per pt.  Marland Kitchen DILATION AND CURETTAGE OF UTERUS       OB History    Gravida  4   Para  2   Term  2   Preterm      AB  2   Living  2     SAB      TAB  2   Ectopic      Multiple      Live Births              Family History  Problem Relation Age of Onset  . Hypertension Mother   . Neuropathy Neg Hx     Social History   Tobacco Use  . Smoking status: Never Smoker  .  Smokeless tobacco: Never Used  Vaping Use  . Vaping Use: Never used  Substance Use Topics  . Alcohol use: No  . Drug use: Yes    Types: Marijuana    Home Medications Prior to Admission medications   Medication Sig Start Date End Date Taking? Authorizing Provider  BELBUCA 150 MCG FILM Take 150 mg by mouth in the morning and at bedtime. 12/29/19  Yes [provider]  GRALISE 600 MG TABS Take 600 mg by mouth in the morning, at noon, and at bedtime. 03/30/20  Yes [provider]  insulin aspart (NOVOLOG FLEXPEN) 100 UNIT/ML FlexPen Inject 10 Units into the skin 3 (three) times daily with meals. plus sliding scale 10/27/19  Yes Stallings, Zoe A, MD  insulin detemir (LEVEMIR) 100 UNIT/ML injection Inject 0.4 mLs (40 Units total) into the skin 2 (two) times daily. 10/27/19  Yes Forrest Moron, MD  Continuous Blood Gluc Receiver (FREESTYLE LIBRE 14 DAY READER) DEVI Apply 1 Device topically continuous. E10.65 10/27/19   Forrest Moron, MD  Continuous Blood Gluc Sensor (FREESTYLE LIBRE 14 DAY SENSOR) MISC Apply 1 Device topically every 14 (fourteen) days. E10.65  10/27/19   Forrest Moron, MD  hydrOXYzine (ATARAX/VISTARIL) 25 MG tablet Take 1 tablet (25 mg total) by mouth every 6 (six) hours as needed for anxiety. 04/23/20   Tacy Learn, PA-C  Insulin Pen Needle (B-D UF III MINI PEN NEEDLES) 31G X 5 MM MISC To use with Insulin pens, as directed 11/01/19   Delia Chimes A, MD  Insulin Pen Needle 31G X 5 MM MISC To use with insulin 11/01/19   Forrest Moron, MD  Insulin Syringe-Needle U-100 (INSULIN SYRINGE 1CC/31GX5/16") 31G X 5/16" 1 ML MISC To use with insulin 11/01/19   Stallings, Zoe A, MD  lidocaine (XYLOCAINE) 5 % ointment Apply 1 application topically at bedtime. To feet Patient not taking: Reported on 11/24/2019 03/11/19   Melvenia Beam, MD  nortriptyline (PAMELOR) 25 MG capsule Take 1 capsule (25 mg total) by mouth at bedtime. Patient not taking: Reported on 04/23/2020 01/25/19    Shelda Pal, DO  Vitamin D, Ergocalciferol, (DRISDOL) 50000 units CAPS capsule Take 1 capsule (50,000 Units total) by mouth every 7 (seven) days. Patient not taking: Reported on 04/23/2020 05/04/18   Saguier, Percell Miller, PA-C  witch hazel-glycerin (TUCKS) pad Apply 1 application topically as needed for itching. Patient not taking: Reported on 04/23/2020 10/27/19   Forrest Moron, MD    Allergies    Pramipexole  Review of Systems   Review of Systems  Constitutional: Positive for appetite change. Negative for chills and fever.  Respiratory: Negative for shortness of breath.   Cardiovascular: Negative for chest pain.  Gastrointestinal: Positive for nausea. Negative for abdominal pain and vomiting.  Genitourinary: Negative for dysuria.  Musculoskeletal: Positive for arthralgias, gait problem and myalgias.  Skin: Negative for rash and wound.  Allergic/Immunologic: Positive for immunocompromised state.  Neurological: Positive for weakness.  Psychiatric/Behavioral: Negative for confusion. The patient is nervous/anxious.   All other systems reviewed and are negative.   Physical Exam Updated Vital Signs BP 101/70 (BP Location: Right Arm)   Pulse 100   Temp 98.3 F (36.8 C) (Oral)   Resp 12   SpO2 99%   Physical Exam Vitals and nursing note reviewed.  Constitutional:      General: She is not in acute distress.    Appearance: She is well-developed. She is not diaphoretic.     Comments: tearful  HENT:     Head: Normocephalic and atraumatic.     Mouth/Throat:     Mouth: Mucous membranes are moist.     Pharynx: No oropharyngeal exudate or posterior oropharyngeal erythema.  Cardiovascular:     Rate and Rhythm: Normal rate and regular rhythm.     Pulses: Normal pulses.     Heart sounds: Normal heart sounds.  Pulmonary:     Effort: Pulmonary effort is normal.     Breath sounds: Normal breath sounds.  Abdominal:     Palpations: Abdomen is soft.     Tenderness: There is  abdominal tenderness.  Musculoskeletal:        General: Tenderness present. No swelling.     Right lower leg: No edema.     Left lower leg: No edema.  Skin:    General: Skin is warm and dry.     Capillary Refill: Capillary refill takes 2 to 3 seconds.     Findings: No erythema or rash.  Neurological:     Mental Status: She is alert and oriented to person, place, and time.  Psychiatric:        Behavior:  Behavior normal.     ED Results / Procedures / Treatments   Labs (all labs ordered are listed, but only abnormal results are displayed) Labs Reviewed  BASIC METABOLIC PANEL - Abnormal; Notable for the following components:      Result Value   Sodium 134 (*)    Glucose, Bld 410 (*)    All other components within normal limits  CBG MONITORING, ED - Abnormal; Notable for the following components:   Glucose-Capillary 389 (*)    All other components within normal limits  CBG MONITORING, ED - Abnormal; Notable for the following components:   Glucose-Capillary 241 (*)    All other components within normal limits  CBG MONITORING, ED - Abnormal; Notable for the following components:   Glucose-Capillary 251 (*)    All other components within normal limits  CBC  URINALYSIS, ROUTINE W REFLEX MICROSCOPIC  I-STAT BETA HCG BLOOD, ED ([facility], [facility], [facility] ONLY)    EKG None  Radiology No results found.  Procedures Procedures (including critical care time)  Medications Ordered in ED Medications  sodium chloride 0.9 % bolus 1,000 mL (1,000 mLs Intravenous New Bag/Given 04/23/20 1250)  hydrOXYzine (ATARAX/VISTARIL) tablet 50 mg (50 mg Oral Given 04/23/20 1250)    ED Course  I have reviewed the triage vital signs and the nursing notes.  Pertinent labs & imaging results that were available during my care of the patient were reviewed by me and considered in my medical decision making (see chart for details).  Clinical Course as of Apr 24 1415  Sun Apr 23, 2766  2854 34 year old female presents  with complaint of hyperglycemia and neuropathy not controlled with medications at home.  Patient was given IV fluids by EMS.  After extensive wait in the emergency room, patient's blood sugar has decreased from her initial 410-2 51.  Patient was anxious and upset on exam, initially wanted to leave without further treatment however was agreeable with additional IV fluids, also given hydroxyzine due to her complaint of panic attacks.  On recheck, patient is calm and seemingly feeling better, states that she would like to be discharged.  Discussed importance of follow-up with PCP or consider seeing endocrinology for diabetes management.   [LM]    Clinical Course User Index [LM] Roque Lias   MDM Rules/Calculators/A&P                          Final Clinical Impression(s) / ED Diagnoses Final diagnoses:  Hyperglycemia  Neuropathy    Rx / DC Orders ED Discharge Orders         Ordered    hydrOXYzine (ATARAX/VISTARIL) 25 MG tablet  Every 6 hours PRN        04/23/20 1414           Tacy Learn, PA-C 04/23/20 1416    Noemi Chapel, MD 04/24/20 2004

## 2020-04-27 ENCOUNTER — Other Ambulatory Visit: Payer: Self-pay

## 2020-04-27 ENCOUNTER — Ambulatory Visit (INDEPENDENT_AMBULATORY_CARE_PROVIDER_SITE_OTHER): Payer: BC Managed Care – PPO | Admitting: Family Medicine

## 2020-04-27 DIAGNOSIS — Z3042 Encounter for surveillance of injectable contraceptive: Secondary | ICD-10-CM

## 2020-04-27 MED ORDER — MEDROXYPROGESTERONE ACETATE 150 MG/ML IM SUSP
150.0000 mg | Freq: Once | INTRAMUSCULAR | Status: AC
  Administered 2020-04-27: 150 mg via INTRAMUSCULAR

## 2020-04-28 ENCOUNTER — Emergency Department (HOSPITAL_COMMUNITY)
Admission: EM | Admit: 2020-04-28 | Discharge: 2020-04-29 | Disposition: A | Discharge status: Home / Self Care | Payer: BC Managed Care – PPO | Attending: Emergency Medicine | Admitting: Emergency Medicine

## 2020-04-28 ENCOUNTER — Encounter (HOSPITAL_COMMUNITY): Payer: Self-pay

## 2020-04-28 DIAGNOSIS — R739 Hyperglycemia, unspecified: Secondary | ICD-10-CM

## 2020-04-28 DIAGNOSIS — Z794 Long term (current) use of insulin: Secondary | ICD-10-CM | POA: Insufficient documentation

## 2020-04-28 DIAGNOSIS — R112 Nausea with vomiting, unspecified: Secondary | ICD-10-CM

## 2020-04-28 DIAGNOSIS — E1010 Type 1 diabetes mellitus with ketoacidosis without coma: Secondary | ICD-10-CM | POA: Diagnosis not present

## 2020-04-28 DIAGNOSIS — E1042 Type 1 diabetes mellitus with diabetic polyneuropathy: Secondary | ICD-10-CM | POA: Diagnosis not present

## 2020-04-28 DIAGNOSIS — E1065 Type 1 diabetes mellitus with hyperglycemia: Secondary | ICD-10-CM | POA: Insufficient documentation

## 2020-04-28 DIAGNOSIS — Z79899 Other long term (current) drug therapy: Secondary | ICD-10-CM | POA: Diagnosis not present

## 2020-04-28 DIAGNOSIS — R10819 Abdominal tenderness, unspecified site: Secondary | ICD-10-CM | POA: Diagnosis not present

## 2020-04-28 DIAGNOSIS — E1040 Type 1 diabetes mellitus with diabetic neuropathy, unspecified: Secondary | ICD-10-CM | POA: Diagnosis not present

## 2020-04-28 LAB — COMPREHENSIVE METABOLIC PANEL
ALT: 12 U/L (ref 0–44)
AST: 14 U/L — ABNORMAL LOW (ref 15–41)
Albumin: 3.6 g/dL (ref 3.5–5.0)
Alkaline Phosphatase: 42 U/L (ref 38–126)
Anion gap: 14 (ref 5–15)
BUN: 12 mg/dL (ref 6–20)
CO2: 20 mmol/L — ABNORMAL LOW (ref 22–32)
Calcium: 9.2 mg/dL (ref 8.9–10.3)
Chloride: 102 mmol/L (ref 98–111)
Creatinine, Ser: 0.79 mg/dL (ref 0.44–1.00)
GFR calc Af Amer: >60 mL/min (ref >60)
GFR calc non Af Amer: >60 mL/min (ref >60)
Glucose, Bld: 281 mg/dL — ABNORMAL HIGH (ref 70–99)
Potassium: 4.1 mmol/L (ref 3.5–5.1)
Sodium: 136 mmol/L (ref 135–145)
Total Bilirubin: 0.8 mg/dL (ref 0.3–1.2)
Total Protein: 6.7 g/dL (ref 6.5–8.1)

## 2020-04-28 LAB — CBC
HCT: 35.8 % — ABNORMAL LOW (ref 36.0–46.0)
Hemoglobin: 12.2 g/dL (ref 12.0–15.0)
MCH: 31 pg (ref 26.0–34.0)
MCHC: 34.1 g/dL (ref 30.0–36.0)
MCV: 91.1 fL (ref 80.0–100.0)
Platelets: 299 10*3/uL (ref 150–400)
RBC: 3.93 MIL/uL (ref 3.87–5.11)
RDW: 11.8 % (ref 11.5–15.5)
WBC: 7.5 10*3/uL (ref 4.0–10.5)
nRBC: 0 % (ref 0.0–0.2)

## 2020-04-28 LAB — I-STAT BETA HCG BLOOD, ED (MC, WL, AP ONLY): I-stat hCG, quantitative: <5 m[IU]/mL (ref <5)

## 2020-04-28 LAB — LIPASE, BLOOD: Lipase: 20 U/L (ref 11–51)

## 2020-04-28 LAB — CBG MONITORING, ED: Glucose-Capillary: 266 mg/dL — ABNORMAL HIGH (ref 70–99)

## 2020-04-28 NOTE — ED Triage Notes (Addendum)
Pt arrives to ED via [facility] w/ c/o hyperglycemia. CBG 333 w/ EMS. Pt also c/o abdominal pain with n/v. Pt received 4 mg IV zofran w/ EMS. EMS VSS.

## 2020-04-29 LAB — URINALYSIS, ROUTINE W REFLEX MICROSCOPIC
Bacteria, UA: NONE SEEN
Bilirubin Urine: NEGATIVE
Glucose, UA: >=500 mg/dL — AB
Hgb urine dipstick: NEGATIVE
Ketones, ur: 80 mg/dL — AB
Leukocytes,Ua: NEGATIVE
Nitrite: NEGATIVE
Protein, ur: NEGATIVE mg/dL
Specific Gravity, Urine: 1.026 (ref 1.005–1.030)
pH: 6 (ref 5.0–8.0)

## 2020-04-29 LAB — BASIC METABOLIC PANEL
Anion gap: 15 (ref 5–15)
BUN: 12 mg/dL (ref 6–20)
CO2: 19 mmol/L — ABNORMAL LOW (ref 22–32)
Calcium: 9 mg/dL (ref 8.9–10.3)
Chloride: 104 mmol/L (ref 98–111)
Creatinine, Ser: 0.93 mg/dL (ref 0.44–1.00)
GFR calc Af Amer: >60 mL/min (ref >60)
GFR calc non Af Amer: >60 mL/min (ref >60)
Glucose, Bld: 295 mg/dL — ABNORMAL HIGH (ref 70–99)
Potassium: 4.5 mmol/L (ref 3.5–5.1)
Sodium: 138 mmol/L (ref 135–145)

## 2020-04-29 LAB — I-STAT VENOUS BLOOD GAS, ED
Acid-base deficit: 9 mmol/L — ABNORMAL HIGH (ref 0.0–2.0)
Bicarbonate: 16.5 mmol/L — ABNORMAL LOW (ref 20.0–28.0)
Calcium, Ion: 1.22 mmol/L (ref 1.15–1.40)
HCT: 37 % (ref 36.0–46.0)
Hemoglobin: 12.6 g/dL (ref 12.0–15.0)
O2 Saturation: 97 %
Potassium: 4.5 mmol/L (ref 3.5–5.1)
Sodium: 138 mmol/L (ref 135–145)
TCO2: 18 mmol/L — ABNORMAL LOW (ref 22–32)
pCO2, Ven: 33.6 mmHg — ABNORMAL LOW (ref 44.0–60.0)
pH, Ven: 7.3 (ref 7.250–7.430)
pO2, Ven: 103 mmHg — ABNORMAL HIGH (ref 32.0–45.0)

## 2020-04-29 LAB — CBG MONITORING, ED
Glucose-Capillary: 377 mg/dL — ABNORMAL HIGH (ref 70–99)
Glucose-Capillary: 292 mg/dL — ABNORMAL HIGH (ref 70–99)
Glucose-Capillary: 305 mg/dL — ABNORMAL HIGH (ref 70–99)

## 2020-04-29 LAB — BETA-HYDROXYBUTYRIC ACID: Beta-Hydroxybutyric Acid: 5.7 mmol/L — ABNORMAL HIGH (ref 0.05–0.27)

## 2020-04-29 MED ORDER — INSULIN ASPART 100 UNIT/ML ~~LOC~~ SOLN
8.0000 [IU] | Freq: Once | SUBCUTANEOUS | Status: AC
  Administered 2020-04-29: 8 [IU] via SUBCUTANEOUS

## 2020-04-29 MED ORDER — PROMETHAZINE HCL 25 MG PO TABS: 25.0000 mg | ORAL_TABLET | Freq: Four times a day (QID) | ORAL | 0 refills | Status: DC | PRN

## 2020-04-29 MED ORDER — METOCLOPRAMIDE HCL 5 MG/ML IJ SOLN
10.0000 mg | Freq: Once | INTRAMUSCULAR | Status: AC
  Administered 2020-04-29: 10 mg via INTRAVENOUS
  Filled 2020-04-29: qty 2

## 2020-04-29 MED ORDER — SODIUM CHLORIDE 0.9 % IV BOLUS
1000.0000 mL | Freq: Once | INTRAVENOUS | Status: AC
  Administered 2020-04-29: 1000 mL via INTRAVENOUS

## 2020-04-29 MED ORDER — INSULIN ASPART 100 UNIT/ML ~~LOC~~ SOLN
5.0000 [IU] | Freq: Once | SUBCUTANEOUS | Status: AC
  Administered 2020-04-29: 5 [IU] via SUBCUTANEOUS

## 2020-04-29 MED ORDER — ONDANSETRON HCL 4 MG/2ML IJ SOLN: 4.0000 mg | Freq: Once | INTRAMUSCULAR | Status: DC

## 2020-04-29 NOTE — ED Notes (Signed)
Pt. Aware that urine is needed. States she is unable to provide any at this time.

## 2020-04-29 NOTE — ED Notes (Signed)
Patient verbalizes understanding of discharge instructions. Opportunity for questioning and answers were provided. Armband removed by staff, pt discharged from ED. Pt. ambulatory and discharged home.  

## 2020-04-29 NOTE — ED Notes (Signed)
Pt called out on call bell and states she vomited in bed.  Pt found on knees at bottom of stretcher actively vomiting.  Stretcher completely full of mainly clear emesis.  PT states she went to get up and was unable to reach for emesis bag that was at head of bed.  Pt moved over to chair.  Gown changed.  Linens changed and bed cleaned.  Pt assisted back to bed and placed on BP monitor and pulse ox.  Call bell and emesis bag within reach.  Pt states she is unable to provide urine specimen at this time.

## 2020-04-29 NOTE — ED Notes (Signed)
Ambulatory to bathroom.

## 2020-04-29 NOTE — ED Notes (Signed)
Provided pt with ice water for PO challenge

## 2020-04-29 NOTE — Discharge Instructions (Signed)
Please follow up with your PCP regarding your ED visit today. Continue taking  your insulin as prescribed until you can follow up with them. I have prescribed additional nausea medication to help.   Return to the ED IMMEDIATELY for any worsening symptoms including persistent vomiting, worsening abdominal pain, fevers > 100.4, chest pain, shortness of breath, or any other new/concerning symptoms.

## 2020-04-29 NOTE — ED Provider Notes (Signed)
[facility] EMERGENCY DEPARTMENT Provider Note   CSN: [id number] Arrival date & time: 04/28/20  2029     History Chief Complaint  Patient presents with  . Hyperglycemia    [name] is a 34 y.o. female with PMHx type 1 DM, Neuropathy, and hx of noncompliance who presents to the ED via EMS for hyperglycemia. Per triage report CBG was 333 with EMS arrival. She was provided 4 mg IV Zofran en route. Pt reports that she has not taken her insulin since Thursday night (2 days ago). She reports she has not been able to eat or drink anything and therefore did not want to take her insulin and bottom out. Of note pt was seen in the ED on 09/04 for similar complaints. She reports she has ODT zofran at home however it has not been helping. Pt denies fevers, chills, chest pain, SOB, diarrhea, constipation, urinary sx, or any other associated symptoms. She is currently on her menstrual cycle. Reports last normal bowel movement earlier today while in the waiting room.   The history is provided by the patient and medical records.       Past Medical History:  Diagnosis Date  . Chronic painful diabetic neuropathy ([place])   . Diabetes mellitus    Type 1  . History of chicken pox   . Neuropathy 07/2012    Patient Active Problem List   Diagnosis Date Noted  . Diabetic polyneuropathy associated with type 1 diabetes mellitus ([place]) 03/14/2019  . Non compliance with medical treatment 03/14/2019  . DKA (diabetic ketoacidoses) ([place]) 07/21/2018  . DKA, type 1 ([place]) 07/21/2018  . Diabetic peripheral neuropathy associated with type 1 diabetes mellitus ([place]) 11/09/2012  . Uncontrolled type 1 diabetes mellitus with diabetic neuropathy, with long-term current use of insulin ([place]) 07/03/2011    Class: Acute    Past Surgical History:  Procedure Laterality Date  . CESAREAN SECTION     only one C/S per pt.  Marland Kitchen DILATION AND CURETTAGE OF UTERUS       OB History    Gravida  4     Para  2   Term  2   Preterm      AB  2   Living  2     SAB      TAB  2   Ectopic      Multiple      Live Births              Family History  Problem Relation Age of Onset  . Hypertension Mother   . Neuropathy Neg Hx     Social History   Tobacco Use  . Smoking status: Never Smoker  . Smokeless tobacco: Never Used  Vaping Use  . Vaping Use: Never used  Substance Use Topics  . Alcohol use: No  . Drug use: Yes    Types: Marijuana    Home Medications Prior to Admission medications   Medication Sig Start Date End Date Taking? Authorizing Provider  BELBUCA 150 MCG FILM Take 150 mg by mouth in the morning and at bedtime. 12/29/19   [provider]  Continuous Blood Gluc Receiver (FREESTYLE LIBRE 14 DAY READER) DEVI Apply 1 Device topically continuous. E10.65 10/27/19   Forrest Moron, MD  Continuous Blood Gluc Sensor (FREESTYLE LIBRE 14 DAY SENSOR) MISC Apply 1 Device topically every 14 (fourteen) days. E10.65 10/27/19   Forrest Moron, MD  GRALISE 600 MG TABS Take 600 mg by  mouth in the morning, at noon, and at bedtime. 03/30/20   [provider]  hydrOXYzine (ATARAX/VISTARIL) 25 MG tablet Take 1 tablet (25 mg total) by mouth every 6 (six) hours as needed for anxiety. 04/23/20   Tacy Learn, PA-C  insulin aspart (NOVOLOG FLEXPEN) 100 UNIT/ML FlexPen Inject 10 Units into the skin 3 (three) times daily with meals. plus sliding scale 10/27/19   Delia Chimes A, MD  insulin detemir (LEVEMIR) 100 UNIT/ML injection Inject 0.4 mLs (40 Units total) into the skin 2 (two) times daily. 10/27/19   Forrest Moron, MD  Insulin Pen Needle (B-D UF III MINI PEN NEEDLES) 31G X 5 MM MISC To use with Insulin pens, as directed 11/01/19   Delia Chimes A, MD  Insulin Pen Needle 31G X 5 MM MISC To use with insulin 11/01/19   Forrest Moron, MD  Insulin Syringe-Needle U-100 (INSULIN SYRINGE 1CC/31GX5/16") 31G X 5/16" 1 ML MISC To use with insulin 11/01/19    Stallings, Zoe A, MD  lidocaine (XYLOCAINE) 5 % ointment Apply 1 application topically at bedtime. To feet Patient not taking: Reported on 11/24/2019 03/11/19   Melvenia Beam, MD  nortriptyline (PAMELOR) 25 MG capsule Take 1 capsule (25 mg total) by mouth at bedtime. Patient not taking: Reported on 04/23/2020 01/25/19   Shelda Pal, DO  promethazine (PHENERGAN) 25 MG tablet Take 1 tablet (25 mg total) by mouth every 6 (six) hours as needed for up to 7 days for nausea or vomiting. 04/29/20 05/06/20  Eustaquio Maize, PA-C  Vitamin D, Ergocalciferol, (DRISDOL) 50000 units CAPS capsule Take 1 capsule (50,000 Units total) by mouth every 7 (seven) days. Patient not taking: Reported on 04/23/2020 05/04/18   Saguier, Percell Miller, PA-C  witch hazel-glycerin (TUCKS) pad Apply 1 application topically as needed for itching. Patient not taking: Reported on 04/23/2020 10/27/19   Forrest Moron, MD    Allergies    Pramipexole  Review of Systems   Review of Systems  Constitutional: Negative for chills and fever.  Respiratory: Negative for shortness of breath.   Cardiovascular: Negative for chest pain.  Gastrointestinal: Positive for abdominal pain, nausea and vomiting. Negative for constipation and diarrhea.  Genitourinary: Negative for difficulty urinating, dysuria, flank pain and menstrual problem.  All other systems reviewed and are negative.   Physical Exam Updated Vital Signs BP 110/80 (BP Location: Right Arm)   Pulse 62   Temp 98 F (36.7 C) (Oral)   Resp 18   SpO2 100%   Physical Exam Vitals and nursing note reviewed.  Constitutional:      Appearance: She is not diaphoretic.     Comments: Uncomfortable appearing female; writhing around in bed  HENT:     Head: Normocephalic and atraumatic.     Mouth/Throat:     Mouth: Mucous membranes are dry.  Eyes:     Conjunctiva/sclera: Conjunctivae normal.  Cardiovascular:     Rate and Rhythm: Normal rate and regular rhythm.  Pulmonary:      Effort: Pulmonary effort is normal.     Breath sounds: Normal breath sounds. No wheezing, rhonchi or rales.  Abdominal:     Palpations: Abdomen is soft.     Tenderness: There is abdominal tenderness. There is no guarding or rebound.     Comments: Soft, mild TTP throughout abdomen, +BS throughout, no r/g/r, neg murphy's, neg mcburney's, no CVA TTP  Musculoskeletal:     Cervical back: Neck supple.  Skin:    General: Skin is  warm and dry.  Neurological:     Mental Status: She is alert.     ED Results / Procedures / Treatments   Labs (all labs ordered are listed, but only abnormal results are displayed) Labs Reviewed  CBC - Abnormal; Notable for the following components:      Result Value   HCT 35.8 (*)    All other components within normal limits  URINALYSIS, ROUTINE W REFLEX MICROSCOPIC - Abnormal; Notable for the following components:   Color, Urine STRAW (*)    Glucose, UA >=500 (*)    Ketones, ur 80 (*)    All other components within normal limits  COMPREHENSIVE METABOLIC PANEL - Abnormal; Notable for the following components:   CO2 20 (*)    Glucose, Bld 281 (*)    AST 14 (*)    All other components within normal limits  BETA-HYDROXYBUTYRIC ACID - Abnormal; Notable for the following components:   Beta-Hydroxybutyric Acid 5.70 (*)    All other components within normal limits  BASIC METABOLIC PANEL - Abnormal; Notable for the following components:   CO2 19 (*)    Glucose, Bld 295 (*)    All other components within normal limits  CBG MONITORING, ED - Abnormal; Notable for the following components:   Glucose-Capillary 266 (*)    All other components within normal limits  CBG MONITORING, ED - Abnormal; Notable for the following components:   Glucose-Capillary 377 (*)    All other components within normal limits  I-STAT VENOUS BLOOD GAS, ED - Abnormal; Notable for the following components:   pCO2, Ven 33.6 (*)    pO2, Ven 103.0 (*)    Bicarbonate 16.5 (*)    TCO2 18 (*)     Acid-base deficit 9.0 (*)    All other components within normal limits  CBG MONITORING, ED - Abnormal; Notable for the following components:   Glucose-Capillary 292 (*)    All other components within normal limits  CBG MONITORING, ED - Abnormal; Notable for the following components:   Glucose-Capillary 305 (*)    All other components within normal limits  LIPASE, BLOOD  I-STAT BETA HCG BLOOD, ED ([facility], [facility], [facility] ONLY)    EKG EKG Interpretation  Date/Time:  Saturday April 29 2020 08:35:56 EDT Ventricular Rate:  87 PR Interval:    QRS Duration: 71 QT Interval:  364 QTC Calculation: 438 R Axis:   73 Text Interpretation: Sinus rhythm Probable left atrial enlargement No STEMI Confirmed by Octaviano Glow [phone]) on 04/29/2020 9:38:21 AM   Radiology No results found.  Procedures Procedures (including critical care time)  Medications Ordered in ED Medications  sodium chloride 0.9 % bolus 1,000 mL (0 mLs Intravenous Stopped 04/29/20 0948)  insulin aspart (novoLOG) injection 8 Units (8 Units Subcutaneous Given 04/29/20 0836)  metoCLOPramide (REGLAN) injection 10 mg (10 mg Intravenous Given 04/29/20 0837)  sodium chloride 0.9 % bolus 1,000 mL (0 mLs Intravenous Stopped 04/29/20 1145)  insulin aspart (novoLOG) injection 5 Units (5 Units Subcutaneous Given 04/29/20 1145)  insulin aspart (novoLOG) injection 5 Units (5 Units Subcutaneous Given 04/29/20 1250)    ED Course  I have reviewed the triage vital signs and the nursing notes.  Pertinent labs & imaging results that were available during my care of the patient were reviewed by me and considered in my medical decision making (see chart for details).  Clinical Course as of Apr 29 1352  Sat Apr 29, 2020  1046 Ketones, ur(!): 80 [MV]  Clinical Course User Index [MV] Eustaquio Maize, PA-C   MDM Rules/Calculators/A&P                          34 year old female with a history of type 1 diabetes who presents to the ED today via  EMS for hyperglycemia, nausea, vomiting, abdominal pain.  Has not taken her insulin in 2 days time as she has not been able to keep anything down.  She has history of same, seen on 0904 for same.  States she has ODT Zofran at home without relief.  On EMS arrival patient's sugar was 333.  She had lab work done and placed in the waiting room.  CBC without leukocytosis, hemoglobin stable at 12.2.  CMP with a glucose 281, bicarb 20.  Sodium 136, no gap.  Lipase 28.  Beta hCG negative.  After approximately 11-1/2 hours in the waiting room patient was brought back to her room, repeat elevated at 377. On exam pt with mild diffuse abdominal TTP however no rebound or guarding; abdomen is soft. Doubt acute abdomen at this time. Suspect pt has some degree of gastroparesis causing this. Given elevation in sugar and the labs being approximately 69 hours old will plan for i-STAT VBG to assess for concern for DKA, patient has history of same.  Will provide 8 units insulin, fluids, Reglan.  Still awaiting urinalysis.  We will continue to monitor.  VBG with normal pH at 7.30. Bicarb 16.5. Acid base deficit of 9.0. Does appear to be compensating with decreased pCO2 33.6. Pt does not appear to be in full blown DKA at this time. Will plan to provide fluids and rechecked CBG after insulin. Will fluid challenge; if able to pass fluids will plan to discharge home with close PCP follow up. Discussed case with attending physician Dr. Langston Masker who agrees with plan.   Repeat CBG 292; will provide fluid challenge at this time  CBG rechecked prior to fluid challenge and somehow increased to 305. Unsure if pt is eating in the room unsupervised. Will provide additional 10 units and repeat BMP as the last labs were taken at about 8 PM last night. Pt has tolerated water in the ED without difficulty. If no signs of elevated gap on BMP will discharge home.   Repeat BMP with closed gap. Pt tolerating fluids at this time. Will plan to discharge  with close PCP follow up. Will provide stronger antiemetic for patient. Strict return precautions have been discussed with pt. She is in agreement with plan and stable for discharge home.   This note was prepared using Dragon voice recognition software and may include unintentional dictation errors due to the inherent limitations of voice recognition software.   Final Clinical Impression(s) / ED Diagnoses Final diagnoses:  Hyperglycemia  Non-intractable vomiting with nausea, unspecified vomiting type    Rx / DC Orders ED Discharge Orders         Ordered    promethazine (PHENERGAN) 25 MG tablet  Every 6 hours PRN        04/29/20 1352           Discharge Instructions     Please follow up with your PCP regarding your ED visit today. Continue taking  your insulin as prescribed until you can follow up with them. I have prescribed additional nausea medication to help.   Return to the ED IMMEDIATELY for any worsening symptoms including persistent vomiting, worsening abdominal pain, fevers > 100.4, chest  pain, shortness of breath, or any other new/concerning symptoms.        Eustaquio Maize, PA-C 04/29/20 1353    Wyvonnia Dusky, MD 04/29/20 [phone]

## 2020-07-06 ENCOUNTER — Other Ambulatory Visit (HOSPITAL_COMMUNITY)
Admission: RE | Admit: 2020-07-06 | Discharge: 2020-07-06 | Disposition: A | Discharge status: Home / Self Care | Payer: BC Managed Care – PPO | Source: Ambulatory Visit | Attending: Family Medicine | Admitting: Family Medicine

## 2020-07-06 ENCOUNTER — Other Ambulatory Visit: Payer: Self-pay

## 2020-07-06 ENCOUNTER — Encounter: Payer: Self-pay | Admitting: Family Medicine

## 2020-07-06 ENCOUNTER — Ambulatory Visit (INDEPENDENT_AMBULATORY_CARE_PROVIDER_SITE_OTHER): Payer: BC Managed Care – PPO | Admitting: Family Medicine

## 2020-07-06 VITALS — BP 119/79 | HR 103 | Temp 98.1°F | Ht 66.0 in | Wt 161.0 lb

## 2020-07-06 DIAGNOSIS — Z3042 Encounter for surveillance of injectable contraceptive: Secondary | ICD-10-CM | POA: Diagnosis not present

## 2020-07-06 DIAGNOSIS — Z794 Long term (current) use of insulin: Secondary | ICD-10-CM

## 2020-07-06 DIAGNOSIS — Z113 Encounter for screening for infections with a predominantly sexual mode of transmission: Secondary | ICD-10-CM

## 2020-07-06 DIAGNOSIS — E1165 Type 2 diabetes mellitus with hyperglycemia: Secondary | ICD-10-CM

## 2020-07-06 DIAGNOSIS — E1042 Type 1 diabetes mellitus with diabetic polyneuropathy: Secondary | ICD-10-CM

## 2020-07-06 DIAGNOSIS — Z1159 Encounter for screening for other viral diseases: Secondary | ICD-10-CM

## 2020-07-06 DIAGNOSIS — R300 Dysuria: Secondary | ICD-10-CM

## 2020-07-06 DIAGNOSIS — E559 Vitamin D deficiency, unspecified: Secondary | ICD-10-CM

## 2020-07-06 DIAGNOSIS — K649 Unspecified hemorrhoids: Secondary | ICD-10-CM

## 2020-07-06 LAB — POCT URINALYSIS DIP (MANUAL ENTRY)
Bilirubin, UA: NEGATIVE
Blood, UA: NEGATIVE
Glucose, UA: >=1000 mg/dL — AB
Ketones, POC UA: NEGATIVE mg/dL
Leukocytes, UA: NEGATIVE
Nitrite, UA: NEGATIVE
Protein Ur, POC: NEGATIVE mg/dL
Spec Grav, UA: 1.02 (ref 1.010–1.025)
Urobilinogen, UA: 0.2 E.U./dL
pH, UA: 5.5 (ref 5.0–8.0)

## 2020-07-06 LAB — POCT WET + KOH PREP
Trich by wet prep: ABSENT
Yeast by KOH: ABSENT
Yeast by wet prep: ABSENT

## 2020-07-06 LAB — POCT GLYCOSYLATED HEMOGLOBIN (HGB A1C): Hemoglobin A1C: 14 % — AB (ref 4.0–5.6)

## 2020-07-06 LAB — POCT URINE PREGNANCY: Preg Test, Ur: NEGATIVE

## 2020-07-06 MED ORDER — INSULIN PEN NEEDLE 31G X 5 MM MISC: 0 refills | Status: DC

## 2020-07-06 MED ORDER — BD PEN NEEDLE MINI U/F 31G X 5 MM MISC: 0 refills | Status: DC

## 2020-07-06 MED ORDER — ROSUVASTATIN CALCIUM 10 MG PO TABS: 10.0000 mg | ORAL_TABLET | Freq: Every day | ORAL | 3 refills | Status: DC

## 2020-07-06 MED ORDER — MEDROXYPROGESTERONE ACETATE 150 MG/ML IM SUSP: 150.0000 mg | Freq: Once | INTRAMUSCULAR | Status: DC

## 2020-07-06 MED ORDER — INSULIN DETEMIR 100 UNIT/ML ~~LOC~~ SOLN: 65.0000 [IU] | Freq: Every day | SUBCUTANEOUS | 1 refills | Status: DC

## 2020-07-06 MED ORDER — "INSULIN SYRINGE 31G X 5/16"" 1 ML MISC": 0 refills | Status: DC

## 2020-07-06 MED ORDER — INSULIN DETEMIR 100 UNIT/ML ~~LOC~~ SOLN: 60.0000 [IU] | Freq: Every day | SUBCUTANEOUS | 1 refills | Status: DC

## 2020-07-06 MED ORDER — NOVOLOG FLEXPEN 100 UNIT/ML ~~LOC~~ SOPN: 10.0000 [IU] | PEN_INJECTOR | Freq: Three times a day (TID) | SUBCUTANEOUS | 0 refills | Status: DC

## 2020-07-06 NOTE — Progress Notes (Signed)
11/18/20215:34 PM  [name] [birth year], 34 y.o., female [id number]  Chief Complaint  Patient presents with  . std testing request    rash and itching  . yeast infection testing / urine test  . depo injection  . Diabetes    medication manage    HPI:   Patient is a 34 y.o. female with past medical history significant for DM who presents today for medication follow up  Pain Doctor: Dr. Zannie Cove ([facility]) Oxycotin, Gralise?, Venlafaxine  Peripheral neuropathy extreme the worst in feet and legs  Diabetes 10 plus years diagnosed In September was in the ED was in the 300s Symptomatic with hypo and hyper glycemia Fasting 150-190 During the Day 250 Levemir: 60 units daily Novolog: Sliding scale approx 10 units 3 times per day Goal BG 120 Denies episodes of hypoglycemia  Lab Results  Component Value Date   HGBA1C 14.0 (A) 07/06/2020    Works from home Hemmorrhoids This has been an issue since she had her child Has tried all over the counter methods    Depression screen [facility] 2/9 07/06/2020 11/24/2019 10/27/2019  Decreased Interest 0 0 0  Down, Depressed, Hopeless 0 0 0  PHQ - 2 Score 0 0 0  Altered sleeping - - -  Tired, decreased energy - - -  Change in appetite - - -  Feeling bad or failure about yourself  - - -  Trouble concentrating - - -  Moving slowly or fidgety/restless - - -  Suicidal thoughts - - -  PHQ-9 Score - - -    Fall Risk  07/06/2020 11/24/2019 10/27/2019  Falls in the past year? 0 0 0  Number falls in past yr: 0 0 0  Injury with Fall? 0 0 0  Follow up Falls evaluation completed Falls evaluation completed Falls evaluation completed     Allergies  Allergen Reactions  . Pramipexole Shortness Of Breath    Prior to Admission medications   Medication Sig Start Date End Date Taking? Authorizing Provider  BELBUCA 150 MCG FILM Take 150 mg by mouth in the morning and at bedtime. 12/29/19   [provider]    Continuous Blood Gluc Receiver (FREESTYLE LIBRE 14 DAY READER) DEVI Apply 1 Device topically continuous. E10.65 10/27/19   Forrest Moron, MD  Continuous Blood Gluc Sensor (FREESTYLE LIBRE 14 DAY SENSOR) MISC Apply 1 Device topically every 14 (fourteen) days. E10.65 10/27/19   Forrest Moron, MD  GRALISE 600 MG TABS Take 600 mg by mouth in the morning, at noon, and at bedtime. 03/30/20   [provider]  hydrOXYzine (ATARAX/VISTARIL) 25 MG tablet Take 1 tablet (25 mg total) by mouth every 6 (six) hours as needed for anxiety. 04/23/20   Tacy Learn, PA-C  insulin aspart (NOVOLOG FLEXPEN) 100 UNIT/ML FlexPen Inject 10 Units into the skin 3 (three) times daily with meals. plus sliding scale 10/27/19   Delia Chimes A, MD  insulin detemir (LEVEMIR) 100 UNIT/ML injection Inject 0.4 mLs (40 Units total) into the skin 2 (two) times daily. 10/27/19   Forrest Moron, MD  Insulin Pen Needle (B-D UF III MINI PEN NEEDLES) 31G X 5 MM MISC To use with Insulin pens, as directed 11/01/19   Delia Chimes A, MD  Insulin Pen Needle 31G X 5 MM MISC To use with insulin 11/01/19   Forrest Moron, MD  Insulin Syringe-Needle U-100 (INSULIN SYRINGE 1CC/31GX5/16") 31G X 5/16" 1 ML MISC To use with insulin  11/01/19   Forrest Moron, MD  lidocaine (XYLOCAINE) 5 % ointment Apply 1 application topically at bedtime. To feet Patient not taking: Reported on 11/24/2019 03/11/19   Melvenia Beam, MD  nortriptyline (PAMELOR) 25 MG capsule Take 1 capsule (25 mg total) by mouth at bedtime. Patient not taking: Reported on 04/23/2020 01/25/19   Shelda Pal, DO  promethazine (PHENERGAN) 25 MG tablet Take 1 tablet (25 mg total) by mouth every 6 (six) hours as needed for up to 7 days for nausea or vomiting. 04/29/20 05/06/20  Eustaquio Maize, PA-C  Vitamin D, Ergocalciferol, (DRISDOL) 50000 units CAPS capsule Take 1 capsule (50,000 Units total) by mouth every 7 (seven) days. Patient not taking: Reported on 04/23/2020  05/04/18   Saguier, Percell Miller, PA-C  witch hazel-glycerin (TUCKS) pad Apply 1 application topically as needed for itching. Patient not taking: Reported on 04/23/2020 10/27/19   Forrest Moron, MD    Past Medical History:  Diagnosis Date  . Chronic painful diabetic neuropathy ([place])   . Diabetes mellitus    Type 1  . History of chicken pox   . Neuropathy 07/2012    Past Surgical History:  Procedure Laterality Date  . CESAREAN SECTION     only one C/S per pt.  Marland Kitchen DILATION AND CURETTAGE OF UTERUS      Social History   Tobacco Use  . Smoking status: Never Smoker  . Smokeless tobacco: Never Used  Substance Use Topics  . Alcohol use: No    Family History  Problem Relation Age of Onset  . Hypertension Mother   . Neuropathy Neg Hx     Review of Systems  Constitutional: Negative for chills, fever and malaise/fatigue.  Eyes: Negative for blurred vision and double vision.  Respiratory: Negative for cough, shortness of breath and wheezing.   Cardiovascular: Negative for chest pain, palpitations and leg swelling.  Gastrointestinal: Negative for abdominal pain, blood in stool, constipation, diarrhea, heartburn, nausea and vomiting.  Genitourinary: Negative for dysuria, frequency and hematuria.  Musculoskeletal: Negative for back pain and joint pain.  Skin: Negative for rash.  Neurological: Positive for tingling. Negative for dizziness, weakness and headaches.     OBJECTIVE:  Today's Vitals   07/06/20 1542  BP: 119/79  Pulse: (!) 103  Temp: 98.1 F (36.7 C)  SpO2: 99%  Weight: 161 lb (73 kg)  Height: 5\' 6"  (1.676 m)   Body mass index is 25.99 kg/m.   Physical Exam Constitutional:      General: She is not in acute distress.    Appearance: Normal appearance. She is not ill-appearing.  HENT:     Head: Normocephalic.  Cardiovascular:     Rate and Rhythm: Normal rate and regular rhythm.     Pulses: Normal pulses.     Heart sounds: Normal heart sounds. No murmur heard.   No friction rub. No gallop.   Pulmonary:     Effort: Pulmonary effort is normal. No respiratory distress.     Breath sounds: Normal breath sounds. No stridor. No wheezing, rhonchi or rales.  Abdominal:     General: Bowel sounds are normal.     Palpations: Abdomen is soft.     Tenderness: There is no abdominal tenderness.  Musculoskeletal:     Right lower leg: No edema.     Left lower leg: No edema.  Skin:    General: Skin is warm and dry.  Neurological:     Mental Status: She is alert and oriented to person,  place, and time.  Psychiatric:        Mood and Affect: Mood normal.        Behavior: Behavior normal.     Results for orders placed or performed in visit on 07/06/20 (from the past 24 hour(s))  POCT urine pregnancy     Status: Normal   Collection Time: 07/06/20  4:00 PM  Result Value Ref Range   Preg Test, Ur Negative Negative  POCT urinalysis dipstick     Status: Abnormal   Collection Time: 07/06/20  4:02 PM  Result Value Ref Range   Color, UA yellow yellow   Clarity, UA clear clear   Glucose, UA >=1,000 (A) negative mg/dL   Bilirubin, UA negative negative   Ketones, POC UA negative negative mg/dL   Spec Grav, UA 1.020 1.010 - 1.025   Blood, UA negative negative   pH, UA 5.5 5.0 - 8.0   Protein Ur, POC negative negative mg/dL   Urobilinogen, UA 0.2 0.2 or 1.0 E.U./dL   Nitrite, UA Negative Negative   Leukocytes, UA Negative Negative  POCT Wet + KOH Prep     Status: Abnormal   Collection Time: 07/06/20  4:17 PM  Result Value Ref Range   Yeast by KOH Absent Absent   Yeast by wet prep Absent Absent   WBC by wet prep None (A) Few   Clue Cells Wet Prep HPF POC None None   Trich by wet prep Absent Absent   Bacteria Wet Prep HPF POC Many (A) Few   Epithelial Cells By [employer] ([facility]) Few None, Few, Too numerous to count   RBC,UR,HPF,POC None None RBC/hpf  POCT glycosylated hemoglobin (Hb A1C)     Status: Abnormal   Collection Time: 07/06/20  4:26 PM  Result Value  Ref Range   Hemoglobin A1C 14.0 (A) 4.0 - 5.6 %   HbA1c POC (<> result, manual entry)     HbA1c, POC (prediabetic range)     HbA1c, POC (controlled diabetic range)      No results found.   ASSESSMENT and PLAN  Problem List Items Addressed This Visit      Endocrine   Diabetic peripheral neuropathy associated with type 1 diabetes mellitus (HCC)   Relevant Medications   insulin detemir (LEVEMIR) 100 UNIT/ML injection   insulin aspart (NOVOLOG FLEXPEN) 100 UNIT/ML FlexPen   rosuvastatin (CRESTOR) 10 MG tablet    Other Visit Diagnoses    Depo-Provera contraceptive status    -  Primary   Relevant Orders   POCT urine pregnancy (Completed)   Screen for STD (sexually transmitted disease)       Relevant Orders   RPR   GC/Chlamydia probe amp ([place])not at [facility]   Type 2 diabetes mellitus with hyperglycemia, with long-term current use of insulin (HCC)       Relevant Medications   insulin detemir (LEVEMIR) 100 UNIT/ML injection   insulin aspart (NOVOLOG FLEXPEN) 100 UNIT/ML FlexPen   Insulin Pen Needle (B-D UF III MINI PEN NEEDLES) 31G X 5 MM MISC   Insulin Pen Needle 31G X 5 MM MISC   Insulin Syringe-Needle U-100 (INSULIN SYRINGE 1CC/31GX5/16") 31G X 5/16" 1 ML MISC   rosuvastatin (CRESTOR) 10 MG tablet   Other Relevant Orders   POCT glycosylated hemoglobin (Hb A1C) (Completed)   Comprehensive metabolic panel   Ambulatory referral to Endocrinology   Encounter for hepatitis C screening test for low risk patient       Relevant Orders   Hepatitis  C antibody   Vitamin D deficiency       Relevant Orders   Vitamin D, 25-hydroxy   Dysuria       Relevant Orders   POCT Wet + KOH Prep (Completed)   POCT urinalysis dipstick (Completed)   Hemorrhoids, unspecified hemorrhoid type       Relevant Medications   rosuvastatin (CRESTOR) 10 MG tablet   Other Relevant Orders   Ambulatory referral to General Surgery     Per A1C increased Levemir to 65 units per night Instructed to keep  close monitoring and we will have a follow up appointment in a week.  Return in about 3 months (around 10/06/2020) for Medication follow up.    Huston Foley Ivelisse Culverhouse, FNP-BC Primary [facility] at [place], [place] [address] Ph.  [phone] Fax [phone]

## 2020-07-06 NOTE — Patient Instructions (Addendum)
Diabetes Basics  Diabetes (diabetes mellitus) is a long-term (chronic) disease. It occurs when the body does not properly use sugar (glucose) that is released from food after you eat. Diabetes may be caused by one or both of these problems:  Your pancreas does not make enough of a hormone called insulin.  Your body does not react in a normal way to insulin that it makes. Insulin lets sugars (glucose) go into cells in your body. This gives you energy. If you have diabetes, sugars cannot get into cells. This causes high blood sugar (hyperglycemia). Follow these instructions at home: How is diabetes treated? You may need to take insulin or other diabetes medicines daily to keep your blood sugar in balance. Take your diabetes medicines every day as told by your doctor. List your diabetes medicines here: Diabetes medicines  Name of medicine: ______________________________ ? Amount (dose): _______________ Time (a.m./p.m.): _______________ Notes: ___________________________________  Name of medicine: ______________________________ ? Amount (dose): _______________ Time (a.m./p.m.): _______________ Notes: ___________________________________  Name of medicine: ______________________________ ? Amount (dose): _______________ Time (a.m./p.m.): _______________ Notes: ___________________________________ If you use insulin, you will learn how to give yourself insulin by injection. You may need to adjust the amount based on the food that you eat. List the types of insulin you use here: Insulin  Insulin type: ______________________________ ? Amount (dose): _______________ Time (a.m./p.m.): _______________ Notes: ___________________________________  Insulin type: ______________________________ ? Amount (dose): _______________ Time (a.m./p.m.): _______________ Notes: ___________________________________  Insulin type: ______________________________ ? Amount (dose): _______________ Time (a.m./p.m.):  _______________ Notes: ___________________________________  Insulin type: ______________________________ ? Amount (dose): _______________ Time (a.m./p.m.): _______________ Notes: ___________________________________  Insulin type: ______________________________ ? Amount (dose): _______________ Time (a.m./p.m.): _______________ Notes: ___________________________________ How do I manage my blood sugar?  Check your blood sugar levels using a blood glucose monitor as directed by your doctor. Your doctor will set treatment goals for you. Generally, you should have these blood sugar levels:  Before meals (preprandial): 80-130 mg/dL (4.4-7.2 mmol/L).  After meals (postprandial): below 180 mg/dL (10 mmol/L).  A1c level: less than 7%. Write down the times that you will check your blood sugar levels: Blood sugar checks  Time: _______________ Notes: ___________________________________  Time: _______________ Notes: ___________________________________  Time: _______________ Notes: ___________________________________  Time: _______________ Notes: ___________________________________  Time: _______________ Notes: ___________________________________  Time: _______________ Notes: ___________________________________  What do I need to know about low blood sugar? Low blood sugar is called hypoglycemia. This is when blood sugar is at or below 70 mg/dL (3.9 mmol/L). Symptoms may include:  Feeling: ? Hungry. ? Worried or nervous (anxious). ? Sweaty and clammy. ? Confused. ? Dizzy. ? Sleepy. ? Sick to your stomach (nauseous).  Having: ? A fast heartbeat. ? A headache. ? A change in your vision. ? Tingling or no feeling (numbness) around the mouth, lips, or tongue. ? Jerky movements that you cannot control (seizure).  Having trouble with: ? Moving (coordination). ? Sleeping. ? Passing out (fainting). ? Getting upset easily (irritability). Treating low blood sugar To treat low blood  sugar, eat or drink something sugary right away. If you can think clearly and swallow safely, follow the 15:15 rule:  Take 15 grams of a fast-acting carb (carbohydrate). Talk with your doctor about how much you should take.  Some fast-acting carbs are: ? Sugar tablets (glucose pills). Take 3-4 glucose pills. ? 6-8 pieces of hard candy. ? 4-6 oz (120-150 mL) of fruit juice. ? 4-6 oz (120-150 mL) of regular (not diet) soda. ? 1 Tbsp (15 mL) honey or sugar.    Check your blood sugar 15 minutes after you take the carb.  If your blood sugar is still at or below 70 mg/dL (3.9 mmol/L), take 15 grams of a carb again.  If your blood sugar does not go above 70 mg/dL (3.9 mmol/L) after 3 tries, get help right away.  After your blood sugar goes back to normal, eat a meal or a snack within 1 hour. Treating very low blood sugar If your blood sugar is at or below 54 mg/dL (3 mmol/L), you have very low blood sugar (severe hypoglycemia). This is an emergency. Do not wait to see if the symptoms will go away. Get medical help right away. Call your local emergency services (911 in the [place] Do not drive yourself to the hospital. Questions to ask your health care provider  Do I need to meet with a diabetes educator?  What equipment will I need to care for myself at home?  What diabetes medicines do I need? When should I take them?  How often do I need to check my blood sugar?  What number can I call if I have questions?  When is my next doctor's visit?  Where can I find a support group for people with diabetes? Where to find more information  [place] [employer]: www.diabetes.org  [place] [employer] Diabetes Educators: www.diabeteseducator.org/patient-resources Contact a doctor if:  Your blood sugar is at or above 240 mg/dL (13.3 mmol/L) for 2 days in a row.  You have been sick or have had a fever for 2 days or more, and you are not getting better.  You have any of these  problems for more than 6 hours: ? You cannot eat or drink. ? You feel sick to your stomach (nauseous). ? You throw up (vomit). ? You have watery poop (diarrhea). Get help right away if:  Your blood sugar is lower than 54 mg/dL (3 mmol/L).  You get confused.  You have trouble: ? Thinking clearly. ? Breathing. Summary  Diabetes (diabetes mellitus) is a long-term (chronic) disease. It occurs when the body does not properly use sugar (glucose) that is released from food after digestion.  Take insulin and diabetes medicines as told.  Check your blood sugar every day, as often as told.  Keep all follow-up visits as told by your doctor. This is important. This information is not intended to replace advice given to you by your health care provider. Make sure you discuss any questions you have with your health care provider. Document Revised: 04/28/2019 Document Reviewed: 11/07/2017 Elsevier Patient Education  2020 [name].   Hemorrhoids Hemorrhoids are swollen veins in and around the rectum or anus. There are two types of hemorrhoids:  Internal hemorrhoids. These occur in the veins that are [name] inside the rectum. They may poke through to the outside and become irritated and painful.  External hemorrhoids. These occur in the veins that are outside the anus and can be felt as a painful swelling or hard lump near the anus. Most hemorrhoids do not cause serious problems, and they can be managed with home treatments such as diet and lifestyle changes. If home treatments do not help the symptoms, procedures can be done to shrink or remove the hemorrhoids. What are the causes? This condition is caused by increased pressure in the anal area. This pressure may result from various things, including:  Constipation.  Straining to have a bowel movement.  Diarrhea.  Pregnancy.  Obesity.  Sitting for long periods of time.  Heavy  lifting or other activity that causes you to strain.   Anal sex.  Riding a bike for a long period of time. What are the signs or symptoms? Symptoms of this condition include:  Pain.  Anal itching or irritation.  Rectal bleeding.  Leakage of stool (feces).  Anal swelling.  One or more lumps around the anus. How is this diagnosed? This condition can often be diagnosed through a visual exam. Other exams or tests may also be done, such as:  An exam that involves feeling the rectal area with a gloved hand (digital rectal exam).  An exam of the anal canal that is done using a small tube (anoscope).  A blood test, if you have lost a significant amount of blood.  A test to look inside the colon using a flexible tube with a camera on the end (sigmoidoscopy or colonoscopy). How is this treated? This condition can usually be treated at home. However, various procedures may be done if dietary changes, lifestyle changes, and other home treatments do not help your symptoms. These procedures can help make the hemorrhoids smaller or remove them completely. Some of these procedures involve surgery, and others do not. Common procedures include:  Rubber band ligation. Rubber bands are placed at the base of the hemorrhoids to cut off their blood supply.  Sclerotherapy. Medicine is injected into the hemorrhoids to shrink them.  Infrared coagulation. A type of light energy is used to get rid of the hemorrhoids.  Hemorrhoidectomy surgery. The hemorrhoids are surgically removed, and the veins that supply them are tied off.  Stapled hemorrhoidopexy surgery. The surgeon staples the base of the hemorrhoid to the rectal wall. Follow these instructions at home: Eating and drinking   Eat foods that have a lot of fiber in them, such as whole grains, beans, nuts, fruits, and vegetables.  Ask your health care provider about taking products that have added fiber (fiber supplements).  Reduce the amount of fat in your diet. You can do this by eating  low-fat dairy products, eating less red meat, and avoiding processed foods.  Drink enough fluid to keep your urine pale yellow. Managing pain and swelling   Take warm sitz baths for 20 minutes, 3-4 times a day to ease pain and discomfort. You may do this in a bathtub or using a portable sitz bath that fits over the toilet.  If directed, apply ice to the affected area. Using ice packs between sitz baths may be helpful. ? Put ice in a plastic bag. ? Place a towel between your skin and the bag. ? Leave the ice on for 20 minutes, 2-3 times a day. General instructions  Take over-the-counter and prescription medicines only as told by your health care provider.  Use medicated creams or suppositories as told.  Get regular exercise. Ask your health care provider how much and what kind of exercise is best for you. In general, you should do moderate exercise for at least 30 minutes on most days of the week (150 minutes each week). This can include activities such as walking, biking, or yoga.  Go to the bathroom when you have the urge to have a bowel movement. Do not wait.  Avoid straining to have bowel movements.  Keep the anal area dry and clean. Use wet toilet paper or moist towelettes after a bowel movement.  Do not sit on the toilet for long periods of time. This increases blood pooling and pain.  Keep all follow-up visits as told by  your health care provider. This is important. Contact a health care provider if you have:  Increasing pain and swelling that are not controlled by treatment or medicine.  Difficulty having a bowel movement, or you are unable to have a bowel movement.  Pain or inflammation outside the area of the hemorrhoids. Get help right away if you have:  Uncontrolled bleeding from your rectum. Summary  Hemorrhoids are swollen veins in and around the rectum or anus.  Most hemorrhoids can be managed with home treatments such as diet and lifestyle changes.  Taking  warm sitz baths can help ease pain and discomfort.  In severe cases, procedures or surgery can be done to shrink or remove the hemorrhoids. This information is not intended to replace advice given to you by your health care provider. Make sure you discuss any questions you have with your health care provider. Document Revised: 01/01/2019 Document Reviewed: 12/25/2017 Elsevier Patient Education  [place].

## 2020-07-07 ENCOUNTER — Telehealth: Payer: Self-pay | Admitting: Family Medicine

## 2020-07-07 ENCOUNTER — Other Ambulatory Visit: Payer: Self-pay | Admitting: Family Medicine

## 2020-07-07 DIAGNOSIS — E559 Vitamin D deficiency, unspecified: Secondary | ICD-10-CM

## 2020-07-07 LAB — COMPREHENSIVE METABOLIC PANEL
ALT: 8 IU/L (ref 0–32)
AST: 10 IU/L (ref 0–40)
Albumin/Globulin Ratio: 1.7 (ref 1.2–2.2)
Albumin: 4.2 g/dL (ref 3.8–4.8)
Alkaline Phosphatase: 111 IU/L (ref 44–121)
BUN/Creatinine Ratio: 12 (ref 9–23)
BUN: 8 mg/dL (ref 6–20)
Bilirubin Total: <0.2 mg/dL (ref 0.0–1.2)
CO2: 23 mmol/L (ref 20–29)
Calcium: 9.5 mg/dL (ref 8.7–10.2)
Chloride: 100 mmol/L (ref 96–106)
Creatinine, Ser: 0.66 mg/dL (ref 0.57–1.00)
GFR calc Af Amer: 134 mL/min/{1.73_m2} (ref >59)
GFR calc non Af Amer: 116 mL/min/{1.73_m2} (ref >59)
Globulin, Total: 2.5 g/dL (ref 1.5–4.5)
Glucose: 371 mg/dL — ABNORMAL HIGH (ref 65–99)
Potassium: 4.4 mmol/L (ref 3.5–5.2)
Sodium: 137 mmol/L (ref 134–144)
Total Protein: 6.7 g/dL (ref 6.0–8.5)

## 2020-07-07 LAB — HEPATITIS C ANTIBODY: Hep C Virus Ab: <0.1 s/co ratio (ref 0.0–0.9)

## 2020-07-07 LAB — VITAMIN D 25 HYDROXY (VIT D DEFICIENCY, FRACTURES): Vit D, 25-Hydroxy: 8.4 ng/mL — ABNORMAL LOW (ref 30.0–100.0)

## 2020-07-07 LAB — RPR: RPR Ser Ql: NONREACTIVE

## 2020-07-07 MED ORDER — VITAMIN D (ERGOCALCIFEROL) 1.25 MG (50000 UNIT) PO CAPS: 50000.0000 [IU] | ORAL_CAPSULE | ORAL | 5 refills | Status: DC

## 2020-07-07 NOTE — Telephone Encounter (Signed)
FYI to provider and Melissa.

## 2020-07-07 NOTE — Telephone Encounter (Signed)
She really does need to see endocrinology, is there another practice nearby that we could try? She lives in [place]?

## 2020-07-07 NOTE — Telephone Encounter (Signed)
Caren Griffins calling from [facility] calling . regarding recent referral for this patient . She cannot been seen . Patient discharged from their office       Any questions please call Caren Griffins t [phone]

## 2020-07-10 LAB — GC/CHLAMYDIA PROBE AMP (~~LOC~~) NOT AT ARMC
Chlamydia: NEGATIVE
Comment: NEGATIVE
Comment: NORMAL
Neisseria Gonorrhea: NEGATIVE

## 2020-07-12 ENCOUNTER — Ambulatory Visit: Payer: BC Managed Care – PPO | Admitting: Family Medicine

## 2020-07-17 ENCOUNTER — Encounter: Payer: Self-pay | Admitting: Family Medicine

## 2020-08-08 ENCOUNTER — Telehealth: Payer: Self-pay | Admitting: Family Medicine

## 2020-08-08 ENCOUNTER — Other Ambulatory Visit: Payer: Self-pay | Admitting: Family Medicine

## 2020-08-08 NOTE — Telephone Encounter (Signed)
[facility] to sch a 2  Week f/u  for a diabetes recheck (around 08/22/20). With FNP Huston Foley Just. Please advise.

## 2020-08-08 NOTE — Telephone Encounter (Signed)
-----   Message from [place], FNP sent at 08/08/2020 10:49 AM EST ----- Hello If we could get [name] scheduled for an appointment in the upcoming next two weeks for a diabetes recheck. Thanks!

## 2020-08-21 ENCOUNTER — Other Ambulatory Visit: Payer: Self-pay | Admitting: Surgery

## 2020-08-29 ENCOUNTER — Other Ambulatory Visit: Payer: Self-pay

## 2020-08-29 ENCOUNTER — Ambulatory Visit: Payer: BC Managed Care – PPO | Admitting: Family Medicine

## 2020-08-29 ENCOUNTER — Encounter: Payer: Self-pay | Admitting: Family Medicine

## 2020-08-29 VITALS — BP 107/75 | HR 96 | Temp 98.2°F | Ht 66.0 in | Wt 169.0 lb

## 2020-08-29 DIAGNOSIS — R21 Rash and other nonspecific skin eruption: Secondary | ICD-10-CM

## 2020-08-29 DIAGNOSIS — E559 Vitamin D deficiency, unspecified: Secondary | ICD-10-CM | POA: Diagnosis not present

## 2020-08-29 DIAGNOSIS — E1165 Type 2 diabetes mellitus with hyperglycemia: Secondary | ICD-10-CM | POA: Diagnosis not present

## 2020-08-29 DIAGNOSIS — E1065 Type 1 diabetes mellitus with hyperglycemia: Secondary | ICD-10-CM

## 2020-08-29 DIAGNOSIS — E1040 Type 1 diabetes mellitus with diabetic neuropathy, unspecified: Secondary | ICD-10-CM

## 2020-08-29 DIAGNOSIS — Z794 Long term (current) use of insulin: Secondary | ICD-10-CM

## 2020-08-29 DIAGNOSIS — IMO0002 Reserved for concepts with insufficient information to code with codable children: Secondary | ICD-10-CM

## 2020-08-29 LAB — GLUCOSE, POCT (MANUAL RESULT ENTRY): POC Glucose: 317 mg/dl — AB (ref 70–99)

## 2020-08-29 LAB — POCT GLYCOSYLATED HEMOGLOBIN (HGB A1C): Hemoglobin A1C: 14 % — AB (ref 4.0–5.6)

## 2020-08-29 MED ORDER — CLOBETASOL PROPIONATE 0.05 % EX GEL: 1.0000 "application " | Freq: Two times a day (BID) | CUTANEOUS | 3 refills | Status: DC

## 2020-08-29 MED ORDER — NYSTATIN 100000 UNIT/GM EX POWD: 1.0000 "application " | Freq: Three times a day (TID) | CUTANEOUS | 0 refills | Status: DC

## 2020-08-29 MED ORDER — VITAMIN D (ERGOCALCIFEROL) 1.25 MG (50000 UNIT) PO CAPS: 50000.0000 [IU] | ORAL_CAPSULE | ORAL | 5 refills | Status: DC

## 2020-08-29 MED ORDER — INSULIN DETEMIR 100 UNIT/ML ~~LOC~~ SOLN: 67.0000 [IU] | Freq: Every day | SUBCUTANEOUS | 3 refills | Status: DC

## 2020-08-29 NOTE — Patient Instructions (Addendum)
Fasting BG goal 100-140 Increase your levemir by 2 every 3 days if not in that range  Let me know once your glucose is at goal and how much levemir it is taking  Diabetes Mellitus Action Plan Following a diabetes action plan is a way for you to manage your diabetes (diabetes mellitus) symptoms. The plan is color-coded to help you understand what actions you need to take based on any symptoms you are having.  If you have symptoms in the red zone, you need medical care right away.  If you have symptoms in the yellow zone, you are having problems.  If you have symptoms in the green zone, you are doing well. Learning about and understanding diabetes can take time. Follow the plan that you develop with your health care provider. Know the target range for your blood sugar (glucose) level, and review your treatment plan with your health care provider at each visit. The target range for my blood sugar level is __________________________ mg/dL. Red zone Get medical help right away if you have any of the following symptoms:  A blood sugar test result that is below 54 mg/dL (3 mmol/L).  A blood sugar test result that is at or above 240 mg/dL (13.3 mmol/L) for 2 days in a row.  Confusion or trouble thinking clearly.  Difficulty breathing.  Sickness or a fever for 2 or more days that is not getting better.  Moderate or large ketone levels in your urine.  Feeling tired or having no energy. If you have any red zone symptoms, do not wait to see if the symptoms will go away. Get medical help right away. Call your local emergency services (911 in the [place] Do not drive yourself to the hospital. If you have severely low blood sugar (severe hypoglycemia) and you cannot eat or drink, you may need glucagon. Make sure a family member or close friend knows how to check your blood sugar and how to give you glucagon. You may need to be treated in a hospital for this condition.   Yellow zone If you have any  of the following symptoms, your diabetes is not under control and you may need to make some changes:  A blood sugar test result that is at or above 240 mg/dL (13.3 mmol/L) for 2 days in a row.  Blood sugar test results that are below 70 mg/dL (3.9 mmol/L).  Other symptoms of hypoglycemia, such as: ? Shaking or feeling light-headed. ? Confusion or irritability. ? Feeling hungry. ? Having a fast heartbeat. If you have any yellow zone symptoms:  Treat your hypoglycemia by eating or drinking 15 grams of a rapid-acting carbohydrate. Follow the 15:15 rule: ? Take 15 grams of a rapid-acting carbohydrate, such as:  1 tube of glucose gel.  4 glucose pills.  4 oz (120 mL) of fruit juice.  4 oz (120 mL) of regular (not diet) soda. ? Check your blood sugar 15 minutes after you take the carbohydrate. ? If the repeat blood sugar test is still at or below 70 mg/dL (3.9 mmol/L), take 15 grams of a carbohydrate again. ? If your blood sugar does not increase above 70 mg/dL (3.9 mmol/L) after 3 tries, get medical help right away. ? After your blood sugar returns to normal, eat a meal or a snack within 1 hour.  Keep taking your daily medicines as told by your health care provider.  Check your blood sugar more often than you normally would. ? Write down your results. ?  Call your health care provider if you have trouble keeping your blood sugar in your target range.   Green zone These signs mean you are doing well and you can continue what you are doing to manage your diabetes:  Your blood sugar is within your personal target range. For most people, a blood sugar level before a meal (preprandial) should be 80-130 mg/dL (4.4-7.2 mmol/L).  You feel well, and you are able to do daily activities. If you are in the green zone, continue to manage your diabetes as told by your health care provider. To do this:  Eat a healthy diet.  Exercise regularly.  Check your blood sugar as told by your health  care provider.  Take your medicines as told by your health care provider.   Where to find more information  American Diabetes Association (ADA): diabetes.org  Association of Diabetes Care & Education Specialists (ADCES): diabeteseducator.org Summary  Following a diabetes action plan is a way for you to manage your diabetes symptoms. The plan is color-coded to help you understand what actions you need to take based on any symptoms you are having.  Follow the plan that you develop with your health care provider. Make sure you know your personal target blood sugar level.  Review your treatment plan with your health care provider at each visit. This information is not intended to replace advice given to you by your health care provider. Make sure you discuss any questions you have with your health care provider. Document Revised: 02/10/2020 Document Reviewed: 02/10/2020 Elsevier Patient Education  [place].    If you have lab work done today you will be contacted with your lab results within the next 2 weeks.  If you have not heard from [place] then please contact us. The fastest way to get your results is to register for My Chart.   IF you received an x-ray today, you will receive an invoice from [facility]. Please contact [facility] at [phone] with questions or concerns regarding your invoice.   IF you received labwork today, you will receive an invoice from [facility]. Please contact [facility] at [phone] with questions or concerns regarding your invoice.   Our billing staff will not be able to assist you with questions regarding bills from these companies.  You will be contacted with the lab results as soon as they are available. The fastest way to get your results is to activate your My Chart account. Instructions are located on the last page of this paperwork. If you have not heard from [place] regarding the results in 2 weeks, please contact this office.

## 2020-08-29 NOTE — Progress Notes (Signed)
1/11/202212:11 PM  Melyssa L McAdoo-Collier 10/25/1985, 35 y.o., female [id number]  Chief Complaint  Patient presents with  . Diabetes    Follow up     HPI:   Patient is a 35 y.o. female with past medical history significant for DM who presents today for routine follow-up.  Last vitamin D Has not started vitamin D supplement Lab Results  Component Value Date   VD25OH 8.4 (L) 07/06/2020    DM Levemir: 65 units daily Novolog: Sliding scale approx 10-15 units 3 times per day BG: 317 current FBG: 180-200 Denies episodes of hypoglycemia Highest BG in the 300s Has noticed slight improvement from last visit Has not heard back from Endocrinology referral  Lab Results  Component Value Date   HGBA1C 14.0 (A) 08/29/2020   HGBA1C 14.0 (A) 07/06/2020   HGBA1C 11.9 (A) 10/27/2019   Lab Results  Component Value Date   MICROALBUR 0.1 11/09/2012   LDLCALC 120 (H) 10/27/2019   CREATININE 0.66 07/06/2020      Depression screen PHQ 2/9 08/29/2020 07/06/2020 11/24/2019  Decreased Interest 0 0 0  Down, Depressed, Hopeless 0 0 0  PHQ - 2 Score 0 0 0  Altered sleeping - - -  Tired, decreased energy - - -  Change in appetite - - -  Feeling bad or failure about yourself  - - -  Trouble concentrating - - -  Moving slowly or fidgety/restless - - -  Suicidal thoughts - - -  PHQ-9 Score - - -    Fall Risk  08/29/2020 07/06/2020 11/24/2019 10/27/2019  Falls in the past year? 0 0 0 0  Number falls in past yr: 0 0 0 0  Injury with Fall? 0 0 0 0  Follow up Falls evaluation completed Falls evaluation completed Falls evaluation completed Falls evaluation completed     Allergies  Allergen Reactions  . Pramipexole Shortness Of Breath    Prior to Admission medications   Medication Sig Start Date End Date Taking? Authorizing Provider  BELBUCA 150 MCG FILM Take 150 mg by mouth in the morning and at bedtime. 12/29/19  Yes [provider]  Continuous Blood Gluc Receiver  (FREESTYLE LIBRE 14 DAY READER) DEVI Apply 1 Device topically continuous. E10.65 10/27/19  Yes Stallings, Zoe A, MD  Continuous Blood Gluc Sensor (FREESTYLE LIBRE 14 DAY SENSOR) MISC Apply 1 Device topically every 14 (fourteen) days. E10.65 10/27/19  Yes Stallings, Zoe A, MD  GRALISE 600 MG TABS Take 600 mg by mouth in the morning, at noon, and at bedtime. 03/30/20  Yes [provider]  hydrOXYzine (ATARAX/VISTARIL) 25 MG tablet Take 1 tablet (25 mg total) by mouth every 6 (six) hours as needed for anxiety. 04/23/20  Yes Tacy Learn, PA-C  insulin aspart (NOVOLOG FLEXPEN) 100 UNIT/ML FlexPen Inject 10 Units into the skin 3 (three) times daily with meals. plus sliding scale 07/06/20  Yes Danesha Kirchoff, Laurita Quint, FNP  insulin detemir (LEVEMIR) 100 UNIT/ML injection Inject 0.65 mLs (65 Units total) into the skin at bedtime. 07/06/20  Yes Raeden Belzer, Laurita Quint, FNP  Insulin Pen Needle (B-D UF III MINI PEN NEEDLES) 31G X 5 MM MISC To use with Insulin pens, as directed 07/06/20  Yes Ashtyn Meland, Laurita Quint, FNP  Insulin Pen Needle 31G X 5 MM MISC To use with insulin 07/06/20  Yes Lesleyanne Politte, Laurita Quint, FNP  Insulin Syringe-Needle U-100 (INSULIN SYRINGE 1CC/31GX5/16") 31G X 5/16" 1 ML MISC To use with insulin 07/06/20  Yes Markeshia Giebel, Laurita Quint, FNP  lidocaine (XYLOCAINE) 5 % ointment Apply 1 application topically at bedtime. To feet 03/11/19  Yes Melvenia Beam, MD  rosuvastatin (CRESTOR) 10 MG tablet Take 1 tablet (10 mg total) by mouth daily. Patient not taking: Reported on 08/29/2020 07/06/20   Leomia Blake, Laurita Quint, FNP    Past Medical History:  Diagnosis Date  . 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 Diabetes mellitus    Type 1  . History of chicken pox   . Neuropathy 07/2012    Past Surgical History:  Procedure Laterality Date  . CESAREAN SECTION     only one C/S per pt.  Marland Kitchen DILATION AND CURETTAGE OF UTERUS      Social History   Tobacco Use  . Smoking status: Never Smoker  . Smokeless tobacco: Never Used   Substance Use Topics  . Alcohol use: No    Family History  Problem Relation Age of Onset  . Hypertension Mother   . Neuropathy Neg Hx     Review of Systems  Constitutional: Negative for chills, fever and malaise/fatigue.  Eyes: Negative for blurred vision and double vision.  Respiratory: Negative for cough, shortness of breath and wheezing.   Cardiovascular: Negative for chest pain, palpitations and leg swelling.  Gastrointestinal: Negative for abdominal pain, blood in stool, constipation, diarrhea, heartburn, nausea and vomiting.  Genitourinary: Negative for dysuria, frequency and hematuria.  Musculoskeletal: Negative for back pain and joint pain.  Skin: Positive for rash (groin).  Neurological: Negative for dizziness, weakness and headaches.  Endo/Heme/Allergies: Negative for polydipsia.     OBJECTIVE:  Today's Vitals   08/29/20 1054  BP: 107/75  Pulse: 96  Temp: 98.2 F (36.8 C)  SpO2: 99%  Weight: 169 lb (76.7 kg)  Height: 5\' 6"  (1.676 m)   Body mass index is 27.28 kg/m.   Physical Exam Constitutional:      General: She is not in acute distress.    Appearance: Normal appearance. She is not ill-appearing.  HENT:     Head: Normocephalic.  Cardiovascular:     Rate and Rhythm: Normal rate and regular rhythm.     Pulses: Normal pulses.     Heart sounds: Normal heart sounds. No murmur heard. No friction rub. No gallop.   Pulmonary:     Effort: Pulmonary effort is normal. No respiratory distress.     Breath sounds: Normal breath sounds. No stridor. No wheezing, rhonchi or rales.  Abdominal:     General: Bowel sounds are normal.     Palpations: Abdomen is soft.     Tenderness: There is no abdominal tenderness.  Musculoskeletal:     Right lower leg: No edema.     Left lower leg: No edema.  Skin:    General: Skin is warm and dry.     Findings: Erythema (groin) present.  Neurological:     Mental Status: She is alert and oriented to person, place, and time.   Psychiatric:        Mood and Affect: Mood normal.        Behavior: Behavior normal.     Results for orders placed or performed in visit on 08/29/20 (from the past 24 hour(s))  POCT glucose (manual entry)     Status: Abnormal   Collection Time: 08/29/20 11:09 AM  Result Value Ref Range   POC Glucose 317 (A) 70 - 99 mg/dl  POCT glycosylated hemoglobin (Hb A1C)     Status: Abnormal   Collection Time: 08/29/20 11:14 AM  Result  Value Ref Range   Hemoglobin A1C 14.0 (A) 4.0 - 5.6 %   HbA1c POC (<> result, manual entry)     HbA1c, POC (prediabetic range)     HbA1c, POC (controlled diabetic range)      No results found.   ASSESSMENT and PLAN  Problem List Items Addressed This Visit      Endocrine   Uncontrolled type 1 diabetes mellitus with diabetic neuropathy, with long-term current use of insulin (HCC) - Primary   Relevant Medications   insulin detemir (LEVEMIR) 100 UNIT/ML injection   Other Relevant Orders   POCT glycosylated hemoglobin (Hb A1C) (Completed)   POCT glucose (manual entry) (Completed)   Ambulatory referral to Endocrinology    Other Visit Diagnoses    Vitamin D deficiency       Relevant Medications   Vitamin D, Ergocalciferol, (DRISDOL) 1.25 MG (50000 UNIT) CAPS capsule   Other Relevant Orders   Vitamin D, 25-hydroxy   Type 2 diabetes mellitus with hyperglycemia, with long-term current use of insulin (HCC)       Relevant Medications   insulin detemir (LEVEMIR) 100 UNIT/ML injection   Rash and nonspecific skin eruption       Relevant Medications   clobetasol (TEMOVATE) 0.05 % GEL   nystatin (MYCOSTATIN/NYSTOP) powder     Plan   Fasting BG goal 100-140  Increase your levemir by 2 every 3 days if not in that range   Let me know once your glucose is at goal and how much levemir it is taking  Discussed hypo/hyperglycemia episodes  Hoping to transition to insulin pump with endocrinology  Vitamin D daily  Steroid cream and nystatin for yeast  infection  Educated on BG control to decrease yeast infections   Return for Next scheduled appointment.    Huston Foley Corrigan Kretschmer, FNP-BC Primary Care at [place], [place] [address] Ph.  [phone] Fax [phone]

## 2020-08-30 LAB — VITAMIN D 25 HYDROXY (VIT D DEFICIENCY, FRACTURES): Vit D, 25-Hydroxy: 9.1 ng/mL — ABNORMAL LOW (ref 30.0–100.0)

## 2020-09-06 ENCOUNTER — Encounter: Payer: Self-pay | Admitting: Family Medicine

## 2020-10-09 ENCOUNTER — Ambulatory Visit: Payer: BC Managed Care – PPO | Admitting: Family Medicine

## 2020-11-01 ENCOUNTER — Ambulatory Visit: Payer: BC Managed Care – PPO | Admitting: Family Medicine

## 2020-11-01 ENCOUNTER — Encounter: Payer: Self-pay | Admitting: Family Medicine

## 2020-11-01 ENCOUNTER — Other Ambulatory Visit: Payer: Self-pay

## 2020-11-01 VITALS — BP 117/81 | HR 116 | Temp 98.7°F | Ht 66.0 in | Wt 162.0 lb

## 2020-11-01 DIAGNOSIS — R350 Frequency of micturition: Secondary | ICD-10-CM

## 2020-11-01 DIAGNOSIS — E1065 Type 1 diabetes mellitus with hyperglycemia: Secondary | ICD-10-CM

## 2020-11-01 DIAGNOSIS — Z794 Long term (current) use of insulin: Secondary | ICD-10-CM

## 2020-11-01 DIAGNOSIS — IMO0002 Reserved for concepts with insufficient information to code with codable children: Secondary | ICD-10-CM

## 2020-11-01 DIAGNOSIS — E1165 Type 2 diabetes mellitus with hyperglycemia: Secondary | ICD-10-CM | POA: Diagnosis not present

## 2020-11-01 DIAGNOSIS — E1040 Type 1 diabetes mellitus with diabetic neuropathy, unspecified: Secondary | ICD-10-CM | POA: Diagnosis not present

## 2020-11-01 LAB — POCT URINALYSIS DIP (MANUAL ENTRY)
Bilirubin, UA: NEGATIVE
Glucose, UA: >=1000 mg/dL — AB
Ketones, POC UA: NEGATIVE mg/dL
Leukocytes, UA: NEGATIVE
Nitrite, UA: NEGATIVE
Protein Ur, POC: NEGATIVE mg/dL
Spec Grav, UA: <=1.005 — AB (ref 1.010–1.025)
Urobilinogen, UA: 0.2 E.U./dL
pH, UA: 6 (ref 5.0–8.0)

## 2020-11-01 LAB — POCT WET + KOH PREP
Trich by wet prep: ABSENT
Yeast by KOH: ABSENT
Yeast by wet prep: ABSENT

## 2020-11-01 LAB — POCT GLYCOSYLATED HEMOGLOBIN (HGB A1C): HbA1c POC (<> result, manual entry): 12.3 % (ref 4.0–5.6)

## 2020-11-01 LAB — GLUCOSE, POCT (MANUAL RESULT ENTRY)

## 2020-11-01 MED ORDER — NOVOLOG FLEXPEN 100 UNIT/ML ~~LOC~~ SOPN: 15.0000 [IU] | PEN_INJECTOR | Freq: Three times a day (TID) | SUBCUTANEOUS | 3 refills | Status: DC

## 2020-11-01 MED ORDER — METRONIDAZOLE 1.3 % VA GEL: 1.0000 "application " | Freq: Every evening | VAGINAL | 0 refills | Status: AC

## 2020-11-01 MED ORDER — INSULIN DETEMIR 100 UNIT/ML ~~LOC~~ SOLN: 80.0000 [IU] | Freq: Every day | SUBCUTANEOUS | 3 refills | Status: DC

## 2020-11-01 MED ORDER — FLUCONAZOLE 150 MG PO TABS: 150.0000 mg | ORAL_TABLET | Freq: Once | ORAL | 0 refills | Status: AC

## 2020-11-01 NOTE — Patient Instructions (Addendum)
Diabetes Mellitus and Standards of [place] with and managing diabetes (diabetes mellitus) can be complicated. Your diabetes treatment may be managed by a team of health care providers, including:  A physician who specializes in diabetes (endocrinologist). You might also have visits with a nurse practitioner or physician assistant.  Nurses.  A registered dietitian.  A certified diabetes care and education specialist.  An exercise specialist.  A pharmacist.  An eye doctor.  A foot specialist (podiatrist).  A dental care provider.  A primary care provider.  A mental health care provider. How to manage your diabetes You can do many things to successfully manage your diabetes. Your health care providers will follow guidelines to help you get the best quality of care. Here are general guidelines for your diabetes management plan. Your health care providers may give you more specific instructions. Physical exams When you are diagnosed with diabetes, and each year after that, your health care provider will ask about your medical and family history. You will have a physical exam, which may include:  Measuring your height, weight, and body mass index (BMI).  Checking your blood pressure. This will be done at every routine medical visit. Your target blood pressure may vary depending on your medical conditions, your age, and other factors.  A thyroid exam.  A skin exam.  Screening for nerve damage (peripheral neuropathy). This may include checking the pulse in your legs and feet and the level of sensation in your hands and feet.  A foot exam to inspect the structure and skin of your feet, including checking for cuts, bruises, redness, blisters, sores, or other problems.  Screening for blood vessel (vascular) problems. This may include checking the pulse in your legs and feet and checking your temperature. Blood tests Depending on your treatment plan and your personal  needs, you may have the following tests:  Hemoglobin A1C (HbA1C). This test provides information about blood sugar (glucose) control over the previous 2-3 months. It is used to adjust your treatment plan, if needed. This test will be done: ? At least 2 times a year, if you are meeting your treatment goals. ? 4 times a year, if you are not meeting your treatment goals or if your goals have changed.  Lipid testing, including total cholesterol, LDL and HDL cholesterol, and triglyceride levels. ? The goal for LDL is less than 100 mg/dL (5.5 mmol/L). If you are at high risk for complications, the goal is less than 70 mg/dL (3.9 mmol/L). ? The goal for HDL is 40 mg/dL (2.2 mmol/L) or higher for men, and 50 mg/dL (2.8 mmol/L) or higher for women. An HDL cholesterol of 60 mg/dL (3.3 mmol/L) or higher gives some protection against heart disease. ? The goal for triglycerides is less than 150 mg/dL (8.3 mmol/L).  Liver function tests.  Kidney function tests.  Thyroid function tests.   Dental and eye exams  Visit your dentist two times a year.  If you have type 1 diabetes, your health care provider may recommend an eye exam within 5 years after you are diagnosed, and then once a year after your first exam. ? For children with type 1 diabetes, the health care provider may recommend an eye exam when your child is age 57 or older and has had diabetes for 3-5 years. After the first exam, your child should get an eye exam once a year.  If you have type 2 diabetes, your health care provider may recommend an eye exam  as soon as you are diagnosed, and then every 1-2 years after your first exam.   Immunizations  A yearly flu (influenza) vaccine is recommended annually for everyone 6 months or older. This is especially important if you have diabetes.  The pneumonia (pneumococcal) vaccine is recommended for everyone 2 years or older who has diabetes. If you are age 70 or older, you may get the pneumonia vaccine  as a series of two separate shots.  The hepatitis B vaccine is recommended for adults shortly after being diagnosed with diabetes. Adults and children with diabetes should receive all other vaccines according to age-specific recommendations from the Centers for Disease Control and Prevention (CDC). Mental and emotional health Screening for symptoms of eating disorders, anxiety, and depression is recommended at the time of diagnosis and after as needed. If your screening shows that you have symptoms, you may need more evaluation. You may work with a mental health care provider. Follow these instructions at home: Treatment plan You will monitor your blood glucose levels and may give yourself insulin. Your treatment plan will be reviewed at every medical visit. You and your health care provider will discuss:  How you are taking your medicines, including insulin.  Any side effects you have.  Your blood glucose level target goals.  How often you monitor your blood glucose level.  Lifestyle habits, such as activity level and tobacco, alcohol, and substance use. Education Your health care provider will assess how well you are monitoring your blood glucose levels and whether you are taking your insulin and medicines correctly. He or she may refer you to:  A certified diabetes care and education specialist to manage your diabetes throughout your life, starting at diagnosis.  A registered dietitian who can create and review your personal nutrition plan.  An exercise specialist who can discuss your activity level and exercise plan. General instructions  Take over-the-counter and prescription medicines only as told by your health care provider.  Keep all follow-up visits. This is important. Where to find support There are many diabetes support networks, including:  American Diabetes Association (ADA): diabetes.org  Defeat Diabetes Foundation: defeatdiabetes.org Where to find more  information  American Diabetes Association (ADA): www.diabetes.org  Association of Diabetes Care & Education Specialists (ADCES): diabeteseducator.org  International Diabetes Federation (IDF): https://www.munoz-bell.org/ Summary  Managing diabetes (diabetes mellitus) can be complicated. Your diabetes treatment may be managed by a team of health care providers.  Your health care providers follow guidelines to help you get the best quality care.  You should have physical exams, blood tests, blood pressure monitoring, immunizations, and screening tests regularly. Stay updated on how to manage your diabetes.  Your health care providers may also give you more specific instructions based on your individual health. This information is not intended to replace advice given to you by your health care provider. Make sure you discuss any questions you have with your health care provider. Document Revised: 02/10/2020 Document Reviewed: 02/10/2020 Elsevier Patient Education  [place].    If you have lab work done today you will be contacted with your lab results within the next 2 weeks.  If you have not heard from [place] then please contact us. The fastest way to get your results is to register for My Chart.   IF you received an x-ray today, you will receive an invoice from [facility]. Please contact [facility] at [phone] with questions or concerns regarding your invoice.   IF you received labwork today, you will  receive an invoice from [facility]. Please contact [facility] at [phone] with questions or concerns regarding your invoice.   Our billing staff will not be able to assist you with questions regarding bills from these companies.  You will be contacted with the lab results as soon as they are available. The fastest way to get your results is to activate your My Chart account. Instructions are located on the last page of this paperwork. If you have not heard from [place] regarding the  results in 2 weeks, please contact this office.

## 2020-11-01 NOTE — Progress Notes (Signed)
3/16/20222:17 PM  [name] 09-26-1985, 35 y.o., female [id number]  Chief Complaint  Patient presents with  . Diabetes  . Urinary Frequency    Lower back and side pain, bad odor     HPI:   Patient is a 35 y.o. female with past medical history significant for DM who presents today for possible UTI.   Blood glucose today is > 500 in clinic  DM Levemir: 80 units daily Novolog: Sliding scale approx 15-20 units 3 times per day BG: >500 current FBG today 250 Denies episodes of hypoglycemia Lowest 180s Has noticed slight improvement from last visit Has not heard back from Endocrinology referral Hoping to transition to insulin pump with endocrinology   Lab Results  Component Value Date   HGBA1C 12.3 11/01/2020   HGBA1C 14.0 (A) 08/29/2020   HGBA1C 14.0 (A) 07/06/2020   Lab Results  Component Value Date   MICROALBUR 0.1 11/09/2012   LDLCALC 120 (H) 10/27/2019   CREATININE 0.66 07/06/2020   Last vitamin D ON weekly vitamin D supplement Lab Results  Component Value Date   VD25OH 9.1 (L) 08/29/2020      Depression screen PHQ 2/9 11/01/2020 08/29/2020 07/06/2020  Decreased Interest 0 0 0  Down, Depressed, Hopeless 0 0 0  PHQ - 2 Score 0 0 0  Altered sleeping - - -  Tired, decreased energy - - -  Change in appetite - - -  Feeling bad or failure about yourself  - - -  Trouble concentrating - - -  Moving slowly or fidgety/restless - - -  Suicidal thoughts - - -  PHQ-9 Score - - -    Fall Risk  08/29/2020 07/06/2020 11/24/2019 10/27/2019  Falls in the past year? 0 0 0 0  Number falls in past yr: 0 0 0 0  Injury with Fall? 0 0 0 0  Follow up Falls evaluation completed Falls evaluation completed Falls evaluation completed Falls evaluation completed     Allergies  Allergen Reactions  . Pramipexole Shortness Of Breath    Prior to Admission medications   Medication Sig Start Date End Date Taking? Authorizing Provider  BELBUCA 150 MCG FILM Take  150 mg by mouth in the morning and at bedtime. 12/29/19  Yes [provider]  Continuous Blood Gluc Receiver (FREESTYLE LIBRE 14 DAY READER) DEVI Apply 1 Device topically continuous. E10.65 10/27/19  Yes Stallings, Zoe A, MD  Continuous Blood Gluc Sensor (FREESTYLE LIBRE 14 DAY SENSOR) MISC Apply 1 Device topically every 14 (fourteen) days. E10.65 10/27/19  Yes Stallings, Zoe A, MD  GRALISE 600 MG TABS Take 600 mg by mouth in the morning, at noon, and at bedtime. 03/30/20  Yes [provider]  hydrOXYzine (ATARAX/VISTARIL) 25 MG tablet Take 1 tablet (25 mg total) by mouth every 6 (six) hours as needed for anxiety. 04/23/20  Yes Tacy Learn, PA-C  insulin aspart (NOVOLOG FLEXPEN) 100 UNIT/ML FlexPen Inject 10 Units into the skin 3 (three) times daily with meals. plus sliding scale 07/06/20  Yes Greco Gastelum, Laurita Quint, FNP  insulin detemir (LEVEMIR) 100 UNIT/ML injection Inject 0.65 mLs (65 Units total) into the skin at bedtime. 07/06/20  Yes Deseri Loss, Laurita Quint, FNP  Insulin Pen Needle (B-D UF III MINI PEN NEEDLES) 31G X 5 MM MISC To use with Insulin pens, as directed 07/06/20  Yes Maize Brittingham, Laurita Quint, FNP  Insulin Pen Needle 31G X 5 MM MISC To use with insulin 07/06/20  Yes Tatanisha Cuthbert, Laurita Quint, FNP  Insulin Syringe-Needle U-100 (INSULIN SYRINGE 1CC/31GX5/16") 31G X 5/16" 1 ML MISC To use with insulin 07/06/20  Yes Myriah Boggus, Laurita Quint, FNP  lidocaine (XYLOCAINE) 5 % ointment Apply 1 application topically at bedtime. To feet 03/11/19  Yes Melvenia Beam, MD  rosuvastatin (CRESTOR) 10 MG tablet Take 1 tablet (10 mg total) by mouth daily. Patient not taking: Reported on 08/29/2020 07/06/20   Leroy Pettway, Laurita Quint, FNP    Past Medical History:  Diagnosis Date  . Chronic painful diabetic neuropathy ([place])   . Diabetes mellitus    Type 1  . History of chicken pox   . Neuropathy 07/2012    Past Surgical History:  Procedure Laterality Date  . CESAREAN SECTION     only one C/S per pt.  Marland Kitchen DILATION AND CURETTAGE OF  UTERUS      Social History   Tobacco Use  . Smoking status: Never Smoker  . Smokeless tobacco: Never Used  Substance Use Topics  . Alcohol use: No    Family History  Problem Relation Age of Onset  . Hypertension Mother   . Neuropathy Neg Hx     Review of Systems  Constitutional: Positive for malaise/fatigue. Negative for chills and fever.  Eyes: Negative for blurred vision and double vision.  Respiratory: Negative for cough, shortness of breath and wheezing.   Cardiovascular: Negative for chest pain, palpitations and leg swelling.  Gastrointestinal: Negative for abdominal pain, blood in stool, constipation, diarrhea, heartburn, nausea and vomiting.  Genitourinary: Positive for flank pain and frequency. Negative for dysuria, hematuria and urgency.       Malodorous urine  Musculoskeletal: Positive for back pain. Negative for joint pain.  Skin: Negative for rash.  Neurological: Negative for dizziness, weakness and headaches.  Endo/Heme/Allergies: Positive for polydipsia.     OBJECTIVE:  Today's Vitals   11/01/20 1358  BP: 117/81  Pulse: (!) 116  Temp: 98.7 F (37.1 C)  SpO2: 97%  Weight: 162 lb (73.5 kg)  Height: 5\' 6"  (1.676 m)   Body mass index is 26.15 kg/m.   Physical Exam Constitutional:      General: She is not in acute distress.    Appearance: Normal appearance. She is not ill-appearing.  HENT:     Head: Normocephalic.  Cardiovascular:     Rate and Rhythm: Normal rate and regular rhythm.     Pulses: Normal pulses.     Heart sounds: Normal heart sounds. No murmur heard. No friction rub. No gallop.   Pulmonary:     Effort: Pulmonary effort is normal. No respiratory distress.     Breath sounds: Normal breath sounds. No stridor. No wheezing, rhonchi or rales.  Abdominal:     General: Bowel sounds are normal.     Palpations: Abdomen is soft.     Tenderness: There is no abdominal tenderness.  Musculoskeletal:     Right lower leg: No edema.     Left  lower leg: No edema.  Skin:    General: Skin is warm and dry.  Neurological:     General: No focal deficit present.     Mental Status: She is alert and oriented to person, place, and time. Mental status is at baseline.     Sensory: No sensory deficit.     Motor: No weakness.     Coordination: Coordination normal.  Psychiatric:        Mood and Affect: Mood normal.        Behavior: Behavior normal.  Results for orders placed or performed in visit on 11/01/20 (from the past 24 hour(s))  POCT Wet + KOH Prep     Status: Abnormal   Collection Time: 11/01/20  2:03 PM  Result Value Ref Range   Yeast by KOH Absent Absent   Yeast by wet prep Absent Absent   WBC by wet prep None (A) Few   Clue Cells Wet Prep HPF POC None None   Trich by wet prep Absent Absent   Bacteria Wet Prep HPF POC Few Few   Epithelial Cells By [employer] ([facility]) Few None, Few, Too numerous to count   RBC,UR,HPF,POC None None RBC/hpf  POCT glucose (manual entry)     Status: Abnormal   Collection Time: 11/01/20  2:03 PM  Result Value Ref Range   POC Glucose HHH 70 - 99 mg/dl  POCT glycosylated hemoglobin (Hb A1C)     Status: Abnormal   Collection Time: 11/01/20  2:04 PM  Result Value Ref Range   Hemoglobin A1C     HbA1c POC (<> result, manual entry) 12.3 4.0 - 5.6 %   HbA1c, POC (prediabetic range)     HbA1c, POC (controlled diabetic range)    POCT urinalysis dipstick     Status: Abnormal   Collection Time: 11/01/20  2:05 PM  Result Value Ref Range   Color, UA yellow yellow   Clarity, UA clear clear   Glucose, UA >=1,000 (A) negative mg/dL   Bilirubin, UA negative negative   Ketones, POC UA negative negative mg/dL   Spec Grav, UA <=1.005 (A) 1.010 - 1.025   Blood, UA trace-intact (A) negative   pH, UA 6.0 5.0 - 8.0   Protein Ur, POC negative negative mg/dL   Urobilinogen, UA 0.2 0.2 or 1.0 E.U./dL   Nitrite, UA Negative Negative   Leukocytes, UA Negative Negative    No results found.   ASSESSMENT  and PLAN  Problem List Items Addressed This Visit      Endocrine   Uncontrolled type 1 diabetes mellitus with diabetic neuropathy, with long-term current use of insulin (HCC) - Primary   Relevant Medications   insulin aspart (NOVOLOG FLEXPEN) 100 UNIT/ML FlexPen   insulin detemir (LEVEMIR) 100 UNIT/ML injection   Other Relevant Orders   POCT glycosylated hemoglobin (Hb A1C) (Completed)   POCT glucose (manual entry) (Completed)   Ambulatory referral to Endocrinology    Other Visit Diagnoses    Urinary frequency       Relevant Medications   fluconazole (DIFLUCAN) 150 MG tablet   metroNIDAZOLE 1.3 % GEL   Other Relevant Orders   POCT Wet + KOH Prep (Completed)   POCT urinalysis dipstick (Completed)   Type 2 diabetes mellitus with hyperglycemia, with long-term current use of insulin (HCC)       Relevant Medications   insulin aspart (NOVOLOG FLEXPEN) 100 UNIT/ML FlexPen   insulin detemir (LEVEMIR) 100 UNIT/ML injection     Plan   Fasting BG goal 110-140  Increase your levemir by 2 every 3 days if not in that range  Let me know once your glucose is at goal and how much levemir it is taking  Discussed hypo/hyperglycemia episodes  Endocrine referral placed  Increased sliding scale from 10-15 to 15-20  Concerned that compliance is an issue given elevated insulin with minimal improvement  A1C is improved but glucose in clinic is > 500  RTC/ED precautions discussed  Return in about 1 month (around 12/02/2020).    Huston Foley Lela Murfin, FNP-BC [facility] at [address], [place] [address] Ph.  [phone] Fax [phone]

## 2020-12-13 ENCOUNTER — Encounter (HOSPITAL_BASED_OUTPATIENT_CLINIC_OR_DEPARTMENT_OTHER): Payer: Self-pay | Admitting: *Deleted

## 2020-12-13 ENCOUNTER — Emergency Department (HOSPITAL_BASED_OUTPATIENT_CLINIC_OR_DEPARTMENT_OTHER)
Admission: EM | Admit: 2020-12-13 | Discharge: 2020-12-13 | Disposition: A | Discharge status: Home / Self Care | Payer: BC Managed Care – PPO | Attending: Emergency Medicine | Admitting: Emergency Medicine

## 2020-12-13 ENCOUNTER — Other Ambulatory Visit: Payer: Self-pay

## 2020-12-13 DIAGNOSIS — R739 Hyperglycemia, unspecified: Secondary | ICD-10-CM | POA: Diagnosis present

## 2020-12-13 DIAGNOSIS — E1065 Type 1 diabetes mellitus with hyperglycemia: Secondary | ICD-10-CM | POA: Insufficient documentation

## 2020-12-13 DIAGNOSIS — N760 Acute vaginitis: Secondary | ICD-10-CM | POA: Insufficient documentation

## 2020-12-13 DIAGNOSIS — E1042 Type 1 diabetes mellitus with diabetic polyneuropathy: Secondary | ICD-10-CM | POA: Diagnosis not present

## 2020-12-13 DIAGNOSIS — B9689 Other specified bacterial agents as the cause of diseases classified elsewhere: Secondary | ICD-10-CM | POA: Insufficient documentation

## 2020-12-13 DIAGNOSIS — Z794 Long term (current) use of insulin: Secondary | ICD-10-CM | POA: Insufficient documentation

## 2020-12-13 LAB — CBC
HCT: 37.8 % (ref 36.0–46.0)
Hemoglobin: 12.8 g/dL (ref 12.0–15.0)
MCH: 28.8 pg (ref 26.0–34.0)
MCHC: 33.9 g/dL (ref 30.0–36.0)
MCV: 85.1 fL (ref 80.0–100.0)
Platelets: 356 10*3/uL (ref 150–400)
RBC: 4.44 MIL/uL (ref 3.87–5.11)
RDW: 13.2 % (ref 11.5–15.5)
WBC: 6.5 10*3/uL (ref 4.0–10.5)
nRBC: 0 % (ref 0.0–0.2)

## 2020-12-13 LAB — URINALYSIS, ROUTINE W REFLEX MICROSCOPIC
Bilirubin Urine: NEGATIVE
Glucose, UA: >=500 mg/dL — AB
Ketones, ur: NEGATIVE mg/dL
Leukocytes,Ua: NEGATIVE
Nitrite: NEGATIVE
Protein, ur: NEGATIVE mg/dL
Specific Gravity, Urine: 1.01 (ref 1.005–1.030)
pH: 7 (ref 5.0–8.0)

## 2020-12-13 LAB — I-STAT VENOUS BLOOD GAS, ED
Acid-Base Excess: 7 mmol/L — ABNORMAL HIGH (ref 0.0–2.0)
Bicarbonate: 32.8 mmol/L — ABNORMAL HIGH (ref 20.0–28.0)
Calcium, Ion: 1.22 mmol/L (ref 1.15–1.40)
HCT: 38 % (ref 36.0–46.0)
Hemoglobin: 12.9 g/dL (ref 12.0–15.0)
O2 Saturation: 62 %
Patient temperature: 99
Potassium: 4.6 mmol/L (ref 3.5–5.1)
Sodium: 135 mmol/L (ref 135–145)
TCO2: 34 mmol/L — ABNORMAL HIGH (ref 22–32)
pCO2, Ven: 50 mmHg (ref 44.0–60.0)
pH, Ven: 7.426 (ref 7.250–7.430)
pO2, Ven: 33 mmHg (ref 32.0–45.0)

## 2020-12-13 LAB — URINALYSIS, MICROSCOPIC (REFLEX)

## 2020-12-13 LAB — BASIC METABOLIC PANEL
Anion gap: 12 (ref 5–15)
BUN: 11 mg/dL (ref 6–20)
CO2: 26 mmol/L (ref 22–32)
Calcium: 9.6 mg/dL (ref 8.9–10.3)
Chloride: 93 mmol/L — ABNORMAL LOW (ref 98–111)
Creatinine, Ser: 0.75 mg/dL (ref 0.44–1.00)
GFR, Estimated: >60 mL/min (ref >60)
Glucose, Bld: 398 mg/dL — ABNORMAL HIGH (ref 70–99)
Potassium: 4.1 mmol/L (ref 3.5–5.1)
Sodium: 131 mmol/L — ABNORMAL LOW (ref 135–145)

## 2020-12-13 LAB — CBG MONITORING, ED
Glucose-Capillary: 430 mg/dL — ABNORMAL HIGH (ref 70–99)
Glucose-Capillary: 357 mg/dL — ABNORMAL HIGH (ref 70–99)
Glucose-Capillary: 285 mg/dL — ABNORMAL HIGH (ref 70–99)
Glucose-Capillary: 140 mg/dL — ABNORMAL HIGH (ref 70–99)

## 2020-12-13 LAB — WET PREP, GENITAL
Sperm: NONE SEEN
Trich, Wet Prep: NONE SEEN
Yeast Wet Prep HPF POC: NONE SEEN

## 2020-12-13 LAB — PREGNANCY, URINE: Preg Test, Ur: NEGATIVE

## 2020-12-13 MED ORDER — SODIUM CHLORIDE 0.9 % IV BOLUS
1000.0000 mL | Freq: Once | INTRAVENOUS | Status: AC
  Administered 2020-12-13: 1000 mL via INTRAVENOUS

## 2020-12-13 MED ORDER — INSULIN ASPART 100 UNIT/ML ~~LOC~~ SOLN
10.0000 [IU] | Freq: Once | SUBCUTANEOUS | Status: AC
  Administered 2020-12-13: 10 [IU] via SUBCUTANEOUS

## 2020-12-13 MED ORDER — INSULIN REGULAR HUMAN 100 UNIT/ML IJ SOLN: 10.0000 [IU] | Freq: Once | INTRAMUSCULAR | Status: DC

## 2020-12-13 MED ORDER — METRONIDAZOLE 500 MG PO TABS: 500.0000 mg | ORAL_TABLET | Freq: Two times a day (BID) | ORAL | 0 refills | Status: AC

## 2020-12-13 MED ORDER — ONDANSETRON HCL 4 MG/2ML IJ SOLN
4.0000 mg | Freq: Once | INTRAMUSCULAR | Status: AC
  Administered 2020-12-13: 4 mg via INTRAVENOUS
  Filled 2020-12-13: qty 2

## 2020-12-13 NOTE — ED Triage Notes (Signed)
C/o increased BS x 5 days, 300 CBG at home

## 2020-12-13 NOTE — ED Provider Notes (Addendum)
[facility] Provider Note   CSN: [id number] Arrival date & time: 12/13/20  1541     History Chief Complaint  Patient presents with  . Hyperglycemia    Caitlyn L McAdoo-Collier is a 35 y.o. female with type 1 diabetes who presents with concern for "feeling poorly" for the last 5 days.  She does endorse some nausea and vomiting over the weekend, chills at home, at this time feels very fatigued, groggy, nauseous, and with a foul-smelling vaginal discharge.  Patient is vaccinated against  COVID-19.  She has been admitted to the hospital in the past for diabetic ketoacidosis and is concerned that she may be heading that direction at this time.  In regards to her vaginal symptoms, patient is in a monogamous relationship with her husband.  Denies any new vaginal bleeding or pelvic pain.  Does endorse some urinary frequency and urgency as well as foul-smelling vaginal odor.  She denies any chest pain or shortness of breath.  I personally viewed the patient medical records.  History of uncontrolled type 1 diabetes despite reported compliance with her medication.  Additionally he is on medication for hypercholesterolemia.  Has diabetic polyneuropathy.  HPI     Past Medical History:  Diagnosis Date  . Chronic painful diabetic neuropathy ([place])   . Diabetes mellitus    Type 1  . History of chicken pox   . Neuropathy 07/2012    Patient Active Problem List   Diagnosis Date Noted  . Diabetic polyneuropathy associated with type 1 diabetes mellitus ([place]) 03/14/2019  . Non compliance with medical treatment 03/14/2019  . DKA (diabetic ketoacidoses) 07/21/2018  . DKA, type 1 ([place]) 07/21/2018  . Diabetic peripheral neuropathy associated with type 1 diabetes mellitus ([place]) 11/09/2012  . Uncontrolled type 1 diabetes mellitus with diabetic neuropathy, with long-term current use of insulin ([place]) 07/03/2011    Class: Acute    Past Surgical History:  Procedure  Laterality Date  . CESAREAN SECTION     only one C/S per pt.  Marland Kitchen DILATION AND CURETTAGE OF UTERUS       OB History    Gravida  4   Para  2   Term  2   Preterm      AB  2   Living  2     SAB      IAB  2   Ectopic      Multiple      Live Births              Family History  Problem Relation Age of Onset  . Hypertension Mother   . Neuropathy Neg Hx     Social History   Tobacco Use  . Smoking status: Never Smoker  . Smokeless tobacco: Never Used  Vaping Use  . Vaping Use: Never used  Substance Use Topics  . Alcohol use: No  . Drug use: Yes    Types: Marijuana    Home Medications Prior to Admission medications   Medication Sig Start Date End Date Taking? Authorizing Provider  metroNIDAZOLE (FLAGYL) 500 MG tablet Take 1 tablet (500 mg total) by mouth 2 (two) times daily for 7 days. 12/13/20 12/20/20 Yes Jamiyah Dingley R, PA-C  BELBUCA 150 MCG FILM Take 150 mg by mouth in the morning and at bedtime. 12/29/19   [provider]  clobetasol (TEMOVATE) 0.05 % GEL Apply 1 application topically 2 (two) times daily. 08/29/20   Just, Laurita Quint, FNP  Continuous Blood Gluc  Receiver (FREESTYLE LIBRE 14 DAY READER) DEVI Apply 1 Device topically continuous. E10.65 10/27/19   Forrest Moron, MD  Continuous Blood Gluc Sensor (FREESTYLE LIBRE 14 DAY SENSOR) MISC Apply 1 Device topically every 14 (fourteen) days. E10.65 10/27/19   Forrest Moron, MD  GRALISE 600 MG TABS Take 600 mg by mouth in the morning, at noon, and at bedtime. 03/30/20   [provider]  hydrOXYzine (ATARAX/VISTARIL) 25 MG tablet Take 1 tablet (25 mg total) by mouth every 6 (six) hours as needed for anxiety. 04/23/20   Tacy Learn, PA-C  insulin aspart (NOVOLOG FLEXPEN) 100 UNIT/ML FlexPen Inject 15-20 Units into the skin 3 (three) times daily with meals. plus sliding scale 11/01/20   Just, Laurita Quint, FNP  insulin detemir (LEVEMIR) 100 UNIT/ML injection Inject 0.8 mLs (80 Units total) into  the skin at bedtime. Fasting BG goal 100-140 Increase your levemir by 2 every 3 days if not in that range 11/01/20   Just, Laurita Quint, FNP  Insulin Pen Needle (B-D UF III MINI PEN NEEDLES) 31G X 5 MM MISC To use with Insulin pens, as directed 07/06/20   Just, Laurita Quint, FNP  Insulin Pen Needle 31G X 5 MM MISC To use with insulin 07/06/20   Just, Laurita Quint, FNP  Insulin Syringe-Needle U-100 (INSULIN SYRINGE 1CC/31GX5/16") 31G X 5/16" 1 ML MISC To use with insulin 07/06/20   Just, Laurita Quint, FNP  lidocaine (XYLOCAINE) 5 % ointment Apply 1 application topically at bedtime. To feet 03/11/19   Melvenia Beam, MD  nystatin (MYCOSTATIN/NYSTOP) powder Apply 1 application topically 3 (three) times daily. 08/29/20   Just, Laurita Quint, FNP  rosuvastatin (CRESTOR) 10 MG tablet Take 1 tablet (10 mg total) by mouth daily. 07/06/20   Just, Laurita Quint, FNP  Vitamin D, Ergocalciferol, (DRISDOL) 1.25 MG (50000 UNIT) CAPS capsule Take 1 capsule (50,000 Units total) by mouth every 7 (seven) days. 08/29/20   Just, Laurita Quint, FNP    Allergies    Pramipexole  Review of Systems   Review of Systems  Constitutional: Positive for activity change, appetite change, chills and fatigue. Negative for diaphoresis and fever.  HENT: Negative.   Respiratory: Negative.   Cardiovascular: Negative.   Gastrointestinal: Positive for abdominal pain, nausea and vomiting. Negative for constipation and diarrhea.  Genitourinary: Positive for frequency, urgency and vaginal discharge. Negative for decreased urine volume, difficulty urinating, dyspareunia, dysuria, enuresis, flank pain, genital sores, hematuria, menstrual problem, pelvic pain, vaginal bleeding and vaginal pain.  Musculoskeletal:       Diabetic neuropathy  Skin: Negative.   Allergic/Immunologic: Positive for immunocompromised state. Negative for food allergies.       DMType 1  Neurological: Negative.  Negative for dizziness, tremors, seizures, syncope, facial asymmetry, speech  difficulty, light-headedness, numbness and headaches.    Physical Exam Updated Vital Signs BP 117/87   Pulse 90   Temp 98.5 F (36.9 C) (Oral)   Resp 18   Ht 5\' 6"  (1.676 m)   Wt 72.6 kg   LMP 11/12/2020   SpO2 99%   BMI 25.82 kg/m   Physical Exam Vitals and nursing note reviewed. Exam conducted with a chaperone present.  Constitutional:      Appearance: She is ill-appearing. She is not toxic-appearing.  HENT:     Head: Normocephalic and atraumatic.     Nose: Nose normal.     Mouth/Throat:     Mouth: Mucous membranes are moist.     Pharynx: Oropharynx is  clear. Uvula midline. No oropharyngeal exudate, posterior oropharyngeal erythema or uvula swelling.     Tonsils: No tonsillar exudate.  Eyes:     General: Lids are normal. Vision grossly intact.        Right eye: No discharge.        Left eye: No discharge.     Extraocular Movements: Extraocular movements intact.     Conjunctiva/sclera: Conjunctivae normal.     Pupils: Pupils are equal, round, and reactive to light.  Neck:     Trachea: Trachea and phonation normal.  Cardiovascular:     Rate and Rhythm: Normal rate and regular rhythm.     Pulses: Normal pulses.     Heart sounds: Normal heart sounds. No murmur heard.   Pulmonary:     Effort: Pulmonary effort is normal. No tachypnea, bradypnea, accessory muscle usage, prolonged expiration or respiratory distress.     Breath sounds: Normal breath sounds. No wheezing or rales.  Chest:     Chest wall: No mass, lacerations, deformity, swelling, tenderness or crepitus.  Abdominal:     General: Bowel sounds are normal. There is no distension.     Palpations: Abdomen is soft.     Tenderness: There is abdominal tenderness in the suprapubic area. There is no right CVA tenderness, left CVA tenderness, guarding or rebound.  Genitourinary:    General: Normal vulva.     Exam position: Lithotomy position.     Vagina: Vaginal discharge present. No erythema, tenderness, bleeding  or lesions.     Cervix: No cervical motion tenderness, discharge, friability, lesion or erythema.     Uterus: Normal.      Adnexa: Right adnexa normal and left adnexa normal.     Musculoskeletal:        General: No deformity.     Cervical back: Normal range of motion and neck supple. No crepitus. No pain with movement, spinous process tenderness or muscular tenderness.     Right lower leg: No edema.     Left lower leg: No edema.  Lymphadenopathy:     Cervical: No cervical adenopathy.  Skin:    General: Skin is warm and dry.     Findings: No abscess or wound.  Neurological:     Mental Status: She is alert. Mental status is at baseline.     Motor: Motor function is intact.     Comments: Diabetic neuropathy in bilateral feet  Psychiatric:        Mood and Affect: Mood normal.     ED Results / Procedures / Treatments   Labs (all labs ordered are listed, but only abnormal results are displayed) Labs Reviewed  WET PREP, GENITAL - Abnormal; Notable for the following components:      Result Value   Clue Cells Wet Prep HPF POC PRESENT (*)    WBC, Wet Prep HPF POC MANY (*)    All other components within normal limits  BASIC METABOLIC PANEL - Abnormal; Notable for the following components:   Sodium 131 (*)    Chloride 93 (*)    Glucose, Bld 398 (*)    All other components within normal limits  URINALYSIS, ROUTINE W REFLEX MICROSCOPIC - Abnormal; Notable for the following components:   Glucose, UA >=500 (*)    Hgb urine dipstick TRACE (*)    All other components within normal limits  URINALYSIS, MICROSCOPIC (REFLEX) - Abnormal; Notable for the following components:   Bacteria, UA FEW (*)    All other components within  normal limits  CBG MONITORING, ED - Abnormal; Notable for the following components:   Glucose-Capillary 430 (*)    All other components within normal limits  CBG MONITORING, ED - Abnormal; Notable for the following components:   Glucose-Capillary 357 (*)    All  other components within normal limits  I-STAT VENOUS BLOOD GAS, ED - Abnormal; Notable for the following components:   Bicarbonate 32.8 (*)    TCO2 34 (*)    Acid-Base Excess 7.0 (*)    All other components within normal limits  CBG MONITORING, ED - Abnormal; Notable for the following components:   Glucose-Capillary 285 (*)    All other components within normal limits  CBG MONITORING, ED - Abnormal; Notable for the following components:   Glucose-Capillary 140 (*)    All other components within normal limits  URINE CULTURE  CBC  PREGNANCY, URINE  BETA-HYDROXYBUTYRIC ACID  BLOOD GAS, VENOUS  GC/CHLAMYDIA PROBE AMP ([place]) NOT AT [facility]    EKG EKG Interpretation  Date/Time:  Wednesday December 13 2020 19:00:04 EDT Ventricular Rate:  98 PR Interval:  147 QRS Duration: 78 QT Interval:  352 QTC Calculation: 450 R Axis:   78 Text Interpretation: Sinus rhythm Confirmed by Fredia Sorrow [phone]) on 12/13/2020 7:05:29 PM   Radiology No results found.  Procedures .Critical Care Performed by: Emeline Darling, PA-C Authorized by: Emeline Darling, PA-C   Critical care provider statement:    Critical care time (minutes):  45   Critical care was necessary to treat or prevent imminent or life-threatening deterioration of the following conditions:  Metabolic crisis   Critical care was time spent personally by me on the following activities:  Discussions with consultants, evaluation of patient's response to treatment, examination of patient, ordering and performing treatments and interventions, ordering and review of laboratory studies, ordering and review of radiographic studies, pulse oximetry, re-evaluation of patient's condition, obtaining history from patient or surrogate and review of old charts     Medications Ordered in ED Medications  sodium chloride 0.9 % bolus 1,000 mL (0 mLs Intravenous Stopped 12/13/20 1857)  ondansetron (ZOFRAN) injection 4 mg (4 mg  Intravenous Given 12/13/20 1755)  insulin aspart (novoLOG) injection 10 Units (10 Units Subcutaneous Given 12/13/20 1808)  sodium chloride 0.9 % bolus 1,000 mL (0 mLs Intravenous Stopped 12/13/20 2200)    ED Course  I have reviewed the triage vital signs and the nursing notes.  Pertinent labs & imaging results that were available during my care of the patient were reviewed by me and considered in my medical decision making (see chart for details).    MDM Rules/Calculators/A&P                         89 old female with history of type 1 diabetes who presents with concern for malaise, nausea, vomiting, concern for possible DKA.  Differential diagnosis for this patient symptoms includes but is not limited to HHS, DKA, Symptomatic hyperglycemia, dehydration, ACS, pregnancy, sepsis, acute gastroenteritis, COVID-19, UTI.  Basic laboratory studies obtained in triage.  CBC unremarkable, CMP with hyperglycemia of 430, UA with glucose and trace hemoglobin, few bacteria.  Will add on beta hydroxybutyric acid and i-STAT VBG.  We will proceed with wet prep and GC/chlamydia probe as well.  Patient receiving IV fluids and 10 units of insulin for hyperglycemia.  Nausea medication administered.  VBG without acidosis, beta intersphincteric acid pending.  Wet prep positive for clue cells.  Improvement in blood sugar to 300s, will administer another fluid bolus.  Improvement in hyperglycemia to 140 following IV fluid resuscitation and insulin.  No further work-up warranted in the ED at this time.  Patient and I discussed role of presumptive therapy for gonorrhea and chlamydia; she voiced understanding of her treatment options and expressed wish to wait for test results in her MyChart app prior to proceeding with antibiotic therapy for GC/chlamydia.  We will proceed with metronidazole treatment for bacterial vaginosis.  Prescription placed.  Recommend close follow-up with patient's PCP for adjustment of her insulin.   Hyperglycemia improved significantly, patient may be discharged home  [place] voiced understanding of her medical evaluation and treatment plan.  Each of her questions was answered to her expressed satisfaction.  Return precautions were given.  Patient is well-appearing, stable, and appropriate for discharge at this time.  This chart was dictated using voice recognition software, Dragon. Despite the best efforts of this provider to proofread and correct errors, errors may still occur which can change documentation meaning.  Final Clinical Impression(s) / ED Diagnoses Final diagnoses:  Hyperglycemia  BV (bacterial vaginosis)    Rx / DC Orders ED Discharge Orders         Ordered    metroNIDAZOLE (FLAGYL) 500 MG tablet  2 times daily        12/13/20 2211           Cleatis Fandrich, Gypsy Balsam, PA-C 12/14/20 0021    Fredia Sorrow, MD 12/14/20 1518    Orean Giarratano, Gypsy Balsam, PA-C 12/29/20 1244    Fredia Sorrow, MD 12/29/20 1727

## 2020-12-13 NOTE — Discharge Instructions (Signed)
You are seen in the emergency department today for your nausea and fatigue.  You are not in diabetic ketoacidosis, however you did have significantly elevated blood sugars.  We are able to manage this with a dose of insulin and IV fluids.  Please follow-up with your primary care doctor for further management of your Insulin dosage.    Additionally you were found to have bacterial vaginosis which is a bacterial infection in your vagina but is not an STD. Prescribed antibiotic to take for 1 week to treat this infection, which should resolve the odor.  Please be aware that taking this medication while drinking alcohol can make you very sick.  May follow-up with your primary care doctor.  Return to the ER develop any worsening symptoms, nausea or vomiting does not stop, if you pass out, or develop any other severe symptoms.

## 2020-12-14 LAB — GC/CHLAMYDIA PROBE AMP (~~LOC~~) NOT AT ARMC
Chlamydia: NEGATIVE
Comment: NEGATIVE
Comment: NORMAL
Neisseria Gonorrhea: NEGATIVE

## 2020-12-14 LAB — BETA-HYDROXYBUTYRIC ACID: Beta-Hydroxybutyric Acid: 0.19 mmol/L (ref 0.05–0.27)

## 2020-12-16 LAB — URINE CULTURE: Culture: >=100000 — AB

## 2020-12-17 ENCOUNTER — Telehealth: Payer: Self-pay

## 2020-12-17 NOTE — Telephone Encounter (Signed)
Post ED Visit - Positive Culture Follow-up: Unsuccessful Patient Follow-up  Culture assessed and recommendations reviewed by:  []  Elenor Quinones, Pharm.D. []  Heide Guile, Pharm.D., BCPS AQ-ID []  Parks Neptune, Pharm.D., BCPS []  Alycia Rossetti, Pharm.D., BCPS []  [place], Pharm.D., BCPS, AAHIVP []  Legrand Como, Pharm.D., BCPS, AAHIVP []  Wynell Balloon, PharmD []  Vincenza Hews, PharmD, BCPS Apolonio Schneiders Rumbarger Pharm D Positive urine culture  [x]  Patient discharged without antimicrobial prescription and treatment is now indicated []  Organism is resistant to prescribed ED discharge antimicrobial []  Patient with positive blood cultures  Needs Macrobid 100 mg BID x 5 days Unable to contact patient after 3 attempts, letter will be sent to address on file  Genia Del 12/17/2020, 10:56 AM

## 2020-12-17 NOTE — Progress Notes (Signed)
ED Antimicrobial Stewardship Positive Culture Follow Up   [name] is an 35 y.o. female who presented to [facility] on 12/13/2020 with a chief complaint of  Chief Complaint  Patient presents with  . Hyperglycemia    Recent Results (from the past 720 hour(s))  Urine culture     Status: Abnormal   Collection Time: 12/13/20  4:19 PM   Specimen: Urine, Random  Result Value Ref Range Status   Specimen Description   Final    URINE, RANDOM Performed at [facility], [address] [place], [place] [address]    Special Requests   Final    NONE Performed at [facility], [address] [place], [place] [address]    Culture >=100,000 COLONIES/mL ESCHERICHIA COLI (A)  Final   Report Status 12/16/2020 FINAL  Final   Organism ID, Bacteria ESCHERICHIA COLI (A)  Final      Susceptibility   Escherichia coli - MIC*    AMPICILLIN 4 SENSITIVE Sensitive     CEFAZOLIN <=4 SENSITIVE Sensitive     CEFEPIME <=0.12 SENSITIVE Sensitive     CEFTRIAXONE <=0.25 SENSITIVE Sensitive     CIPROFLOXACIN <=0.25 SENSITIVE Sensitive     GENTAMICIN <=1 SENSITIVE Sensitive     IMIPENEM <=0.25 SENSITIVE Sensitive     NITROFURANTOIN <=16 SENSITIVE Sensitive     TRIMETH/SULFA <=20 SENSITIVE Sensitive     AMPICILLIN/SULBACTAM <=2 SENSITIVE Sensitive     PIP/TAZO <=4 SENSITIVE Sensitive     * >=100,000 COLONIES/mL ESCHERICHIA COLI  Wet prep, genital     Status: Abnormal   Collection Time: 12/13/20  7:35 PM   Specimen: Cervix  Result Value Ref Range Status   Yeast Wet Prep HPF POC NONE SEEN NONE SEEN Final   Trich, Wet Prep NONE SEEN NONE SEEN Final   Clue Cells Wet Prep HPF POC PRESENT (A) NONE SEEN Final   WBC, Wet Prep HPF POC MANY (A) NONE SEEN Final   Sperm NONE SEEN  Final    Comment: Performed at [facility], [address] [place], [place] [address]    []  Treated with N/A, organism resistant to prescribed antimicrobial [x]  Patient discharged  originally without antimicrobial agent (except flagyl for BV treatment) and UTI treatment is now indicated  New antibiotic prescription: macrobid 100mg  PO BID x 5 days  ED Provider: Margarita Mail, PA-C  [name], [name] 12/17/2020, 10:23 AM Clinical Pharmacist Phone -  [phone]

## 2020-12-24 ENCOUNTER — Telehealth (HOSPITAL_BASED_OUTPATIENT_CLINIC_OR_DEPARTMENT_OTHER): Payer: Self-pay | Admitting: Emergency Medicine

## 2020-12-24 NOTE — Telephone Encounter (Signed)
Post ED Visit - Positive Culture Follow-up: Successful Patient Follow-Up  Culture assessed and recommendations reviewed by:  []  Elenor Quinones, Pharm.D. []  Heide Guile, Pharm.D., BCPS AQ-ID []  Parks Neptune, Pharm.D., BCPS []  Alycia Rossetti, Pharm.D., BCPS []  [place], Pharm.D., BCPS, AAHIVP []  Legrand Como, Pharm.D., BCPS, AAHIVP [x]  Salome Arnt, PharmD, BCPS []  Johnnette Gourd, PharmD, BCPS []  Hughes Better, PharmD, BCPS []  Leeroy Cha, PharmD  Positive urine culture  [x]  Patient discharged without antimicrobial prescription and treatment is now indicated []  Organism is resistant to prescribed ED discharge antimicrobial []  Patient with positive blood cultures  Changes discussed with ED provider: Margarita Mail PA New antibiotic prescription: Macrobid 100 mg BID x five days Called to [facility] [phone]  Contacted patient, date 12/24/20, time [place] 12/24/2020, 1:42 PM

## 2021-02-15 ENCOUNTER — Observation Stay (HOSPITAL_COMMUNITY): Payer: BC Managed Care – PPO

## 2021-02-15 ENCOUNTER — Other Ambulatory Visit: Payer: Self-pay

## 2021-02-15 ENCOUNTER — Encounter (HOSPITAL_COMMUNITY): Payer: Self-pay

## 2021-02-15 ENCOUNTER — Inpatient Hospital Stay (HOSPITAL_COMMUNITY)
Admission: EM | Admit: 2021-02-15 | Discharge: 2021-02-17 | DRG: 638 | Disposition: A | Discharge status: Home / Self Care | Payer: BC Managed Care – PPO | Attending: Internal Medicine | Admitting: Internal Medicine

## 2021-02-15 DIAGNOSIS — Z888 Allergy status to other drugs, medicaments and biological substances status: Secondary | ICD-10-CM

## 2021-02-15 DIAGNOSIS — R109 Unspecified abdominal pain: Secondary | ICD-10-CM

## 2021-02-15 DIAGNOSIS — K2970 Gastritis, unspecified, without bleeding: Secondary | ICD-10-CM | POA: Diagnosis present

## 2021-02-15 DIAGNOSIS — R112 Nausea with vomiting, unspecified: Secondary | ICD-10-CM

## 2021-02-15 DIAGNOSIS — Y92009 Unspecified place in unspecified non-institutional (private) residence as the place of occurrence of the external cause: Secondary | ICD-10-CM

## 2021-02-15 DIAGNOSIS — K8010 Calculus of gallbladder with chronic cholecystitis without obstruction: Secondary | ICD-10-CM | POA: Diagnosis present

## 2021-02-15 DIAGNOSIS — E876 Hypokalemia: Secondary | ICD-10-CM | POA: Diagnosis present

## 2021-02-15 DIAGNOSIS — E1065 Type 1 diabetes mellitus with hyperglycemia: Secondary | ICD-10-CM | POA: Diagnosis not present

## 2021-02-15 DIAGNOSIS — Z8249 Family history of ischemic heart disease and other diseases of the circulatory system: Secondary | ICD-10-CM

## 2021-02-15 DIAGNOSIS — R739 Hyperglycemia, unspecified: Secondary | ICD-10-CM

## 2021-02-15 DIAGNOSIS — Z20822 Contact with and (suspected) exposure to covid-19: Secondary | ICD-10-CM | POA: Diagnosis present

## 2021-02-15 DIAGNOSIS — R1084 Generalized abdominal pain: Secondary | ICD-10-CM

## 2021-02-15 DIAGNOSIS — Z79899 Other long term (current) drug therapy: Secondary | ICD-10-CM

## 2021-02-15 DIAGNOSIS — K3184 Gastroparesis: Secondary | ICD-10-CM | POA: Diagnosis present

## 2021-02-15 DIAGNOSIS — K8020 Calculus of gallbladder without cholecystitis without obstruction: Secondary | ICD-10-CM | POA: Diagnosis present

## 2021-02-15 DIAGNOSIS — E1043 Type 1 diabetes mellitus with diabetic autonomic (poly)neuropathy: Secondary | ICD-10-CM | POA: Diagnosis present

## 2021-02-15 DIAGNOSIS — Z794 Long term (current) use of insulin: Secondary | ICD-10-CM

## 2021-02-15 DIAGNOSIS — E1042 Type 1 diabetes mellitus with diabetic polyneuropathy: Secondary | ICD-10-CM | POA: Diagnosis present

## 2021-02-15 DIAGNOSIS — T383X6A Underdosing of insulin and oral hypoglycemic [antidiabetic] drugs, initial encounter: Secondary | ICD-10-CM | POA: Diagnosis present

## 2021-02-15 DIAGNOSIS — F1299 Cannabis use, unspecified with unspecified cannabis-induced disorder: Secondary | ICD-10-CM | POA: Diagnosis present

## 2021-02-15 LAB — CBC
HCT: 39.4 % (ref 36.0–46.0)
Hemoglobin: 13.1 g/dL (ref 12.0–15.0)
MCH: 28.4 pg (ref 26.0–34.0)
MCHC: 33.2 g/dL (ref 30.0–36.0)
MCV: 85.3 fL (ref 80.0–100.0)
Platelets: 300 10*3/uL (ref 150–400)
RBC: 4.62 MIL/uL (ref 3.87–5.11)
RDW: 14.6 % (ref 11.5–15.5)
WBC: 7.3 10*3/uL (ref 4.0–10.5)
nRBC: 0 % (ref 0.0–0.2)

## 2021-02-15 LAB — URINALYSIS, ROUTINE W REFLEX MICROSCOPIC
Bilirubin Urine: NEGATIVE
Glucose, UA: >=500 mg/dL — AB
Ketones, ur: 80 mg/dL — AB
Leukocytes,Ua: NEGATIVE
Nitrite: NEGATIVE
Protein, ur: NEGATIVE mg/dL
Specific Gravity, Urine: 1.024 (ref 1.005–1.030)
pH: 6 (ref 5.0–8.0)

## 2021-02-15 LAB — I-STAT BETA HCG BLOOD, ED (MC, WL, AP ONLY): I-stat hCG, quantitative: <5 m[IU]/mL (ref <5)

## 2021-02-15 LAB — CBG MONITORING, ED
Glucose-Capillary: 414 mg/dL — ABNORMAL HIGH (ref 70–99)
Glucose-Capillary: 319 mg/dL — ABNORMAL HIGH (ref 70–99)
Glucose-Capillary: 284 mg/dL — ABNORMAL HIGH (ref 70–99)

## 2021-02-15 LAB — BLOOD GAS, VENOUS
Acid-base deficit: 0.2 mmol/L (ref 0.0–2.0)
Bicarbonate: 25.5 mmol/L (ref 20.0–28.0)
O2 Saturation: 42.7 %
Patient temperature: 98.6
pCO2, Ven: 48.2 mmHg (ref 44.0–60.0)
pH, Ven: 7.343 (ref 7.250–7.430)
pO2, Ven: <31 mmHg — CL (ref 32.0–45.0)

## 2021-02-15 LAB — RESP PANEL BY RT-PCR (FLU A&B, COVID) ARPGX2
Influenza A by PCR: NEGATIVE
Influenza B by PCR: NEGATIVE
SARS Coronavirus 2 by RT PCR: NEGATIVE

## 2021-02-15 LAB — BASIC METABOLIC PANEL
Anion gap: 14 (ref 5–15)
BUN: 13 mg/dL (ref 6–20)
CO2: 25 mmol/L (ref 22–32)
Calcium: 9.9 mg/dL (ref 8.9–10.3)
Chloride: 98 mmol/L (ref 98–111)
Creatinine, Ser: 0.82 mg/dL (ref 0.44–1.00)
GFR, Estimated: >60 mL/min (ref >60)
Glucose, Bld: 475 mg/dL — ABNORMAL HIGH (ref 70–99)
Potassium: 3.8 mmol/L (ref 3.5–5.1)
Sodium: 137 mmol/L (ref 135–145)

## 2021-02-15 LAB — GLUCOSE, CAPILLARY: Glucose-Capillary: 241 mg/dL — ABNORMAL HIGH (ref 70–99)

## 2021-02-15 LAB — BETA-HYDROXYBUTYRIC ACID: Beta-Hydroxybutyric Acid: 1.74 mmol/L — ABNORMAL HIGH (ref 0.05–0.27)

## 2021-02-15 LAB — LIPASE, BLOOD: Lipase: 22 U/L (ref 11–51)

## 2021-02-15 MED ORDER — INSULIN DETEMIR 100 UNIT/ML ~~LOC~~ SOLN
60.0000 [IU] | Freq: Every day | SUBCUTANEOUS | Status: DC
Start: 2021-02-15 — End: 2021-02-16
  Administered 2021-02-15: 60 [IU] via SUBCUTANEOUS
  Filled 2021-02-15: qty 0.6

## 2021-02-15 MED ORDER — METOCLOPRAMIDE HCL 5 MG/ML IJ SOLN
10.0000 mg | Freq: Three times a day (TID) | INTRAMUSCULAR | Status: DC
  Administered 2021-02-15 – 2021-02-17 (×5): 10 mg via INTRAVENOUS
  Filled 2021-02-15 (×5): qty 2

## 2021-02-15 MED ORDER — IOHEXOL 300 MG/ML  SOLN
100.0000 mL | Freq: Once | INTRAMUSCULAR | Status: AC | PRN
  Administered 2021-02-15: 100 mL via INTRAVENOUS

## 2021-02-15 MED ORDER — DEXTROSE 50 % IV SOLN: 0.0000 mL | INTRAVENOUS | Status: DC | PRN

## 2021-02-15 MED ORDER — HYOSCYAMINE SULFATE ER 0.375 MG PO TB12
0.3750 mg | ORAL_TABLET | Freq: Two times a day (BID) | ORAL | Status: DC | PRN
  Administered 2021-02-16 – 2021-02-17 (×2): 0.375 mg via ORAL
  Filled 2021-02-15 (×3): qty 1

## 2021-02-15 MED ORDER — SODIUM CHLORIDE (PF) 0.9 % IJ SOLN
INTRAMUSCULAR | Status: AC
  Filled 2021-02-15: qty 100

## 2021-02-15 MED ORDER — SODIUM CHLORIDE 0.9 % IV BOLUS
1000.0000 mL | Freq: Once | INTRAVENOUS | Status: AC
  Administered 2021-02-15: 1000 mL via INTRAVENOUS

## 2021-02-15 MED ORDER — INSULIN ASPART 100 UNIT/ML IJ SOLN
6.0000 [IU] | Freq: Once | INTRAMUSCULAR | Status: AC
  Administered 2021-02-15: 6 [IU] via SUBCUTANEOUS
  Filled 2021-02-15: qty 0.06

## 2021-02-15 MED ORDER — HYDROMORPHONE HCL 1 MG/ML IJ SOLN
1.0000 mg | Freq: Once | INTRAMUSCULAR | Status: AC
  Administered 2021-02-15: 1 mg via INTRAVENOUS
  Filled 2021-02-15: qty 1

## 2021-02-15 MED ORDER — INSULIN ASPART 100 UNIT/ML IJ SOLN
0.0000 [IU] | Freq: Four times a day (QID) | INTRAMUSCULAR | Status: DC
  Administered 2021-02-15: 5 [IU] via SUBCUTANEOUS
  Administered 2021-02-16: 3 [IU] via SUBCUTANEOUS
  Administered 2021-02-16: 2 [IU] via SUBCUTANEOUS
  Administered 2021-02-16: 3 [IU] via SUBCUTANEOUS

## 2021-02-15 MED ORDER — ONDANSETRON HCL 4 MG/2ML IJ SOLN
4.0000 mg | Freq: Four times a day (QID) | INTRAMUSCULAR | Status: DC | PRN
  Administered 2021-02-16 – 2021-02-17 (×4): 4 mg via INTRAVENOUS
  Filled 2021-02-15 (×4): qty 2

## 2021-02-15 MED ORDER — METOCLOPRAMIDE HCL 5 MG/ML IJ SOLN
10.0000 mg | Freq: Once | INTRAMUSCULAR | Status: AC
  Administered 2021-02-15: 10 mg via INTRAVENOUS
  Filled 2021-02-15: qty 2

## 2021-02-15 MED ORDER — INSULIN ASPART 100 UNIT/ML IJ SOLN
8.0000 [IU] | Freq: Once | INTRAMUSCULAR | Status: AC
  Administered 2021-02-15: 8 [IU] via SUBCUTANEOUS
  Filled 2021-02-15: qty 0.08

## 2021-02-15 MED ORDER — FENTANYL CITRATE (PF) 100 MCG/2ML IJ SOLN
100.0000 ug | Freq: Once | INTRAMUSCULAR | Status: AC
Start: 2021-02-15 — End: 2021-02-15
  Administered 2021-02-15: 100 ug via INTRAVENOUS
  Filled 2021-02-15: qty 2

## 2021-02-15 MED ORDER — SODIUM CHLORIDE 0.9 % IV SOLN
12.5000 mg | Freq: Once | INTRAVENOUS | Status: AC
  Administered 2021-02-15: 12.5 mg via INTRAVENOUS
  Filled 2021-02-15: qty 12.5

## 2021-02-15 MED ORDER — ONDANSETRON HCL 4 MG/2ML IJ SOLN
4.0000 mg | Freq: Once | INTRAMUSCULAR | Status: AC
  Administered 2021-02-15: 4 mg via INTRAVENOUS
  Filled 2021-02-15: qty 2

## 2021-02-15 MED ORDER — LACTATED RINGERS IV SOLN: INTRAVENOUS | Status: DC

## 2021-02-15 MED ORDER — FAMOTIDINE IN NACL 20-0.9 MG/50ML-% IV SOLN
20.0000 mg | Freq: Once | INTRAVENOUS | Status: AC
  Administered 2021-02-15: 20 mg via INTRAVENOUS
  Filled 2021-02-15: qty 50

## 2021-02-15 NOTE — ED Provider Notes (Signed)
[facility] DEPT Provider Note   CSN: [id number] Arrival date & time: 02/15/21  1318     History Chief Complaint  Patient presents with   Emesis   Hyperglycemia    [name] is a 35 y.o. female.  She is a type I diabetic and has a history of neuropathy.  She is on chronic narcotics.  Complaining of nausea and vomiting starting yesterday.  Socialized with generalized abdominal pain.  Feeling hot and cold.  Feeling weak.  She said she has been compliant with her medications and took her insulin this morning.  Her sugars have been up and down.  No sick contacts or recent travel.  She just started her menstrual period  The history is provided by the patient.  Emesis Severity:  Severe Duration:  2 days Timing:  Intermittent Quality:  Unable to specify Progression:  Unchanged Chronicity:  Recurrent Recent urination:  Normal Relieved by:  None tried Worsened by:  Nothing Ineffective treatments:  None tried Associated symptoms: abdominal pain, headaches and myalgias   Associated symptoms: no fever and no sore throat   Risk factors: diabetes   Hyperglycemia Associated symptoms: abdominal pain, fatigue, nausea and vomiting   Associated symptoms: no chest pain, no dysuria, no fever and no shortness of breath       Past Medical History:  Diagnosis Date   Chronic painful diabetic neuropathy ([place])    Diabetes mellitus    Type 1   History of chicken pox    Neuropathy 07/2012    Patient Active Problem List   Diagnosis Date Noted   Diabetic polyneuropathy associated with type 1 diabetes mellitus ([place]) 03/14/2019   Non compliance with medical treatment 03/14/2019   DKA (diabetic ketoacidoses) 07/21/2018   DKA, type 1 ([place]) 07/21/2018   Diabetic peripheral neuropathy associated with type 1 diabetes mellitus ([place]) 11/09/2012   Uncontrolled type 1 diabetes mellitus with diabetic neuropathy, with long-term current use of insulin ([place])  07/03/2011    Class: Acute    Past Surgical History:  Procedure Laterality Date   CESAREAN SECTION     only one C/S per pt.   DILATION AND CURETTAGE OF UTERUS       OB History     Gravida  4   Para  2   Term  2   Preterm      AB  2   Living  2      SAB      IAB  2   Ectopic      Multiple      Live Births              Family History  Problem Relation Age of Onset   Hypertension Mother    Neuropathy Neg Hx     Social History   Tobacco Use   Smoking status: Never   Smokeless tobacco: Never  Vaping Use   Vaping Use: Never used  Substance Use Topics   Alcohol use: No   Drug use: Yes    Types: Marijuana    Home Medications Prior to Admission medications   Medication Sig Start Date End Date Taking? Authorizing Provider  BELBUCA 150 MCG FILM Take 150 mg by mouth in the morning and at bedtime. 12/29/19   [provider]  clobetasol (TEMOVATE) 0.05 % GEL Apply 1 application topically 2 (two) times daily. 08/29/20   Just, Laurita Quint, FNP  Continuous Blood Gluc Receiver (FREESTYLE LIBRE 14 DAY READER) DEVI Apply  1 Device topically continuous. E10.65 10/27/19   Forrest Moron, MD  Continuous Blood Gluc Sensor (FREESTYLE LIBRE 14 DAY SENSOR) MISC Apply 1 Device topically every 14 (fourteen) days. E10.65 10/27/19   Forrest Moron, MD  GRALISE 600 MG TABS Take 600 mg by mouth in the morning, at noon, and at bedtime. 03/30/20   [provider]  hydrOXYzine (ATARAX/VISTARIL) 25 MG tablet Take 1 tablet (25 mg total) by mouth every 6 (six) hours as needed for anxiety. 04/23/20   Tacy Learn, PA-C  insulin aspart (NOVOLOG FLEXPEN) 100 UNIT/ML FlexPen Inject 15-20 Units into the skin 3 (three) times daily with meals. plus sliding scale 11/01/20   Just, Laurita Quint, FNP  insulin detemir (LEVEMIR) 100 UNIT/ML injection Inject 0.8 mLs (80 Units total) into the skin at bedtime. Fasting BG goal 100-140 Increase your levemir by 2 every 3 days if not in that  range 11/01/20   Just, Laurita Quint, FNP  Insulin Pen Needle (B-D UF III MINI PEN NEEDLES) 31G X 5 MM MISC To use with Insulin pens, as directed 07/06/20   Just, Laurita Quint, FNP  Insulin Pen Needle 31G X 5 MM MISC To use with insulin 07/06/20   Just, Laurita Quint, FNP  Insulin Syringe-Needle U-100 (INSULIN SYRINGE 1CC/31GX5/16") 31G X 5/16" 1 ML MISC To use with insulin 07/06/20   Just, Laurita Quint, FNP  lidocaine (XYLOCAINE) 5 % ointment Apply 1 application topically at bedtime. To feet 03/11/19   Melvenia Beam, MD  nystatin (MYCOSTATIN/NYSTOP) powder Apply 1 application topically 3 (three) times daily. 08/29/20   Just, Laurita Quint, FNP  rosuvastatin (CRESTOR) 10 MG tablet Take 1 tablet (10 mg total) by mouth daily. 07/06/20   Just, Laurita Quint, FNP  Vitamin D, Ergocalciferol, (DRISDOL) 1.25 MG (50000 UNIT) CAPS capsule Take 1 capsule (50,000 Units total) by mouth every 7 (seven) days. 08/29/20   Just, Laurita Quint, FNP    Allergies    Pramipexole  Review of Systems   Review of Systems  Constitutional:  Positive for fatigue. Negative for fever.  HENT:  Negative for sore throat.   Eyes:  Negative for visual disturbance.  Respiratory:  Negative for shortness of breath.   Cardiovascular:  Negative for chest pain.  Gastrointestinal:  Positive for abdominal pain, nausea and vomiting.  Genitourinary:  Negative for dysuria.  Musculoskeletal:  Positive for myalgias.  Skin:  Negative for rash.  Neurological:  Positive for light-headedness and headaches.   Physical Exam Updated Vital Signs BP (!) 143/103 (BP Location: Left Arm)   Pulse 95   Temp 98.6 F (37 C) (Oral)   Resp 16   Ht 5\' 6"  (1.676 m)   Wt 72.6 kg   LMP 02/15/2021   SpO2 100%   BMI 25.82 kg/m   Physical Exam Vitals and nursing note reviewed.  Constitutional:      General: She is not in acute distress.    Appearance: Normal appearance. She is well-developed.  HENT:     Head: Normocephalic and atraumatic.  Eyes:     Conjunctiva/sclera:  Conjunctivae normal.  Cardiovascular:     Rate and Rhythm: Normal rate and regular rhythm.     Heart sounds: No murmur heard. Pulmonary:     Effort: Pulmonary effort is normal. No respiratory distress.     Breath sounds: Normal breath sounds.  Abdominal:     Palpations: Abdomen is soft.     Tenderness: There is no abdominal tenderness. There is no guarding or  rebound.  Musculoskeletal:        General: No deformity or signs of injury. Normal range of motion.     Cervical back: Neck supple.  Skin:    General: Skin is warm and dry.  Neurological:     General: No focal deficit present.     Mental Status: She is alert.    ED Results / Procedures / Treatments   Labs (all labs ordered are listed, but only abnormal results are displayed) Labs Reviewed  BASIC METABOLIC PANEL - Abnormal; Notable for the following components:      Result Value   Glucose, Bld 475 (*)    All other components within normal limits  URINALYSIS, ROUTINE W REFLEX MICROSCOPIC - Abnormal; Notable for the following components:   Color, Urine STRAW (*)    Glucose, UA >=500 (*)    Hgb urine dipstick MODERATE (*)    Ketones, ur 80 (*)    Bacteria, UA RARE (*)    All other components within normal limits  BLOOD GAS, VENOUS - Abnormal; Notable for the following components:   pO2, Ven <31.0 (*)    All other components within normal limits  BETA-HYDROXYBUTYRIC ACID - Abnormal; Notable for the following components:   Beta-Hydroxybutyric Acid 1.74 (*)    All other components within normal limits  BASIC METABOLIC PANEL - Abnormal; Notable for the following components:   Glucose, Bld 187 (*)    All other components within normal limits  BETA-HYDROXYBUTYRIC ACID - Abnormal; Notable for the following components:   Beta-Hydroxybutyric Acid 1.02 (*)    All other components within normal limits  GLUCOSE, CAPILLARY - Abnormal; Notable for the following components:   Glucose-Capillary 241 (*)    All other components within  normal limits  GLUCOSE, CAPILLARY - Abnormal; Notable for the following components:   Glucose-Capillary 202 (*)    All other components within normal limits  GLUCOSE, CAPILLARY - Abnormal; Notable for the following components:   Glucose-Capillary 198 (*)    All other components within normal limits  GLUCOSE, CAPILLARY - Abnormal; Notable for the following components:   Glucose-Capillary 135 (*)    All other components within normal limits  GLUCOSE, CAPILLARY - Abnormal; Notable for the following components:   Glucose-Capillary 121 (*)    All other components within normal limits  GLUCOSE, CAPILLARY - Abnormal; Notable for the following components:   Glucose-Capillary 127 (*)    All other components within normal limits  CBG MONITORING, ED - Abnormal; Notable for the following components:   Glucose-Capillary 414 (*)    All other components within normal limits  CBG MONITORING, ED - Abnormal; Notable for the following components:   Glucose-Capillary 319 (*)    All other components within normal limits  CBG MONITORING, ED - Abnormal; Notable for the following components:   Glucose-Capillary 284 (*)    All other components within normal limits  RESP PANEL BY RT-PCR (FLU A&B, COVID) ARPGX2  CBC  LIPASE, BLOOD  HIV ANTIBODY (ROUTINE TESTING W REFLEX)  HEPATIC FUNCTION PANEL  HEMOGLOBIN A1C  I-STAT BETA HCG BLOOD, ED ([facility], [facility], [facility] ONLY)    EKG None  Radiology No results found.  Procedures Procedures   Medications Ordered in ED Medications  insulin detemir (LEVEMIR) injection 60 Units (60 Units Subcutaneous Given 02/15/21 2243)  lactated ringers infusion ( Intravenous New Bag/Given 02/16/21 0543)  dextrose 50 % solution 0-50 mL (has no administration in time range)  insulin aspart (novoLOG) injection 0-15 Units (2 Units  Subcutaneous Given 02/16/21 1227)  hyoscyamine (LEVBID) 0.375 MG 12 hr tablet 0.375 mg (has no administration in time range)  ondansetron (ZOFRAN) injection 4 mg (4  mg Intravenous Given 02/16/21 1143)  metoCLOPramide (REGLAN) injection 10 mg (10 mg Intravenous Given 02/16/21 1227)  pantoprazole (PROTONIX) EC tablet 40 mg (40 mg Oral Given 02/16/21 1227)  acetaminophen (TYLENOL) tablet 650 mg (has no administration in time range)  traMADol (ULTRAM) tablet 50 mg (50 mg Oral Given 02/16/21 1227)  sodium chloride 0.9 % bolus 1,000 mL (0 mLs Intravenous Stopped 02/15/21 1631)  metoCLOPramide (REGLAN) injection 10 mg (10 mg Intravenous Given 02/15/21 1427)  fentaNYL (SUBLIMAZE) injection 100 mcg (100 mcg Intravenous Given 02/15/21 1427)  insulin aspart (novoLOG) injection 8 Units (8 Units Subcutaneous Given 02/15/21 1517)  ondansetron (ZOFRAN) injection 4 mg (4 mg Intravenous Given 02/15/21 1532)  sodium chloride 0.9 % bolus 1,000 mL (0 mLs Intravenous Stopped 02/15/21 2039)  famotidine (PEPCID) IVPB 20 mg premix (0 mg Intravenous Stopped 02/15/21 1609)  HYDROmorphone (DILAUDID) injection 1 mg (1 mg Intravenous Given 02/15/21 2027)  promethazine (PHENERGAN) 12.5 mg in sodium chloride 0.9 % 50 mL IVPB (0 mg Intravenous Stopped 02/15/21 2109)  insulin aspart (novoLOG) injection 6 Units (6 Units Subcutaneous Given 02/15/21 2009)  sodium chloride (PF) 0.9 % injection (  Given by Other 02/15/21 2030)  iohexol (OMNIPAQUE) 300 MG/ML solution 100 mL (100 mLs Intravenous Contrast Given 02/15/21 2039)  HYDROmorphone (DILAUDID) injection 1 mg (1 mg Intravenous Given 02/16/21 0342)    ED Course  I have reviewed the triage vital signs and the nursing notes.  Pertinent labs & imaging results that were available during my care of the patient were reviewed by me and considered in my medical decision making (see chart for details).  Clinical Course as of 02/16/21 1358  Thu Feb 15, 2021  1526 Labs are, mixed bag normal white count normal pH normal gap although elevated beta hydroxybutyrate.  Sugars elevated.  She had fluids and nausea medication.  Still feeling nauseous.  Have ordered some  Pepcid, insulin and more fluids. [MB]  2353 Patient continues to feel nauseous.  Urine came back with ketones of 80.  She would like to go home.  I recommended admission and she is agreeable. [MB]  1911 Discussed with [facility] hospitalist Dr. Tonie Griffith.  He is asking that I order another fingerstick blood sugar and he will come down to evaluate the patient for possible admission. [MB]    Clinical Course User Index [MB] Hayden Rasmussen, MD   MDM Rules/Calculators/A&P                         This patient complains of elevated blood sugars nausea vomiting abdominal pain; this involves an extensive number of treatment Options and is a complaint that carries with it a high risk of complications and Morbidity. The differential includes DKA, hyperglycemia, gastroparesis, gastroenteritis, peptic ulcer disease I ordered, reviewed and interpreted labs, which included CBC with normal white count normal hemoglobin, chemistries with elevated blood sugar but normal gap, COVID testing negative, VBG without acidosis, urinalysis 80 ketones, beta hydroxybutyrate elevated, pregnancy test negative I ordered medication IV fluids nausea and pain medication, subcu insulin Additional history obtained from patient's family member Previous records obtained and reviewed in epic, fairly frequent ED visits and sometimes admissions for DKA and hyperglycemia I consulted [facility] hospitalist Dr.Chotiner And discussed lab and imaging findings  Critical Interventions: None  After the interventions stated above,  I reevaluated the patient and found patient's blood sugar to be improving although still remains nauseous.  I recommended that she be admitted to the hospital and she is in agreement.   Final Clinical Impression(s) / ED Diagnoses Final diagnoses:  Hyperglycemia  Nausea and vomiting, intractability of vomiting not specified, unspecified vomiting type  Generalized abdominal pain    Rx / DC Orders ED Discharge Orders      None        Hayden Rasmussen, MD 02/16/21 1404

## 2021-02-15 NOTE — Plan of Care (Signed)

## 2021-02-15 NOTE — ED Notes (Signed)
ED TO INPATIENT HANDOFF REPORT  Name/Age/Gender [name] 35 y.o. female  Code Status Code Status History    Date Active Date Inactive Code Status Order ID Comments User Context   07/21/2018 0108 07/23/2018 1321 Full Code [id number]  Vianne Bulls, MD ED   07/03/2011 0833 07/05/2011 1955 Full Code [id number]  Clydia Llano, RN ED    Questions for Most Recent Historical Code Status (Order [id number])       Home/SNF/Other Home  Chief Complaint Uncontrolled diabetes mellitus with hyperglycemia ([place]) [E11.65]  Level of Care/Admitting Diagnosis ED Disposition    ED Disposition  Admit   Condition  --   Comment  Hospital Area: [facility] [[id number]]  Level of Care: Progressive [102]  Admit to Progressive based on following criteria: GI, ENDOCRINE disease patients with GI bleeding, acute liver failure or pancreatitis, stable with diabetic ketoacidosis or thyrotoxicosis (hypothyroid) state.  May place patient in observation at [facility] or [facility] if equivalent level of care is available:: Yes  Covid Evaluation: Asymptomatic Screening Protocol (No Symptoms)  Diagnosis: Uncontrolled diabetes mellitus with hyperglycemia [facility]) [[id number]]  Admitting Physician: Eben Burow [[id number]]  Attending Physician: Eben Burow [[id number]]         Medical History Past Medical History:  Diagnosis Date  . Chronic painful diabetic neuropathy ([place])   . Diabetes mellitus    Type 1  . History of chicken pox   . Neuropathy 07/2012    Allergies Allergies  Allergen Reactions  . Pramipexole Shortness Of Breath    IV Location/Drains/Wounds Patient Lines/Drains/Airways Status    Active Line/Drains/Airways    Name Placement date Placement time Site Days   Peripheral IV 12/13/20 Left Antecubital 12/13/20  1620  Antecubital  64   Peripheral IV 02/15/21 20 G Anterior;Right;Upper Arm 02/15/21  1700  Arm  less than 1           Labs/Imaging Results for orders placed or performed during the hospital encounter of 02/15/21 (from the past 48 hour(s))  CBG monitoring, ED     Status: Abnormal   Collection Time: 02/15/21  1:44 PM  Result Value Ref Range   Glucose-Capillary 414 (H) 70 - 99 mg/dL    Comment: Glucose reference range applies only to samples taken after fasting for at least 8 hours.   Comment 1 Notify RN   Basic metabolic panel     Status: Abnormal   Collection Time: 02/15/21  2:02 PM  Result Value Ref Range   Sodium 137 135 - 145 mmol/L   Potassium 3.8 3.5 - 5.1 mmol/L   Chloride 98 98 - 111 mmol/L   CO2 25 22 - 32 mmol/L   Glucose, Bld 475 (H) 70 - 99 mg/dL    Comment: Glucose reference range applies only to samples taken after fasting for at least 8 hours.   BUN 13 6 - 20 mg/dL   Creatinine, Ser 0.82 0.44 - 1.00 mg/dL   Calcium 9.9 8.9 - 10.3 mg/dL   GFR, Estimated >60 >60 mL/min    Comment: (NOTE) Calculated using the CKD-EPI Creatinine Equation (2021)    Anion gap 14 5 - 15    Comment: Performed at [facility], [place] [address], [place], [place] [address]  CBC     Status: None   Collection Time: 02/15/21  2:02 PM  Result Value Ref Range   WBC 7.3 4.0 - 10.5 K/uL   RBC 4.62 3.87 - 5.11 MIL/uL  Hemoglobin 13.1 12.0 - 15.0 g/dL   HCT 39.4 36.0 - 46.0 %   MCV 85.3 80.0 - 100.0 fL   MCH 28.4 26.0 - 34.0 pg   MCHC 33.2 30.0 - 36.0 g/dL   RDW 14.6 11.5 - 15.5 %   Platelets 300 150 - 400 K/uL   nRBC 0.0 0.0 - 0.2 %    Comment: Performed at [facility], [place] [address] [place], [place] [address]  Blood gas, venous     Status: Abnormal   Collection Time: 02/15/21  2:02 PM  Result Value Ref Range   pH, Ven 7.343 7.250 - 7.430   pCO2, Ven 48.2 44.0 - 60.0 mmHg   pO2, Ven <31.0 (LL) 32.0 - 45.0 mmHg   Bicarbonate 25.5 20.0 - 28.0 mmol/L   Acid-base deficit 0.2 0.0 - 2.0 mmol/L   O2 Saturation 42.7 %   Patient temperature 98.6     Comment: Performed  at [facility], [place] [address], [place], [place] [address]  Beta-hydroxybutyric acid     Status: Abnormal   Collection Time: 02/15/21  2:02 PM  Result Value Ref Range   Beta-Hydroxybutyric Acid 1.74 (H) 0.05 - 0.27 mmol/L    Comment: Performed at [facility], [place] [address] [place], [place] [address]  I-Stat beta hCG blood, ED     Status: None   Collection Time: 02/15/21  2:09 PM  Result Value Ref Range   I-stat hCG, quantitative <5.0 <5 mIU/mL   Comment 3            Comment:   GEST. AGE      CONC.  (mIU/mL)   <=1 WEEK        5 - 50     2 WEEKS       50 - 500     3 WEEKS       100 - 10,000     4 WEEKS     1,000 - 30,000        FEMALE AND NON-PREGNANT FEMALE:     LESS THAN 5 mIU/mL   Urinalysis, Routine w reflex microscopic Urine, Clean Catch     Status: Abnormal   Collection Time: 02/15/21  5:04 PM  Result Value Ref Range   Color, Urine STRAW (A) YELLOW   APPearance CLEAR CLEAR   Specific Gravity, Urine 1.024 1.005 - 1.030   pH 6.0 5.0 - 8.0   Glucose, UA >=500 (A) NEGATIVE mg/dL   Hgb urine dipstick MODERATE (A) NEGATIVE   Bilirubin Urine NEGATIVE NEGATIVE   Ketones, ur 80 (A) NEGATIVE mg/dL   Protein, ur NEGATIVE NEGATIVE mg/dL   Nitrite NEGATIVE NEGATIVE   Leukocytes,Ua NEGATIVE NEGATIVE   RBC / HPF 6-10 0 - 5 RBC/hpf   WBC, UA 0-5 0 - 5 WBC/hpf   Bacteria, UA RARE (A) NONE SEEN   Squamous Epithelial / LPF 0-5 0 - 5    Comment: Performed at [facility], [place] [address], [place], [place] [address]  CBG monitoring, ED     Status: Abnormal   Collection Time: 02/15/21  6:17 PM  Result Value Ref Range   Glucose-Capillary 319 (H) 70 - 99 mg/dL    Comment: Glucose reference range applies only to samples taken after fasting for at least 8 hours.  CBG monitoring, ED     Status: Abnormal   Collection Time: 02/15/21  7:23 PM  Result Value Ref Range   Glucose-Capillary 284 (H) 70 -  99 mg/dL    Comment: Glucose reference  range applies only to samples taken after fasting for at least 8 hours.  Lipase, blood     Status: None   Collection Time: 02/15/21  8:06 PM  Result Value Ref Range   Lipase 22 11 - 51 U/L    Comment: Performed at [facility], [place] [address] [place], [place] [address]   CT ABDOMEN PELVIS W CONTRAST  Result Date: 02/15/2021 CLINICAL DATA:  Abdominal pain and vomiting. EXAM: CT ABDOMEN AND PELVIS WITH CONTRAST TECHNIQUE: Multidetector CT imaging of the abdomen and pelvis was performed using the standard protocol following bolus administration of intravenous contrast. CONTRAST:  156mL OMNIPAQUE IOHEXOL 300 MG/ML  SOLN COMPARISON:  September 17, 2003 FINDINGS: Lower chest: No acute abnormality. Hepatobiliary: No focal liver abnormality is seen. Subcentimeter gallstones are seen within the gallbladder lumen without evidence of gallbladder wall thickening or biliary dilatation. Pancreas: Unremarkable. No pancreatic ductal dilatation or surrounding inflammatory changes. Spleen: Normal in size without focal abnormality. Adrenals/Urinary Tract: Adrenal glands are unremarkable. Kidneys are normal, without renal calculi, focal lesion, or hydronephrosis. Bladder is unremarkable. Stomach/Bowel: Stomach is within normal limits. Appendix appears normal. No evidence of bowel wall thickening, distention, or inflammatory changes. Vascular/Lymphatic: No significant vascular findings are present. No enlarged abdominal or pelvic lymph nodes. Reproductive: A 2.1 cm x 2.0 cm heterogeneous uterine fibroid is suspected on the right. The bilateral adnexa are unremarkable. Other: No abdominal wall hernia or abnormality. No abdominopelvic ascites. Musculoskeletal: No acute or significant osseous findings. IMPRESSION: 1. Cholelithiasis. 2. Suspected uterine fibroid. Correlation with nonemergent pelvic ultrasound is recommended. Electronically Signed   By: Virgina Norfolk M.D.   On: 02/15/2021 20:58    Pending  Labs Unresulted Labs (From admission, onward)    Start     Ordered   02/15/21 1958  Resp Panel by RT-PCR (Flu A&B, Covid) Nasopharyngeal Swab  (Tier 2 - Symptomatic/asymptomatic with Precautions )  Once,   STAT       Question Answer Comment  Is this test for diagnosis or screening Screening   Symptomatic for COVID-19 as defined by CDC No   Hospitalized for COVID-19 No   Admitted to ICU for COVID-19 No   Previously tested for COVID-19 No   Resident in a congregate (group) care setting No   Employed in healthcare setting No   Pregnant No   Has patient completed COVID vaccination(s) (2 doses of Pfizer/Moderna 1 dose of [employer]) Unknown      02/15/21 1957   02/15/21 1950  Hemoglobin A1c  ONCE - STAT,   STAT        02/15/21 1950   Signed and Held  HIV Antibody (routine testing w rflx)  (HIV Antibody (Routine testing w reflex) panel)  Once,   R        Signed and Held   Signed and Held  Basic metabolic panel  (Hyperglycemia (not DKA or HHS))  Daily,   STAT      Signed and Held   Signed and Held  Beta-hydroxybutyric acid  (Hyperglycemia (not DKA or HHS))  Tomorrow morning,   R        Signed and Held   Signed and Held  Hemoglobin A1c  Once,   R       Comments: To assess prior glycemic control    Signed and Held          Vitals/Pain Today's Vitals   02/15/21 1837 02/15/21 2013  02/15/21 2015 02/15/21 2108  BP: (!) 131/99  131/87   Pulse: 71  76   Resp: 18  16   Temp:      TempSrc:      SpO2: 100%  100%   Weight:      Height:      PainSc:  10-Worst pain ever  10-Worst pain ever    Isolation Precautions Airborne and Contact precautions  Medications Medications  sodium chloride 0.9 % bolus 1,000 mL (0 mLs Intravenous Stopped 02/15/21 1631)  metoCLOPramide (REGLAN) injection 10 mg (10 mg Intravenous Given 02/15/21 1427)  fentaNYL (SUBLIMAZE) injection 100 mcg (100 mcg Intravenous Given 02/15/21 1427)  insulin aspart (novoLOG) injection 8 Units (8 Units Subcutaneous  Given 02/15/21 1517)  ondansetron (ZOFRAN) injection 4 mg (4 mg Intravenous Given 02/15/21 1532)  sodium chloride 0.9 % bolus 1,000 mL (0 mLs Intravenous Stopped 02/15/21 2039)  famotidine (PEPCID) IVPB 20 mg premix (0 mg Intravenous Stopped 02/15/21 1609)  HYDROmorphone (DILAUDID) injection 1 mg (1 mg Intravenous Given 02/15/21 2027)  promethazine (PHENERGAN) 12.5 mg in sodium chloride 0.9 % 50 mL IVPB (0 mg Intravenous Stopped 02/15/21 2109)  insulin aspart (novoLOG) injection 6 Units (6 Units Subcutaneous Given 02/15/21 2009)  sodium chloride (PF) 0.9 % injection (  Given by Other 02/15/21 2030)  iohexol (OMNIPAQUE) 300 MG/ML solution 100 mL (100 mLs Intravenous Contrast Given 02/15/21 2039)    Mobility walks

## 2021-02-15 NOTE — ED Notes (Signed)
Pt states there was no improvement with pain or nausea after medications

## 2021-02-15 NOTE — H&P (Signed)
History and Physical    [name] ZOX:[id number] DOB: 07-12-1986 DOA: 02/15/2021  PCP: Just, Laurita Quint, FNP (Inactive)   Patient coming from: Home  Chief Complaint: Abdominal pain, nausea and vomiting, elevated blood sugar  HPI: [name] is a 35 y.o. female with medical history significant for DMT1 with peripheral neuropathy who is on chronic pain medications presents for abdominal pain with nausea and vomiting that started last evening but worsened this morning.  States he has not been able to eat or drink all day secondary to the nausea and vomiting.  She reports she has felt hot and cold at times and feels weak but has not had any falls.  She denies any fever.  She reports she has generalized abdominal pain that does not radiate and can be severe at times which she rates as a 10 out of 10.  She has not had any dysuria.  She denies any diarrhea.  She denies any chest pain or palpitations.  She denies cough or shortness of breath.  She reports her blood sugar was 220 this morning when she woke up.  She has not rechecked it since then.  She takes Levemir 60 units at night and uses sliding scale insulin during the day.  She reports that hemoglobin A1c in January or February of this year that was over 14.  She reports she has not been to see her doctor for diabetes control in the last few months as the doctor closed her office and she has not reestablished care with anyone else. Denies tobacco or illicit drug use.  She states she rarely drinks alcohol socially but has not had any in the last month. She reports she started her menstrual period yesterday.  ED Course: She has been hemodynamically stable in the emergency room.  Initial blood sugar was 475.  Was given SQ insulin and IV fluids in the emergency room.  Blood sugar has come down some.  She was not acidotic.  Urine did have ketones and beta Droxia butyrate level was elevated at 1.74.  pH was 7.343 with a bicarb of  25.5 and no anion gap.  Hospitalist service was asked to evaluate patient for further management overnight  Review of Systems:  General: Denies weakness, fever, chills, weight loss, night sweats.  Denies dizziness.   HENT: Denies head trauma, headache, denies change in hearing, tinnitus.  Denies nasal congestion or bleeding.  Denies sore throat, sores in mouth.  Denies difficulty swallowing Eyes: Denies blurry vision, pain in eye, drainage.  Denies discoloration of eyes. Neck: Denies pain.  Denies swelling.  Denies pain with movement. Cardiovascular: Denies chest pain, palpitations.  Denies edema.  Denies orthopnea Respiratory: Denies shortness of breath, cough.  Denies wheezing.  Denies sputum production Gastrointestinal: Reports abdominal pain, nausea, vomiting. Denies diarrhea.  Denies melena.  Denies hematemesis. Musculoskeletal: Denies limitation of movement. Denies deformity or swelling. Denies arthralgias or myalgias. Genitourinary: Denies pelvic pain.  Denies urinary frequency or hesitancy.  Denies dysuria.  Skin: Denies rash.  Denies petechiae, purpura, ecchymosis. Neurological: Denies syncope. Denies seizure activity. Denies paresthesia.  Denies slurred speech, drooping face.  Denies visual change. Psychiatric: Denies depression, anxiety.  Denies hallucinations.  Past Medical History:  Diagnosis Date   Chronic painful diabetic neuropathy ([place])    Diabetes mellitus    Type 1   History of chicken pox    Neuropathy 07/2012    Past Surgical History:  Procedure Laterality Date   CESAREAN SECTION  only one C/S per pt.   DILATION AND CURETTAGE OF UTERUS      Social History  reports that she has never smoked. She has never used smokeless tobacco. She reports current drug use. Drug: Marijuana. She reports that she does not drink alcohol.  Allergies  Allergen Reactions   Pramipexole Shortness Of Breath    Family History  Problem Relation Age of Onset   Hypertension Mother     Neuropathy Neg Hx      Prior to Admission medications   Medication Sig Start Date End Date Taking? Authorizing Provider  BELBUCA 150 MCG FILM Take 150 mg by mouth in the morning and at bedtime. 12/29/19   [provider]  clobetasol (TEMOVATE) 0.05 % GEL Apply 1 application topically 2 (two) times daily. 08/29/20   Just, Laurita Quint, FNP  Continuous Blood Gluc Receiver (FREESTYLE LIBRE 14 DAY READER) DEVI Apply 1 Device topically continuous. E10.65 10/27/19   Forrest Moron, MD  Continuous Blood Gluc Sensor (FREESTYLE LIBRE 14 DAY SENSOR) MISC Apply 1 Device topically every 14 (fourteen) days. E10.65 10/27/19   Forrest Moron, MD  GRALISE 600 MG TABS Take 600 mg by mouth in the morning, at noon, and at bedtime. 03/30/20   [provider]  hydrOXYzine (ATARAX/VISTARIL) 25 MG tablet Take 1 tablet (25 mg total) by mouth every 6 (six) hours as needed for anxiety. 04/23/20   Tacy Learn, PA-C  insulin aspart (NOVOLOG FLEXPEN) 100 UNIT/ML FlexPen Inject 15-20 Units into the skin 3 (three) times daily with meals. plus sliding scale 11/01/20   Just, Laurita Quint, FNP  insulin detemir (LEVEMIR) 100 UNIT/ML injection Inject 0.8 mLs (80 Units total) into the skin at bedtime. Fasting BG goal 100-140 Increase your levemir by 2 every 3 days if not in that range 11/01/20   Just, Laurita Quint, FNP  Insulin Pen Needle (B-D UF III MINI PEN NEEDLES) 31G X 5 MM MISC To use with Insulin pens, as directed 07/06/20   Just, Laurita Quint, FNP  Insulin Pen Needle 31G X 5 MM MISC To use with insulin 07/06/20   Just, Laurita Quint, FNP  Insulin Syringe-Needle U-100 (INSULIN SYRINGE 1CC/31GX5/16") 31G X 5/16" 1 ML MISC To use with insulin 07/06/20   Just, Laurita Quint, FNP  lidocaine (XYLOCAINE) 5 % ointment Apply 1 application topically at bedtime. To feet 03/11/19   Melvenia Beam, MD  nystatin (MYCOSTATIN/NYSTOP) powder Apply 1 application topically 3 (three) times daily. 08/29/20   Just, Laurita Quint, FNP  rosuvastatin (CRESTOR)  10 MG tablet Take 1 tablet (10 mg total) by mouth daily. 07/06/20   Just, Laurita Quint, FNP  Vitamin D, Ergocalciferol, (DRISDOL) 1.25 MG (50000 UNIT) CAPS capsule Take 1 capsule (50,000 Units total) by mouth every 7 (seven) days. 08/29/20   JustLaurita Quint, FNP    Physical Exam: Vitals:   02/15/21 1715 02/15/21 1730 02/15/21 1800 02/15/21 1837  BP: 128/76 132/84 124/76 (!) 131/99  Pulse: 97  69 71  Resp: 15  16 18   Temp:      TempSrc:      SpO2: 97%  98% 100%  Weight:      Height:        Constitutional: NAD, calm, comfortable Vitals:   02/15/21 1715 02/15/21 1730 02/15/21 1800 02/15/21 1837  BP: 128/76 132/84 124/76 (!) 131/99  Pulse: 97  69 71  Resp: 15  16 18   Temp:      TempSrc:  SpO2: 97%  98% 100%  Weight:      Height:       General: WDWN, Alert and oriented x3.  Eyes: EOMI, PERRL, conjunctivae normal.  Sclera nonicteric HENT:  [place]/AT, external ears normal.  Nares patent without epistasis.  Mucous membranes are dry.  Neck: Soft, normal range of motion, supple, no masses, no thyromegaly.  Trachea midline Respiratory: clear to auscultation bilaterally, no wheezing, no crackles. Normal respiratory effort. No accessory muscle use.  Cardiovascular: Regular rate and rhythm, no murmurs / rubs / gallops. No extremity edema. 2+ pedal pulses.  Abdomen: Soft, Diffuse tenderness to palpation, nondistended, no rebound or guarding.  No masses palpated. Bowel sounds normoactive Musculoskeletal: FROM. no cyanosis. No joint deformity upper and lower extremities. Normal muscle tone.  Skin: Warm, dry, intact no rashes, lesions, ulcers. No induration Neurologic: CN 2-12 grossly intact.  Normal speech. Sensation intact to touch. Strength 5/5 in all extremities.   Psychiatric: Normal judgment and insight.  Normal mood.    Labs on Admission: I have personally reviewed following labs and imaging studies  CBC: Recent Labs  Lab 02/15/21 1402  WBC 7.3  HGB 13.1  HCT 39.4  MCV 85.3  PLT  308    Basic Metabolic Panel: Recent Labs  Lab 02/15/21 1402  NA 137  K 3.8  CL 98  CO2 25  GLUCOSE 475*  BUN 13  CREATININE 0.82  CALCIUM 9.9    GFR: Estimated Creatinine Clearance: 98.6 mL/min (by C-G formula based on SCr of 0.82 mg/dL).  Liver Function Tests: No results for input(s): AST, ALT, ALKPHOS, BILITOT, PROT, ALBUMIN in the last 168 hours.  Urine analysis:    Component Value Date/Time   COLORURINE STRAW (A) 02/15/2021 1704   APPEARANCEUR CLEAR 02/15/2021 1704   LABSPEC 1.024 02/15/2021 1704   PHURINE 6.0 02/15/2021 1704   GLUCOSEU >=500 (A) 02/15/2021 1704   HGBUR MODERATE (A) 02/15/2021 1704   BILIRUBINUR NEGATIVE 02/15/2021 1704   BILIRUBINUR negative 11/01/2020 1405   BILIRUBINUR Negative 05/04/2018 0838   KETONESUR 80 (A) 02/15/2021 1704   PROTEINUR NEGATIVE 02/15/2021 1704   UROBILINOGEN 0.2 11/01/2020 1405   UROBILINOGEN 0.2 05/05/2017 1824   NITRITE NEGATIVE 02/15/2021 1704   LEUKOCYTESUR NEGATIVE 02/15/2021 1704    Radiological Exams on Admission: No results found.  Assessment/Plan Principal Problem:   Uncontrolled diabetes mellitus with hyperglycemia  Placed on progressive care unit for observation with type 1 diabetes mellitus with hyperglycemia.  Patient reports she had a hemoglobin A1c in January February that was over 14.  She has not followed up with an endocrinologist recently as the when she was seen closed her practice and she has not established care with anyone else yet. She was given 8 units of insulin in the emergency room.  Sugars come down some but will give another 6 units now.  Continue IV fluid hydration with LR. Monitor blood sugars every 2 hours overnight Sliding scale insulin provided for glycemic control Continue basal insulin with Levemir at night  Active Problems:   Abdominal pain Check lipase level to make sure is no component of pancreatitis.  Check CT of the abdomen pelvis for further evaluation of severe abdominal  pain    Nausea and vomiting Diabetic provided as needed.  IV fluid hydration    Diabetic peripheral neuropathy associated with type 1 diabetes mellitus Chronic     DVT prophylaxis: [place] score low. TED hose and early ambulation for DVT prophylaxis.   Code Status:   Full  code  Family Communication:  Diagnosis and plan discussed with patient.  Patient verbalized understanding agrees to plan.  Further recommendations to follow as clinical indicated Disposition Plan:   Patient is from:  Home  Anticipated DC to:  Home  Anticipated DC date:  Anticipate less than 2 midnight stay  Anticipated DC barriers: No barriers to discharge and at this time  Admission status:  Observation   Eben Burow MD [facility]  How to contact the [facility] Attending or Consulting provider [place] or covering provider during after hours [place], for this patient?   Check the care team in [facility] and look for a) attending/consulting [facility] provider listed and b) the [facility] team listed Log into [email or web address] and use [place]'s universal password to access. If you do not have the password, please contact the hospital operator. Locate the [facility] provider you are looking for under [facility] and page to a number that you can be directly reached. If you still have difficulty reaching the provider, please page the [facility] (Director on Call) for the Hospitalists listed on amion for assistance.  02/15/2021, 8:01 PM

## 2021-02-15 NOTE — ED Notes (Signed)
Pt has been to the RR multiple times, has not provided staff with urine sample. Pt aware sample is needed.

## 2021-02-15 NOTE — ED Notes (Signed)
Pt given ice chips

## 2021-02-15 NOTE — ED Notes (Signed)
Pt to CT via stretcher

## 2021-02-15 NOTE — ED Triage Notes (Signed)
Patient c/o abdominal pain and emesis this AM. Patient states her blood sugars have been high.

## 2021-02-15 NOTE — Progress Notes (Signed)
Patient actively throwing up and complaining of 10/10 pain to the abdomen. Night provider notified via secure chat, see new orders.

## 2021-02-16 ENCOUNTER — Observation Stay (HOSPITAL_COMMUNITY): Payer: BC Managed Care – PPO

## 2021-02-16 DIAGNOSIS — E1065 Type 1 diabetes mellitus with hyperglycemia: Secondary | ICD-10-CM | POA: Diagnosis present

## 2021-02-16 DIAGNOSIS — Z8249 Family history of ischemic heart disease and other diseases of the circulatory system: Secondary | ICD-10-CM | POA: Diagnosis not present

## 2021-02-16 DIAGNOSIS — K8010 Calculus of gallbladder with chronic cholecystitis without obstruction: Secondary | ICD-10-CM | POA: Diagnosis present

## 2021-02-16 DIAGNOSIS — E1042 Type 1 diabetes mellitus with diabetic polyneuropathy: Secondary | ICD-10-CM | POA: Diagnosis present

## 2021-02-16 DIAGNOSIS — R112 Nausea with vomiting, unspecified: Secondary | ICD-10-CM | POA: Diagnosis present

## 2021-02-16 DIAGNOSIS — Z79899 Other long term (current) drug therapy: Secondary | ICD-10-CM | POA: Diagnosis not present

## 2021-02-16 DIAGNOSIS — K8020 Calculus of gallbladder without cholecystitis without obstruction: Secondary | ICD-10-CM | POA: Diagnosis present

## 2021-02-16 DIAGNOSIS — T383X6A Underdosing of insulin and oral hypoglycemic [antidiabetic] drugs, initial encounter: Secondary | ICD-10-CM | POA: Diagnosis present

## 2021-02-16 DIAGNOSIS — F1299 Cannabis use, unspecified with unspecified cannabis-induced disorder: Secondary | ICD-10-CM | POA: Diagnosis present

## 2021-02-16 DIAGNOSIS — R109 Unspecified abdominal pain: Secondary | ICD-10-CM

## 2021-02-16 DIAGNOSIS — E1043 Type 1 diabetes mellitus with diabetic autonomic (poly)neuropathy: Secondary | ICD-10-CM | POA: Diagnosis present

## 2021-02-16 DIAGNOSIS — Z888 Allergy status to other drugs, medicaments and biological substances status: Secondary | ICD-10-CM | POA: Diagnosis not present

## 2021-02-16 DIAGNOSIS — E876 Hypokalemia: Secondary | ICD-10-CM | POA: Diagnosis present

## 2021-02-16 DIAGNOSIS — Z794 Long term (current) use of insulin: Secondary | ICD-10-CM | POA: Diagnosis not present

## 2021-02-16 DIAGNOSIS — Z20822 Contact with and (suspected) exposure to covid-19: Secondary | ICD-10-CM | POA: Diagnosis present

## 2021-02-16 DIAGNOSIS — K2970 Gastritis, unspecified, without bleeding: Secondary | ICD-10-CM | POA: Diagnosis present

## 2021-02-16 DIAGNOSIS — R739 Hyperglycemia, unspecified: Secondary | ICD-10-CM | POA: Diagnosis present

## 2021-02-16 DIAGNOSIS — K3184 Gastroparesis: Secondary | ICD-10-CM | POA: Diagnosis present

## 2021-02-16 DIAGNOSIS — Y92009 Unspecified place in unspecified non-institutional (private) residence as the place of occurrence of the external cause: Secondary | ICD-10-CM | POA: Diagnosis not present

## 2021-02-16 LAB — GLUCOSE, CAPILLARY
Glucose-Capillary: 110 mg/dL — ABNORMAL HIGH (ref 70–99)
Glucose-Capillary: 121 mg/dL — ABNORMAL HIGH (ref 70–99)
Glucose-Capillary: 127 mg/dL — ABNORMAL HIGH (ref 70–99)
Glucose-Capillary: 135 mg/dL — ABNORMAL HIGH (ref 70–99)
Glucose-Capillary: 193 mg/dL — ABNORMAL HIGH (ref 70–99)
Glucose-Capillary: 198 mg/dL — ABNORMAL HIGH (ref 70–99)
Glucose-Capillary: 202 mg/dL — ABNORMAL HIGH (ref 70–99)

## 2021-02-16 LAB — BASIC METABOLIC PANEL
Anion gap: 9 (ref 5–15)
BUN: 10 mg/dL (ref 6–20)
CO2: 26 mmol/L (ref 22–32)
Calcium: 9.4 mg/dL (ref 8.9–10.3)
Chloride: 105 mmol/L (ref 98–111)
Creatinine, Ser: 0.65 mg/dL (ref 0.44–1.00)
GFR, Estimated: >60 mL/min (ref >60)
Glucose, Bld: 187 mg/dL — ABNORMAL HIGH (ref 70–99)
Potassium: 3.6 mmol/L (ref 3.5–5.1)
Sodium: 140 mmol/L (ref 135–145)

## 2021-02-16 LAB — HEPATIC FUNCTION PANEL
ALT: 26 U/L (ref 0–44)
AST: 18 U/L (ref 15–41)
Albumin: 4.2 g/dL (ref 3.5–5.0)
Alkaline Phosphatase: 54 U/L (ref 38–126)
Bilirubin, Direct: 0.1 mg/dL (ref 0.0–0.2)
Indirect Bilirubin: 0.5 mg/dL (ref 0.3–0.9)
Total Bilirubin: 0.6 mg/dL (ref 0.3–1.2)
Total Protein: 7.6 g/dL (ref 6.5–8.1)

## 2021-02-16 LAB — HIV ANTIBODY (ROUTINE TESTING W REFLEX): HIV Screen 4th Generation wRfx: NONREACTIVE

## 2021-02-16 LAB — BETA-HYDROXYBUTYRIC ACID: Beta-Hydroxybutyric Acid: 1.02 mmol/L — ABNORMAL HIGH (ref 0.05–0.27)

## 2021-02-16 MED ORDER — PANTOPRAZOLE SODIUM 40 MG PO TBEC
40.0000 mg | DELAYED_RELEASE_TABLET | Freq: Every day | ORAL | Status: DC
  Administered 2021-02-16 – 2021-02-17 (×2): 40 mg via ORAL
  Filled 2021-02-16 (×2): qty 1

## 2021-02-16 MED ORDER — INSULIN DETEMIR 100 UNIT/ML ~~LOC~~ SOLN
40.0000 [IU] | Freq: Every day | SUBCUTANEOUS | Status: DC
  Filled 2021-02-16: qty 0.4

## 2021-02-16 MED ORDER — HYDROMORPHONE HCL 1 MG/ML IJ SOLN
1.0000 mg | Freq: Once | INTRAMUSCULAR | Status: AC
  Administered 2021-02-16: 1 mg via INTRAVENOUS
  Filled 2021-02-16: qty 1

## 2021-02-16 MED ORDER — TRAMADOL HCL 50 MG PO TABS
50.0000 mg | ORAL_TABLET | Freq: Four times a day (QID) | ORAL | Status: DC | PRN
  Administered 2021-02-16 (×2): 50 mg via ORAL
  Filled 2021-02-16 (×2): qty 1

## 2021-02-16 MED ORDER — HYDROXYZINE HCL 25 MG PO TABS
25.0000 mg | ORAL_TABLET | Freq: Four times a day (QID) | ORAL | Status: DC | PRN
  Administered 2021-02-16 – 2021-02-17 (×3): 25 mg via ORAL
  Filled 2021-02-16 (×3): qty 1

## 2021-02-16 MED ORDER — ACETAMINOPHEN 325 MG PO TABS
650.0000 mg | ORAL_TABLET | Freq: Four times a day (QID) | ORAL | Status: DC | PRN
  Administered 2021-02-16 – 2021-02-17 (×3): 650 mg via ORAL
  Filled 2021-02-16 (×3): qty 2

## 2021-02-16 MED ORDER — INSULIN DETEMIR 100 UNIT/ML ~~LOC~~ SOLN
50.0000 [IU] | Freq: Every day | SUBCUTANEOUS | Status: DC
  Administered 2021-02-16: 50 [IU] via SUBCUTANEOUS
  Filled 2021-02-16: qty 0.5

## 2021-02-16 NOTE — Progress Notes (Signed)
PROGRESS NOTE    [name]  ACZ:[id number] DOB: 1986/05/05 DOA: 02/15/2021 PCP: Just, Laurita Quint, FNP (Inactive)   Brief Narrative:  35 year old with history of diabetes mellitus type 1 with peripheral neuropathy, marijuana use admitted to the hospital for nausea vomiting and abdominal discomfort.  She also reports of missing her home insulin.  Upon admission she was noted to be severely hyper glycemic without anion gap acidosis.  She was started on insulin and IV fluids.  CT of the abdomen pelvis showed gallstones.  She had to be made n.p.o. due to severe nausea and vomiting.   Assessment & Plan:   Principal Problem:   Uncontrolled diabetes mellitus with hyperglycemia ([place]) Active Problems:   Diabetic peripheral neuropathy associated with type 1 diabetes mellitus (HCC)   Abdominal pain   Nausea and vomiting  Nausea and vomiting - Nonspecific.  Could be secondary to gastritis versus diabetic gastroparesis, marijuana use, gallstones/cholecystitis. - PPI daily.  Lipase - WNL - Right upper quadrant ultrasound = neg - Reglan every 8 hours - Clear liquid diet, advance as tolerated - IV fluids -Counseled to quit using marijuana  Diabetes mellitus type 1, insulin-dependent with hyperglycemia Medication noncompliance Peripheral neuropathy - Levemir 40 units at bedtime.  Insulin sliding scale and Accu-Chek - A1c= pending.    DVT prophylaxis: Place TED hose Start: 02/15/21 2152 Code Status: Full code Family Communication: None  Status is: Inpatient  Remains inpatient appropriate because:Inpatient level of care appropriate due to severity of illness.  Maintain hospital stay, not tolerating orals.  On IV fluids.  Dispo: The patient is from: Home              Anticipated d/c is to: Home              Patient currently is not medically stable to d/c.   Difficult to place patient No   Subjective: During my visit she was feeling nauseous but nausea had improved since the  time of admission.  She was wanting to try something to eat and drink orally.  Admits to smoking marijuana daily at home.  Review of Systems Otherwise negative except as per HPI, including: General: Denies fever, chills, night sweats or unintended weight loss. Resp: Denies cough, wheezing, shortness of breath. Cardiac: Denies chest pain, palpitations, orthopnea, paroxysmal nocturnal dyspnea. GI: Denies diarrhea or constipation GU: Denies dysuria, frequency, hesitancy or incontinence MS: Denies muscle aches, joint pain or swelling Neuro: Denies headache, neurologic deficits (focal weakness, numbness, tingling), abnormal gait Psych: Denies anxiety, depression, SI/HI/AVH Skin: Denies new rashes or lesions ID: Denies sick contacts, exotic exposures, travel  Examination:  General exam: Appears calm and comfortable  Respiratory system: Clear to auscultation. Respiratory effort normal. Cardiovascular system: S1 & S2 heard, RRR. No JVD, murmurs, rubs, gallops or clicks. No pedal edema. Gastrointestinal system: Abdomen is nondistended, soft and nontender. No organomegaly or masses felt. Normal bowel sounds heard. Central nervous system: Alert and oriented. No focal neurological deficits. Extremities: Symmetric 5 x 5 power. Skin: No rashes, lesions or ulcers Psychiatry: Judgement and insight appear normal. Mood & affect appropriate.     Objective: Vitals:   02/15/21 2015 02/15/21 2201 02/16/21 0600 02/16/21 1136  BP: 131/87 132/72 124/88 122/85  Pulse: 76 65 63 69  Resp: 16 20 20 20   Temp:  98.9 F (37.2 C) 99.9 F (37.7 C) 98.9 F (37.2 C)  TempSrc:  Oral Oral Oral  SpO2: 100% 100% 99% 100%  Weight:      Height:  Intake/Output Summary (Last 24 hours) at 02/16/2021 1419 Last data filed at 02/16/2021 0523 Gross per 24 hour  Intake 2099.45 ml  Output --  Net 2099.45 ml   Filed Weights   02/15/21 1343  Weight: 72.6 kg     Data Reviewed:   CBC: Recent Labs  Lab  02/15/21 1402  WBC 7.3  HGB 13.1  HCT 39.4  MCV 85.3  PLT 947   Basic Metabolic Panel: Recent Labs  Lab 02/15/21 1402 02/16/21 0331  NA 137 140  K 3.8 3.6  CL 98 105  CO2 25 26  GLUCOSE 475* 187*  BUN 13 10  CREATININE 0.82 0.65  CALCIUM 9.9 9.4   GFR: Estimated Creatinine Clearance: 101 mL/min (by C-G formula based on SCr of 0.65 mg/dL). Liver Function Tests: Recent Labs  Lab 02/16/21 0926  AST 18  ALT 26  ALKPHOS 54  BILITOT 0.6  PROT 7.6  ALBUMIN 4.2   Recent Labs  Lab 02/15/21 2006  LIPASE 22   No results for input(s): AMMONIA in the last 168 hours. Coagulation Profile: No results for input(s): INR, PROTIME in the last 168 hours. Cardiac Enzymes: No results for input(s): CKTOTAL, CKMB, CKMBINDEX, TROPONINI in the last 168 hours. BNP (last 3 results) No results for input(s): PROBNP in the last 8760 hours. HbA1C: No results for input(s): HGBA1C in the last 72 hours. CBG: Recent Labs  Lab 02/15/21 2359 02/16/21 0200 02/16/21 0602 02/16/21 0815 02/16/21 1137  GLUCAP 202* 198* 135* 121* 127*   Lipid Profile: No results for input(s): CHOL, HDL, LDLCALC, TRIG, CHOLHDL, LDLDIRECT in the last 72 hours. Thyroid Function Tests: No results for input(s): TSH, T4TOTAL, FREET4, T3FREE, THYROIDAB in the last 72 hours. Anemia Panel: No results for input(s): VITAMINB12, FOLATE, FERRITIN, TIBC, IRON, RETICCTPCT in the last 72 hours. Sepsis Labs: No results for input(s): PROCALCITON, LATICACIDVEN in the last 168 hours.  Recent Results (from the past 240 hour(s))  Resp Panel by RT-PCR (Flu A&B, Covid) Nasopharyngeal Swab     Status: None   Collection Time: 02/15/21  8:14 PM   Specimen: Nasopharyngeal Swab; Nasopharyngeal(NP) swabs in vial transport medium  Result Value Ref Range Status   SARS Coronavirus 2 by RT PCR NEGATIVE NEGATIVE Final    Comment: (NOTE) SARS-CoV-2 target nucleic acids are NOT DETECTED.  The SARS-CoV-2 RNA is generally detectable in  upper respiratory specimens during the acute phase of infection. The lowest concentration of SARS-CoV-2 viral copies this assay can detect is 138 copies/mL. A negative result does not preclude SARS-Cov-2 infection and should not be used as the sole basis for treatment or other patient management decisions. A negative result may occur with  improper specimen collection/handling, submission of specimen other than nasopharyngeal swab, presence of viral mutation(s) within the areas targeted by this assay, and inadequate number of viral copies(<138 copies/mL). A negative result must be combined with clinical observations, patient history, and epidemiological information. The expected result is Negative.  Fact Sheet for Patients:  EntrepreneurPulse.com.au  Fact Sheet for Healthcare Providers:  IncredibleEmployment.be  This test is no t yet approved or cleared by the [place] FDA and  has been authorized for detection and/or diagnosis of SARS-CoV-2 by FDA under an Emergency Use Authorization (EUA). This EUA will remain  in effect (meaning this test can be used) for the duration of the COVID-19 declaration under Section 564(b)(1) of the Act, 21 U.S.C.section 360bbb-3(b)(1), unless the authorization is terminated  or revoked sooner.  Influenza A by PCR NEGATIVE NEGATIVE Final   Influenza B by PCR NEGATIVE NEGATIVE Final    Comment: (NOTE) The Xpert Xpress SARS-CoV-2/FLU/RSV plus assay is intended as an aid in the diagnosis of influenza from Nasopharyngeal swab specimens and should not be used as a sole basis for treatment. Nasal washings and aspirates are unacceptable for Xpert Xpress SARS-CoV-2/FLU/RSV testing.  Fact Sheet for Patients: EntrepreneurPulse.com.au  Fact Sheet for Healthcare Providers: IncredibleEmployment.be  This test is not yet approved or cleared by the [place] FDA and has been  authorized for detection and/or diagnosis of SARS-CoV-2 by FDA under an Emergency Use Authorization (EUA). This EUA will remain in effect (meaning this test can be used) for the duration of the COVID-19 declaration under Section 564(b)(1) of the Act, 21 U.S.C. section 360bbb-3(b)(1), unless the authorization is terminated or revoked.  Performed at [facility], [place] [address] [place],  [address]          Radiology Studies: CT ABDOMEN PELVIS W CONTRAST  Result Date: 02/15/2021 CLINICAL DATA:  Abdominal pain and vomiting. EXAM: CT ABDOMEN AND PELVIS WITH CONTRAST TECHNIQUE: Multidetector CT imaging of the abdomen and pelvis was performed using the standard protocol following bolus administration of intravenous contrast. CONTRAST:  137mL OMNIPAQUE IOHEXOL 300 MG/ML  SOLN COMPARISON:  September 17, 2003 FINDINGS: Lower chest: No acute abnormality. Hepatobiliary: No focal liver abnormality is seen. Subcentimeter gallstones are seen within the gallbladder lumen without evidence of gallbladder wall thickening or biliary dilatation. Pancreas: Unremarkable. No pancreatic ductal dilatation or surrounding inflammatory changes. Spleen: Normal in size without focal abnormality. Adrenals/Urinary Tract: Adrenal glands are unremarkable. Kidneys are normal, without renal calculi, focal lesion, or hydronephrosis. Bladder is unremarkable. Stomach/Bowel: Stomach is within normal limits. Appendix appears normal. No evidence of bowel wall thickening, distention, or inflammatory changes. Vascular/Lymphatic: No significant vascular findings are present. No enlarged abdominal or pelvic lymph nodes. Reproductive: A 2.1 cm x 2.0 cm heterogeneous uterine fibroid is suspected on the right. The bilateral adnexa are unremarkable. Other: No abdominal wall hernia or abnormality. No abdominopelvic ascites. Musculoskeletal: No acute or significant osseous findings. IMPRESSION: 1. Cholelithiasis. 2.  Suspected uterine fibroid. Correlation with nonemergent pelvic ultrasound is recommended. Electronically Signed   By: Virgina Norfolk M.D.   On: 02/15/2021 20:58   US Abdomen Limited RUQ (LIVER/GB)  Result Date: 02/16/2021 CLINICAL DATA:  Cholelithiasis. EXAM: ULTRASOUND ABDOMEN LIMITED RIGHT UPPER QUADRANT COMPARISON:  February 15, 2021. FINDINGS: Gallbladder: No gallstones or wall thickening visualized. No sonographic Murphy sign noted by sonographer. Probable sludge seen within gallbladder lumen. Common bile duct: Diameter: 5 mm which is within normal limits. Liver: No focal lesion identified. Within normal limits in parenchymal echogenicity. Portal vein is patent on color Doppler imaging with normal direction of blood flow towards the liver. Other: None. IMPRESSION: Probable sludge seen within gallbladder lumen. No other abnormality seen in the right upper quadrant of the abdomen. Electronically Signed   By: Marijo Conception M.D.   On: 02/16/2021 12:42        Scheduled Meds:  insulin aspart  0-15 Units Subcutaneous Q6H   insulin detemir  60 Units Subcutaneous QHS   metoCLOPramide (REGLAN) injection  10 mg Intravenous Q8H   pantoprazole  40 mg Oral Daily   Continuous Infusions:  lactated ringers 125 mL/hr at 02/16/21 0543     LOS: 0 days   Time spent= 35 mins    Mertice Uffelman Arsenio Loader, MD [facility]  If 7PM-7AM, please contact night-coverage  02/16/2021, 2:19 PM

## 2021-02-17 LAB — CBC
HCT: 35.8 % — ABNORMAL LOW (ref 36.0–46.0)
Hemoglobin: 11.9 g/dL — ABNORMAL LOW (ref 12.0–15.0)
MCH: 28.5 pg (ref 26.0–34.0)
MCHC: 33.2 g/dL (ref 30.0–36.0)
MCV: 85.9 fL (ref 80.0–100.0)
Platelets: 282 10*3/uL (ref 150–400)
RBC: 4.17 MIL/uL (ref 3.87–5.11)
RDW: 14.9 % (ref 11.5–15.5)
WBC: 11.9 10*3/uL — ABNORMAL HIGH (ref 4.0–10.5)
nRBC: 0 % (ref 0.0–0.2)

## 2021-02-17 LAB — BASIC METABOLIC PANEL
Anion gap: 8 (ref 5–15)
BUN: 10 mg/dL (ref 6–20)
CO2: 29 mmol/L (ref 22–32)
Calcium: 9 mg/dL (ref 8.9–10.3)
Chloride: 100 mmol/L (ref 98–111)
Creatinine, Ser: 0.64 mg/dL (ref 0.44–1.00)
GFR, Estimated: >60 mL/min (ref >60)
Glucose, Bld: 88 mg/dL (ref 70–99)
Potassium: 2.9 mmol/L — ABNORMAL LOW (ref 3.5–5.1)
Sodium: 137 mmol/L (ref 135–145)

## 2021-02-17 LAB — HEMOGLOBIN A1C
Hgb A1c MFr Bld: 12.8 % — ABNORMAL HIGH (ref 4.8–5.6)
Mean Plasma Glucose: 321 mg/dL

## 2021-02-17 LAB — GLUCOSE, CAPILLARY
Glucose-Capillary: 104 mg/dL — ABNORMAL HIGH (ref 70–99)
Glucose-Capillary: 85 mg/dL (ref 70–99)

## 2021-02-17 LAB — MAGNESIUM: Magnesium: 1.8 mg/dL (ref 1.7–2.4)

## 2021-02-17 MED ORDER — METOCLOPRAMIDE HCL 10 MG PO TABS: 10.0000 mg | ORAL_TABLET | Freq: Three times a day (TID) | ORAL | 0 refills | Status: DC | PRN

## 2021-02-17 MED ORDER — ONDANSETRON 4 MG PO TBDP: 4.0000 mg | ORAL_TABLET | Freq: Three times a day (TID) | ORAL | 0 refills | Status: DC | PRN

## 2021-02-17 MED ORDER — POTASSIUM CHLORIDE CRYS ER 20 MEQ PO TBCR: 20.0000 meq | EXTENDED_RELEASE_TABLET | Freq: Once | ORAL | Status: DC

## 2021-02-17 MED ORDER — POTASSIUM CHLORIDE 20 MEQ PO PACK
40.0000 meq | PACK | Freq: Once | ORAL | Status: AC
  Administered 2021-02-17: 40 meq via ORAL
  Filled 2021-02-17: qty 2

## 2021-02-17 MED ORDER — MAGNESIUM OXIDE -MG SUPPLEMENT 400 (240 MG) MG PO TABS
800.0000 mg | ORAL_TABLET | Freq: Once | ORAL | Status: AC
  Administered 2021-02-17: 800 mg via ORAL
  Filled 2021-02-17: qty 2

## 2021-02-17 MED ORDER — PANTOPRAZOLE SODIUM 40 MG PO TBEC: 40.0000 mg | DELAYED_RELEASE_TABLET | Freq: Every day | ORAL | 0 refills | Status: DC

## 2021-02-17 MED ORDER — INSULIN DETEMIR 100 UNIT/ML ~~LOC~~ SOLN: 60.0000 [IU] | Freq: Every day | SUBCUTANEOUS | Status: DC

## 2021-02-17 MED ORDER — POTASSIUM CHLORIDE CRYS ER 20 MEQ PO TBCR
40.0000 meq | EXTENDED_RELEASE_TABLET | Freq: Once | ORAL | Status: AC
  Administered 2021-02-17: 40 meq via ORAL
  Filled 2021-02-17: qty 2

## 2021-02-17 MED ORDER — POTASSIUM CHLORIDE 10 MEQ/100ML IV SOLN: 10.0000 meq | INTRAVENOUS | Status: DC

## 2021-02-17 NOTE — Progress Notes (Signed)
Pt discharged, instructions reviewed with pt, pt acknowledged she has routine for insulin administration and food preferences at home. Pt acknowledged understanding of discharge instructions. Pt stable at the time of discharge. SRP, RN

## 2021-02-17 NOTE — Discharge Summary (Signed)
Physician Discharge Summary  Eun Vermeer McAdoo-Collier CZY:[id number] DOB: [birth year] DOA: 02/15/2021  PCP: Just, Laurita Quint, FNP (Inactive)  Admit date: 02/15/2021 Discharge date: 02/17/2021  Admitted From: Home Disposition: Home  Recommendations for Outpatient Follow-up:  Follow up with PCP in 1-2 weeks BMP/magnesium check in 4 days Zofran and Reglan given for nausea and vomiting PPI daily before meals Advised to obtain outpatient gastric emptying study, to be arranged by PCP Advised to take her insulin as prescribed Counseled quit smoking marijuana  Home Health: None Equipment/Devices:None Discharge Condition: Stable CODE STATUS: Full  Diet recommendation: Diabetic  Brief/Interim Summary: 35 year old with history of diabetes mellitus type 1 with peripheral neuropathy, marijuana use admitted to the hospital for nausea vomiting and abdominal discomfort.  She also reports of missing her home insulin.  Upon admission she was noted to be severely hyper glycemic without anion gap acidosis.  She was started on insulin and IV fluids.  CT of the abdomen pelvis showed gallstones.  She had to be made n.p.o. due to severe nausea and vomiting.  Right upper quadrant ultrasound was negative for any acute pathology.  Started on PPI daily, LFTs and lipase were normal.  Her nausea vomiting improved the following day and she insisted on being discharged very early despite of low potassium due to her ride issues.  I advised her it would have been better if she received potassium repletion in the hospital and recheck prior to discharge. She was given an extra dose to take at home and asked to get her repeat lab work in the next 3-4 days with her PCP.  Body mass index is 25.82 kg/m.      Nausea and vomiting, improved - Nonspecific.  Could be secondary to gastritis versus diabetic gastroparesis, marijuana use, gallstones/cholecystitis. - PPI daily.  Lipase - WNL - Right upper quadrant ultrasound =  negative - Reglan every 8 hours - Tolerating orals without nausea - IV fluids -Counseled to quit using marijuana  Hypokalemia - Patient did not want IV repletion and did not want to wait for this to be repleted.  She wants to take her medication and be discharged without rechecking it.  I have given her extra dose at the time of discharge as well.  Advised her to check labs with her PCP next 3-4 days.   Diabetes mellitus type 1, insulin-dependent with hyperglycemia Medication noncompliance Peripheral neuropathy - Home Levemir, continue checking blood glucose at home - A1c= pending at discharge    Discharge Diagnoses:  Principal Problem:   Uncontrolled diabetes mellitus with hyperglycemia ([place]) Active Problems:   Diabetic peripheral neuropathy associated with type 1 diabetes mellitus ([facility])   Abdominal pain   Nausea and vomiting     Subjective: Denies any complaints, wants to be discharged early on today as she is tolerating orals.  Discharge Exam: Vitals:   02/16/21 2115 02/17/21 0608  BP: 126/89 (!) 157/86  Pulse: 73 (!) 58  Resp: (!) 21 20  Temp: 98.9 F (37.2 C) 97.8 F (36.6 C)  SpO2: 100% 100%   Vitals:   02/16/21 0600 02/16/21 1136 02/16/21 2115 02/17/21 0608  BP: 124/88 122/85 126/89 (!) 157/86  Pulse: 63 69 73 (!) 58  Resp: 20 20 (!) 21 20  Temp: 99.9 F (37.7 C) 98.9 F (37.2 C) 98.9 F (37.2 C) 97.8 F (36.6 C)  TempSrc: Oral Oral Oral Oral  SpO2: 99% 100% 100% 100%  Weight:      Height:  General: Pt is alert, awake, not in acute distress Cardiovascular: RRR, S1/S2 +, no rubs, no gallops Respiratory: CTA bilaterally, no wheezing, no rhonchi Abdominal: Soft, NT, ND, bowel sounds + Extremities: no edema, no cyanosis  Discharge Instructions   Allergies as of 02/17/2021       Reactions   Pramipexole Shortness Of Breath        Medication List     STOP taking these medications    Belbuca 150 MCG Film Generic drug: Buprenorphine  HCl   rosuvastatin 10 MG tablet Commonly known as: Crestor       TAKE these medications    B-D UF III MINI PEN NEEDLES 31G X 5 MM Misc Generic drug: Insulin Pen Needle To use with Insulin pens, as directed   Insulin Pen Needle 31G X 5 MM Misc To use with insulin   FreeStyle Libre 14 Day Reader Kerrin Mo Apply 1 Device topically continuous. E10.65   FreeStyle Libre 14 Day Sensor Misc Apply 1 Device topically every 14 (fourteen) days. E10.65   Gralise 600 MG Tabs Generic drug: Gabapentin (Once-Daily) Take 600 mg by mouth in the morning, at noon, and at bedtime.   hydrOXYzine 25 MG tablet Commonly known as: ATARAX/VISTARIL Take 1 tablet (25 mg total) by mouth every 6 (six) hours as needed for anxiety.   insulin detemir 100 UNIT/ML injection Commonly known as: Levemir Inject 0.6 mLs (60 Units total) into the skin at bedtime. Fasting BG goal 100-140 Increase your levemir by 2 every 3 days if not in that range   INSULIN SYRINGE 1CC/31GX5/16" 31G X 5/16" 1 ML Misc To use with insulin   metoCLOPramide 10 MG tablet Commonly known as: REGLAN Take 1 tablet (10 mg total) by mouth every 8 (eight) hours as needed for up to 10 days for nausea.   NovoLOG FlexPen 100 UNIT/ML FlexPen Generic drug: insulin aspart Inject 15-20 Units into the skin 3 (three) times daily with meals. plus sliding scale   ondansetron 4 MG disintegrating tablet Commonly known as: Zofran ODT Take 1 tablet (4 mg total) by mouth every 8 (eight) hours as needed for nausea or vomiting.   pantoprazole 40 MG tablet Commonly known as: PROTONIX Take 1 tablet (40 mg total) by mouth daily before breakfast.       ASK your doctor about these medications    clobetasol 0.05 % Gel Commonly known as: TEMOVATE Apply 1 application topically 2 (two) times daily.   nystatin powder Commonly known as: MYCOSTATIN/NYSTOP Apply 1 application topically 3 (three) times daily.   Vitamin D (Ergocalciferol) 1.25 MG (50000 UNIT)  Caps capsule Commonly known as: DRISDOL Take 1 capsule (50,000 Units total) by mouth every 7 (seven) days.        Follow-up Information     Just, Laurita Quint, FNP. Call in 1 week(s).   Specialty: Family Medicine Contact information: [place] [address] [phone]                Allergies  Allergen Reactions   Pramipexole Shortness Of Breath    You were cared for by a hospitalist during your hospital stay. If you have any questions about your discharge medications or the care you received while you were in the hospital after you are discharged, you can call the unit and asked to speak with the hospitalist on call if the hospitalist that took care of you is not available. Once you are discharged, your primary care physician will handle any further medical issues.  Please note that no refills for any discharge medications will be authorized once you are discharged, as it is imperative that you return to your primary care physician (or establish a relationship with a primary care physician if you do not have one) for your aftercare needs so that they can reassess your need for medications and monitor your lab values.   Procedures/Studies: CT ABDOMEN PELVIS W CONTRAST  Result Date: 02/15/2021 CLINICAL DATA:  Abdominal pain and vomiting. EXAM: CT ABDOMEN AND PELVIS WITH CONTRAST TECHNIQUE: Multidetector CT imaging of the abdomen and pelvis was performed using the standard protocol following bolus administration of intravenous contrast. CONTRAST:  129mL OMNIPAQUE IOHEXOL 300 MG/ML  SOLN COMPARISON:  September 17, 2003 FINDINGS: Lower chest: No acute abnormality. Hepatobiliary: No focal liver abnormality is seen. Subcentimeter gallstones are seen within the gallbladder lumen without evidence of gallbladder wall thickening or biliary dilatation. Pancreas: Unremarkable. No pancreatic ductal dilatation or surrounding inflammatory changes. Spleen: Normal in size without focal  abnormality. Adrenals/Urinary Tract: Adrenal glands are unremarkable. Kidneys are normal, without renal calculi, focal lesion, or hydronephrosis. Bladder is unremarkable. Stomach/Bowel: Stomach is within normal limits. Appendix appears normal. No evidence of bowel wall thickening, distention, or inflammatory changes. Vascular/Lymphatic: No significant vascular findings are present. No enlarged abdominal or pelvic lymph nodes. Reproductive: A 2.1 cm x 2.0 cm heterogeneous uterine fibroid is suspected on the right. The bilateral adnexa are unremarkable. Other: No abdominal wall hernia or abnormality. No abdominopelvic ascites. Musculoskeletal: No acute or significant osseous findings. IMPRESSION: 1. Cholelithiasis. 2. Suspected uterine fibroid. Correlation with nonemergent pelvic ultrasound is recommended. Electronically Signed   By: Virgina Norfolk M.D.   On: 02/15/2021 20:58   US Abdomen Limited RUQ (LIVER/GB)  Result Date: 02/16/2021 CLINICAL DATA:  Cholelithiasis. EXAM: ULTRASOUND ABDOMEN LIMITED RIGHT UPPER QUADRANT COMPARISON:  February 15, 2021. FINDINGS: Gallbladder: No gallstones or wall thickening visualized. No sonographic Murphy sign noted by sonographer. Probable sludge seen within gallbladder lumen. Common bile duct: Diameter: 5 mm which is within normal limits. Liver: No focal lesion identified. Within normal limits in parenchymal echogenicity. Portal vein is patent on color Doppler imaging with normal direction of blood flow towards the liver. Other: None. IMPRESSION: Probable sludge seen within gallbladder lumen. No other abnormality seen in the right upper quadrant of the abdomen. Electronically Signed   By: Marijo Conception M.D.   On: 02/16/2021 12:42     The results of significant diagnostics from this hospitalization (including imaging, microbiology, ancillary and laboratory) are listed below for reference.     Microbiology: Recent Results (from the past 240 hour(s))  Resp Panel by RT-PCR  (Flu A&B, Covid) Nasopharyngeal Swab     Status: None   Collection Time: 02/15/21  8:14 PM   Specimen: Nasopharyngeal Swab; Nasopharyngeal(NP) swabs in vial transport medium  Result Value Ref Range Status   SARS Coronavirus 2 by RT PCR NEGATIVE NEGATIVE Final    Comment: (NOTE) SARS-CoV-2 target nucleic acids are NOT DETECTED.  The SARS-CoV-2 RNA is generally detectable in upper respiratory specimens during the acute phase of infection. The lowest concentration of SARS-CoV-2 viral copies this assay can detect is 138 copies/mL. A negative result does not preclude SARS-Cov-2 infection and should not be used as the sole basis for treatment or other patient management decisions. A negative result may occur with  improper specimen collection/handling, submission of specimen other than nasopharyngeal swab, presence of viral mutation(s) within the areas targeted by this assay, and inadequate number of viral copies(<138 copies/mL).  A negative result must be combined with clinical observations, patient history, and epidemiological information. The expected result is Negative.  Fact Sheet for Patients:  EntrepreneurPulse.com.au  Fact Sheet for Healthcare Providers:  IncredibleEmployment.be  This test is no t yet approved or cleared by the [place] FDA and  has been authorized for detection and/or diagnosis of SARS-CoV-2 by FDA under an Emergency Use Authorization (EUA). This EUA will remain  in effect (meaning this test can be used) for the duration of the COVID-19 declaration under Section 564(b)(1) of the Act, 21 U.S.C.section 360bbb-3(b)(1), unless the authorization is terminated  or revoked sooner.       Influenza A by PCR NEGATIVE NEGATIVE Final   Influenza B by PCR NEGATIVE NEGATIVE Final    Comment: (NOTE) The Xpert Xpress SARS-CoV-2/FLU/RSV plus assay is intended as an aid in the diagnosis of influenza from Nasopharyngeal swab specimens  and should not be used as a sole basis for treatment. Nasal washings and aspirates are unacceptable for Xpert Xpress SARS-CoV-2/FLU/RSV testing.  Fact Sheet for Patients: EntrepreneurPulse.com.au  Fact Sheet for Healthcare Providers: IncredibleEmployment.be  This test is not yet approved or cleared by the [place] FDA and has been authorized for detection and/or diagnosis of SARS-CoV-2 by FDA under an Emergency Use Authorization (EUA). This EUA will remain in effect (meaning this test can be used) for the duration of the COVID-19 declaration under Section 564(b)(1) of the Act, 21 U.S.C. section 360bbb-3(b)(1), unless the authorization is terminated or revoked.  Performed at [facility], [place] [address], [place], [place] [address]      Labs: BNP (last 3 results) No results for input(s): BNP in the last 8760 hours. Basic Metabolic Panel: Recent Labs  Lab 02/15/21 1402 02/16/21 0331 02/17/21 0427  NA 137 140 137  K 3.8 3.6 2.9*  CL 98 105 100  CO2 25 26 29   GLUCOSE 475* 187* 88  BUN 13 10 10   CREATININE 0.82 0.65 0.64  CALCIUM 9.9 9.4 9.0  MG  --   --  1.8   Liver Function Tests: Recent Labs  Lab 02/16/21 0926  AST 18  ALT 26  ALKPHOS 54  BILITOT 0.6  PROT 7.6  ALBUMIN 4.2   Recent Labs  Lab 02/15/21 2006  LIPASE 22   No results for input(s): AMMONIA in the last 168 hours. CBC: Recent Labs  Lab 02/15/21 1402 02/17/21 0427  WBC 7.3 11.9*  HGB 13.1 11.9*  HCT 39.4 35.8*  MCV 85.3 85.9  PLT 300 282   Cardiac Enzymes: No results for input(s): CKTOTAL, CKMB, CKMBINDEX, TROPONINI in the last 168 hours. BNP: Invalid input(s): POCBNP CBG: Recent Labs  Lab 02/16/21 1137 02/16/21 1803 02/16/21 2110 02/17/21 0005 02/17/21 0536  GLUCAP 127* 193* 110* 104* 85   D-Dimer No results for input(s): DDIMER in the last 72 hours. Hgb A1c Recent Labs    02/15/21 2006  HGBA1C 12.8*   Lipid  Profile No results for input(s): CHOL, HDL, LDLCALC, TRIG, CHOLHDL, LDLDIRECT in the last 72 hours. Thyroid function studies No results for input(s): TSH, T4TOTAL, T3FREE, THYROIDAB in the last 72 hours.  Invalid input(s): FREET3 Anemia work up No results for input(s): VITAMINB12, FOLATE, FERRITIN, TIBC, IRON, RETICCTPCT in the last 72 hours. Urinalysis    Component Value Date/Time   COLORURINE STRAW (A) 02/15/2021 [place] 02/15/2021 1704   LABSPEC 1.024 02/15/2021 1704   PHURINE 6.0 02/15/2021 1704   GLUCOSEU >=500 (A) 02/15/2021 1704  HGBUR MODERATE (A) 02/15/2021 1704   BILIRUBINUR NEGATIVE 02/15/2021 1704   BILIRUBINUR negative 11/01/2020 1405   BILIRUBINUR Negative 05/04/2018 0838   KETONESUR 80 (A) 02/15/2021 1704   PROTEINUR NEGATIVE 02/15/2021 1704   UROBILINOGEN 0.2 11/01/2020 1405   UROBILINOGEN 0.2 05/05/2017 1824   NITRITE NEGATIVE 02/15/2021 1704   LEUKOCYTESUR NEGATIVE 02/15/2021 1704   Sepsis Labs Invalid input(s): PROCALCITONIN,  WBC,  LACTICIDVEN Microbiology Recent Results (from the past 240 hour(s))  Resp Panel by RT-PCR (Flu A&B, Covid) Nasopharyngeal Swab     Status: None   Collection Time: 02/15/21  8:14 PM   Specimen: Nasopharyngeal Swab; Nasopharyngeal(NP) swabs in vial transport medium  Result Value Ref Range Status   SARS Coronavirus 2 by RT PCR NEGATIVE NEGATIVE Final    Comment: (NOTE) SARS-CoV-2 target nucleic acids are NOT DETECTED.  The SARS-CoV-2 RNA is generally detectable in upper respiratory specimens during the acute phase of infection. The lowest concentration of SARS-CoV-2 viral copies this assay can detect is 138 copies/mL. A negative result does not preclude SARS-Cov-2 infection and should not be used as the sole basis for treatment or other patient management decisions. A negative result may occur with  improper specimen collection/handling, submission of specimen other than nasopharyngeal swab, presence of  viral mutation(s) within the areas targeted by this assay, and inadequate number of viral copies(<138 copies/mL). A negative result must be combined with clinical observations, patient history, and epidemiological information. The expected result is Negative.  Fact Sheet for Patients:  EntrepreneurPulse.com.au  Fact Sheet for Healthcare Providers:  IncredibleEmployment.be  This test is no t yet approved or cleared by the [place] FDA and  has been authorized for detection and/or diagnosis of SARS-CoV-2 by FDA under an Emergency Use Authorization (EUA). This EUA will remain  in effect (meaning this test can be used) for the duration of the COVID-19 declaration under Section 564(b)(1) of the Act, 21 U.S.C.section 360bbb-3(b)(1), unless the authorization is terminated  or revoked sooner.       Influenza A by PCR NEGATIVE NEGATIVE Final   Influenza B by PCR NEGATIVE NEGATIVE Final    Comment: (NOTE) The Xpert Xpress SARS-CoV-2/FLU/RSV plus assay is intended as an aid in the diagnosis of influenza from Nasopharyngeal swab specimens and should not be used as a sole basis for treatment. Nasal washings and aspirates are unacceptable for Xpert Xpress SARS-CoV-2/FLU/RSV testing.  Fact Sheet for Patients: EntrepreneurPulse.com.au  Fact Sheet for Healthcare Providers: IncredibleEmployment.be  This test is not yet approved or cleared by the [place] FDA and has been authorized for detection and/or diagnosis of SARS-CoV-2 by FDA under an Emergency Use Authorization (EUA). This EUA will remain in effect (meaning this test can be used) for the duration of the COVID-19 declaration under Section 564(b)(1) of the Act, 21 U.S.C. section 360bbb-3(b)(1), unless the authorization is terminated or revoked.  Performed at [facility], [place] [address] [place], [place] [address]      Time  coordinating discharge:  I have spent 35 minutes face to face with the patient and on the ward discussing the patients care, assessment, plan and disposition with other care givers. >50% of the time was devoted counseling the patient about the risks and benefits of treatment/Discharge disposition and coordinating care.   SIGNED:   Damita Lack, MD  [facility] 02/17/2021, 9:54 AM   If 7PM-7AM, please contact night-coverage

## 2021-02-17 NOTE — Plan of Care (Signed)

## 2021-03-08 ENCOUNTER — Ambulatory Visit (HOSPITAL_COMMUNITY)
Admission: EM | Admit: 2021-03-08 | Discharge: 2021-03-08 | Disposition: A | Discharge status: Home / Self Care | Payer: BC Managed Care – PPO | Attending: Emergency Medicine | Admitting: Emergency Medicine

## 2021-03-08 ENCOUNTER — Other Ambulatory Visit: Payer: Self-pay

## 2021-03-08 ENCOUNTER — Encounter (HOSPITAL_COMMUNITY): Payer: Self-pay

## 2021-03-08 DIAGNOSIS — Z113 Encounter for screening for infections with a predominantly sexual mode of transmission: Secondary | ICD-10-CM | POA: Insufficient documentation

## 2021-03-08 DIAGNOSIS — Z7251 High risk heterosexual behavior: Secondary | ICD-10-CM | POA: Diagnosis not present

## 2021-03-08 DIAGNOSIS — B373 Candidiasis of vulva and vagina: Secondary | ICD-10-CM | POA: Insufficient documentation

## 2021-03-08 DIAGNOSIS — B3731 Acute candidiasis of vulva and vagina: Secondary | ICD-10-CM

## 2021-03-08 MED ORDER — FLUCONAZOLE 150 MG PO TABS: 150.0000 mg | ORAL_TABLET | Freq: Every day | ORAL | 0 refills | Status: DC

## 2021-03-08 MED ORDER — FLUCONAZOLE 150 MG PO TABS: 150.0000 mg | ORAL_TABLET | ORAL | 0 refills | Status: AC

## 2021-03-08 NOTE — ED Triage Notes (Signed)
Pt presents with c/o vaginal bumps. States the bumps are white and states she has had BV before. States the bumps do not sting or itch.   States she recently had a wax for the first time.

## 2021-03-08 NOTE — Discharge Instructions (Addendum)
Take diflucan every 3 days for the next 2 weeks   Watch area to ensure that it is clearing up   Monitor sugars and make sure they are under control

## 2021-03-08 NOTE — ED Provider Notes (Signed)
[place]    CSN: [id number] Arrival date & time: 03/08/21  1510      History   Chief Complaint No chief complaint on file.   HPI [name] is a 35 y.o. female.   Patient presents with vaginal bumps near vaginal opening for 2 days. Bumps are [name]. Denies discharge, itching, odor, frequency,  urgency, abdominal pain, flank pain, hematuria. Sexually active, one partner, no condom use. History of BV and type 2 DM, uncontrolled but working with MD.   Past Medical History:  Diagnosis Date   Chronic painful diabetic neuropathy ([place])    Diabetes mellitus    Type 1   History of chicken pox    Neuropathy 07/2012    Patient Active Problem List   Diagnosis Date Noted   Uncontrolled diabetes mellitus with hyperglycemia ([place]) 02/15/2021   Abdominal pain 02/15/2021   Nausea and vomiting 02/15/2021   Diabetic polyneuropathy associated with type 1 diabetes mellitus ([place]) 03/14/2019   Non compliance with medical treatment 03/14/2019   DKA (diabetic ketoacidoses) 07/21/2018   DKA, type 1 ([place]) 07/21/2018   Diabetic peripheral neuropathy associated with type 1 diabetes mellitus ([place]) 11/09/2012   Uncontrolled type 1 diabetes mellitus with diabetic neuropathy, with long-term current use of insulin ([place]) 07/03/2011    Class: Acute    Past Surgical History:  Procedure Laterality Date   CESAREAN SECTION     only one C/S per pt.   DILATION AND CURETTAGE OF UTERUS      OB History     Gravida  4   Para  2   Term  2   Preterm      AB  2   Living  2      SAB      IAB  2   Ectopic      Multiple      Live Births               Home Medications    Prior to Admission medications   Medication Sig Start Date End Date Taking? Authorizing Provider  clobetasol (TEMOVATE) 0.05 % GEL Apply 1 application topically 2 (two) times daily. Patient not taking: Reported on 02/15/2021 08/29/20   Just, Laurita Quint, FNP  Continuous Blood Gluc Receiver  (FREESTYLE LIBRE 14 DAY READER) DEVI Apply 1 Device topically continuous. E10.65 10/27/19   Forrest Moron, MD  Continuous Blood Gluc Sensor (FREESTYLE LIBRE 14 DAY SENSOR) MISC Apply 1 Device topically every 14 (fourteen) days. E10.65 10/27/19   Forrest Moron, MD  fluconazole (DIFLUCAN) 150 MG tablet Take 1 tablet (150 mg total) by mouth every 3 (three) days for 15 days. 03/08/21 03/23/21  Kordae Buonocore, Leitha Schuller, NP  GRALISE 600 MG TABS Take 600 mg by mouth in the morning, at noon, and at bedtime. 03/30/20   [provider]  hydrOXYzine (ATARAX/VISTARIL) 25 MG tablet Take 1 tablet (25 mg total) by mouth every 6 (six) hours as needed for anxiety. 04/23/20   Tacy Learn, PA-C  insulin aspart (NOVOLOG FLEXPEN) 100 UNIT/ML FlexPen Inject 15-20 Units into the skin 3 (three) times daily with meals. plus sliding scale 11/01/20   Just, Laurita Quint, FNP  insulin detemir (LEVEMIR) 100 UNIT/ML injection Inject 0.6 mLs (60 Units total) into the skin at bedtime. Fasting BG goal 100-140 Increase your levemir by 2 every 3 days if not in that range 02/17/21   Amin, Jeanella Flattery, MD  Insulin Pen Needle (B-D UF III MINI PEN  NEEDLES) 31G X 5 MM MISC To use with Insulin pens, as directed 07/06/20   Just, Laurita Quint, FNP  Insulin Pen Needle 31G X 5 MM MISC To use with insulin 07/06/20   Just, Laurita Quint, FNP  Insulin Syringe-Needle U-100 (INSULIN SYRINGE 1CC/31GX5/16") 31G X 5/16" 1 ML MISC To use with insulin 07/06/20   Just, Laurita Quint, FNP  metoCLOPramide (REGLAN) 10 MG tablet Take 1 tablet (10 mg total) by mouth every 8 (eight) hours as needed for up to 10 days for nausea. 02/17/21 02/27/21  Amin, Jeanella Flattery, MD  nystatin (MYCOSTATIN/NYSTOP) powder Apply 1 application topically 3 (three) times daily. Patient not taking: Reported on 02/15/2021 08/29/20   Just, Laurita Quint, FNP  ondansetron (ZOFRAN ODT) 4 MG disintegrating tablet Take 1 tablet (4 mg total) by mouth every 8 (eight) hours as needed for nausea or vomiting. 02/17/21    Amin, Jeanella Flattery, MD  pantoprazole (PROTONIX) 40 MG tablet Take 1 tablet (40 mg total) by mouth daily before breakfast. 02/17/21   Amin, Jeanella Flattery, MD  Vitamin D, Ergocalciferol, (DRISDOL) 1.25 MG (50000 UNIT) CAPS capsule Take 1 capsule (50,000 Units total) by mouth every 7 (seven) days. Patient not taking: Reported on 02/15/2021 08/29/20   Just, Laurita Quint, FNP    Family History Family History  Problem Relation Age of Onset   Hypertension Mother    Neuropathy Neg Hx     Social History Social History   Tobacco Use   Smoking status: Never   Smokeless tobacco: Never  Vaping Use   Vaping Use: Never used  Substance Use Topics   Alcohol use: No   Drug use: Yes    Types: Marijuana     Allergies   Pramipexole   Review of Systems Review of Systems  Constitutional: Negative.   Respiratory: Negative.    Genitourinary:  Positive for genital sores. Negative for decreased urine volume, difficulty urinating, dyspareunia, dysuria, enuresis, flank pain, frequency, hematuria, menstrual problem, pelvic pain, urgency, vaginal bleeding, vaginal discharge and vaginal pain.  Skin: Negative.     Physical Exam Triage Vital Signs ED Triage Vitals  Enc Vitals Group     BP 03/08/21 1648 135/78     Pulse Rate 03/08/21 1648 85     Resp 03/08/21 1648 18     Temp 03/08/21 1648 98.3 F (36.8 C)     Temp Source 03/08/21 1648 Oral     SpO2 03/08/21 1648 98 %     Weight --      Height --      Head Circumference --      Peak Flow --      Pain Score 03/08/21 1658 0     Pain Loc --      Pain Edu? --      Excl. in [place]? --    No data found.  Updated Vital Signs BP 135/78   Pulse 85   Temp 98.3 F (36.8 C) (Oral)   Resp 18   LMP 02/19/2021 (Exact Date)   SpO2 98%   Visual Acuity Right Eye Distance:   Left Eye Distance:   Bilateral Distance:    Right Eye Near:   Left Eye Near:    Bilateral Near:     Physical Exam Constitutional:      Appearance: Normal appearance. She is normal  weight.  HENT:     Head: Normocephalic.  Eyes:     Extraocular Movements: Extraocular movements intact.  Pulmonary:     Effort:  Pulmonary effort is normal.  Genitourinary:      Comments: Copious [name] curd like discharge at back of vaginal opening, small amount of firm build up of discharge present along right aside  Skin:    General: Skin is warm and dry.  Neurological:     General: No focal deficit present.     Mental Status: She is alert and oriented to person, place, and time. Mental status is at baseline.  Psychiatric:        Mood and Affect: Mood normal.        Behavior: Behavior normal.     UC Treatments / Results  Labs (all labs ordered are listed, but only abnormal results are displayed) Labs Reviewed  CERVICOVAGINAL ANCILLARY ONLY    EKG   Radiology No results found.  Procedures Procedures (including critical care time)  Medications Ordered in UC Medications - No data to display  Initial Impression / Assessment and Plan / UC Course  I have reviewed the triage vital signs and the nursing notes.  Pertinent labs & imaging results that were available during my care of the patient were reviewed by me and considered in my medical decision making (see chart for details).    Vaginal yeast   Diflucan 150 mg every 72 hours for 14 days Monitor blood sugar and continue to control DM  Monitor vaginal to ensure this clearing up  Final Clinical Impressions(s) / UC Diagnoses   Final diagnoses:  Vaginal yeast infection     Discharge Instructions      Take diflucan every 3 days for the next 2 weeks   Watch area to ensure that it is clearing up   Monitor sugars and make sure they are under control   ED Prescriptions     Medication Sig Dispense Auth. Provider   fluconazole (DIFLUCAN) 150 MG tablet  (Status: Discontinued) Take 1 tablet (150 mg total) by mouth daily for 5 doses. Take every 3 days for 2 weeks 5 tablet Marilou Barnfield R, NP   fluconazole  (DIFLUCAN) 150 MG tablet Take 1 tablet (150 mg total) by mouth every 3 (three) days for 15 days. 5 tablet Hans Eden, NP      PDMP not reviewed this encounter.   Hans Eden, NP 03/08/21 1753

## 2021-03-09 LAB — CERVICOVAGINAL ANCILLARY ONLY
Bacterial Vaginitis (gardnerella): NEGATIVE
Candida Glabrata: NEGATIVE
Candida Vaginitis: POSITIVE — AB
Chlamydia: NEGATIVE
Comment: NEGATIVE
Comment: NEGATIVE
Comment: NEGATIVE
Comment: NEGATIVE
Comment: NEGATIVE
Comment: NORMAL
Neisseria Gonorrhea: NEGATIVE
Trichomonas: NEGATIVE

## 2021-04-03 ENCOUNTER — Encounter (HOSPITAL_COMMUNITY): Payer: Self-pay

## 2021-04-03 ENCOUNTER — Other Ambulatory Visit: Payer: Self-pay

## 2021-04-03 ENCOUNTER — Emergency Department (HOSPITAL_COMMUNITY)
Admission: EM | Admit: 2021-04-03 | Discharge: 2021-04-04 | Disposition: A | Discharge status: Home / Self Care | Payer: BC Managed Care – PPO | Attending: Emergency Medicine | Admitting: Emergency Medicine

## 2021-04-03 DIAGNOSIS — M79671 Pain in right foot: Secondary | ICD-10-CM | POA: Diagnosis present

## 2021-04-03 DIAGNOSIS — G6289 Other specified polyneuropathies: Secondary | ICD-10-CM

## 2021-04-03 DIAGNOSIS — E1010 Type 1 diabetes mellitus with ketoacidosis without coma: Secondary | ICD-10-CM | POA: Insufficient documentation

## 2021-04-03 DIAGNOSIS — E1042 Type 1 diabetes mellitus with diabetic polyneuropathy: Secondary | ICD-10-CM | POA: Insufficient documentation

## 2021-04-03 MED ORDER — OXYCODONE-ACETAMINOPHEN 5-325 MG PO TABS
1.0000 | ORAL_TABLET | Freq: Once | ORAL | Status: AC
  Administered 2021-04-03: 1 via ORAL
  Filled 2021-04-03: qty 1

## 2021-04-03 NOTE — ED Provider Notes (Signed)
Emergency Medicine Provider Triage Evaluation Note  [name] , a 35 y.o. female  was evaluated in triage.  Pt complains of foot pain from neuropathy.  Pt is out of pain medication   Review of Systems  Positive: Tingling in feet Negative: No fever   Physical Exam  BP (!) 122/92 (BP Location: Right Arm)   Pulse (!) 119   Temp 98.4 F (36.9 C) (Oral)   Resp 19   LMP 04/03/2021   SpO2 100%  Gen:   Awake, no distress   Resp:  Normal effort  MSK:   Moves extremities without difficulty  Other:    Medical Decision Making  Medically screening exam initiated at 4:38 PM.  Appropriate orders placed.  [name] was informed that the remainder of the evaluation will be completed by another provider, this initial triage assessment does not replace that evaluation, and the importance of remaining in the ED until their evaluation is complete.     Fransico Meadow, PA-C 04/03/21 1639    Jeanell Sparrow, DO 04/03/21 1640

## 2021-04-03 NOTE — ED Triage Notes (Signed)
Pt reports neuropathy in both her feet, usually takes oxycodone for pain but cannot see her pain doctor until beginning of September. Pt anxious in triage. States she takes insulin for her diabetes and has been compliant.

## 2021-04-04 MED ORDER — MELOXICAM 15 MG PO TABS: 15.0000 mg | ORAL_TABLET | Freq: Every day | ORAL | 0 refills | Status: DC

## 2021-04-04 MED ORDER — IBUPROFEN 800 MG PO TABS
800.0000 mg | ORAL_TABLET | Freq: Once | ORAL | Status: AC
  Administered 2021-04-04: 800 mg via ORAL
  Filled 2021-04-04: qty 1

## 2021-04-04 NOTE — Discharge Instructions (Addendum)
Continue taking home medications as prescribed.  Take mobic daily with meals. You may supplement with tylenol as needed.  Call your pain doctor for a sooner appointment.  Call your primary care doctor as needed for further pain control.  Return to the ER with any new, worsening, or concerning symptoms.

## 2021-04-04 NOTE — ED Provider Notes (Signed)
[facility] EMERGENCY DEPARTMENT Provider Note   CSN: [id number] Arrival date & time: 04/03/21  1611     History Chief Complaint  Patient presents with   Peripheral Neuropathy    Caitlyn L McAdoo-Collier is a 35 y.o. female presenting for bilateral foot pain.  Patient states she is having bilateral foot pain consistent with her peripheral neuropathy.  She is on pain medicine for this, prescribed by her pain doctor.  She was unable to make last month's appointment due to not being able to afford the co-pay.  As such, her prescription was not refilled for this month.  Her next appointment is not until September 1.  Patient states she felt she could manage the pain at home, but is unable to do so.  She reports severe pain.  No fall, trauma, or injury.  No fevers or chills.  She has a history of diabetes which is well controlled, no other medical problems.  No calf pain or swelling.  She was given medication in triage, reports significant improvement of pain.  She has tried other medications including gabapentin, Lyrica, Cymbalta.  HPI     Past Medical History:  Diagnosis Date   Chronic painful diabetic neuropathy ([place])    Diabetes mellitus    Type 1   History of chicken pox    Neuropathy 07/2012    Patient Active Problem List   Diagnosis Date Noted   Uncontrolled diabetes mellitus with hyperglycemia ([place]) 02/15/2021   Abdominal pain 02/15/2021   Nausea and vomiting 02/15/2021   Diabetic polyneuropathy associated with type 1 diabetes mellitus ([place]) 03/14/2019   Non compliance with medical treatment 03/14/2019   DKA (diabetic ketoacidoses) 07/21/2018   DKA, type 1 ([place]) 07/21/2018   Diabetic peripheral neuropathy associated with type 1 diabetes mellitus ([place]) 11/09/2012   Uncontrolled type 1 diabetes mellitus with diabetic neuropathy, with long-term current use of insulin ([place]) 07/03/2011    Class: Acute    Past Surgical History:  Procedure Laterality Date    CESAREAN SECTION     only one C/S per pt.   DILATION AND CURETTAGE OF UTERUS       OB History     Gravida  4   Para  2   Term  2   Preterm      AB  2   Living  2      SAB      IAB  2   Ectopic      Multiple      Live Births              Family History  Problem Relation Age of Onset   Hypertension Mother    Neuropathy Neg Hx     Social History   Tobacco Use   Smoking status: Never   Smokeless tobacco: Never  Vaping Use   Vaping Use: Never used  Substance Use Topics   Alcohol use: No   Drug use: Yes    Types: Marijuana    Home Medications Prior to Admission medications   Medication Sig Start Date End Date Taking? Authorizing Provider  meloxicam (MOBIC) 15 MG tablet Take 1 tablet (15 mg total) by mouth daily. 04/04/21  Yes Levander Katzenstein, PA-C  clobetasol (TEMOVATE) 0.05 % GEL Apply 1 application topically 2 (two) times daily. Patient not taking: Reported on 02/15/2021 08/29/20   Just, Laurita Quint, FNP  Continuous Blood Gluc Receiver (FREESTYLE LIBRE 14 DAY READER) DEVI Apply 1 Device topically continuous. E10.65 10/27/19  Delia Chimes A, MD  Continuous Blood Gluc Sensor (FREESTYLE LIBRE 14 DAY SENSOR) MISC Apply 1 Device topically every 14 (fourteen) days. E10.65 10/27/19   Forrest Moron, MD  GRALISE 600 MG TABS Take 600 mg by mouth in the morning, at noon, and at bedtime. 03/30/20   [provider]  hydrOXYzine (ATARAX/VISTARIL) 25 MG tablet Take 1 tablet (25 mg total) by mouth every 6 (six) hours as needed for anxiety. 04/23/20   Tacy Learn, PA-C  insulin aspart (NOVOLOG FLEXPEN) 100 UNIT/ML FlexPen Inject 15-20 Units into the skin 3 (three) times daily with meals. plus sliding scale 11/01/20   Just, Laurita Quint, FNP  insulin detemir (LEVEMIR) 100 UNIT/ML injection Inject 0.6 mLs (60 Units total) into the skin at bedtime. Fasting BG goal 100-140 Increase your levemir by 2 every 3 days if not in that range 02/17/21   Amin, Jeanella Flattery, MD   Insulin Pen Needle (B-D UF III MINI PEN NEEDLES) 31G X 5 MM MISC To use with Insulin pens, as directed 07/06/20   Just, Laurita Quint, FNP  Insulin Pen Needle 31G X 5 MM MISC To use with insulin 07/06/20   Just, Laurita Quint, FNP  Insulin Syringe-Needle U-100 (INSULIN SYRINGE 1CC/31GX5/16") 31G X 5/16" 1 ML MISC To use with insulin 07/06/20   Just, Laurita Quint, FNP  metoCLOPramide (REGLAN) 10 MG tablet Take 1 tablet (10 mg total) by mouth every 8 (eight) hours as needed for up to 10 days for nausea. 02/17/21 02/27/21  Amin, Jeanella Flattery, MD  nystatin (MYCOSTATIN/NYSTOP) powder Apply 1 application topically 3 (three) times daily. Patient not taking: Reported on 02/15/2021 08/29/20   Just, Laurita Quint, FNP  ondansetron (ZOFRAN ODT) 4 MG disintegrating tablet Take 1 tablet (4 mg total) by mouth every 8 (eight) hours as needed for nausea or vomiting. 02/17/21   Amin, Jeanella Flattery, MD  pantoprazole (PROTONIX) 40 MG tablet Take 1 tablet (40 mg total) by mouth daily before breakfast. 02/17/21   Amin, Jeanella Flattery, MD  Vitamin D, Ergocalciferol, (DRISDOL) 1.25 MG (50000 UNIT) CAPS capsule Take 1 capsule (50,000 Units total) by mouth every 7 (seven) days. Patient not taking: Reported on 02/15/2021 08/29/20   Just, Laurita Quint, FNP    Allergies    Pramipexole  Review of Systems   Review of Systems  Musculoskeletal:  Positive for myalgias.  All other systems reviewed and are negative.  Physical Exam Updated Vital Signs BP 117/84 (BP Location: Right Arm)   Pulse (!) 106   Temp 98.4 F (36.9 C) (Oral)   Resp 20   LMP 04/03/2021   SpO2 97%   Physical Exam Vitals and nursing note reviewed.  Constitutional:      General: She is not in acute distress.    Appearance: Normal appearance.     Comments: In NAD  HENT:     Head: Normocephalic and atraumatic.  Eyes:     Conjunctiva/sclera: Conjunctivae normal.     Pupils: Pupils are equal, round, and reactive to light.  Cardiovascular:     Rate and Rhythm: Normal rate and  regular rhythm.     Pulses: Normal pulses.  Pulmonary:     Effort: Pulmonary effort is normal. No respiratory distress.     Breath sounds: Normal breath sounds. No wheezing.     Comments: Speaking in full sentences.  Clear lung sounds in all fields. Abdominal:     General: There is no distension.     Palpations: Abdomen is soft.  Tenderness: There is no abdominal tenderness.  Musculoskeletal:        General: Normal range of motion.     Cervical back: Normal range of motion and neck supple.     Comments: Pedal pulses 2+ bilaterally.  No edema.  No ttp of the calf or lower legs.  Skin:    General: Skin is warm and dry.     Capillary Refill: Capillary refill takes less than 2 seconds.  Neurological:     Mental Status: She is alert and oriented to person, place, and time.  Psychiatric:        Mood and Affect: Mood and affect normal.        Speech: Speech normal.        Behavior: Behavior normal.    ED Results / Procedures / Treatments   Labs (all labs ordered are listed, but only abnormal results are displayed) Labs Reviewed - No data to display  EKG None  Radiology No results found.  Procedures Procedures   Medications Ordered in ED Medications  oxyCODONE-acetaminophen (PERCOCET/ROXICET) 5-325 MG per tablet 1 tablet (1 tablet Oral Given 04/03/21 1640)  ibuprofen (ADVIL) tablet 800 mg (800 mg Oral Given 04/04/21 0041)    ED Course  I have reviewed the triage vital signs and the nursing notes.  Pertinent labs & imaging results that were available during my care of the patient were reviewed by me and considered in my medical decision making (see chart for details).    MDM Rules/Calculators/A&P                           Patient resenting for evaluation of bilateral leg pain, consistent with her peripheral edema.  On exam, patient appears neurovascularly intact.  Pain is much improved after medication given in triage.  Discussed with patient that in the ER we are  unable to provide pain medication for chronic issues, and doing so could know her pain contract and prevent her from getting her regular medication.  As such, we will give her daily NSAIDs and have her use Tylenol.  Encouraged her to follow-up with her pain doctor and primary care doctor as needed for further evaluation.  At this time, patient appears safe for discharge.  Return precautions given.  Patient states she understands and agrees to plan  Final Clinical Impression(s) / ED Diagnoses Final diagnoses:  Other polyneuropathy    Rx / DC Orders ED Discharge Orders          Ordered    meloxicam (MOBIC) 15 MG tablet  Daily        04/04/21 0011             Franchot Heidelberg, PA-C 04/04/21 [place], [place], DO 04/04/21 [id number]

## 2021-05-03 ENCOUNTER — Ambulatory Visit (HOSPITAL_COMMUNITY)
Admission: EM | Admit: 2021-05-03 | Discharge: 2021-05-03 | Disposition: A | Discharge status: Home / Self Care | Payer: BC Managed Care – PPO | Attending: Emergency Medicine | Admitting: Emergency Medicine

## 2021-05-03 ENCOUNTER — Encounter (HOSPITAL_COMMUNITY): Payer: Self-pay | Admitting: Emergency Medicine

## 2021-05-03 ENCOUNTER — Other Ambulatory Visit: Payer: Self-pay

## 2021-05-03 DIAGNOSIS — B373 Candidiasis of vulva and vagina: Secondary | ICD-10-CM | POA: Insufficient documentation

## 2021-05-03 DIAGNOSIS — B3731 Acute candidiasis of vulva and vagina: Secondary | ICD-10-CM

## 2021-05-03 DIAGNOSIS — R300 Dysuria: Secondary | ICD-10-CM | POA: Diagnosis present

## 2021-05-03 DIAGNOSIS — N898 Other specified noninflammatory disorders of vagina: Secondary | ICD-10-CM | POA: Diagnosis present

## 2021-05-03 LAB — POCT URINALYSIS DIPSTICK, ED / UC
Bilirubin Urine: NEGATIVE
Glucose, UA: >=1000 mg/dL — AB
Ketones, ur: NEGATIVE mg/dL
Leukocytes,Ua: NEGATIVE
Nitrite: POSITIVE — AB
Protein, ur: NEGATIVE mg/dL
Specific Gravity, Urine: 1.01 (ref 1.005–1.030)
Urobilinogen, UA: 0.2 mg/dL (ref 0.0–1.0)
pH: 6 (ref 5.0–8.0)

## 2021-05-03 MED ORDER — CLOTRIMAZOLE-BETAMETHASONE 1-0.05 % EX CREA: TOPICAL_CREAM | CUTANEOUS | 0 refills | Status: DC

## 2021-05-03 MED ORDER — FLUCONAZOLE 150 MG PO TABS: 150.0000 mg | ORAL_TABLET | Freq: Every day | ORAL | 0 refills | Status: DC

## 2021-05-03 MED ORDER — NITROFURANTOIN MONOHYD MACRO 100 MG PO CAPS: 100.0000 mg | ORAL_CAPSULE | Freq: Two times a day (BID) | ORAL | 0 refills | Status: DC

## 2021-05-03 NOTE — Discharge Instructions (Addendum)
Take diflucan every 3 days for 2 weeks then weekly for six months   Apply cream to affected area twice a day for 2 weeks   Take macrobid twice a day for 5 days to cover for urinary infection, if changes need to be made to antibiotic we will call you   Schedule an appointment with gynecology for follow up   Schedule an appointment with endocrinoogy for follow up

## 2021-05-03 NOTE — ED Provider Notes (Signed)
[place]    CSN: [id number] Arrival date & time: 05/03/21  0805      History   Chief Complaint Chief Complaint  Patient presents with   Vaginal Discharge   Urinary Frequency   Urine odor    HPI [name] is a 35 y.o. female.   Patient presents with [employer] firm bumps to vaginal area present for over 1 month.  Endorses urinary frequency, urinary odor and lower abdominal cramping for 1 week.  Has attempted to scrape bumps off vaginal area with no success.  Seen in urgent care on 721/22 for similar symptoms, prescribed Diflucan, completed treatment with no relief.  History of diabetes mellitus type 1, uncontrolled, being followed by primary care doctor, endorses use of insulin daily as prescribed, CBGs remain in between 150s to 220s, has referral to endocrinology but has not been contacted.  Denies itching, vaginal odor, vaginal irritation urgency, hematuria, flank pain.  Sexually active, 1 partner, no condom use.   Past Medical History:  Diagnosis Date   Chronic painful diabetic neuropathy ([place])    Diabetes mellitus    Type 1   History of chicken pox    Neuropathy 07/2012    Patient Active Problem List   Diagnosis Date Noted   Uncontrolled diabetes mellitus with hyperglycemia ([place]) 02/15/2021   Abdominal pain 02/15/2021   Nausea and vomiting 02/15/2021   Diabetic polyneuropathy associated with type 1 diabetes mellitus ([place]) 03/14/2019   Non compliance with medical treatment 03/14/2019   DKA (diabetic ketoacidoses) 07/21/2018   DKA, type 1 ([place]) 07/21/2018   Diabetic peripheral neuropathy associated with type 1 diabetes mellitus ([place]) 11/09/2012   Uncontrolled type 1 diabetes mellitus with diabetic neuropathy, with long-term current use of insulin ([place]) 07/03/2011    Class: Acute    Past Surgical History:  Procedure Laterality Date   CESAREAN SECTION     only one C/S per pt.   DILATION AND CURETTAGE OF UTERUS      OB History     Gravida   4   Para  2   Term  2   Preterm      AB  2   Living  2      SAB      IAB  2   Ectopic      Multiple      Live Births               Home Medications    Prior to Admission medications   Medication Sig Start Date End Date Taking? Authorizing Provider  clotrimazole-betamethasone (LOTRISONE) cream Apply to affected area 2 times daily prn 05/03/21  Yes Dessiree Sze R, NP  fluconazole (DIFLUCAN) 150 MG tablet Take 1 tablet (150 mg total) by mouth daily. 05/03/21  Yes Oveta Idris R, NP  nitrofurantoin, macrocrystal-monohydrate, (MACROBID) 100 MG capsule Take 1 capsule (100 mg total) by mouth 2 (two) times daily. 05/03/21  Yes Indigo Chaddock R, NP  clobetasol (TEMOVATE) 0.05 % GEL Apply 1 application topically 2 (two) times daily. Patient not taking: Reported on 02/15/2021 08/29/20   Just, Laurita Quint, FNP  Continuous Blood Gluc Receiver (FREESTYLE LIBRE 14 DAY READER) DEVI Apply 1 Device topically continuous. E10.65 10/27/19   Forrest Moron, MD  Continuous Blood Gluc Sensor (FREESTYLE LIBRE 14 DAY SENSOR) MISC Apply 1 Device topically every 14 (fourteen) days. E10.65 10/27/19   Stallings, Arlie Solomons, MD  GRALISE 600 MG TABS Take 600 mg by mouth in the morning,  at noon, and at bedtime. 03/30/20   [provider]  hydrOXYzine (ATARAX/VISTARIL) 25 MG tablet Take 1 tablet (25 mg total) by mouth every 6 (six) hours as needed for anxiety. 04/23/20   Tacy Learn, PA-C  insulin aspart (NOVOLOG FLEXPEN) 100 UNIT/ML FlexPen Inject 15-20 Units into the skin 3 (three) times daily with meals. plus sliding scale 11/01/20   Just, Laurita Quint, FNP  insulin detemir (LEVEMIR) 100 UNIT/ML injection Inject 0.6 mLs (60 Units total) into the skin at bedtime. Fasting BG goal 100-140 Increase your levemir by 2 every 3 days if not in that range 02/17/21   Amin, Jeanella Flattery, MD  Insulin Pen Needle (B-D UF III MINI PEN NEEDLES) 31G X 5 MM MISC To use with Insulin pens, as directed 07/06/20   Just, Laurita Quint, FNP  Insulin Pen Needle 31G X 5 MM MISC To use with insulin 07/06/20   Just, Laurita Quint, FNP  Insulin Syringe-Needle U-100 (INSULIN SYRINGE 1CC/31GX5/16") 31G X 5/16" 1 ML MISC To use with insulin 07/06/20   Just, Laurita Quint, FNP  meloxicam (MOBIC) 15 MG tablet Take 1 tablet (15 mg total) by mouth daily. 04/04/21   Caccavale, Sophia, PA-C  metoCLOPramide (REGLAN) 10 MG tablet Take 1 tablet (10 mg total) by mouth every 8 (eight) hours as needed for up to 10 days for nausea. 02/17/21 02/27/21  Amin, Jeanella Flattery, MD  nystatin (MYCOSTATIN/NYSTOP) powder Apply 1 application topically 3 (three) times daily. Patient not taking: Reported on 02/15/2021 08/29/20   Just, Laurita Quint, FNP  ondansetron (ZOFRAN ODT) 4 MG disintegrating tablet Take 1 tablet (4 mg total) by mouth every 8 (eight) hours as needed for nausea or vomiting. 02/17/21   Amin, Jeanella Flattery, MD  pantoprazole (PROTONIX) 40 MG tablet Take 1 tablet (40 mg total) by mouth daily before breakfast. 02/17/21   Amin, Jeanella Flattery, MD  Vitamin D, Ergocalciferol, (DRISDOL) 1.25 MG (50000 UNIT) CAPS capsule Take 1 capsule (50,000 Units total) by mouth every 7 (seven) days. Patient not taking: Reported on 02/15/2021 08/29/20   Just, Laurita Quint, FNP    Family History Family History  Problem Relation Age of Onset   Hypertension Mother    Neuropathy Neg Hx     Social History Social History   Tobacco Use   Smoking status: Never   Smokeless tobacco: Never  Vaping Use   Vaping Use: Never used  Substance Use Topics   Alcohol use: No   Drug use: Yes    Types: Marijuana     Allergies   Pramipexole   Review of Systems Review of Systems Defer to HPI   Physical Exam Triage Vital Signs ED Triage Vitals  Enc Vitals Group     BP 05/03/21 0828 (!) 158/110     Pulse Rate 05/03/21 0828 96     Resp 05/03/21 0828 16     Temp 05/03/21 0828 98.1 F (36.7 C)     Temp Source 05/03/21 0828 Oral     SpO2 05/03/21 0828 98 %     Weight --      Height --       Head Circumference --      Peak Flow --      Pain Score 05/03/21 0826 3     Pain Loc --      Pain Edu? --      Excl. in [place]? --    No data found.  Updated Vital Signs BP (!) 158/110 (BP Location: Right  Arm)   Pulse 96   Temp 98.1 F (36.7 C) (Oral)   Resp 16   LMP 04/01/2021   SpO2 98%   Visual Acuity Right Eye Distance:   Left Eye Distance:   Bilateral Distance:    Right Eye Near:   Left Eye Near:    Bilateral Near:     Physical Exam Constitutional:      Appearance: Normal appearance. She is normal weight.  Eyes:     Extraocular Movements: Extraocular movements intact.  Pulmonary:     Effort: Pulmonary effort is normal.  Genitourinary:    Comments: Copious for thick Berenis Corter discharge present over bilateral labia and clitoris Skin:    General: Skin is warm and dry.  Neurological:     Mental Status: She is alert and oriented to person, place, and time. Mental status is at baseline.  Psychiatric:        Mood and Affect: Mood normal.        Behavior: Behavior normal.     UC Treatments / Results  Labs (all labs ordered are listed, but only abnormal results are displayed) Labs Reviewed  POCT URINALYSIS DIPSTICK, ED / UC - Abnormal; Notable for the following components:      Result Value   Glucose, UA >=1000 (*)    Hgb urine dipstick TRACE (*)    Nitrite POSITIVE (*)    All other components within normal limits  URINE CULTURE  CERVICOVAGINAL ANCILLARY ONLY    EKG   Radiology No results found.  Procedures Procedures (including critical care time)  Medications Ordered in UC Medications - No data to display  Initial Impression / Assessment and Plan / UC Course  I have reviewed the triage vital signs and the nursing notes.  Pertinent labs & imaging results that were available during my care of the patient were reviewed by me and considered in my medical decision making (see chart for details).  Clinical Course as of 05/03/21 0930  Thu May 03, 2021   0845 Glucose, UA(!): >=1000 [AW]    Clinical Course User Index [AW] Hans Eden, NP   General vaginitis  1.  Urinalysis showing glucose greater than 1000 and bacteria, sent for culture, will treat prophylactically based on symptoms  2.  Diflucan 150 mg every day 3 days for 2 weeks then once a week for 6 months 3.  Lotrisone cream twice daily for 2 weeks to affected area 4.  Macrobid 100 mg twice daily for 5 days 5.  Given endocrinology and women's health referral for follow-up Final Clinical Impressions(s) / UC Diagnoses   Final diagnoses:  Candida vaginitis     Discharge Instructions      Take diflucan every 3 days for 2 weeks then weekly for six months   Apply cream to affected area twice a day for 2 weeks   Take macrobid twice a day for 5 days to cover for urinary infection, if changes need to be made to antibiotic we will call you   Schedule an appointment with gynecology for follow up   Schedule an appointment with endocrinoogy for follow up      ED Prescriptions     Medication Sig Dispense Auth. Provider   fluconazole (DIFLUCAN) 150 MG tablet Take 1 tablet (150 mg total) by mouth daily. 40 tablet Liza Czerwinski R, NP   clotrimazole-betamethasone (LOTRISONE) cream Apply to affected area 2 times daily prn 15 g Glen Kesinger R, NP   nitrofurantoin, macrocrystal-monohydrate, (MACROBID) 100 MG  capsule Take 1 capsule (100 mg total) by mouth 2 (two) times daily. 10 capsule Hans Eden, NP      PDMP not reviewed this encounter.   Hans Eden, [place] 05/03/21 [phone]

## 2021-05-03 NOTE — ED Triage Notes (Signed)
Pt presents with vaginal discharge, urinary frequency, urine odor and abdominal cramping. States symptoms have been consistent since visit on 7/21

## 2021-05-04 LAB — CERVICOVAGINAL ANCILLARY ONLY
Bacterial Vaginitis (gardnerella): NEGATIVE
Candida Glabrata: NEGATIVE
Candida Vaginitis: POSITIVE — AB
Chlamydia: NEGATIVE
Comment: NEGATIVE
Comment: NEGATIVE
Comment: NEGATIVE
Comment: NEGATIVE
Comment: NEGATIVE
Comment: NORMAL
Neisseria Gonorrhea: NEGATIVE
Trichomonas: NEGATIVE

## 2021-05-05 LAB — URINE CULTURE: Culture: >=100000 — AB

## 2021-05-07 ENCOUNTER — Telehealth (HOSPITAL_COMMUNITY): Payer: Self-pay | Admitting: Emergency Medicine

## 2021-05-07 MED ORDER — CEPHALEXIN 500 MG PO CAPS: 500.0000 mg | ORAL_CAPSULE | Freq: Two times a day (BID) | ORAL | 0 refills | Status: AC

## 2021-05-07 MED ORDER — FLUCONAZOLE 150 MG PO TABS: 150.0000 mg | ORAL_TABLET | Freq: Every day | ORAL | 0 refills | Status: DC

## 2021-05-07 MED ORDER — FLUCONAZOLE 150 MG PO TABS: 150.0000 mg | ORAL_TABLET | Freq: Every day | ORAL | 0 refills | Status: AC

## 2021-05-07 NOTE — Telephone Encounter (Signed)
Patient called and notified urgent care that insurance would not cover amount of medication prescribed, requesting new prescription.  New prescription given, patient notified to alert urgent care if issues occur with obtaining prescription again.  Has not been able to start treatment due to this.  Discussed administration of medication, verbalized understanding.

## 2021-05-24 ENCOUNTER — Other Ambulatory Visit: Payer: Self-pay | Admitting: Specialist

## 2021-05-24 DIAGNOSIS — M5416 Radiculopathy, lumbar region: Secondary | ICD-10-CM

## 2021-06-09 ENCOUNTER — Ambulatory Visit
Admission: RE | Admit: 2021-06-09 | Discharge: 2021-06-09 | Disposition: A | Discharge status: Home / Self Care | Payer: BC Managed Care – PPO | Source: Ambulatory Visit | Attending: Specialist | Admitting: Specialist

## 2021-06-09 ENCOUNTER — Other Ambulatory Visit: Payer: Self-pay

## 2021-06-09 DIAGNOSIS — M5416 Radiculopathy, lumbar region: Secondary | ICD-10-CM

## 2021-08-10 ENCOUNTER — Encounter: Payer: Self-pay | Admitting: Family

## 2021-08-10 ENCOUNTER — Ambulatory Visit: Payer: BC Managed Care – PPO | Admitting: Family

## 2021-08-10 VITALS — BP 125/86 | HR 88 | Temp 97.6°F | Ht 67.0 in | Wt 174.4 lb

## 2021-08-10 DIAGNOSIS — B3731 Acute candidiasis of vulva and vagina: Secondary | ICD-10-CM | POA: Diagnosis not present

## 2021-08-10 DIAGNOSIS — K5903 Drug induced constipation: Secondary | ICD-10-CM | POA: Diagnosis not present

## 2021-08-10 DIAGNOSIS — E1042 Type 1 diabetes mellitus with diabetic polyneuropathy: Secondary | ICD-10-CM | POA: Diagnosis not present

## 2021-08-10 DIAGNOSIS — E1065 Type 1 diabetes mellitus with hyperglycemia: Secondary | ICD-10-CM

## 2021-08-10 MED ORDER — PREGABALIN 150 MG PO CAPS
150.0000 mg | ORAL_CAPSULE | Freq: Three times a day (TID) | ORAL | 0 refills | Status: DC
Start: 2021-08-10 — End: 2021-09-10

## 2021-08-10 MED ORDER — INSULIN SYRINGES (DISPOSABLE) U-100 1 ML MISC: 1.0000 | Freq: Two times a day (BID) | 3 refills | Status: DC

## 2021-08-10 MED ORDER — LINACLOTIDE 72 MCG PO CAPS: 72.0000 ug | ORAL_CAPSULE | Freq: Every day | ORAL | 5 refills | Status: DC

## 2021-08-10 MED ORDER — PREGABALIN 150 MG PO CAPS
150.0000 mg | ORAL_CAPSULE | Freq: Three times a day (TID) | ORAL | 0 refills | Status: DC
Start: 2021-08-10 — End: 2021-08-10

## 2021-08-10 MED ORDER — NOVOLOG FLEXPEN 100 UNIT/ML ~~LOC~~ SOPN: 15.0000 [IU] | PEN_INJECTOR | Freq: Three times a day (TID) | SUBCUTANEOUS | 5 refills | Status: DC

## 2021-08-10 MED ORDER — FLUCONAZOLE 200 MG PO TABS: 200.0000 mg | ORAL_TABLET | ORAL | 0 refills | Status: DC

## 2021-08-10 MED ORDER — FREESTYLE LIBRE 14 DAY SENSOR MISC: 1.0000 | 6 refills | Status: DC

## 2021-08-10 MED ORDER — GABAPENTIN 300 MG PO CAPS: 300.0000 mg | ORAL_CAPSULE | Freq: Every day | ORAL | 5 refills | Status: DC

## 2021-08-10 NOTE — Patient Instructions (Signed)
Welcome to [facility]! It was a pleasure meeting you today.  As discussed, I have sent all your refills to your pharmacy. I am also sending a referral to Endocrinology, but please schedule a 1 month follow up visit today, as it may take a couple of months to see them.  Increase your Levemir insulin by 2 units daily in the am and pm as discussed to get your blood sugar down to 150 during the day and 100-130 fasting.    PLEASE NOTE:  If you had any LAB tests please let us know if you have not heard back within a few days. You may see your results on MyChart before we have a chance to review them but we will give you a call once they are reviewed by [place]. If we ordered any REFERRALS today, please let us know if you have not heard from their office within the next week.  Let us know through MyChart if you are needing REFILLS, or have your pharmacy send [place] the request. You can also use MyChart to communicate with me or any office staff.  Please try these tips to maintain a healthy lifestyle:  Eat most of your calories during the day when you are active. Eliminate processed foods including packaged sweets (pies, cakes, cookies), reduce intake of potatoes, white bread, white pasta, and white rice. Look for whole grain options, oat flour or almond flour.  Each meal should contain half fruits/vegetables, one quarter protein, and one quarter carbs (no bigger than a computer mouse).  Cut down on sweet beverages. This includes juice, soda, and sweet tea. Also watch fruit intake, though this is a healthier sweet option, it still contains natural sugar! Limit to 3 servings daily.  Drink at least 1 glass of water with each meal and aim for at least 8 glasses per day  Exercise at least 150 minutes every week.

## 2021-08-10 NOTE — Assessment & Plan Note (Addendum)
Uncontrolled - , per ER and previous PCP notes, pt was referred to ENDO, but pt states she never received a call. BS has been uncontrolled most of the year requiring 2 ER visits and worsening neuropathy & uncontrolled vaginal yeast. Resending ENDO referral, advised pt to continue Novolog insulin tid, increase her Levemir insulin by 2 units bid daily until fasting BS </= 120 & postprandial BS </= 150. Currently running in the 300s most days per pt report. f/u in 1 month unless has ENDO appt.

## 2021-08-10 NOTE — Progress Notes (Signed)
New Patient Office Visit  Subjective:  Patient ID: [name], female    DOB: 1985/10/01  Age: 35 y.o. MRN: [id number]  CC:  Chief Complaint  Patient presents with   Establish Care   Annual Exam   Diabetes    HPI [name] presents for establishing care and to discuss several problems. T1DM: Pt is currently maintained on the following medications for diabetes:Novolog tid and Levemir bid Failed meds include orals. Denies polyuria/polydipsia. Reports uncontrolled polyneuropathy sx and vaginal yeast. Denies hypoglycemia Home glucose readings range >300. Last A1C was  Lab Results  Component Value Date   HGBA1C 12.8 (H) 02/15/2021   HGBA1C 12.3 11/01/2020   HGBA1C 14.0 (A) 08/29/2020  Neuropathy: She describes symptoms of burning and tingling. Onset of symptoms was gradual, starting about 10 years ago. Symptoms are currently of generalized severity. Symptoms occur all day and last hours. The patient denies cramping, hypersensitivity, and allodynia. Symptoms are symmetric. She also describes autonomic symptoms of  constipation. Previous treatment has included Gabapentin, Lyrica, Gralise, OXY, Tizanidine, which have not improved symptoms.  Constipation: Patient complains of constipation. Onset months ago after starting Gabapentin and Oxycodone meds. Stool pattern has been 1 per week, hard/balls, no blood. Straining with bowel movement: yes/. New diet: no. Exercise: no. Proper water intake: no. Vaginitis: Patient complains of an abnormal vaginal discharge for months. Vaginal symptoms include no odor, itching, pain. STI Risk/HX: high. Discharge described as: thick, white, scant Other associated symptoms: no pelvic pain Menstrual pattern: regular Contraception: none   Past Medical History:  Diagnosis Date   History of chicken pox     Past Surgical History:  Procedure Laterality Date   CESAREAN SECTION     only one C/S per pt.   DILATION AND CURETTAGE  OF UTERUS      Family History  Problem Relation Age of Onset   Hypertension Mother    Diabetes Maternal Grandmother    Neuropathy Neg Hx     Social History   Socioeconomic History   Marital status: Married    Spouse name: Not on file   Number of children: 2   Years of education: Not on file   Highest education level: High school graduate  Occupational History   Not on file  Tobacco Use   Smoking status: Never   Smokeless tobacco: Never  Vaping Use   Vaping Use: Never used  Substance and Sexual Activity   Alcohol use: No   Drug use: Yes    Types: Marijuana   Sexual activity: Yes    Birth control/protection: None  Other Topics Concern   Not on file  Social History Narrative   Lives at home with her husband and children   Right handed   Caffeine: tea, 1 cup in the morning.   Social Determinants of Health   Financial Resource Strain: Not on file  Food Insecurity: Not on file  Transportation Needs: Not on file  Physical Activity: Not on file  Stress: Not on file  Social Connections: Not on file  Intimate Partner Violence: Not on file    Objective:   Today's Vitals: BP 125/86    Pulse 88    Temp 97.6 F (36.4 C) (Temporal)    Ht 5\' 7"  (1.702 m) Comment: With shoes   Wt 174 lb 6.4 oz (79.1 kg)    LMP 07/16/2021 (Exact Date)    SpO2 99%    BMI 27.31 kg/m   Physical Exam Vitals and  nursing note reviewed.  Constitutional:      Appearance: Normal appearance.  Cardiovascular:     Rate and Rhythm: Normal rate and regular rhythm.  Pulmonary:     Effort: Pulmonary effort is normal.     Breath sounds: Normal breath sounds.  Musculoskeletal:        General: Normal range of motion.  Skin:    General: Skin is warm and dry.  Neurological:     Mental Status: She is alert.  Psychiatric:        Mood and Affect: Mood normal.        Behavior: Behavior normal.    Assessment & Plan:   Problem List Items Addressed This Visit       Digestive   Drug induced  constipation    Gabapentin tid and hx of OXY, states Miralax, OTC enemas, dulcolax have not worked. Sending Linzess, start low dose, increase to 153mcg qd if needed. f/u 1 month.      Relevant Medications   linaclotide (LINZESS) 72 MCG capsule     Endocrine   Uncontrolled type 1 diabetes mellitus with hyperglycemia ([facility])    Uncontrolled - , per ER and previous PCP notes, pt was referred to ENDO, but pt states she never received a call. BS has been uncontrolled most of the year requiring 2 ER visits and worsening neuropathy & uncontrolled vaginal yeast. Resending ENDO referral, advised pt to continue Novolog insulin tid, increase her Levemir insulin by 2 units bid daily until fasting BS </= 120 & postprandial BS </= 150. Currently running in the 300s most days per pt report. f/u in 1 month unless has ENDO appt.      Relevant Medications   Continuous Blood Gluc Sensor (FREESTYLE LIBRE 14 DAY SENSOR) MISC   insulin aspart (NOVOLOG FLEXPEN) 100 UNIT/ML FlexPen   Insulin Syringes, Disposable, U-100 1 ML MISC   Other Relevant Orders   Ambulatory referral to Endocrinology   Diabetic polyneuropathy associated with type 1 diabetes mellitus ([place]) - Primary    Uncontrolled - reports having unsuccessfully tried ER gabapentin, OXY, tizanidine, Lyrica. Currently taking 300mg  Gabapentin tid which is not working, has not tried increase dose. dose not remember dose of Lyrica or if tried ER dose. Starting 150mg  Lyrica tid, can add 1-2 caps of Gabapentin qhs. Advised pt on importance of lowering BS to reduce sx. f/u 1 month.      Relevant Medications   traZODone (DESYREL) 100 MG tablet   tiZANidine (ZANAFLEX) 4 MG tablet   pregabalin (LYRICA) 150 MG capsule   gabapentin (NEURONTIN) 300 MG capsule (Start on 09/10/2021)   insulin aspart (NOVOLOG FLEXPEN) 100 UNIT/ML FlexPen     Genitourinary   Vaginal yeast infection    Uncontrolled - Chronic- d/t uncontrolled T1DM. reports taking Fluconazole q3d w/o  improvement. states she has white bumps/lesions on vaginal skin & has had skin cultures done indicating yeast. also reports some white, thick d/c, but no itching or irritation. Sending 200mg  Fluconazole q3d x 3, then 1 pill qweekly for 24 weeks or until resolved.      Relevant Medications   fluconazole (DIFLUCAN) 200 MG tablet    Outpatient Encounter Medications as of 08/10/2021  Medication Sig   clindamycin (CLEOCIN) 2 % vaginal cream Place vaginally 2 (two) times daily.   Continuous Blood Gluc Receiver (FREESTYLE LIBRE 14 DAY READER) DEVI Apply 1 Device topically continuous. E10.65   cyanocobalamin (,VITAMIN B-12,) 1000 MCG/ML injection Inject into the muscle.   fluconazole (DIFLUCAN) 200  MG tablet Take 1 tablet (200 mg total) by mouth every 3 (three) days. For 3 doses, then 1 pill WEEKLY until finished.   furosemide (LASIX) 20 MG tablet Take 20 mg by mouth daily.   insulin detemir (LEVEMIR) 100 UNIT/ML injection Inject 0.6 mLs (60 Units total) into the skin at bedtime. Fasting BG goal 100-140 Increase your levemir by 2 every 3 days if not in that range   Insulin Pen Needle (B-D UF III MINI PEN NEEDLES) 31G X 5 MM MISC To use with Insulin pens, as directed   Insulin Syringes, Disposable, U-100 1 ML MISC Inject 1 each into the skin in the morning and at bedtime.   linaclotide (LINZESS) 72 MCG capsule Take 1-2 capsules (72-145 mcg total) by mouth daily before breakfast. For constipation. If no BM in 3 days, increase dose to 2 capsules daily and notify the office.   meloxicam (MOBIC) 15 MG tablet Take 1 tablet (15 mg total) by mouth daily.   Oxycodone HCl 10 MG TABS Take 10 mg by mouth 3 (three) times daily as needed.   tiZANidine (ZANAFLEX) 4 MG tablet SMARTSIG:1-2 Tablet(s) By Mouth Every Evening   traZODone (DESYREL) 100 MG tablet Take 100 mg by mouth at bedtime.   [DISCONTINUED] Continuous Blood Gluc Sensor (FREESTYLE LIBRE 14 DAY SENSOR) MISC Apply 1 Device topically every 14 (fourteen)  days. E10.65   [DISCONTINUED] gabapentin (NEURONTIN) 300 MG capsule Take 300 mg by mouth 3 (three) times daily.   [DISCONTINUED] insulin aspart (NOVOLOG FLEXPEN) 100 UNIT/ML FlexPen Inject 15-20 Units into the skin 3 (three) times daily with meals. plus sliding scale   [DISCONTINUED] Insulin Pen Needle 31G X 5 MM MISC To use with insulin   [DISCONTINUED] Insulin Syringe-Needle U-100 (INSULIN SYRINGE 1CC/31GX5/16") 31G X 5/16" 1 ML MISC To use with insulin   [DISCONTINUED] pregabalin (LYRICA) 150 MG capsule Take 1 capsule (150 mg total) by mouth 3 (three) times daily.   Continuous Blood Gluc Sensor (FREESTYLE LIBRE 14 DAY SENSOR) MISC Apply 1 Device topically every 14 (fourteen) days. E10.65   [START ON 09/10/2021] gabapentin (NEURONTIN) 300 MG capsule Take 1-2 capsules (300-600 mg total) by mouth at bedtime.   insulin aspart (NOVOLOG FLEXPEN) 100 UNIT/ML FlexPen Inject 15-20 Units into the skin 3 (three) times daily with meals. plus sliding scale   pregabalin (LYRICA) 150 MG capsule Take 1 capsule (150 mg total) by mouth 3 (three) times daily.   [DISCONTINUED] clobetasol (TEMOVATE) 0.05 % GEL Apply 1 application topically 2 (two) times daily. (Patient not taking: Reported on 02/15/2021)   [DISCONTINUED] clotrimazole-betamethasone (LOTRISONE) cream Apply to affected area 2 times daily prn (Patient not taking: Reported on 08/10/2021)   [DISCONTINUED] fluconazole (DIFLUCAN) 150 MG tablet Take 1 tablet (150 mg total) by mouth daily. (Patient not taking: Reported on 08/10/2021)   [DISCONTINUED] GRALISE 600 MG TABS Take 600 mg by mouth in the morning, at noon, and at bedtime. (Patient not taking: Reported on 08/10/2021)   [DISCONTINUED] hydrOXYzine (ATARAX/VISTARIL) 25 MG tablet Take 1 tablet (25 mg total) by mouth every 6 (six) hours as needed for anxiety. (Patient not taking: Reported on 08/10/2021)   [DISCONTINUED] metoCLOPramide (REGLAN) 10 MG tablet Take 1 tablet (10 mg total) by mouth every 8 (eight)  hours as needed for up to 10 days for nausea. (Patient not taking: Reported on 08/10/2021)   [DISCONTINUED] nitrofurantoin, macrocrystal-monohydrate, (MACROBID) 100 MG capsule Take 1 capsule (100 mg total) by mouth 2 (two) times daily. (Patient not taking: Reported on 08/10/2021)   [  DISCONTINUED] nystatin (MYCOSTATIN/NYSTOP) powder Apply 1 application topically 3 (three) times daily. (Patient not taking: Reported on 02/15/2021)   [DISCONTINUED] ondansetron (ZOFRAN ODT) 4 MG disintegrating tablet Take 1 tablet (4 mg total) by mouth every 8 (eight) hours as needed for nausea or vomiting. (Patient not taking: Reported on 08/10/2021)   [DISCONTINUED] pantoprazole (PROTONIX) 40 MG tablet Take 1 tablet (40 mg total) by mouth daily before breakfast. (Patient not taking: Reported on 08/10/2021)   [DISCONTINUED] Vitamin D, Ergocalciferol, (DRISDOL) 1.25 MG (50000 UNIT) CAPS capsule Take 1 capsule (50,000 Units total) by mouth every 7 (seven) days. (Patient not taking: Reported on 02/15/2021)   No facility-administered encounter medications on file as of 08/10/2021.   *Extra time (14min) spent with patient today which consisted of chart review (ER & previous PCP notes), discussing diagnoses, work up, treatment, answering questions, and documentation.   Follow-up: Return in about 4 weeks (around 09/07/2021) for diabetes, neuropathy, yeast.   Jeanie Sewer, NP

## 2021-08-10 NOTE — Assessment & Plan Note (Addendum)
Uncontrolled - Chronic- d/t uncontrolled T1DM. reports taking Fluconazole q3d w/o improvement. states she has white bumps/lesions on vaginal skin & has had skin cultures done indicating yeast. also reports some white, thick d/c, but no itching or irritation. Sending 200mg  Fluconazole q3d x 3, then 1 pill qweekly for 24 weeks or until resolved.

## 2021-08-10 NOTE — Assessment & Plan Note (Addendum)
Gabapentin tid and hx of OXY, states Miralax, OTC enemas, dulcolax have not worked. Sending Linzess, start low dose, increase to 142mcg qd if needed. f/u 1 month.

## 2021-08-10 NOTE — Assessment & Plan Note (Addendum)
Uncontrolled - reports having unsuccessfully tried ER gabapentin, OXY, tizanidine, Lyrica. Currently taking 300mg  Gabapentin tid which is not working, has not tried increase dose. dose not remember dose of Lyrica or if tried ER dose. Starting 150mg  Lyrica tid, can add 1-2 caps of Gabapentin qhs. Advised pt on importance of lowering BS to reduce sx. f/u 1 month.

## 2021-09-10 ENCOUNTER — Ambulatory Visit: Payer: BC Managed Care – PPO | Admitting: Family

## 2021-09-10 ENCOUNTER — Encounter: Payer: Self-pay | Admitting: Family

## 2021-09-10 ENCOUNTER — Other Ambulatory Visit: Payer: Self-pay

## 2021-09-10 ENCOUNTER — Other Ambulatory Visit (HOSPITAL_COMMUNITY)
Admission: RE | Admit: 2021-09-10 | Discharge: 2021-09-10 | Disposition: A | Discharge status: Home / Self Care | Payer: BC Managed Care – PPO | Source: Ambulatory Visit | Attending: Family | Admitting: Family

## 2021-09-10 VITALS — BP 91/66 | HR 97 | Temp 97.6°F | Ht 67.0 in | Wt 176.8 lb

## 2021-09-10 DIAGNOSIS — N761 Subacute and chronic vaginitis: Secondary | ICD-10-CM | POA: Insufficient documentation

## 2021-09-10 DIAGNOSIS — R112 Nausea with vomiting, unspecified: Secondary | ICD-10-CM

## 2021-09-10 DIAGNOSIS — A630 Anogenital (venereal) warts: Secondary | ICD-10-CM

## 2021-09-10 DIAGNOSIS — E1065 Type 1 diabetes mellitus with hyperglycemia: Secondary | ICD-10-CM | POA: Diagnosis not present

## 2021-09-10 DIAGNOSIS — E1042 Type 1 diabetes mellitus with diabetic polyneuropathy: Secondary | ICD-10-CM | POA: Diagnosis not present

## 2021-09-10 DIAGNOSIS — K5903 Drug induced constipation: Secondary | ICD-10-CM

## 2021-09-10 LAB — POCT GLYCOSYLATED HEMOGLOBIN (HGB A1C): Hemoglobin A1C: 10.9 % — AB (ref 4.0–5.6)

## 2021-09-10 MED ORDER — INSULIN DETEMIR 100 UNIT/ML ~~LOC~~ SOLN: SUBCUTANEOUS | 2 refills | Status: DC

## 2021-09-10 MED ORDER — PREGABALIN 150 MG PO CAPS: 150.0000 mg | ORAL_CAPSULE | Freq: Three times a day (TID) | ORAL | 2 refills | Status: DC

## 2021-09-10 MED ORDER — ONDANSETRON HCL 4 MG PO TABS: 4.0000 mg | ORAL_TABLET | Freq: Three times a day (TID) | ORAL | 0 refills | Status: DC | PRN

## 2021-09-10 NOTE — Progress Notes (Signed)
Subjective:     Patient ID: [name], female    DOB: [birth year], 36 y.o.   MRN: [id number]  Chief Complaint  Patient presents with   Diabetes    Pt is still experiences burning and tingling. She also states that she would like to try Ozempic or Mounjaro, she is still struggling to lose weight.    Fatigue   Genital Warts    HPI: Constipation: Patient complains of constipation. Onset months ago after starting Gabapentin and Oxycodone meds. Stool pattern has been 1 per week, hard/balls, no blood. Straining with bowel movement: yes/. New diet: no. Exercise: no. Proper water intake: no. Reports no change with starting Linzess qd. Neuropathy: She describes symptoms of burning and tingling. Onset of symptoms was gradual, starting about 10 years ago. Symptoms are currently of generalized severity. Symptoms occur all day and last hours. The patient denies cramping, hypersensitivity, and allodynia. Symptoms are symmetric. She also describes autonomic symptoms of  constipation. Previous treatment has included Gabapentin, Lyrica, Gralise, OXY, Tizanidine, which have not improved symptoms. Reports doing better with Lyrica tid and Gabapentin qhs. T1DM: Pt is currently maintained on the following medications for diabetes:Novolog tid and Levemir qd Failed meds include orals. Denies polyuria/polydipsia. Reports polyneuropathy, fatigue, nausea,  and vaginal yeast. Denies hypoglycemia Home glucose readings range >300. Last A1C was  Lab Results  Component Value Date   HGBA1C 12.8 (H) 02/15/2021   HGBA1C 12.3 11/01/2020   HGBA1C 14.0 (A) 08/29/2020   Skin lesion:  pt reports having extra clump of skin below her vagina. Previously was told this was yeast, has taken month's worth of Diflucan with no change. Denies discharge, itching, or pain.   Health Maintenance Due  Topic Date Due   TETANUS/TDAP  Never done   OPHTHALMOLOGY EXAM  07/19/2020   FOOT EXAM  10/26/2020   URINE MICROALBUMIN   10/26/2020    Past Medical History:  Diagnosis Date   History of chicken pox     Past Surgical History:  Procedure Laterality Date   CESAREAN SECTION     only one C/S per pt.   DILATION AND CURETTAGE OF UTERUS      Outpatient Medications Prior to Visit  Medication Sig Dispense Refill   clindamycin (CLEOCIN) 2 % vaginal cream Place vaginally 2 (two) times daily.     Continuous Blood Gluc Receiver (FREESTYLE LIBRE 14 DAY READER) DEVI Apply 1 Device topically continuous. E10.65 2 each -   Continuous Blood Gluc Sensor (FREESTYLE LIBRE 14 DAY SENSOR) MISC Apply 1 Device topically every 14 (fourteen) days. E10.65 2 each 6   cyanocobalamin (,VITAMIN B-12,) 1000 MCG/ML injection Inject into the muscle.     fluconazole (DIFLUCAN) 200 MG tablet Take 1 tablet (200 mg total) by mouth every 3 (three) days. For 3 doses, then 1 pill WEEKLY until finished. 30 tablet 0   furosemide (LASIX) 20 MG tablet Take 20 mg by mouth daily.     gabapentin (NEURONTIN) 300 MG capsule Take 1-2 capsules (300-600 mg total) by mouth at bedtime. 30 capsule 5   Insulin Pen Needle (B-D UF III MINI PEN NEEDLES) 31G X 5 MM MISC To use with Insulin pens, as directed 90 each 0   Insulin Syringes, Disposable, U-100 1 ML MISC Inject 1 each into the skin in the morning and at bedtime. 100 each 3   linaclotide (LINZESS) 72 MCG capsule Take 1-2 capsules (72-145 mcg total) by mouth daily before breakfast. For constipation. If no BM in  3 days, increase dose to 2 capsules daily and notify the office. 60 capsule 5   meloxicam (MOBIC) 15 MG tablet Take 1 tablet (15 mg total) by mouth daily. 30 tablet 0   Oxycodone HCl 10 MG TABS Take 10 mg by mouth 3 (three) times daily as needed.     tiZANidine (ZANAFLEX) 4 MG tablet SMARTSIG:1-2 Tablet(s) By Mouth Every Evening     traZODone (DESYREL) 100 MG tablet Take 100 mg by mouth at bedtime.     insulin detemir (LEVEMIR) 100 UNIT/ML injection Inject 0.6 mLs (60 Units total) into the skin at  bedtime. Fasting BG goal 100-140 Increase your levemir by 2 every 3 days if not in that range     pregabalin (LYRICA) 150 MG capsule Take 1 capsule (150 mg total) by mouth 3 (three) times daily. 90 capsule 0   BD INSULIN SYRINGE U/F 31G X 5/16" 1 ML MISC SMARTSIG:1 Each SUB-Q Morning-Night     insulin aspart (NOVOLOG FLEXPEN) 100 UNIT/ML FlexPen Inject 15-20 Units into the skin 3 (three) times daily with meals. plus sliding scale 35 mL 5   No facility-administered medications prior to visit.    Allergies  Allergen Reactions   Pramipexole Shortness Of Breath        Objective:    Physical Exam Vitals and nursing note reviewed.  Constitutional:      Appearance: Normal appearance.  Cardiovascular:     Rate and Rhythm: Normal rate and regular rhythm.  Pulmonary:     Effort: Pulmonary effort is normal.     Breath sounds: Normal breath sounds.  Genitourinary:    Labia:        Right: No rash or tenderness.        Left: No rash or tenderness.        Comments: large clump of condylomata-appearing skin folds in anogenital region noted above. Musculoskeletal:        General: Normal range of motion.  Skin:    General: Skin is warm and dry.  Neurological:     Mental Status: She is alert.  Psychiatric:        Mood and Affect: Mood normal.        Behavior: Behavior normal.    BP 91/66    Pulse 97    Temp 97.6 F (36.4 C) (Temporal)    Ht 5\' 7"  (1.702 m)    Wt 176 lb 12.8 oz (80.2 kg)    SpO2 100%    BMI 27.69 kg/m  Wt Readings from Last 3 Encounters:  09/10/21 176 lb 12.8 oz (80.2 kg)  08/10/21 174 lb 6.4 oz (79.1 kg)  02/15/21 160 lb (72.6 kg)       Assessment & Plan:   Problem List Items Addressed This Visit       Digestive   Nausea and vomiting    probably gastroparesis r/t uncontrolled DM. sending zofran.      Relevant Medications   ondansetron (ZOFRAN) 4 MG tablet   Drug induced constipation    Unstable - reports never increasing dose to 2 pills daily as  instructed. Advised to do this, drink at least 64oz water qd and let me know if still not helping.        Endocrine   Uncontrolled type 1 diabetes mellitus with hyperglycemia (HCC) - Primary    POCT A1C 10.9 - home fasting CBG 382, Levimir up to 80 units, and doing the Novolog tid. Advised to increase Levemir to bid, 50u qhs  and 40u qam, still waiting to hear from ENDO referral, pt given phone # to call. Continue monitoring CBGs, if >300 let me know.      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 Appears as large clump of condylmata. pt reports it started months ago, has grown, denies itching or pain and has taken Diflucan without relief. referring to GYN for biopsy to confirm.      Relevant Orders   Ambulatory referral to Obstetrics / Gynecology   Other Visit Diagnoses     Chronic vaginitis       Relevant Orders   Cervicovaginal ancillary only       Meds ordered this encounter  Medications   ondansetron (ZOFRAN) 4 MG tablet    Sig: Take 1 tablet (4 mg total) by mouth every 8 (eight) hours as needed for nausea or vomiting.    Dispense:  30 tablet    Refill:  0    Order Specific Question:   Supervising Provider    Answer:   ANDY, CAMILLE L [[id number]]   pregabalin (LYRICA) 150 MG capsule    Sig: Take 1 capsule (150 mg total) by mouth 3 (three) times daily.    Dispense:  90 capsule    Refill:  2    Order Specific Question:   Supervising Provider    Answer:   ANDY, CAMILLE L [[id number]]   insulin detemir (LEVEMIR) 100 UNIT/ML  injection    Sig: Take 50units qhs, and 40 units qam    Dispense:  10 mL    Refill:  2    Order Specific Question:   Supervising Provider    Answer:   ANDY, CAMILLE L [[id number]]

## 2021-09-10 NOTE — Assessment & Plan Note (Signed)
Unstable - reports never increasing dose to 2 pills daily as instructed. Advised to do this, drink at least 64oz water qd and let me know if still not helping.

## 2021-09-10 NOTE — Assessment & Plan Note (Addendum)
not confirmed, pt reports urgent care sampling and stating it was negative except for yeast. Appears as large clump of condylmata. pt reports it started months ago, has grown, denies itching or pain and has taken Diflucan without relief. referring to GYN for biopsy to confirm.

## 2021-09-10 NOTE — Assessment & Plan Note (Addendum)
POCT A1C 10.9 - home fasting CBG 382, Levimir up to 80 units, and doing the Novolog tid. Advised to increase Levemir to bid, 50u qhs and 40u qam, still waiting to hear from ENDO referral, pt given phone # to call. Continue monitoring CBGs, if >300 let me know.

## 2021-09-10 NOTE — Assessment & Plan Note (Signed)
probably gastroparesis r/t uncontrolled DM. sending zofran.

## 2021-09-10 NOTE — Patient Instructions (Addendum)
It was very nice to see you today!  Increase your Levemir to twice a day. Take 50 units at bedtime and 40 units in the morning. Continue checking your blood sugars and let me know if fasting is still > 300.  I am sending a referral to Gynecology regarding your genital skin condition, Call us back if you have not heard from them in one week.  Please schedule a 3 mo f/u visit today.    PLEASE NOTE:  If you had any lab tests please let us know if you have not heard back within a few days. You may see your results on MyChart before we have a chance to review them but we will give you a call once they are reviewed by [place]. If we ordered any referrals today, please let us know if you have not heard from their office within the next week.   Please try these tips to maintain a healthy lifestyle:  Eat most of your calories during the day when you are active. Eliminate processed foods including packaged sweets (pies, cakes, cookies), reduce intake of potatoes, white bread, white pasta, and white rice. Look for whole grain options, oat flour or almond flour.  Each meal should contain half fruits/vegetables, one quarter protein, and one quarter carbs (no bigger than a computer mouse).  Cut down on sweet beverages. This includes juice, soda, and sweet tea. Also watch fruit intake, though this is a healthier sweet option, it still contains natural sugar! Limit to 3 servings daily.  Drink at least 1 glass of water with each meal and aim for at least 8 glasses per day  Exercise at least 150 minutes every week.

## 2021-09-10 NOTE — Assessment & Plan Note (Addendum)
Chronic - stable on Lyrica tid and Gabapentin qhs

## 2021-09-11 LAB — CERVICOVAGINAL ANCILLARY ONLY
Bacterial Vaginitis (gardnerella): NEGATIVE
Candida Glabrata: NEGATIVE
Candida Vaginitis: NEGATIVE
Chlamydia: NEGATIVE
Comment: NEGATIVE
Comment: NEGATIVE
Comment: NEGATIVE
Comment: NEGATIVE
Comment: NEGATIVE
Comment: NORMAL
Neisseria Gonorrhea: NEGATIVE
Trichomonas: NEGATIVE

## 2021-09-11 NOTE — Progress Notes (Signed)
vaginal swab negative for STDs, yeast and BV  remind her to be looking for call from gynecology referral. thx

## 2021-09-12 NOTE — Progress Notes (Signed)
why I mentioned the gynecology referral, they can biopsy the genital warts, I do not do that, thx

## 2021-09-13 ENCOUNTER — Telehealth: Payer: Self-pay

## 2021-09-13 NOTE — Telephone Encounter (Signed)
Patient is calling back in regard to lab results.  Patient states she is confused.  Would like to know if she has genital warts or not.   Thought she did not have genital warts.    I have given [facility] phone number.   But is requesting call back for clarification.

## 2021-09-13 NOTE — Telephone Encounter (Signed)
I confirmed that all testing here in the office was negative, and that she needs to be evaluated by GYN. Referral was placed and front desk provided number to GYN. Pt was told to allow 7-10 business days for scheduling. Pt verbalized understanding.

## 2021-11-02 ENCOUNTER — Other Ambulatory Visit: Payer: Self-pay | Admitting: Family

## 2021-11-02 DIAGNOSIS — R112 Nausea with vomiting, unspecified: Secondary | ICD-10-CM

## 2021-11-08 ENCOUNTER — Other Ambulatory Visit (HOSPITAL_COMMUNITY)
Admission: RE | Admit: 2021-11-08 | Discharge: 2021-11-08 | Disposition: A | Discharge status: Home / Self Care | Payer: BC Managed Care – PPO | Source: Ambulatory Visit | Attending: Obstetrics and Gynecology | Admitting: Obstetrics and Gynecology

## 2021-11-08 ENCOUNTER — Encounter: Payer: Self-pay | Admitting: Obstetrics and Gynecology

## 2021-11-08 ENCOUNTER — Other Ambulatory Visit: Payer: Self-pay

## 2021-11-08 ENCOUNTER — Ambulatory Visit (INDEPENDENT_AMBULATORY_CARE_PROVIDER_SITE_OTHER): Payer: BC Managed Care – PPO | Admitting: Obstetrics and Gynecology

## 2021-11-08 VITALS — BP 123/89 | HR 101 | Ht 66.0 in | Wt 167.0 lb

## 2021-11-08 DIAGNOSIS — N898 Other specified noninflammatory disorders of vagina: Secondary | ICD-10-CM | POA: Insufficient documentation

## 2021-11-08 DIAGNOSIS — B3731 Acute candidiasis of vulva and vagina: Secondary | ICD-10-CM | POA: Insufficient documentation

## 2021-11-08 DIAGNOSIS — Z01419 Encounter for gynecological examination (general) (routine) without abnormal findings: Secondary | ICD-10-CM

## 2021-11-08 DIAGNOSIS — A630 Anogenital (venereal) warts: Secondary | ICD-10-CM | POA: Insufficient documentation

## 2021-11-08 DIAGNOSIS — R300 Dysuria: Secondary | ICD-10-CM

## 2021-11-08 DIAGNOSIS — K5909 Other constipation: Secondary | ICD-10-CM | POA: Insufficient documentation

## 2021-11-08 DIAGNOSIS — D071 Carcinoma in situ of vulva: Secondary | ICD-10-CM | POA: Diagnosis not present

## 2021-11-08 HISTORY — DX: Other constipation: K59.09

## 2021-11-08 HISTORY — DX: Other specified noninflammatory disorders of vagina: N89.8

## 2021-11-08 LAB — POCT URINALYSIS DIPSTICK
Bilirubin, UA: NEGATIVE
Glucose, UA: POSITIVE — AB
Ketones, UA: NEGATIVE
Leukocytes, UA: NEGATIVE
Nitrite, UA: NEGATIVE
Odor: NEGATIVE
Protein, UA: NEGATIVE
Spec Grav, UA: 1.01 (ref 1.010–1.025)
Urobilinogen, UA: 0.2 E.U./dL
pH, UA: 6.5 (ref 5.0–8.0)

## 2021-11-08 NOTE — Progress Notes (Signed)
? ? ?GYNECOLOGY ANNUAL PREVENTATIVE CARE ENCOUNTER NOTE ? ?History:    ? [name] is a 36 y.o. [id number] female here for a routine annual gynecologic exam.  Current complaints: non pruritic lesion near labia and perimeum.   Denies abnormal vaginal bleeding, discharge, pelvic pain, problems with intercourse or other gynecologic concerns.  ?  ?Gynecologic History ?Patient's last menstrual period was 11/07/2021. ?Contraception: none ?Last Pap: 11/24/19. Results were: normal with negative HPV ? ? ?Obstetric History ?OB History  ?Gravida Para Term Preterm AB Living  ?'4 2 2   2 2  '$ ?SAB IAB Ectopic Multiple Live Births  ?  2     2  ?  ?# Outcome Date GA Lbr Len/2nd Weight Sex Delivery Anes PTL Lv  ?4 Term           ?3 Term           ?2 IAB           ?1 IAB           ? ? ?Past Medical History:  ?Diagnosis Date  ? Diabetic neuropathy ([place])   ? History of chicken pox   ? Type 1 diabetes ([place])   ? ? ?Past Surgical History:  ?Procedure Laterality Date  ? CESAREAN SECTION    ? only one C/S per pt.  ? DILATION AND CURETTAGE OF UTERUS    ? ? ?Current Outpatient Medications on File Prior to Visit  ?Medication Sig Dispense Refill  ? BD INSULIN SYRINGE U/F 31G X 5/16" 1 ML MISC SMARTSIG:1 Each SUB-Q Morning-Night    ? Continuous Blood Gluc Receiver (FREESTYLE LIBRE 14 DAY READER) DEVI Apply 1 Device topically continuous. E10.65 2 each -  ? Continuous Blood Gluc Sensor (FREESTYLE LIBRE 14 DAY SENSOR) MISC Apply 1 Device topically every 14 (fourteen) days. E10.65 2 each 6  ? cyanocobalamin (,VITAMIN B-12,) 1000 MCG/ML injection Inject into the muscle.    ? gabapentin (NEURONTIN) 300 MG capsule Take 1-2 capsules (300-600 mg total) by mouth at bedtime. 30 capsule 5  ? insulin detemir (LEVEMIR) 100 UNIT/ML injection Take 50units qhs, and 40 units qam 10 mL 2  ? Insulin Pen Needle (B-D UF III MINI PEN NEEDLES) 31G X 5 MM MISC To use with Insulin pens, as directed 90 each 0  ? Insulin Syringes, Disposable, U-100 1 ML MISC  Inject 1 each into the skin in the morning and at bedtime. 100 each 3  ? ondansetron (ZOFRAN) 4 MG tablet TAKE 1 TABLET BY MOUTH EVERY 8 HOURS AS NEEDED FOR NAUSEA AND VOMITING 30 tablet 0  ? Oxycodone HCl 10 MG TABS Take 10 mg by mouth 3 (three) times daily as needed.    ? traZODone (DESYREL) 100 MG tablet Take 100 mg by mouth at bedtime.    ? clindamycin (CLEOCIN) 2 % vaginal cream Place vaginally 2 (two) times daily. (Patient not taking: Reported on 11/08/2021)    ? fluconazole (DIFLUCAN) 200 MG tablet Take 1 tablet (200 mg total) by mouth every 3 (three) days. For 3 doses, then 1 pill WEEKLY until finished. (Patient not taking: Reported on 11/08/2021) 30 tablet 0  ? furosemide (LASIX) 20 MG tablet Take 20 mg by mouth daily. (Patient not taking: Reported on 11/08/2021)    ? insulin aspart (NOVOLOG FLEXPEN) 100 UNIT/ML FlexPen Inject 15-20 Units into the skin 3 (three) times daily with meals. plus sliding scale 35 mL 5  ? linaclotide (LINZESS) 72 MCG capsule Take 1-2 capsules (72-145  mcg total) by mouth daily before breakfast. For constipation. If no BM in 3 days, increase dose to 2 capsules daily and notify the office. (Patient not taking: Reported on 11/08/2021) 60 capsule 5  ? meloxicam (MOBIC) 15 MG tablet Take 1 tablet (15 mg total) by mouth daily. (Patient not taking: Reported on 11/08/2021) 30 tablet 0  ? pregabalin (LYRICA) 150 MG capsule Take 1 capsule (150 mg total) by mouth 3 (three) times daily. (Patient not taking: Reported on 11/08/2021) 90 capsule 2  ? tiZANidine (ZANAFLEX) 4 MG tablet SMARTSIG:1-2 Tablet(s) By Mouth Every Evening (Patient not taking: Reported on 11/08/2021)    ? ?No current facility-administered medications on file prior to visit.  ? ? ?Allergies  ?Allergen Reactions  ? Pramipexole Shortness Of Breath  ? ? ?Social History:  reports that she has never smoked. She has never been exposed to tobacco smoke. She has never used smokeless tobacco. She reports that she does not currently use drugs  after having used the following drugs: Marijuana. She reports that she does not drink alcohol. ? ?Family History  ?Problem Relation Age of Onset  ? Hypertension Mother   ? Diabetes Maternal Grandmother   ? Neuropathy Neg Hx   ? ? ?The following portions of the patient's history were reviewed and updated as appropriate: allergies, current medications, past family history, past medical history, past social history, past surgical history and problem list. ? ?Review of Systems ?Pertinent items noted in HPI and remainder of comprehensive ROS otherwise negative. ? ?Physical Exam:  ?BP 123/89   Pulse (!) 101   Ht '5\' 6"'$  (1.676 m)   Wt 167 lb (75.8 kg)   LMP 11/07/2021   BMI 26.95 kg/m?  ?CONSTITUTIONAL: Well-developed, well-nourished female in no acute distress.  ?HENT:  Normocephalic, atraumatic, External right and left ear normal. Oropharynx is clear and moist ?EYES: Conjunctivae and EOM are normal.  ?NECK: Normal range of motion, supple, no masses.  Normal thyroid.  ?SKIN: Skin is warm and dry. No rash noted. Not diaphoretic. No erythema. No pallor. ?MUSCULOSKELETAL: Normal range of motion. No tenderness.  No cyanosis, clubbing, or edema.  2+ distal pulses. ?NEUROLOGIC: Alert and oriented to person, place, and time. Normal reflexes, muscle tone coordination.  ?PSYCHIATRIC: Normal mood and affect. Normal behavior. Normal judgment and thought content. ?CARDIOVASCULAR: Normal heart rate noted, regular rhythm ?RESPIRATORY: Clear to auscultation bilaterally. Effort and breath sounds normal, no problems with respiration noted. ?BREASTS: Symmetric in size. No masses, tenderness, skin changes, nipple drainage, or lymphadenopathy bilaterally. Performed in the presence of a chaperone. ?ABDOMEN: Soft, no distention noted.  No tenderness, rebound or guarding.  ?PELVIC:hypopigmented and firm tissue near the perineum c/w genital warts and normal urethral meatus; normal appearing vaginal mucosa and cervix.  No abnormal discharge  noted. Normal uterine size, no other palpable masses, no uterine or adnexal tenderness.  Performed in the presence of a chaperone. ?  ?Assessment and Plan:  ?  1. Women's annual routine gynecological examination ?Normal annual exam ? ?- Ambulatory referral to [facility] ? ?2. Dysuria ?Pt notes mild dysuria with urination and urinary frequency ? ?- POCT Urinalysis Dipstick ? ?3. 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 ?4. Chronic constipation ?Pt is type 1 DM and is complaining of hemorrhoids.  Further discussion shows that pt is routinely constipated and has failed miralax and linzess, will refer to Gastroenterology for further eval ?- Ambulatory referral to Gastroenterology ? ?5.  Vaginal odor ?Vaginal swab to eval odor/discharge ?- Cervicovaginal ancillary only ? ?Routine preventative health maintenance measures emphasized. ?Please refer to After Visit Summary for other counseling recommendations.  ?   ?Discussed possibility of HPV lesion, discussed aldara and TCA treatment ?Will discuss with follow up in 3 weeks ? ?Lynnda Shields, MD, FACOG ?Obstetrician Social research officer, government, Faculty Practice ?[facility]  ?

## 2021-11-08 NOTE — Progress Notes (Signed)
NGYN pt presents for annual.  ?Pt reports dysuria x couple days.  ?Negative pap 11/24/2019. ?Pt is not interested in [facility] and she is not trying to conceive.  ?PHQ9=3 pt interested in counseling  ?GAD7=0  ?

## 2021-11-08 NOTE — Progress Notes (Signed)
VULVAR BIOPSY NOTE ?The indications for vulvar biopsy (rule out neoplasia, establish HPV lesion) were reviewed.   Risks of the biopsy including pain, bleeding, infection, inadequate specimen, scarring and need for additional procedures  were discussed. The patient stated understanding and agreed to undergo procedure today. Consent was signed,  time out performed.  ?The patient's vulva and perineum was prepped with Betadine. 1% lidocaine was injected into the area near the vaginal introitus. A 3-mm punch biopsy was done x 2, biopsy tissue was picked up with sterile forceps and sterile scissors were used to excise the lesion.  Small bleeding was noted and hemostasis was achieved using silver nitrate sticks.  The patient tolerated the procedure well. Post-procedure instructions  (pelvic rest for one week) were given to the patient. The patient is to call with heavy bleeding, fever greater than 100.4, foul smelling vaginal discharge or other concerns. The patient will be return to clinic in three weeks for discussion of results.  ? ?Lynnda Shields, MD ?

## 2021-11-09 LAB — CERVICOVAGINAL ANCILLARY ONLY
Bacterial Vaginitis (gardnerella): NEGATIVE
Candida Glabrata: NEGATIVE
Candida Vaginitis: POSITIVE — AB
Comment: NEGATIVE
Comment: NEGATIVE
Comment: NEGATIVE
Comment: NEGATIVE
Trichomonas: NEGATIVE

## 2021-11-09 LAB — SURGICAL PATHOLOGY

## 2021-11-11 ENCOUNTER — Other Ambulatory Visit: Payer: Self-pay | Admitting: Family

## 2021-11-11 DIAGNOSIS — E1065 Type 1 diabetes mellitus with hyperglycemia: Secondary | ICD-10-CM

## 2021-11-11 DIAGNOSIS — Z794 Long term (current) use of insulin: Secondary | ICD-10-CM

## 2021-11-12 ENCOUNTER — Other Ambulatory Visit (INDEPENDENT_AMBULATORY_CARE_PROVIDER_SITE_OTHER): Payer: BC Managed Care – PPO | Admitting: Obstetrics and Gynecology

## 2021-11-12 ENCOUNTER — Other Ambulatory Visit: Payer: Self-pay

## 2021-11-12 DIAGNOSIS — B3731 Acute candidiasis of vulva and vagina: Secondary | ICD-10-CM

## 2021-11-12 DIAGNOSIS — D071 Carcinoma in situ of vulva: Secondary | ICD-10-CM

## 2021-11-12 MED ORDER — FLUCONAZOLE 150 MG PO TABS: 150.0000 mg | ORAL_TABLET | Freq: Once | ORAL | 0 refills | Status: AC

## 2021-11-12 NOTE — Progress Notes (Signed)
After speaking with Dr. 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

## 2021-11-12 NOTE — Telephone Encounter (Signed)
ok to refill, but ask patient if she would rather have the insulin pens, vs. the vials? also, I thought I referred her to ENDO? if not, let me know, thx

## 2021-11-13 ENCOUNTER — Encounter: Payer: Self-pay | Admitting: Internal Medicine

## 2021-11-13 MED ORDER — LEVEMIR FLEXPEN 100 UNIT/ML ~~LOC~~ SOPN: PEN_INJECTOR | SUBCUTANEOUS | 0 refills | Status: DC

## 2021-11-13 NOTE — Telephone Encounter (Signed)
Pt states she would rather have insulin pens instead and she is not able to go to any within [place] or [place] endo.  ?

## 2021-11-14 ENCOUNTER — Ambulatory Visit (INDEPENDENT_AMBULATORY_CARE_PROVIDER_SITE_OTHER): Payer: BC Managed Care – PPO | Admitting: Licensed Clinical Social Worker

## 2021-11-14 DIAGNOSIS — F32A Depression, unspecified: Secondary | ICD-10-CM | POA: Diagnosis not present

## 2021-11-14 NOTE — Telephone Encounter (Signed)
Pt states she missed a lot of appointments so they dropped her.  ?

## 2021-11-15 NOTE — BH Specialist Note (Signed)
Integrated Behavioral Health via Telemedicine Visit ? ?11/15/2021 ?[name] ?[id number] ? ?Number of [facility] Clinician visits: 1 ?Session Start time:  10:30am ?Session End time: 11:05am ?Total time in minutes: 35 mins via phone per Caitlyn request  ? ?Referring Provider: Dr. Elgie Congo  ?Patient/Family location: Home  ?[facility] Provider location: [facility]  ?All persons participating in visit: [name] and LCSW A. Linton Rump  ?Types of Service: Individual psychotherapy and Telephone visit ? ?I connected with [name] and/or [name] via  Telephone or Geologist, engineering  (Video is Tree surgeon) and verified that I am speaking with the correct person using two identifiers. Discussed confidentiality: Yes  ? ?I discussed the limitations of telemedicine and the availability of in person appointments.  Discussed there is a possibility of technology failure and discussed alternative modes of communication if that failure occurs. ? ?I discussed that engaging in this telemedicine visit, they consent to the provision of behavioral healthcare and the services will be billed under their insurance. ? ?Patient and/or legal guardian expressed understanding and consented to Telemedicine visit: Yes  ? ?Presenting Concerns: ?Patient and/or family reports the following symptoms/concerns: depression  ?Duration of problem: over one year ; Severity of problem: mild ? ?Patient and/or Family's Strengths/Protective Factors: ?Concrete supports in place (healthy food, safe environments, etc.) ? ?Goals Addressed: ?Patient will: ? Reduce symptoms of: depression  ? Increase knowledge and/or ability of: coping skills  ? Demonstrate ability to: Increase healthy adjustment to current life circumstances ? ?Progress towards Goals: ?Ongoing ? ?Interventions: ?Interventions utilized:  Motivational Interviewing and Link to [employer] ?Standardized  Assessments completed: PHQ 9 ? ?Patient and/or Family Response:  ? ?Assessment: ?Patient currently experiencing depressive disorder.  ? ?Patient may benefit from integrated behavioral health. ? ?Plan: ?Follow up with behavioral health clinician on : 12/04/2021 ?Behavioral recommendations: Consider returning to school or take a class for self investment. Delegate task to prevent burnout, communicate needs with spouse for added support and most importantly engage in self care  ?Referral(s): [place] (In Clinic) ? ?I discussed the assessment and treatment plan with the patient and/or parent/guardian. They were provided an opportunity to ask questions and all were answered. They agreed with the plan and demonstrated an understanding of the instructions. ?  ?They were advised to call back or seek an in-person evaluation if the symptoms worsen or if the condition fails to improve as anticipated. ? ?Lynnea Ferrier, LCSW ?

## 2021-11-15 NOTE — Telephone Encounter (Signed)
she needs to schedule a f/u with me then for her diabetes, get labs done - I know she has a lot going on now with gynecology - but her diabetes is also important - Levemir pens refilled, thx

## 2021-11-20 MED ORDER — BD PEN NEEDLE MINI U/F 31G X 5 MM MISC: 3 refills | Status: DC

## 2021-11-20 NOTE — Telephone Encounter (Signed)
needles sent

## 2021-11-20 NOTE — Addendum Note (Signed)
Addended byJeanie Sewer on: 11/20/2021 04:50 PM ? ? Modules accepted: Orders ? ?

## 2021-11-26 ENCOUNTER — Ambulatory Visit: Payer: BC Managed Care – PPO | Admitting: Internal Medicine

## 2021-11-26 ENCOUNTER — Encounter: Payer: Self-pay | Admitting: Internal Medicine

## 2021-11-26 ENCOUNTER — Other Ambulatory Visit (INDEPENDENT_AMBULATORY_CARE_PROVIDER_SITE_OTHER): Payer: BC Managed Care – PPO

## 2021-11-26 VITALS — BP 118/80 | HR 104 | Ht 66.0 in | Wt 174.4 lb

## 2021-11-26 DIAGNOSIS — K5900 Constipation, unspecified: Secondary | ICD-10-CM | POA: Diagnosis not present

## 2021-11-26 DIAGNOSIS — D649 Anemia, unspecified: Secondary | ICD-10-CM

## 2021-11-26 DIAGNOSIS — F1190 Opioid use, unspecified, uncomplicated: Secondary | ICD-10-CM | POA: Diagnosis not present

## 2021-11-26 LAB — IBC + FERRITIN
Ferritin: 118.6 ng/mL (ref 10.0–291.0)
Iron: 82 ug/dL (ref 42–145)
Saturation Ratios: 23.3 % (ref 20.0–50.0)
TIBC: 351.4 ug/dL (ref 250.0–450.0)
Transferrin: 251 mg/dL (ref 212.0–360.0)

## 2021-11-26 LAB — FOLATE: Folate: 19.1 ng/mL (ref >5.9)

## 2021-11-26 LAB — VITAMIN B12: Vitamin B-12: 1312 pg/mL — ABNORMAL HIGH (ref 211–911)

## 2021-11-26 LAB — TSH: TSH: 0.97 u[IU]/mL (ref 0.35–5.50)

## 2021-11-26 MED ORDER — LINACLOTIDE 290 MCG PO CAPS: 290.0000 ug | ORAL_CAPSULE | Freq: Every day | ORAL | 3 refills | Status: DC

## 2021-11-26 NOTE — Progress Notes (Signed)
? ?Chief Complaint: Constipatoin ? ?HPI : 36 year female with history of DM and neuropathy presents with constipation ? ?She has been having severe constipation. She has tried Dulcolax and Miralax that have not been effective. She used to use suppositories and enemas, but these were not effective either. She has been on Linzess 145 mcg QD but does not feel like it is helping at all. She does not quite walk that frequently. She tried Metamucil but found no benefit from it. She takes oxycodone 10 mg TID to help with neuropathy symptoms. She is having lower ab pain that improves after having BM. Endorses nausea and vomiting. She vomits a few times per day. Denies blood in vomitus. Endorses regurgitation or dysphagia. Her weight is up and down. Denies EGD or colonoscopy. Denies fam hx of GI cancers. She has had two children in the past, one via vaginal delivery. ? ? ?Past Medical History:  ?Diagnosis Date  ? Diabetic neuropathy ([place])   ? History of chicken pox   ? Type 1 diabetes ([place])   ? ? ? ?Past Surgical History:  ?Procedure Laterality Date  ? CESAREAN SECTION    ? only one C/S per pt.  ? DILATION AND CURETTAGE OF UTERUS    ? ?Family History  ?Problem Relation Age of Onset  ? Hypertension Mother   ? Glaucoma Mother   ? Diabetes Maternal Grandmother   ? Neuropathy Neg Hx   ? Colon cancer Neg Hx   ? Stomach cancer Neg Hx   ? Esophageal cancer Neg Hx   ? Colon polyps Neg Hx   ? ?Social History  ? ?Tobacco Use  ? Smoking status: Never  ?  Passive exposure: Never  ? Smokeless tobacco: Never  ?Vaping Use  ? Vaping Use: Never used  ?Substance Use Topics  ? Alcohol use: No  ? Drug use: Not Currently  ?  Types: Marijuana  ? ?Current Outpatient Medications  ?Medication Sig Dispense Refill  ? BD INSULIN SYRINGE U/F 31G X 5/16" 1 ML MISC SMARTSIG:1 Each SUB-Q Morning-Night    ? Continuous Blood Gluc Receiver (FREESTYLE LIBRE 14 DAY READER) DEVI Apply 1 Device topically continuous. E10.65 2 each -  ? Continuous Blood Gluc  Sensor (FREESTYLE LIBRE 14 DAY SENSOR) MISC Apply 1 Device topically every 14 (fourteen) days. E10.65 2 each 6  ? cyanocobalamin (,VITAMIN B-12,) 1000 MCG/ML injection Inject into the muscle.    ? furosemide (LASIX) 20 MG tablet Take 20 mg by mouth daily.    ? gabapentin (NEURONTIN) 300 MG capsule Take 1-2 capsules (300-600 mg total) by mouth at bedtime. 30 capsule 5  ? insulin detemir (LEVEMIR FLEXPEN) 100 UNIT/ML FlexPen 50 units qhs, 40 units qam 15 mL 0  ? Insulin Pen Needle (B-D UF III MINI PEN NEEDLES) 31G X 5 MM MISC To use with Insulin pens, as directed 100 each 3  ? Insulin Syringes, Disposable, U-100 1 ML MISC Inject 1 each into the skin in the morning and at bedtime. 100 each 3  ? linaclotide (LINZESS) 290 MCG CAPS capsule Take 1 capsule (290 mcg total) by mouth daily before breakfast. 30 capsule 3  ? ondansetron (ZOFRAN) 4 MG tablet TAKE 1 TABLET BY MOUTH EVERY 8 HOURS AS NEEDED FOR NAUSEA AND VOMITING 30 tablet 0  ? Oxycodone HCl 10 MG TABS Take 10 mg by mouth 3 (three) times daily as needed.    ? tiZANidine (ZANAFLEX) 4 MG tablet     ? traZODone (DESYREL) 100 MG  tablet Take 100 mg by mouth at bedtime.    ? insulin aspart (NOVOLOG FLEXPEN) 100 UNIT/ML FlexPen Inject 15-20 Units into the skin 3 (three) times daily with meals. plus sliding scale 35 mL 5  ? ?No current facility-administered medications for this visit.  ? ?Allergies  ?Allergen Reactions  ? Pramipexole Shortness Of Breath  ? ? ? ?Review of Systems: ?All systems reviewed and negative except where noted in HPI.  ? ?Physical Exam: ?BP 118/80   Pulse (!) 104   Ht '5\' 6"'$  (1.676 m)   Wt 174 lb 6.4 oz (79.1 kg)   LMP 11/07/2021   SpO2 98%   BMI 28.15 kg/m?  ?Constitutional: Pleasant,well-developed, female in no acute distress. ?HEENT: Normocephalic and atraumatic. Conjunctivae are normal. No scleral icterus. ?Cardiovascular: Normal rate, regular rhythm.  ?Pulmonary/chest: Effort normal and breath sounds normal. No wheezing, rales or  rhonchi. ?Abdominal: Soft, nondistended, tender in the epigastric area and lower abdomen. Bowel sounds active throughout. There are no masses palpable. No hepatomegaly. ?Extremities: No edema ?Neurological: Alert and oriented to person place and time. ?Skin: Skin is warm and dry. No rashes noted. ?Psychiatric: Normal mood and affect. Behavior is normal. ? ?Labs 02/2021: CBC with elevated WBC of 11.9 and low Hb of 11.9. BMP with low K of 2.9. ? ?CT A/P w/contrast 02/15/21: ?IMPRESSION: ?1. Cholelithiasis. ?2. Suspected uterine fibroid. Correlation with nonemergent pelvic ultrasound is recommended. ? ?RUQ U/S 02/16/21: ?IMPRESSION: ?Probable sludge seen within gallbladder lumen. No other abnormality ?seen in the right upper quadrant of the abdomen. ? ?ASSESSMENT AND PLAN: ?Constipation ?Opioid use ?Anemia ?Patient presents with longstanding constipation that has been worsening over time.  She is on opioids for neuropathy related pain.  She has been refractory to multiple therapies for constipation including MiraLAX, Dulcolax, enemas, suppositories, and Linzess.  We will plan to go up on her Linzess therapy, could plan to use this in combination with other laxative therapies.  I did encourage her to try to reduce her opioid use since this is likely worsening her constipation issues.  She did seem open to trying to decrease her oxycodone.  Patient's diabetes is also fairly uncontrolled, which could be contributing to constipation as well.  We will plan for endoscopy and colonoscopy to rule out, gastritis/duodenitis, PUD, celiac disease, and anatomic obstruction in her colon. ?- Check ferritin, iron/TIBC, vitamin B12, folate, TSH ?- Encourage drink 8 cups of water per day ?- Walk 30 minutes a day ?- Increase Linzess from 145 mcg to 290 mcg QD ?- EGD/colonoscopy [facility] ?- Work on decreasing opioid use ? ?Christia Reading, MD ? ?I spent 61 minutes of time, including in depth chart review, independent review of results as outlined  above, communicating results with the patient directly, face-to-face time with the patient, coordinating care, ordering studies and medications as appropriate, and documentation.   ? ?

## 2021-11-26 NOTE — Patient Instructions (Signed)
If you are age 35 or older, your body mass index should be between 23-30. Your Body mass index is 28.15 kg/m?Marland Kitchen If this is out of the aforementioned range listed, please consider follow up with your Primary Care Provider. ? ?If you are age 22 or younger, your body mass index should be between 19-25. Your Body mass index is 28.15 kg/m?Marland Kitchen If this is out of the aformentioned range listed, please consider follow up with your Primary Care Provider.  ? ?Your provider has requested that you go to the basement level for lab work before leaving today. Press "B" on the elevator. The lab is located at the first door on the left as you exit the elevator. ? ?You have been scheduled for an endoscopy and colonoscopy. Please follow the written instructions given to you at your visit today. ?Please pick up your prep supplies at the pharmacy within the next 1-3 days. ?If you use inhalers (even only as needed), please bring them with you on the day of your procedure.  ? ?Drink eight cups of water a day. ? ?Walk for 30 minutes a day. ? ?The [facility] providers would like to encourage you to use [facility] to communicate with providers for non-urgent requests or questions.  Due to long hold times on the telephone, sending your provider a message by [facility] may be a faster and more efficient way to get a response.  Please allow 48 business hours for a response.  Please remember that this is for non-urgent requests.  ? ?Due to recent changes in healthcare laws, you may see the results of your imaging and laboratory studies on MyChart before your provider has had a chance to review them.  We understand that in some cases there may be results that are confusing or concerning to you. Not all laboratory results come back in the same time frame and the provider may be waiting for multiple results in order to interpret others.  Please give [place] 48 hours in order for your provider to thoroughly review all the results before contacting the office for  clarification of your results.  ? ?It was a pleasure to see you today! ? ?Thank you for trusting me with your gastrointestinal care!   ? ?Christia Reading, MD  ?_______________________________________________________ ? ? ?

## 2021-11-28 ENCOUNTER — Other Ambulatory Visit: Payer: Self-pay | Admitting: Family

## 2021-11-28 ENCOUNTER — Encounter: Payer: Self-pay | Admitting: Family

## 2021-11-28 ENCOUNTER — Encounter: Payer: Self-pay | Admitting: Obstetrics and Gynecology

## 2021-11-28 ENCOUNTER — Ambulatory Visit: Payer: BC Managed Care – PPO | Admitting: Family

## 2021-11-28 VITALS — BP 100/69 | HR 104 | Temp 98.1°F | Ht 66.0 in | Wt 174.1 lb

## 2021-11-28 DIAGNOSIS — K2100 Gastro-esophageal reflux disease with esophagitis, without bleeding: Secondary | ICD-10-CM | POA: Diagnosis not present

## 2021-11-28 DIAGNOSIS — E1065 Type 1 diabetes mellitus with hyperglycemia: Secondary | ICD-10-CM | POA: Diagnosis not present

## 2021-11-28 LAB — COMPREHENSIVE METABOLIC PANEL
ALT: 21 U/L (ref 0–35)
AST: 19 U/L (ref 0–37)
Albumin: 3.7 g/dL (ref 3.5–5.2)
Alkaline Phosphatase: 76 U/L (ref 39–117)
BUN: 9 mg/dL (ref 6–23)
CO2: 31 mEq/L (ref 19–32)
Calcium: 9.1 mg/dL (ref 8.4–10.5)
Chloride: 100 mEq/L (ref 96–112)
Creatinine, Ser: 0.7 mg/dL (ref 0.40–1.20)
GFR: 112.21 mL/min (ref >60.00)
Glucose, Bld: 66 mg/dL — ABNORMAL LOW (ref 70–99)
Potassium: 4 mEq/L (ref 3.5–5.1)
Sodium: 138 mEq/L (ref 135–145)
Total Bilirubin: 0.3 mg/dL (ref 0.2–1.2)
Total Protein: 6.5 g/dL (ref 6.0–8.3)

## 2021-11-28 LAB — LIPID PANEL
Cholesterol: 218 mg/dL — ABNORMAL HIGH (ref 0–200)
HDL: 80.2 mg/dL (ref >39.00)
LDL Cholesterol: 116 mg/dL — ABNORMAL HIGH (ref 0–99)
NonHDL: 137.52
Total CHOL/HDL Ratio: 3
Triglycerides: 109 mg/dL (ref 0.0–149.0)
VLDL: 21.8 mg/dL (ref 0.0–40.0)

## 2021-11-28 LAB — POCT GLYCOSYLATED HEMOGLOBIN (HGB A1C): Hemoglobin A1C: 8.8 % — AB (ref 4.0–5.6)

## 2021-11-28 MED ORDER — PANTOPRAZOLE SODIUM 20 MG PO TBEC: 20.0000 mg | DELAYED_RELEASE_TABLET | Freq: Every day | ORAL | 0 refills | Status: DC

## 2021-11-28 MED ORDER — LISINOPRIL 2.5 MG PO TABS: 2.5000 mg | ORAL_TABLET | Freq: Every day | ORAL | 0 refills | Status: DC

## 2021-11-28 MED ORDER — DEXCOM G6 SENSOR MISC: 1.0000 | 5 refills | Status: DC

## 2021-11-28 MED ORDER — DEXCOM G6 RECEIVER DEVI: 1.0000 | Freq: Every day | 0 refills | Status: DC

## 2021-11-28 NOTE — Patient Instructions (Addendum)
It was very nice to see you today! ? ?Go to the lab for blood work today. ?I sent over a medicine to help your nausea & heartburn, Protonix, take this twice a day for 2 weeks then 1 time daily. ?I sent a refill for the Zofran, but hope you will take less of this as the Protonix kicks in. ?Continue Levemir 50units every morning, and 42units in the evenings. Goal is for fasting blood sugar consistently less than 110. So increase by 2 units every couple of days on the evening dose. ?Increase the morning dose to 52units if your blood sugar is higher than 200 in at lunch or supper. ?Continue the Novolog before meals. ?I am sending order for the other continuous meter, Dexcom, let me know if still too expensive. ?Remember to drink at least 2 liters of water daily. ? ?Follow up in 3 mos. ? ? ? ? ? ? ?PLEASE NOTE: ? ?If you had any lab tests please let us know if you have not heard back within a few days. You may see your results on MyChart before we have a chance to review them but we will give you a call once they are reviewed by [place]. If we ordered any referrals today, please let us know if you have not heard from their office within the next week.  ? ?

## 2021-11-28 NOTE — Progress Notes (Signed)
? ?Subjective:  ? ? ? Patient ID: [name], female    DOB: November 17, 1985, 36 y.o.   MRN: [id number] ? ?Chief Complaint  ?Patient presents with  ? Diabetes  ? ?HPI: ?T1DM: Pt is currently maintained on the following medications for diabetes:Novolog tid and Levemir bid ?Failed meds include orals. ?Denies polyuria/polydipsia. Reports polyneuropathy, fatigue, nausea,  and vaginal yeast. ?Denies hypoglycemia ?Home glucose readings range 100-250. ?GERD: Patient is presenting for new symptoms. This has been associated with abdominal bloating, fullness after meals, heartburn, and nausea.  She denies choking on food. Symptoms have been present for  months. She denies dysphagia.  She has not lost weight. She denies melena, hematochezia, hematemesis, and coffee ground emesis. Medical therapy in the past has included none. She has been taking Zofran for daily nausea. ? ?Health Maintenance Due  ?Topic Date Due  ? TETANUS/TDAP  Never done  ? COVID-19 Vaccine (2 - Pfizer series) 01/12/2019  ? OPHTHALMOLOGY EXAM  07/19/2020  ? FOOT EXAM  10/26/2020  ? URINE MICROALBUMIN  10/26/2020  ? ? ?Past Medical History:  ?Diagnosis Date  ? Chronic constipation 11/08/2021  ? Diabetic neuropathy ([place])   ? History of chicken pox   ? Type 1 diabetes ([place])   ? Vaginal odor 11/08/2021  ? ? ?Past Surgical History:  ?Procedure Laterality Date  ? CESAREAN SECTION    ? only one C/S per pt.  ? DILATION AND CURETTAGE OF UTERUS    ? ? ?Outpatient Medications Prior to Visit  ?Medication Sig Dispense Refill  ? BD INSULIN SYRINGE U/F 31G X 5/16" 1 ML MISC SMARTSIG:1 Each SUB-Q Morning-Night    ? cyanocobalamin (,VITAMIN B-12,) 1000 MCG/ML injection Inject into the muscle.    ? furosemide (LASIX) 20 MG tablet Take 20 mg by mouth daily.    ? gabapentin (NEURONTIN) 300 MG capsule Take 1-2 capsules (300-600 mg total) by mouth at bedtime. 30 capsule 5  ? insulin aspart (NOVOLOG FLEXPEN) 100 UNIT/ML FlexPen Inject 15-20 Units into the skin 3 (three)  times daily with meals. plus sliding scale 35 mL 5  ? insulin detemir (LEVEMIR FLEXPEN) 100 UNIT/ML FlexPen 50 units qhs, 40 units qam 15 mL 0  ? Insulin Pen Needle (B-D UF III MINI PEN NEEDLES) 31G X 5 MM MISC To use with Insulin pens, as directed 100 each 3  ? Insulin Syringes, Disposable, U-100 1 ML MISC Inject 1 each into the skin in the morning and at bedtime. 100 each 3  ? linaclotide (LINZESS) 290 MCG CAPS capsule Take 1 capsule (290 mcg total) by mouth daily before breakfast. 30 capsule 3  ? ondansetron (ZOFRAN) 4 MG tablet TAKE 1 TABLET BY MOUTH EVERY 8 HOURS AS NEEDED FOR NAUSEA AND VOMITING 30 tablet 0  ? Oxycodone HCl 10 MG TABS Take 10 mg by mouth 3 (three) times daily as needed.    ? tiZANidine (ZANAFLEX) 4 MG tablet     ? traZODone (DESYREL) 100 MG tablet Take 100 mg by mouth at bedtime.    ? Continuous Blood Gluc Receiver (FREESTYLE LIBRE 14 DAY READER) DEVI Apply 1 Device topically continuous. E10.65 2 each -  ? Continuous Blood Gluc Sensor (FREESTYLE LIBRE 14 DAY SENSOR) MISC Apply 1 Device topically every 14 (fourteen) days. E10.65 2 each 6  ? ?No facility-administered medications prior to visit.  ? ? ?Allergies  ?Allergen Reactions  ? Pramipexole Shortness Of Breath  ? ? ? ?   ?Objective:  ?  ?Physical Exam ?Vitals  and nursing note reviewed.  ?Constitutional:   ?   Appearance: Normal appearance.  ?Cardiovascular:  ?   Rate and Rhythm: Normal rate and regular rhythm.  ?Pulmonary:  ?   Effort: Pulmonary effort is normal.  ?   Breath sounds: Normal breath sounds.  ?Musculoskeletal:     ?   General: Normal range of motion.  ?Skin: ?   General: Skin is warm and dry.  ?Neurological:  ?   Mental Status: She is alert.  ?Psychiatric:     ?   Mood and Affect: Mood normal.     ?   Behavior: Behavior normal.  ? ? ?BP 100/69 (BP Location: Right Arm, Patient Position: Sitting, Cuff Size: Large)   Pulse (!) 104   Temp 98.1 ?F (36.7 ?C) (Temporal)   Ht _0  (1.676 m)   Wt 174 lb 2 oz (79 kg)   LMP  11/08/2021 (Exact Date)   SpO2 99%   BMI 28.10 kg/m?  ?Wt Readings from Last 3 Encounters:  ?11/28/21 174 lb 2 oz (79 kg)  ?11/26/21 174 lb 6.4 oz (79.1 kg)  ?11/08/21 167 lb (75.8 kg)  ? ? ?   ?Assessment & Plan:  ? ?Problem List Items Addressed This Visit   ? ?  ? Digestive  ? Gastroesophageal reflux disease with esophagitis without hemorrhage  ?  Gastroparesis vs GERD, A1C is better but pt still reports continuous nausea, also reports some heartburn, pt on many different strong medications. Starting Protonix, advised on use & SE. ?  ?  ? Relevant Medications  ? pantoprazole (PROTONIX) 20 MG tablet  ?  ? Endocrine  ? Uncontrolled type 1 diabetes mellitus with hyperglycemia ([facility]) - Primary  ?  reports not able to see [facility] due to too many missed visits. A1C today 8.8.  Levemir 50units, 40units at night. reports fasting BS 100-150. Advised to increase evening to 42 units and then 44 until fasting BS is <120 consistently. Freestyle libre too expensive, sending Dexcom. f/u 3 mos. ?  ?  ? Relevant Medications  ? Continuous Blood Gluc Receiver ([place]) DEVI  ? Continuous Blood Gluc Sensor (DEXCOM G6 SENSOR) MISC  ? lisinopril (ZESTRIL) 2.5 MG tablet  ? Other Relevant Orders  ? Comp Met (CMET) (Completed)  ? Lipid panel (Completed)  ? POCT HgB A1C (Completed)  ? ? ?Meds ordered this encounter  ?Medications  ? Continuous Blood Gluc Receiver ([place]) DEVI  ?  Sig: 1 each by Does not apply route daily.  ?  Dispense:  1 each  ?  Refill:  0  ?  Order Specific Question:   Supervising Provider  ?  Answer:   ANDY, CAMILLE L [[id number]]  ? pantoprazole (PROTONIX) 20 MG tablet  ?  Sig: Take 1 tablet (20 mg total) by mouth daily. Take 1 pill twice a day for 2 weeks, then 1 pill every morning.  ?  Dispense:  90 tablet  ?  Refill:  0  ?  Order Specific Question:   Supervising Provider  ?  Answer:   ANDY, CAMILLE L [[id number]]  ? Continuous Blood Gluc Sensor (DEXCOM G6 SENSOR) MISC  ?  Sig: 1 each by Does not apply  route every 14 (fourteen) days.  ?  Dispense:  2 each  ?  Refill:  5  ?  Order Specific Question:   Supervising Provider  ?  Answer:   ANDY, CAMILLE L [[id number]]  ? lisinopril (ZESTRIL) 2.5 MG tablet  ?  Sig: Take 1 tablet (2.5 mg total) by mouth daily. For kidney protection from diabetes.  ?  Dispense:  90 tablet  ?  Refill:  0  ?  Order Specific Question:   Supervising Provider  ?  Answer:   ANDY, CAMILLE L [[id number]]  ? ? ?Jeanie Sewer, NP ? ?

## 2021-11-28 NOTE — Assessment & Plan Note (Addendum)
reports not able to see [facility] due to too many missed visits. A1C today 8.8.  Levemir 50units, 40units at night. reports fasting BS 100-150. Advised to increase evening to 42 units and then 44 until fasting BS is <120 consistently. Freestyle libre too expensive, sending Dexcom. f/u 3 mos. ?

## 2021-11-28 NOTE — Assessment & Plan Note (Signed)
Gastroparesis vs GERD, A1C is better but pt still reports continuous nausea, also reports some heartburn, pt on many different strong medications. Starting Protonix, advised on use & SE. ?

## 2021-11-29 ENCOUNTER — Encounter: Payer: Self-pay | Admitting: Obstetrics

## 2021-11-29 ENCOUNTER — Ambulatory Visit: Payer: BC Managed Care – PPO | Admitting: Obstetrics and Gynecology

## 2021-11-29 VITALS — BP 131/86 | HR 109 | Wt 176.2 lb

## 2021-11-29 DIAGNOSIS — A630 Anogenital (venereal) warts: Secondary | ICD-10-CM

## 2021-11-29 DIAGNOSIS — D071 Carcinoma in situ of vulva: Secondary | ICD-10-CM | POA: Diagnosis not present

## 2021-11-29 NOTE — Progress Notes (Signed)
Pt presents to discuss recent pathology results.  ?

## 2021-11-29 NOTE — Progress Notes (Signed)
?  CC: vulvar dysplasia ?Subjective:  ? ? Patient ID: Britlee Skolnik, female    DOB: [birth year], 36 y.o.   MRN: [id number] ? ?HPI ? ?Discussed results of vulvar biopsy.  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  Group consensus is referral to gyn onc due to higher grade dysplasia.  Plan relayed to patient who is in agreement.  Discussed with patietn that she may need resection treatment or other medical management . ? ?Review of Systems ? ?   ?Objective:  ? Physical Exam ?Vitals:  ? 11/29/21 0824  ?BP: 131/86  ?Pulse: (!) 109  ? ? ? ? ?   ?Assessment & Plan:  ? ?1. Severe vulvar dysplasia ?Will refer to gyn onc for future treatment of lesion ? 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 ? ?F/u prn ?I spent 10 minutes dedicated to the care of this patient including previsit review of records, face to face time with the patient discussing biopsy findings , treatment options, plan of care and post visit testing.  ?Griffin Basil, MD ?Faculty Attending, [facility]  ?

## 2021-11-29 NOTE — Patient Instructions (Signed)
You do not have cancer, but this was the closest information I could provide to give insight on the condition ?

## 2021-11-30 ENCOUNTER — Telehealth: Payer: Self-pay | Admitting: *Deleted

## 2021-11-30 NOTE — Telephone Encounter (Signed)
Spoke with the patient and scheduled a new patient with Dr Berline Lopes on May 1st at 10:30 am. Patient given an arrival time of 10:00 am.  Patient also given the address and phone number for the clinic; along with the policy for mask and visitors.  ?

## 2021-12-02 ENCOUNTER — Other Ambulatory Visit: Payer: Self-pay | Admitting: Family

## 2021-12-02 DIAGNOSIS — E785 Hyperlipidemia, unspecified: Secondary | ICD-10-CM | POA: Insufficient documentation

## 2021-12-02 DIAGNOSIS — E1069 Type 1 diabetes mellitus with other specified complication: Secondary | ICD-10-CM | POA: Insufficient documentation

## 2021-12-02 DIAGNOSIS — E1065 Type 1 diabetes mellitus with hyperglycemia: Secondary | ICD-10-CM

## 2021-12-02 MED ORDER — ROSUVASTATIN CALCIUM 10 MG PO TABS: 10.0000 mg | ORAL_TABLET | Freq: Every day | ORAL | 1 refills | Status: DC

## 2021-12-02 NOTE — Progress Notes (Signed)
Hi [name], ? ?Your glucose was actually slightly low, your electrolytes, liver & kidney function all normal. ? ?Total cholesterol number & LDL (bad #) are high. Because of your diabetes, with high cholesterol, it places you at a higher risk for heart attack or stroke, so it's important for you to be on a statin medication. This is recommended even with normal cholesterol. ?I am sending a low dose of generic Crestor. Start this every OTHER day for 2 weeks, then take it daily. ?Also need to reduce any fried foods, alcohol, nonnutritional snacks e.g. chips/cookies,pies, cakes and candies, fatty meat (red meat), high fat dairy foods:  including cheese, milk, ice cream.  ?Increase fruits/vegetables/fiber.   ?Continue or restart an exercise routine, shooting for 34mn 5-7days per week.  ? ?Any questions, let me know!

## 2021-12-04 ENCOUNTER — Encounter: Payer: BC Managed Care – PPO | Admitting: Licensed Clinical Social Worker

## 2021-12-04 ENCOUNTER — Telehealth: Payer: Self-pay | Admitting: Licensed Clinical Social Worker

## 2021-12-04 NOTE — Telephone Encounter (Signed)
Called pt twice regarding schedule mychart visit. Left message for callback  ?

## 2021-12-05 NOTE — Telephone Encounter (Signed)
Call the pharmacy - I just refilled this - did they not get it? why are they sending again? and what are those Endocrinology notes? Am I supposed to do something with that? Let me know, thx!

## 2021-12-05 NOTE — Telephone Encounter (Signed)
ok - just resend it then, thx

## 2021-12-12 ENCOUNTER — Telehealth: Payer: Self-pay | Admitting: Family

## 2021-12-12 NOTE — Telephone Encounter (Signed)
Pt states she would like to know if she can get a medication for sleep sent over to pharmacy. Caller states she has neuropathy and is unable to sleep. Caller states she does not want an antidepressant. ?

## 2021-12-12 NOTE — Telephone Encounter (Signed)
VML with patient to call office back and sch a fu with Colletta Maryland -  ? ? ?Patient ?Name: ?[name] ?CADOO ?Gender: Female ?DOB: 08-20-1985 ?Age: 36 Y 2 Garner ?Return ?Phone ?Number: ?[phone] ?(Primary) ?Address: ?City/ ?State/ ?Zip: ?[place] ? [address] ?Client [place] [facility] [place] [facility] - ?Clie ?Presenter, broadcasting at [place] [facility] ?Contact Type Call ?Who Is Calling Patient / Member / Family / Caregiver ?Call Type Triage / Clinical ?Relationship To Patient Self ?Return Phone Number [phone] (Primary) ?Chief Complaint Insomnia ?Reason for Call Medication Question / Request ?Initial Comment Caller states she would like to know if she can ?get a medication for sleep sent over to pharmacy. ?Caller states she has neuropathy and is unable ?to sleep. Caller states she does not want an ?antidepressant. ?Translation No ?Nurse Assessment ?Nurse: Rolin Barry, RN, Levada Dy Date/Time Eilene Ghazi Time): 12/11/2021 11:24:35 AM ?Confirm and document reason for call. If ?symptomatic, describe symptoms. ?---Caller states she would like to know if she can get ?a medication for sleep sent over to pharmacy. Caller ?states she has neuropathy and is unable to sleep. Caller ?states she does not want an antidepressant. Caller ?advised that she has trazodone but advised that the ?medication does not work. ?Does the patient have any new or worsening ?symptoms? ---Yes ?Will a triage be completed? ---Yes ?Related visit to physician within the last 2 weeks? ---Yes ?Does the PT have any chronic conditions? (i.e. ?diabetes, asthma, this includes High risk factors for ?pregnancy, etc.) ?---Yes ?List chronic conditions. ---Diabetes neuropathy ?Is the patient pregnant or possibly pregnant? (Ask ?all females between the ages of 31-55) ---No ?Is this a behavioral health or substance abuse call? ---No ?Guidelines ?Guideline Title Affirmed Question Affirmed Notes Nurse Date/Time (Eastern ?Time) ?Insomnia [1] Pain is  causing ?insomnia AND [2] ?Rolin Barry, RN, Levada Dy 12/11/2021 11:26:56 ?AM ?PLEASE NOTE: All timestamps contained within this report are represented as [place] Standard Time. ?CONFIDENTIALTY NOTICE: This fax transmission is intended only for the addressee. It contains information that is legally privileged, confidential or ?otherwise protected from use or disclosure. If you are not the intended recipient, you are strictly prohibited from reviewing, disclosing, copying using ?or disseminating any of this information or taking any action in reliance on or regarding this information. If you have received this fax in error, please ?notify us immediately by telephone so that we can arrange for its return to [place]. Phone: [phone], Toll-Free: [phone], Fax: [phone] ?Page: 2 of 2 ?Call Id: [id number] ?Guidelines ?Guideline Title Affirmed Question Affirmed Notes Nurse Date/Time (Eastern ?Time) ?pain is a chronic ?symptom (recurrent ?or ongoing AND ?present > 4 weeks) ?Disp. Time (Eastern ?Time) Disposition Final User ?12/11/2021 11:04:36 AM Attempt made - message left Deaton, RN, Levada Dy ?12/11/2021 11:14:56 AM Attempt made - message left Deaton, RN, Levada Dy ?12/11/2021 11:28:00 AM SEE PCP WITHIN 3 DAYS Yes Deaton, RN, Levada Dy ?Caller Disagree/Comply Comply ?Caller Understands Yes ?PreDisposition Did not know what to do ?Care Advice Given Per Guideline ?SEE PCP WITHIN 3 DAYS: * You need to be seen within 2 or 3 days. * ACETAMINOPHEN - EXTRA STRENGTH TYLENOL: ?Take 1,000 mg (two 500 mg pills) every 6 to 8 hours as needed. Each Extra Strength Tylenol pill has 500 mg of acetaminophen. The ?most you should take is 6 pills a day (3,000 mg total). Note: In [place], the maximum is 8 pills a day (4,000 mg total). CALL BACK ?IF: * You become worse CARE ADVICE given per Insomnia (Adult) guideline. ?

## 2021-12-12 NOTE — Telephone Encounter (Signed)
Pt states she is taking this and it is not working.  ?

## 2021-12-12 NOTE — Telephone Encounter (Signed)
She is already on the Trazodone for sleep, it is labeled as an antidepressant, but does not work well for this, it is mainly for sleep. Is she taking this and it's not working?

## 2021-12-14 ENCOUNTER — Other Ambulatory Visit: Payer: Self-pay | Admitting: Family

## 2021-12-14 MED ORDER — ESZOPICLONE 2 MG PO TABS: 2.0000 mg | ORAL_TABLET | Freq: Every evening | ORAL | 2 refills | Status: DC | PRN

## 2021-12-14 NOTE — Telephone Encounter (Signed)
I sent Lunesta, there are only 3 doses, I sent the middle dose, she should not take 2 pills if not working, she should let me know after 2 weeks and I will send a higher dose if needed. Thx

## 2021-12-17 ENCOUNTER — Inpatient Hospital Stay: Payer: BC Managed Care – PPO | Attending: Gynecologic Oncology | Admitting: Gynecologic Oncology

## 2021-12-17 ENCOUNTER — Encounter: Payer: Self-pay | Admitting: Gynecologic Oncology

## 2021-12-17 ENCOUNTER — Other Ambulatory Visit: Payer: Self-pay

## 2021-12-17 VITALS — BP 125/102 | HR 93 | Temp 98.4°F | Resp 16 | Ht 66.0 in | Wt 166.3 lb

## 2021-12-17 DIAGNOSIS — N901 Moderate vulvar dysplasia: Secondary | ICD-10-CM | POA: Diagnosis present

## 2021-12-17 DIAGNOSIS — Z79899 Other long term (current) drug therapy: Secondary | ICD-10-CM | POA: Insufficient documentation

## 2021-12-17 DIAGNOSIS — K6282 Dysplasia of anus: Secondary | ICD-10-CM | POA: Diagnosis present

## 2021-12-17 DIAGNOSIS — Z794 Long term (current) use of insulin: Secondary | ICD-10-CM | POA: Insufficient documentation

## 2021-12-17 DIAGNOSIS — K649 Unspecified hemorrhoids: Secondary | ICD-10-CM | POA: Diagnosis not present

## 2021-12-17 DIAGNOSIS — K5900 Constipation, unspecified: Secondary | ICD-10-CM | POA: Insufficient documentation

## 2021-12-17 DIAGNOSIS — E1065 Type 1 diabetes mellitus with hyperglycemia: Secondary | ICD-10-CM | POA: Insufficient documentation

## 2021-12-17 DIAGNOSIS — E1040 Type 1 diabetes mellitus with diabetic neuropathy, unspecified: Secondary | ICD-10-CM | POA: Diagnosis not present

## 2021-12-17 DIAGNOSIS — K6289 Other specified diseases of anus and rectum: Secondary | ICD-10-CM | POA: Diagnosis not present

## 2021-12-17 NOTE — Patient Instructions (Addendum)
It was very nice to meet you today.  My office will be sending a referral to the general surgery office to get you scheduled to see one of the colorectal surgeons that we discussed today.  The goal would be to do one surgery where we can treat all of the precancer on your vulva and around the anus. ? 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  This area on exam spans from the outside of the vagina to your anus. ? ?We can typically treat this with surgery, either excision or removal of the precancerous tissue or laser (burning).  Because of the amount of tissue that is affected by precancer as well as her diabetes, I recommend laser treatment.  I would plan to take a couple additional biopsies to make sure that there was no underlying cancer.  Based on what I am seeing today, findings support that this is precancer. ? ?Much vulvar precancer is caused by the human papilloma virus.  This is a sexually transmitted disease but 1 that women can have for many many years without any signs or symptoms.  We also know that certain things make precancer from HPV more likely, such as tobacco use and immunosuppression (any sort of suppression of your immune system, like HIV infection). ? ?Please call let my clinic know after you have seen the general surgeon.  This will help [place] speed up the process of coordinating a joint surgery. ?

## 2021-12-17 NOTE — Telephone Encounter (Signed)
Lvm with directions of use and if any questions to contact us. ?

## 2021-12-17 NOTE — Progress Notes (Signed)
GYNECOLOGIC ONCOLOGY NEW PATIENT CONSULTATION  ? ?Patient Name: [name]  ?Patient Age: 36 y.o. ?Date of Service: 12/17/21 ?Referring Provider: Dr. Lynnda Shields ? ?Primary Care Provider: Jeanie Sewer, NP ?Consulting Provider: Jeral Pinch, MD  ? 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 ? 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  We discussed the etiology of vulvar dysplasia and its relation either to chronic skin condition (lichen sclerosus) or the human papilloma virus.  Given biopsy results, hers appears to be related to HPV.  I discussed risk factors and cofactors including tobacco use and immunosuppression.  The patient has had recent HIV testing which was negative.  She does have poorly controlled diabetes mellitus. ? ?In the setting of high-grade vulvar dysplasia, we discussed treatment options.  Given risk of progression to malignancy with high-grade dysplasia, I recommend treatment given the extent of her disease with either excision or laser ablation.  In the setting of her poorly controlled diabetes as well as the location and size of her dysplasia, I think surgical excision will be somewhat difficult and she is at high risk of poor healing because of her diabetes.  I recommend proceeding with laser ablation and some additional biopsies taken at the time of laser to help rule out underlying carcinoma.  I do not see findings on exam today that support underlying carcinoma. ? ?Given proximity and involvement of some of her hemorrhoids and the perianal area, I recommended consultation with one of our colorectal surgeons to treat anal and perianal dysplasia.  Ideally, we will plan a joint procedure for laser ablation of the vulva and perianal areas.  Referral was sent to [facility] surgery today.  I have asked the patient  to call to let us know after she sees someone at [facility] surgery.  We will plan to coordinate a surgery date and have the patient come back for perioperative teaching. ? ?A copy of this note was sent to the patient's referring provider.  ? ?55 minutes of total time was spent for this patient encounter, including preparation, face-to-face counseling with the patient and coordination of care, and documentation of the encounter. ? ?Jeral Pinch, MD  ?Division of Gynecologic Oncology  ?Department of Obstetrics and Gynecology  ?[facility]  ?___________________________________________  ?Chief Complaint: ?Chief Complaint  ?Patient presents with  ? Vulvar intraepithelial neoplasia (VIN) grade 2  ? ? ?History of Present Illness:  ?[name] is a 36 y.o. y.o. female who is seen in consultation at the request of Dr. Elgie Congo for an evaluation of vulvar dysplasia. ? ?Patient initially was seen last summer in July with "vaginal bumps" and pruritus.  She thought initially that she had a yeast infection.  Shortly after that time, she felt "bumps" on the backside of the vaginal opening.  She went to urgent care and given symptoms and findings on exam, patient was treated for yeast infection with Diflucan.  She denies any improvement in her symptoms.  She went back to urgent care in September with similar vaginal symptoms as well as some urinary symptoms.  She was treated presumptively for UTI and was treated again for Candida infection and for BV.  She ultimately was seen by OB/GYN in March.  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 ? ?She continues to have vulvar and perianal pruritus.  She has some pain related to pruritus on or near her hemorrhoids.  She denies any discharge or bleeding.  She endorses a good appetite.  She has a history of constipation, is currently on Linzess.  She is seeing gastroenterology soon and  has a colonoscopy on 5/11.  She denies any urinary symptoms currently. ? ?History is notable for type 1 diabetes, on insulin.  Last hemoglobin A1c on 11/28/2021 was 8.8% ? ?PAST MEDICAL HISTORY:  ?Past Medical History:  ?Diagnosis Date  ? Chronic constipation 11/08/2021  ? Diabetic neuropathy ([place])   ? History of chicken pox   ? Type 1 diabetes ([place])   ? Vaginal odor 11/08/2021  ?  ? ?PAST SURGICAL HISTORY:  ?Past Surgical History:  ?Procedure Laterality Date  ? CESAREAN SECTION    ? only one C/S per pt.  ? DILATION AND CURETTAGE OF UTERUS    ? ? ?OB/GYN HISTORY:  ?OB History  ?Gravida Para Term Preterm AB Living  ?'4 2 2   2 2  '$ ?SAB IAB Ectopic Multiple Live Births  ?  2     2  ?  ?# Outcome Date GA Lbr Len/2nd Weight Sex Delivery Anes PTL Lv  ?4 Term           ?3 Term           ?2 IAB           ?1 IAB           ? ? ?No LMP recorded. ? ?Age at menarche: 21 ?Age at menopause: Not applicable ?Hx of HRT: Not applicable ?Hx of STDs: Denies ?Last pap: 11/2019 -negative, HPV HR negative ?History of abnormal pap smears: Denies ? ?Describes her menses as painful with associated cramping.  Tried Depo recently but this did not help her symptoms.  ?SCREENING STUDIES:  ?Last mammogram: Has never had  ?Last colonoscopy: upcoming, 12/2021 ? ?MEDICATIONS: ?Outpatient Encounter Medications as of 12/17/2021  ?Medication Sig  ? BD INSULIN SYRINGE U/F 31G X 5/16" 1 ML MISC SMARTSIG:1 Each SUB-Q Morning-Night  ? Continuous Blood Gluc Receiver ([place]) [place]VI 1 each by Does not apply route daily.  ? Continuous Blood Gluc Sensor (DEXCOM G6 SENSOR) MISC 1 each by Does not apply route every 14 (fourteen) days.  ? cyanocobalamin (,VITAMIN B-12,) 1000 MCG/ML injection Inject into the muscle.  ? esomeprazole (NEXIUM) 10 MG packet Take 10 mg by mouth daily before breakfast.  ? furosemide (LASIX) 20 MG tablet Take 20 mg by mouth daily.  ? gabapentin (NEURONTIN) 300 MG capsule Take 1-2 capsules (300-600 mg total) by mouth at bedtime.  ?  insulin aspart (NOVOLOG FLEXPEN) 100 UNIT/ML FlexPen Inject 15-20 Units into the skin 3 (three) times daily with meals. plus sliding scale  ? insulin detemir (LEVEMIR FLEXPEN) 100 UNIT/ML FlexPen 50 UNITS AT BEDTIME, 40 UNITS EACH AM *D/C LEVEMIR VIALS  ? Insulin Pen Needle (B-D UF III MINI PEN NEEDLES) 31G X 5 MM MISC To use with Insulin pens, as directed  ? Insulin Syringes, Disposable, U-100 1 ML MISC Inject 1 each into the skin in the morning and at bedtime.  ? linaclotide (LINZESS) 290 MCG CAPS capsule Take 1 capsule (290 mcg total) by mouth daily before breakfast.  ? lisinopril (ZESTRIL) 2.5 MG tablet Take 1 tablet (2.5 mg total) by mouth daily. For kidney protection from diabetes.  ? ondansetron (ZOFRAN) 4 MG tablet TAKE 1 TABLET BY MOUTH EVERY 8 HOURS AS NEEDED FOR NAUSEA AND VOMITING  ?  Oxycodone HCl 10 MG TABS Take 10 mg by mouth 3 (three) times daily as needed.  ? rosuvastatin (CRESTOR) 10 MG tablet Take 1 tablet (10 mg total) by mouth daily. Start 1 pill qod for 2 weeks, then 1 pill qd.  ? traZODone (DESYREL) 100 MG tablet Take 100 mg by mouth at bedtime.  ? [DISCONTINUED] tiZANidine (ZANAFLEX) 4 MG tablet  (Patient not taking: Reported on 12/12/2021)  ? ?No facility-administered encounter medications on file as of 12/17/2021.  ? ? ?ALLERGIES:  ?Allergies  ?Allergen Reactions  ? Pramipexole Shortness Of Breath  ?  ? ?FAMILY HISTORY:  ?Family History  ?Problem Relation Age of Onset  ? Hypertension Mother   ? Glaucoma Mother   ? Diabetes Maternal Grandmother   ? Neuropathy Neg Hx   ? Colon cancer Neg Hx   ? Stomach cancer Neg Hx   ? Esophageal cancer Neg Hx   ? Colon polyps Neg Hx   ?  ? ?SOCIAL HISTORY:  ?Social Connections: Not on file  ? ? ?REVIEW OF SYSTEMS:  ?+ Constipation, menstrual problems ?Denies appetite changes, fevers, chills, fatigue, unexplained weight changes. ?Denies hearing loss, neck lumps or masses, mouth sores, ringing in ears or voice changes. ?Denies cough or wheezing.  Denies shortness  of breath. ?Denies chest pain or palpitations. Denies leg swelling. ?Denies abdominal distention, pain, blood in stools, diarrhea, nausea, vomiting, or early satiety. ?Denies pain with intercourse, dysuria,

## 2021-12-18 ENCOUNTER — Telehealth: Payer: Self-pay | Admitting: *Deleted

## 2021-12-18 NOTE — Telephone Encounter (Signed)
Per Dr Berline Lopes, fax records and referral form to [facility]  ?

## 2021-12-20 ENCOUNTER — Encounter: Payer: Self-pay | Admitting: Internal Medicine

## 2021-12-25 ENCOUNTER — Ambulatory Visit: Payer: BC Managed Care – PPO | Admitting: Licensed Clinical Social Worker

## 2021-12-27 ENCOUNTER — Ambulatory Visit (AMBULATORY_SURGERY_CENTER): Payer: BC Managed Care – PPO | Admitting: Internal Medicine

## 2021-12-27 ENCOUNTER — Other Ambulatory Visit: Payer: Self-pay | Admitting: Family

## 2021-12-27 ENCOUNTER — Encounter: Payer: Self-pay | Admitting: Internal Medicine

## 2021-12-27 VITALS — BP 123/92 | HR 117 | Temp 97.8°F | Resp 12 | Ht 66.0 in | Wt 174.0 lb

## 2021-12-27 DIAGNOSIS — R112 Nausea with vomiting, unspecified: Secondary | ICD-10-CM

## 2021-12-27 DIAGNOSIS — D123 Benign neoplasm of transverse colon: Secondary | ICD-10-CM

## 2021-12-27 DIAGNOSIS — K259 Gastric ulcer, unspecified as acute or chronic, without hemorrhage or perforation: Secondary | ICD-10-CM | POA: Diagnosis not present

## 2021-12-27 DIAGNOSIS — R110 Nausea: Secondary | ICD-10-CM

## 2021-12-27 DIAGNOSIS — K5900 Constipation, unspecified: Secondary | ICD-10-CM

## 2021-12-27 DIAGNOSIS — D649 Anemia, unspecified: Secondary | ICD-10-CM

## 2021-12-27 DIAGNOSIS — D120 Benign neoplasm of cecum: Secondary | ICD-10-CM | POA: Diagnosis not present

## 2021-12-27 DIAGNOSIS — K319 Disease of stomach and duodenum, unspecified: Secondary | ICD-10-CM | POA: Diagnosis not present

## 2021-12-27 DIAGNOSIS — K2970 Gastritis, unspecified, without bleeding: Secondary | ICD-10-CM

## 2021-12-27 MED ORDER — ESOMEPRAZOLE MAGNESIUM 40 MG PO CPDR: 40.0000 mg | DELAYED_RELEASE_CAPSULE | Freq: Two times a day (BID) | ORAL | 0 refills | Status: DC

## 2021-12-27 MED ORDER — SODIUM CHLORIDE 0.9 % IV SOLN: 500.0000 mL | Freq: Once | INTRAVENOUS | Status: DC

## 2021-12-27 NOTE — Progress Notes (Signed)
? ?GASTROENTEROLOGY PROCEDURE H&P NOTE  ? ?Primary Care Physician: ?Jeanie Sewer, NP ? ? ? ?Reason for Procedure:   Anemia, constipation ? ?Plan:    EGD/colonoscopy ? ?Patient is appropriate for endoscopic procedure(s) in the ambulatory ([place]) setting. ? ?The nature of the procedure, as well as the risks, benefits, and alternatives were carefully and thoroughly reviewed with the patient. Ample time for discussion and questions allowed. The patient understood, was satisfied, and agreed to proceed.  ? ? ? ?HPI: ?[name] is a 36 y.o. female who presents for EGD/colonoscopyfor evaluation of anemia and constipation .  Patient was most recently seen in the Gastroenterology Clinic on 11/26/21.  No interval change in medical history since that appointment. Please refer to that note for full details regarding GI history and clinical presentation.  ? ?Past Medical History:  ?Diagnosis Date  ? Chronic constipation 11/08/2021  ? Diabetic neuropathy ([place])   ? History of chicken pox   ? Type 1 diabetes ([place])   ? Vaginal odor 11/08/2021  ? ? ?Past Surgical History:  ?Procedure Laterality Date  ? CESAREAN SECTION    ? only one C/S per pt.  ? DILATION AND CURETTAGE OF UTERUS    ? ? ?Prior to Admission medications   ?Medication Sig Start Date End Date Taking? Authorizing Provider  ?BD INSULIN SYRINGE U/F 31G X 5/16" 1 ML MISC SMARTSIG:1 Each SUB-Q Morning-Night 08/10/21  Yes [provider]  ?esomeprazole (NEXIUM) 10 MG packet Take 10 mg by mouth daily before breakfast. 11/29/21  Yes Hudnell, Colletta Maryland, NP  ?eszopiclone (LUNESTA) 2 MG TABS tablet Take 1 tablet (2 mg total) by mouth at bedtime as needed for sleep. Take immediately before bedtime 12/14/21  Yes Hudnell, Colletta [place], NP  ?furosemide (LASIX) 20 MG tablet Take 20 mg by mouth daily. 08/09/21  Yes [provider]  ?gabapentin (NEURONTIN) 300 MG capsule Take 1-2 capsules (300-600 mg total) by mouth at bedtime. 09/10/21  Yes Jeanie Sewer, NP  ?insulin aspart (NOVOLOG FLEXPEN) 100 UNIT/ML FlexPen Inject 15-20 Units into the skin 3 (three) times daily with meals. plus sliding scale 08/10/21 12/27/21 Yes Hudnell, Colletta [place], NP  ?insulin detemir (LEVEMIR FLEXPEN) 100 UNIT/ML FlexPen 50 UNITS AT BEDTIME, 40 UNITS EACH AM *D/C LEVEMIR VIALS 12/05/21  Yes Jeanie Sewer, NP  ?Insulin Pen Needle (B-D UF III MINI PEN NEEDLES) 31G X 5 MM MISC To use with Insulin pens, as directed 11/20/21  Yes Jeanie Sewer, NP  ?Insulin Syringes, Disposable, U-100 1 ML MISC Inject 1 each into the skin in the morning and at bedtime. 08/10/21  Yes Jeanie Sewer, NP  ?linaclotide (LINZESS) 290 MCG CAPS capsule Take 1 capsule (290 mcg total) by mouth daily before breakfast. 11/26/21  Yes Sharyn Creamer, MD  ?lisinopril (ZESTRIL) 2.5 MG tablet Take 1 tablet (2.5 mg total) by mouth daily. For kidney protection from diabetes. 11/28/21  Yes Jeanie Sewer, NP  ?metroNIDAZOLE (FLAGYL) 500 MG tablet Take 500 mg by mouth 2 (two) times daily. 12/26/21  Yes [provider]  ?nitrofurantoin (MACRODANTIN) 100 MG capsule Take 100 mg by mouth 2 (two) times daily. 12/26/21  Yes [provider]  ?Oxycodone HCl 10 MG TABS Take 10 mg by mouth 3 (three) times daily as needed. 07/18/21  Yes [provider]  ?rosuvastatin (CRESTOR) 10 MG tablet Take 1 tablet (10 mg total) by mouth daily. Start 1 pill qod for 2 weeks, then 1 pill qd. 12/02/21  Yes Jeanie Sewer, NP  ?traZODone (DESYREL) 100  MG tablet Take 100 mg by mouth at bedtime. 08/07/21  Yes [provider]  ?Continuous Blood Gluc Receiver ([place]) [place] 1 each by Does not apply route daily. ?Patient not taking: Reported on 12/27/2021 11/28/21   Jeanie Sewer, NP  ?Continuous Blood Gluc Sensor (DEXCOM G6 SENSOR) MISC 1 each by Does not apply route every 14 (fourteen) days. ?Patient not taking: Reported on 12/27/2021 11/28/21   Jeanie Sewer, NP  ?cyanocobalamin  (,VITAMIN B-12,) 1000 MCG/ML injection Inject into the muscle. 07/16/21   [provider]  ?ondansetron (ZOFRAN) 4 MG tablet TAKE 1 TABLET BY MOUTH EVERY 8 HOURS AS NEEDED FOR NAUSEA AND VOMITING ?Patient not taking: Reported on 12/27/2021 11/02/21   Jeanie Sewer, NP  ? ? ?Current Outpatient Medications  ?Medication Sig Dispense Refill  ? BD INSULIN SYRINGE U/F 31G X 5/16" 1 ML MISC SMARTSIG:1 Each SUB-Q Morning-Night    ? esomeprazole (NEXIUM) 10 MG packet Take 10 mg by mouth daily before breakfast. 90 each 0  ? eszopiclone (LUNESTA) 2 MG TABS tablet Take 1 tablet (2 mg total) by mouth at bedtime as needed for sleep. Take immediately before bedtime 30 tablet 2  ? furosemide (LASIX) 20 MG tablet Take 20 mg by mouth daily.    ? gabapentin (NEURONTIN) 300 MG capsule Take 1-2 capsules (300-600 mg total) by mouth at bedtime. 30 capsule 5  ? insulin aspart (NOVOLOG FLEXPEN) 100 UNIT/ML FlexPen Inject 15-20 Units into the skin 3 (three) times daily with meals. plus sliding scale 35 mL 5  ? insulin detemir (LEVEMIR FLEXPEN) 100 UNIT/ML FlexPen 50 UNITS AT BEDTIME, 40 UNITS EACH AM *D/C LEVEMIR VIALS 15 mL 0  ? Insulin Pen Needle (B-D UF III MINI PEN NEEDLES) 31G X 5 MM MISC To use with Insulin pens, as directed 100 each 3  ? Insulin Syringes, Disposable, U-100 1 ML MISC Inject 1 each into the skin in the morning and at bedtime. 100 each 3  ? linaclotide (LINZESS) 290 MCG CAPS capsule Take 1 capsule (290 mcg total) by mouth daily before breakfast. 30 capsule 3  ? lisinopril (ZESTRIL) 2.5 MG tablet Take 1 tablet (2.5 mg total) by mouth daily. For kidney protection from diabetes. 90 tablet 0  ? metroNIDAZOLE (FLAGYL) 500 MG tablet Take 500 mg by mouth 2 (two) times daily.    ? nitrofurantoin (MACRODANTIN) 100 MG capsule Take 100 mg by mouth 2 (two) times daily.    ? Oxycodone HCl 10 MG TABS Take 10 mg by mouth 3 (three) times daily as needed.    ? rosuvastatin (CRESTOR) 10 MG tablet Take 1 tablet (10 mg total) by  mouth daily. Start 1 pill qod for 2 weeks, then 1 pill qd. 90 tablet 1  ? traZODone (DESYREL) 100 MG tablet Take 100 mg by mouth at bedtime.    ? Continuous Blood Gluc Receiver ([place]) DEVI 1 each by Does not apply route daily. (Patient not taking: Reported on 12/27/2021) 1 each 0  ? Continuous Blood Gluc Sensor (DEXCOM G6 SENSOR) MISC 1 each by Does not apply route every 14 (fourteen) days. (Patient not taking: Reported on 12/27/2021) 2 each 5  ? cyanocobalamin (,VITAMIN B-12,) 1000 MCG/ML injection Inject into the muscle.    ? ondansetron (ZOFRAN) 4 MG tablet TAKE 1 TABLET BY MOUTH EVERY 8 HOURS AS NEEDED FOR NAUSEA AND VOMITING (Patient not taking: Reported on 12/27/2021) 30 tablet 0  ? ?Current Facility-Administered Medications  ?Medication Dose Route Frequency Provider Last Rate  Last Admin  ? 0.9 %  sodium chloride infusion  500 mL Intravenous Once Sharyn Creamer, MD      ? ? ?Allergies as of 12/27/2021 - Review Complete 12/27/2021  ?Allergen Reaction Noted  ? Pramipexole Shortness Of Breath 11/09/2012  ? ? ?Family History  ?Problem Relation Age of Onset  ? Hypertension Mother   ? Glaucoma Mother   ? Diabetes Maternal Grandmother   ? Neuropathy Neg Hx   ? Colon cancer Neg Hx   ? Stomach cancer Neg Hx   ? Esophageal cancer Neg Hx   ? Colon polyps Neg Hx   ? Rectal cancer Neg Hx   ? ? ?Social History  ? ?Socioeconomic History  ? Marital status: Married  ?  Spouse name: Not on file  ? Number of children: 2  ? Years of education: Not on file  ? Highest education level: High school graduate  ?Occupational History  ? Occupation: Unemployed  ?Tobacco Use  ? Smoking status: Never  ?  Passive exposure: Never  ? Smokeless tobacco: Never  ?Vaping Use  ? Vaping Use: Never used  ?Substance and Sexual Activity  ? Alcohol use: No  ? Drug use: Never  ? Sexual activity: Yes  ?  Birth control/protection: None  ?Other Topics Concern  ? Not on file  ?Social History Narrative  ? Lives at home with her husband and children   ? Right handed  ? Caffeine: tea, 1 cup in the morning.  ? ?Social Determinants of Health  ? ?Financial Resource Strain: Not on file  ?Food Insecurity: Not on file  ?Transportation Needs: Not on file  ?Physical Act

## 2021-12-27 NOTE — Progress Notes (Signed)
Called to room to assist during endoscopic procedure.  Patient ID and intended procedure confirmed with present staff. Received instructions for my participation in the procedure from the performing physician.  

## 2021-12-27 NOTE — Progress Notes (Signed)
Pt non-responsive, VVS, Report to RN  °

## 2021-12-27 NOTE — Progress Notes (Signed)
Patient c/o n/v, reportedly vomited in the car on the way here and again once she arrived in the lobby. CRNA notified and CRNA administered Zofran '4mg'$  IVP. Patient then taken to procedure room by CRNA. ?

## 2021-12-27 NOTE — Op Note (Signed)
[place] ?Patient Name: [name] ?Procedure Date: 12/27/2021 12:40 PM ?MRN: [id number] ?Endoscopist: Sonny Masters "Christia Reading ,  ?Age: 36 ?Referring MD:  ?Date of Birth: [birth year] ?Gender: Female ?Account #: [id number] ?Procedure:                Colonoscopy ?Indications:              Anemia ?Medicines:                Monitored Anesthesia Care ?Procedure:                Pre-Anesthesia Assessment: ?                          - Prior to the procedure, a History and Physical  ?                          was performed, and patient medications and  ?                          allergies were reviewed. The patient's tolerance of  ?                          previous anesthesia was also reviewed. The risks  ?                          and benefits of the procedure and the sedation  ?                          options and risks were discussed with the patient.  ?                          All questions were answered, and informed consent  ?                          was obtained. Prior Anticoagulants: The patient has  ?                          taken no previous anticoagulant or antiplatelet  ?                          agents. ASA Grade Assessment: II - A patient with  ?                          mild systemic disease. After reviewing the risks  ?                          and benefits, the patient was deemed in  ?                          satisfactory condition to undergo the procedure. ?                          After obtaining informed consent, the colonoscope  ?  was passed under direct vision. Throughout the  ?                          procedure, the patient's blood pressure, pulse, and  ?                          oxygen saturations were monitored continuously. The  ?                          CF [id number] #[id number] was introduced through the anus  ?                          and advanced to the the terminal ileum. The  ?                          colonoscopy was performed without  difficulty. The  ?                          patient tolerated the procedure well. The quality  ?                          of the bowel preparation was good. The terminal  ?                          ileum, ileocecal valve, appendiceal orifice, and  ?                          rectum were photographed. ?Scope In: 1:56:01 PM ?Scope Out: 2:18:43 PM ?Scope Withdrawal Time: 0 hours 16 minutes 53 seconds  ?Total Procedure Duration: 0 hours 22 minutes 42 seconds  ?Findings:                 A localized area of mucosa in the terminal ileum  ?                          was nodular. Biopsies were taken with a cold  ?                          forceps for histology. ?                          Two sessile polyps were found in the transverse  ?                          colon and cecum. The polyps were 3 to 7 mm in size.  ?                          These polyps were removed with a cold snare.  ?                          Resection and retrieval were complete. ?                          A scattered area of mildly  erythematous mucosa was  ?                          found in the sigmoid colon, in the descending  ?                          colon, in the transverse colon and in the ascending  ?                          colon. This was biopsied with a cold forceps for  ?                          histology. ?                          Non-bleeding internal hemorrhoids were found during  ?                          retroflexion. ?Complications:            No immediate complications. ?Estimated Blood Loss:     Estimated blood loss was minimal. ?Impression:               - Nodular ileal mucosa. Biopsied. ?                          - Two 3 to 7 mm polyps in the transverse colon and  ?                          in the cecum, removed with a cold snare. Resected  ?                          and retrieved. ?                          - Erythematous mucosa in the sigmoid colon, in the  ?                          descending colon, in the transverse colon  and in  ?                          the ascending colon. Biopsied. ?                          - Non-bleeding internal hemorrhoids. ?Recommendation:           - Discharge patient to home (with escort). ?                          - Await pathology results. ?                          - The findings and recommendations were discussed  ?                          with the patient. ?                          -  Return to GI clinic in 1 month. ?Georgian Co,  ?12/27/2021 2:30:57 PM ?

## 2021-12-27 NOTE — Patient Instructions (Addendum)
Be sure to take your nexium 1/2 hour before breakfast and supper.  You're to take it for 8 weeks.  Keep your office visit appointment for June 12th.  If you can't make the appointment, call the office.  Read all of the handouts given to you by your recovery room nurse. ? ?YOU HAD AN ENDOSCOPIC PROCEDURE TODAY AT [facility]:   Refer to the procedure report that was given to you for any specific questions about what was found during the examination.  If the procedure report does not answer your questions, please call your gastroenterologist to clarify.  If you requested that your care partner not be given the details of your procedure findings, then the procedure report has been included in a sealed envelope for you to review at your convenience later. ? ?YOU SHOULD EXPECT: Some feelings of bloating in the abdomen. Passage of more gas than usual.  Walking can help get rid of the air that was put into your GI tract during the procedure and reduce the bloating. If you had a lower endoscopy (such as a colonoscopy or flexible sigmoidoscopy) you may notice spotting of blood in your stool or on the toilet paper. If you underwent a bowel prep for your procedure, you may not have a normal bowel movement for a few days. ? ?Please Note:  You might notice some irritation and congestion in your nose or some drainage.  This is from the oxygen used during your procedure.  There is no need for concern and it should clear up in a day or so. ? ?SYMPTOMS TO REPORT IMMEDIATELY: ? ?Following lower endoscopy (colonoscopy or flexible sigmoidoscopy): ? Excessive amounts of blood in the stool ? Significant tenderness or worsening of abdominal pains ? Swelling of the abdomen that is new, acute ? Fever of 100?F or higher ? ?Following upper endoscopy (EGD) ? Vomiting of blood or coffee ground material ? New chest pain or pain under the shoulder blades ? Painful or persistently difficult swallowing ? New shortness of  breath ? Fever of 100?F or higher ? Black, tarry-looking stools ? ?For urgent or emergent issues, a gastroenterologist can be reached at any hour by calling [phone]. ?Do not use MyChart messaging for urgent concerns.  ? ? ?DIET:  We do recommend a small meal at first, but then you may proceed to your regular diet.  Drink plenty of fluids but you should avoid alcoholic beverages for 24 hours. ? ?ACTIVITY:  You should plan to take it easy for the rest of today and you should NOT DRIVE or use heavy machinery until tomorrow (because of the sedation medicines used during the test).   ? ?FOLLOW UP: ?Our staff will call the number listed on your records 48-72 hours following your procedure to check on you and address any questions or concerns that you may have regarding the information given to you following your procedure. If we do not reach you, we will leave a message.  We will attempt to reach you two times.  During this call, we will ask if you have developed any symptoms of COVID 19. If you develop any symptoms (ie: fever, flu-like symptoms, shortness of breath, cough etc.) before then, please call [phone].  If you test positive for Covid 19 in the 2 weeks post procedure, please call and report this information to [place].   ? ?If any biopsies were taken you will be contacted by phone or by letter within the next 1-3 weeks.  Please call us at [phone] if you have not heard about the biopsies in 3 weeks.  ? ? ?SIGNATURES/CONFIDENTIALITY: ?You and/or your care partner have signed paperwork which will be entered into your electronic medical record.  These signatures attest to the fact that that the information above on your After Visit Summary has been reviewed and is understood.  Full responsibility of the confidentiality of this discharge information lies with you and/or your care-partner.  ?

## 2021-12-27 NOTE — Op Note (Signed)
[place] ?Patient Name: [name] ?Procedure Date: 12/27/2021 12:40 PM ?MRN: [id number] ?Endoscopist: Sonny Masters "Christia Reading ,  ?Age: 36 ?Referring MD:  ?Date of Birth: 1986/03/04 ?Gender: Female ?Account #: [id number] ?Procedure:                Upper GI endoscopy ?Indications:              Anemia, Nausea ?Medicines:                Monitored Anesthesia Care ?Procedure:                Pre-Anesthesia Assessment: ?                          - Prior to the procedure, a History and Physical  ?                          was performed, and patient medications and  ?                          allergies were reviewed. The patient's tolerance of  ?                          previous anesthesia was also reviewed. The risks  ?                          and benefits of the procedure and the sedation  ?                          options and risks were discussed with the patient.  ?                          All questions were answered, and informed consent  ?                          was obtained. Prior Anticoagulants: The patient has  ?                          taken no previous anticoagulant or antiplatelet  ?                          agents. ASA Grade Assessment: II - A patient with  ?                          mild systemic disease. After reviewing the risks  ?                          and benefits, the patient was deemed in  ?                          satisfactory condition to undergo the procedure. ?                          After obtaining informed consent, the endoscope was  ?  passed under direct vision. Throughout the  ?                          procedure, the patient's blood pressure, pulse, and  ?                          oxygen saturations were monitored continuously. The  ?                          Endoscope was introduced through the mouth, and  ?                          advanced to the third part of duodenum. The upper  ?                          GI endoscopy was  accomplished without difficulty.  ?                          The patient tolerated the procedure well. ?Scope In: ?Scope Out: ?Findings:                 The examined esophagus was normal. Biopsies were  ?                          taken with a cold forceps for histology. ?                          Localized moderate inflammation characterized by  ?                          congestion (edema), erosions and erythema was found  ?                          in the gastric antrum. Biopsies were taken with a  ?                          cold forceps for histology. ?                          The examined duodenum was normal. Biopsies were  ?                          taken with a cold forceps for histology. ?Complications:            No immediate complications. ?Estimated Blood Loss:     Estimated blood loss was minimal. ?Impression:               - Normal esophagus. Biopsied. ?                          - Gastritis. Biopsied. ?                          - Normal examined duodenum. Biopsied. ?Recommendation:           - Await pathology results. ?                          -  Use Nexium (esomeprazole) 40 mg PO BID for 8  ?                          weeks. ?                          - Perform a colonoscopy today. ?Georgian Co,  ?12/27/2021 2:23:26 PM ?

## 2021-12-31 ENCOUNTER — Other Ambulatory Visit: Payer: Self-pay

## 2021-12-31 ENCOUNTER — Telehealth: Payer: Self-pay | Admitting: *Deleted

## 2021-12-31 ENCOUNTER — Telehealth: Payer: Self-pay

## 2021-12-31 ENCOUNTER — Telehealth: Payer: Self-pay | Admitting: Family

## 2021-12-31 ENCOUNTER — Other Ambulatory Visit: Payer: Self-pay | Admitting: Family

## 2021-12-31 DIAGNOSIS — E1069 Type 1 diabetes mellitus with other specified complication: Secondary | ICD-10-CM

## 2021-12-31 MED ORDER — DEXCOM G6 TRANSMITTER MISC: 1.0000 | 5 refills | Status: DC

## 2021-12-31 NOTE — Telephone Encounter (Signed)
Pt states the "transmitter" has not been ordered and needs it ASAP. ? ?.. ?Encourage patient to contact the pharmacy for refills or they can request refills through [facility] ? ?LAST APPOINTMENT DATE:   ?11/28/21 ? ?NEXT APPOINTMENT DATE: ?02/28/22 ? ? ?MEDICATION: ?Continuous Blood Gluc TRANSMITTER (DEXCOM G6 TRANSMITTER) ? ? ?Is the patient out of medication?  ?Yes ? ? ?PHARMACY: ?[id number] [address] [id number] ?Phone:  [phone] Fax:  [phone] ? ? ?Let patient know to contact pharmacy at the end of the day to make sure medication is ready. ? ?Please notify patient to allow 48-72 hours to process  ?

## 2021-12-31 NOTE — Telephone Encounter (Signed)
Attempted to call patient for their post-procedure follow-up call. No answer. Left voicemail.   

## 2021-12-31 NOTE — Telephone Encounter (Signed)
?  Follow up Call- ? ? ?  12/27/2021  ?  1:04 PM  ?Call back number  ?Post procedure Call Back phone  # [phone]  ?Permission to leave phone message Yes  ?  ? ?Patient questions: ? ?Do you have a fever, pain , or abdominal swelling? No. ?Pain Score  0 * ? ?Have you tolerated food without any problems? Yes.   ? ?Have you been able to return to your normal activities? Yes.   ? ?Do you have any questions about your discharge instructions: ?Diet   No. ?Medications  No. ?Follow up visit  No. ? ?Do you have questions or concerns about your Care? No. ? ?Actions: ?* If pain score is 4 or above: ?No action needed, pain <4. ? ? ?

## 2021-12-31 NOTE — Telephone Encounter (Signed)
I originally sent one receiver and 2 sensors to use each month. Is a Transmitter the same as a sensor? I went ahead and sent 2 transmitters like I did for the sensor, may need to clarify with the pharmacy and then delete the sensors if that is the case. Thx

## 2022-01-01 ENCOUNTER — Other Ambulatory Visit: Payer: Self-pay | Admitting: Family

## 2022-01-01 ENCOUNTER — Encounter: Payer: Self-pay | Admitting: Internal Medicine

## 2022-01-01 DIAGNOSIS — E1065 Type 1 diabetes mellitus with hyperglycemia: Secondary | ICD-10-CM

## 2022-01-01 NOTE — Telephone Encounter (Signed)
Pt pharmacy stated they ordered the transmitter and the sensors were pick up yesterday and it is nothing else we need to do.  ?

## 2022-01-07 ENCOUNTER — Ambulatory Visit: Payer: Self-pay | Admitting: General Surgery

## 2022-01-07 ENCOUNTER — Telehealth: Payer: Self-pay | Admitting: Family

## 2022-01-07 ENCOUNTER — Other Ambulatory Visit: Payer: Self-pay | Admitting: Family

## 2022-01-07 DIAGNOSIS — G4709 Other insomnia: Secondary | ICD-10-CM

## 2022-01-07 MED ORDER — DOXEPIN HCL 3 MG PO TABS: 3.0000 mg | ORAL_TABLET | Freq: Every evening | ORAL | 2 refills | Status: DC | PRN

## 2022-01-07 NOTE — Telephone Encounter (Signed)
Pt gave a verbal understanding.

## 2022-01-07 NOTE — Telephone Encounter (Signed)
Sent another med, Doxepin, start with 1 pill for a few nights, then increase to 2 pills. Be sure to take at least 15mn-1hour prior to bedtime, if this doesn't work, she will need an office visit to discuss, thanks.

## 2022-01-07 NOTE — Telephone Encounter (Signed)
Pt states the sleep aid that was prescribed is not working. : eszopiclone (LUNESTA) 2 MG TABS tablet   Pt is asking if there is an option for a sleep aid with pain relief.   If a prescription can be written, she prefers:  [id number] [address] [place] Phone:  [phone] Fax:  [phone]

## 2022-01-07 NOTE — H&P (Signed)
REFERRING PHYSICIAN:  Loni Dolly*  PROVIDER:  Monico Blitz, MD  MRN: [id number] DOB: 10-28-1985 DATE OF ENCOUNTER: 01/07/2022  Subjective   Chief Complaint: Rectal Pain     History of Present Illness: [name] is a 36 y.o. female who is seen today as an office consultation at the request of Dr. Berline Lopes for evaluation of Rectal Pain .  Patient being evaluated for some lesions around the vaginal opening and towards the perianal area.  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  Patient has some chronic constipation on Linzess.  Recent colonoscopy showed no other abnormal findings.  Dr Charisse March plan is to proceed with exam under anesthesia with biopsies and laser ablation of lesions.     Review of Systems: A complete review of systems was obtained from the patient.  I have reviewed this information and discussed as appropriate with the patient.  See HPI as well for other ROS.   Medical History: Past Medical History:  Diagnosis Date   Diabetes mellitus without complication (CMS-HCC)     There is no problem list on file for this patient.   Past Surgical History:  Procedure Laterality Date   CESAREAN SECTION       No Known Allergies  Current Outpatient Medications on File Prior to Visit  Medication Sig Dispense Refill   omeprazole (PRILOSEC) 10 MG DR capsule Take 10 mg by mouth once daily     gabapentin (NEURONTIN) 300 MG capsule TAKE 1-2 CAPSULES BY MOUTH AT BEDTIME.     LEVEMIR FLEXPEN pen injector (concentration 100 units/mL)      LINZESS 290 mcg capsule      NOVOLOG FLEXPEN U-100 INSULIN pen injector (concentration 100 units/mL) INJECT 15-20 UNITS INTO THE SKIN 3 (THREE) TIMES DAILY WITH MEALS. PLUS SLIDING SCALE     No current facility-administered medications on file prior to visit.    History reviewed. No pertinent family history.   Social History   Tobacco Use  Smoking Status Never  Smokeless Tobacco Never      Social History   Socioeconomic History   Marital status: Married  Tobacco Use   Smoking status: Never   Smokeless tobacco: Never  Vaping Use   Vaping Use: Never used  Substance and Sexual Activity   Alcohol use: Yes   Sexual activity: Never    Objective:    Vitals:   01/07/22 1336  BP: 120/72  Pulse: 105  Temp: 36.7 C (98.1 F)  SpO2: 99%  Weight: 75.4 kg (166 lb 3.2 oz)  Height: 167.6 cm ('5\' 6"'$ )     Exam Gen: NAD Abd: soft CV: RRR Lungs: CTA Rectal: Small anterior skin tag, larger posterior skin tag.  Multiple lesions noted throughout the perianal area.   Labs, Imaging and Diagnostic Testing:  Procedure: Anoscopy Surgeon: Marcello Moores After the risks and benefits were explained, written consent was obtained for above procedure.  A medical assistant chaperone was present thoroughout the entire procedure.  Anesthesia: none Diagnosis: anal pain Findings: No internal hemorrhoid disease.  No obvious internal lesions noted.   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  We will plan on doing a joint procedure with Dr. Berline Lopes with exam under anesthesia and anoscopy.  Patient would like skin tags removed as well.  We discussed that this can add quite a bit of pain to the procedure and there is no guarantee that her pain will get better after surgery or that her skin tags will recur if she develops further hemorrhoids in the future.  Patient indicated that she understood this and still wishes to proceed with excision and laser ablation.  No follow-ups on file.   Caitlyn Adie, MD Colon and Rectal Surgery [facility]

## 2022-01-11 ENCOUNTER — Encounter: Payer: Self-pay | Admitting: Gynecologic Oncology

## 2022-01-18 ENCOUNTER — Other Ambulatory Visit: Payer: Self-pay | Admitting: Internal Medicine

## 2022-01-18 DIAGNOSIS — D649 Anemia, unspecified: Secondary | ICD-10-CM

## 2022-01-18 DIAGNOSIS — K259 Gastric ulcer, unspecified as acute or chronic, without hemorrhage or perforation: Secondary | ICD-10-CM

## 2022-01-25 ENCOUNTER — Telehealth: Payer: Self-pay

## 2022-01-25 NOTE — Telephone Encounter (Signed)
Spoke with patient regarding instructions for her upcoming procedure with Dr. Berline Lopes and Dr. Marcello Moores on 02/22/22. Pt states she has access to MyChart and will read the instructions when she is able.

## 2022-01-28 ENCOUNTER — Ambulatory Visit: Payer: BC Managed Care – PPO | Admitting: Internal Medicine

## 2022-02-05 ENCOUNTER — Encounter (HOSPITAL_COMMUNITY): Payer: Self-pay | Admitting: Emergency Medicine

## 2022-02-05 ENCOUNTER — Other Ambulatory Visit: Payer: Self-pay

## 2022-02-05 ENCOUNTER — Emergency Department (HOSPITAL_COMMUNITY)
Admission: EM | Admit: 2022-02-05 | Discharge: 2022-02-05 | Disposition: A | Discharge status: Home / Self Care | Payer: BC Managed Care – PPO | Attending: Emergency Medicine | Admitting: Emergency Medicine

## 2022-02-05 DIAGNOSIS — E1040 Type 1 diabetes mellitus with diabetic neuropathy, unspecified: Secondary | ICD-10-CM | POA: Insufficient documentation

## 2022-02-05 DIAGNOSIS — M79604 Pain in right leg: Secondary | ICD-10-CM

## 2022-02-05 DIAGNOSIS — Z794 Long term (current) use of insulin: Secondary | ICD-10-CM | POA: Diagnosis not present

## 2022-02-05 DIAGNOSIS — G629 Polyneuropathy, unspecified: Secondary | ICD-10-CM

## 2022-02-05 DIAGNOSIS — R200 Anesthesia of skin: Secondary | ICD-10-CM | POA: Diagnosis present

## 2022-02-05 MED ORDER — MORPHINE SULFATE 15 MG PO TABS: 15.0000 mg | ORAL_TABLET | Freq: Four times a day (QID) | ORAL | 0 refills | Status: DC | PRN

## 2022-02-05 MED ORDER — OXYCODONE-ACETAMINOPHEN 5-325 MG PO TABS
1.0000 | ORAL_TABLET | Freq: Once | ORAL | Status: AC
  Administered 2022-02-05: 1 via ORAL
  Filled 2022-02-05: qty 1

## 2022-02-05 MED ORDER — KETOROLAC TROMETHAMINE 15 MG/ML IJ SOLN
30.0000 mg | Freq: Once | INTRAMUSCULAR | Status: AC
  Administered 2022-02-05: 30 mg via INTRAMUSCULAR
  Filled 2022-02-05: qty 2

## 2022-02-05 MED ORDER — OXYCODONE HCL 5 MG PO TABS
10.0000 mg | ORAL_TABLET | Freq: Once | ORAL | Status: AC
  Administered 2022-02-05: 10 mg via ORAL
  Filled 2022-02-05: qty 2

## 2022-02-05 NOTE — ED Provider Notes (Signed)
[facility] EMERGENCY DEPARTMENT Provider Note   CSN: [id number] Arrival date & time: 02/05/22  0544     History  Chief Complaint  Patient presents with   Neuropathy    Legs Pain     [name] is a 36 y.o. female.  Patient with history of insulin-dependent diabetes, chronic pain due to diabetic neuropathy, followed by pain management in [place].  She receives monthly prescriptions for pain medication through her pain management clinic.  Most recently prescribed hydrocodone acetaminophen 10-325 mg #90.  Patient reports that she has been unable to fill the entire prescription due to ongoing shortage.  She has been without her pain medication for about 7 days.  She reports increasing numbness and tingling, pain, nausea and decreased appetite.  She has not noted any redness, swelling, or signs of infection in either foot.  Her pain management doctor did prescribe Nucynta several days ago, however she is waiting on authorization for this medication.  She has also been prescribed gabapentin which she states does not really help her much.      Home Medications Prior to Admission medications   Medication Sig Start Date End Date Taking? Authorizing Provider  BD INSULIN SYRINGE U/F 31G X 5/16" 1 ML MISC SMARTSIG:1 Each SUB-Q Morning-Night 08/10/21   [provider]  Continuous Blood Gluc Receiver ([place]) [place] 1 each by Does not apply route daily. Patient not taking: Reported on 12/27/2021 11/28/21   Jeanie Sewer, NP  Continuous Blood Gluc Sensor (DEXCOM G6 SENSOR) MISC 1 each by Does not apply route every 14 (fourteen) days. Patient not taking: Reported on 12/27/2021 11/28/21   Jeanie Sewer, NP  Continuous Blood Gluc Transmit (DEXCOM G6 TRANSMITTER) MISC Inject 1 each into the skin every 14 (fourteen) days. 12/31/21   Jeanie Sewer, NP  cyanocobalamin (,VITAMIN B-12,) 1000 MCG/ML injection Inject into the muscle. 07/16/21    [provider]  Doxepin HCl 3 MG TABS Take 1 tablet (3 mg total) by mouth at bedtime as needed. Try 1 pill for 2-3 nights, if not working, can increase to 2 pills qhs, max dose 2 pills 01/07/22   Jeanie Sewer, NP  esomeprazole (NEXIUM) 40 MG capsule TAKE 1 CAPSULE (40 MG TOTAL) BY MOUTH 2 (TWO) TIMES DAILY BEFORE A MEAL. 01/18/22   Sharyn Creamer, MD  furosemide (LASIX) 20 MG tablet Take 20 mg by mouth daily. 08/09/21   [provider]  gabapentin (NEURONTIN) 300 MG capsule Take 1-2 capsules (300-600 mg total) by mouth at bedtime. 09/10/21   Jeanie Sewer, NP  insulin aspart (NOVOLOG FLEXPEN) 100 UNIT/ML FlexPen Inject 15-20 Units into the skin 3 (three) times daily with meals. plus sliding scale 08/10/21 12/27/21  Jeanie Sewer, NP  insulin detemir (LEVEMIR FLEXTOUCH) 100 UNIT/ML FlexPen 50 UNITS AT BEDTIME, 40 UNITS EACH AM *D/C LEVEMIR VIALS 01/01/22   Jeanie Sewer, NP  Insulin Pen Needle (B-D UF III MINI PEN NEEDLES) 31G X 5 MM MISC To use with Insulin pens, as directed 11/20/21   Jeanie Sewer, NP  Insulin Syringes, Disposable, U-100 1 ML MISC Inject 1 each into the skin in the morning and at bedtime. 08/10/21   Jeanie Sewer, NP  linaclotide Rolan Lipa) 290 MCG CAPS capsule Take 1 capsule (290 mcg total) by mouth daily before breakfast. 11/26/21   Sharyn Creamer, MD  lisinopril (ZESTRIL) 2.5 MG tablet Take 1 tablet (2.5 mg total) by mouth daily. For kidney protection from diabetes. 11/28/21   Hudnell,  Colletta [place], NP  metroNIDAZOLE (FLAGYL) 500 MG tablet Take 500 mg by mouth 2 (two) times daily. 12/26/21   [provider]  nitrofurantoin (MACRODANTIN) 100 MG capsule Take 100 mg by mouth 2 (two) times daily. 12/26/21   [provider]  ondansetron (ZOFRAN) 4 MG tablet TAKE 1 TABLET BY MOUTH EVERY 8 HOURS AS NEEDED FOR NAUSEA AND VOMITING 12/28/21   Jeanie Sewer, NP  Oxycodone HCl 10 MG TABS Take 10 mg by mouth 3 (three) times daily as  needed. 07/18/21   [provider]  rosuvastatin (CRESTOR) 10 MG tablet Take 1 tablet (10 mg total) by mouth daily. Start 1 pill qod for 2 weeks, then 1 pill qd. 12/02/21   Jeanie Sewer, NP      Allergies    Pramipexole    Review of Systems   Review of Systems  Physical Exam Updated Vital Signs BP (!) 159/95   Pulse 77   Temp 98 F (36.7 C)   Resp 17   SpO2 99%  Physical Exam Vitals and nursing note reviewed.  Constitutional:      Appearance: She is well-developed.  HENT:     Head: Normocephalic and atraumatic.  Eyes:     Conjunctiva/sclera: Conjunctivae normal.  Pulmonary:     Effort: No respiratory distress.  Musculoskeletal:     Cervical back: Normal range of motion and neck supple.     Comments: Bilateral lower extremities, normal exam.  No lesions, sores, redness or swelling on the feet, ankles, calves.   Skin:    General: Skin is warm and dry.  Neurological:     Mental Status: She is alert.     Comments: Decree sensation on bilateral feet consistent with peripheral neuropathy.    ED Results / Procedures / Treatments   Labs (all labs ordered are listed, but only abnormal results are displayed) Labs Reviewed - No data to display  EKG None  Radiology No results found.  Procedures Procedures    Medications Ordered in ED Medications  oxyCODONE-acetaminophen (PERCOCET/ROXICET) 5-325 MG per tablet 1 tablet (1 tablet Oral Given 02/05/22 0610)  oxyCODONE (Oxy IR/ROXICODONE) immediate release tablet 10 mg (10 mg Oral Given 02/05/22 1226)  ketorolac (TORADOL) 15 MG/ML injection 30 mg (30 mg Intramuscular Given 02/05/22 1227)    ED Course/ Medical Decision Making/ A&P    Patient seen and examined. History obtained directly from patient.  I have also reviewed outside records including notes from pain management, recent visit in the past week.  Labs/EKG: None ordered  Imaging: None ordered  Medications/Fluids: Ordered: Oxycodone 10 mg x 1, IM  Toradol 30 mg.  Most recent vital signs reviewed and are as follows: BP 132/89 (BP Location: Right Arm)   Pulse 75   Temp 98 F (36.7 C)   Resp 18   SpO2 100%   Initial impression: Chronic pain due to diabetic neuropathy  Medications: Patient prescribed #8 MS IR '15mg'$ .  I discussed with the patient that she needs to be absolutely sure that filling his prescription will be okay with her pain management clinic.  She states that she will check.  These may be readily available.  She will continue her on gabapentin and start Nucynta when able.  Return instructions discussed with patient: Worsening pain, redness, swelling  Follow-up instructions discussed with patient: Follow-up with her pain management clinic as planned.  Medical Decision Making Risk Prescription drug management.   Patient presents for chronic bilateral lower extremity neuropathy.  Patient followed by pain management.  She has been without chronic pain medication over the past 1 week without significant withdrawals.  This is due to recent nationwide shortage per patient report.  Her reported history is consistent with recent pain management notes.  Treatment plan as above.  Lower extremity exam is normal other than reported decreased sensation consistent with neuropathy.  No signs of cellulitis or abscess.  No signs of DVT or arterial insufficiency.  The patient's vital signs, pertinent lab work and imaging were reviewed and interpreted as discussed in the ED course. Hospitalization was considered for further testing, treatments, or serial exams/observation. However as patient is well-appearing, has a stable exam, and reassuring studies today, I do not feel that they warrant admission at this time. This plan was discussed with the patient who verbalizes agreement and comfort with this plan and seems reliable and able to return to the Emergency Department with worsening or changing symptoms.           Final Clinical Impression(s) / ED Diagnoses Final diagnoses:  Pain in both lower extremities  Neuropathy    Rx / DC Orders ED Discharge Orders          Ordered    morphine (MSIR) 15 MG tablet  Every 6 hours PRN        02/05/22 1241              Carlisle Cater, PA-C 02/05/22 1247    Regan Lemming, MD 02/05/22 1430

## 2022-02-05 NOTE — ED Triage Notes (Signed)
Patient reports persistent bilateral,legs pain/neuropathy for several days , she ran out of her pain medications .

## 2022-02-05 NOTE — Discharge Instructions (Signed)
ForPleae follow-up with your pain management provider and continue other medications for your neuropathy.   I have prescribed a small amount of morphine tablets to take in the interim for severe pain, but please do not fill this until you are 100% sure your pain management group is okay with this, as you could have a pain contract that has strict rules about getting pain medication from other providers.   Return if you notice significant swelling, redness, or other changes of your extremities.

## 2022-02-05 NOTE — ED Provider Triage Note (Signed)
  Emergency Medicine Provider Triage Evaluation Note  MRN:  [id number]  Arrival date & time: 02/05/22    Medically screening exam initiated at 6:03 AM.   CC:   Leg Pain  HPI:  [name] is a 36 y.o. year-old female presents to the ED with chief complaint of chronic leg pain.  States she normally takes oxycodone for her diabetic neuropathy.  She states she has been out of the oxy.  She has been taking gabapentin.  Denies any injuries.  History provided by patient. ROS:  -As included in HPI PE:   Vitals:   02/05/22 0557  BP: 124/89  Pulse: 98  Resp: 18  Temp: 98 F (36.7 C)  SpO2: 100%    Non-toxic appearing No respiratory distress  MDM:   I've ordered a dose of oxy in triage to expedite lab/diagnostic workup.  Patient was informed that the remainder of the evaluation will be completed by another provider, this initial triage assessment does not replace that evaluation, and the importance of remaining in the ED until their evaluation is complete.    Montine Circle, PA-C 02/05/22 [phone]

## 2022-02-12 ENCOUNTER — Encounter (HOSPITAL_BASED_OUTPATIENT_CLINIC_OR_DEPARTMENT_OTHER): Payer: Self-pay | Admitting: General Surgery

## 2022-02-12 NOTE — Progress Notes (Signed)
Spoke w/ via phone for pre-op interview--- [name] needs dos----    UPT, ISTAT EKG           Garner results------ COVID test -----patient states asymptomatic no test needed Arrive at -------0530 NPO after MN NO Solid Food.   Med rec completed Medications to take morning of surgery ----- Zofran PRN Diabetic medication ----- 1/2 insulin dose night before, no insulin morning of surgery. Pt verbalized understanding. Patient instructed no nail polish to be worn day of surgery Patient instructed to bring photo id and insurance card day of surgery Patient aware to have Driver (ride ) / caregiver  Sister Kirtland Bouchard  for 24 hours after surgery  Patient Special Instructions ----- Pre-Op special Istructions ----- Patient verbalized understanding of instructions that were given at this phone interview. Patient denies shortness of breath, chest pain, fever, cough at this phone interview.

## 2022-02-21 ENCOUNTER — Other Ambulatory Visit: Payer: Self-pay | Admitting: Internal Medicine

## 2022-02-21 ENCOUNTER — Telehealth: Payer: Self-pay

## 2022-02-21 DIAGNOSIS — D649 Anemia, unspecified: Secondary | ICD-10-CM

## 2022-02-21 DIAGNOSIS — K259 Gastric ulcer, unspecified as acute or chronic, without hemorrhage or perforation: Secondary | ICD-10-CM

## 2022-02-21 NOTE — Telephone Encounter (Signed)
Attempted to reach patient to check in with her pre-operatively. Unable to reach patient. Left message requesting return call.  

## 2022-02-21 NOTE — Telephone Encounter (Signed)
Telephone call to check on pre-operative status.  Patient compliant with pre-operative instructions.  Reinforced nothing to eat after midnight. Clear liquids until 0430. Patient to arrive at 0530.  Patient knows to take 1/2 insulin dose tonight. Per pre-admission do not take lasix or vitamin b12 tonight. Patient verbalized understanding. No questions or concerns voiced.  Instructed to call for any needs.

## 2022-02-21 NOTE — Anesthesia Preprocedure Evaluation (Addendum)
Anesthesia Evaluation  Patient identified by MRN, date of birth, ID band Patient awake    Reviewed: Allergy & Precautions, NPO status , Patient's Chart, lab work & pertinent test results  History of Anesthesia Complications Negative for: history of anesthetic complications  Airway Mallampati: IV  TM Distance: >3 FB Neck ROM: Full  Mouth opening: Limited Mouth Opening  Dental  (+) Teeth Intact, Dental Advisory Given   Pulmonary neg pulmonary ROS,    Pulmonary exam normal        Cardiovascular negative cardio ROS Normal cardiovascular exam     Neuro/Psych negative neurological ROS     GI/Hepatic Neg liver ROS, GERD  ,  Endo/Other  diabetes, Poorly Controlled, Type 1, Insulin Dependent  Renal/GU negative Renal ROS  negative genitourinary   Musculoskeletal negative musculoskeletal ROS (+)   Abdominal   Peds  Hematology negative hematology ROS (+)   Anesthesia Other Findings   Reproductive/Obstetrics                            Anesthesia Physical Anesthesia Plan  ASA: 3  Anesthesia Plan: MAC   Post-op Pain Management: Tylenol PO (pre-op)* and Toradol IV (intra-op)*   Induction: Intravenous  PONV Risk Score and Plan: 2 and Propofol infusion, TIVA and Treatment may vary due to age or medical condition  Airway Management Planned: Natural Airway, Nasal Cannula and Simple Face Mask  Additional Equipment: None  Intra-op Plan:   Post-operative Plan:   Informed Consent: I have reviewed the patients History and Physical, chart, labs and discussed the procedure including the risks, benefits and alternatives for the proposed anesthesia with the patient or authorized representative who has indicated his/her understanding and acceptance.       Plan Discussed with:   Anesthesia Plan Comments:        Anesthesia Quick Evaluation

## 2022-02-22 ENCOUNTER — Ambulatory Visit (HOSPITAL_BASED_OUTPATIENT_CLINIC_OR_DEPARTMENT_OTHER)
Admission: RE | Admit: 2022-02-22 | Discharge: 2022-02-22 | Disposition: A | Discharge status: Home / Self Care | Payer: BC Managed Care – PPO | Attending: General Surgery | Admitting: General Surgery

## 2022-02-22 ENCOUNTER — Other Ambulatory Visit: Payer: Self-pay

## 2022-02-22 ENCOUNTER — Encounter (HOSPITAL_BASED_OUTPATIENT_CLINIC_OR_DEPARTMENT_OTHER)
Admission: RE | Disposition: A | Discharge status: Home / Self Care | Payer: Self-pay | Source: Home / Self Care | Attending: General Surgery

## 2022-02-22 ENCOUNTER — Encounter (HOSPITAL_BASED_OUTPATIENT_CLINIC_OR_DEPARTMENT_OTHER): Payer: Self-pay | Admitting: General Surgery

## 2022-02-22 ENCOUNTER — Ambulatory Visit (HOSPITAL_BASED_OUTPATIENT_CLINIC_OR_DEPARTMENT_OTHER): Payer: BC Managed Care – PPO | Admitting: Anesthesiology

## 2022-02-22 DIAGNOSIS — K219 Gastro-esophageal reflux disease without esophagitis: Secondary | ICD-10-CM | POA: Insufficient documentation

## 2022-02-22 DIAGNOSIS — Z01818 Encounter for other preprocedural examination: Secondary | ICD-10-CM

## 2022-02-22 DIAGNOSIS — A630 Anogenital (venereal) warts: Secondary | ICD-10-CM | POA: Insufficient documentation

## 2022-02-22 DIAGNOSIS — D071 Carcinoma in situ of vulva: Secondary | ICD-10-CM | POA: Insufficient documentation

## 2022-02-22 DIAGNOSIS — Z794 Long term (current) use of insulin: Secondary | ICD-10-CM | POA: Insufficient documentation

## 2022-02-22 DIAGNOSIS — N903 Dysplasia of vulva, unspecified: Secondary | ICD-10-CM | POA: Diagnosis not present

## 2022-02-22 DIAGNOSIS — E1065 Type 1 diabetes mellitus with hyperglycemia: Secondary | ICD-10-CM | POA: Diagnosis not present

## 2022-02-22 DIAGNOSIS — E1069 Type 1 diabetes mellitus with other specified complication: Secondary | ICD-10-CM

## 2022-02-22 DIAGNOSIS — K644 Residual hemorrhoidal skin tags: Secondary | ICD-10-CM | POA: Diagnosis present

## 2022-02-22 HISTORY — PX: EXCISION OF SKIN TAG: SHX6270

## 2022-02-22 HISTORY — PX: CO2 LASER APPLICATION: SHX5778

## 2022-02-22 LAB — POCT I-STAT, CHEM 8
BUN: 22 mg/dL — ABNORMAL HIGH (ref 6–20)
Calcium, Ion: 1.1 mmol/L — ABNORMAL LOW (ref 1.15–1.40)
Chloride: 101 mmol/L (ref 98–111)
Creatinine, Ser: 0.7 mg/dL (ref 0.44–1.00)
Glucose, Bld: 242 mg/dL — ABNORMAL HIGH (ref 70–99)
HCT: 38 % (ref 36.0–46.0)
Hemoglobin: 12.9 g/dL (ref 12.0–15.0)
Potassium: 4.5 mmol/L (ref 3.5–5.1)
Sodium: 136 mmol/L (ref 135–145)
TCO2: 26 mmol/L (ref 22–32)

## 2022-02-22 LAB — GLUCOSE, CAPILLARY: Glucose-Capillary: 255 mg/dL — ABNORMAL HIGH (ref 70–99)

## 2022-02-22 LAB — POCT PREGNANCY, URINE: Preg Test, Ur: NEGATIVE

## 2022-02-22 SURGERY — EXCISION, SKIN TAG
Anesthesia: Monitor Anesthesia Care | Site: Vulva

## 2022-02-22 MED ORDER — MIDAZOLAM HCL 2 MG/2ML IJ SOLN
INTRAMUSCULAR | Status: AC
  Filled 2022-02-22: qty 2

## 2022-02-22 MED ORDER — BUPIVACAINE-EPINEPHRINE 0.5% -1:200000 IJ SOLN
INTRAMUSCULAR | Status: DC | PRN
  Administered 2022-02-22: 30 mL

## 2022-02-22 MED ORDER — SENNA 8.6 MG PO TABS: 1.0000 | ORAL_TABLET | Freq: Every evening | ORAL | 1 refills | Status: DC | PRN

## 2022-02-22 MED ORDER — FENTANYL CITRATE (PF) 100 MCG/2ML IJ SOLN
INTRAMUSCULAR | Status: AC
  Filled 2022-02-22: qty 2

## 2022-02-22 MED ORDER — BUPIVACAINE LIPOSOME 1.3 % IJ SUSP
INTRAMUSCULAR | Status: DC | PRN
  Administered 2022-02-22: 20 mL

## 2022-02-22 MED ORDER — TRAMADOL HCL 50 MG PO TABS: 50.0000 mg | ORAL_TABLET | Freq: Four times a day (QID) | ORAL | 0 refills | Status: DC | PRN

## 2022-02-22 MED ORDER — SODIUM CHLORIDE 0.9% FLUSH: 3.0000 mL | Freq: Two times a day (BID) | INTRAVENOUS | Status: DC

## 2022-02-22 MED ORDER — PROPOFOL 10 MG/ML IV BOLUS
INTRAVENOUS | Status: DC | PRN
  Administered 2022-02-22 (×2): 20 mg via INTRAVENOUS

## 2022-02-22 MED ORDER — ACETAMINOPHEN 500 MG PO TABS
1000.0000 mg | ORAL_TABLET | Freq: Once | ORAL | Status: AC
  Administered 2022-02-22: 1000 mg via ORAL

## 2022-02-22 MED ORDER — 0.9 % SODIUM CHLORIDE (POUR BTL) OPTIME
TOPICAL | Status: DC | PRN
  Administered 2022-02-22: 1000 mL

## 2022-02-22 MED ORDER — LACTATED RINGERS IV SOLN: INTRAVENOUS | Status: DC

## 2022-02-22 MED ORDER — MIDAZOLAM HCL 2 MG/2ML IJ SOLN
INTRAMUSCULAR | Status: DC | PRN
  Administered 2022-02-22: 2 mg via INTRAVENOUS

## 2022-02-22 MED ORDER — KETAMINE HCL 50 MG/5ML IJ SOSY
PREFILLED_SYRINGE | INTRAMUSCULAR | Status: AC
  Filled 2022-02-22: qty 5

## 2022-02-22 MED ORDER — AMISULPRIDE (ANTIEMETIC) 5 MG/2ML IV SOLN: 10.0000 mg | Freq: Once | INTRAVENOUS | Status: DC | PRN

## 2022-02-22 MED ORDER — SILVER SULFADIAZINE 1 % EX CREA
TOPICAL_CREAM | CUTANEOUS | Status: DC | PRN
  Administered 2022-02-22: 1 via TOPICAL

## 2022-02-22 MED ORDER — FENTANYL CITRATE (PF) 100 MCG/2ML IJ SOLN: 25.0000 ug | INTRAMUSCULAR | Status: DC | PRN

## 2022-02-22 MED ORDER — SODIUM CHLORIDE 0.9% FLUSH: 3.0000 mL | INTRAVENOUS | Status: DC | PRN

## 2022-02-22 MED ORDER — SODIUM CHLORIDE 0.9 % IV SOLN: 250.0000 mL | INTRAVENOUS | Status: DC | PRN

## 2022-02-22 MED ORDER — KETAMINE HCL 10 MG/ML IJ SOLN
INTRAMUSCULAR | Status: DC | PRN
  Administered 2022-02-22 (×2): 5 mg via INTRAVENOUS

## 2022-02-22 MED ORDER — OXYCODONE HCL 5 MG PO TABS: 5.0000 mg | ORAL_TABLET | Freq: Once | ORAL | Status: DC | PRN

## 2022-02-22 MED ORDER — FENTANYL CITRATE (PF) 100 MCG/2ML IJ SOLN
INTRAMUSCULAR | Status: DC | PRN
  Administered 2022-02-22 (×4): 25 ug via INTRAVENOUS

## 2022-02-22 MED ORDER — PROPOFOL 500 MG/50ML IV EMUL
INTRAVENOUS | Status: DC | PRN
  Administered 2022-02-22: 150 ug/kg/min via INTRAVENOUS

## 2022-02-22 MED ORDER — ACETAMINOPHEN 325 MG RE SUPP: 650.0000 mg | RECTAL | Status: DC | PRN

## 2022-02-22 MED ORDER — BUPIVACAINE LIPOSOME 1.3 % IJ SUSP: 20.0000 mL | Freq: Once | INTRAMUSCULAR | Status: DC

## 2022-02-22 MED ORDER — ACETIC ACID 5 % SOLN
Status: DC | PRN
  Administered 2022-02-22: 1 via TOPICAL

## 2022-02-22 MED ORDER — LIDOCAINE HCL (CARDIAC) PF 100 MG/5ML IV SOSY
PREFILLED_SYRINGE | INTRAVENOUS | Status: DC | PRN
  Administered 2022-02-22: 40 mg via INTRAVENOUS

## 2022-02-22 MED ORDER — ACETAMINOPHEN 500 MG PO TABS
ORAL_TABLET | ORAL | Status: AC
  Filled 2022-02-22: qty 2

## 2022-02-22 MED ORDER — ONDANSETRON HCL 4 MG/2ML IJ SOLN: 4.0000 mg | Freq: Once | INTRAMUSCULAR | Status: DC | PRN

## 2022-02-22 MED ORDER — LIDOCAINE HCL 1 % IJ SOLN
INTRAMUSCULAR | Status: DC | PRN
  Administered 2022-02-22: 10 mL

## 2022-02-22 MED ORDER — OXYCODONE HCL 5 MG PO TABS: 5.0000 mg | ORAL_TABLET | ORAL | Status: DC | PRN

## 2022-02-22 MED ORDER — ONDANSETRON HCL 4 MG/2ML IJ SOLN
INTRAMUSCULAR | Status: DC | PRN
  Administered 2022-02-22: 4 mg via INTRAVENOUS

## 2022-02-22 MED ORDER — OXYCODONE HCL 5 MG/5ML PO SOLN: 5.0000 mg | Freq: Once | ORAL | Status: DC | PRN

## 2022-02-22 MED ORDER — ACETAMINOPHEN 325 MG PO TABS: 650.0000 mg | ORAL_TABLET | ORAL | Status: DC | PRN

## 2022-02-22 SURGICAL SUPPLY — 42 items
APL SKNCLS STERI-STRIP NONHPOA (GAUZE/BANDAGES/DRESSINGS) ×2
BENZOIN TINCTURE PRP APPL 2/3 (GAUZE/BANDAGES/DRESSINGS) ×1 IMPLANT
COVER BACK TABLE 60X90IN (DRAPES) ×3 IMPLANT
DECANTER SPIKE VIAL GLASS SM (MISCELLANEOUS) ×4 IMPLANT
DEPRESSOR TONGUE BLADE STERILE (MISCELLANEOUS) ×4 IMPLANT
DRAPE LAPAROTOMY 100X72 PEDS (DRAPES) ×3 IMPLANT
DRAPE UTILITY XL STRL (DRAPES) ×3 IMPLANT
DRSG PAD ABDOMINAL 8X10 ST (GAUZE/BANDAGES/DRESSINGS) ×3 IMPLANT
ELECT REM PT RETURN 9FT ADLT (ELECTROSURGICAL) ×3
ELECTRODE REM PT RTRN 9FT ADLT (ELECTROSURGICAL) ×2 IMPLANT
GAUZE SPONGE 4X4 12PLY STRL (GAUZE/BANDAGES/DRESSINGS) ×1 IMPLANT
GLOVE BIO SURGEON STRL SZ 6 (GLOVE) ×5 IMPLANT
GLOVE BIO SURGEON STRL SZ 6.5 (GLOVE) ×3 IMPLANT
GLOVE BIO SURGEON STRL SZ8 (GLOVE) ×1 IMPLANT
GLOVE BIOGEL PI IND STRL 6 (GLOVE) IMPLANT
GLOVE BIOGEL PI IND STRL 7.0 (GLOVE) ×2 IMPLANT
GLOVE BIOGEL PI IND STRL 8 (GLOVE) IMPLANT
GLOVE BIOGEL PI INDICATOR 6 (GLOVE) ×2
GLOVE BIOGEL PI INDICATOR 7.0 (GLOVE) ×2
GLOVE BIOGEL PI INDICATOR 8 (GLOVE) ×1
GLOVE INDICATOR 6.5 STRL GRN (GLOVE) ×3 IMPLANT
GOWN STRL REUS W/TWL 2XL LVL3 (GOWN DISPOSABLE) ×1 IMPLANT
GOWN STRL REUS W/TWL LRG LVL3 (GOWN DISPOSABLE) ×3 IMPLANT
GOWN STRL REUS W/TWL XL LVL3 (GOWN DISPOSABLE) ×3 IMPLANT
KIT TURNOVER CYSTO (KITS) ×6 IMPLANT
MANIFOLD NEPTUNE II (INSTRUMENTS) ×1 IMPLANT
NEEDLE HYPO 22GX1.5 SAFETY (NEEDLE) ×3 IMPLANT
PACK BASIN DAY SURGERY FS (CUSTOM PROCEDURE TRAY) ×3 IMPLANT
PACK VAGINAL WOMENS (CUSTOM PROCEDURE TRAY) ×3 IMPLANT
PAD PREP 24X48 CUFFED NSTRL (MISCELLANEOUS) ×3 IMPLANT
PANTS MESH DISP LRG (UNDERPADS AND DIAPERS) ×3 IMPLANT
SOL PREP POV-IOD 4OZ 10% (MISCELLANEOUS) ×1 IMPLANT
SUT CHROMIC 2 0 SH (SUTURE) ×3 IMPLANT
SUT VIC AB 2-0 SH 27 (SUTURE) ×3
SUT VIC AB 2-0 SH 27XBRD (SUTURE) IMPLANT
SWAB OB GYN 8IN STERILE 2PK (MISCELLANEOUS) ×6 IMPLANT
SYR CONTROL 10ML LL (SYRINGE) ×4 IMPLANT
TOWEL OR 17X26 10 PK STRL BLUE (TOWEL DISPOSABLE) ×7 IMPLANT
TRAY DSU PREP LF (CUSTOM PROCEDURE TRAY) ×3 IMPLANT
TUBE CONNECTING 12X1/4 (SUCTIONS) ×3 IMPLANT
VACUUM HOSE 7/8X10 W/ WAND (MISCELLANEOUS) ×3 IMPLANT
YANKAUER SUCT BULB TIP NO VENT (SUCTIONS) ×3 IMPLANT

## 2022-02-22 NOTE — Op Note (Addendum)
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 Two additional biopsies taken from the perineum. No other vulvar or vaginal lesions noted.   Procedure: The patient was identified in the preoperative holding area. Informed consent was signed on the chart. Patient was seen history was reviewed and exam was performed.   The patient was then taken to the operating room and placed in the supine position with SCD hose on. General anesthesia was then induced without difficulty. She was then placed in the dorsolithotomy position. The perineum was prepped with Betadine. The vagina was prepped with Betadine. The patient was then draped after the prep was dried.   Timeout was performed the patient, procedure, antibiotic, allergy, and length of procedure. The vulvar tissues were inspected for areas of leukoplakia. The lesions were identified and the bovie was used to circumscribe the area with appropriate surgical margins. The subcuticular tissues were infiltrated with 1% lidocaine. Two biopsies were taken as described above.  The patient's surgical field was draped with wet towels. The staff and patient ensured laser-safe eyewear and masks were fitted. The laser was set to 8 watts continuous. The laser was tested for accuracy on a tongue depressor.  The laser was applied to the circumscribed area of the vulva that had been previously identified. The tissue was ablated to the desired depth and the eschar was removed  with a moistened sponge. When the entire lesion had been ablated the procedure was complete. Bovie was used to achieve hemostasis. Given some bleeding from midportion of the perineum, a figure of eight stitch with 2-0 Vicryl was used with excellent hemostasis noted.  The procedure was then turned over to Dr. Marcello Moores for excision of anal skin tags.  Silvadine cream will applied to the laser site after completion of surgery.   Valarie Cones, MD

## 2022-02-22 NOTE — Transfer of Care (Signed)
Immediate Anesthesia Transfer of Care Note  Patient: [name]  Procedure(s) Performed: EXCISION OF ANAL SKIN TAGS CO2 LASER APPLICATION TO VULVA (Vulva)  Patient Location: PACU  Anesthesia Type:MAC and General  Level of Consciousness: awake, alert , oriented and patient cooperative  Airway & Oxygen Therapy: Patient Spontanous Breathing  Post-op Assessment: Report given to RN and Post -op Vital signs reviewed and stable  Post vital signs: Reviewed and stable  Last Vitals:  Vitals Value Taken Time  BP 143/81 02/22/22 0845  Temp    Pulse 109 02/22/22 0847  Resp 20 02/22/22 0847  SpO2 96 % 02/22/22 0847  Vitals shown include unvalidated device data.  Last Pain:  Vitals:   02/22/22 0603  TempSrc: Oral  PainSc: 0-No pain      Patients Stated Pain Goal: 5 (65/99/35 7017)  Complications: No notable events documented.

## 2022-02-22 NOTE — Op Note (Signed)
02/22/2022  7:38 AM  PATIENT:  [name]  36 y.o. female  Patient Care Team: Jeanie Sewer, NP as PCP - General (Family Medicine) Renato Shin, MD (Inactive) as Consulting Physician (Endocrinology)  PRE-OPERATIVE DIAGNOSIS:  ANAL SKIN TAGS, PERIANAL LESIONS  POST-OPERATIVE DIAGNOSIS:  ANAL SKIN TAGS, PERIANAL LESIONS  PROCEDURE:   EXCISION OF ANAL SKIN TAGS  CO2 LASER APPLICATION TO VULVA   Surgeon(s): Leighton Ruff, MD Lafonda Mosses, MD  ASSISTANT: Clerance Lav, MD   ANESTHESIA:   local and IV sedation  SPECIMEN:  Source of Specimen:  anterior and posterior skin tags  DISPOSITION OF SPECIMEN:  PATHOLOGY  COUNTS:  YES  PLAN OF CARE: Discharge to home after PACU  PATIENT DISPOSITION:  PACU - hemodynamically stable.  INDICATION: 35 y.o. F with VIN and anal skin tags   OR FINDINGS: anterior perianal lesions, anterior and posterior skin tags  DESCRIPTION: the patient was identified in the preoperative holding area and taken to the OR where they were laid on the operating room table.  MAC anesthesia was induced without difficulty. The patient was then positioned in Lithotomy position with buttocks gently taped apart.  The patient was then prepped and draped in usual sterile fashion.  SCDs were noted to be in place prior to the initiation of anesthesia. A surgical timeout was performed indicating the correct patient, procedure, positioning and need for preoperative antibiotics.  Dr. Berline Lopes then completed her portion of the procedure which will be dictated separately.  Once this was complete, I examined the perianal area.  A rectal block was performed using Marcaine with epinephrine mixed with Exparel.    I began with a digital rectal exam.  There were no internal lesions noted.  I then placed a Hill-Ferguson anoscope into the anal canal and evaluated this completely.  There were no internal hemorrhoids noted.  I placed an acetic acid soaked  sponge into the internal anal canal and allowed this to sit for approximately 90 seconds.  There were no internal acetowhite lesions noted.  Once this was complete, the anterior skin tag was removed using Metzenbaum scissors.  The edges of the anoderm were reapproximated using interrupted 2-0 chromic sutures.  The posterior skin tag was removed in similar fashion.  This was closed horizontally using interrupted 2-0 chromic sutures as well.  Hemostasis was good.  Additional Marcaine mixed with Exparel was placed around the incisions for postoperative pain control.  Silvadene ointment was applied to the laser ablated regions of the perineum.  A dressing was applied.  The patient was then awakened from anesthesia and sent to the postanesthesia care unit in stable condition.  All counts were correct per operating room staff.  Rosario Adie, MD  Colorectal and General Surgery [facility]    I was personally present during the key and critical portions of this procedure and immediately available throughout the entire procedure, as documented in my operative note.

## 2022-02-22 NOTE — Discharge Instructions (Addendum)
02/22/2022 - Discharge instructions from vulvar laser portion of your surgery  Return to work: when ready, can be as early as next week  Activity: 1. Be up and out of the bed during the day.  Take a nap if needed.    2. No lifting or straining for 6 weeks.  3. Do Not drive if you are taking narcotic pain medicine.  4. Shower daily.  You can do a Sitz baths up to 3 times a day for 10-15 minutes to provide some relief to your perineum (bottom area)  5. No sexual activity and nothing in the vagina for 6 weeks.  Medications:  - Take ibuprofen and tylenol first line for pain control. Take these regularly (every 6 hours) to decrease the build up of pain.  - If necessary, for severe pain not relieved by ibuprofen, take tramadol.  - While taking tramadol you should take sennakot every night to reduce the likelihood of constipation. If this causes diarrhea, stop its use.  Diet: 1. Low sodium Heart Healthy Diet is recommended.  2. It is safe to use a laxative if you have difficulty moving your bowels.   Wound Care: 1. Keep clean and dry.    Reasons to call the Doctor:  Fever - Oral temperature greater than 100.4 degrees Fahrenheit Foul-smelling vaginal discharge Difficulty urinating Nausea and vomiting Increased pain at the site of the incision that is unrelieved with pain medicine. Difficulty breathing with or without chest pain New calf pain especially if only on one side Sudden, continuing increased vaginal bleeding with or without clots.   Follow-up: 1. See Jeral Pinch on 8/14.  Contacts: For questions or concerns you should contact:  Dr. Jeral Pinch at [phone] After hours and on week-ends call [phone] and ask to speak to the physician on call for Gynecologic Oncology     ANORECTAL SURGERY: POST OP INSTRUCTIONS Take your usually prescribed home medications unless otherwise directed. DIET: During the first few hours after surgery sip on some  liquids until you are able to urinate.  It is normal to not urinate for several hours after this surgery.  If you feel uncomfortable, please contact the office for instructions.  After you are able to urinate,you may eat, if you feel like it.  Follow a light bland diet the first 24 hours after arrival home, such as soup, liquids, crackers, etc.  Be sure to include lots of fluids daily (6-8 glasses).  Avoid fast food or heavy meals, as your are more likely to get nauseated.  Eat a low fat diet the next few days after surgery.  Limit caffeine intake to 1-2 servings a day. PAIN CONTROL: Pain is best controlled by a usual combination of several different methods TOGETHER: Muscle relaxation: Soak in a warm bath (or Sitz bath) three times a day and after bowel movements.  Continue to do this until all pain is resolved. Over the counter pain medication Prescription pain medication Most patients will experience some swelling and discomfort in the anus/rectal area and incisions.  Heat such as warm towels, sitz baths, warm baths, etc to help relax tight/sore spots and speed recovery.  Some people prefer to use ice, especially in the first couple days after surgery, as it may decrease the pain and swelling, or alternate between ice & heat.  Experiment to what works for you.  Swelling and bruising can take several weeks to resolve.  Pain can take even longer to completely resolve. It  is helpful to take an over-the-counter pain medication regularly for the first few weeks.  Choose one of the following that works best for you: Naproxen (Aleve, etc)  Two '220mg'$  tabs twice a day Ibuprofen (Advil, etc) Three '200mg'$  tabs four times a day (every meal & bedtime) A  prescription for pain medication (such as percocet, oxycodone, hydrocodone, etc) should be given to you upon discharge.  Take your pain medication as prescribed.  If you are having problems/concerns with the prescription medicine (does not control pain, nausea,  vomiting, rash, itching, etc), please call us [phone] to see if we need to switch you to a different pain medicine that will work better for you and/or control your side effect better. If you need a refill on your pain medication, please contact your pharmacy.  They will contact our office to request authorization. Prescriptions will not be filled after 5 pm or on week-ends. KEEP YOUR BOWELS REGULAR and AVOID CONSTIPATION The goal is one to two soft bowel movements a day.  You should at least have a bowel movement every other day. Avoid getting constipated.  Between the surgery and the pain medications, it is common to experience some constipation. This can be very painful after rectal surgery.  Increasing fluid intake and taking a fiber supplement (such as Metamucil, Citrucel, FiberCon, etc) 1-2 times a day regularly will usually help prevent this problem from occurring.  A stool softener like colace is also recommended.  This can be purchased over the counter at your pharmacy.  You can take it up to 3 times a day.  If you do not have a bowel movement after 24 hrs since your surgery, take one does of milk of magnesia.  If you still haven't had a bowel movement 8-12 hours after that dose, take another dose.  If you don't have a bowel movement 48 hrs after surgery, purchase a Fleets enema from the drug store and administer gently per package instructions.  If you still are having trouble with your bowel movements after that, please call the office for further instructions. If you develop diarrhea or have many loose bowel movements, simplify your diet to bland foods & liquids for a few days.  Stop any stool softeners and decrease your fiber supplement.  Switching to mild anti-diarrheal medications (Kayopectate, Pepto Bismol) can help.  If this worsens or does not improve, please call us.  Wound Care Remove your bandages before your first bowel movement or 8 hours after surgery.     Remove any wound  packing material at this tim,e as well.  You do not need to repack the wound unless instructed otherwise.  Wear an absorbent pad or soft cotton gauze in your underwear to catch any drainage and help keep the area clean. You should change this every 2-3 hours while awake. Keep the area clean and dry.  Bathe / shower every day, especially after bowel movements.  Keep the area clean by showering / bathing over the incision / wound.   It is okay to soak an open wound to help wash it.  Wet wipes or showers / gentle washing after bowel movements is often less traumatic than regular toilet paper. You may have some styrofoam-like soft packing in the rectum which will come out with the first bowel movement.  You will often notice bleeding with bowel movements.  This should slow down by the end of the first week of surgery Expect some drainage.  This should slow down, too,  by the end of the first week of surgery.  Wear an absorbent pad or soft cotton gauze in your underwear until the drainage stops. Do Not sit on a rubber or pillow ring.  This can make you symptoms worse.  You may sit on a soft pillow if needed.  ACTIVITIES as tolerated:   You may resume regular (light) daily activities beginning the next day--such as daily self-care, walking, climbing stairs--gradually increasing activities as tolerated.  If you can walk 30 minutes without difficulty, it is safe to try more intense activity such as jogging, treadmill, bicycling, low-impact aerobics, swimming, etc. Save the most intensive and strenuous activity for last such as sit-ups, heavy lifting, contact sports, etc  Refrain from any heavy lifting or straining until you are off narcotics for pain control.   You may drive when you are no longer taking prescription pain medication, you can comfortably sit for long periods of time, and you can safely maneuver your car and apply brakes. You may have sexual intercourse when it is comfortable.  FOLLOW UP in our  office Please call [facility] at [phone] to set up an appointment to see your surgeon in the office for a follow-up appointment approximately 3-4 weeks after your surgery. Make sure that you call for this appointment the day you arrive home to insure a convenient appointment time. 10. IF YOU HAVE DISABILITY OR FAMILY LEAVE FORMS, BRING THEM TO THE OFFICE FOR PROCESSING.  DO NOT GIVE THEM TO YOUR DOCTOR.     WHEN TO CALL us [phone]: Poor pain control Reactions / problems with new medications (rash/itching, nausea, etc)  Fever over 101.5 F (38.5 C) Inability to urinate Nausea and/or vomiting Worsening swelling or bruising Continued bleeding from incision. Increased pain, redness, or drainage from the incision  The clinic staff is available to answer your questions during regular business hours (8:30am-5pm).  Please don't hesitate to call and ask to speak to one of our nurses for clinical concerns.   A surgeon from [facility] is always on call at the hospitals   If you have a medical emergency, go to the nearest emergency room or call 911.    [facility], [place], [place], [place], [place]  [address] ? MAIN: [phone] ? TOLL FREE: [phone] ? FAX [phone] [email or web address]      Post Anesthesia Home Care Instructions  Activity: Get plenty of rest for the remainder of the day. A responsible individual must stay with you for 24 hours following the procedure.  For the next 24 hours, DO NOT: -Drive a car -Paediatric nurse -Drink alcoholic beverages -Take any medication unless instructed by your physician -Make any legal decisions or sign important papers.  Meals: Start with liquid foods such as gelatin or soup. Progress to regular foods as tolerated. Avoid greasy, spicy, heavy foods. If nausea and/or vomiting occur, drink only clear liquids until the nausea and/or vomiting subsides. Call your physician  if vomiting continues.  Special Instructions/Symptoms: Your throat may feel dry or sore from the anesthesia or the breathing tube placed in your throat during surgery. If this causes discomfort, gargle with warm salt water. The discomfort should disappear within 24 hours.  Do not take any Tylenol until after 112:00 pm today if needed.     Information for Discharge Teaching: EXPAREL (bupivacaine liposome injectable suspension)   Your surgeon or anesthesiologist gave you EXPAREL(bupivacaine) to help control your pain after surgery.  EXPAREL is a local  anesthetic that provides pain relief by numbing the tissue around the surgical site. EXPAREL is designed to release pain medication over time and can control pain for up to 72 hours. Depending on how you respond to EXPAREL, you may require less pain medication during your recovery.  Possible side effects: Temporary loss of sensation or ability to move in the area where bupivacaine was injected. Nausea, vomiting, constipation Rarely, numbness and tingling in your mouth or lips, lightheadedness, or anxiety may occur. Call your doctor right away if you think you may be experiencing any of these sensations, or if you have other questions regarding possible side effects.  Follow all other discharge instructions given to you by your surgeon or nurse. Eat a healthy diet and drink plenty of water or other fluids.  If you return to the hospital for any reason within 96 hours (Tuesday 02/26/22)  following the administration of EXPAREL, it is important for health care providers to know that you have received this anesthetic. A teal colored band has been placed on your arm with the date, time and amount of EXPAREL you have received in order to alert and inform your health care providers. Please leave this armband in place for the full 96 hours following administration, and then you may remove the band.

## 2022-02-22 NOTE — Anesthesia Postprocedure Evaluation (Signed)
Anesthesia Post Note  Patient: [name]  Procedure(s) Performed: EXCISION OF ANAL SKIN TAGS CO2 LASER APPLICATION TO VULVA (Vulva)     Patient location during evaluation: PACU Anesthesia Type: MAC Level of consciousness: awake and alert Pain management: pain level controlled Vital Signs Assessment: post-procedure vital signs reviewed and stable Respiratory status: spontaneous breathing, nonlabored ventilation and respiratory function stable Cardiovascular status: blood pressure returned to baseline and stable Postop Assessment: no apparent nausea or vomiting Anesthetic complications: no   No notable events documented.  Last Vitals:  Vitals:   02/22/22 0920 02/22/22 1000  BP: (!) 136/99 (!) 145/98  Pulse: (!) 103 99  Resp: 18 16  Temp:  36.7 C  SpO2: 98% 99%    Last Pain:  Vitals:   02/22/22 1000  TempSrc:   PainSc: 0-No pain                 Lidia Collum

## 2022-02-22 NOTE — Treatment Plan (Signed)
Called patient's contact, sister Crystal, no answer. Left VM letting her know I was finished with my portion of the surgery that that things had gone well.  Jeral Pinch MD Gynecologic Oncology

## 2022-02-22 NOTE — H&P (Signed)
Gynecologic Oncology H&P  02/22/22  Treatment History: Patient initially was seen last summer in July with "vaginal bumps" and pruritus.  She thought initially that she had a yeast infection.  Shortly after that time, she felt "bumps" on the backside of the vaginal opening.  She went to urgent care and given symptoms and findings on exam, patient was treated for yeast infection with Diflucan.  She denies any improvement in her symptoms.  She went back to urgent care in September with similar vaginal symptoms as well as some urinary symptoms.  She was treated presumptively for UTI and was treated again for Candida infection and for BV.  She ultimately was seen by OB/GYN in March.  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   She continues to have vulvar and perianal pruritus.  She has some pain related to pruritus on or near her hemorrhoids.  She denies any discharge or bleeding.  She endorses a good appetite.  She has a history of constipation, is currently on Linzess.  She is seeing gastroenterology soon and has a colonoscopy on 5/11.  She denies any urinary symptoms currently.  Interval History: Doing well. Continues to have some pruritus and pain related to area on her perineum.   Past Medical/Surgical History: Past Medical History:  Diagnosis Date   Chronic constipation 11/08/2021   Diabetic neuropathy ([place])    History of chicken pox    Type 1 diabetes ([place])    Vaginal odor 11/08/2021    Past Surgical History:  Procedure Laterality Date   CESAREAN SECTION     only one C/S per pt.   DILATION AND CURETTAGE OF UTERUS      Family History  Problem Relation Age of Onset   Hypertension Mother    Glaucoma Mother    Diabetes Maternal Grandmother    Neuropathy Neg Hx    Colon cancer Neg Hx    Stomach cancer Neg Hx    Esophageal cancer Neg Hx    Colon polyps Neg Hx    Rectal cancer Neg Hx     Social History   Socioeconomic  History   Marital status: Married    Spouse name: Not on file   Number of children: 2   Years of education: Not on file   Highest education level: High school graduate  Occupational History   Occupation: Unemployed  Tobacco Use   Smoking status: Never    Passive exposure: Never   Smokeless tobacco: Never  Vaping Use   Vaping Use: Never used  Substance and Sexual Activity   Alcohol use: No   Drug use: Never   Sexual activity: Yes    Birth control/protection: None  Other Topics Concern   Not on file  Social History Narrative   Lives at home with her husband and children   Right handed   Caffeine: tea, 1 cup in the morning.   Social Determinants of Health   Financial Resource Strain: Not on file  Food Insecurity: Not on file  Transportation Needs: Not on file  Physical Activity: Not on file  Stress: Not on file  Social Connections: Not on file    Current Medications:  Current Facility-Administered Medications:    bupivacaine liposome (EXPAREL) 1.3 % injection 266 mg, 20 mL, Infiltration, Once, Leighton Ruff, MD   lactated ringers infusion, , Intravenous, Continuous, Josephine Igo, MD, Last Rate: 10 mL/hr at 02/22/22 0616, New Bag at 02/22/22 0616  Review of Systems: + Constipation, menstrual  problems Denies appetite changes, fevers, chills, fatigue, unexplained weight changes. Denies hearing loss, neck lumps or masses, mouth sores, ringing in ears or voice changes. Denies cough or wheezing.  Denies shortness of breath. Denies chest pain or palpitations. Denies leg swelling. Denies abdominal distention, pain, blood in stools, diarrhea, nausea, vomiting, or early satiety. Denies pain with intercourse, dysuria, frequency, hematuria or incontinence. Denies hot flashes, pelvic pain, vaginal bleeding or vaginal discharge.   Denies joint pain, back pain or muscle pain/cramps. Denies itching, rash, or wounds. Denies dizziness, headaches, numbness or seizures. Denies  swollen lymph nodes or glands, denies easy bruising or bleeding. Denies anxiety, depression, confusion, or decreased concentration.  Physical Exam: BP (!) 135/93   Pulse 93   Temp 98.1 F (36.7 C) (Oral)   Resp 16   Ht '5\' 6"'$  (1.676 m)   Wt 172 lb 9.6 oz (78.3 kg)   LMP 02/20/2022 (Exact Date)   SpO2 97%   BMI 27.86 kg/m  General: Alert, oriented, no acute distress.  HEENT: Normocephalic, atraumatic. Sclera anicteric.  Chest: Clear to auscultation bilaterally. No wheezes, rhonchi, or rales. Cardiovascular: Regular rate and rhythm, no murmurs, rubs, or gallops.  Abdomen: Normoactive bowel sounds. Soft, nondistended, nontender to palpation. No masses or hepatosplenomegaly appreciated. No palpable fluid wave.  Extremities: Grossly normal range of motion. Warm, well perfused. No edema bilaterally.  Skin: No rashes or lesions.  Laboratory & Radiologic Studies: CBC    Component Value Date/Time   WBC 11.9 (H) 02/17/2021 0427   RBC 4.17 02/17/2021 0427   HGB 12.9 02/22/2022 0622   HGB 12.7 10/27/2019 1133   HCT 38.0 02/22/2022 0622   HCT 38.0 10/27/2019 1133   PLT 282 02/17/2021 0427   PLT 271 10/27/2019 1133   MCV 85.9 02/17/2021 0427   MCV 91 10/27/2019 1133   MCH 28.5 02/17/2021 0427   MCHC 33.2 02/17/2021 0427   RDW 14.9 02/17/2021 0427   RDW 12.6 10/27/2019 1133   LYMPHSABS 2.3 05/04/2018 0920   MONOABS 0.5 05/04/2018 0920   EOSABS 0.2 05/04/2018 0920   BASOSABS 0.1 05/04/2018 0920   Assessment & Plan: [name] is a 36 y.o. woman with high-grade vulvar and peri-anal dysplasia.  Plan for laser ablation of biopsy proven high-grade vulvar dysplasia, HPV related.  Jeral Pinch, MD  Division of Gynecologic Oncology  Department of Obstetrics and Gynecology  [facility]

## 2022-02-22 NOTE — H&P (Signed)
REFERRING PHYSICIAN:  Loni Dolly*   PROVIDER:  Monico Blitz, MD   MRN: [id number] DOB: [birth year] DATE OF ENCOUNTER: 01/07/2022   Subjective    Chief Complaint: Rectal Pain       History of Present Illness: [name] is a 36 y.o. female who is seen today as an office consultation at the request of Dr. Berline Lopes for evaluation of Rectal Pain .  Patient being evaluated for some lesions around the vaginal opening and towards the perianal area.  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  Patient has some chronic constipation on Linzess.  Recent colonoscopy showed no other abnormal findings.  Dr Charisse March plan is to proceed with exam under anesthesia with biopsies and laser ablation of lesions.         Review of Systems: A complete review of systems was obtained from the patient.  I have reviewed this information and discussed as appropriate with the patient.  See HPI as well for other ROS.     Medical History:     Past Medical History:  Diagnosis Date   Diabetes mellitus without complication (CMS-HCC)        There is no problem list on file for this patient.          Past Surgical History:  Procedure Laterality Date   CESAREAN SECTION          No Known Allergies         Current Outpatient Medications on File Prior to Visit  Medication Sig Dispense Refill   omeprazole (PRILOSEC) 10 MG DR capsule Take 10 mg by mouth once daily       gabapentin (NEURONTIN) 300 MG capsule TAKE 1-2 CAPSULES BY MOUTH AT BEDTIME.       LEVEMIR FLEXPEN pen injector (concentration 100 units/mL)         LINZESS 290 mcg capsule         NOVOLOG FLEXPEN U-100 INSULIN pen injector (concentration 100 units/mL) INJECT 15-20 UNITS INTO THE SKIN 3 (THREE) TIMES DAILY WITH MEALS. PLUS SLIDING SCALE        No current facility-administered medications on file prior to visit.      History reviewed. No pertinent family history.    Social History       Tobacco  Use  Smoking Status Never  Smokeless Tobacco Never      Social History        Socioeconomic History   Marital status: Married  Tobacco Use   Smoking status: Never   Smokeless tobacco: Never  Vaping Use   Vaping Use: Never used  Substance and Sexual Activity   Alcohol use: Yes   Sexual activity: Never      Objective:      Vitals:   02/22/22 0603  BP: (!) 135/93  Pulse: 93  Resp: 16  Temp: 98.1 F (36.7 C)  SpO2: 97%    Exam Gen: NAD Abd: soft CV: RRR Lungs: CTA Rectal: Small anterior skin tag, larger posterior skin tag.  Multiple lesions noted throughout the perianal area.     Labs, Imaging and Diagnostic Testing:   Procedure: Anoscopy Surgeon: Marcello Moores After the risks and benefits were explained, written consent was obtained for above procedure.  A medical assistant chaperone was present thoroughout the entire procedure.  Anesthesia: none Diagnosis: anal pain Findings: No internal hemorrhoid disease.  No obvious internal lesions noted.     Assessment and Plan:  Diagnoses and all orders for this visit:  Anal pain -     lidocaine (XYLOCAINE) 2 % jelly; Apply topically as needed   Anal skin tag     36 year old female with diabetes and posterior VIN.  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  We will plan on doing a joint procedure with Dr. Berline Lopes with exam under anesthesia and anoscopy.  Patient would like skin tags removed as well.  We discussed that this can add quite a bit of pain to the procedure and there is no guarantee that her pain will get better after surgery or that her skin tags will recur if she develops further hemorrhoids in the future.  Patient indicated that she understood this and still wishes to proceed with excision and laser ablation.   No follow-ups on file.    Rosario Adie, MD Colon and Rectal Surgery [facility] : [facility]

## 2022-02-25 ENCOUNTER — Encounter (HOSPITAL_BASED_OUTPATIENT_CLINIC_OR_DEPARTMENT_OTHER): Payer: Self-pay | Admitting: General Surgery

## 2022-02-25 ENCOUNTER — Other Ambulatory Visit: Payer: Self-pay

## 2022-02-25 ENCOUNTER — Emergency Department (HOSPITAL_BASED_OUTPATIENT_CLINIC_OR_DEPARTMENT_OTHER)
Admission: EM | Admit: 2022-02-25 | Discharge: 2022-02-25 | Disposition: A | Discharge status: Home / Self Care | Payer: BC Managed Care – PPO | Attending: Emergency Medicine | Admitting: Emergency Medicine

## 2022-02-25 DIAGNOSIS — Z794 Long term (current) use of insulin: Secondary | ICD-10-CM | POA: Diagnosis not present

## 2022-02-25 DIAGNOSIS — G8918 Other acute postprocedural pain: Secondary | ICD-10-CM

## 2022-02-25 DIAGNOSIS — R339 Retention of urine, unspecified: Secondary | ICD-10-CM | POA: Diagnosis not present

## 2022-02-25 LAB — URINALYSIS, ROUTINE W REFLEX MICROSCOPIC
Bilirubin Urine: NEGATIVE
Glucose, UA: NEGATIVE mg/dL
Hgb urine dipstick: NEGATIVE
Ketones, ur: NEGATIVE mg/dL
Leukocytes,Ua: NEGATIVE
Nitrite: NEGATIVE
Protein, ur: NEGATIVE mg/dL
Specific Gravity, Urine: 1.017 (ref 1.005–1.030)
pH: 5.5 (ref 5.0–8.0)

## 2022-02-25 LAB — PREGNANCY, URINE: Preg Test, Ur: NEGATIVE

## 2022-02-25 LAB — BASIC METABOLIC PANEL
Anion gap: 11 (ref 5–15)
BUN: 19 mg/dL (ref 6–20)
CO2: 30 mmol/L (ref 22–32)
Calcium: 9.7 mg/dL (ref 8.9–10.3)
Chloride: 96 mmol/L — ABNORMAL LOW (ref 98–111)
Creatinine, Ser: 0.78 mg/dL (ref 0.44–1.00)
GFR, Estimated: >60 mL/min (ref >60)
Glucose, Bld: 226 mg/dL — ABNORMAL HIGH (ref 70–99)
Potassium: 4.3 mmol/L (ref 3.5–5.1)
Sodium: 137 mmol/L (ref 135–145)

## 2022-02-25 LAB — CBC
HCT: 35.7 % — ABNORMAL LOW (ref 36.0–46.0)
Hemoglobin: 11.9 g/dL — ABNORMAL LOW (ref 12.0–15.0)
MCH: 30.1 pg (ref 26.0–34.0)
MCHC: 33.3 g/dL (ref 30.0–36.0)
MCV: 90.2 fL (ref 80.0–100.0)
Platelets: 258 10*3/uL (ref 150–400)
RBC: 3.96 MIL/uL (ref 3.87–5.11)
RDW: 14 % (ref 11.5–15.5)
WBC: 5.7 10*3/uL (ref 4.0–10.5)
nRBC: 0 % (ref 0.0–0.2)

## 2022-02-25 LAB — SURGICAL PATHOLOGY

## 2022-02-25 MED ORDER — OXYCODONE-ACETAMINOPHEN 5-325 MG PO TABS: 1.0000 | ORAL_TABLET | Freq: Four times a day (QID) | ORAL | 0 refills | Status: DC | PRN

## 2022-02-25 MED ORDER — OXYCODONE-ACETAMINOPHEN 5-325 MG PO TABS
2.0000 | ORAL_TABLET | Freq: Once | ORAL | Status: AC
  Administered 2022-02-25: 2 via ORAL
  Filled 2022-02-25: qty 2

## 2022-02-25 NOTE — ED Notes (Signed)
14 French Indwelling Foley Catheter inserted by This RN with Assistance from [place] NT. Patient tolerated Insertion well and Catheter Secured with Securing Device to Right Leg. Urine Specimen collected and Sent for Analysis.

## 2022-02-25 NOTE — ED Notes (Signed)
RN provided AVS using Teachback Method. Patient verbalizes understanding of Discharge Instructions. Opportunity for Questioning and Answers were provided by RN. Patient Discharged from ED In wheelchair to Home with Family.

## 2022-02-25 NOTE — ED Notes (Signed)
Patient educated regarding Use and Care of Foley Catheter. Patient verbalizes Understanding of Care.

## 2022-02-25 NOTE — ED Provider Notes (Signed)
[facility] EMERGENCY DEPT Provider Note   CSN: [id number] Arrival date & time: 02/25/22  1112     History  Chief Complaint  Patient presents with   Urinary Retention    [name] is a 36 y.o. female.  36 year old female presents with trouble urinating x2 days.  Patient recent surgery on her vulva and anal regions.  No fever.  Questionable drainage according to her.  Notes suprapubic pressure.  Has been urinating small amounts.  Called her doctor and was told to come here       Home Medications Prior to Admission medications   Medication Sig Start Date End Date Taking? Authorizing Provider  Continuous Blood Gluc Transmit (DEXCOM G6 TRANSMITTER) MISC Inject 1 each into the skin every 14 (fourteen) days. 12/31/21   Jeanie Sewer, NP  cyanocobalamin (,VITAMIN B-12,) 1000 MCG/ML injection Inject into the muscle. 07/16/21   [provider]  Doxepin HCl 3 MG TABS Take 1 tablet (3 mg total) by mouth at bedtime as needed. Try 1 pill for 2-3 nights, if not working, can increase to 2 pills qhs, max dose 2 pills 01/07/22   Jeanie Sewer, NP  esomeprazole (NEXIUM) 40 MG capsule TAKE 1 CAPSULE (40 MG TOTAL) BY MOUTH 2 (TWO) TIMES DAILY BEFORE A MEAL. 02/21/22   Sharyn Creamer, MD  gabapentin (NEURONTIN) 300 MG capsule Take 1-2 capsules (300-600 mg total) by mouth at bedtime. 09/10/21   Jeanie Sewer, NP  HYDROcodone-acetaminophen (NORCO) 10-325 MG tablet Take 1 tablet by mouth every 6 (six) hours as needed for severe pain.    [provider]  insulin aspart (NOVOLOG FLEXPEN) 100 UNIT/ML FlexPen Inject 15-20 Units into the skin 3 (three) times daily with meals. plus sliding scale 08/10/21 02/22/22  Jeanie Sewer, NP  insulin detemir (LEVEMIR FLEXTOUCH) 100 UNIT/ML FlexPen 50 UNITS AT BEDTIME, 40 UNITS EACH AM *D/C LEVEMIR VIALS 01/01/22   Jeanie Sewer, NP  Insulin Pen Needle (B-D UF III MINI PEN NEEDLES) 31G X 5 MM MISC To use with  Insulin pens, as directed 11/20/21   Jeanie Sewer, NP  Insulin Syringes, Disposable, U-100 1 ML MISC Inject 1 each into the skin in the morning and at bedtime. 08/10/21   Jeanie Sewer, NP  lisinopril (ZESTRIL) 2.5 MG tablet Take 1 tablet (2.5 mg total) by mouth daily. For kidney protection from diabetes. 11/28/21   Hudnell, Colletta [place], NP  ondansetron (ZOFRAN) 4 MG tablet TAKE 1 TABLET BY MOUTH EVERY 8 HOURS AS NEEDED FOR NAUSEA AND VOMITING 12/28/21   Jeanie Sewer, NP  senna (SENOKOT) 8.6 MG TABS tablet Take 1 tablet (8.6 mg total) by mouth at bedtime as needed for mild constipation. 02/22/22   Lafonda Mosses, MD  traMADol (ULTRAM) 50 MG tablet Take 1 tablet (50 mg total) by mouth every 6 (six) hours as needed. 02/22/22   Lafonda Mosses, MD      Allergies    Pramipexole    Review of Systems   Review of Systems  All other systems reviewed and are negative.   Physical Exam Updated Vital Signs BP 102/72   Pulse 96   Temp 98.7 F (37.1 C) (Oral)   Resp 20   Ht 1.676 m ('5\' 6"'$ )   Wt 78 kg   LMP 02/20/2022 (Exact Date)   SpO2 99%   BMI 27.76 kg/m  Physical Exam Vitals and nursing note reviewed. Exam conducted with a chaperone present.  Constitutional:      General: She is not  in acute distress.    Appearance: Normal appearance. She is well-developed. She is not toxic-appearing.  HENT:     Head: Normocephalic and atraumatic.  Eyes:     General: Lids are normal.     Conjunctiva/sclera: Conjunctivae normal.     Pupils: Pupils are equal, round, and reactive to light.  Neck:     Thyroid: No thyroid mass.     Trachea: No tracheal deviation.  Cardiovascular:     Rate and Rhythm: Normal rate and regular rhythm.     Heart sounds: Normal heart sounds. No murmur heard.    No gallop.  Pulmonary:     Effort: Pulmonary effort is normal. No respiratory distress.     Breath sounds: Normal breath sounds. No stridor. No decreased breath sounds, wheezing, rhonchi or rales.   Abdominal:     General: There is no distension.     Palpations: Abdomen is soft.     Tenderness: There is no abdominal tenderness. There is no rebound.  Genitourinary:    Comments: Incisions intact.  No active drainage or bleeding noted. Musculoskeletal:        General: No tenderness. Normal range of motion.     Cervical back: Normal range of motion and neck supple.  Skin:    General: Skin is warm and dry.     Findings: No abrasion or rash.  Neurological:     Mental Status: She is alert and oriented to person, place, and time. Mental status is at baseline.     GCS: GCS eye subscore is 4. GCS verbal subscore is 5. GCS motor subscore is 6.     Cranial Nerves: No cranial nerve deficit.     Sensory: No sensory deficit.     Motor: Motor function is intact.  Psychiatric:        Attention and Perception: Attention normal.        Speech: Speech normal.        Behavior: Behavior normal.     ED Results / Procedures / Treatments   Labs (all labs ordered are listed, but only abnormal results are displayed) Labs Reviewed  BASIC METABOLIC PANEL - Abnormal; Notable for the following components:      Result Value   Chloride 96 (*)    Glucose, Bld 226 (*)    All other components within normal limits  CBC - Abnormal; Notable for the following components:   Hemoglobin 11.9 (*)    HCT 35.7 (*)    All other components within normal limits  URINE CULTURE  URINALYSIS, ROUTINE W REFLEX MICROSCOPIC  PREGNANCY, URINE    EKG None  Radiology No results found.  Procedures Procedures    Medications Ordered in ED Medications  oxyCODONE-acetaminophen (PERCOCET/ROXICET) 5-325 MG per tablet 2 tablet (has no administration in time range)    ED Course/ Medical Decision Making/ A&P                           Medical Decision Making Amount and/or Complexity of Data Reviewed Labs: ordered.  Risk Prescription drug management.   Patient had Foley catheter placed by nursing and 720 cc of  urine was drained.  Patient feels much better this time.  Patient's wound was examined by me and has no signs of infection.  Her laboratory studies are reassuring here.  No leukocytosis.  Plan will be for her to go home with a leg bag and follow-up with her surgeon.  We will  also add oxycodone to her pain plan for her postoperative care.  Was given 1 Percocet here with good response.  Patient will stop taking her Ultram        Final Clinical Impression(s) / ED Diagnoses Final diagnoses:  None    Rx / DC Orders ED Discharge Orders     None         Lacretia Leigh, MD 02/25/22 1450

## 2022-02-25 NOTE — ED Triage Notes (Signed)
Pt reports she had some potential cancerous skin tags removed from her vulva and her anus on Friday. Pt reports she has not been able to urinate since Saturday. She also reports a strong odor coming from the surgical sites.

## 2022-02-25 NOTE — ED Notes (Signed)
Patient states she is unable to transfer to the reclining chair at this time.  Asked to wait and have bladder scan after she is in a room.  Notified Triage Nurse Rutherford Limerick, RN

## 2022-02-26 ENCOUNTER — Telehealth: Payer: Self-pay

## 2022-02-26 LAB — URINE CULTURE: Culture: >=100000 — AB

## 2022-02-26 NOTE — Telephone Encounter (Signed)
Pt is scheduled for catheter removal on 03/01/22 with Joylene John NP.

## 2022-02-26 NOTE — Telephone Encounter (Signed)
Received call from patient this morning. Patient reports she received a follow up phone call from the surgery center yesterday checking in with her after her surgery. She told them she had been unable to urinate for 3 days and was advised to go to the emergency room. A foley catheter was inserted in the ER, patient is inquiring on the follow up and when this can be removed. Advised patient that Dr. Berline Lopes is in the operating room today but the message will be given to her.

## 2022-02-27 ENCOUNTER — Telehealth: Payer: Self-pay | Admitting: *Deleted

## 2022-02-27 NOTE — Telephone Encounter (Signed)
Post ED Visit - Positive Culture Follow-up  Culture report reviewed by antimicrobial stewardship pharmacist: [facility] Team '[]'$  Elenor Quinones, Pharm.D. '[]'$  Heide Guile, Pharm.D., BCPS AQ-ID '[]'$  Parks Neptune, Pharm.D., BCPS '[]'$  Alycia Rossetti, Pharm.D., BCPS '[]'$  Leary, [place].D., BCPS, AAHIVP '[]'$  Legrand Como, Pharm.D., BCPS, AAHIVP '[x]'$  Salome Arnt, PharmD, BCPS '[]'$  Johnnette Gourd, PharmD, BCPS '[]'$  Hughes Better, PharmD, BCPS '[]'$  Leeroy Cha, PharmD '[]'$  Laqueta Linden, PharmD, BCPS '[]'$  Albertina Parr, PharmD  [place] Team '[]'$  Leodis Sias, PharmD '[]'$  Lindell Spar, PharmD '[]'$  Royetta Asal, PharmD '[]'$  Graylin Shiver, Rph '[]'$  Rema Fendt) Glennon Mac, PharmD '[]'$  Arlyn Dunning, PharmD '[]'$  Netta Cedars, PharmD '[]'$  Dia Sitter, PharmD '[]'$  Leone Haven, PharmD '[]'$  [name], PharmD '[]'$  Theodis Shove, PharmD '[]'$  Peggyann Juba, PharmD '[]'$  Reuel Boom, PharmD   Positive urine culture No treatment recommended and no further patient follow-up is required at this time. Margarita Mail, PA-C  Harlon Flor Weston 02/27/2022, 9:11 AM

## 2022-02-28 ENCOUNTER — Telehealth: Payer: Self-pay

## 2022-02-28 ENCOUNTER — Inpatient Hospital Stay: Payer: BC Managed Care – PPO

## 2022-02-28 ENCOUNTER — Other Ambulatory Visit: Payer: Self-pay | Admitting: Family

## 2022-02-28 ENCOUNTER — Inpatient Hospital Stay: Payer: BC Managed Care – PPO | Attending: Gynecologic Oncology | Admitting: Gynecologic Oncology

## 2022-02-28 ENCOUNTER — Other Ambulatory Visit: Payer: Self-pay

## 2022-02-28 ENCOUNTER — Ambulatory Visit: Payer: BC Managed Care – PPO | Admitting: Family

## 2022-02-28 VITALS — BP 99/71 | HR 117 | Temp 97.9°F | Resp 18 | Ht 66.0 in | Wt 170.4 lb

## 2022-02-28 DIAGNOSIS — N901 Moderate vulvar dysplasia: Secondary | ICD-10-CM | POA: Insufficient documentation

## 2022-02-28 DIAGNOSIS — R339 Retention of urine, unspecified: Secondary | ICD-10-CM | POA: Insufficient documentation

## 2022-02-28 DIAGNOSIS — K2100 Gastro-esophageal reflux disease with esophagitis, without bleeding: Secondary | ICD-10-CM

## 2022-02-28 DIAGNOSIS — G8918 Other acute postprocedural pain: Secondary | ICD-10-CM

## 2022-02-28 DIAGNOSIS — E1065 Type 1 diabetes mellitus with hyperglycemia: Secondary | ICD-10-CM

## 2022-02-28 DIAGNOSIS — Z978 Presence of other specified devices: Secondary | ICD-10-CM

## 2022-02-28 DIAGNOSIS — R8290 Unspecified abnormal findings in urine: Secondary | ICD-10-CM

## 2022-02-28 DIAGNOSIS — K6282 Dysplasia of anus: Secondary | ICD-10-CM | POA: Insufficient documentation

## 2022-02-28 LAB — URINALYSIS, COMPLETE (UACMP) WITH MICROSCOPIC
Bilirubin Urine: NEGATIVE
Glucose, UA: NEGATIVE mg/dL
Hgb urine dipstick: NEGATIVE
Ketones, ur: 5 mg/dL — AB
Nitrite: POSITIVE — AB
Protein, ur: NEGATIVE mg/dL
Specific Gravity, Urine: 1.017 (ref 1.005–1.030)
pH: 5 (ref 5.0–8.0)

## 2022-02-28 MED ORDER — AMOXICILLIN 500 MG PO CAPS: 500.0000 mg | ORAL_CAPSULE | Freq: Three times a day (TID) | ORAL | 0 refills | Status: DC

## 2022-02-28 MED ORDER — ACETAMINOPHEN-CODEINE 300-30 MG PO TABS: 1.0000 | ORAL_TABLET | ORAL | 0 refills | Status: DC | PRN

## 2022-02-28 MED ORDER — LIDOCAINE 5 % EX OINT: 1.0000 | TOPICAL_OINTMENT | CUTANEOUS | 1 refills | Status: DC | PRN

## 2022-02-28 MED ORDER — NITROFURANTOIN MONOHYD MACRO 100 MG PO CAPS: 100.0000 mg | ORAL_CAPSULE | Freq: Two times a day (BID) | ORAL | 0 refills | Status: DC

## 2022-02-28 NOTE — Patient Instructions (Addendum)
No evidence of infection on today's exam with your healing vulvar and anal areas.   Given the foul smelling urine you were having, we can test a urine sample today with the knowledge if may be contaminated since you are unable to wipe the lasered area with provided clean catch wipes. We will call you with the results.  Continue use of peri bottle throughout the day. After toileting, you can slightly separate the vulva and cleanse the area with the peri bottle.  We will prescribe tylenol #3 for pain if needed along with topical lidocaine cream. Continue drinking your senna tea, especially while taking the pain medication to prevent constipation.   You can also do a sitz bath 2-3 times a day to help cleanse the area and assist with healing.   Continue to monitor urination and let the office know if you have any difficulty emptying your bladder or if you do not feel you are emptying completely.

## 2022-02-28 NOTE — Telephone Encounter (Signed)
Spoke with patient per NP, urine sample that was obtained in the ER came back a day later showing significant amount of lactobacillus. This can normally be found in the urine and genital tract. She has a larger amount of bacteria in her urine and given vitals, foul smelling urine, and recent foley use, Dr. Berline Lopes would like to start treatment on this. We have sent in macrobid for this. Patient reports she does not know of any adverse reactions to any antibiotics and believes she has taken macrobid in the past. Advised patient that she needs to start prescription as soon as possible and drink plenty of fluids. RN will check in with patient tomorrow morning. Patient has after hours number and advised to call with any needs.

## 2022-02-28 NOTE — Progress Notes (Addendum)
Gynecologic Oncology Symptom Management  [name] is a 36 year old female initially seen in consultation on 12/17/21 for high-grade vulvar and peri-anal dysplasia. She is s/p CO2 laser of the vulva and excision of anal lesions with Dr. Jeral Pinch and Dr. Leighton Ruff on 01/19/13. She reports voiding well the first day after surgery but then she developed difficulty. She was seen in the ER on 02/25/22 for urinary retention and had a foley catheter placed at that visit with 720 cc of urine retained in the bladder. Urine culture was obtained and returned on 7/11 with >=100,000 of lactobacillus species.  Her foley catheter was accidentally pulled out last pm. She has been voiding adequate amounts since that time with no hematuria. Prior to foley removal, the catheter was draining adequate amounts. She reports her urine as having a foul odor. She feels she is emptying her bladder completely. No fever or chills reported. Bowels functioning and using senna tea. She feels overall well except for the vulvar pain. Using peri bottle and sitz bath at home. She is requesting a prescription for a different pain medication. She denies chest pain, dyspnea, heart racing and feels her increased pulse is related to being in pain.   Exam: Alert, oriented, in no acute distress, appears uncomfortable related to sitting with vulvar discomfort/soreness Lungs clear. Heart rate at 110 bpm, regular rhythm. No lower extrem edema appreciated The urethra is without abrasions or erythema. The lasered vulvar tissue is healing with no surrounding erythema. Minimal amount of drainage present. Exam limited due to patient discomfort.  Assessment/Plan: [name] is a 36 year old female s/p CO2 laser of the vulva and excision of anal lesions with Dr. Jeral Pinch and Dr. Leighton Ruff on 04/25/01. She presents to the office today for follow. The appt was originally made for foley removal with a voiding trial but since the  foley was pulled out last pm, she was brought to the office to assess the urethra for any signs of damage and to assess the surgical areas on the vulva. We will plan to send a urine sample today and she will be contacted with the results. Tylenol #3 prescribed and patient advised to continue with senna tea. Topical lidocaine also prescribed to assist with pain relief. She is advised to continue with sitz baths and peri care. Reportable signs and symptoms reviewed.

## 2022-02-28 NOTE — Telephone Encounter (Signed)
Received call from patient this afternoon. Patient reports her catheter came out last night while she was sleeping. She denies any bleeding or blood in her urine. She denies pain. She reports she is urinating and is able to empty her bladder. She endorses some burning with urination and reports a fishy smell. "I think I might have a UTI or BV can you call something in for me?" Joylene John, NP notified. Appointment scheduled for today 02/28/22 at 2:00 pm with Joylene John, NP. Patient verbalized understanding and is in agreement of appointment date and time.

## 2022-03-01 ENCOUNTER — Other Ambulatory Visit: Payer: Self-pay | Admitting: Internal Medicine

## 2022-03-01 ENCOUNTER — Telehealth: Payer: Self-pay | Admitting: Gynecologic Oncology

## 2022-03-01 ENCOUNTER — Inpatient Hospital Stay: Payer: BC Managed Care – PPO | Admitting: Gynecologic Oncology

## 2022-03-01 ENCOUNTER — Telehealth: Payer: Self-pay

## 2022-03-01 DIAGNOSIS — K259 Gastric ulcer, unspecified as acute or chronic, without hemorrhage or perforation: Secondary | ICD-10-CM

## 2022-03-01 DIAGNOSIS — D649 Anemia, unspecified: Secondary | ICD-10-CM

## 2022-03-01 NOTE — Telephone Encounter (Signed)
Attempted to contact patient to check in with her and follow up on her symptoms. Unable to reach patient, left message requesting return call.

## 2022-03-01 NOTE — Telephone Encounter (Signed)
Called to check in with patient. Left message with information about preliminary urine culture results. Advised to continue taking the macrobid and we would call her with the results of the susceptibilities when back. Advised to call the after hours service or go to the ER for worsening pain, fever, chills, etc.

## 2022-03-02 LAB — URINE CULTURE: Culture: >=100000 — AB

## 2022-03-04 ENCOUNTER — Other Ambulatory Visit: Payer: Self-pay | Admitting: *Deleted

## 2022-03-04 DIAGNOSIS — N901 Moderate vulvar dysplasia: Secondary | ICD-10-CM

## 2022-03-04 NOTE — Telephone Encounter (Signed)
Spoke with pt to clarify that the Tylenol #3 or lidocaine topical is not helping with her pain. She states she has been using ice regularly as well and it's not helping. Joylene John, NP informed.

## 2022-03-04 NOTE — Telephone Encounter (Signed)
Spoke with pt this morning who stated that she is still experiencing sharp, dull, and burning pain on the vulva. It's a 7/10 and the Tylenol #3 does not help. She stated she is able to urinate regularly now with no other symptoms. She denies fever, chills, nausea, or vomiting. She stated her HR rate is 90 BPM. Set up a lab appt for pt for tomorrow 03/05/22 at 2pm for a CBC  check. Joylene John, NP notified.

## 2022-03-04 NOTE — Telephone Encounter (Signed)
Attempted to speak with pt this morning to check up on her symptoms. Unable to reach pt. LVM for return call.

## 2022-03-05 ENCOUNTER — Other Ambulatory Visit: Payer: Self-pay | Admitting: Gynecologic Oncology

## 2022-03-05 ENCOUNTER — Telehealth: Payer: Self-pay | Admitting: *Deleted

## 2022-03-05 ENCOUNTER — Inpatient Hospital Stay: Payer: BC Managed Care – PPO

## 2022-03-05 DIAGNOSIS — G8918 Other acute postprocedural pain: Secondary | ICD-10-CM

## 2022-03-05 MED ORDER — IBUPROFEN 800 MG PO TABS: 800.0000 mg | ORAL_TABLET | Freq: Three times a day (TID) | ORAL | 0 refills | Status: DC | PRN

## 2022-03-05 NOTE — Progress Notes (Signed)
See RN note.

## 2022-03-05 NOTE — Telephone Encounter (Signed)
Attempted to contact patient to follow up on her pain. Unable to reach patient left message requesting return call.

## 2022-03-05 NOTE — Telephone Encounter (Signed)
Patient called and stated "I need to cancel my appt for today. I have no transportation. He will call me today after he gets off at 5 pm." Patient to call the office with a time for tomorrow later today or tomorrow morning

## 2022-03-05 NOTE — Telephone Encounter (Signed)
Spoke with patient this morning. She reports her pain is an 8/10. RN inquired if she is taking ibuprofen or tylenol. Patient reports she has not been taking either. Reviewed with patient she can alternate taking '800mg'$  of ibuprofen and tylenol being careful to not exceed 4,000 mg of tylenol in a 24 hour period. Patient reports she does not have any ibuprofen and would prefer to have a prescription sent in for this. Joylene John, NP notified. Sh endorses that topical lidocaine does provide some relief. Patient reports her urinary symptoms have resolved, she has been taking her macrobid and will finish it today.  Patient aware of lab appointment today and that she is to have her vitals checked after her lab appointment. Instructed to call with any needs.

## 2022-03-06 ENCOUNTER — Telehealth: Payer: Self-pay

## 2022-03-06 NOTE — Telephone Encounter (Signed)
Attempted to contact patient to reschedule lab appointment and have her vitals checked. Unable to reach patient, left message requesting return call.

## 2022-03-09 ENCOUNTER — Encounter (HOSPITAL_BASED_OUTPATIENT_CLINIC_OR_DEPARTMENT_OTHER): Payer: Self-pay | Admitting: Emergency Medicine

## 2022-03-09 ENCOUNTER — Inpatient Hospital Stay (HOSPITAL_COMMUNITY): Payer: BC Managed Care – PPO

## 2022-03-09 ENCOUNTER — Inpatient Hospital Stay (HOSPITAL_BASED_OUTPATIENT_CLINIC_OR_DEPARTMENT_OTHER)
Admission: EM | Admit: 2022-03-09 | Discharge: 2022-03-10 | DRG: 683 | Disposition: A | Discharge status: Home / Self Care | Payer: BC Managed Care – PPO | Attending: Internal Medicine | Admitting: Internal Medicine

## 2022-03-09 ENCOUNTER — Other Ambulatory Visit: Payer: Self-pay

## 2022-03-09 DIAGNOSIS — Z888 Allergy status to other drugs, medicaments and biological substances status: Secondary | ICD-10-CM

## 2022-03-09 DIAGNOSIS — E1042 Type 1 diabetes mellitus with diabetic polyneuropathy: Secondary | ICD-10-CM

## 2022-03-09 DIAGNOSIS — N179 Acute kidney failure, unspecified: Principal | ICD-10-CM

## 2022-03-09 DIAGNOSIS — R112 Nausea with vomiting, unspecified: Secondary | ICD-10-CM | POA: Diagnosis present

## 2022-03-09 DIAGNOSIS — G894 Chronic pain syndrome: Secondary | ICD-10-CM

## 2022-03-09 DIAGNOSIS — E860 Dehydration: Secondary | ICD-10-CM

## 2022-03-09 DIAGNOSIS — K259 Gastric ulcer, unspecified as acute or chronic, without hemorrhage or perforation: Secondary | ICD-10-CM

## 2022-03-09 DIAGNOSIS — Z79899 Other long term (current) drug therapy: Secondary | ICD-10-CM | POA: Diagnosis not present

## 2022-03-09 DIAGNOSIS — Z794 Long term (current) use of insulin: Secondary | ICD-10-CM

## 2022-03-09 DIAGNOSIS — D649 Anemia, unspecified: Secondary | ICD-10-CM | POA: Diagnosis not present

## 2022-03-09 DIAGNOSIS — K2100 Gastro-esophageal reflux disease with esophagitis, without bleeding: Secondary | ICD-10-CM | POA: Diagnosis present

## 2022-03-09 DIAGNOSIS — Z833 Family history of diabetes mellitus: Secondary | ICD-10-CM | POA: Diagnosis not present

## 2022-03-09 DIAGNOSIS — N390 Urinary tract infection, site not specified: Secondary | ICD-10-CM

## 2022-03-09 DIAGNOSIS — E1065 Type 1 diabetes mellitus with hyperglycemia: Secondary | ICD-10-CM | POA: Diagnosis present

## 2022-03-09 DIAGNOSIS — G629 Polyneuropathy, unspecified: Secondary | ICD-10-CM

## 2022-03-09 DIAGNOSIS — E1069 Type 1 diabetes mellitus with other specified complication: Secondary | ICD-10-CM

## 2022-03-09 LAB — COMPREHENSIVE METABOLIC PANEL
ALT: 12 U/L (ref 0–44)
AST: 9 U/L — ABNORMAL LOW (ref 15–41)
Albumin: 4.5 g/dL (ref 3.5–5.0)
Alkaline Phosphatase: 68 U/L (ref 38–126)
Anion gap: 17 — ABNORMAL HIGH (ref 5–15)
BUN: 36 mg/dL — ABNORMAL HIGH (ref 6–20)
CO2: 20 mmol/L — ABNORMAL LOW (ref 22–32)
Calcium: 10.2 mg/dL (ref 8.9–10.3)
Chloride: 95 mmol/L — ABNORMAL LOW (ref 98–111)
Creatinine, Ser: 2.49 mg/dL — ABNORMAL HIGH (ref 0.44–1.00)
GFR, Estimated: 25 mL/min — ABNORMAL LOW (ref >60)
Glucose, Bld: 131 mg/dL — ABNORMAL HIGH (ref 70–99)
Potassium: 4.7 mmol/L (ref 3.5–5.1)
Sodium: 132 mmol/L — ABNORMAL LOW (ref 135–145)
Total Bilirubin: 0.4 mg/dL (ref 0.3–1.2)
Total Protein: 7.6 g/dL (ref 6.5–8.1)

## 2022-03-09 LAB — CBC WITH DIFFERENTIAL/PLATELET
Abs Immature Granulocytes: 0.01 10*3/uL (ref 0.00–0.07)
Basophils Absolute: 0 10*3/uL (ref 0.0–0.1)
Basophils Relative: 1 %
Eosinophils Absolute: 0.1 10*3/uL (ref 0.0–0.5)
Eosinophils Relative: 1 %
HCT: 39.3 % (ref 36.0–46.0)
Hemoglobin: 13.7 g/dL (ref 12.0–15.0)
Immature Granulocytes: 0 %
Lymphocytes Relative: 33 %
Lymphs Abs: 2.5 10*3/uL (ref 0.7–4.0)
MCH: 30 pg (ref 26.0–34.0)
MCHC: 34.9 g/dL (ref 30.0–36.0)
MCV: 86.2 fL (ref 80.0–100.0)
Monocytes Absolute: 0.4 10*3/uL (ref 0.1–1.0)
Monocytes Relative: 6 %
Neutro Abs: 4.5 10*3/uL (ref 1.7–7.7)
Neutrophils Relative %: 59 %
Platelets: 400 10*3/uL (ref 150–400)
RBC: 4.56 MIL/uL (ref 3.87–5.11)
RDW: 13.2 % (ref 11.5–15.5)
WBC: 7.6 10*3/uL (ref 4.0–10.5)
nRBC: 0 % (ref 0.0–0.2)

## 2022-03-09 LAB — URINALYSIS, ROUTINE W REFLEX MICROSCOPIC
Bilirubin Urine: NEGATIVE
Glucose, UA: NEGATIVE mg/dL
Hgb urine dipstick: NEGATIVE
Ketones, ur: 15 mg/dL — AB
Nitrite: NEGATIVE
Protein, ur: 30 mg/dL — AB
Specific Gravity, Urine: 1.009 (ref 1.005–1.030)
WBC, UA: >50 WBC/hpf — ABNORMAL HIGH (ref 0–5)
pH: 5.5 (ref 5.0–8.0)

## 2022-03-09 LAB — I-STAT VENOUS BLOOD GAS, ED
Acid-base deficit: 3 mmol/L — ABNORMAL HIGH (ref 0.0–2.0)
Bicarbonate: 23.3 mmol/L (ref 20.0–28.0)
Calcium, Ion: 1.21 mmol/L (ref 1.15–1.40)
HCT: 37 % (ref 36.0–46.0)
Hemoglobin: 12.6 g/dL (ref 12.0–15.0)
O2 Saturation: 64 %
Patient temperature: 97.8
Potassium: 6.7 mmol/L (ref 3.5–5.1)
Sodium: 130 mmol/L — ABNORMAL LOW (ref 135–145)
TCO2: 25 mmol/L (ref 22–32)
pCO2, Ven: 43.4 mmHg — ABNORMAL LOW (ref 44–60)
pH, Ven: 7.336 (ref 7.25–7.43)
pO2, Ven: 34 mmHg (ref 32–45)

## 2022-03-09 LAB — RAPID URINE DRUG SCREEN, HOSP PERFORMED
Amphetamines: NOT DETECTED
Barbiturates: NOT DETECTED
Benzodiazepines: NOT DETECTED
Cocaine: NOT DETECTED
Opiates: NOT DETECTED
Tetrahydrocannabinol: NOT DETECTED

## 2022-03-09 LAB — HEMOGLOBIN A1C
Hgb A1c MFr Bld: 7.7 % — ABNORMAL HIGH (ref 4.8–5.6)
Mean Plasma Glucose: 174.29 mg/dL

## 2022-03-09 LAB — GLUCOSE, CAPILLARY: Glucose-Capillary: 128 mg/dL — ABNORMAL HIGH (ref 70–99)

## 2022-03-09 LAB — MAGNESIUM: Magnesium: 2.1 mg/dL (ref 1.7–2.4)

## 2022-03-09 LAB — LACTIC ACID, PLASMA: Lactic Acid, Venous: 1.1 mmol/L (ref 0.5–1.9)

## 2022-03-09 LAB — CBG MONITORING, ED: Glucose-Capillary: 114 mg/dL — ABNORMAL HIGH (ref 70–99)

## 2022-03-09 LAB — PREGNANCY, URINE: Preg Test, Ur: NEGATIVE

## 2022-03-09 LAB — PHOSPHORUS: Phosphorus: 5.7 mg/dL — ABNORMAL HIGH (ref 2.5–4.6)

## 2022-03-09 LAB — BETA-HYDROXYBUTYRIC ACID: Beta-Hydroxybutyric Acid: 3.23 mmol/L — ABNORMAL HIGH (ref 0.05–0.27)

## 2022-03-09 MED ORDER — ACETAMINOPHEN 650 MG RE SUPP: 650.0000 mg | Freq: Four times a day (QID) | RECTAL | Status: DC | PRN

## 2022-03-09 MED ORDER — PANTOPRAZOLE SODIUM 40 MG PO TBEC
40.0000 mg | DELAYED_RELEASE_TABLET | Freq: Two times a day (BID) | ORAL | Status: DC
  Administered 2022-03-10: 40 mg via ORAL
  Filled 2022-03-09: qty 1

## 2022-03-09 MED ORDER — AMITRIPTYLINE HCL 50 MG PO TABS
50.0000 mg | ORAL_TABLET | Freq: Every day | ORAL | Status: DC
  Administered 2022-03-09: 50 mg via ORAL
  Filled 2022-03-09 (×2): qty 1

## 2022-03-09 MED ORDER — HYDROMORPHONE HCL 1 MG/ML IJ SOLN
0.5000 mg | INTRAMUSCULAR | Status: DC | PRN
  Administered 2022-03-09 – 2022-03-10 (×4): 1 mg via INTRAVENOUS
  Filled 2022-03-09 (×4): qty 1

## 2022-03-09 MED ORDER — LACTATED RINGERS IV BOLUS
1000.0000 mL | Freq: Once | INTRAVENOUS | Status: AC
  Administered 2022-03-09: 1000 mL via INTRAVENOUS

## 2022-03-09 MED ORDER — INSULIN ASPART 100 UNIT/ML IJ SOLN
0.0000 [IU] | Freq: Three times a day (TID) | INTRAMUSCULAR | Status: DC
  Administered 2022-03-10: 2 [IU] via SUBCUTANEOUS

## 2022-03-09 MED ORDER — ONDANSETRON HCL 4 MG/2ML IJ SOLN
4.0000 mg | Freq: Four times a day (QID) | INTRAMUSCULAR | Status: DC | PRN
  Administered 2022-03-10: 4 mg via INTRAVENOUS
  Filled 2022-03-09: qty 2

## 2022-03-09 MED ORDER — SODIUM CHLORIDE 0.9 % IV SOLN
1.0000 g | Freq: Once | INTRAVENOUS | Status: AC
  Administered 2022-03-09: 1 g via INTRAVENOUS
  Filled 2022-03-09: qty 10

## 2022-03-09 MED ORDER — SODIUM CHLORIDE 0.9 % IV SOLN: INTRAVENOUS | Status: DC

## 2022-03-09 MED ORDER — HEPARIN SODIUM (PORCINE) 5000 UNIT/ML IJ SOLN
5000.0000 [IU] | Freq: Three times a day (TID) | INTRAMUSCULAR | Status: DC
  Administered 2022-03-09 – 2022-03-10 (×2): 5000 [IU] via SUBCUTANEOUS
  Filled 2022-03-09 (×2): qty 1

## 2022-03-09 MED ORDER — HYDROMORPHONE HCL 1 MG/ML IJ SOLN
1.0000 mg | INTRAMUSCULAR | Status: DC | PRN
  Administered 2022-03-09: 1 mg via INTRAVENOUS
  Filled 2022-03-09 (×2): qty 1

## 2022-03-09 MED ORDER — OXYCODONE HCL 5 MG PO TABS
5.0000 mg | ORAL_TABLET | ORAL | Status: DC | PRN
  Administered 2022-03-09: 10 mg via ORAL
  Filled 2022-03-09 (×2): qty 2

## 2022-03-09 MED ORDER — SODIUM CHLORIDE 0.9 % IV SOLN: INTRAVENOUS | Status: DC | PRN

## 2022-03-09 MED ORDER — GABAPENTIN 400 MG PO CAPS
400.0000 mg | ORAL_CAPSULE | Freq: Three times a day (TID) | ORAL | Status: DC
  Administered 2022-03-09 – 2022-03-10 (×2): 400 mg via ORAL
  Filled 2022-03-09 (×2): qty 1

## 2022-03-09 MED ORDER — ZOLPIDEM TARTRATE 5 MG PO TABS
5.0000 mg | ORAL_TABLET | Freq: Every evening | ORAL | Status: DC | PRN
Start: 2022-03-09 — End: 2022-03-10
  Administered 2022-03-09: 5 mg via ORAL
  Filled 2022-03-09: qty 1

## 2022-03-09 MED ORDER — ACETAMINOPHEN 325 MG PO TABS: 650.0000 mg | ORAL_TABLET | Freq: Four times a day (QID) | ORAL | Status: DC | PRN

## 2022-03-09 MED ORDER — SODIUM CHLORIDE 0.9 % IV SOLN
1.0000 g | INTRAVENOUS | Status: DC
  Administered 2022-03-10: 1 g via INTRAVENOUS
  Filled 2022-03-09: qty 10

## 2022-03-09 MED ORDER — SENNA 8.6 MG PO TABS: 1.0000 | ORAL_TABLET | Freq: Every evening | ORAL | Status: DC | PRN

## 2022-03-09 MED ORDER — LACTATED RINGERS IV SOLN: INTRAVENOUS | Status: DC

## 2022-03-09 MED ORDER — HYDROMORPHONE HCL 1 MG/ML IJ SOLN
1.0000 mg | Freq: Once | INTRAMUSCULAR | Status: AC
  Administered 2022-03-09: 1 mg via INTRAVENOUS
  Filled 2022-03-09: qty 1

## 2022-03-09 MED ORDER — ONDANSETRON HCL 4 MG PO TABS: 4.0000 mg | ORAL_TABLET | Freq: Four times a day (QID) | ORAL | Status: DC | PRN

## 2022-03-09 NOTE — ED Notes (Signed)
RT note: Venous blood obtained by RN for ISTAT blood gas with IV start, resulted with MD made aware of K+ level.

## 2022-03-09 NOTE — Progress Notes (Signed)
Plan of Care Note for accepted transfer   Patient: [name] MRN: [id number]   DOA: 03/09/2022  Facility requesting transfer: [facility] Requesting Provider: EDP Reason for transfer: UTI/AKi/Dehydration Facility course:  Blood pressure 120/76, pulse (!) 111, temperature 98.2 F (36.8 C), temperature source Oral, resp. rate 12, height '5\' 6"'$  (1.676 m), weight 77.1 kg, SpO2 97 %. H/o type 1 diabetes with chronic neuropathic leg pain presents to the ED c/o bilateral foot pain to the point she lost her appetite with  poor oral intake for the last few days, EDP initially was concerned about ischemic pain but able to palpate pedal pulse and reports patient has good capillary refills.  EDP thinks she might benefit from ABI studies, but does not think vascular surgery need to be involved at this time. Labs  showed UTI ,AKI ,dehydration. she received hydration and antibiotics, EDP requests admit to hospitalist service.  Per EDP patient vital signs are stable, does not think she needs telemetry bed, accepted to [facility].   Plan of care: The patient is accepted for admission to [facility]  unit, at [facility]. Or [facility].    Author: Florencia Reasons, MD PhD FACP 03/09/2022  Check [email or web address] for on-call coverage.  Nursing staff, Please call [facility] number on Amion as soon as patient's arrival, so appropriate admitting provider can evaluate the pt.

## 2022-03-09 NOTE — ED Notes (Signed)
Doppler at bedside.

## 2022-03-09 NOTE — H&P (Signed)
History and Physical    [name] LKG:[id number] DOB: 02-15-1986 DOA: 03/09/2022  PCP: Jeanie Sewer, NP  Patient coming from: Home  I have personally briefly reviewed patient's old medical records in [place]  Chief Complaint: Bilateral feet pain  HPI: [name] is a 36 y.o. female with medical history significant of type 1 diabetes, diabetic neuropathy, who complains of pain in her feet that has been ongoing for the last few years.  She reports that in the past few weeks, it has gotten substantially worse.  She says due to pain, she is unable to sleep, eat.  She has had nausea and vomiting for the past several days with minimal p.o. intake.  Reports that vomiting even occurs after water intake.  She has not had any diarrhea.  She denies any abdominal pain.  She has not had any fevers.  She denies any dysuria.  She feels that her neuropathic pain has gotten significantly worse.  This is located in her feet bilaterally up to her ankles.  Described as numbness, tingling, stabbing, pins-and-needles, burning type pain  ED Course: She presented to the emergency room at [facility] where initial labs showed that she had acute kidney injury with a creatinine of 2.4.  CBC unremarkable.  Urinalysis indicated possible infection.  Beta hydroxybutyric acid elevated at 3.2, but blood glucose normal at 131.  Urine toxicology screen was negative.  Review of Systems: As per HPI otherwise 10 point review of systems negative.    Past Medical History:  Diagnosis Date   Chronic constipation 11/08/2021   Diabetic neuropathy ([place])    History of chicken pox    Type 1 diabetes ([place])    Vaginal odor 11/08/2021    Past Surgical History:  Procedure Laterality Date   CESAREAN SECTION     only one C/S per pt.   CO2 LASER APPLICATION N/A 01/22/4402   Procedure: CO2 LASER APPLICATION TO VULVA;  Surgeon: Lafonda Mosses, MD;  Location: [place];   Service: General;  Laterality: N/A;   DILATION AND CURETTAGE OF UTERUS     EXCISION OF SKIN TAG N/A 02/22/2022   Procedure: EXCISION OF ANAL SKIN TAGS;  Surgeon: Leighton Ruff, MD;  Location: [place];  Service: General;  Laterality: N/A;    Social History:  reports that she has never smoked. She has never been exposed to tobacco smoke. She has never used smokeless tobacco. She reports that she does not drink alcohol and does not use drugs.  Allergies  Allergen Reactions   Pramipexole Shortness Of Breath    Family History  Problem Relation Age of Onset   Hypertension Mother    Glaucoma Mother    Diabetes Maternal Grandmother    Neuropathy Neg Hx    Colon cancer Neg Hx    Stomach cancer Neg Hx    Esophageal cancer Neg Hx    Colon polyps Neg Hx    Rectal cancer Neg Hx     Prior to Admission medications   Medication Sig Start Date End Date Taking? Authorizing Provider  acetaminophen-codeine (TYLENOL #3) 300-30 MG tablet Take 1 tablet by mouth every 4 (four) hours as needed for severe pain. For AFTER surgery, do not take and drive, do not take with other pain medications 02/28/22   Cross, Lenna Sciara D, NP  Continuous Blood Gluc Transmit (DEXCOM G6 TRANSMITTER) MISC Inject 1 each into the skin every 14 (fourteen) days. 12/31/21   Jeanie Sewer,  NP  cyanocobalamin (,VITAMIN B-12,) 1000 MCG/ML injection Inject into the muscle. 07/16/21   [provider]  Doxepin HCl 3 MG TABS Take 1 tablet (3 mg total) by mouth at bedtime as needed. Try 1 pill for 2-3 nights, if not working, can increase to 2 pills qhs, max dose 2 pills 01/07/22   Jeanie Sewer, NP  esomeprazole (NEXIUM) 40 MG capsule TAKE 1 CAPSULE (40 MG TOTAL) BY MOUTH 2 (TWO) TIMES DAILY BEFORE A MEAL. 03/01/22   Sharyn Creamer, MD  gabapentin (NEURONTIN) 300 MG capsule Take 1-2 capsules (300-600 mg total) by mouth at bedtime. 09/10/21   Jeanie Sewer, NP  ibuprofen (ADVIL) 800 MG tablet Take 1 tablet  (800 mg total) by mouth every 8 (eight) hours as needed for moderate pain. 03/05/22   Cross, Lenna Sciara D, NP  insulin aspart (NOVOLOG FLEXPEN) 100 UNIT/ML FlexPen Inject 15-20 Units into the skin 3 (three) times daily with meals. plus sliding scale 08/10/21 02/22/22  Jeanie Sewer, NP  insulin detemir (LEVEMIR FLEXTOUCH) 100 UNIT/ML FlexPen 50 UNITS AT BEDTIME, 40 UNITS EACH AM *D/C LEVEMIR VIALS 01/01/22   Jeanie Sewer, NP  Insulin Pen Needle (B-D UF III MINI PEN NEEDLES) 31G X 5 MM MISC To use with Insulin pens, as directed 11/20/21   Jeanie Sewer, NP  Insulin Syringes, Disposable, U-100 1 ML MISC Inject 1 each into the skin in the morning and at bedtime. 08/10/21   Jeanie Sewer, NP  lidocaine (XYLOCAINE) 5 % ointment Apply 1 Application topically as needed (to the vulva). 02/28/22   Cross, Lenna Sciara D, NP  lisinopril (ZESTRIL) 2.5 MG tablet Take 1 tablet (2.5 mg total) by mouth daily. For kidney protection from diabetes. 11/28/21   Jeanie Sewer, NP  nitrofurantoin, macrocrystal-monohydrate, (MACROBID) 100 MG capsule Take 1 capsule (100 mg total) by mouth 2 (two) times daily. 02/28/22   Cross, Lenna Sciara D, NP  ondansetron (ZOFRAN) 4 MG tablet TAKE 1 TABLET BY MOUTH EVERY 8 HOURS AS NEEDED FOR NAUSEA AND VOMITING 12/28/21   Jeanie Sewer, NP  senna (SENOKOT) 8.6 MG TABS tablet Take 1 tablet (8.6 mg total) by mouth at bedtime as needed for mild constipation. 02/22/22   Lafonda Mosses, MD    Physical Exam: Vitals:   03/09/22 1515 03/09/22 1600 03/09/22 1627 03/09/22 1654  BP: (!) 143/93 (!) 127/92  128/87  Pulse: 89 98  (!) 105  Resp: '14 14  17  '$ Temp:   98.2 F (36.8 C) 98.6 F (37 C)  TempSrc:   Oral Oral  SpO2: 100% 99%  100%  Weight:      Height:        Constitutional: NAD, calm, comfortable Eyes: PERRL, lids and conjunctivae normal ENMT: Mucous membranes are moist. Posterior pharynx clear of any exudate or lesions.Normal dentition.  Neck: normal, supple, no  masses, no thyromegaly Respiratory: clear to auscultation bilaterally, no wheezing, no crackles. Normal respiratory effort. No accessory muscle use.  Cardiovascular: Regular rate and rhythm, no murmurs / rubs / gallops. No extremity edema. 2+ pedal pulses. No carotid bruits.  Abdomen: no tenderness, no masses palpated. No hepatosplenomegaly. Bowel sounds positive.  Musculoskeletal: no clubbing / cyanosis. No joint deformity upper and lower extremities. Good ROM, no contractures. Normal muscle tone.  Feet are cool to touch bilaterally, but DP pulses are palpable bilaterally Skin: no rashes, lesions, ulcers. No induration Neurologic: CN 2-12 grossly intact. Sensation intact, DTR normal. Strength 5/5 in all 4.  Psychiatric: Normal judgment and insight. Alert and oriented  x 3. Normal mood.    Labs on Admission: I have personally reviewed following labs and imaging studies  CBC: Recent Labs  Lab 03/09/22 0916 03/09/22 1033  WBC 7.6  --   NEUTROABS 4.5  --   HGB 13.7 12.6  HCT 39.3 37.0  MCV 86.2  --   PLT 400  --    Basic Metabolic Panel: Recent Labs  Lab 03/09/22 1033 03/09/22 1130  NA 130* 132*  K 6.7* 4.7  CL  --  95*  CO2  --  20*  GLUCOSE  --  131*  BUN  --  36*  CREATININE  --  2.49*  CALCIUM  --  10.2  MG  --  2.1  PHOS  --  5.7*   GFR: Estimated Creatinine Clearance: 33.1 mL/min (A) (by C-G formula based on SCr of 2.49 mg/dL (H)). Liver Function Tests: Recent Labs  Lab 03/09/22 1130  AST 9*  ALT 12  ALKPHOS 68  BILITOT 0.4  PROT 7.6  ALBUMIN 4.5   No results for input(s): "LIPASE", "AMYLASE" in the last 168 hours. No results for input(s): "AMMONIA" in the last 168 hours. Coagulation Profile: No results for input(s): "INR", "PROTIME" in the last 168 hours. Cardiac Enzymes: No results for input(s): "CKTOTAL", "CKMB", "CKMBINDEX", "TROPONINI" in the last 168 hours. BNP (last 3 results) No results for input(s): "PROBNP" in the last 8760  hours. HbA1C: Recent Labs    03/09/22 0916  HGBA1C 7.7*   CBG: Recent Labs  Lab 03/09/22 0711  GLUCAP 114*   Lipid Profile: No results for input(s): "CHOL", "HDL", "LDLCALC", "TRIG", "CHOLHDL", "LDLDIRECT" in the last 72 hours. Thyroid Function Tests: No results for input(s): "TSH", "T4TOTAL", "FREET4", "T3FREE", "THYROIDAB" in the last 72 hours. Anemia Panel: No results for input(s): "VITAMINB12", "FOLATE", "FERRITIN", "TIBC", "IRON", "RETICCTPCT" in the last 72 hours. Urine analysis:    Component Value Date/Time   COLORURINE YELLOW 03/09/2022 0916   APPEARANCEUR HAZY (A) 03/09/2022 0916   LABSPEC 1.009 03/09/2022 0916   PHURINE 5.5 03/09/2022 0916   GLUCOSEU NEGATIVE 03/09/2022 0916   HGBUR NEGATIVE 03/09/2022 0916   BILIRUBINUR NEGATIVE 03/09/2022 0916   BILIRUBINUR negative 11/08/2021 1424   KETONESUR 15 (A) 03/09/2022 0916   PROTEINUR 30 (A) 03/09/2022 0916   UROBILINOGEN 0.2 11/08/2021 1424   UROBILINOGEN 0.2 05/03/2021 0837   NITRITE NEGATIVE 03/09/2022 0916   LEUKOCYTESUR MODERATE (A) 03/09/2022 0916    Radiological Exams on Admission: No results found.    Assessment/Plan Principal Problem:   AKI (acute kidney injury) ([place]) Active Problems:   Uncontrolled type 1 diabetes mellitus with hyperglycemia (HCC)   Diabetic polyneuropathy associated with type 1 diabetes mellitus (HCC)   Nausea and vomiting   Gastroesophageal reflux disease with esophagitis without hemorrhage   UTI (urinary tract infection)   Chronic pain syndrome     Acute kidney injury -Related to decreased p.o. intake and recurrent vomiting -She also admits to taking ibuprofen recently for pain -She will be hydrated with IV fluids -Check renal ultrasound  Diabetic neuropathy Chronic pain syndrome -Reports taking hydrocodone at home, but had run out of it recently since she required extra doses due to worsening pain -She is chronically on gabapentin 200 mg 3 times daily.  She does not  feel it is beneficial.  She reports being on Lyrica in the past and did not tolerate Cymbalta. -We will increase her dose of gabapentin for now.  We will also add in amitriptyline. -We will keep opiate medication  in case symptoms are not managed although opiates are not the ideal choice to treat neuropathy -We will check B12 and TSH -If symptoms persist, can consider ABIs  Nausea and vomiting -Etiology is unclear although she does feel that her symptoms are subsiding and wants to try to eat -Continue antiemetics, advance diet  Possible UTI -Started on Rocephin  Type 1 diabetes -Continue on Levemir twice daily, reduced dose until p.o. intake can be confirmed -Sliding scale insulin with NovoLog -A1c is 7.7  GERD -Continue PPI twice daily -Follow-up culture  Vulvar/perianal dysplastic lesions -Recent ablation/excision of these lesions by GYN oncology/general surgery -She already has follow-up appointment scheduled with gynecology.  DVT prophylaxis: heparin  Code Status: full code  Family Communication: discussed with patient  Disposition Plan: discharge home once renal function improved and pain reasonably controlled  Consults called:   Admission status: inpatient, medsurg   Kathie Dike MD [facility]   If 7PM-7AM, please contact night-coverage [email or web address]   03/09/2022, 6:20 PM

## 2022-03-09 NOTE — ED Provider Notes (Signed)
[facility] EMERGENCY DEPT Provider Note   CSN: [id number] Arrival date & time: 03/09/22  9323     History  Chief Complaint  Patient presents with   Foot Pain    [name] is a 36 y.o. female.  HPI Patient reports she had pain in her feet for several years.  She reports she has a diagnosis of neuropathy.  She has been seen by several physicians but has not been able to get any relief of symptoms.  She has tried Neurontin.  She reports over the past month approximately the pain has gotten much worse.  She reports there is a constant tingling but also aching and burning pain.  She reports for 2 weeks now the pain has been so bad that she has essentially lost her appetite and does not feel like eating.  She has not had fevers or chills.  Patient reports she has been extremely nauseated and has not been taking in fluids.  She denies she is having abdominal pain.  She denies low back pain.  She denies burning urgency with urination.  She is able to walk and has not lost function in the legs or the feet.  Reports she is compliant with her medications    Home Medications Prior to Admission medications   Medication Sig Start Date End Date Taking? Authorizing Provider  acetaminophen-codeine (TYLENOL #3) 300-30 MG tablet Take 1 tablet by mouth every 4 (four) hours as needed for severe pain. For AFTER surgery, do not take and drive, do not take with other pain medications 02/28/22   Cross, Lenna Sciara D, NP  Continuous Blood Gluc Transmit (DEXCOM G6 TRANSMITTER) MISC Inject 1 each into the skin every 14 (fourteen) days. 12/31/21   Jeanie Sewer, NP  cyanocobalamin (,VITAMIN B-12,) 1000 MCG/ML injection Inject into the muscle. 07/16/21   [provider]  Doxepin HCl 3 MG TABS Take 1 tablet (3 mg total) by mouth at bedtime as needed. Try 1 pill for 2-3 nights, if not working, can increase to 2 pills qhs, max dose 2 pills 01/07/22   Jeanie Sewer, NP   esomeprazole (NEXIUM) 40 MG capsule TAKE 1 CAPSULE (40 MG TOTAL) BY MOUTH 2 (TWO) TIMES DAILY BEFORE A MEAL. 03/01/22   Sharyn Creamer, MD  gabapentin (NEURONTIN) 300 MG capsule Take 1-2 capsules (300-600 mg total) by mouth at bedtime. 09/10/21   Jeanie Sewer, NP  ibuprofen (ADVIL) 800 MG tablet Take 1 tablet (800 mg total) by mouth every 8 (eight) hours as needed for moderate pain. 03/05/22   Cross, Lenna Sciara D, NP  insulin aspart (NOVOLOG FLEXPEN) 100 UNIT/ML FlexPen Inject 15-20 Units into the skin 3 (three) times daily with meals. plus sliding scale 08/10/21 02/22/22  Jeanie Sewer, NP  insulin detemir (LEVEMIR FLEXTOUCH) 100 UNIT/ML FlexPen 50 UNITS AT BEDTIME, 40 UNITS EACH AM *D/C LEVEMIR VIALS 01/01/22   Jeanie Sewer, NP  Insulin Pen Needle (B-D UF III MINI PEN NEEDLES) 31G X 5 MM MISC To use with Insulin pens, as directed 11/20/21   Jeanie Sewer, NP  Insulin Syringes, Disposable, U-100 1 ML MISC Inject 1 each into the skin in the morning and at bedtime. 08/10/21   Jeanie Sewer, NP  lidocaine (XYLOCAINE) 5 % ointment Apply 1 Application topically as needed (to the vulva). 02/28/22   Cross, Lenna Sciara D, NP  lisinopril (ZESTRIL) 2.5 MG tablet Take 1 tablet (2.5 mg total) by mouth daily. For kidney protection from diabetes. 11/28/21   Jeanie Sewer, NP  nitrofurantoin, macrocrystal-monohydrate, (MACROBID) 100 MG capsule Take 1 capsule (100 mg total) by mouth 2 (two) times daily. 02/28/22   Cross, Lenna Sciara D, NP  ondansetron (ZOFRAN) 4 MG tablet TAKE 1 TABLET BY MOUTH EVERY 8 HOURS AS NEEDED FOR NAUSEA AND VOMITING 12/28/21   Jeanie Sewer, NP  senna (SENOKOT) 8.6 MG TABS tablet Take 1 tablet (8.6 mg total) by mouth at bedtime as needed for mild constipation. 02/22/22   Lafonda Mosses, MD      Allergies    Pramipexole    Review of Systems   Review of Systems 10 systems reviewed negative except as per HPI Physical Exam Updated Vital Signs BP (!) 143/93   Pulse 89    Temp 98.2 F (36.8 C) (Oral)   Resp 14   Ht '5\' 6"'$  (1.676 m)   Wt 77.1 kg   SpO2 100%   BMI 27.44 kg/m  Physical Exam Constitutional:      Comments: Patient is alert.  Nontoxic.  She does appear mildly ill and fatigued.  Slightly pale in appearance  HENT:     Head: Normocephalic and atraumatic.     Mouth/Throat:     Mouth: Mucous membranes are dry.     Pharynx: Oropharynx is clear.  Eyes:     Extraocular Movements: Extraocular movements intact.  Cardiovascular:     Comments: Mild tachycardia no gross rub murmur gallop Pulmonary:     Effort: Pulmonary effort is normal.     Breath sounds: Normal breath sounds.  Abdominal:     General: There is no distension.     Palpations: Abdomen is soft.     Tenderness: There is no abdominal tenderness. There is no guarding.  Musculoskeletal:     Cervical back: Neck supple.     Comments: Both feet are symmetric.  Skin condition of the legs to the lower legs and feet is good.  There are no ulcers or rashes.  All skin surfaces are intact.  Patient's feet bilaterally do feel cool to the touch.  I am able to palpate the posterior tibial pulse on the left foot.  I could not palpate the dorsalis pedis pulses bilaterally until using a Doppler to identify them.  Once pulses were identified, they are very faintly palpable.  Skin:    General: Skin is warm and dry.  Neurological:     General: No focal deficit present.     Mental Status: She is oriented to person, place, and time.     Motor: No weakness.     Coordination: Coordination normal.  Psychiatric:        Mood and Affect: Mood normal.     ED Results / Procedures / Treatments   Labs (all labs ordered are listed, but only abnormal results are displayed) Labs Reviewed  URINALYSIS, ROUTINE W REFLEX MICROSCOPIC - Abnormal; Notable for the following components:      Result Value   APPearance HAZY (*)    Ketones, ur 15 (*)    Protein, ur 30 (*)    Leukocytes,Ua MODERATE (*)    WBC, UA >50  (*)    Bacteria, UA MANY (*)    All other components within normal limits  COMPREHENSIVE METABOLIC PANEL - Abnormal; Notable for the following components:   Sodium 132 (*)    Chloride 95 (*)    CO2 20 (*)    Glucose, Bld 131 (*)    BUN 36 (*)    Creatinine, Ser 2.49 (*)    AST  9 (*)    GFR, Estimated 25 (*)    Anion gap 17 (*)    All other components within normal limits  PHOSPHORUS - Abnormal; Notable for the following components:   Phosphorus 5.7 (*)    All other components within normal limits  CBG MONITORING, ED - Abnormal; Notable for the following components:   Glucose-Capillary 114 (*)    All other components within normal limits  I-STAT VENOUS BLOOD GAS, ED - Abnormal; Notable for the following components:   pCO2, Ven 43.4 (*)    Acid-base deficit 3.0 (*)    Sodium 130 (*)    Potassium 6.7 (*)    All other components within normal limits  URINE CULTURE  CBC WITH DIFFERENTIAL/PLATELET  PREGNANCY, URINE  LACTIC ACID, PLASMA  MAGNESIUM  HEMOGLOBIN A1C  BETA-HYDROXYBUTYRIC ACID  RAPID URINE DRUG SCREEN, HOSP PERFORMED    EKG None  Radiology No results found.  Procedures Procedures    Medications Ordered in ED Medications  0.9 %  sodium chloride infusion (0 mLs Intravenous Stopped 03/09/22 1234)  lactated ringers infusion ( Intravenous New Bag/Given 03/09/22 1353)  HYDROmorphone (DILAUDID) injection 1 mg (has no administration in time range)  lactated ringers bolus 1,000 mL (0 mLs Intravenous Stopped 03/09/22 1300)  HYDROmorphone (DILAUDID) injection 1 mg (1 mg Intravenous Given 03/09/22 0939)  cefTRIAXone (ROCEPHIN) 1 g in sodium chloride 0.9 % 100 mL IVPB (0 g Intravenous Stopped 03/09/22 1305)  lactated ringers bolus 1,000 mL (1,000 mLs Intravenous New Bag/Given 03/09/22 1354)    ED Course/ Medical Decision Making/ A&P                           Medical Decision Making Amount and/or Complexity of Data Reviewed Labs: ordered.  Risk Prescription drug  management. Decision regarding hospitalization.  Patient is a type I diabetic.  She is a longstanding diabetic.  Patient reports known history of peripheral neuropathy in the feet but has pain that has been persistent and worsening in both feet.  This seems to have escalated a fair amount within the past weeks to month.  She does not have any wounds.  She does have diminished pedal pulses but they are present by Doppler and faintly palpable.  Patient's other complaint is feeling generally extremely ill and weak.  Being diabetic and tachycardic, I we will proceed with lab work for possible DKA, dehydration, infection.  Labs returned with significant elevation in patient's BUN and creatinine relative to baseline.  Baseline she has normal renal function.  Day creatinine is up to 2.49 from baseline of about 0.7 and GFR at 25 and anion gap of 17.  Appears secondary to dehydration.  Patient's venous gas does not show acidosis and blood glucose level is at 131.  Other concurrent finding is significant positive urinalysis with leuk esterase and urine 50 white cells with good specimen.  Currently with risk factors of type 1 diabetes, AKI and UTI will aggressively rehydrate and treat for pain control.  Patient's had 2 L of lactated Ringer's and does feel improved.  Rocephin IV given for UTI.  She has been treated for pain with Dilaudid.  Zofran treated with nausea.  At this time plan for admission for ongoing treatment.  Consult: Reviewed with Dr. Erlinda Hong [facility]. Accepts for admission.         Final Clinical Impression(s) / ED Diagnoses Final diagnoses:  Dehydration  AKI (acute kidney injury) ([place])  Type 1 diabetes mellitus  with other specified complication ([place])  Neuropathy    Rx / [place] Orders ED Discharge Orders     None         Charlesetta Shanks, MD 03/09/22 1540

## 2022-03-09 NOTE — ED Triage Notes (Signed)
  Patient comes in with bilateral foot/ankle pain that she states is from diabetic neuropathy.  Patient states it has been going on for two weeks and she has been unable to eat or drink anything substantial during that time period.  Patient also endorses emesis for the past several days.  Pain 10/10, throbbing/stabbing.

## 2022-03-10 DIAGNOSIS — E860 Dehydration: Secondary | ICD-10-CM | POA: Diagnosis not present

## 2022-03-10 DIAGNOSIS — D649 Anemia, unspecified: Secondary | ICD-10-CM | POA: Diagnosis not present

## 2022-03-10 DIAGNOSIS — N179 Acute kidney failure, unspecified: Secondary | ICD-10-CM | POA: Diagnosis not present

## 2022-03-10 DIAGNOSIS — G894 Chronic pain syndrome: Secondary | ICD-10-CM | POA: Diagnosis not present

## 2022-03-10 LAB — COMPREHENSIVE METABOLIC PANEL
ALT: 14 U/L (ref 0–44)
AST: 11 U/L — ABNORMAL LOW (ref 15–41)
Albumin: 3.3 g/dL — ABNORMAL LOW (ref 3.5–5.0)
Alkaline Phosphatase: 56 U/L (ref 38–126)
Anion gap: 9 (ref 5–15)
BUN: 26 mg/dL — ABNORMAL HIGH (ref 6–20)
CO2: 22 mmol/L (ref 22–32)
Calcium: 9.2 mg/dL (ref 8.9–10.3)
Chloride: 106 mmol/L (ref 98–111)
Creatinine, Ser: 0.88 mg/dL (ref 0.44–1.00)
GFR, Estimated: >60 mL/min (ref >60)
Glucose, Bld: 136 mg/dL — ABNORMAL HIGH (ref 70–99)
Potassium: 4.2 mmol/L (ref 3.5–5.1)
Sodium: 137 mmol/L (ref 135–145)
Total Bilirubin: 0.6 mg/dL (ref 0.3–1.2)
Total Protein: 6.5 g/dL (ref 6.5–8.1)

## 2022-03-10 LAB — CBC
HCT: 32.6 % — ABNORMAL LOW (ref 36.0–46.0)
Hemoglobin: 11.3 g/dL — ABNORMAL LOW (ref 12.0–15.0)
MCH: 30.3 pg (ref 26.0–34.0)
MCHC: 34.7 g/dL (ref 30.0–36.0)
MCV: 87.4 fL (ref 80.0–100.0)
Platelets: 329 10*3/uL (ref 150–400)
RBC: 3.73 MIL/uL — ABNORMAL LOW (ref 3.87–5.11)
RDW: 13 % (ref 11.5–15.5)
WBC: 7 10*3/uL (ref 4.0–10.5)
nRBC: 0 % (ref 0.0–0.2)

## 2022-03-10 LAB — URINE CULTURE: Culture: >=100000 — AB

## 2022-03-10 LAB — VITAMIN B12: Vitamin B-12: 1678 pg/mL — ABNORMAL HIGH (ref 180–914)

## 2022-03-10 LAB — GLUCOSE, CAPILLARY: Glucose-Capillary: 143 mg/dL — ABNORMAL HIGH (ref 70–99)

## 2022-03-10 LAB — TSH: TSH: 0.543 u[IU]/mL (ref 0.350–4.500)

## 2022-03-10 LAB — HIV ANTIBODY (ROUTINE TESTING W REFLEX): HIV Screen 4th Generation wRfx: NONREACTIVE

## 2022-03-10 MED ORDER — GABAPENTIN 400 MG PO CAPS: 400.0000 mg | ORAL_CAPSULE | Freq: Three times a day (TID) | ORAL | 0 refills | Status: DC

## 2022-03-10 MED ORDER — ESOMEPRAZOLE MAGNESIUM 40 MG PO CPDR: 40.0000 mg | DELAYED_RELEASE_CAPSULE | Freq: Two times a day (BID) | ORAL | 1 refills | Status: DC

## 2022-03-10 MED ORDER — OXYCODONE HCL 10 MG PO TABS: 10.0000 mg | ORAL_TABLET | Freq: Four times a day (QID) | ORAL | 0 refills | Status: AC | PRN

## 2022-03-10 MED ORDER — AMITRIPTYLINE HCL 50 MG PO TABS: 50.0000 mg | ORAL_TABLET | Freq: Every day | ORAL | 0 refills | Status: DC

## 2022-03-10 MED ORDER — OXYCODONE HCL 10 MG PO TABS: 10.0000 mg | ORAL_TABLET | Freq: Four times a day (QID) | ORAL | 0 refills | Status: DC | PRN

## 2022-03-10 MED ORDER — CEPHALEXIN 500 MG PO CAPS: 500.0000 mg | ORAL_CAPSULE | Freq: Three times a day (TID) | ORAL | 0 refills | Status: DC

## 2022-03-10 MED ORDER — ONDANSETRON HCL 4 MG PO TABS: 4.0000 mg | ORAL_TABLET | Freq: Three times a day (TID) | ORAL | 0 refills | Status: DC | PRN

## 2022-03-10 MED ORDER — CEPHALEXIN 500 MG PO CAPS: 500.0000 mg | ORAL_CAPSULE | Freq: Three times a day (TID) | ORAL | 0 refills | Status: AC

## 2022-03-10 NOTE — Progress Notes (Signed)
Assessment unchanged. Pt verbalized understanding of dc instructions through teach back including medications and follow up care. Discharged via wc to front entrance accompanied by NT.

## 2022-03-10 NOTE — Plan of Care (Signed)

## 2022-03-10 NOTE — Discharge Summary (Signed)
Physician Discharge Summary  [name] OIN:[id number] DOB: 1985-09-16 DOA: 03/09/2022  PCP: Jeanie Sewer, NP  Admit date: 03/09/2022 Discharge date: 03/10/2022  Admitted From: Home Disposition: Home  Recommendations for Outpatient Follow-up:  Follow up with PCP in 1-2 weeks Please obtain BMP/CBC in one week Follow-up with primary neurologist on 8/14 to discuss further management of neuropathy  Home Health: Equipment/Devices:  Discharge Condition: Stable CODE STATUS: Full code Diet recommendation: Heart healthy, carb modified  Brief/Interim Summary: [name] is a 35 y.o. female with medical history significant of type 1 diabetes, diabetic neuropathy, who complains of pain in her feet that has been ongoing for the last few years.  She reports that in the past few weeks, it has gotten substantially worse.  She says due to pain, she is unable to sleep, eat.  She has had nausea and vomiting for the past several days with minimal p.o. intake.  Reports that vomiting even occurs after water intake.  She has not had any diarrhea.  She denies any abdominal pain.  She has not had any fevers.  She denies any dysuria.  She feels that her neuropathic pain has gotten significantly worse.  This is located in her feet bilaterally up to her ankles.  Described as numbness, tingling, stabbing, pins-and-needles, burning type pain   ED Course: She presented to the emergency room at [facility] where initial labs showed that she had acute kidney injury with a creatinine of 2.4.  CBC unremarkable.  Urinalysis indicated possible infection.  Beta hydroxybutyric acid elevated at 3.2, but blood glucose normal at 131.  Urine toxicology screen was negative  Discharge Diagnoses:  Principal Problem:   AKI (acute kidney injury) ([place]) Active Problems:   Uncontrolled type 1 diabetes mellitus with hyperglycemia (HCC)   Diabetic polyneuropathy associated with type 1 diabetes  mellitus (HCC)   Nausea and vomiting   Gastroesophageal reflux disease with esophagitis without hemorrhage   UTI (urinary tract infection)   Chronic pain syndrome  Acute kidney injury -Related to decreased p.o. intake and recurrent vomiting -She also admits to taking ibuprofen recently for pain -Renal ultrasound unremarkable -She was treated with IV fluids and creatinine has since normalized   Diabetic neuropathy Chronic pain syndrome -Reports taking hydrocodone at home, but had run out of it recently since she required extra doses due to worsening pain -She was chronically taking gabapentin 200 mg 3 times daily, but did not feel it was beneficial.  She reports being on Lyrica in the past and did not tolerate Cymbalta in the past. -Gabapentin dose increased during her hospital stay to 400 mg 3 times daily, also started on amitriptyline nightly -She was also on opiate medications as needed -TSH and B12 were noncontributory -Although she still has some pain, she is feeling mildly better and was able to sleep overnight -She plans on following up with her primary neurologist to discuss further medication adjustments   Nausea and vomiting -Etiology is unclear although she does feel that her symptoms are subsiding and wants to try to eat -She was offered antiemetics -Diet was advanced and she is now tolerating solid diet without any recurrent symptoms   Possible UTI -Started on Rocephin -Urine culture currently in process -Transition to Keflex to empirically treat   Type 1 diabetes mellitus -Resume home dose of Levemir and NovoLog for sliding scale -A1c is 7.7   GERD -Continue on PPI   Vulvar/perianal dysplastic lesions -Recent ablation/excision of these lesions by GYN oncology/general  surgery -She already has follow-up appointment scheduled with gynecology.  Discharge Instructions  Discharge Instructions     Diet - low sodium heart healthy   Complete by: As directed     Increase activity slowly   Complete by: As directed       Allergies as of 03/10/2022       Reactions   Pramipexole Shortness Of Breath        Medication List     STOP taking these medications    acetaminophen-codeine 300-30 MG tablet Commonly known as: TYLENOL #3   Doxepin HCl 3 MG Tabs   HYDROcodone-acetaminophen 10-325 MG tablet Commonly known as: NORCO   ibuprofen 800 MG tablet Commonly known as: ADVIL   lisinopril 2.5 MG tablet Commonly known as: Zestril   nitrofurantoin (macrocrystal-monohydrate) 100 MG capsule Commonly known as: Macrobid   senna 8.6 MG Tabs tablet Commonly known as: SENOKOT       TAKE these medications    amitriptyline 50 MG tablet Commonly known as: ELAVIL Take 1 tablet (50 mg total) by mouth at bedtime.   B-D UF III MINI PEN NEEDLES 31G X 5 MM Misc Generic drug: Insulin Pen Needle To use with Insulin pens, as directed   cephALEXin 500 MG capsule Commonly known as: KEFLEX Take 1 capsule (500 mg total) by mouth 3 (three) times daily for 5 days.   cyanocobalamin 1000 MCG/ML injection Commonly known as: (VITAMIN B-12) Inject into the muscle. monthly   Dexcom G6 Transmitter Misc Inject 1 each into the skin every 14 (fourteen) days.   esomeprazole 40 MG capsule Commonly known as: NEXIUM Take 1 capsule (40 mg total) by mouth 2 (two) times daily before a meal.   gabapentin 400 MG capsule Commonly known as: NEURONTIN Take 1 capsule (400 mg total) by mouth 3 (three) times daily. What changed:  medication strength how much to take when to take this   Insulin Syringes (Disposable) U-100 1 ML Misc Inject 1 each into the skin in the morning and at bedtime.   Levemir FlexTouch 100 UNIT/ML FlexPen Generic drug: insulin detemir 50 UNITS AT BEDTIME, 40 UNITS EACH AM *D/C LEVEMIR VIALS What changed: additional instructions   lidocaine 5 % ointment Commonly known as: XYLOCAINE Apply 1 Application topically as needed (to the  vulva).   NovoLOG FlexPen 100 UNIT/ML FlexPen Generic drug: insulin aspart Inject 15-20 Units into the skin 3 (three) times daily with meals. plus sliding scale   ondansetron 4 MG tablet Commonly known as: ZOFRAN Take 1 tablet (4 mg total) by mouth every 8 (eight) hours as needed for nausea or vomiting. What changed: See the new instructions.   Oxycodone HCl 10 MG Tabs Take 1 tablet (10 mg total) by mouth every 6 (six) hours as needed for up to 5 days for moderate pain.   phentermine 37.5 MG tablet Commonly known as: ADIPEX-P Take 37.5 mg by mouth daily.        Follow-up Information     Boyd Kerbs, MD Follow up.   Specialty: Specialist Why: follow up with neurologist to discuss further management of neuropathy Contact information: [address] [place] [address] [phone]                Allergies  Allergen Reactions   Pramipexole Shortness Of Breath    Consultations:    Procedures/Studies: US RENAL  Result Date: 03/09/2022 CLINICAL DATA:  Acute kidney injury EXAM: RENAL / URINARY TRACT ULTRASOUND COMPLETE COMPARISON:  None  Available. FINDINGS: Right Kidney: Renal measurements: 11.1 x 3.7 x 6.2 cm = volume: 134 mL. Echogenicity within normal limits. No mass or hydronephrosis visualized. Left Kidney: Renal measurements: 10.9 x 5.9 x 5.2 cm = volume: 175 mL. Echogenicity within normal limits. No mass or hydronephrosis visualized. Bladder: Appears normal for degree of bladder distention. Other: None. IMPRESSION: No ultrasound abnormality of the kidneys.  No hydronephrosis. Electronically Signed   By: Delanna Ahmadi M.D.   On: 03/09/2022 20:29      Subjective: Tolerating solid diet without vomiting.  Overall she is feeling better.  Still has neuropathy pain.  Discharge Exam: Vitals:   03/09/22 1654 03/09/22 2133 03/10/22 0201 03/10/22 0520  BP: 128/87 133/82 131/88 127/89  Pulse: (!) 105 (!) 108 96 95  Resp: '17 14 18 18   '$ Temp: 98.6 F (37 C) 98.3 F (36.8 C) 98.2 F (36.8 C) 98.5 F (36.9 C)  TempSrc: Oral Oral Oral Oral  SpO2: 100% 100% 99% 100%  Weight:      Height:        General: Pt is alert, awake, not in acute distress Cardiovascular: RRR, S1/S2 +, no rubs, no gallops Respiratory: CTA bilaterally, no wheezing, no rhonchi Abdominal: Soft, NT, ND, bowel sounds + Extremities: no edema, no cyanosis    The results of significant diagnostics from this hospitalization (including imaging, microbiology, ancillary and laboratory) are listed below for reference.     Microbiology: Recent Results (from the past 240 hour(s))  Culture, Urine     Status: Abnormal   Collection Time: 02/28/22  3:09 PM   Specimen: Urine, Clean Catch  Result Value Ref Range Status   Specimen Description   Final    URINE, CLEAN CATCH Performed at [facility], [address], [place], [place] [address]    Special Requests   Final    NONE Performed at [facility], [place] [address] [place], [place] [address]    Culture (A)  Final    >=100,000 COLONIES/mL ESCHERICHIA COLI >=100,000 COLONIES/mL LACTOBACILLUS SPECIES Standardized susceptibility testing for this organism is not available. Performed at [facility], [place] [address], [place], [place] [address]    Report Status 03/02/2022 FINAL  Final   Organism ID, Bacteria ESCHERICHIA COLI (A)  Final      Susceptibility   Escherichia coli - MIC*    AMPICILLIN 4 SENSITIVE Sensitive     CEFAZOLIN <=4 SENSITIVE Sensitive     CEFEPIME <=0.12 SENSITIVE Sensitive     CEFTRIAXONE <=0.25 SENSITIVE Sensitive     CIPROFLOXACIN <=0.25 SENSITIVE Sensitive     GENTAMICIN <=1 SENSITIVE Sensitive     IMIPENEM <=0.25 SENSITIVE Sensitive     NITROFURANTOIN <=16 SENSITIVE Sensitive     TRIMETH/SULFA <=20 SENSITIVE Sensitive     AMPICILLIN/SULBACTAM <=2 SENSITIVE Sensitive     PIP/TAZO <=4 SENSITIVE Sensitive     * >=100,000  COLONIES/mL ESCHERICHIA COLI     Labs: BNP (last 3 results) No results for input(s): "BNP" in the last 8760 hours. Basic Metabolic Panel: Recent Labs  Lab 03/09/22 1033 03/09/22 1130 03/10/22 0438  NA 130* 132* 137  K 6.7* 4.7 4.2  CL  --  95* 106  CO2  --  20* 22  GLUCOSE  --  131* 136*  BUN  --  36* 26*  CREATININE  --  2.49* 0.88  CALCIUM  --  10.2 9.2  MG  --  2.1  --   PHOS  --  5.7*  --    Liver Function Tests: Recent Labs  Lab 03/09/22 1130 03/10/22 0438  AST 9* 11*  ALT 12 14  ALKPHOS 68 56  BILITOT 0.4 0.6  PROT 7.6 6.5  ALBUMIN 4.5 3.3*   No results for input(s): "LIPASE", "AMYLASE" in the last 168 hours. No results for input(s): "AMMONIA" in the last 168 hours. CBC: Recent Labs  Lab 03/09/22 0916 03/09/22 1033 03/10/22 0438  WBC 7.6  --  7.0  NEUTROABS 4.5  --   --   HGB 13.7 12.6 11.3*  HCT 39.3 37.0 32.6*  MCV 86.2  --  87.4  PLT 400  --  329   Cardiac Enzymes: No results for input(s): "CKTOTAL", "CKMB", "CKMBINDEX", "TROPONINI" in the last 168 hours. BNP: Invalid input(s): "POCBNP" CBG: Recent Labs  Lab 03/09/22 0711 03/09/22 2139 03/10/22 0739  GLUCAP 114* 128* 143*   D-Dimer No results for input(s): "DDIMER" in the last 72 hours. Hgb A1c Recent Labs    03/09/22 0916  HGBA1C 7.7*   Lipid Profile No results for input(s): "CHOL", "HDL", "LDLCALC", "TRIG", "CHOLHDL", "LDLDIRECT" in the last 72 hours. Thyroid function studies Recent Labs    03/10/22 0438  TSH 0.543   Anemia work up Recent Labs    03/10/22 0438  VITAMINB12 1,678*   Urinalysis    Component Value Date/Time   COLORURINE YELLOW 03/09/2022 0916   APPEARANCEUR HAZY (A) 03/09/2022 0916   LABSPEC 1.009 03/09/2022 0916   PHURINE 5.5 03/09/2022 0916   GLUCOSEU NEGATIVE 03/09/2022 0916   HGBUR NEGATIVE 03/09/2022 0916   BILIRUBINUR NEGATIVE 03/09/2022 0916   BILIRUBINUR negative 11/08/2021 1424   KETONESUR 15 (A) 03/09/2022 0916   PROTEINUR 30 (A)  03/09/2022 0916   UROBILINOGEN 0.2 11/08/2021 1424   UROBILINOGEN 0.2 05/03/2021 0837   NITRITE NEGATIVE 03/09/2022 0916   LEUKOCYTESUR MODERATE (A) 03/09/2022 0916   Sepsis Labs Recent Labs  Lab 03/09/22 0916 03/10/22 0438  WBC 7.6 7.0   Microbiology Recent Results (from the past 240 hour(s))  Culture, Urine     Status: Abnormal   Collection Time: 02/28/22  3:09 PM   Specimen: Urine, Clean Catch  Result Value Ref Range Status   Specimen Description   Final    URINE, CLEAN CATCH Performed at [facility], [place] [address], [place], [place] [address]    Special Requests   Final    NONE Performed at [facility], [place] [address], [place], [place] [address]    Culture (A)  Final    >=100,000 COLONIES/mL ESCHERICHIA COLI >=100,000 COLONIES/mL LACTOBACILLUS SPECIES Standardized susceptibility testing for this organism is not available. Performed at [facility], [place] [address] [place], [place] [address]    Report Status 03/02/2022 FINAL  Final   Organism ID, Bacteria ESCHERICHIA COLI (A)  Final      Susceptibility   Escherichia coli - MIC*    AMPICILLIN 4 SENSITIVE Sensitive     CEFAZOLIN <=4 SENSITIVE Sensitive     CEFEPIME <=0.12 SENSITIVE Sensitive     CEFTRIAXONE <=0.25 SENSITIVE Sensitive     CIPROFLOXACIN <=0.25 SENSITIVE Sensitive     GENTAMICIN <=1 SENSITIVE Sensitive     IMIPENEM <=0.25 SENSITIVE Sensitive     NITROFURANTOIN <=16 SENSITIVE Sensitive     TRIMETH/SULFA <=20 SENSITIVE Sensitive     AMPICILLIN/SULBACTAM <=2 SENSITIVE Sensitive     PIP/TAZO <=4 SENSITIVE Sensitive     * >=100,000 COLONIES/mL ESCHERICHIA COLI  Time coordinating discharge: 62mns  SIGNED:   JKathie Dike MD  [facility] 03/10/2022, 11:30 AM   If 7PM-7AM, please contact night-coverage [email or web address]

## 2022-03-11 ENCOUNTER — Encounter: Payer: Self-pay | Admitting: Internal Medicine

## 2022-03-13 ENCOUNTER — Telehealth: Payer: Self-pay | Admitting: *Deleted

## 2022-03-13 NOTE — Telephone Encounter (Signed)
Patient called the office and stated "I had surgery with Dr Berline Lopes on 7/7. I wanted to know if I can start shaving or waxing?" Per Dr Berline Lopes patient needs to wait until her post op appt on 8/14; give the area more time to heal. Patient verbalized understanding

## 2022-03-27 ENCOUNTER — Other Ambulatory Visit: Payer: Self-pay | Admitting: Family

## 2022-03-27 DIAGNOSIS — G4709 Other insomnia: Secondary | ICD-10-CM

## 2022-04-01 ENCOUNTER — Other Ambulatory Visit: Payer: Self-pay

## 2022-04-01 ENCOUNTER — Encounter: Payer: Self-pay | Admitting: Gynecologic Oncology

## 2022-04-01 ENCOUNTER — Inpatient Hospital Stay: Payer: BC Managed Care – PPO | Attending: Gynecologic Oncology | Admitting: Gynecologic Oncology

## 2022-04-01 VITALS — BP 117/77 | HR 108 | Temp 97.7°F | Resp 19 | Wt 164.7 lb

## 2022-04-01 DIAGNOSIS — B3731 Acute candidiasis of vulva and vagina: Secondary | ICD-10-CM

## 2022-04-01 DIAGNOSIS — Z9889 Other specified postprocedural states: Secondary | ICD-10-CM

## 2022-04-01 DIAGNOSIS — Z7189 Other specified counseling: Secondary | ICD-10-CM

## 2022-04-01 DIAGNOSIS — N901 Moderate vulvar dysplasia: Secondary | ICD-10-CM

## 2022-04-01 DIAGNOSIS — B379 Candidiasis, unspecified: Secondary | ICD-10-CM

## 2022-04-01 MED ORDER — FLUCONAZOLE 150 MG PO TABS: 150.0000 mg | ORAL_TABLET | Freq: Every day | ORAL | 0 refills | Status: AC

## 2022-04-01 NOTE — Progress Notes (Signed)
Gynecologic Oncology Return Clinic Visit  04/01/2022  Reason for Visit: Follow-up after recent surgery  Treatment History: Patient initially was seen last summer in July with "vaginal bumps" and pruritus.  She thought initially that she had a yeast infection.  Shortly after that time, she felt "bumps" on the backside of the vaginal opening.  She went to urgent care and given symptoms and findings on exam, patient was treated for yeast infection with Diflucan.  She denies any improvement in her symptoms.  She went back to urgent care in September with similar vaginal symptoms as well as some urinary symptoms.  She was treated presumptively for UTI and was treated again for Candida infection and for BV.  She ultimately was seen by OB/GYN in March.  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   02/22/22: CO2 laser of the vulva and excision of anal skin tags  Postoperative course was complicated by acute urinary retention requiring Foley placement.  Patient was also admitted briefly in the setting of AKI and poor p.o. intake secondary to nausea and emesis.  Interval History: Patient reports continued vaginal discharge since surgery, overall less.  She has developed more recently some burning with urination on her vulva at the site of the laser.  Lidocaine helps.  She has had some spotting since surgery, not recently.  Notes vaginal discharge.  She has changed her detergent recently, is unsure whether this has contributed to her symptoms.  Reports having regular bowel function.  Past Medical/Surgical History: Past Medical History:  Diagnosis Date   Chronic constipation 11/08/2021   Diabetic neuropathy ([place])    History of chicken pox    Type 1 diabetes ([place])    Vaginal odor 11/08/2021    Past Surgical History:  Procedure Laterality Date   CESAREAN SECTION     only one C/S per pt.   CO2 LASER APPLICATION N/A 08/24/1094   Procedure: CO2 LASER  APPLICATION TO VULVA;  Surgeon: Lafonda Mosses, MD;  Location: [facility];  Service: General;  Laterality: N/A;   DILATION AND CURETTAGE OF UTERUS     EXCISION OF SKIN TAG N/A 02/22/2022   Procedure: EXCISION OF ANAL SKIN TAGS;  Surgeon: Leighton Ruff, MD;  Location: [facility];  Service: General;  Laterality: N/A;    Family History  Problem Relation Age of Onset   Hypertension Mother    Glaucoma Mother    Diabetes Maternal Grandmother    Neuropathy Neg Hx    Colon cancer Neg Hx    Stomach cancer Neg Hx    Esophageal cancer Neg Hx    Colon polyps Neg Hx    Rectal cancer Neg Hx     Social History   Socioeconomic History   Marital status: Married    Spouse name: Not on file   Number of children: 2   Years of education: Not on file   Highest education level: High school graduate  Occupational History   Occupation: Unemployed  Tobacco Use   Smoking status: Never    Passive exposure: Never   Smokeless tobacco: Never  Vaping Use   Vaping Use: Never used  Substance and Sexual Activity   Alcohol use: No   Drug use: Never   Sexual activity: Yes    Birth control/protection: None  Other Topics Concern   Not on file  Social History Narrative   Lives at home with her husband and children   Right handed  Caffeine: tea, 1 cup in the morning.   Social Determinants of Health   Financial Resource Strain: Not on file  Food Insecurity: Not on file  Transportation Needs: Not on file  Physical Activity: Not on file  Stress: Not on file  Social Connections: Not on file    Current Medications:  Current Outpatient Medications:    amitriptyline (ELAVIL) 50 MG tablet, Take 1 tablet (50 mg total) by mouth at bedtime., Disp: 30 tablet, Rfl: 0   Continuous Blood Gluc Transmit (DEXCOM G6 TRANSMITTER) MISC, Inject 1 each into the skin every 14 (fourteen) days., Disp: 2 each, Rfl: 5   cyanocobalamin (,VITAMIN B-12,) 1000 MCG/ML injection, Inject into  the muscle. monthly, Disp: , Rfl:    esomeprazole (NEXIUM) 40 MG capsule, Take 1 capsule (40 mg total) by mouth 2 (two) times daily before a meal., Disp: 180 capsule, Rfl: 1   gabapentin (NEURONTIN) 400 MG capsule, Take 1 capsule (400 mg total) by mouth 3 (three) times daily., Disp: 90 capsule, Rfl: 0   insulin detemir (LEVEMIR FLEXTOUCH) 100 UNIT/ML FlexPen, 50 UNITS AT BEDTIME, 40 UNITS EACH AM *D/C LEVEMIR VIALS (Patient taking differently: 600 UNITS AT BEDTIME, 40 UNITS EACH AM *D/C LEVEMIR VIALS), Disp: 15 mL, Rfl: 0   Insulin Pen Needle (B-D UF III MINI PEN NEEDLES) 31G X 5 MM MISC, To use with Insulin pens, as directed, Disp: 100 each, Rfl: 3   Insulin Syringes, Disposable, U-100 1 ML MISC, Inject 1 each into the skin in the morning and at bedtime., Disp: 100 each, Rfl: 3   ondansetron (ZOFRAN) 4 MG tablet, Take 1 tablet (4 mg total) by mouth every 8 (eight) hours as needed for nausea or vomiting., Disp: 30 tablet, Rfl: 0   phentermine (ADIPEX-P) 37.5 MG tablet, Take 37.5 mg by mouth daily., Disp: , Rfl:    insulin aspart (NOVOLOG FLEXPEN) 100 UNIT/ML FlexPen, Inject 15-20 Units into the skin 3 (three) times daily with meals. plus sliding scale, Disp: 35 mL, Rfl: 5  Review of Systems: Denies appetite changes, fevers, chills, fatigue, unexplained weight changes. Denies hearing loss, neck lumps or masses, mouth sores, ringing in ears or voice changes. Denies cough or wheezing.  Denies shortness of breath. Denies chest pain or palpitations. Denies leg swelling. Denies abdominal distention, pain, blood in stools, constipation, diarrhea, nausea, vomiting, or early satiety. Denies pain with intercourse, frequency, hematuria or incontinence. Denies hot flashes, pelvic pain.   Denies joint pain, back pain or muscle pain/cramps. Denies itching, rash, or wounds. Denies dizziness, headaches, numbness or seizures. Denies swollen lymph nodes or glands, denies easy bruising or bleeding. Denies  anxiety, depression, confusion, or decreased concentration.  Physical Exam: BP 117/77 Comment (BP Location): left arm Comment (Patient Position): sitting Comment (Cuff Size): adult  Pulse (!) 115   Temp 97.7 F (36.5 C) (Axillary)   Resp 19   Wt 164 lb 11.2 oz (74.7 kg)   SpO2 100%   BMI 26.58 kg/m  General: Alert, oriented, no acute distress. HEENT: Normocephalic, atraumatic, sclera anicteric. Chest: Unlabored breathing on room air. Extremities: Grossly normal range of motion.  Warm, well perfused.  No edema bilaterally. Skin: No rashes or lesions noted. GU: Significant erythema of the vulva as well as white discharge.  Posterior fourchette with some mild exudate and erythema, no active bleeding.  Laboratory & Radiologic Studies: A.   VAGINA, INTROITUS, 5 O'CLOCK, BIOPSY:  Findings consistent with condyloma acuminatum.  No high-grade dysplasia or malignancy is seen.   B.  PERIANAL, 7 O'CLOCK, BIOPSY:  Findings consistent with condyloma acuminatum.  No high-grade dysplasia or malignancy is seen.   C.   ANAL SKIN TAG, ANTERIOR, EXCISION:  Findings consistent with fibroepithelial polyp.  No dysplasia or malignancy is seen.   D.   ANAL SKIN TAG, POSTERIOR, EXCISION:  Findings consistent with fibroepithelial polyp.  No dysplasia or malignancy is seen.   Assessment & Plan: [name] is a 36 y.o. woman with history of high-grade vulvar dysplasia, status post vulvar laser ablation in early July.  2 biopsies taken at that time showed condyloma acuminata, no high-grade dysplasia or malignancy.  Patient appears to have Candida infection causing significant irritation of her vulva.  I suspect this is in the setting of recent surgery as well as poorly controlled diabetes.  Prescription sent for Diflucan with a second dose if no improvement in her symptoms within 48 hours.  I have also recommended that she start doing sitz bath's twice a day, work on glycemic control, and  revert back to prior detergent she was using.  She will continue using a squirt bottle to rinse after urinating.  We will see her back in 2 weeks for repeat exam.  Reviewed pathology from surgery.  Biopsies of the perineum showed condyloma, no high-grade dysplasia.  Given risk of recurrence of vulvar dysplasia, we reviewed the need for closer surveillance.  I recommend that the patient see her OB/GYN every 6 months for 5 years and then annually.  I stressed the importance of calling to be seen sooner if she develops any new vulvar symptoms or lesions between visits.  20 minutes of total time was spent for this patient encounter, including preparation, face-to-face counseling with the patient and coordination of care, and documentation of the encounter.  Jeral Pinch, MD  Division of Gynecologic Oncology  Department of Obstetrics and Gynecology  [facility]

## 2022-04-01 NOTE — Patient Instructions (Addendum)
It was good to see you today.    I think you have developed a yeast infection after surgery.  You are overall healing well but things look very irritated.  I am sending a prescription in from medication.  I want you to take one dose initially.  If no significant improvement in your symptoms, please take a second dose 48 hours later.  If no improvement in your symptoms after the second dose, please call my clinic.  It is really important to keep your blood sugars well controlled.  Please start doing sitz bath's twice a day.  You can put some Epsom salts or a green tea bag in the water.  Please go back to your original detergent.

## 2022-04-17 ENCOUNTER — Encounter: Payer: Self-pay | Admitting: Gynecologic Oncology

## 2022-04-18 ENCOUNTER — Inpatient Hospital Stay (HOSPITAL_BASED_OUTPATIENT_CLINIC_OR_DEPARTMENT_OTHER): Payer: BC Managed Care – PPO | Admitting: Gynecologic Oncology

## 2022-04-18 VITALS — BP 114/86 | HR 113 | Temp 98.8°F | Resp 18 | Ht 66.0 in | Wt 166.0 lb

## 2022-04-18 DIAGNOSIS — Z9889 Other specified postprocedural states: Secondary | ICD-10-CM

## 2022-04-18 DIAGNOSIS — N901 Moderate vulvar dysplasia: Secondary | ICD-10-CM

## 2022-04-18 NOTE — Patient Instructions (Signed)
It was good to see you.  Your bottom looks better after the yeast infection but the area that was lasered is still healing more slowly than expected.  I would like to see you back in a month for another exam.  Please continue to work on keeping your sugars as well-controlled as you can.  Continue using the PERI bottle and sitz bath's.

## 2022-04-18 NOTE — Progress Notes (Signed)
Gynecologic Oncology Return Clinic Visit  04/18/2022  Reason for Visit: Follow-up after surgery  Treatment History: Patient initially was seen last summer in July with "vaginal bumps" and pruritus.  She thought initially that she had a yeast infection.  Shortly after that time, she felt "bumps" on the backside of the vaginal opening.  She went to urgent care and given symptoms and findings on exam, patient was treated for yeast infection with Diflucan.  She denies any improvement in her symptoms.  She went back to urgent care in September with similar vaginal symptoms as well as some urinary symptoms.  She was treated presumptively for UTI and was treated again for Candida infection and for BV.  She ultimately was seen by OB/GYN in March.  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   02/22/22: CO2 laser of the vulva and excision of anal skin tags   Postoperative course was complicated by acute urinary retention requiring Foley placement.  Patient was also admitted briefly in the setting of AKI and poor p.o. intake secondary to nausea and emesis.  Interval History: Reports improvement in her symptoms after treatment for yeast.  Continues to have some discharge, overall improved.  Also has some irritation.  Reports normal bladder function.  Sugars have been running 1 50-200.  Past Medical/Surgical History: Past Medical History:  Diagnosis Date   Chronic constipation 11/08/2021   Diabetic neuropathy ([place])    History of chicken pox    Type 1 diabetes ([place])    Vaginal odor 11/08/2021    Past Surgical History:  Procedure Laterality Date   CESAREAN SECTION     only one C/S per pt.   CO2 LASER APPLICATION N/A 12/18/7780   Procedure: CO2 LASER APPLICATION TO VULVA;  Surgeon: Lafonda Mosses, MD;  Location: [place];  Service: General;  Laterality: N/A;   DILATION AND CURETTAGE OF UTERUS     EXCISION OF SKIN TAG N/A  02/22/2022   Procedure: EXCISION OF ANAL SKIN TAGS;  Surgeon: Leighton Ruff, MD;  Location: [place];  Service: General;  Laterality: N/A;    Family History  Problem Relation Age of Onset   Hypertension Mother    Glaucoma Mother    Diabetes Maternal Grandmother    Neuropathy Neg Hx    Colon cancer Neg Hx    Stomach cancer Neg Hx    Esophageal cancer Neg Hx    Colon polyps Neg Hx    Rectal cancer Neg Hx     Social History   Socioeconomic History   Marital status: Married    Spouse name: Not on file   Number of children: 2   Years of education: Not on file   Highest education level: High school graduate  Occupational History   Occupation: Unemployed  Tobacco Use   Smoking status: Never    Passive exposure: Never   Smokeless tobacco: Never  Vaping Use   Vaping Use: Never used  Substance and Sexual Activity   Alcohol use: No   Drug use: Never   Sexual activity: Yes    Birth control/protection: None  Other Topics Concern   Not on file  Social History Narrative   Lives at home with her husband and children   Right handed   Caffeine: tea, 1 cup in the morning.   Social Determinants of Health   Financial Resource Strain: Not on file  Food Insecurity: Not on file  Transportation Needs: Not on file  Physical Activity: Not on file  Stress: Not on file  Social Connections: Not on file    Current Medications:  Current Outpatient Medications:    Continuous Blood Gluc Transmit (DEXCOM G6 TRANSMITTER) MISC, Inject 1 each into the skin every 14 (fourteen) days., Disp: 2 each, Rfl: 5   cyanocobalamin (,VITAMIN B-12,) 1000 MCG/ML injection, Inject into the muscle. monthly, Disp: , Rfl:    gabapentin (NEURONTIN) 400 MG capsule, Take 1 capsule (400 mg total) by mouth 3 (three) times daily., Disp: 90 capsule, Rfl: 0   insulin detemir (LEVEMIR FLEXTOUCH) 100 UNIT/ML FlexPen, 50 UNITS AT BEDTIME, 40 UNITS EACH AM *D/C LEVEMIR VIALS (Patient taking differently:  600 UNITS AT BEDTIME, 40 UNITS EACH AM *D/C LEVEMIR VIALS), Disp: 15 mL, Rfl: 0   Insulin Pen Needle (B-D UF III MINI PEN NEEDLES) 31G X 5 MM MISC, To use with Insulin pens, as directed, Disp: 100 each, Rfl: 3   Insulin Syringes, Disposable, U-100 1 ML MISC, Inject 1 each into the skin in the morning and at bedtime., Disp: 100 each, Rfl: 3   ondansetron (ZOFRAN) 4 MG tablet, Take 1 tablet (4 mg total) by mouth every 8 (eight) hours as needed for nausea or vomiting., Disp: 30 tablet, Rfl: 0   Oxycodone HCl 10 MG TABS, Take 10 mg by mouth 3 (three) times daily as needed., Disp: , Rfl:    phentermine (ADIPEX-P) 37.5 MG tablet, Take 37.5 mg by mouth daily., Disp: , Rfl:    insulin aspart (NOVOLOG FLEXPEN) 100 UNIT/ML FlexPen, Inject 15-20 Units into the skin 3 (three) times daily with meals. plus sliding scale, Disp: 35 mL, Rfl: 5   traZODone (DESYREL) 100 MG tablet, Take 100 mg by mouth at bedtime., Disp: , Rfl:   Review of Systems: + vaginal discharge Denies appetite changes, fevers, chills, fatigue, unexplained weight changes. Denies hearing loss, neck lumps or masses, mouth sores, ringing in ears or voice changes. Denies cough or wheezing.  Denies shortness of breath. Denies chest pain or palpitations. Denies leg swelling. Denies abdominal distention, pain, blood in stools, constipation, diarrhea, nausea, vomiting, or early satiety. Denies pain with intercourse, dysuria, frequency, hematuria or incontinence. Denies hot flashes, pelvic pain, vaginal bleeding.   Denies joint pain, back pain or muscle pain/cramps. Denies itching, rash, or wounds. Denies dizziness, headaches, numbness or seizures. Denies swollen lymph nodes or glands, denies easy bruising or bleeding. Denies anxiety, depression, confusion, or decreased concentration.  Physical Exam: BP 114/86 (BP Location: Left Arm, Patient Position: Sitting)   Pulse (!) 113   Temp 98.8 F (37.1 C) (Oral)   Resp 18   Ht '5\' 6"'$  (1.676 m)    Wt 166 lb (75.3 kg)   SpO2 100%   BMI 26.79 kg/m  General: Alert, oriented, no acute distress. HEENT: Normocephalic, atraumatic, sclera anicteric. Chest: Unlabored breathing on room air.  GU: Resolution of vulvar erythema.  Posterior fourchette with some minimal exudate and very mild erythema.  Healing area of the posterior fourchette treated with silver nitrate.  Laboratory & Radiologic Studies: None new  Assessment & Plan: [name] is a 36 y.o. woman with history of high-grade vulvar dysplasia, status post vulvar laser ablation in early July.  2 biopsies taken at that time showed condyloma acuminata, no high-grade dysplasia or malignancy.  At her postoperative visit, significant erythema and discharge consistent with yeast infection.  Patient overall is doing better from a symptom standpoint after treatment for candidiasis.  Her posterior fourchette continues to  heal more slowly than expected, which I think is partially related to her poorly controlled diabetes.  Discussed the importance of keeping her blood sugar as controlled as possible.  Encouraged her to continue using the PERI bottle and doing sitz bath's.  I will see her back in a month to assure continued and hopefully complete healing.  14 minutes of total time was spent for this patient encounter, including preparation, face-to-face counseling with the patient and coordination of care, and documentation of the encounter.  Jeral Pinch, MD  Division of Gynecologic Oncology  Department of Obstetrics and Gynecology  [facility]

## 2022-04-25 ENCOUNTER — Telehealth: Payer: Self-pay | Admitting: Family

## 2022-04-25 NOTE — Telephone Encounter (Signed)
I have left patient vm to call back to schedule transfer to MD per Hudnell's request.  Believes patient would be a better fit with an MD due to health history.

## 2022-05-07 ENCOUNTER — Encounter (HOSPITAL_COMMUNITY): Payer: Self-pay

## 2022-05-07 ENCOUNTER — Encounter (HOSPITAL_BASED_OUTPATIENT_CLINIC_OR_DEPARTMENT_OTHER): Payer: Self-pay | Admitting: Emergency Medicine

## 2022-05-07 ENCOUNTER — Emergency Department (HOSPITAL_BASED_OUTPATIENT_CLINIC_OR_DEPARTMENT_OTHER): Payer: BC Managed Care – PPO

## 2022-05-07 ENCOUNTER — Other Ambulatory Visit: Payer: Self-pay

## 2022-05-07 ENCOUNTER — Observation Stay (HOSPITAL_BASED_OUTPATIENT_CLINIC_OR_DEPARTMENT_OTHER)
Admission: EM | Admit: 2022-05-07 | Discharge: 2022-05-10 | Disposition: A | Discharge status: Home / Self Care | Payer: BC Managed Care – PPO | Attending: Internal Medicine | Admitting: Internal Medicine

## 2022-05-07 DIAGNOSIS — R112 Nausea with vomiting, unspecified: Principal | ICD-10-CM | POA: Diagnosis present

## 2022-05-07 DIAGNOSIS — T402X1A Poisoning by other opioids, accidental (unintentional), initial encounter: Secondary | ICD-10-CM | POA: Insufficient documentation

## 2022-05-07 DIAGNOSIS — E1065 Type 1 diabetes mellitus with hyperglycemia: Secondary | ICD-10-CM | POA: Diagnosis not present

## 2022-05-07 DIAGNOSIS — E860 Dehydration: Secondary | ICD-10-CM | POA: Insufficient documentation

## 2022-05-07 DIAGNOSIS — G894 Chronic pain syndrome: Secondary | ICD-10-CM | POA: Diagnosis not present

## 2022-05-07 DIAGNOSIS — E1069 Type 1 diabetes mellitus with other specified complication: Secondary | ICD-10-CM

## 2022-05-07 DIAGNOSIS — E1040 Type 1 diabetes mellitus with diabetic neuropathy, unspecified: Secondary | ICD-10-CM | POA: Insufficient documentation

## 2022-05-07 DIAGNOSIS — E1010 Type 1 diabetes mellitus with ketoacidosis without coma: Secondary | ICD-10-CM | POA: Diagnosis not present

## 2022-05-07 DIAGNOSIS — E8889 Other specified metabolic disorders: Secondary | ICD-10-CM

## 2022-05-07 DIAGNOSIS — R1084 Generalized abdominal pain: Secondary | ICD-10-CM | POA: Insufficient documentation

## 2022-05-07 DIAGNOSIS — Z79899 Other long term (current) drug therapy: Secondary | ICD-10-CM | POA: Insufficient documentation

## 2022-05-07 DIAGNOSIS — Z794 Long term (current) use of insulin: Secondary | ICD-10-CM | POA: Insufficient documentation

## 2022-05-07 DIAGNOSIS — E876 Hypokalemia: Secondary | ICD-10-CM | POA: Insufficient documentation

## 2022-05-07 DIAGNOSIS — F1990 Other psychoactive substance use, unspecified, uncomplicated: Secondary | ICD-10-CM | POA: Diagnosis present

## 2022-05-07 LAB — LACTIC ACID, PLASMA
Lactic Acid, Venous: 1.8 mmol/L (ref 0.5–1.9)
Lactic Acid, Venous: 3.1 mmol/L (ref 0.5–1.9)
Lactic Acid, Venous: 1.9 mmol/L (ref 0.5–1.9)

## 2022-05-07 LAB — COMPREHENSIVE METABOLIC PANEL WITH GFR
ALT: 7 U/L (ref 0–44)
AST: 7 U/L — ABNORMAL LOW (ref 15–41)
Albumin: 4.7 g/dL (ref 3.5–5.0)
Alkaline Phosphatase: 68 U/L (ref 38–126)
Anion gap: 19 — ABNORMAL HIGH (ref 5–15)
BUN: 31 mg/dL — ABNORMAL HIGH (ref 6–20)
CO2: 22 mmol/L (ref 22–32)
Calcium: 10.1 mg/dL (ref 8.9–10.3)
Chloride: 93 mmol/L — ABNORMAL LOW (ref 98–111)
Creatinine, Ser: 1.06 mg/dL — ABNORMAL HIGH (ref 0.44–1.00)
GFR, Estimated: >60 mL/min
Glucose, Bld: 438 mg/dL — ABNORMAL HIGH (ref 70–99)
Potassium: 3.7 mmol/L (ref 3.5–5.1)
Sodium: 134 mmol/L — ABNORMAL LOW (ref 135–145)
Total Bilirubin: 0.4 mg/dL (ref 0.3–1.2)
Total Protein: 8 g/dL (ref 6.5–8.1)

## 2022-05-07 LAB — I-STAT VENOUS BLOOD GAS, ED
Acid-base deficit: 1 mmol/L (ref 0.0–2.0)
Bicarbonate: 25.3 mmol/L (ref 20.0–28.0)
Calcium, Ion: 1.25 mmol/L (ref 1.15–1.40)
HCT: 39 % (ref 36.0–46.0)
Hemoglobin: 13.3 g/dL (ref 12.0–15.0)
O2 Saturation: 53 %
Potassium: 3.6 mmol/L (ref 3.5–5.1)
Sodium: 131 mmol/L — ABNORMAL LOW (ref 135–145)
TCO2: 27 mmol/L (ref 22–32)
pCO2, Ven: 45.5 mmHg (ref 44–60)
pH, Ven: 7.353 (ref 7.25–7.43)
pO2, Ven: 30 mmHg — CL (ref 32–45)

## 2022-05-07 LAB — CBC WITH DIFFERENTIAL/PLATELET
Abs Immature Granulocytes: 0.03 K/uL (ref 0.00–0.07)
Basophils Absolute: 0 K/uL (ref 0.0–0.1)
Basophils Relative: 0 %
Eosinophils Absolute: 0 K/uL (ref 0.0–0.5)
Eosinophils Relative: 0 %
HCT: 36.6 % (ref 36.0–46.0)
Hemoglobin: 12.8 g/dL (ref 12.0–15.0)
Immature Granulocytes: 0 %
Lymphocytes Relative: 19 %
Lymphs Abs: 1.7 K/uL (ref 0.7–4.0)
MCH: 30.2 pg (ref 26.0–34.0)
MCHC: 35 g/dL (ref 30.0–36.0)
MCV: 86.3 fL (ref 80.0–100.0)
Monocytes Absolute: 0.5 K/uL (ref 0.1–1.0)
Monocytes Relative: 6 %
Neutro Abs: 6.4 K/uL (ref 1.7–7.7)
Neutrophils Relative %: 75 %
Platelets: 418 K/uL — ABNORMAL HIGH (ref 150–400)
RBC: 4.24 MIL/uL (ref 3.87–5.11)
RDW: 13.2 % (ref 11.5–15.5)
WBC: 8.6 K/uL (ref 4.0–10.5)
nRBC: 0 % (ref 0.0–0.2)

## 2022-05-07 LAB — BETA-HYDROXYBUTYRIC ACID: Beta-Hydroxybutyric Acid: 2.87 mmol/L — ABNORMAL HIGH (ref 0.05–0.27)

## 2022-05-07 LAB — URINALYSIS, ROUTINE W REFLEX MICROSCOPIC
Bilirubin Urine: NEGATIVE
Glucose, UA: >1000 mg/dL — AB
Ketones, ur: >80 mg/dL — AB
Leukocytes,Ua: NEGATIVE
Nitrite: NEGATIVE
Protein, ur: 30 mg/dL — AB
RBC / HPF: >50 RBC/hpf — ABNORMAL HIGH (ref 0–5)
Specific Gravity, Urine: >1.046 — ABNORMAL HIGH (ref 1.005–1.030)
pH: 7 (ref 5.0–8.0)

## 2022-05-07 LAB — PREGNANCY, URINE: Preg Test, Ur: NEGATIVE

## 2022-05-07 LAB — CBG MONITORING, ED
Glucose-Capillary: 291 mg/dL — ABNORMAL HIGH (ref 70–99)
Glucose-Capillary: 292 mg/dL — ABNORMAL HIGH (ref 70–99)
Glucose-Capillary: 387 mg/dL — ABNORMAL HIGH (ref 70–99)

## 2022-05-07 LAB — LIPASE, BLOOD: Lipase: <10 U/L — ABNORMAL LOW (ref 11–51)

## 2022-05-07 MED ORDER — SODIUM CHLORIDE 0.9 % IV SOLN
25.0000 mg | Freq: Four times a day (QID) | INTRAVENOUS | Status: DC | PRN
  Administered 2022-05-07 – 2022-05-08 (×2): 25 mg via INTRAVENOUS
  Filled 2022-05-07: qty 1
  Filled 2022-05-07: qty 25

## 2022-05-07 MED ORDER — MORPHINE SULFATE (PF) 4 MG/ML IV SOLN
4.0000 mg | Freq: Once | INTRAVENOUS | Status: DC
  Filled 2022-05-07: qty 1

## 2022-05-07 MED ORDER — IOHEXOL 300 MG/ML  SOLN
100.0000 mL | Freq: Once | INTRAMUSCULAR | Status: AC | PRN
  Administered 2022-05-07: 80 mL via INTRAVENOUS

## 2022-05-07 MED ORDER — HYDROMORPHONE HCL 1 MG/ML IJ SOLN
0.5000 mg | Freq: Once | INTRAMUSCULAR | Status: AC
  Administered 2022-05-07: 0.5 mg via INTRAVENOUS
  Filled 2022-05-07: qty 1

## 2022-05-07 MED ORDER — SODIUM CHLORIDE 0.9 % IV BOLUS (SEPSIS)
1000.0000 mL | Freq: Once | INTRAVENOUS | Status: AC
  Administered 2022-05-07: 1000 mL via INTRAVENOUS

## 2022-05-07 MED ORDER — LACTATED RINGERS IV BOLUS
1000.0000 mL | Freq: Once | INTRAVENOUS | Status: AC
  Administered 2022-05-07: 1000 mL via INTRAVENOUS

## 2022-05-07 MED ORDER — PROMETHAZINE HCL 25 MG/ML IJ SOLN
INTRAMUSCULAR | Status: AC
  Filled 2022-05-07: qty 1

## 2022-05-07 MED ORDER — KETOROLAC TROMETHAMINE 30 MG/ML IJ SOLN
30.0000 mg | Freq: Once | INTRAMUSCULAR | Status: AC
  Administered 2022-05-07: 30 mg via INTRAVENOUS
  Filled 2022-05-07: qty 1

## 2022-05-07 MED ORDER — HALOPERIDOL LACTATE 5 MG/ML IJ SOLN
3.0000 mg | Freq: Once | INTRAMUSCULAR | Status: AC
  Administered 2022-05-07: 3 mg via INTRAVENOUS
  Filled 2022-05-07: qty 1

## 2022-05-07 MED ORDER — DICYCLOMINE HCL 10 MG/ML IM SOLN
20.0000 mg | Freq: Once | INTRAMUSCULAR | Status: AC
  Administered 2022-05-07: 20 mg via INTRAMUSCULAR
  Filled 2022-05-07: qty 2

## 2022-05-07 MED ORDER — INSULIN ASPART 100 UNIT/ML IJ SOLN
6.0000 [IU] | Freq: Once | INTRAMUSCULAR | Status: AC
  Administered 2022-05-07: 6 [IU] via INTRAVENOUS

## 2022-05-07 MED ORDER — ONDANSETRON 4 MG PO TBDP
4.0000 mg | ORAL_TABLET | Freq: Once | ORAL | Status: AC
  Administered 2022-05-07: 4 mg via ORAL
  Filled 2022-05-07: qty 1

## 2022-05-07 NOTE — ED Provider Notes (Incomplete)
[facility] EMERGENCY DEPT Provider Note   CSN: [id number] Arrival date & time: 05/07/22  1344     History {Add pertinent medical, surgical, social history, OB history to HPI:1} Chief Complaint  Patient presents with  . Nausea    [name] is a 36 y.o. female.  HPI     36 year old female with a history of type 1 diabetes, diabetic neuropathy, presents with concern for nausea and vomiting, abdominal pain, worsening neuropathy pain.  N/V more than 5 times day for the last week, has not been able to keep anything down. Glucose running in 300s, no change in insulin Pain bilateral lower extremities neuropathy has been worse. Difficult sleeping Abdominal pain center of abdomen, "just sitting there", constant pain, dull Is passing flatus, last had BM, no diarrhea No urinary symptoms No fevers No vaginal bleeding or discharge No change in other medications No alcohol/cigarettes/other drugs No known sick contacts No cough, dyspnea, chest pain Headache Generally weak, fatigue Did run out of her prescribed pain medications because she was taking an extra pill a night and ran out early.    Past Medical History:  Diagnosis Date  . Chronic constipation 11/08/2021  . Diabetic neuropathy ([place])   . History of chicken pox   . Type 1 diabetes ([place])   . Vaginal odor 11/08/2021     Home Medications Prior to Admission medications   Medication Sig Start Date End Date Taking? Authorizing Provider  Continuous Blood Gluc Transmit (DEXCOM G6 TRANSMITTER) MISC Inject 1 each into the skin every 14 (fourteen) days. 12/31/21   Jeanie Sewer, NP  cyanocobalamin (,VITAMIN B-12,) 1000 MCG/ML injection Inject into the muscle. monthly 07/16/21   [provider]  gabapentin (NEURONTIN) 400 MG capsule Take 1 capsule (400 mg total) by mouth 3 (three) times daily. 03/10/22   Kathie Dike, MD  insulin aspart (NOVOLOG FLEXPEN) 100 UNIT/ML FlexPen Inject 15-20  Units into the skin 3 (three) times daily with meals. plus sliding scale 08/10/21 03/09/22  Jeanie Sewer, NP  insulin detemir (LEVEMIR FLEXTOUCH) 100 UNIT/ML FlexPen 50 UNITS AT BEDTIME, 40 UNITS EACH AM *D/C LEVEMIR VIALS Patient taking differently: 600 UNITS AT BEDTIME, 40 UNITS EACH AM *D/C LEVEMIR VIALS 01/01/22   Hudnell, Colletta Maryland, NP  Insulin Pen Needle (B-D UF III MINI PEN NEEDLES) 31G X 5 MM MISC To use with Insulin pens, as directed 11/20/21   Jeanie Sewer, NP  Insulin Syringes, Disposable, U-100 1 ML MISC Inject 1 each into the skin in the morning and at bedtime. 08/10/21   Jeanie Sewer, NP  ondansetron (ZOFRAN) 4 MG tablet Take 1 tablet (4 mg total) by mouth every 8 (eight) hours as needed for nausea or vomiting. 03/10/22   Kathie Dike, MD  Oxycodone HCl 10 MG TABS Take 10 mg by mouth 3 (three) times daily as needed. 03/16/22   [provider]  phentermine (ADIPEX-P) 37.5 MG tablet Take 37.5 mg by mouth daily. 01/15/22   [provider]  traZODone (DESYREL) 100 MG tablet Take 100 mg by mouth at bedtime. 03/18/22   [provider]      Allergies    Pramipexole    Review of Systems   Review of Systems  Physical Exam Updated Vital Signs BP 109/89 (BP Location: Right Arm)   Pulse 96   Temp 98.2 F (36.8 C)   Resp 16   Ht '5\' 6"'$  (1.676 m)   Wt 74.8 kg   LMP 04/08/2022 (Approximate)   SpO2 100%  BMI 26.63 kg/m  Physical Exam Vitals and nursing note reviewed.  Constitutional:      General: She is not in acute distress.    Appearance: She is well-developed. She is ill-appearing. She is not diaphoretic.  HENT:     Head: Normocephalic and atraumatic.  Eyes:     Conjunctiva/sclera: Conjunctivae normal.  Cardiovascular:     Rate and Rhythm: Normal rate and regular rhythm.     Pulses: Normal pulses.  Pulmonary:     Effort: Pulmonary effort is normal. No respiratory distress.  Abdominal:     General: There is no distension.      Palpations: Abdomen is soft.     Tenderness: There is abdominal tenderness (RLQ). There is no guarding.  Musculoskeletal:        General: No tenderness.     Cervical back: Normal range of motion.     Comments: Normal pulses bilateral LE, no erythema or swelling, no ulcerations  Skin:    General: Skin is warm and dry.     Findings: No erythema or rash.  Neurological:     Mental Status: She is alert and oriented to person, place, and time.     ED Results / Procedures / Treatments   Labs (all labs ordered are listed, but only abnormal results are displayed) Labs Reviewed  CBC WITH DIFFERENTIAL/PLATELET - Abnormal; Notable for the following components:      Result Value   Platelets 418 (*)    All other components within normal limits  COMPREHENSIVE METABOLIC PANEL - Abnormal; Notable for the following components:   Sodium 134 (*)    Chloride 93 (*)    Glucose, Bld 438 (*)    BUN 31 (*)    Creatinine, Ser 1.06 (*)    AST 7 (*)    Anion gap 19 (*)    All other components within normal limits  CBG MONITORING, ED - Abnormal; Notable for the following components:   Glucose-Capillary 387 (*)    All other components within normal limits  LIPASE, BLOOD  PREGNANCY, URINE  URINALYSIS, ROUTINE W REFLEX MICROSCOPIC    EKG None  Radiology No results found.  Procedures Procedures  {Document cardiac monitor, telemetry assessment procedure when appropriate:1}  Medications Ordered in ED Medications  ondansetron (ZOFRAN-ODT) disintegrating tablet 4 mg (4 mg Oral Given 05/07/22 1441)    ED Course/ Medical Decision Making/ A&P                           Medical Decision Making Amount and/or Complexity of Data Reviewed Labs: ordered. Radiology: ordered.  Risk Prescription drug management.   36 year old female with a history of type 1 diabetes, diabetic neuropathy, presents with concern for nausea and vomiting, abdominal pain, worsening neuropathy pain.   Regarding bilateral  lower extremity pain--she saw pain management 9/1, and this was noted as severe at time of admission in July.   She has normal pulses, no sign of acute arterial thrombus-suspect pain is related to her underlying neuropathy and chronic pain syndrome.  Also suspect pain worsened due to withdrawal, ran out of narcotic rx.   Regarding nausea/vomiting and abdominal pain: DDx includes appendicitis, pancreatitis, cholecystitis, pyelonephritis, nephrolithiasis, diverticulitis, DKA, SBO, PID, ovarian torsion, ectopic pregnancy, withdrawal from opiates and tuboovarian abscess   Labs completed and personally evaluated interpreted by me show a normal hemoglobin, no leukocytosis.  She has hyperglycemia with an anion gap of 19, and a normal bicarb.  Ordered  VBG and beta hydroxybutyric acid as well as lactic acid. Lactic acid normal (on recheck was elevated but on recheck again normal.)   PH and pCO2 normal.  Urinalysis with ketones, no sign of infection. Urine pregnancy test negative.  She has hyperglycemia and some ketosis without signs of DKA.    CT abdomen pelvis ordered and without acute abnormalities.  Given fluid, dilaudid, zofran, bentyl for pain and nausea/vomiting.  Given insulin for hyperglycemia and fluid with improvement to 291.  Given additional fluid, haldol, with some improvement but continued symptoms.  Again gave toradol, dilaudid, phenergan.  She reports feeling improved, however when given po challenge she is unable to tolerate po.  Will admit for intractable nausea, vomiting in the setting of DM, ketosis.       {Document critical care time when appropriate:1} {Document review of labs and clinical decision tools ie heart score, Chads2Vasc2 etc:1}  {Document your independent review of radiology images, and any outside records:1} {Document your discussion with family members, caretakers, and with consultants:1} {Document social determinants of health affecting pt's care:1} {Document your  decision making why or why not admission, treatments were needed:1} Final Clinical Impression(s) / ED Diagnoses Final diagnoses:  None    Rx / DC Orders ED Discharge Orders     None

## 2022-05-07 NOTE — ED Triage Notes (Addendum)
Pt from home, reports several episodes of emesis over the past few days in addition to abdominal pain and pain in both feet. Pt alert and oriented. Pt states that she can walk but it hurts to walk on her feet at this time.

## 2022-05-07 NOTE — ED Notes (Signed)
Patient stated 10/10 pain when given Dilaudid. "My  main concern is my feet, but yeah my abdomen hurts too".

## 2022-05-07 NOTE — ED Provider Notes (Signed)
[facility] EMERGENCY DEPT Provider Note   CSN: [id number] Arrival date & time: 05/07/22  1344     History {Add pertinent medical, surgical, social history, OB history to HPI:1} Chief Complaint  Patient presents with   Nausea    Dandra L Ambrielle Kington is a 36 y.o. female.  HPI     36 year old female with a history of type 1 diabetes, diabetic neuropathy, presents with concern for nausea and vomiting, abdominal pain, worsening neuropathy pain.  N/V more than 5 times day for the last week, has not been able to keep anything down. Glucose running in 300s, no change in insulin Pain bilateral lower extremities neuropathy has been worse. Difficult sleeping Abdominal pain center of abdomen, "just sitting there", constant pain, dull Is passing flatus, last had BM, no diarrhea No urinary symptoms No fevers No vaginal bleeding or discharge No change in other medications No alcohol/cigarettes/other drugs No known sick contacts No cough, dyspnea, chest pain Headache Generally weak, fatigue   Past Medical History:  Diagnosis Date   Chronic constipation 11/08/2021   Diabetic neuropathy ([place])    History of chicken pox    Type 1 diabetes ([place])    Vaginal odor 11/08/2021     Home Medications Prior to Admission medications   Medication Sig Start Date End Date Taking? Authorizing Provider  Continuous Blood Gluc Transmit (DEXCOM G6 TRANSMITTER) MISC Inject 1 each into the skin every 14 (fourteen) days. 12/31/21   Jeanie Sewer, NP  cyanocobalamin (,VITAMIN B-12,) 1000 MCG/ML injection Inject into the muscle. monthly 07/16/21   [provider]  gabapentin (NEURONTIN) 400 MG capsule Take 1 capsule (400 mg total) by mouth 3 (three) times daily. 03/10/22   Kathie Dike, MD  insulin aspart (NOVOLOG FLEXPEN) 100 UNIT/ML FlexPen Inject 15-20 Units into the skin 3 (three) times daily with meals. plus sliding scale 08/10/21 03/09/22  Jeanie Sewer, NP   insulin detemir (LEVEMIR FLEXTOUCH) 100 UNIT/ML FlexPen 50 UNITS AT BEDTIME, 40 UNITS EACH AM *D/C LEVEMIR VIALS Patient taking differently: 600 UNITS AT BEDTIME, 40 UNITS EACH AM *D/C LEVEMIR VIALS 01/01/22   Hudnell, Colletta [place], NP  Insulin Pen Needle (B-D UF III MINI PEN NEEDLES) 31G X 5 MM MISC To use with Insulin pens, as directed 11/20/21   Jeanie Sewer, NP  Insulin Syringes, Disposable, U-100 1 ML MISC Inject 1 each into the skin in the morning and at bedtime. 08/10/21   Jeanie Sewer, NP  ondansetron (ZOFRAN) 4 MG tablet Take 1 tablet (4 mg total) by mouth every 8 (eight) hours as needed for nausea or vomiting. 03/10/22   Kathie Dike, MD  Oxycodone HCl 10 MG TABS Take 10 mg by mouth 3 (three) times daily as needed. 03/16/22   [provider]  phentermine (ADIPEX-P) 37.5 MG tablet Take 37.5 mg by mouth daily. 01/15/22   [provider]  traZODone (DESYREL) 100 MG tablet Take 100 mg by mouth at bedtime. 03/18/22   [provider]      Allergies    Pramipexole    Review of Systems   Review of Systems  Physical Exam Updated Vital Signs BP 109/89 (BP Location: Right Arm)   Pulse 96   Temp 98.2 F (36.8 C)   Resp 16   Ht '5\' 6"'$  (1.676 m)   Wt 74.8 kg   LMP 04/08/2022 (Approximate)   SpO2 100%   BMI 26.63 kg/m  Physical Exam Vitals and nursing note reviewed.  Constitutional:      General: She is  not in acute distress.    Appearance: She is well-developed. She is ill-appearing. She is not diaphoretic.  HENT:     Head: Normocephalic and atraumatic.  Eyes:     Conjunctiva/sclera: Conjunctivae normal.  Cardiovascular:     Rate and Rhythm: Normal rate and regular rhythm.     Pulses: Normal pulses.  Pulmonary:     Effort: Pulmonary effort is normal. No respiratory distress.  Abdominal:     General: There is no distension.     Palpations: Abdomen is soft.     Tenderness: There is abdominal tenderness (RLQ). There is no guarding.   Musculoskeletal:        General: No tenderness.     Cervical back: Normal range of motion.     Comments: Normal pulses bilateral LE, no erythema or swelling, no ulcerations  Skin:    General: Skin is warm and dry.     Findings: No erythema or rash.  Neurological:     Mental Status: She is alert and oriented to person, place, and time.     ED Results / Procedures / Treatments   Labs (all labs ordered are listed, but only abnormal results are displayed) Labs Reviewed  CBC WITH DIFFERENTIAL/PLATELET - Abnormal; Notable for the following components:      Result Value   Platelets 418 (*)    All other components within normal limits  COMPREHENSIVE METABOLIC PANEL - Abnormal; Notable for the following components:   Sodium 134 (*)    Chloride 93 (*)    Glucose, Bld 438 (*)    BUN 31 (*)    Creatinine, Ser 1.06 (*)    AST 7 (*)    Anion gap 19 (*)    All other components within normal limits  CBG MONITORING, ED - Abnormal; Notable for the following components:   Glucose-Capillary 387 (*)    All other components within normal limits  LIPASE, BLOOD  PREGNANCY, URINE  URINALYSIS, ROUTINE W REFLEX MICROSCOPIC    EKG None  Radiology No results found.  Procedures Procedures  {Document cardiac monitor, telemetry assessment procedure when appropriate:1}  Medications Ordered in ED Medications  ondansetron (ZOFRAN-ODT) disintegrating tablet 4 mg (4 mg Oral Given 05/07/22 1441)    ED Course/ Medical Decision Making/ A&P                           Medical Decision Making Amount and/or Complexity of Data Reviewed Labs: ordered. Radiology: ordered.  Risk Prescription drug management.   36 year old female with a history of type 1 diabetes, diabetic neuropathy, presents with concern for nausea and vomiting, abdominal pain, worsening neuropathy pain.   Regarding bilateral lower extremity pain--she saw pain management 9/1, and this was noted as severe at time of admission in  July.   She has normal pulses, no sign of acute arterial thrombus-suspect pain is related to her underlying neuropathy and chronic pain syndrome.  Regarding nausea/vomiting and abdominal pain: DDx includes appendicitis, pancreatitis, cholecystitis, pyelonephritis, nephrolithiasis, diverticulitis, DKA, SBO, PID, ovarian torsion, ectopic pregnancy, and tuboovarian abscess   Labs completed and personally evaluated interpreted by me show a normal hemoglobin, no leukocytosis.  She has hyperglycemia with an anion gap of 19, and a normal bicarb.  Ordered VBG and beta hydroxybutyric acid as well as lactic acid. Lactic acid normal. PH and pCO2 normal.  She has hyperglycemia without signs of DKA.  Urinalysis *** Urine pregnancy test ***  CT abdomen pelvis ordered and ***  Given fluid, dilaudid, zofran, bentyl for pain and nausea/vomiting.    {Document critical care time when appropriate:1} {Document review of labs and clinical decision tools ie heart score, Chads2Vasc2 etc:1}  {Document your independent review of radiology images, and any outside records:1} {Document your discussion with family members, caretakers, and with consultants:1} {Document social determinants of health affecting pt's care:1} {Document your decision making why or why not admission, treatments were needed:1} Final Clinical Impression(s) / ED Diagnoses Final diagnoses:  None    Rx / DC Orders ED Discharge Orders     None

## 2022-05-08 DIAGNOSIS — R112 Nausea with vomiting, unspecified: Principal | ICD-10-CM | POA: Diagnosis present

## 2022-05-08 DIAGNOSIS — E1065 Type 1 diabetes mellitus with hyperglycemia: Secondary | ICD-10-CM | POA: Diagnosis not present

## 2022-05-08 DIAGNOSIS — R1084 Generalized abdominal pain: Secondary | ICD-10-CM | POA: Diagnosis not present

## 2022-05-08 DIAGNOSIS — E8889 Other specified metabolic disorders: Secondary | ICD-10-CM | POA: Diagnosis not present

## 2022-05-08 DIAGNOSIS — Z79899 Other long term (current) drug therapy: Secondary | ICD-10-CM | POA: Diagnosis not present

## 2022-05-08 DIAGNOSIS — T402X1A Poisoning by other opioids, accidental (unintentional), initial encounter: Secondary | ICD-10-CM | POA: Diagnosis not present

## 2022-05-08 DIAGNOSIS — G894 Chronic pain syndrome: Secondary | ICD-10-CM

## 2022-05-08 DIAGNOSIS — Z794 Long term (current) use of insulin: Secondary | ICD-10-CM | POA: Diagnosis not present

## 2022-05-08 DIAGNOSIS — F1990 Other psychoactive substance use, unspecified, uncomplicated: Secondary | ICD-10-CM

## 2022-05-08 DIAGNOSIS — E1040 Type 1 diabetes mellitus with diabetic neuropathy, unspecified: Secondary | ICD-10-CM | POA: Diagnosis not present

## 2022-05-08 DIAGNOSIS — E876 Hypokalemia: Secondary | ICD-10-CM | POA: Diagnosis not present

## 2022-05-08 DIAGNOSIS — E860 Dehydration: Secondary | ICD-10-CM | POA: Diagnosis not present

## 2022-05-08 DIAGNOSIS — E1010 Type 1 diabetes mellitus with ketoacidosis without coma: Secondary | ICD-10-CM | POA: Diagnosis not present

## 2022-05-08 LAB — GLUCOSE, CAPILLARY
Glucose-Capillary: 328 mg/dL — ABNORMAL HIGH (ref 70–99)
Glucose-Capillary: 273 mg/dL — ABNORMAL HIGH (ref 70–99)
Glucose-Capillary: 192 mg/dL — ABNORMAL HIGH (ref 70–99)
Glucose-Capillary: 153 mg/dL — ABNORMAL HIGH (ref 70–99)
Glucose-Capillary: 153 mg/dL — ABNORMAL HIGH (ref 70–99)
Glucose-Capillary: 154 mg/dL — ABNORMAL HIGH (ref 70–99)
Glucose-Capillary: 161 mg/dL — ABNORMAL HIGH (ref 70–99)
Glucose-Capillary: 150 mg/dL — ABNORMAL HIGH (ref 70–99)
Glucose-Capillary: 131 mg/dL — ABNORMAL HIGH (ref 70–99)
Glucose-Capillary: 152 mg/dL — ABNORMAL HIGH (ref 70–99)
Glucose-Capillary: 181 mg/dL — ABNORMAL HIGH (ref 70–99)
Glucose-Capillary: 169 mg/dL — ABNORMAL HIGH (ref 70–99)
Glucose-Capillary: 136 mg/dL — ABNORMAL HIGH (ref 70–99)
Glucose-Capillary: 116 mg/dL — ABNORMAL HIGH (ref 70–99)

## 2022-05-08 LAB — BASIC METABOLIC PANEL
Anion gap: 11 (ref 5–15)
Anion gap: 9 (ref 5–15)
Anion gap: 8 (ref 5–15)
Anion gap: 4 — ABNORMAL LOW (ref 5–15)
BUN: 25 mg/dL — ABNORMAL HIGH (ref 6–20)
BUN: 25 mg/dL — ABNORMAL HIGH (ref 6–20)
BUN: 24 mg/dL — ABNORMAL HIGH (ref 6–20)
BUN: 23 mg/dL — ABNORMAL HIGH (ref 6–20)
CO2: 21 mmol/L — ABNORMAL LOW (ref 22–32)
CO2: 22 mmol/L (ref 22–32)
CO2: 24 mmol/L (ref 22–32)
CO2: 26 mmol/L (ref 22–32)
Calcium: 9.1 mg/dL (ref 8.9–10.3)
Calcium: 9 mg/dL (ref 8.9–10.3)
Calcium: 8.9 mg/dL (ref 8.9–10.3)
Calcium: 9 mg/dL (ref 8.9–10.3)
Chloride: 101 mmol/L (ref 98–111)
Chloride: 106 mmol/L (ref 98–111)
Chloride: 107 mmol/L (ref 98–111)
Chloride: 108 mmol/L (ref 98–111)
Creatinine, Ser: 0.91 mg/dL (ref 0.44–1.00)
Creatinine, Ser: 0.88 mg/dL (ref 0.44–1.00)
Creatinine, Ser: 0.78 mg/dL (ref 0.44–1.00)
Creatinine, Ser: 0.8 mg/dL (ref 0.44–1.00)
GFR, Estimated: >60 mL/min (ref >60)
GFR, Estimated: >60 mL/min (ref >60)
GFR, Estimated: >60 mL/min (ref >60)
GFR, Estimated: >60 mL/min (ref >60)
Glucose, Bld: 335 mg/dL — ABNORMAL HIGH (ref 70–99)
Glucose, Bld: 294 mg/dL — ABNORMAL HIGH (ref 70–99)
Glucose, Bld: 141 mg/dL — ABNORMAL HIGH (ref 70–99)
Glucose, Bld: 148 mg/dL — ABNORMAL HIGH (ref 70–99)
Potassium: 4.8 mmol/L (ref 3.5–5.1)
Potassium: 2.9 mmol/L — ABNORMAL LOW (ref 3.5–5.1)
Potassium: 2.9 mmol/L — ABNORMAL LOW (ref 3.5–5.1)
Potassium: 3.4 mmol/L — ABNORMAL LOW (ref 3.5–5.1)
Sodium: 133 mmol/L — ABNORMAL LOW (ref 135–145)
Sodium: 137 mmol/L (ref 135–145)
Sodium: 139 mmol/L (ref 135–145)
Sodium: 138 mmol/L (ref 135–145)

## 2022-05-08 LAB — RAPID URINE DRUG SCREEN, HOSP PERFORMED
Amphetamines: NOT DETECTED
Barbiturates: NOT DETECTED
Benzodiazepines: NOT DETECTED
Cocaine: NOT DETECTED
Opiates: POSITIVE — AB
Tetrahydrocannabinol: NOT DETECTED

## 2022-05-08 LAB — MAGNESIUM: Magnesium: 2.2 mg/dL (ref 1.7–2.4)

## 2022-05-08 LAB — BETA-HYDROXYBUTYRIC ACID
Beta-Hydroxybutyric Acid: 1.25 mmol/L — ABNORMAL HIGH (ref 0.05–0.27)
Beta-Hydroxybutyric Acid: 0.18 mmol/L (ref 0.05–0.27)
Beta-Hydroxybutyric Acid: 0.73 mmol/L — ABNORMAL HIGH (ref 0.05–0.27)

## 2022-05-08 LAB — MRSA NEXT GEN BY PCR, NASAL: MRSA by PCR Next Gen: NOT DETECTED

## 2022-05-08 MED ORDER — DEXTROSE IN LACTATED RINGERS 5 % IV SOLN: INTRAVENOUS | Status: DC

## 2022-05-08 MED ORDER — GABAPENTIN 400 MG PO CAPS
400.0000 mg | ORAL_CAPSULE | Freq: Three times a day (TID) | ORAL | Status: DC
  Administered 2022-05-08 – 2022-05-10 (×7): 400 mg via ORAL
  Filled 2022-05-08 (×7): qty 1

## 2022-05-08 MED ORDER — INSULIN ASPART 100 UNIT/ML IJ SOLN: 0.0000 [IU] | Freq: Every day | INTRAMUSCULAR | Status: DC

## 2022-05-08 MED ORDER — INSULIN DETEMIR 100 UNIT/ML ~~LOC~~ SOLN: 30.0000 [IU] | Freq: Two times a day (BID) | SUBCUTANEOUS | Status: DC

## 2022-05-08 MED ORDER — ORAL CARE MOUTH RINSE: 15.0000 mL | OROMUCOSAL | Status: DC | PRN

## 2022-05-08 MED ORDER — ENOXAPARIN SODIUM 40 MG/0.4ML IJ SOSY
40.0000 mg | PREFILLED_SYRINGE | INTRAMUSCULAR | Status: DC
  Administered 2022-05-08 – 2022-05-10 (×3): 40 mg via SUBCUTANEOUS
  Filled 2022-05-08 (×3): qty 0.4

## 2022-05-08 MED ORDER — POTASSIUM CHLORIDE 10 MEQ/100ML IV SOLN
10.0000 meq | INTRAVENOUS | Status: AC
  Administered 2022-05-08 (×6): 10 meq via INTRAVENOUS
  Filled 2022-05-08 (×6): qty 100

## 2022-05-08 MED ORDER — CHLORHEXIDINE GLUCONATE CLOTH 2 % EX PADS
6.0000 | MEDICATED_PAD | Freq: Every day | CUTANEOUS | Status: DC
  Administered 2022-05-08: 6 via TOPICAL

## 2022-05-08 MED ORDER — ONDANSETRON HCL 4 MG/2ML IJ SOLN: 4.0000 mg | Freq: Four times a day (QID) | INTRAMUSCULAR | Status: DC | PRN

## 2022-05-08 MED ORDER — LACTATED RINGERS IV SOLN: INTRAVENOUS | Status: DC

## 2022-05-08 MED ORDER — INSULIN REGULAR(HUMAN) IN NACL 100-0.9 UT/100ML-% IV SOLN
INTRAVENOUS | Status: DC
  Administered 2022-05-08: 11 [IU]/h via INTRAVENOUS
  Filled 2022-05-08: qty 100

## 2022-05-08 MED ORDER — METOCLOPRAMIDE HCL 5 MG/ML IJ SOLN
5.0000 mg | Freq: Three times a day (TID) | INTRAMUSCULAR | Status: DC
  Administered 2022-05-08 – 2022-05-10 (×6): 5 mg via INTRAVENOUS
  Filled 2022-05-08 (×6): qty 2

## 2022-05-08 MED ORDER — KETOROLAC TROMETHAMINE 15 MG/ML IJ SOLN
15.0000 mg | Freq: Four times a day (QID) | INTRAMUSCULAR | Status: DC | PRN
  Administered 2022-05-09 – 2022-05-10 (×3): 15 mg via INTRAVENOUS
  Filled 2022-05-08 (×3): qty 1

## 2022-05-08 MED ORDER — DEXTROSE 50 % IV SOLN: 0.0000 mL | INTRAVENOUS | Status: DC | PRN

## 2022-05-08 MED ORDER — HYDROMORPHONE HCL 1 MG/ML IJ SOLN
0.5000 mg | Freq: Once | INTRAMUSCULAR | Status: AC
  Administered 2022-05-08: 0.5 mg via INTRAVENOUS
  Filled 2022-05-08: qty 1

## 2022-05-08 MED ORDER — INSULIN ASPART 100 UNIT/ML IJ SOLN: 0.0000 [IU] | INTRAMUSCULAR | Status: DC

## 2022-05-08 MED ORDER — SODIUM CHLORIDE 0.9 % IV SOLN: 25.0000 mg | Freq: Four times a day (QID) | INTRAVENOUS | Status: DC | PRN

## 2022-05-08 MED ORDER — INSULIN DETEMIR 100 UNIT/ML ~~LOC~~ SOLN
25.0000 [IU] | Freq: Two times a day (BID) | SUBCUTANEOUS | Status: DC
  Administered 2022-05-08 – 2022-05-09 (×3): 25 [IU] via SUBCUTANEOUS
  Filled 2022-05-08 (×4): qty 0.25

## 2022-05-08 MED ORDER — INSULIN ASPART 100 UNIT/ML IJ SOLN
0.0000 [IU] | Freq: Three times a day (TID) | INTRAMUSCULAR | Status: DC
  Administered 2022-05-08: 1 [IU] via SUBCUTANEOUS
  Administered 2022-05-09 (×2): 2 [IU] via SUBCUTANEOUS
  Administered 2022-05-09: 1 [IU] via SUBCUTANEOUS

## 2022-05-08 MED ORDER — INSULIN REGULAR(HUMAN) IN NACL 100-0.9 UT/100ML-% IV SOLN
INTRAVENOUS | Status: DC
  Administered 2022-05-08: 2.4 [IU]/h via INTRAVENOUS

## 2022-05-08 NOTE — ED Notes (Signed)
[facility] arrived to transport pt. Pt stable at time of departure. Report given

## 2022-05-08 NOTE — TOC Initial Note (Signed)
Transition of Care [facility]) - Initial/Assessment Note    Patient Details  Name: [name] MRN: [id number] Date of Birth: 09/13/1985  Transition of Care [facility]) CM/SW Contact:    Henrietta Dine, RN Phone Number: 05/08/2022, 5:17 PM  Clinical Narrative:  pt from home with spouse and children; plans to d/chome; POC Arlana Hove, (spouse) [phone]; the pt says verified her insurance and says she has a PCP; pt also says she has transportation home and to her appts.  Transition of Care [facility]) Screening Note   Patient Details  Name: [name] Date of Birth: Apr 23, 1986   Transition of Care [facility]) CM/SW Contact:    Henrietta Dine, RN Phone Number: 05/08/2022, 5:19 PM    Transition of Care Department [facility]) has reviewed patient and no TOC needs have been identified at this time. We will continue to monitor patient advancement through interdisciplinary progression rounds. If new patient transition needs arise, please place a TOC consult.    Expected Discharge Plan: Home/Self Care Barriers to Discharge: Continued Medical Work up   Patient Goals and CMS Choice Patient states their goals for this hospitalization and ongoing recovery are:: home      Expected Discharge Plan and Services Expected Discharge Plan: Home/Self Care   Discharge Planning Services: CM Consult   Living arrangements for the past 2 months: Single Family Home                                      Prior Living Arrangements/Services Living arrangements for the past 2 months: Single Family Home Lives with:: Spouse Patient language and need for interpreter reviewed:: Yes Do you feel safe going back to the place where you live?: Yes      Need for Family Participation in Patient Care: Yes (Comment) Care giver support system in place?: Yes (comment)   Criminal Activity/Legal Involvement Pertinent to Current Situation/Hospitalization: No - Comment as  needed  Activities of Daily Living Home Assistive Devices/Equipment: None ADL Screening (condition at time of admission) Patient's cognitive ability adequate to safely complete daily activities?: Yes Is the patient deaf or have difficulty hearing?: No Does the patient have difficulty seeing, even when wearing glasses/contacts?: No Does the patient have difficulty concentrating, remembering, or making decisions?: No Patient able to express need for assistance with ADLs?: No Does the patient have difficulty dressing or bathing?: No Independently performs ADLs?: Yes (appropriate for developmental age) Does the patient have difficulty walking or climbing stairs?: No Weakness of Legs: Both Weakness of Arms/Hands: None  Permission Sought/Granted Permission sought to share information with : Case Manager Permission granted to share information with : Yes, Verbal Permission Granted  Share Information with NAME: Lenor Coffin, RN, CM           Emotional Assessment Appearance:: Appears stated age Attitude/Demeanor/Rapport: Gracious Affect (typically observed): Accepting Orientation: : Oriented to Self, Oriented to Place, Oriented to  Time, Oriented to Situation Alcohol / Substance Use: Not Applicable Psych Involvement: No (comment)  Admission diagnosis:  Dehydration [E86.0] Ketosis ([place]) [E88.89] Generalized abdominal pain [R10.84] Nausea & vomiting [R11.2] Nausea and vomiting, unspecified vomiting type [R11.2] Patient Active Problem List   Diagnosis Date Noted   Nausea & vomiting 05/08/2022   Misuse of medication 05/08/2022   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   AKI (acute kidney injury) ([place]) 03/09/2022   UTI (urinary tract infection) 03/09/2022  Chronic pain syndrome 03/09/2022   Vulvar dysplasia    Type 1 diabetes mellitus with hyperlipidemia ([place]) 12/02/2021   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   Gastroesophageal reflux disease with esophagitis  without hemorrhage 11/28/2021   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 09/10/2021   Drug induced constipation 08/10/2021   Vaginal yeast infection 08/10/2021   Abdominal pain 02/15/2021   Nausea and vomiting 02/15/2021   Diabetic polyneuropathy associated with type 1 diabetes mellitus ([place]) 03/14/2019   Non compliance with medical treatment 03/14/2019   DKA, type 1 ([place]) 07/21/2018   Uncontrolled type 1 diabetes mellitus with hyperglycemia ([place]) 07/03/2011    Class: Acute   PCP:  Jeanie Sewer, NP Pharmacy:   [id number] [place], [place] [id number]: [phone]: [phone] [place] [address] [place] [id number]: [phone]: [id number]: [phone]: [phone]    Social Determinants of Health (SDOH) Interventions    Readmission Risk Interventions     No data to display

## 2022-05-08 NOTE — Assessment & Plan Note (Addendum)
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 Or canabis hyperemesis syndrome?  H/o THC use in past it looks like based on prior UDS. Or possibly DKA: BHB 2.87, 80 keytones in urine and initial AG 19 sure looks suspicious for DKA in a DM1 patient 1. Check UDS here 2. NPO 3. IVF 4. Phenergan PRN for the moment 5. Will put on insulin gtt per DKA pathway for the moment 6. IVF bolus done in ED.

## 2022-05-08 NOTE — H&P (Signed)
History and Physical    Patient: [name] NID:[id number] DOB: April 21, 1986 DOA: 05/07/2022 DOS: the patient was seen and examined on 05/08/2022 PCP: Jeanie Sewer, NP  Patient coming from: Home  Chief Complaint:  Chief Complaint  Patient presents with   Nausea   HPI: [name] is a 36 y.o. female with medical history significant of DM1, CPS, diabetic neuropathy.  Pt presents to ED with c/o N/V, abd pain, neuropathy pain.  Primarily neuropathy pain is worse today.  Pt ran out of hydrocodone because she was taking an extra pill a night and ran out early.  Very concerning is the fact that this is the second time this has happened in 2 months (last in July).  The ED doc gave IV dilaudid.  Which has worked for pain, pt now resting comfortably.  Lab work initially concerning for possible early DKA with AG 19 and BHB of 2.87, 80 keytones in urine.   Review of Systems: As mentioned in the history of present illness. All other systems reviewed and are negative. Past Medical History:  Diagnosis Date   Chronic constipation 11/08/2021   Diabetic neuropathy ([place])    History of chicken pox    Type 1 diabetes ([place])    Vaginal odor 11/08/2021   Past Surgical History:  Procedure Laterality Date   CESAREAN SECTION     only one C/S per pt.   CO2 LASER APPLICATION N/A 08/22/4313   Procedure: CO2 LASER APPLICATION TO VULVA;  Surgeon: Lafonda Mosses, MD;  Location: [place];  Service: General;  Laterality: N/A;   DILATION AND CURETTAGE OF UTERUS     EXCISION OF SKIN TAG N/A 02/22/2022   Procedure: EXCISION OF ANAL SKIN TAGS;  Surgeon: Leighton Ruff, MD;  Location: [place];  Service: General;  Laterality: N/A;   Social History:  reports that she has never smoked. She has never been exposed to tobacco smoke. She has never used smokeless tobacco. She reports that she does not drink alcohol and does not use drugs.  Allergies   Allergen Reactions   Pramipexole Shortness Of Breath    Family History  Problem Relation Age of Onset   Hypertension Mother    Glaucoma Mother    Diabetes Maternal Grandmother    Neuropathy Neg Hx    Colon cancer Neg Hx    Stomach cancer Neg Hx    Esophageal cancer Neg Hx    Colon polyps Neg Hx    Rectal cancer Neg Hx     Prior to Admission medications   Medication Sig Start Date End Date Taking? Authorizing Provider  Continuous Blood Gluc Transmit (DEXCOM G6 TRANSMITTER) MISC Inject 1 each into the skin every 14 (fourteen) days. 12/31/21  Yes Hudnell, Colletta [place], NP  cyanocobalamin (,VITAMIN B-12,) 1000 MCG/ML injection Inject into the muscle. monthly 07/16/21  Yes [provider]  insulin aspart (NOVOLOG FLEXPEN) 100 UNIT/ML FlexPen Inject 15-20 Units into the skin 3 (three) times daily with meals. plus sliding scale 08/10/21 05/07/22 Yes Hudnell, Colletta [place], NP  insulin detemir (LEVEMIR FLEXTOUCH) 100 UNIT/ML FlexPen 50 UNITS AT BEDTIME, 40 UNITS EACH AM *D/C LEVEMIR VIALS Patient taking differently: 600 UNITS AT BEDTIME, 40 UNITS EACH AM *D/C LEVEMIR VIALS 01/01/22  Yes Hudnell, Colletta [place], NP  traZODone (DESYREL) 100 MG tablet Take 100 mg by mouth at bedtime. 03/18/22  Yes [provider]  gabapentin (NEURONTIN) 400 MG capsule Take 1 capsule (400 mg total) by mouth 3 (three) times  daily. Patient not taking: Reported on 05/07/2022 03/10/22   Kathie Dike, MD  Insulin Pen Needle (B-D UF III MINI PEN NEEDLES) 31G X 5 MM MISC To use with Insulin pens, as directed 11/20/21   Jeanie Sewer, NP  Insulin Syringes, Disposable, U-100 1 ML MISC Inject 1 each into the skin in the morning and at bedtime. 08/10/21   Jeanie Sewer, NP  ondansetron (ZOFRAN) 4 MG tablet Take 1 tablet (4 mg total) by mouth every 8 (eight) hours as needed for nausea or vomiting. 03/10/22   Kathie Dike, MD    Physical Exam: Vitals:   05/08/22 0147 05/08/22 0148 05/08/22 0200 05/08/22 0201   BP: (!) 150/89  132/85   Pulse: (!) 110 (!) 109 (!) 108 (!) 107  Resp: (!) '24 15 12 13  '$ Temp:      TempSrc:      SpO2: 100% 100% 99% 98%  Weight:      Height:       Constitutional: NAD, calm, comfortable Eyes: PERRL, lids and conjunctivae normal ENMT: Mucous membranes are moist. Posterior pharynx clear of any exudate or lesions.Normal dentition.  Neck: normal, supple, no masses, no thyromegaly Respiratory: clear to auscultation bilaterally, no wheezing, no crackles. Normal respiratory effort. No accessory muscle use.  Cardiovascular: Regular rate and rhythm, no murmurs / rubs / gallops. No extremity edema. 2+ pedal pulses. No carotid bruits.  Abdomen: no tenderness, no masses palpated. No hepatosplenomegaly. Bowel sounds positive.  Musculoskeletal: no clubbing / cyanosis. No joint deformity upper and lower extremities. Good ROM, no contractures. Normal muscle tone.  Skin: no rashes, lesions, ulcers. No induration Neurologic: CN 2-12 grossly intact. Sensation intact, DTR normal. Strength 5/5 in all 4.  Psychiatric: Normal judgment and insight. Alert and oriented x 3. Normal mood.   Data Reviewed:       Latest Ref Rng & Units 05/07/2022    3:54 PM 05/07/2022    2:35 PM 03/10/2022    4:38 AM  CBC  WBC 4.0 - 10.5 K/uL  8.6  7.0   Hemoglobin 12.0 - 15.0 g/dL 13.3  12.8  11.3   Hematocrit 36.0 - 46.0 % 39.0  36.6  32.6   Platelets 150 - 400 K/uL  418  329       Latest Ref Rng & Units 05/08/2022   12:31 AM 05/07/2022    3:54 PM 05/07/2022    2:35 PM  CMP  Glucose 70 - 99 mg/dL 335   438   BUN 6 - 20 mg/dL 25   31   Creatinine 0.44 - 1.00 mg/dL 0.91   1.06   Sodium 135 - 145 mmol/L 133  131  134   Potassium 3.5 - 5.1 mmol/L 4.8  3.6  3.7   Chloride 98 - 111 mmol/L 101   93   CO2 22 - 32 mmol/L 21   22   Calcium 8.9 - 10.3 mg/dL 9.1   10.1   Total Protein 6.5 - 8.1 g/dL   8.0   Total Bilirubin 0.3 - 1.2 mg/dL   0.4   Alkaline Phos 38 - 126 U/L   68   AST 15 - 41 U/L   7   ALT  0 - 44 U/L   7    Urinalysis    Component Value Date/Time   COLORURINE ORANGE (A) 05/07/2022 2036   APPEARANCEUR CLEAR 05/07/2022 2036   LABSPEC >1.046 (H) 05/07/2022 2036   PHURINE 7.0 05/07/2022 2036   GLUCOSEU >1,000 (A)  05/07/2022 2036   HGBUR LARGE (A) 05/07/2022 2036   BILIRUBINUR NEGATIVE 05/07/2022 2036   BILIRUBINUR negative 11/08/2021 1424   KETONESUR >80 (A) 05/07/2022 2036   PROTEINUR 30 (A) 05/07/2022 2036   UROBILINOGEN 0.2 11/08/2021 1424   UROBILINOGEN 0.2 05/03/2021 0837   NITRITE NEGATIVE 05/07/2022 2036   LEUKOCYTESUR NEGATIVE 05/07/2022 2036      Assessment and Plan: * Nausea & vomiting Diabetic gastroparesis worsened by overuse of opiates? Or canabis hyperemesis syndrome?  H/o THC use in past it looks like based on prior UDS. Or possibly DKA: BHB 2.87, 80 keytones in urine and initial AG 19 sure looks suspicious for DKA in a DM1 patient Check UDS here NPO IVF Phenergan PRN for the moment Will put on insulin gtt per DKA pathway for the moment IVF bolus done in ED.  Misuse of medication Pt continues to, by her own admission, overuse prescribed opiates and run out of meds early, h/o same earlier this summer documented during admission in July. Unfortunately I will have to defer any further opiates to her pain clinic, and Im not planning on ordering opiates for pain control here as a result. EDP already gave dilaudid, and pt already sedated / sleepy from the high dose opiates given in ED at this time, and I can therefore defer the final decision on IP narcotics or not and discussion about overuse / risks of overuse to the day doc at 0700. Use clonidine if needed for withdrawal symptoms (though I doubt she will have any before 0700). Clinical opiate withdrawal score  Uncontrolled type 1 diabetes mellitus with hyperglycemia (HCC) Insulin gtt as mentioned above for suspected early / mild DKA. 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  Chronic pain syndrome See opiate abuse above Going to have to defer further opiates to pain clinic / day doc. Use clonidine for withdrawal symptoms if needed.      Advance Care Planning:   Code Status: Full Code  Consults: None  Family Communication: No family in room  Severity of Illness: The appropriate patient status for this patient is OBSERVATION. Observation status is judged to be reasonable and necessary in order to provide the required intensity of service to ensure the patient's safety. The patient's presenting symptoms, physical exam findings, and initial radiographic and laboratory data in the context of their medical condition is felt to place them at decreased risk for further clinical deterioration. Furthermore, it is anticipated that the patient will be medically stable for discharge from the hospital within 2 midnights of admission.   Author: Etta Quill., DO 05/08/2022 2:03 AM  For on call review [email or web address].

## 2022-05-08 NOTE — Assessment & Plan Note (Addendum)
1. Insulin gtt as mentioned above for suspected early / mild DKA. 2. Pt put on pathway 3. Holding home insulins 4. Did warn RN that it looks like she takes higher doses than typical in a DM1 patient 1. 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

## 2022-05-08 NOTE — Assessment & Plan Note (Addendum)
See opiate abuse above Going to have to defer further opiates to pain clinic / day doc. Use clonidine for withdrawal symptoms if needed.

## 2022-05-08 NOTE — Progress Notes (Signed)
Patient admitted after midnight, please see H&P from Dr. Alcario Drought.  Appears her gap is closed and she can be converted back to her home dose.  Will avoid narcotics.  Patient has been taking more than prescribed of her oxycodone-- as per the visit in July where she was doing the same thing, she will needs to discuss with her pain MD/neurologist to change her dosing. Eulogio Bear DO

## 2022-05-08 NOTE — Inpatient Diabetes Management (Signed)
Inpatient Diabetes Program Recommendations  AACE/ADA: New Consensus Statement on Inpatient Glycemic Control (2015)  Target Ranges:  Prepandial:   less than 140 mg/dL      Peak postprandial:   less than 180 mg/dL (1-2 hours)      Critically ill patients:  140 - 180 mg/dL   Lab Results  Component Value Date   GLUCAP 131 (H) 05/08/2022   HGBA1C 7.7 (H) 03/09/2022    Review of Glycemic Control  Diabetes history: DM1 Outpatient Diabetes medications: Levemir 40 units in am and 50 units QHS, Novolog 15-20 TID Current orders for Inpatient glycemic control: IV insulin per [employer] for DKA  HgbA1C - 7.7%  Inpatient Diabetes Program Recommendations:    Levemir 25-30 units BID Novolog 0-9 units TID with meals and 0-5 HS Novolog 6 units TID with meals if eating > 50%  Spoke with pt at bedside regarding her Type 1 DM and blood sugar control at home. Pt states she has been taking Levemir 40 in am and 50 QHS for "a long time." Admits to hypoglycemia at times, and occasionally does not take her HS dose of Levemir. Pt not talkative, mumbling she doesn't feel good, and states she did not miss any insulin.   Will speak with her tomorrow am again.  Follow closely.  Thank you. Lorenda Peck, RD, LDN, [place] Inpatient Diabetes Coordinator [phone]

## 2022-05-08 NOTE — Assessment & Plan Note (Addendum)
Pt continues to, by her own admission, overuse prescribed opiates and run out of meds early, h/o same earlier this summer documented during admission in July. 1. Unfortunately I will have to defer any further opiates to her pain clinic, and Im not planning on ordering opiates for pain control here as a result. 2. EDP already gave dilaudid, and pt already sedated / sleepy from the high dose opiates given in ED at this time, and I can therefore defer the final decision on IP narcotics or not and discussion about overuse / risks of overuse to the day doc at 0700. 3. Use clonidine if needed for withdrawal symptoms (though I doubt she will have any before 0700). 4. Clinical opiate withdrawal score

## 2022-05-09 DIAGNOSIS — G894 Chronic pain syndrome: Secondary | ICD-10-CM | POA: Diagnosis not present

## 2022-05-09 DIAGNOSIS — R112 Nausea with vomiting, unspecified: Secondary | ICD-10-CM | POA: Diagnosis not present

## 2022-05-09 DIAGNOSIS — E1065 Type 1 diabetes mellitus with hyperglycemia: Secondary | ICD-10-CM | POA: Diagnosis not present

## 2022-05-09 LAB — BASIC METABOLIC PANEL
Anion gap: 8 (ref 5–15)
BUN: 15 mg/dL (ref 6–20)
CO2: 19 mmol/L — ABNORMAL LOW (ref 22–32)
Calcium: 8.8 mg/dL — ABNORMAL LOW (ref 8.9–10.3)
Chloride: 112 mmol/L — ABNORMAL HIGH (ref 98–111)
Creatinine, Ser: 0.77 mg/dL (ref 0.44–1.00)
GFR, Estimated: >60 mL/min (ref >60)
Glucose, Bld: 159 mg/dL — ABNORMAL HIGH (ref 70–99)
Potassium: 3.4 mmol/L — ABNORMAL LOW (ref 3.5–5.1)
Sodium: 139 mmol/L (ref 135–145)

## 2022-05-09 LAB — CBC
HCT: 37.7 % (ref 36.0–46.0)
Hemoglobin: 12.4 g/dL (ref 12.0–15.0)
MCH: 30.5 pg (ref 26.0–34.0)
MCHC: 32.9 g/dL (ref 30.0–36.0)
MCV: 92.6 fL (ref 80.0–100.0)
Platelets: 353 10*3/uL (ref 150–400)
RBC: 4.07 MIL/uL (ref 3.87–5.11)
RDW: 13.3 % (ref 11.5–15.5)
WBC: 7.6 10*3/uL (ref 4.0–10.5)
nRBC: 0 % (ref 0.0–0.2)

## 2022-05-09 LAB — IRON AND TIBC
Iron: 65 ug/dL (ref 28–170)
Saturation Ratios: 23 % (ref 10.4–31.8)
TIBC: 286 ug/dL (ref 250–450)
UIBC: 221 ug/dL

## 2022-05-09 LAB — FERRITIN: Ferritin: 146 ng/mL (ref 11–307)

## 2022-05-09 LAB — GLUCOSE, CAPILLARY
Glucose-Capillary: 185 mg/dL — ABNORMAL HIGH (ref 70–99)
Glucose-Capillary: 184 mg/dL — ABNORMAL HIGH (ref 70–99)
Glucose-Capillary: 143 mg/dL — ABNORMAL HIGH (ref 70–99)
Glucose-Capillary: 86 mg/dL (ref 70–99)

## 2022-05-09 LAB — VITAMIN D 25 HYDROXY (VIT D DEFICIENCY, FRACTURES): Vit D, 25-Hydroxy: 9.07 ng/mL — ABNORMAL LOW (ref 30–100)

## 2022-05-09 LAB — VITAMIN B12: Vitamin B-12: 3372 pg/mL — ABNORMAL HIGH (ref 180–914)

## 2022-05-09 MED ORDER — INSULIN DETEMIR 100 UNIT/ML ~~LOC~~ SOLN
30.0000 [IU] | Freq: Two times a day (BID) | SUBCUTANEOUS | Status: DC
  Administered 2022-05-10: 30 [IU] via SUBCUTANEOUS
  Filled 2022-05-09 (×3): qty 0.3

## 2022-05-09 MED ORDER — NYSTATIN 100000 UNIT/ML MT SUSP
5.0000 mL | Freq: Four times a day (QID) | OROMUCOSAL | Status: DC
  Administered 2022-05-09 – 2022-05-10 (×5): 500000 [IU] via ORAL
  Filled 2022-05-09 (×5): qty 5

## 2022-05-09 MED ORDER — POTASSIUM CHLORIDE CRYS ER 20 MEQ PO TBCR
40.0000 meq | EXTENDED_RELEASE_TABLET | Freq: Once | ORAL | Status: AC
  Administered 2022-05-09: 40 meq via ORAL
  Filled 2022-05-09: qty 2

## 2022-05-09 MED ORDER — PANTOPRAZOLE SODIUM 40 MG PO TBEC
40.0000 mg | DELAYED_RELEASE_TABLET | Freq: Two times a day (BID) | ORAL | Status: DC
  Administered 2022-05-09 – 2022-05-10 (×3): 40 mg via ORAL
  Filled 2022-05-09 (×3): qty 1

## 2022-05-09 NOTE — Progress Notes (Signed)
PROGRESS NOTE    [name]  CNO:[id number] DOB: [birth year] DOA: 05/07/2022 PCP: Jeanie Sewer, NP    Brief Narrative:  [name] is a 36 y.o. female with medical history significant of DM1, CPS, diabetic neuropathy.   Pt presents to ED with c/o N/V, abd pain, neuropathy pain.  Primarily neuropathy pain is worse today.   Pt ran out of hydrocodone because she was taking an extra pill a night and ran out early.  Very concerning is the fact that this is the second time this has happened in 2 months (last in July).     Assessment and Plan: * Nausea & vomiting Diabetic gastroparesis worsened by overuse of opiates? -reglan -avoid narcotics  Misuse of medication Pt continues to, by her own admission, overuse prescribed opiates and run out of meds early, h/o same earlier this summer documented during admission in July. -patient to call prescribing MD to get dose adjusted  ? Thrush -nystatin -added PPI for possible esophagitis.   Uncontrolled type 1 diabetes mellitus with hyperglycemia ([place]) -transitioned off insulin gtt  Chronic pain syndrome Patient to call neurology and get appointment Use clonidine for withdrawal symptoms if needed.      DVT prophylaxis: enoxaparin (LOVENOX) injection 40 mg Start: 05/08/22 1000 SCDs Start: 05/08/22 0200    Code Status: Full Code Family Communication:   Disposition Plan:  Level of care: Med-Surg Status is: Observation The patient will require care spanning > 2 midnights and should be moved to inpatient     Consultants:  none  Subjective: C/o food getting stuck in esophagus  Objective: Vitals:   05/09/22 0800 05/09/22 0816 05/09/22 0900 05/09/22 1000  BP: 118/78  104/79 106/89  Pulse: (!) 101  (!) 101 (!) 101  Resp: 15  (!) 23 20  Temp:  98.4 F (36.9 C)    TempSrc:  Oral    SpO2: 100%  99% 99%  Weight:      Height:        Intake/Output Summary (Last 24 hours) at 05/09/2022 1128 Last  data filed at 05/09/2022 1000 Gross per 24 hour  Intake 1945.56 ml  Output --  Net 1945.56 ml   Filed Weights   05/07/22 1428  Weight: 74.8 kg    Examination:   General: Appearance:     Overweight female in no acute distress   White plaques in mouth  Lungs:     respirations unlabored  Heart:    Tachycardic. Normal rhythm. No murmurs, rubs, or gallops.    MS:   All extremities are intact.    Neurologic:   Awake, alert- poor insight into disease process       Data Reviewed: I have personally reviewed following labs and imaging studies  CBC: Recent Labs  Lab 05/07/22 1435 05/07/22 1554 05/09/22 0301  WBC 8.6  --  7.6  NEUTROABS 6.4  --   --   HGB 12.8 13.3 12.4  HCT 36.6 39.0 37.7  MCV 86.3  --  92.6  PLT 418*  --  294   Basic Metabolic Panel: Recent Labs  Lab 05/08/22 0031 05/08/22 0304 05/08/22 0607 05/08/22 0956 05/09/22 0301  NA 133* 137 139 138 139  K 4.8 2.9* 2.9* 3.4* 3.4*  CL 101 106 107 108 112*  CO2 21* '22 24 26 '$ 19*  GLUCOSE 335* 294* 141* 148* 159*  BUN 25* 25* 24* 23* 15  CREATININE 0.91 0.88 0.78 0.80 0.77  CALCIUM 9.1 9.0 8.9 9.0 8.8*  MG  --   --  2.2  --   --    GFR: Estimated Creatinine Clearance: 101.5 mL/min (by C-G formula based on SCr of 0.77 mg/dL). Liver Function Tests: Recent Labs  Lab 05/07/22 1435  AST 7*  ALT 7  ALKPHOS 68  BILITOT 0.4  PROT 8.0  ALBUMIN 4.7   Recent Labs  Lab 05/07/22 1435  LIPASE <10*   No results for input(s): "AMMONIA" in the last 168 hours. Coagulation Profile: No results for input(s): "INR", "PROTIME" in the last 168 hours. Cardiac Enzymes: No results for input(s): "CKTOTAL", "CKMB", "CKMBINDEX", "TROPONINI" in the last 168 hours. BNP (last 3 results) No results for input(s): "PROBNP" in the last 8760 hours. HbA1C: No results for input(s): "HGBA1C" in the last 72 hours. CBG: Recent Labs  Lab 05/08/22 1257 05/08/22 1402 05/08/22 1610 05/08/22 2156 05/09/22 0740  GLUCAP 181* 169*  136* 116* 185*   Lipid Profile: No results for input(s): "CHOL", "HDL", "LDLCALC", "TRIG", "CHOLHDL", "LDLDIRECT" in the last 72 hours. Thyroid Function Tests: No results for input(s): "TSH", "T4TOTAL", "FREET4", "T3FREE", "THYROIDAB" in the last 72 hours. Anemia Panel: Recent Labs    05/09/22 0301  VITAMINB12 3,372*  FERRITIN 146  TIBC 286  IRON 65   Sepsis Labs: Recent Labs  Lab 05/07/22 1549 05/07/22 1750 05/07/22 1847  LATICACIDVEN 1.8 3.1* 1.9    Recent Results (from the past 240 hour(s))  MRSA Next Gen by PCR, Nasal     Status: None   Collection Time: 05/08/22  1:42 AM   Specimen: Nasal Mucosa; Nasal Swab  Result Value Ref Range Status   MRSA by PCR Next Gen NOT DETECTED NOT DETECTED Final    Comment: (NOTE) The GeneXpert MRSA Assay (FDA approved for NASAL specimens only), is one component of a comprehensive MRSA colonization surveillance program. It is not intended to diagnose MRSA infection nor to guide or monitor treatment for MRSA infections. Test performance is not FDA approved in patients less than 59 years old. Performed at [facility], [place] [address] [place], [place] [address]          Radiology Studies: CT ABDOMEN PELVIS W CONTRAST  Result Date: 05/07/2022 CLINICAL DATA:  Abdominal pain and several episodes of vomiting over the past few days. EXAM: CT ABDOMEN AND PELVIS WITH CONTRAST TECHNIQUE: Multidetector CT imaging of the abdomen and pelvis was performed using the standard protocol following bolus administration of intravenous contrast. RADIATION DOSE REDUCTION: This exam was performed according to the departmental dose-optimization program which includes automated exposure control, adjustment of the mA and/or kV according to patient size and/or use of iterative reconstruction technique. CONTRAST:  23m OMNIPAQUE IOHEXOL 300 MG/ML  SOLN COMPARISON:  02/15/2021 FINDINGS: Lower chest: The lung bases are clear of acute process. No  pleural effusion or pulmonary lesions. The heart is normal in size. No pericardial effusion. Mild distal esophageal wall thickening could suggest esophagitis. Recommend correlation with any symptoms of dysphagia. Hepatobiliary: No focal hepatic lesions or intrahepatic biliary dilatation. Small gallstones are noted the gallbladder. No findings to suggest acute cholecystitis. Normal caliber common bile duct. Pancreas: No mass, inflammation or ductal dilatation. Spleen: Normal size.  No focal lesions. Adrenals/Urinary Tract: The adrenal glands and kidneys are normal. The bladder is normal. Stomach/Bowel: The stomach, duodenum, small bowel and colon are unremarkable. No acute inflammatory process, mass lesions or obstructive findings. The terminal ileum and appendix are normal. Vascular/Lymphatic: The aorta is normal in caliber. No dissection. The branch vessels are  patent. The major venous structures are patent. No mesenteric or retroperitoneal mass or adenopathy. Small scattered lymph nodes are noted. Reproductive: Uterine fibroids are noted. The ovaries are unremarkable. Other: No pelvic mass or adenopathy. No free pelvic fluid collections. No inguinal mass or adenopathy. No abdominal wall hernia or subcutaneous lesions. Musculoskeletal: No significant bony findings. IMPRESSION: 1. No acute abdominal/pelvic findings, mass lesions or adenopathy. 2. Cholelithiasis without CT findings to suggest acute cholecystitis. 3. Uterine fibroids. 4. Mild distal esophageal wall thickening could suggest esophagitis. Recommend correlation with any symptoms of dysphagia. Electronically Signed   By: Marijo Sanes M.D.   On: 05/07/2022 17:05        Scheduled Meds:  Chlorhexidine Gluconate Cloth  6 each Topical Daily   enoxaparin (LOVENOX) injection  40 mg Subcutaneous Q24H   gabapentin  400 mg Oral TID   insulin aspart  0-5 Units Subcutaneous QHS   insulin aspart  0-9 Units Subcutaneous TID WC   insulin detemir  30 Units  Subcutaneous BID   metoCLOPramide (REGLAN) injection  5 mg Intravenous Q8H   nystatin  5 mL Oral QID   pantoprazole  40 mg Oral BID   Continuous Infusions:  lactated ringers Stopped (05/08/22 0448)   promethazine (PHENERGAN) injection (IM or IVPB)       LOS: 0 days    Time spent: 45 minutes spent on chart review, discussion with nursing staff, consultants, updating family and interview/physical exam; more than 50% of that time was spent in counseling and/or coordination of care.    Geradine Girt, DO [facility] Available via Epic secure chat 7am-7pm After these hours, please refer to coverage provider listed on [email or web address] 05/09/2022, 11:28 AM

## 2022-05-10 ENCOUNTER — Encounter (HOSPITAL_COMMUNITY): Payer: Self-pay | Admitting: Internal Medicine

## 2022-05-10 DIAGNOSIS — R112 Nausea with vomiting, unspecified: Secondary | ICD-10-CM | POA: Diagnosis not present

## 2022-05-10 DIAGNOSIS — R1084 Generalized abdominal pain: Secondary | ICD-10-CM

## 2022-05-10 DIAGNOSIS — G894 Chronic pain syndrome: Secondary | ICD-10-CM | POA: Diagnosis not present

## 2022-05-10 LAB — CBC
HCT: 33.7 % — ABNORMAL LOW (ref 36.0–46.0)
Hemoglobin: 10.9 g/dL — ABNORMAL LOW (ref 12.0–15.0)
MCH: 29.9 pg (ref 26.0–34.0)
MCHC: 32.3 g/dL (ref 30.0–36.0)
MCV: 92.6 fL (ref 80.0–100.0)
Platelets: 333 10*3/uL (ref 150–400)
RBC: 3.64 MIL/uL — ABNORMAL LOW (ref 3.87–5.11)
RDW: 13.2 % (ref 11.5–15.5)
WBC: 6.7 10*3/uL (ref 4.0–10.5)
nRBC: 0 % (ref 0.0–0.2)

## 2022-05-10 LAB — GLUCOSE, CAPILLARY
Glucose-Capillary: 106 mg/dL — ABNORMAL HIGH (ref 70–99)
Glucose-Capillary: 97 mg/dL (ref 70–99)
Glucose-Capillary: 172 mg/dL — ABNORMAL HIGH (ref 70–99)

## 2022-05-10 LAB — BASIC METABOLIC PANEL
Anion gap: 4 — ABNORMAL LOW (ref 5–15)
BUN: 12 mg/dL (ref 6–20)
CO2: 24 mmol/L (ref 22–32)
Calcium: 8.8 mg/dL — ABNORMAL LOW (ref 8.9–10.3)
Chloride: 113 mmol/L — ABNORMAL HIGH (ref 98–111)
Creatinine, Ser: 0.74 mg/dL (ref 0.44–1.00)
GFR, Estimated: >60 mL/min (ref >60)
Glucose, Bld: 105 mg/dL — ABNORMAL HIGH (ref 70–99)
Potassium: 3.1 mmol/L — ABNORMAL LOW (ref 3.5–5.1)
Sodium: 141 mmol/L (ref 135–145)

## 2022-05-10 LAB — MAGNESIUM: Magnesium: 2.2 mg/dL (ref 1.7–2.4)

## 2022-05-10 MED ORDER — DIPHENHYDRAMINE HCL 25 MG PO CAPS: 25.0000 mg | ORAL_CAPSULE | Freq: Four times a day (QID) | ORAL | Status: DC | PRN

## 2022-05-10 MED ORDER — NYSTATIN 100000 UNIT/ML MT SUSP: 5.0000 mL | Freq: Four times a day (QID) | OROMUCOSAL | 0 refills | Status: AC

## 2022-05-10 MED ORDER — NOVOLOG FLEXPEN 100 UNIT/ML ~~LOC~~ SOPN: 15.0000 [IU] | PEN_INJECTOR | Freq: Three times a day (TID) | SUBCUTANEOUS | 2 refills | Status: DC

## 2022-05-10 MED ORDER — METOCLOPRAMIDE HCL 5 MG PO TABS: 5.0000 mg | ORAL_TABLET | Freq: Three times a day (TID) | ORAL | 0 refills | Status: DC

## 2022-05-10 MED ORDER — LEVEMIR FLEXTOUCH 100 UNIT/ML ~~LOC~~ SOPN: PEN_INJECTOR | SUBCUTANEOUS | 0 refills | Status: DC

## 2022-05-10 MED ORDER — POTASSIUM CHLORIDE CRYS ER 20 MEQ PO TBCR
40.0000 meq | EXTENDED_RELEASE_TABLET | Freq: Once | ORAL | Status: AC
  Administered 2022-05-10: 40 meq via ORAL
  Filled 2022-05-10: qty 2

## 2022-05-10 MED ORDER — VITAMIN D 25 MCG (1000 UNIT) PO TABS
1000.0000 [IU] | ORAL_TABLET | Freq: Every day | ORAL | Status: DC
  Administered 2022-05-10: 1000 [IU] via ORAL
  Filled 2022-05-10: qty 1

## 2022-05-10 MED ORDER — PANTOPRAZOLE SODIUM 40 MG PO TBEC: 40.0000 mg | DELAYED_RELEASE_TABLET | Freq: Every day | ORAL | 0 refills | Status: DC

## 2022-05-10 NOTE — Discharge Summary (Signed)
Physician Discharge Summary  [name] HER:[id number] DOB: May 02, 1986 DOA: 05/07/2022  PCP: Jeanie Sewer, NP  Admit date: 05/07/2022 Discharge date: 05/10/2022  Admitted From: home Discharge disposition: home   Recommendations for Outpatient Follow-Up:   Will need continued titration of insulin for better blood sugar control avoiding lows Trial of reglan/PPI Finish nystatin (10-14 days pending response) Patient's pcp/neurologist has called in Neurontin and opioids for patient  BMP 1week   Discharge Diagnosis:   Principal Problem:   Nausea & vomiting Active Problems:   Uncontrolled type 1 diabetes mellitus with hyperglycemia ([place])   Misuse of medication   Chronic pain syndrome    Discharge Condition: Improved.  Diet recommendation:  Carbohydrate-modified.  Wound care: None.  Code status: Full.   History of Present Illness:   [name] is a 36 y.o. female with medical history significant of DM1, CPS, diabetic neuropathy.   Pt presents to ED with c/o N/V, abd pain, neuropathy pain.  Primarily neuropathy pain is worse today.   Pt ran out of hydrocodone because she was taking an extra pill a night and ran out early.  Very concerning is the fact that this is the second time this has happened in 2 months (last in July).   The ED doc gave IV dilaudid.  Which has worked for pain, pt now resting comfortably.     Hospital Course by Problem:   * Nausea & vomiting Diabetic gastroparesis worsened by overuse of opiates? -reglan x 1 month-- to follow up with PCP to discuss if working -PRN monitoring of Qtc -avoid narcotics   Misuse of medication Pt continues to, by her own admission, overuse prescribed opiates and run out of meds early, h/o same earlier this summer documented during admission in July. -patient to call prescribing MD to get dose adjusted   ? Thrush -nystatin -added PPI for possible esophagitis as well    Uncontrolled type 1 diabetes mellitus with hyperglycemia ([place]) -transitioned off insulin gtt to SQ insulin -only needing 30 daily here-- will need to be adjusted outpatient    Chronic pain syndrome Patient to call neurology and get appointment/refills  Hypokalemia -replete   Medical Consultants:      Discharge Exam:   Vitals:   05/10/22 0639 05/10/22 0939  BP: 119/87 108/69  Pulse: 87 93  Resp: 16 15  Temp: 98 F (36.7 C) 98.3 F (36.8 C)  SpO2: 100% 99%   Vitals:   05/09/22 2030 05/10/22 0038 05/10/22 0639 05/10/22 0939  BP: 101/71 104/83 119/87 108/69  Pulse: 94 92 87 93  Resp: '16 18 16 15  '$ Temp: 98.2 F (36.8 C) 97.9 F (36.6 C) 98 F (36.7 C) 98.3 F (36.8 C)  TempSrc: Oral Oral Oral Oral  SpO2: 100% 99% 100% 99%  Weight:      Height:        General exam: Appears calm and comfortable. Feeling much better and asking to go home   The results of significant diagnostics from this hospitalization (including imaging, microbiology, ancillary and laboratory) are listed below for reference.     Procedures and Diagnostic Studies:   CT ABDOMEN PELVIS W CONTRAST  Result Date: 05/07/2022 CLINICAL DATA:  Abdominal pain and several episodes of vomiting over the past few days. EXAM: CT ABDOMEN AND PELVIS WITH CONTRAST TECHNIQUE: Multidetector CT imaging of the abdomen and pelvis was performed using the standard protocol following bolus administration of intravenous contrast. RADIATION DOSE REDUCTION: This exam was  performed according to the departmental dose-optimization program which includes automated exposure control, adjustment of the mA and/or kV according to patient size and/or use of iterative reconstruction technique. CONTRAST:  71m OMNIPAQUE IOHEXOL 300 MG/ML  SOLN COMPARISON:  02/15/2021 FINDINGS: Lower chest: The lung bases are clear of acute process. No pleural effusion or pulmonary lesions. The heart is normal in size. No pericardial effusion. Mild distal  esophageal wall thickening could suggest esophagitis. Recommend correlation with any symptoms of dysphagia. Hepatobiliary: No focal hepatic lesions or intrahepatic biliary dilatation. Small gallstones are noted the gallbladder. No findings to suggest acute cholecystitis. Normal caliber common bile duct. Pancreas: No mass, inflammation or ductal dilatation. Spleen: Normal size.  No focal lesions. Adrenals/Urinary Tract: The adrenal glands and kidneys are normal. The bladder is normal. Stomach/Bowel: The stomach, duodenum, small bowel and colon are unremarkable. No acute inflammatory process, mass lesions or obstructive findings. The terminal ileum and appendix are normal. Vascular/Lymphatic: The aorta is normal in caliber. No dissection. The branch vessels are patent. The major venous structures are patent. No mesenteric or retroperitoneal mass or adenopathy. Small scattered lymph nodes are noted. Reproductive: Uterine fibroids are noted. The ovaries are unremarkable. Other: No pelvic mass or adenopathy. No free pelvic fluid collections. No inguinal mass or adenopathy. No abdominal wall hernia or subcutaneous lesions. Musculoskeletal: No significant bony findings. IMPRESSION: 1. No acute abdominal/pelvic findings, mass lesions or adenopathy. 2. Cholelithiasis without CT findings to suggest acute cholecystitis. 3. Uterine fibroids. 4. Mild distal esophageal wall thickening could suggest esophagitis. Recommend correlation with any symptoms of dysphagia. Electronically Signed   By: PMarijo SanesM.D.   On: 05/07/2022 17:05     Labs:   Basic Metabolic Panel: Recent Labs  Lab 05/08/22 0304 05/08/22 0607 05/08/22 0956 05/09/22 0301 05/10/22 0600  NA 137 139 138 139 141  K 2.9* 2.9* 3.4* 3.4* 3.1*  CL 106 107 108 112* 113*  CO2 '22 24 26 '$ 19* 24  GLUCOSE 294* 141* 148* 159* 105*  BUN 25* 24* 23* 15 12  CREATININE 0.88 0.78 0.80 0.77 0.74  CALCIUM 9.0 8.9 9.0 8.8* 8.8*  MG  --  2.2  --   --  2.2    GFR Estimated Creatinine Clearance: 101.5 mL/min (by C-G formula based on SCr of 0.74 mg/dL). Liver Function Tests: Recent Labs  Lab 05/07/22 1435  AST 7*  ALT 7  ALKPHOS 68  BILITOT 0.4  PROT 8.0  ALBUMIN 4.7   Recent Labs  Lab 05/07/22 1435  LIPASE <10*   No results for input(s): "AMMONIA" in the last 168 hours. Coagulation profile No results for input(s): "INR", "PROTIME" in the last 168 hours.  CBC: Recent Labs  Lab 05/07/22 1435 05/07/22 1554 05/09/22 0301 05/10/22 0600  WBC 8.6  --  7.6 6.7  NEUTROABS 6.4  --   --   --   HGB 12.8 13.3 12.4 10.9*  HCT 36.6 39.0 37.7 33.7*  MCV 86.3  --  92.6 92.6  PLT 418*  --  353 333   Cardiac Enzymes: No results for input(s): "CKTOTAL", "CKMB", "CKMBINDEX", "TROPONINI" in the last 168 hours. BNP: Invalid input(s): "POCBNP" CBG: Recent Labs  Lab 05/09/22 1622 05/09/22 2228 05/10/22 0128 05/10/22 0738 05/10/22 1143  GLUCAP 143* 86 106* 97 172*   D-Dimer No results for input(s): "DDIMER" in the last 72 hours. Hgb A1c No results for input(s): "HGBA1C" in the last 72 hours. Lipid Profile No results for input(s): "CHOL", "HDL", "LDLCALC", "TRIG", "CHOLHDL", "LDLDIRECT" in  the last 72 hours. Thyroid function studies No results for input(s): "TSH", "T4TOTAL", "T3FREE", "THYROIDAB" in the last 72 hours.  Invalid input(s): "FREET3" Anemia work up Recent Labs    05/09/22 0301  VITAMINB12 3,372*  FERRITIN 146  TIBC 286  IRON 65   Microbiology Recent Results (from the past 240 hour(s))  MRSA Next Gen by PCR, Nasal     Status: None   Collection Time: 05/08/22  1:42 AM   Specimen: Nasal Mucosa; Nasal Swab  Result Value Ref Range Status   MRSA by PCR Next Gen NOT DETECTED NOT DETECTED Final    Comment: (NOTE) The GeneXpert MRSA Assay (FDA approved for NASAL specimens only), is one component of a comprehensive MRSA colonization surveillance program. It is not intended to diagnose MRSA infection nor to  guide or monitor treatment for MRSA infections. Test performance is not FDA approved in patients less than 3 years old. Performed at [facility], [place] [address], [place], [place] [address]      Discharge Instructions:   Discharge Instructions     Diet Carb Modified   Complete by: As directed    Discharge instructions   Complete by: As directed    As you told me, your doctor is sending in Neurontin and pain medications for you I have add a trial of reglan and protonix for 1 month-- discuss with PCP if these mediations are helping you   Increase activity slowly   Complete by: As directed       Allergies as of 05/10/2022       Reactions   Pramipexole Shortness Of Breath        Medication List     STOP taking these medications    cyanocobalamin 1000 MCG/ML injection Commonly known as: VITAMIN B12       TAKE these medications    B-D UF III MINI PEN NEEDLES 31G X 5 MM Misc Generic drug: Insulin Pen Needle To use with Insulin pens, as directed   Dexcom G6 Transmitter Misc Inject 1 each into the skin every 14 (fourteen) days.   gabapentin 400 MG capsule Commonly known as: NEURONTIN Take 1 capsule (400 mg total) by mouth 3 (three) times daily.   Insulin Syringes (Disposable) U-100 1 ML Misc Inject 1 each into the skin in the morning and at bedtime.   Levemir FlexTouch 100 UNIT/ML FlexPen Generic drug: insulin detemir 30 units daily-- will need to adjust as you intake at home varies What changed: additional instructions   metoCLOPramide 5 MG tablet Commonly known as: Reglan Take 1 tablet (5 mg total) by mouth 3 (three) times daily before meals.   NovoLOG FlexPen 100 UNIT/ML FlexPen Generic drug: insulin aspart Inject 15-20 Units into the skin 3 (three) times daily with meals. plus sliding scale   nystatin 100000 UNIT/ML suspension Commonly known as: MYCOSTATIN Take 5 mLs (500,000 Units total) by mouth 4 (four) times daily for 10  days.   ondansetron 4 MG tablet Commonly known as: ZOFRAN Take 1 tablet (4 mg total) by mouth every 8 (eight) hours as needed for nausea or vomiting.   pantoprazole 40 MG tablet Commonly known as: PROTONIX Take 1 tablet (40 mg total) by mouth daily.   traZODone 100 MG tablet Commonly known as: DESYREL Take 100 mg by mouth at bedtime.        Follow-up Information     Jeanie Sewer, NP Follow up in 1 week(s).   Specialty: Family Medicine Contact information: [phone] [email or web address]  [place] [address] [phone]                  Time coordinating discharge: 45 min  Signed:  Geradine Girt DO  [facility] 05/10/2022, 1:46 PM

## 2022-05-24 ENCOUNTER — Inpatient Hospital Stay: Payer: BC Managed Care – PPO | Attending: Gynecologic Oncology | Admitting: Gynecologic Oncology

## 2022-05-24 DIAGNOSIS — N901 Moderate vulvar dysplasia: Secondary | ICD-10-CM

## 2022-05-31 ENCOUNTER — Telehealth: Payer: Self-pay | Admitting: *Deleted

## 2022-05-31 NOTE — Telephone Encounter (Signed)
Attempted to reach the patient to reschedule her missed appt, [facility] for the patient to call the office back

## 2022-06-02 ENCOUNTER — Encounter (HOSPITAL_COMMUNITY): Payer: Self-pay | Admitting: *Deleted

## 2022-06-02 ENCOUNTER — Other Ambulatory Visit: Payer: Self-pay

## 2022-06-02 ENCOUNTER — Emergency Department (HOSPITAL_COMMUNITY): Payer: BC Managed Care – PPO

## 2022-06-02 ENCOUNTER — Emergency Department (HOSPITAL_COMMUNITY)
Admission: EM | Admit: 2022-06-02 | Discharge: 2022-06-03 | Disposition: A | Discharge status: Home / Self Care | Payer: BC Managed Care – PPO | Attending: Emergency Medicine | Admitting: Emergency Medicine

## 2022-06-02 DIAGNOSIS — E1042 Type 1 diabetes mellitus with diabetic polyneuropathy: Secondary | ICD-10-CM | POA: Diagnosis not present

## 2022-06-02 DIAGNOSIS — G894 Chronic pain syndrome: Secondary | ICD-10-CM | POA: Insufficient documentation

## 2022-06-02 DIAGNOSIS — N3000 Acute cystitis without hematuria: Secondary | ICD-10-CM | POA: Insufficient documentation

## 2022-06-02 DIAGNOSIS — Z794 Long term (current) use of insulin: Secondary | ICD-10-CM | POA: Diagnosis not present

## 2022-06-02 DIAGNOSIS — R739 Hyperglycemia, unspecified: Secondary | ICD-10-CM | POA: Diagnosis present

## 2022-06-02 DIAGNOSIS — B379 Candidiasis, unspecified: Secondary | ICD-10-CM | POA: Insufficient documentation

## 2022-06-02 DIAGNOSIS — Z79899 Other long term (current) drug therapy: Secondary | ICD-10-CM | POA: Diagnosis not present

## 2022-06-02 DIAGNOSIS — G4700 Insomnia, unspecified: Secondary | ICD-10-CM | POA: Insufficient documentation

## 2022-06-02 LAB — BASIC METABOLIC PANEL
Anion gap: 8 (ref 5–15)
BUN: 9 mg/dL (ref 6–20)
CO2: 23 mmol/L (ref 22–32)
Calcium: 9.2 mg/dL (ref 8.9–10.3)
Chloride: 105 mmol/L (ref 98–111)
Creatinine, Ser: 0.65 mg/dL (ref 0.44–1.00)
GFR, Estimated: >60 mL/min (ref >60)
Glucose, Bld: 329 mg/dL — ABNORMAL HIGH (ref 70–99)
Potassium: 3.7 mmol/L (ref 3.5–5.1)
Sodium: 136 mmol/L (ref 135–145)

## 2022-06-02 LAB — CBC
HCT: 33 % — ABNORMAL LOW (ref 36.0–46.0)
Hemoglobin: 11.2 g/dL — ABNORMAL LOW (ref 12.0–15.0)
MCH: 30.3 pg (ref 26.0–34.0)
MCHC: 33.9 g/dL (ref 30.0–36.0)
MCV: 89.2 fL (ref 80.0–100.0)
Platelets: 321 10*3/uL (ref 150–400)
RBC: 3.7 MIL/uL — ABNORMAL LOW (ref 3.87–5.11)
RDW: 13.4 % (ref 11.5–15.5)
WBC: 8 10*3/uL (ref 4.0–10.5)
nRBC: 0 % (ref 0.0–0.2)

## 2022-06-02 LAB — I-STAT BETA HCG BLOOD, ED (MC, WL, AP ONLY): I-stat hCG, quantitative: <5 m[IU]/mL (ref <5)

## 2022-06-02 LAB — TROPONIN I (HIGH SENSITIVITY): Troponin I (High Sensitivity): <2 ng/L (ref <18)

## 2022-06-02 LAB — CBG MONITORING, ED: Glucose-Capillary: 283 mg/dL — ABNORMAL HIGH (ref 70–99)

## 2022-06-02 NOTE — ED Triage Notes (Addendum)
BIB EMS pt presents with hyperglycemia, CBG 390 and also chest pain started an hour ago. Pt has pain dure to neuropathy. 145/85-18 96%- 93% #22 left hand.

## 2022-06-02 NOTE — ED Provider Triage Note (Signed)
Emergency Medicine Provider Triage Evaluation Note  [name] , Garner 36 y.o. female  was evaluated in triage.  Pt complains of pain from diabetic neuropathy.  Reports being Garner type I diabetic and her blood sugar has been running high over the last couple of days.  Additionally, she is now having difficulty tolerating the pain from her neuropathy.  She says it is all over.  Takes trazodone and OxyContin but has run out of her Oxy  Also says that she is having chest pain which encouraged her to call 911  Review of Systems  Positive:  Negative:   Physical Exam  Ht '5\' 6"'$  (1.676 m)   Wt 70.8 kg   LMP 04/08/2022 (Approximate) Comment: preg test waiver signed  BMI 25.18 kg/m  Gen:   Awake, no distress   Resp:  Normal effort  MSK:   Moves extremities without difficulty  Other:  Alert and oriented in triage.  Rocking back and forth secondary to pain  Medical Decision Making  Medically screening exam initiated at 6:10 PM.  Appropriate orders placed.  [name] was informed that the remainder of the evaluation will be completed by another provider, this initial triage assessment does not replace that evaluation, and the importance of remaining in the ED until their evaluation is complete.  Per PDMP review patient's last fill of 90 oxycodone was on 9/25   Caitlyn Gay A, PA-C 06/02/22 1811

## 2022-06-03 ENCOUNTER — Other Ambulatory Visit: Payer: Self-pay | Admitting: Family

## 2022-06-03 DIAGNOSIS — G4709 Other insomnia: Secondary | ICD-10-CM

## 2022-06-03 LAB — URINALYSIS, ROUTINE W REFLEX MICROSCOPIC
Bilirubin Urine: NEGATIVE
Glucose, UA: >=500 mg/dL — AB
Hgb urine dipstick: NEGATIVE
Ketones, ur: 20 mg/dL — AB
Nitrite: POSITIVE — AB
Protein, ur: 30 mg/dL — AB
Specific Gravity, Urine: 1.027 (ref 1.005–1.030)
pH: 5 (ref 5.0–8.0)

## 2022-06-03 LAB — CBG MONITORING, ED: Glucose-Capillary: 260 mg/dL — ABNORMAL HIGH (ref 70–99)

## 2022-06-03 MED ORDER — FLUCONAZOLE 150 MG PO TABS
150.0000 mg | ORAL_TABLET | Freq: Once | ORAL | Status: AC
  Administered 2022-06-03: 150 mg via ORAL
  Filled 2022-06-03: qty 1

## 2022-06-03 MED ORDER — CEPHALEXIN 500 MG PO CAPS: 500.0000 mg | ORAL_CAPSULE | Freq: Two times a day (BID) | ORAL | 0 refills | Status: AC

## 2022-06-03 MED ORDER — OXYCODONE-ACETAMINOPHEN 5-325 MG PO TABS
2.0000 | ORAL_TABLET | Freq: Once | ORAL | Status: AC
  Administered 2022-06-03: 1 via ORAL
  Filled 2022-06-03: qty 2

## 2022-06-03 MED ORDER — CEPHALEXIN 500 MG PO CAPS
500.0000 mg | ORAL_CAPSULE | Freq: Once | ORAL | Status: AC
  Administered 2022-06-03: 500 mg via ORAL
  Filled 2022-06-03: qty 1

## 2022-06-03 MED ORDER — HYDROXYZINE HCL 25 MG PO TABS: 25.0000 mg | ORAL_TABLET | Freq: Every evening | ORAL | 0 refills | Status: DC | PRN

## 2022-06-03 MED ORDER — OXYCODONE-ACETAMINOPHEN 5-325 MG PO TABS
1.0000 | ORAL_TABLET | Freq: Once | ORAL | Status: AC
  Administered 2022-06-03: 1 via ORAL
  Filled 2022-06-03: qty 1

## 2022-06-03 NOTE — ED Provider Notes (Signed)
[name]  Provider Note  CSN: [id number] Arrival date & time: 06/02/22 1737  History Chief Complaint  Patient presents with   Chest Pain   Hyperglycemia    [name] is a 36 y.o. female with history of DM and chronic pain from neuropathy who called EMS for leg pain, typical for her neuropathy after running out of her home oxycodone ('10mg'$  x 90/month) about a week early due to taking extras. She ran out 2-3 days ago. She has not been able to sleep and her sugar has been elevated. She was not having any chest pain until she got on the ambulance at which time she states she felt like she had a panic attack. Otherwise she is not concerned about chest pains. She has a secondary complaint of dysuria and frequency, also noticed yeast infection recently. No fever.    Home Medications Prior to Admission medications   Medication Sig Start Date End Date Taking? Authorizing Provider  cephALEXin (KEFLEX) 500 MG capsule Take 1 capsule (500 mg total) by mouth 2 (two) times daily for 7 days. 06/03/22 06/10/22 Yes Truddie Hidden, MD  hydrOXYzine (ATARAX) 25 MG tablet Take 1 tablet (25 mg total) by mouth at bedtime as needed (insomnia). 06/03/22  Yes Truddie Hidden, MD  Continuous Blood Gluc Transmit (DEXCOM G6 TRANSMITTER) MISC Inject 1 each into the skin every 14 (fourteen) days. 12/31/21   Jeanie Sewer, NP  gabapentin (NEURONTIN) 400 MG capsule Take 1 capsule (400 mg total) by mouth 3 (three) times daily. Patient not taking: Reported on 05/07/2022 03/10/22   Kathie Dike, MD  insulin aspart (NOVOLOG FLEXPEN) 100 UNIT/ML FlexPen Inject 15-20 Units into the skin 3 (three) times daily with meals. plus sliding scale 05/10/22 06/09/22  Geradine Girt, DO  insulin detemir (LEVEMIR FLEXTOUCH) 100 UNIT/ML FlexPen 30 units daily-- will need to adjust as you intake at home varies 05/10/22   Eulogio Bear U, DO  Insulin Pen Needle (B-D UF III MINI  PEN NEEDLES) 31G X 5 MM MISC To use with Insulin pens, as directed 11/20/21   Jeanie Sewer, NP  Insulin Syringes, Disposable, U-100 1 ML MISC Inject 1 each into the skin in the morning and at bedtime. 08/10/21   Jeanie Sewer, NP  metoCLOPramide (REGLAN) 5 MG tablet Take 1 tablet (5 mg total) by mouth 3 (three) times daily before meals. 05/10/22 05/10/23  Geradine Girt, DO  ondansetron (ZOFRAN) 4 MG tablet Take 1 tablet (4 mg total) by mouth every 8 (eight) hours as needed for nausea or vomiting. 03/10/22   Kathie Dike, MD  pantoprazole (PROTONIX) 40 MG tablet Take 1 tablet (40 mg total) by mouth daily. 05/10/22   Geradine Girt, DO  traZODone (DESYREL) 100 MG tablet Take 100 mg by mouth at bedtime. 03/18/22   [provider]     Allergies    Pramipexole   Review of Systems   Review of Systems Please see HPI for pertinent positives and negatives  Physical Exam BP 98/62   Pulse 98   Temp 98.4 F (36.9 C) (Oral)   Resp 20   Ht '5\' 6"'$  (1.676 m)   Wt 70.8 kg   LMP 04/08/2022 (Approximate) Comment: preg test waiver signed  SpO2 100%   BMI 25.18 kg/m   Physical Exam Vitals and nursing note reviewed.  Constitutional:      Appearance: Normal appearance.  HENT:     Head: Normocephalic and atraumatic.  Nose: Nose normal.     Mouth/Throat:     Mouth: Mucous membranes are moist.  Eyes:     Extraocular Movements: Extraocular movements intact.     Conjunctiva/sclera: Conjunctivae normal.  Cardiovascular:     Rate and Rhythm: Normal rate.  Pulmonary:     Effort: Pulmonary effort is normal.     Breath sounds: Normal breath sounds.  Abdominal:     General: Abdomen is flat.     Palpations: Abdomen is soft.     Tenderness: There is no abdominal tenderness.  Musculoskeletal:        General: No swelling. Normal range of motion.     Cervical back: Neck supple.  Skin:    General: Skin is warm and dry.  Neurological:     General: No focal deficit present.      Mental Status: She is alert.  Psychiatric:        Mood and Affect: Mood normal.     ED Results / Procedures / Treatments   EKG EKG Interpretation  Date/Time:  Sunday June 02 2022 18:16:02 EDT Ventricular Rate:  108 PR Interval:  114 QRS Duration: 88 QT Interval:  356 QTC Calculation: 478 R Axis:   69 Text Interpretation: Sinus tachycardia Low voltage, precordial leads Borderline prolonged QT interval Since last tracing Rate faster Confirmed by Calvert Cantor [phone]) on 06/03/2022 3:48:47 AM  Procedures Procedures  Medications Ordered in the ED Medications  oxyCODONE-acetaminophen (PERCOCET/ROXICET) 5-325 MG per tablet 2 tablet (1 tablet Oral Given 06/03/22 0408)  cephALEXin (KEFLEX) capsule 500 mg (500 mg Oral Given 06/03/22 0409)  fluconazole (DIFLUCAN) tablet 150 mg (150 mg Oral Given 06/03/22 0409)  oxyCODONE-acetaminophen (PERCOCET/ROXICET) 5-325 MG per tablet 1 tablet (1 tablet Oral Given 06/03/22 0420)    Initial Impression and Plan  Patient arrives seeking treatment for her chronic pain and for possible UTI. Chest pain was due to anxiety and not her primary concern. Labs done in triage showed CBC with mild anemia, BMP with mild hyperglycemia but no signs of DKA. UA with signs of infection. Trop was neg. I personally viewed the images from radiology studies and agree with radiologist interpretation: CXR is clear. Patient advised she will need to discuss her pain control with her Pain Management doctor particularly in light of running out a week early. Offered her one oral dose of her usual meds here as well as toradol which she refused. She is asking for something to help her sleep, recommend Vistaril. Will also treat for UTI, also given diflucan here for yeast infection symptoms.   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 Decision-making details documented in ED Course. Radiology: ordered and independent interpretation performed. Decision-making details documented in ED Course. ECG/medicine tests: ordered and independent interpretation performed. Decision-making details documented in ED Course.  Risk Prescription drug management.    Final Clinical Impression(s) / ED Diagnoses Final diagnoses:  Diabetic polyneuropathy associated with type 1 diabetes mellitus (HCC)  Chronic pain syndrome  Acute cystitis without hematuria  Yeast infection  Insomnia, unspecified type    Rx /  DC Orders ED Discharge Orders          Ordered    cephALEXin (KEFLEX) 500 MG capsule  2 times daily        06/03/22 0420    hydrOXYzine (ATARAX) 25 MG tablet  At bedtime PRN        06/03/22 0420             Truddie Hidden, MD 06/03/22 [phone]

## 2022-06-26 NOTE — BH Specialist Note (Signed)
Opened in error

## 2022-09-05 ENCOUNTER — Emergency Department (HOSPITAL_BASED_OUTPATIENT_CLINIC_OR_DEPARTMENT_OTHER)
Admission: EM | Admit: 2022-09-05 | Discharge: 2022-09-06 | Discharge status: Against Medical Advice | Payer: BC Managed Care – PPO | Attending: Emergency Medicine | Admitting: Emergency Medicine

## 2022-09-05 ENCOUNTER — Other Ambulatory Visit: Payer: Self-pay

## 2022-09-05 ENCOUNTER — Encounter (HOSPITAL_BASED_OUTPATIENT_CLINIC_OR_DEPARTMENT_OTHER): Payer: Self-pay | Admitting: Emergency Medicine

## 2022-09-05 DIAGNOSIS — R1013 Epigastric pain: Secondary | ICD-10-CM | POA: Insufficient documentation

## 2022-09-05 DIAGNOSIS — E1065 Type 1 diabetes mellitus with hyperglycemia: Secondary | ICD-10-CM | POA: Diagnosis not present

## 2022-09-05 DIAGNOSIS — R739 Hyperglycemia, unspecified: Secondary | ICD-10-CM

## 2022-09-05 DIAGNOSIS — Z5329 Procedure and treatment not carried out because of patient's decision for other reasons: Secondary | ICD-10-CM | POA: Insufficient documentation

## 2022-09-05 DIAGNOSIS — Z794 Long term (current) use of insulin: Secondary | ICD-10-CM | POA: Insufficient documentation

## 2022-09-05 DIAGNOSIS — R112 Nausea with vomiting, unspecified: Secondary | ICD-10-CM

## 2022-09-05 DIAGNOSIS — R197 Diarrhea, unspecified: Secondary | ICD-10-CM | POA: Insufficient documentation

## 2022-09-05 DIAGNOSIS — R1012 Left upper quadrant pain: Secondary | ICD-10-CM | POA: Diagnosis not present

## 2022-09-05 DIAGNOSIS — E1110 Type 2 diabetes mellitus with ketoacidosis without coma: Secondary | ICD-10-CM | POA: Diagnosis present

## 2022-09-05 LAB — I-STAT VENOUS BLOOD GAS, ED
Acid-Base Excess: 3 mmol/L — ABNORMAL HIGH (ref 0.0–2.0)
Bicarbonate: 22.6 mmol/L (ref 20.0–28.0)
Calcium, Ion: 1.14 mmol/L — ABNORMAL LOW (ref 1.15–1.40)
HCT: 39 % (ref 36.0–46.0)
Hemoglobin: 13.3 g/dL (ref 12.0–15.0)
O2 Saturation: 88 %
Patient temperature: 98.1
Potassium: 4.5 mmol/L (ref 3.5–5.1)
Sodium: 128 mmol/L — ABNORMAL LOW (ref 135–145)
TCO2: 23 mmol/L (ref 22–32)
pCO2, Ven: 22 mmHg — ABNORMAL LOW (ref 44–60)
pH, Ven: 7.619 (ref 7.25–7.43)
pO2, Ven: 43 mmHg (ref 32–45)

## 2022-09-05 LAB — CBC WITH DIFFERENTIAL/PLATELET
Abs Immature Granulocytes: 0.1 10*3/uL — ABNORMAL HIGH (ref 0.00–0.07)
Basophils Absolute: 0 10*3/uL (ref 0.0–0.1)
Basophils Relative: 0 %
Eosinophils Absolute: 0 10*3/uL (ref 0.0–0.5)
Eosinophils Relative: 0 %
HCT: 37.4 % (ref 36.0–46.0)
Hemoglobin: 13 g/dL (ref 12.0–15.0)
Immature Granulocytes: 1 %
Lymphocytes Relative: 14 %
Lymphs Abs: 2.1 10*3/uL (ref 0.7–4.0)
MCH: 30.1 pg (ref 26.0–34.0)
MCHC: 34.8 g/dL (ref 30.0–36.0)
MCV: 86.6 fL (ref 80.0–100.0)
Monocytes Absolute: 0.4 10*3/uL (ref 0.1–1.0)
Monocytes Relative: 3 %
Neutro Abs: 12.5 10*3/uL — ABNORMAL HIGH (ref 1.7–7.7)
Neutrophils Relative %: 82 %
Platelets: 363 10*3/uL (ref 150–400)
RBC: 4.32 MIL/uL (ref 3.87–5.11)
RDW: 12.6 % (ref 11.5–15.5)
WBC: 15.1 10*3/uL — ABNORMAL HIGH (ref 4.0–10.5)
nRBC: 0 % (ref 0.0–0.2)

## 2022-09-05 LAB — CBG MONITORING, ED: Glucose-Capillary: 535 mg/dL (ref 70–99)

## 2022-09-05 MED ORDER — LACTATED RINGERS IV BOLUS
20.0000 mL/kg | Freq: Once | INTRAVENOUS | Status: AC
  Administered 2022-09-05: 1416 mL via INTRAVENOUS

## 2022-09-05 MED ORDER — HALOPERIDOL LACTATE 5 MG/ML IJ SOLN
2.0000 mg | Freq: Once | INTRAMUSCULAR | Status: AC
  Administered 2022-09-06: 2 mg via INTRAVENOUS
  Filled 2022-09-05: qty 1

## 2022-09-05 MED ORDER — ONDANSETRON HCL 4 MG/2ML IJ SOLN
4.0000 mg | Freq: Once | INTRAMUSCULAR | Status: AC
  Administered 2022-09-05: 4 mg via INTRAVENOUS
  Filled 2022-09-05: qty 2

## 2022-09-05 NOTE — ED Triage Notes (Signed)
  Patient comes in with emesis that has been going on for the last 24 hours.  Patient states she has type 1 diabetes and has not been able to keep anything down.  Starting having loose stools this evening.  Patient endorses lower abdominal cramping.  Pain 10/10.

## 2022-09-05 NOTE — ED Provider Notes (Signed)
[facility] EMERGENC[facility] DEPT Provider Note  CSN: [id number] Arrival date & time: 09/05/22 2305  Chief Complaint(s) Emesis and Diarrhea ED Triage Notes Shanon Ace, RN (Registered Nurse)  Emergency Medicine  Date of Service: 09/05/2022 11:13 PM  Signed   Patient comes in with emesis that has been going on for the last 24 hours.  Patient states she has type 1 diabetes and has not been able to keep anything down.  Starting having loose stools this evening.  Patient endorses lower abdominal cramping.  Pain 10/10.     HPI [name] For[place] is a 37 y.o. female with a past medical history listed below including type 1 diabetes, gastroparesis and chronic pain syndrome who presents to the emergency department for 2 days of nausea, vomiting with 1 day of loose diarrhea.  Patient has been feeling weak and unable to check her sugars and take her medications.  She is endorsing epigastric and left upper quadrant abdominal pain.  She denies any fevers or cough.  No chest pain or shortness of breath.  No urinary symptoms.  She denies any alcohol or illicit drug use.  The history is provided by the patient.    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   AKI (acute kidney injury) ([place]) 03/09/2022   UTI (urinary tract infection) 03/09/2022   Chronic pain syndrome 03/09/2022   Vulvar dysplasia    Type 1 diabetes mellitus with hyperlipidemia ([place]) 12/02/2021   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   Gastroesophageal reflux disease with esophagitis without hemorrhage 11/28/2021   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 Authorizing Provider  Continuous Blood Gluc Transmit (DEXCOM G6 TRANSMITTER) MISC Inject 1 each into the skin every 14 (fourteen) days. 12/31/21   Jeanie Sewer, NP  gabapentin (NEURONTIN) 400 MG capsule Take 1 capsule (400 mg total) by mouth 3 (three) times daily. Patient not taking: Reported on 05/07/2022 03/10/22   Kathie Dike, MD  hydrOXYzine (ATARAX) 25 MG tablet Take 1 tablet (25 mg total) by mouth at bedtime as needed (insomnia). 06/03/22   Truddie Hidden, MD  insulin aspart (NOVOLOG FLEXPEN) 100 UNIT/ML FlexPen Inject 15-20 Units into the skin 3 (three) times daily with meals. plus sliding scale 05/10/22 06/09/22  Geradine Girt, DO  insulin detemir (LEVEMIR FLEXTOUCH) 100 UNIT/ML FlexPen 30 units daily-- will need to adjust as you intake at home varies 05/10/22   Eulogio Bear U, DO  Insulin Pen Needle (B-D UF III MINI PEN NEEDLES) 31G X 5 MM MISC To use with Insulin pens, as directed 11/20/21   Jeanie Sewer, NP  Insulin Syringes, Disposable,  U-100 1 ML MISC Inject 1 each into the skin in the morning and at bedtime. 08/10/21   Jeanie Sewer, NP  metoCLOPramide (REGLAN) 5 MG tablet Take 1 tablet (5 mg total) by mouth 3 (three) times daily before meals. 05/10/22 05/10/23  Geradine Girt, DO  ondansetron (ZOFRAN) 4 MG tablet Take 1 tablet (4 mg total) by mouth every 8 (eight) hours as needed for nausea or vomiting. 03/10/22   Kathie Dike, MD  pantoprazole (PROTONIX)  40 MG tablet Take 1 tablet (40 mg total) by mouth daily. 05/10/22   Geradine Girt, DO  traZODone (DESYREL) 100 MG tablet Take 100 mg by mouth at bedtime. 03/18/22   [provider]                                                                                                                                    Allergies Pramipexole  Review of Systems Review of Systems As noted in HPI  Physical Exam Vital Signs  I have reviewed the triage vital signs BP (!) 176/101   Pulse (!) 110   Temp 98.1 F (36.7 C) (Oral)   Resp 20   Ht '5\' 6"'$  (1.676 m)   Wt 70.8 kg   LMP 08/20/2022   SpO2 100%   BMI 25.18 kg/m   Physical Exam Vitals reviewed.  Constitutional:      General: She is not in acute distress.    Appearance: She is well-developed. She is ill-appearing. She is not diaphoretic.  HENT:     Head: Normocephalic and atraumatic.     Nose: Nose normal.  Eyes:     General: No scleral icterus.       Right eye: No discharge.        Left eye: No discharge.     Conjunctiva/sclera: Conjunctivae normal.     Pupils: Pupils are equal, round, and reactive to light.  Cardiovascular:     Rate and Rhythm: Normal rate and regular rhythm.     Heart sounds: No murmur heard.    No friction rub. No gallop.  Pulmonary:     Effort: Pulmonary effort is normal. No respiratory distress.     Breath sounds: Normal breath sounds. No stridor. No rales.  Abdominal:     General: There is no distension.     Palpations: Abdomen is soft.     Tenderness: There is abdominal tenderness in the epigastric area and left upper quadrant.  Musculoskeletal:        General: No tenderness.     Cervical back: Normal range of motion and neck supple.  Skin:    General: Skin is warm and dry.     Findings: No erythema or rash.  Neurological:     Mental Status: She is alert and oriented to person, place, and time.     ED Results and Treatments Labs (all labs ordered are listed, but only abnormal results  are displayed) Labs Reviewed  BETA-HYDROXYBUTYRIC ACID - Abnormal; Notable for the following components:      Result Value   Beta-Hydroxybutyric Acid 3.04 (*)    All other components within normal limits  CBC WITH DIFFERENTIAL/PLATELET - Abnormal; Notable for the following components:   WBC 15.1 (*)    Neutro Abs 12.5 (*)    Abs Immature Granulocytes 0.10 (*)    All other components within normal limits  COMPREHENSIVE METABOLIC PANEL - Abnormal; Notable for the following components:   Sodium 128 (*)    Chloride 88 (*)    Glucose, Bld 529 (*)    Calcium 10.4 (*)    Total Protein 8.3 (*)    Anion gap 17 (*)    All other components within normal limits  LIPASE, BLOOD - Abnormal; Notable for the following components:   Lipase <10 (*)    All other components within normal limits  CBG MONITORING, ED - Abnormal; Notable for the following components:   Glucose-Capillary 535 (*)    All other components within normal limits  I-STAT VENOUS BLOOD GAS, ED - Abnormal; Notable for the following components:   pH, Ven 7.619 (*)    pCO2, Ven 22.0 (*)    Acid-Base Excess 3.0 (*)    Sodium 128 (*)    Calcium, Ion 1.14 (*)    All other components within normal limits  CBG MONITORING, ED - Abnormal; Notable for the following components:   Glucose-Capillary 403 (*)    All other components within normal limits  CBG MONITORING, ED - Abnormal; Notable for the following components:   Glucose-Capillary 403 (*)    All other components within normal limits  CBG MONITORING, ED - Abnormal; Notable for the following components:   Glucose-Capillary 387 (*)    All other components within normal limits  HCG, QUANTITATIVE, PREGNANCY  BETA-HYDROXYBUTYRIC ACID  URINALYSIS, ROUTINE W REFLEX MICROSCOPIC  BETA-HYDROXYBUTYRIC ACID  CBG MONITORING, ED  CBG MONITORING, ED  I-STAT VENOUS BLOOD GAS, ED                                                                                                                          EKG  EKG Interpretation  Date/Time:  Friday September 06 2022 05:18:00 EST Ventricular Rate:  108 PR Interval:  177 QRS Duration: 81 QT Interval:  352 QTC Calculation: 472 R Axis:   81 Text Interpretation: Sinus tachycardia Consider right atrial enlargement Confirmed by Addison Lank ([id number]) on 09/06/2022 5:19:29 AM       Radiology No results found.  Medications Ordered in ED Medications  sodium chloride 0.9 % bolus 1,000 mL (0 mLs Intravenous Stopped 09/06/22 0454)    Followed by  0.9 %  sodium chloride infusion (1,000 mLs Intravenous New Bag/Given 09/06/22 0345)  magnesium sulfate IVPB 2 g 50 mL (2 g Intravenous New Bag/Given 09/06/22 0554)  insulin regular, human (MYXREDLIN) 100 units/ 100 mL infusion (has no administration in time range)  dextrose 5 %  in lactated ringers infusion (has no administration in time range)  dextrose 50 % solution 0-50 mL (has no administration in time range)  0.9 %  sodium chloride infusion (has no administration in time range)  lactated ringers bolus 1,416 mL (0 mLs Intravenous Stopped 09/06/22 0125)  ondansetron (ZOFRAN) injection 4 mg (4 mg Intravenous Given 09/05/22 2338)  haloperidol lactate (HALDOL) injection 2 mg (2 mg Intravenous Given 09/06/22 0014)  insulin aspart (novoLOG) injection 7 Units (7 Units Intravenous Given 09/06/22 0117)  prochlorperazine (COMPAZINE) injection 10 mg (10 mg Intravenous Given 09/06/22 0114)  insulin aspart (novoLOG) injection 8 Units (8 Units Intravenous Given 09/06/22 0344)  ondansetron (ZOFRAN) injection 4 mg (4 mg Intravenous Given 09/06/22 0550)  alum & mag hydroxide-simeth (MAALOX/MYLANTA) 200-200-20 MG/5ML suspension 30 mL (30 mLs Oral Given 09/06/22 0551)    And  lidocaine (XYLOCAINE) 2 % viscous mouth solution 15 mL (15 mLs Oral Given 09/06/22 0551)                                                                                                                                     Procedures .1-3 Lead EKG  Interpretation  Performed by: Fatima Blank, MD Authorized by: Fatima Blank, MD     Interpretation: normal     ECG rate:  98   ECG rate assessment: normal     Rhythm: sinus rhythm     Ectopy: none     Conduction: normal   .Critical Care  Performed by: Fatima Blank, MD Authorized by: Fatima Blank, MD   Critical care provider statement:    Critical care time (minutes):  80   Critical care time was exclusive of:  Separately billable procedures and treating other patients   Critical care was necessary to treat or prevent imminent or life-threatening deterioration of the following conditions:  Metabolic crisis and dehydration   Critical care was time spent personally by me on the following activities:  Development of treatment plan with patient or surrogate, discussions with consultants, evaluation of patient's response to treatment, examination of patient, obtaining history from patient or surrogate, review of old charts, re-evaluation of patient's condition, pulse oximetry, ordering and review of radiographic studies, ordering and review of laboratory studies and ordering and performing treatments and interventions   Care discussed with: admitting provider     (including critical care time)  Medical Decision Making / ED Course   Medical Decision Making Amount and/or Complexity of Data Reviewed Labs: ordered. Decision-making details documented in ED Course. ECG/medicine tests: ordered and independent interpretation performed. Decision-making details documented in ED Course.  Risk OTC drugs. Prescription drug management. Decision regarding hospitalization.    Abdominal pain with nausea and vomiting.  Differential includes but not limited to: DKA, gastroparesis flare, cyclical vomiting flare, pancreatitis, biliary disease, opiate withdrawal, gastroenteritis.  Initial CBG of 535. Patient started on IV fluids and given antiemetics.  EKG  obtained  to assess QTc.  Appropriate for Haldol which was given.  Workup: CBC with leukocytosis.  No anemia. Metabolic panel with hyperglycemia with anion gap but no acidosis.  Possible early DKA vs starvation ketosis No evidence of biliary obstruction or pancreatitis VBG not consistent with acidosis.  Actually respiratory alkalosis BHa of 3 UA pending  Patient given a dose of insulin bolus.  On reassessment patient is still complaining of nausea.  She was given Compazine. Repeat CBG of 400. Given additional fluids and second dose of insulin.  Reassessment. CBG 380s Patient still having nausea. Given additional antiemetics and GI cocktail     Case discussed with Dr. Alcario Drought hospitalist service who requested patient be started on insulin drip for possible early diabetic ketosis.   Final Clinical Impression(s) / ED Diagnoses Final diagnoses:  Hyperglycemia  Intractable nausea and vomiting           This chart was dictated using voice recognition software.  Despite best efforts to proofread,  errors can occur which can change the documentation meaning.    Fatima Blank, MD 09/06/22 [phone]

## 2022-09-06 ENCOUNTER — Encounter (HOSPITAL_COMMUNITY): Payer: Self-pay

## 2022-09-06 DIAGNOSIS — E1110 Type 2 diabetes mellitus with ketoacidosis without coma: Secondary | ICD-10-CM | POA: Diagnosis present

## 2022-09-06 LAB — I-STAT VENOUS BLOOD GAS, ED
Acid-Base Excess: 2 mmol/L (ref 0.0–2.0)
Bicarbonate: 27.2 mmol/L (ref 20.0–28.0)
Calcium, Ion: 1.16 mmol/L (ref 1.15–1.40)
HCT: 38 % (ref 36.0–46.0)
Hemoglobin: 12.9 g/dL (ref 12.0–15.0)
O2 Saturation: 76 %
Patient temperature: 98.1
Potassium: 5.5 mmol/L — ABNORMAL HIGH (ref 3.5–5.1)
Sodium: 130 mmol/L — ABNORMAL LOW (ref 135–145)
TCO2: 29 mmol/L (ref 22–32)
pCO2, Ven: 44 mmHg (ref 44–60)
pH, Ven: 7.398 (ref 7.25–7.43)
pO2, Ven: 41 mmHg (ref 32–45)

## 2022-09-06 LAB — URINALYSIS, ROUTINE W REFLEX MICROSCOPIC
Bilirubin Urine: NEGATIVE
Glucose, UA: >1000 mg/dL — AB
Hgb urine dipstick: NEGATIVE
Ketones, ur: >80 mg/dL — AB
Leukocytes,Ua: NEGATIVE
Nitrite: NEGATIVE
Protein, ur: 30 mg/dL — AB
Specific Gravity, Urine: 1.03 (ref 1.005–1.030)
pH: 5.5 (ref 5.0–8.0)

## 2022-09-06 LAB — COMPREHENSIVE METABOLIC PANEL
ALT: 25 U/L (ref 0–44)
AST: 22 U/L (ref 15–41)
Albumin: 4.6 g/dL (ref 3.5–5.0)
Alkaline Phosphatase: 103 U/L (ref 38–126)
Anion gap: 17 — ABNORMAL HIGH (ref 5–15)
BUN: 17 mg/dL (ref 6–20)
CO2: 23 mmol/L (ref 22–32)
Calcium: 10.4 mg/dL — ABNORMAL HIGH (ref 8.9–10.3)
Chloride: 88 mmol/L — ABNORMAL LOW (ref 98–111)
Creatinine, Ser: 0.91 mg/dL (ref 0.44–1.00)
GFR, Estimated: >60 mL/min (ref >60)
Glucose, Bld: 529 mg/dL (ref 70–99)
Potassium: 5.1 mmol/L (ref 3.5–5.1)
Sodium: 128 mmol/L — ABNORMAL LOW (ref 135–145)
Total Bilirubin: 0.6 mg/dL (ref 0.3–1.2)
Total Protein: 8.3 g/dL — ABNORMAL HIGH (ref 6.5–8.1)

## 2022-09-06 LAB — BASIC METABOLIC PANEL
Anion gap: 18 — ABNORMAL HIGH (ref 5–15)
Anion gap: 9 (ref 5–15)
BUN: 18 mg/dL (ref 6–20)
BUN: 18 mg/dL (ref 6–20)
CO2: 23 mmol/L (ref 22–32)
CO2: 28 mmol/L (ref 22–32)
Calcium: 9.4 mg/dL (ref 8.9–10.3)
Calcium: 9.6 mg/dL (ref 8.9–10.3)
Chloride: 94 mmol/L — ABNORMAL LOW (ref 98–111)
Chloride: 100 mmol/L (ref 98–111)
Creatinine, Ser: 0.87 mg/dL (ref 0.44–1.00)
Creatinine, Ser: 0.8 mg/dL (ref 0.44–1.00)
GFR, Estimated: >60 mL/min (ref >60)
GFR, Estimated: >60 mL/min (ref >60)
Glucose, Bld: 419 mg/dL — ABNORMAL HIGH (ref 70–99)
Glucose, Bld: 216 mg/dL — ABNORMAL HIGH (ref 70–99)
Potassium: 4.3 mmol/L (ref 3.5–5.1)
Potassium: 3.6 mmol/L (ref 3.5–5.1)
Sodium: 135 mmol/L (ref 135–145)
Sodium: 137 mmol/L (ref 135–145)

## 2022-09-06 LAB — HCG, QUANTITATIVE, PREGNANCY: hCG, Beta Chain, Quant, S: <1 m[IU]/mL (ref <5)

## 2022-09-06 LAB — CBG MONITORING, ED
Glucose-Capillary: 403 mg/dL — ABNORMAL HIGH (ref 70–99)
Glucose-Capillary: 403 mg/dL — ABNORMAL HIGH (ref 70–99)
Glucose-Capillary: 387 mg/dL — ABNORMAL HIGH (ref 70–99)
Glucose-Capillary: 402 mg/dL — ABNORMAL HIGH (ref 70–99)
Glucose-Capillary: 341 mg/dL — ABNORMAL HIGH (ref 70–99)
Glucose-Capillary: 262 mg/dL — ABNORMAL HIGH (ref 70–99)
Glucose-Capillary: 247 mg/dL — ABNORMAL HIGH (ref 70–99)
Glucose-Capillary: 206 mg/dL — ABNORMAL HIGH (ref 70–99)

## 2022-09-06 LAB — LIPASE, BLOOD: Lipase: <10 U/L — ABNORMAL LOW (ref 11–51)

## 2022-09-06 LAB — BETA-HYDROXYBUTYRIC ACID
Beta-Hydroxybutyric Acid: 3.04 mmol/L — ABNORMAL HIGH (ref 0.05–0.27)
Beta-Hydroxybutyric Acid: 4.38 mmol/L — ABNORMAL HIGH (ref 0.05–0.27)

## 2022-09-06 MED ORDER — DEXTROSE IN LACTATED RINGERS 5 % IV SOLN: INTRAVENOUS | Status: DC

## 2022-09-06 MED ORDER — ACETAMINOPHEN 325 MG PO TABS
650.0000 mg | ORAL_TABLET | Freq: Once | ORAL | Status: AC
  Administered 2022-09-06: 650 mg via ORAL
  Filled 2022-09-06: qty 2

## 2022-09-06 MED ORDER — INSULIN REGULAR(HUMAN) IN NACL 100-0.9 UT/100ML-% IV SOLN
INTRAVENOUS | Status: DC
  Administered 2022-09-06: 15 [IU]/h via INTRAVENOUS
  Filled 2022-09-06: qty 100

## 2022-09-06 MED ORDER — OXYCODONE HCL 5 MG PO TABS
10.0000 mg | ORAL_TABLET | Freq: Once | ORAL | Status: AC
  Administered 2022-09-06: 10 mg via ORAL
  Filled 2022-09-06: qty 2

## 2022-09-06 MED ORDER — SODIUM CHLORIDE 0.9 % IV BOLUS (SEPSIS)
1000.0000 mL | Freq: Once | INTRAVENOUS | Status: AC
  Administered 2022-09-06: 1000 mL via INTRAVENOUS

## 2022-09-06 MED ORDER — DEXTROSE 50 % IV SOLN: 0.0000 mL | INTRAVENOUS | Status: DC | PRN

## 2022-09-06 MED ORDER — PROCHLORPERAZINE EDISYLATE 10 MG/2ML IJ SOLN
10.0000 mg | Freq: Once | INTRAMUSCULAR | Status: AC
  Administered 2022-09-06: 10 mg via INTRAVENOUS
  Filled 2022-09-06: qty 2

## 2022-09-06 MED ORDER — ALUM & MAG HYDROXIDE-SIMETH 200-200-20 MG/5ML PO SUSP
30.0000 mL | Freq: Once | ORAL | Status: AC
  Administered 2022-09-06: 30 mL via ORAL
  Filled 2022-09-06: qty 30

## 2022-09-06 MED ORDER — INSULIN ASPART 100 UNIT/ML IJ SOLN
8.0000 [IU] | Freq: Once | INTRAMUSCULAR | Status: AC
  Administered 2022-09-06: 8 [IU] via INTRAVENOUS

## 2022-09-06 MED ORDER — LIDOCAINE VISCOUS HCL 2 % MT SOLN
15.0000 mL | Freq: Once | OROMUCOSAL | Status: AC
  Administered 2022-09-06: 15 mL via ORAL
  Filled 2022-09-06: qty 15

## 2022-09-06 MED ORDER — MAGNESIUM SULFATE 2 GM/50ML IV SOLN
2.0000 g | Freq: Once | INTRAVENOUS | Status: AC
  Administered 2022-09-06: 2 g via INTRAVENOUS
  Filled 2022-09-06: qty 50

## 2022-09-06 MED ORDER — SODIUM CHLORIDE 0.9 % IV SOLN
1000.0000 mL | INTRAVENOUS | Status: DC
  Administered 2022-09-06: 1000 mL via INTRAVENOUS

## 2022-09-06 MED ORDER — INSULIN ASPART 100 UNIT/ML IJ SOLN
7.0000 [IU] | Freq: Once | INTRAMUSCULAR | Status: AC
  Administered 2022-09-06: 7 [IU] via INTRAVENOUS

## 2022-09-06 MED ORDER — SODIUM CHLORIDE 0.9 % IV SOLN: INTRAVENOUS | Status: DC

## 2022-09-06 MED ORDER — ONDANSETRON HCL 4 MG/2ML IJ SOLN
4.0000 mg | Freq: Once | INTRAMUSCULAR | Status: AC
  Administered 2022-09-06: 4 mg via INTRAVENOUS
  Filled 2022-09-06: qty 2

## 2022-09-06 NOTE — ED Provider Notes (Signed)
Patient signed out to me at 0700 pending admission for hyperglycemic crisis starvation ketosis.  In short this is a 37 year old female with a past medical history of diabetes, gastroparesis and chronic pain syndrome that presented with nausea, vomiting and diarrhea.  Workup showed hyperglycemia with an elevated anion gap and BHB, normal pH.  She has been given fluids, started on insulin drip and given pain and nausea control.  Was signed out to the hospitalist for admission.  I was notified by nursing staff that the patient would like to leave [place].  I evaluated the patient at bedside and she states that "she feels better and would like to go home".  I explained to her her concerns with her severe dehydration and hyperglycemia and that she would be at risk for worsening electrolyte derangements that could lead to death. The patient has decided to leave against medical advice.  The patient had a normal mental status examination and understands  her condition and the risks of leaving including worsening electrolyte derangements, altered mental status and death. The patient has had an opportunity to ask  questions about her medical condition. The patient has been informed  that she may return for care at any time and has been referred to his/her  physician.     Kemper Durie, DO 09/06/22 1212

## 2022-09-06 NOTE — ED Notes (Signed)
Pt states that she is feeling much better.  She insists on leaving, EDP spoke with her and she signed herself out AMA, advsed to come back if she felt unwell and to check her CBG and cover herself with home insulin as ordered by her MD.  Pt left ambulatory

## 2022-09-06 NOTE — ED Notes (Signed)
Pt initial K level was over 5 so now runs of K given at this time.  Will continue to monitor.

## 2022-09-06 NOTE — ED Notes (Addendum)
RT obtained VBG on pt with the following results and criticals. Md [place] notified. Pt on RA at time of VBG.    Latest Reference Range & Units 09/06/22 06:55  Sample type  VENOUS  pH, Ven 7.25 - 7.43  7.398  pCO2, Ven 44 - 60 mmHg 44.0  pO2, Ven 32 - 45 mmHg 41  TCO2 22 - 32 mmol/L 29  Acid-Base Excess 0.0 - 2.0 mmol/L 2.0  Bicarbonate 20.0 - 28.0 mmol/L 27.2  O2 Saturation % 76  Patient temperature  98.1 F  Collection site  IV start

## 2022-09-06 NOTE — Inpatient Diabetes Management (Addendum)
Inpatient Diabetes Program Recommendations  AACE/ADA: New Consensus Statement on Inpatient Glycemic Control (2015)  Target Ranges:  Prepandial:   less than 140 mg/dL      Peak postprandial:   less than 180 mg/dL (1-2 hours)      Critically ill patients:  140 - 180 mg/dL   Lab Results  Component Value Date   GLUCAP 247 (H) 09/06/2022   HGBA1C 7.7 (H) 03/09/2022    Review of Glycemic Control  Latest Reference Range & Units 09/06/22 07:23 09/06/22 08:33 09/06/22 09:37 09/06/22 10:30  Glucose-Capillary 70 - 99 mg/dL 402 (H) 341 (H) 262 (H) 247 (H)  (H): Data is abnormally high Diabetes history: Type 1 DM Outpatient Diabetes medications: Levemir 30 units QD, Novolog 15-20 units TID Current orders for Inpatient glycemic control: IV insulin   Inpatient Diabetes Program Recommendations:    In agreement with current plan of care.   Consider adding A1C.   Spoke with patient regarding outpatient diabetes management. Patient states she was continuing to take basal but got into trouble when blood sugars continue to rise and she was vomiting.  Reviewed patient's previous A1c and need for updated result. Explained what a A1c is and what it measures. Also reviewed goal A1c with patient, importance of good glucose control @ home, and blood sugar goals. Reviewed DKA, role of pancreas, survival skill, interventions, sick day rules, importance of covering and correcting even while vomiting if necessary, vascular changes and commorbidities.  Patient wears CGM. Currently, is in need of Novolog and pen needles. Please order at discharge.  -#[id number] pen needles - #[id number] Novolog insulin pen  Patient currently in referral process for outpatient endocrinology and encouraged to see PCP within the next week.  Patient has been given a correction scale to use at home in these situations. Reminded how to best utilize to avoid hospitalization.  No further questions at this time.   Thanks, Bronson Curb, MSN,  [facility] Diabetes Coordinator [phone] (8a-5p)    Thanks, Bronson Curb, MSN, RNC-OB Diabetes Coordinator [phone] (8a-5p)

## 2022-09-06 NOTE — ED Notes (Signed)
RT ran VBG with the following results and criticals. MD Cardama notified of results and criticals. Pt on RA at time of collection.    Latest Reference Range & Units 09/05/22 23:47  Sample type  VENOUS  pH, Ven 7.25 - 7.43  7.619 (HH)  pCO2, Ven 44 - 60 mmHg 22.0 (L)  pO2, Ven 32 - 45 mmHg 43  TCO2 22 - 32 mmol/L 23  Acid-Base Excess 0.0 - 2.0 mmol/L 3.0 (H)  Bicarbonate 20.0 - 28.0 mmol/L 22.6  O2 Saturation % 88  Patient temperature  98.1 F  Collection site  IV start

## 2022-09-06 NOTE — Discharge Instructions (Addendum)
You were seen in the emergency department for your nausea and vomiting.  You had severe hyperglycemia and you appeared severely dehydrated.  And recommended that you be admitted to the hospital for continued fluids, blood sugar control and symptomatic treatment however you chose to leave [place].  You are at risk for worsening dehydration, abnormal electrolytes that could lead to confusion, difficulty breathing and death.  You should follow-up with your primary doctor within the next 24 hours to have your symptoms rechecked.  You may return to the emergency department at any time for reevaluation.

## 2022-09-06 NOTE — ED Notes (Signed)
Post fluid bolus BMET entered per protocol.  Blood drawn

## 2022-09-06 NOTE — ED Notes (Signed)
Pt is reporting worsening chronic foot and abdominal pain.  EDP notified, await further orders

## 2022-09-06 NOTE — ED Notes (Signed)
Pt is requesting to d/c. She states that she feels better

## 2022-09-07 ENCOUNTER — Encounter (HOSPITAL_BASED_OUTPATIENT_CLINIC_OR_DEPARTMENT_OTHER): Payer: Self-pay | Admitting: Emergency Medicine

## 2022-09-07 ENCOUNTER — Other Ambulatory Visit: Payer: Self-pay

## 2022-09-07 ENCOUNTER — Observation Stay (HOSPITAL_COMMUNITY): Payer: BC Managed Care – PPO

## 2022-09-07 ENCOUNTER — Encounter (HOSPITAL_COMMUNITY): Payer: Self-pay

## 2022-09-07 ENCOUNTER — Observation Stay (HOSPITAL_BASED_OUTPATIENT_CLINIC_OR_DEPARTMENT_OTHER)
Admission: EM | Admit: 2022-09-07 | Discharge: 2022-09-08 | Discharge status: Against Medical Advice | Payer: BC Managed Care – PPO | Attending: Internal Medicine | Admitting: Internal Medicine

## 2022-09-07 DIAGNOSIS — Z1152 Encounter for screening for COVID-19: Secondary | ICD-10-CM | POA: Diagnosis not present

## 2022-09-07 DIAGNOSIS — R1110 Vomiting, unspecified: Secondary | ICD-10-CM

## 2022-09-07 DIAGNOSIS — E1040 Type 1 diabetes mellitus with diabetic neuropathy, unspecified: Secondary | ICD-10-CM | POA: Diagnosis not present

## 2022-09-07 DIAGNOSIS — Z794 Long term (current) use of insulin: Secondary | ICD-10-CM | POA: Insufficient documentation

## 2022-09-07 DIAGNOSIS — Z91199 Patient's noncompliance with other medical treatment and regimen due to unspecified reason: Secondary | ICD-10-CM | POA: Diagnosis not present

## 2022-09-07 DIAGNOSIS — E1043 Type 1 diabetes mellitus with diabetic autonomic (poly)neuropathy: Secondary | ICD-10-CM | POA: Insufficient documentation

## 2022-09-07 DIAGNOSIS — R112 Nausea with vomiting, unspecified: Secondary | ICD-10-CM | POA: Diagnosis not present

## 2022-09-07 DIAGNOSIS — E0840 Diabetes mellitus due to underlying condition with diabetic neuropathy, unspecified: Secondary | ICD-10-CM | POA: Diagnosis present

## 2022-09-07 DIAGNOSIS — E1143 Type 2 diabetes mellitus with diabetic autonomic (poly)neuropathy: Secondary | ICD-10-CM | POA: Diagnosis not present

## 2022-09-07 DIAGNOSIS — K3184 Gastroparesis: Secondary | ICD-10-CM | POA: Diagnosis present

## 2022-09-07 DIAGNOSIS — E1065 Type 1 diabetes mellitus with hyperglycemia: Secondary | ICD-10-CM | POA: Diagnosis not present

## 2022-09-07 DIAGNOSIS — Z79899 Other long term (current) drug therapy: Secondary | ICD-10-CM | POA: Diagnosis not present

## 2022-09-07 HISTORY — DX: Patient's noncompliance with other medical treatment and regimen due to unspecified reason: Z91.199

## 2022-09-07 LAB — GLUCOSE, CAPILLARY
Glucose-Capillary: 129 mg/dL — ABNORMAL HIGH (ref 70–99)
Glucose-Capillary: 173 mg/dL — ABNORMAL HIGH (ref 70–99)
Glucose-Capillary: 200 mg/dL — ABNORMAL HIGH (ref 70–99)

## 2022-09-07 LAB — I-STAT VENOUS BLOOD GAS, ED
Acid-Base Excess: 4 mmol/L — ABNORMAL HIGH (ref 0.0–2.0)
Bicarbonate: 29 mmol/L — ABNORMAL HIGH (ref 20.0–28.0)
Calcium, Ion: 1.11 mmol/L — ABNORMAL LOW (ref 1.15–1.40)
HCT: 43 % (ref 36.0–46.0)
Hemoglobin: 14.6 g/dL (ref 12.0–15.0)
O2 Saturation: 34 %
Patient temperature: 98.7
Potassium: 3.9 mmol/L (ref 3.5–5.1)
Sodium: 134 mmol/L — ABNORMAL LOW (ref 135–145)
TCO2: 30 mmol/L (ref 22–32)
pCO2, Ven: 45.9 mmHg (ref 44–60)
pH, Ven: 7.408 (ref 7.25–7.43)
pO2, Ven: 21 mmHg — CL (ref 32–45)

## 2022-09-07 LAB — CBC WITH DIFFERENTIAL/PLATELET
Abs Immature Granulocytes: 0.04 10*3/uL (ref 0.00–0.07)
Abs Immature Granulocytes: 0.02 10*3/uL (ref 0.00–0.07)
Basophils Absolute: 0 10*3/uL (ref 0.0–0.1)
Basophils Absolute: 0 10*3/uL (ref 0.0–0.1)
Basophils Relative: 0 %
Basophils Relative: 0 %
Eosinophils Absolute: 0 10*3/uL (ref 0.0–0.5)
Eosinophils Absolute: 0 10*3/uL (ref 0.0–0.5)
Eosinophils Relative: 0 %
Eosinophils Relative: 0 %
HCT: 39.2 % (ref 36.0–46.0)
HCT: 38.4 % (ref 36.0–46.0)
Hemoglobin: 13.3 g/dL (ref 12.0–15.0)
Hemoglobin: 12.9 g/dL (ref 12.0–15.0)
Immature Granulocytes: 0 %
Immature Granulocytes: 0 %
Lymphocytes Relative: 16 %
Lymphocytes Relative: 22 %
Lymphs Abs: 1.9 10*3/uL (ref 0.7–4.0)
Lymphs Abs: 2.3 10*3/uL (ref 0.7–4.0)
MCH: 30.4 pg (ref 26.0–34.0)
MCH: 30.5 pg (ref 26.0–34.0)
MCHC: 33.9 g/dL (ref 30.0–36.0)
MCHC: 33.6 g/dL (ref 30.0–36.0)
MCV: 89.5 fL (ref 80.0–100.0)
MCV: 90.8 fL (ref 80.0–100.0)
Monocytes Absolute: 0.6 10*3/uL (ref 0.1–1.0)
Monocytes Absolute: 0.7 10*3/uL (ref 0.1–1.0)
Monocytes Relative: 5 %
Monocytes Relative: 7 %
Neutro Abs: 9.4 10*3/uL — ABNORMAL HIGH (ref 1.7–7.7)
Neutro Abs: 7.2 10*3/uL (ref 1.7–7.7)
Neutrophils Relative %: 79 %
Neutrophils Relative %: 71 %
Platelets: 325 10*3/uL (ref 150–400)
Platelets: 320 10*3/uL (ref 150–400)
RBC: 4.38 MIL/uL (ref 3.87–5.11)
RBC: 4.23 MIL/uL (ref 3.87–5.11)
RDW: 13.4 % (ref 11.5–15.5)
RDW: 13.4 % (ref 11.5–15.5)
WBC: 12 10*3/uL — ABNORMAL HIGH (ref 4.0–10.5)
WBC: 10.3 10*3/uL (ref 4.0–10.5)
nRBC: 0 % (ref 0.0–0.2)
nRBC: 0 % (ref 0.0–0.2)

## 2022-09-07 LAB — LACTIC ACID, PLASMA
Lactic Acid, Venous: 0.8 mmol/L (ref 0.5–1.9)
Lactic Acid, Venous: 0.7 mmol/L (ref 0.5–1.9)

## 2022-09-07 LAB — RAPID URINE DRUG SCREEN, HOSP PERFORMED
Amphetamines: NOT DETECTED
Barbiturates: NOT DETECTED
Benzodiazepines: NOT DETECTED
Cocaine: NOT DETECTED
Opiates: NOT DETECTED
Tetrahydrocannabinol: POSITIVE — AB

## 2022-09-07 LAB — COMPREHENSIVE METABOLIC PANEL
ALT: 17 U/L (ref 0–44)
AST: 15 U/L (ref 15–41)
Albumin: 3.6 g/dL (ref 3.5–5.0)
Alkaline Phosphatase: 82 U/L (ref 38–126)
Anion gap: 8 (ref 5–15)
BUN: 14 mg/dL (ref 6–20)
CO2: 28 mmol/L (ref 22–32)
Calcium: 8.6 mg/dL — ABNORMAL LOW (ref 8.9–10.3)
Chloride: 99 mmol/L (ref 98–111)
Creatinine, Ser: 0.7 mg/dL (ref 0.44–1.00)
GFR, Estimated: >60 mL/min (ref >60)
Glucose, Bld: 129 mg/dL — ABNORMAL HIGH (ref 70–99)
Potassium: 3.3 mmol/L — ABNORMAL LOW (ref 3.5–5.1)
Sodium: 135 mmol/L (ref 135–145)
Total Bilirubin: 0.5 mg/dL (ref 0.3–1.2)
Total Protein: 7.4 g/dL (ref 6.5–8.1)

## 2022-09-07 LAB — BASIC METABOLIC PANEL
Anion gap: 11 (ref 5–15)
BUN: 18 mg/dL (ref 6–20)
CO2: 28 mmol/L (ref 22–32)
Calcium: 9.5 mg/dL (ref 8.9–10.3)
Chloride: 94 mmol/L — ABNORMAL LOW (ref 98–111)
Creatinine, Ser: 0.84 mg/dL (ref 0.44–1.00)
GFR, Estimated: >60 mL/min (ref >60)
Glucose, Bld: 234 mg/dL — ABNORMAL HIGH (ref 70–99)
Potassium: 3.8 mmol/L (ref 3.5–5.1)
Sodium: 133 mmol/L — ABNORMAL LOW (ref 135–145)

## 2022-09-07 LAB — CREATININE, SERUM
Creatinine, Ser: 0.75 mg/dL (ref 0.44–1.00)
GFR, Estimated: >60 mL/min (ref >60)

## 2022-09-07 LAB — RESP PANEL BY RT-PCR (RSV, FLU A&B, COVID)  RVPGX2
Influenza A by PCR: NEGATIVE
Influenza B by PCR: NEGATIVE
Resp Syncytial Virus by PCR: NEGATIVE
SARS Coronavirus 2 by RT PCR: NEGATIVE

## 2022-09-07 LAB — CBG MONITORING, ED
Glucose-Capillary: 240 mg/dL — ABNORMAL HIGH (ref 70–99)
Glucose-Capillary: 219 mg/dL — ABNORMAL HIGH (ref 70–99)
Glucose-Capillary: 147 mg/dL — ABNORMAL HIGH (ref 70–99)

## 2022-09-07 LAB — PHOSPHORUS: Phosphorus: 1.7 mg/dL — ABNORMAL LOW (ref 2.5–4.6)

## 2022-09-07 LAB — TSH: TSH: 1.426 u[IU]/mL (ref 0.350–4.500)

## 2022-09-07 LAB — HEMOGLOBIN A1C
Hgb A1c MFr Bld: 10.9 % — ABNORMAL HIGH (ref 4.8–5.6)
Mean Plasma Glucose: 266.13 mg/dL

## 2022-09-07 LAB — HCG, QUANTITATIVE, PREGNANCY: hCG, Beta Chain, Quant, S: <1 m[IU]/mL (ref <5)

## 2022-09-07 LAB — BETA-HYDROXYBUTYRIC ACID: Beta-Hydroxybutyric Acid: 0.75 mmol/L — ABNORMAL HIGH (ref 0.05–0.27)

## 2022-09-07 LAB — MAGNESIUM: Magnesium: 2.2 mg/dL (ref 1.7–2.4)

## 2022-09-07 MED ORDER — ONDANSETRON HCL 4 MG PO TABS: 4.0000 mg | ORAL_TABLET | Freq: Four times a day (QID) | ORAL | Status: DC | PRN

## 2022-09-07 MED ORDER — INSULIN ASPART 100 UNIT/ML IJ SOLN
5.0000 [IU] | Freq: Once | INTRAMUSCULAR | Status: AC
  Administered 2022-09-07: 5 [IU] via SUBCUTANEOUS

## 2022-09-07 MED ORDER — PROCHLORPERAZINE EDISYLATE 10 MG/2ML IJ SOLN
10.0000 mg | Freq: Once | INTRAMUSCULAR | Status: AC
  Administered 2022-09-07: 10 mg via INTRAVENOUS
  Filled 2022-09-07: qty 2

## 2022-09-07 MED ORDER — INSULIN DETEMIR 100 UNIT/ML ~~LOC~~ SOLN
20.0000 [IU] | Freq: Every day | SUBCUTANEOUS | Status: DC
  Administered 2022-09-07: 20 [IU] via SUBCUTANEOUS
  Filled 2022-09-07: qty 0.2

## 2022-09-07 MED ORDER — ONDANSETRON HCL 4 MG/2ML IJ SOLN
4.0000 mg | Freq: Four times a day (QID) | INTRAMUSCULAR | Status: DC | PRN
  Administered 2022-09-07 – 2022-09-08 (×2): 4 mg via INTRAVENOUS
  Filled 2022-09-07 (×2): qty 2

## 2022-09-07 MED ORDER — FOLIC ACID 5 MG/ML IJ SOLN
1.0000 mg | Freq: Every day | INTRAMUSCULAR | Status: DC
  Administered 2022-09-07: 1 mg via INTRAVENOUS
  Filled 2022-09-07 (×2): qty 0.2

## 2022-09-07 MED ORDER — ONDANSETRON HCL 4 MG/2ML IJ SOLN
4.0000 mg | Freq: Once | INTRAMUSCULAR | Status: AC
  Administered 2022-09-07: 4 mg via INTRAVENOUS
  Filled 2022-09-07: qty 2

## 2022-09-07 MED ORDER — CHLORHEXIDINE GLUCONATE CLOTH 2 % EX PADS: 6.0000 | MEDICATED_PAD | Freq: Every day | CUTANEOUS | Status: DC

## 2022-09-07 MED ORDER — HALOPERIDOL LACTATE 5 MG/ML IJ SOLN
2.0000 mg | Freq: Once | INTRAMUSCULAR | Status: AC
  Administered 2022-09-07: 2 mg via INTRAVENOUS
  Filled 2022-09-07: qty 1

## 2022-09-07 MED ORDER — INSULIN ASPART 100 UNIT/ML IJ SOLN
0.0000 [IU] | INTRAMUSCULAR | Status: DC
  Administered 2022-09-07: 2 [IU] via SUBCUTANEOUS
  Administered 2022-09-07 – 2022-09-08 (×2): 3 [IU] via SUBCUTANEOUS

## 2022-09-07 MED ORDER — ACETAMINOPHEN 650 MG RE SUPP: 650.0000 mg | Freq: Four times a day (QID) | RECTAL | Status: DC | PRN

## 2022-09-07 MED ORDER — MORPHINE SULFATE (PF) 2 MG/ML IV SOLN
1.0000 mg | Freq: Four times a day (QID) | INTRAVENOUS | Status: DC | PRN
  Administered 2022-09-07 – 2022-09-08 (×2): 1 mg via INTRAVENOUS
  Filled 2022-09-07 (×2): qty 1

## 2022-09-07 MED ORDER — PANTOPRAZOLE SODIUM 40 MG IV SOLR: 40.0000 mg | Freq: Every day | INTRAVENOUS | Status: DC

## 2022-09-07 MED ORDER — THIAMINE HCL 100 MG/ML IJ SOLN
100.0000 mg | Freq: Every day | INTRAMUSCULAR | Status: DC
  Administered 2022-09-07: 100 mg via INTRAVENOUS
  Filled 2022-09-07: qty 2

## 2022-09-07 MED ORDER — ORAL CARE MOUTH RINSE: 15.0000 mL | OROMUCOSAL | Status: DC | PRN

## 2022-09-07 MED ORDER — ASPIRIN 81 MG PO TBEC
81.0000 mg | DELAYED_RELEASE_TABLET | Freq: Every day | ORAL | Status: DC
  Filled 2022-09-07: qty 1

## 2022-09-07 MED ORDER — ENOXAPARIN SODIUM 40 MG/0.4ML IJ SOSY
40.0000 mg | PREFILLED_SYRINGE | INTRAMUSCULAR | Status: DC
  Administered 2022-09-07: 40 mg via SUBCUTANEOUS
  Filled 2022-09-07: qty 0.4

## 2022-09-07 MED ORDER — SODIUM CHLORIDE 0.9 % IV BOLUS
1000.0000 mL | Freq: Once | INTRAVENOUS | Status: AC
  Administered 2022-09-07: 1000 mL via INTRAVENOUS

## 2022-09-07 MED ORDER — MORPHINE SULFATE (PF) 2 MG/ML IV SOLN: 1.0000 mg | INTRAVENOUS | Status: DC | PRN

## 2022-09-07 MED ORDER — PANTOPRAZOLE SODIUM 40 MG IV SOLR
40.0000 mg | Freq: Once | INTRAVENOUS | Status: AC
  Administered 2022-09-07: 40 mg via INTRAVENOUS
  Filled 2022-09-07: qty 10

## 2022-09-07 MED ORDER — SODIUM CHLORIDE 0.9 % IV SOLN: INTRAVENOUS | Status: DC

## 2022-09-07 MED ORDER — ACETAMINOPHEN 325 MG PO TABS: 650.0000 mg | ORAL_TABLET | Freq: Four times a day (QID) | ORAL | Status: DC | PRN

## 2022-09-07 MED ORDER — METOCLOPRAMIDE HCL 5 MG/ML IJ SOLN
5.0000 mg | Freq: Three times a day (TID) | INTRAMUSCULAR | Status: DC
  Administered 2022-09-08: 5 mg via INTRAVENOUS
  Filled 2022-09-07: qty 2

## 2022-09-07 NOTE — ED Notes (Signed)
ED Provider at bedside addressing nausea and pain complaint

## 2022-09-07 NOTE — H&P (Signed)
[facility] History and Physical  Revecca Nachtigal DZH:[id number] DOB: 04/15/1986 DOA: 09/07/2022  Referring physician:  PCP: Jeanie Sewer, NP   Chief Complaint: DM gastroparesis  HPI: [name] is a 37 y.o. BF PMHx DM type 1 diabetes uncontrolled with hyperglycemia, DM neuropathy, DM Gastroparesis and chronic pain syndrome, noncompliance with medical treatment,  Presents to the [facility] emergency department for 2 days of nausea, vomiting with 1 day of loose diarrhea.  Patient has been feeling weak and unable to check her sugars and take her medications.  She is endorsing epigastric and left upper quadrant abdominal pain.  She denies any fevers or cough.  No chest pain or shortness of breath.  No urinary symptoms.  She denies any alcohol or illicit drug use.     Review of Systems:  Covid vaccination;  Constitutional:  No weight loss, night sweats, Fevers, chills, fatigue.  HEENT:  No headaches, Difficulty swallowing,Tooth/dental problems,Sore throat,  No sneezing, itching, ear ache, nasal congestion, post nasal drip,  Cardio-vascular:  No chest pain, Orthopnea, PND, swelling in lower extremities, anasarca, dizziness, palpitations  GI:  No heartburn, indigestion, positive abdominal pain, nausea, vomiting, negative diarrhea, change in bowel habits, positive loss of appetite  Resp:  positive shortness of breath with exertion or at rest. No excess mucus, no productive cough, No non-productive cough, No coughing up of blood.No change in color of mucus.No wheezing.No chest wall deformity  Skin:  no rash or lesions.  GU:  no dysuria, change in color of urine, no urgency or frequency. No flank pain.  Musculoskeletal:  No joint pain or swelling. No decreased range of motion. No back pain.  Psych:  No change in mood or affect. No depression or anxiety. No memory loss.   Past Medical History:  Diagnosis Date   Chronic constipation 11/08/2021    Diabetic neuropathy ([place])    History of chicken pox    History of noncompliance with medical treatment 09/07/2022   Type 1 diabetes ([place])    Vaginal odor 11/08/2021   Past Surgical History:  Procedure Laterality Date   CESAREAN SECTION     only one C/S per pt.   CO2 LASER APPLICATION N/A 11/17/9620   Procedure: CO2 LASER APPLICATION TO VULVA;  Surgeon: Lafonda Mosses, MD;  Location: [facility];  Service: General;  Laterality: N/A;   DILATION AND CURETTAGE OF UTERUS     EXCISION OF SKIN TAG N/A 02/22/2022   Procedure: EXCISION OF ANAL SKIN TAGS;  Surgeon: Leighton Ruff, MD;  Location: [place] [facility];  Service: General;  Laterality: N/A;   Social History:  reports that she has never smoked. She has never been exposed to tobacco smoke. She has never used smokeless tobacco. She reports that she does not drink alcohol and does not use drugs.  Allergies  Allergen Reactions   Pramipexole Shortness Of Breath    Family History  Problem Relation Age of Onset   Hypertension Mother    Glaucoma Mother    Diabetes Maternal Grandmother    Neuropathy Neg Hx    Colon cancer Neg Hx    Stomach cancer Neg Hx    Esophageal cancer Neg Hx    Colon polyps Neg Hx    Rectal cancer Neg Hx     Prior to Admission medications   Medication Sig Start Date End Date Taking? Authorizing Provider  amitriptyline (ELAVIL) 25 MG tablet Take 25-75 mg by mouth at bedtime. 06/26/22   [provider]  Continuous Blood Gluc Transmit (DEXCOM G6 TRANSMITTER) MISC Inject 1 each into the skin every 14 (fourteen) days. 12/31/21   Jeanie Sewer, NP  cyanocobalamin (VITAMIN B12) 1000 MCG/ML injection Inject into the muscle. 08/31/22   [provider]  DULoxetine (CYMBALTA) 60 MG capsule Take 60 mg by mouth daily. 06/28/22   [provider]  gabapentin (NEURONTIN) 400 MG capsule Take 1 capsule (400 mg total) by mouth 3 (three) times daily. Patient not taking: Reported  on 05/07/2022 03/10/22   Kathie Dike, MD  hydrOXYzine (ATARAX) 25 MG tablet Take 1 tablet (25 mg total) by mouth at bedtime as needed (insomnia). 06/03/22   Truddie Hidden, MD  insulin aspart (NOVOLOG FLEXPEN) 100 UNIT/ML FlexPen Inject 15-20 Units into the skin 3 (three) times daily with meals. plus sliding scale 05/10/22 06/09/22  Geradine Girt, DO  insulin detemir (LEVEMIR FLEXTOUCH) 100 UNIT/ML FlexPen 30 units daily-- will need to adjust as you intake at home varies 05/10/22   Eulogio Bear U, DO  Insulin Pen Needle (B-D UF III MINI PEN NEEDLES) 31G X 5 MM MISC To use with Insulin pens, as directed 11/20/21   Jeanie Sewer, NP  Insulin Syringes, Disposable, U-100 1 ML MISC Inject 1 each into the skin in the morning and at bedtime. 08/10/21   Jeanie Sewer, NP  metoCLOPramide (REGLAN) 5 MG tablet Take 1 tablet (5 mg total) by mouth 3 (three) times daily before meals. 05/10/22 05/10/23  Geradine Girt, DO  ondansetron (ZOFRAN) 4 MG tablet Take 1 tablet (4 mg total) by mouth every 8 (eight) hours as needed for nausea or vomiting. 03/10/22   Kathie Dike, MD  Oxycodone HCl 10 MG TABS Take 10 mg by mouth 3 (three) times daily as needed. 08/07/22   [provider]  pantoprazole (PROTONIX) 40 MG tablet Take 1 tablet (40 mg total) by mouth daily. 05/10/22   Geradine Girt, DO  rosuvastatin (CRESTOR) 10 MG tablet Take 10 mg by mouth daily. 07/04/22   [provider]  topiramate (TOPAMAX) 50 MG tablet Take by mouth. 04/29/22   [provider]  traZODone (DESYREL) 100 MG tablet Take 100 mg by mouth at bedtime. 03/18/22   [provider]     Consultants:    Procedures/Significant Events:    I have personally reviewed and interpreted all radiology studies and my findings are as above.   VENTILATOR SETTINGS:    Cultures   Antimicrobials:    Devices    LINES / TUBES:      Continuous Infusions:  sodium chloride 100 mL/hr at 09/07/22  1829    Physical Exam: Vitals:   09/07/22 1230 09/07/22 1529 09/07/22 1617 09/07/22 1949  BP: (!) 159/90  (!) 153/95 (!) 138/95  Pulse: (!) 105  (!) 105 (!) 105  Resp: 16  16   Temp:  98.6 F (37 C) 99.3 F (37.4 C)   TempSrc:  Oral Oral   SpO2: 97%  100% 100%    Wt Readings from Last 3 Encounters:  09/05/22 70.8 kg  06/02/22 70.8 kg  05/07/22 74.8 kg    General: A/O x 4, No acute respiratory distress Eyes: negative scleral hemorrhage, negative anisocoria, negative icterus ENT: Negative Runny nose, negative gingival bleeding, Neck:  Negative scars, masses, torticollis, lymphadenopathy, JVD Lungs: Clear to auscultation bilaterally without wheezes or crackles Cardiovascular: Regular rate and rhythm without murmur gallop or rub normal S1 and S2 Abdomen: Positive generalized abdominal pain with palpation, nondistended, negative  soft, bowel sounds, no rebound, no ascites, no appreciable mass Extremities: No significant cyanosis, clubbing, or edema bilateral lower extremities Skin: Negative rashes, lesions, ulcers Psychiatric:  Negative depression, negative anxiety, negative fatigue, negative mania  Central nervous system:  Cranial nerves II through XII intact, tongue/uvula midline, all extremities muscle strength 5/5, sensation intact throughout, negative dysarthria, negative expressive aphasia, negative receptive aphasia.        Labs on Admission:  Basic Metabolic Panel: Recent Labs  Lab 09/05/22 2330 09/05/22 2347 09/06/22 0733 09/06/22 1141 09/07/22 0815 09/07/22 0817 09/07/22 1717  NA 128*   < > 135 137 133* 134* 135  K 5.1   < > 4.3 3.6 3.8 3.9 3.3*  CL 88*  --  94* 100 94*  --  99  CO2 23  --  '23 28 28  '$ --  28  GLUCOSE 529*  --  419* 216* 234*  --  129*  BUN 17  --  '18 18 18  '$ --  14  CREATININE 0.91  --  0.87 0.80 0.84  --  0.70  CALCIUM 10.4*  --  9.4 9.6 9.5  --  8.6*  MG  --   --   --   --   --   --  2.2  PHOS  --   --   --   --   --   --  1.7*   < > =  values in this interval not displayed.   Liver Function Tests: Recent Labs  Lab 09/05/22 2330 09/07/22 1717  AST 22 15  ALT 25 17  ALKPHOS 103 82  BILITOT 0.6 0.5  PROT 8.3* 7.4  ALBUMIN 4.6 3.6   Recent Labs  Lab 09/05/22 2330  LIPASE <10*   No results for input(s): "AMMONIA" in the last 168 hours. CBC: Recent Labs  Lab 09/05/22 2330 09/05/22 2347 09/06/22 0655 09/07/22 0815 09/07/22 0817 09/07/22 1717  WBC 15.1*  --   --  12.0*  --  10.3  NEUTROABS 12.5*  --   --  9.4*  --  7.2  HGB 13.0 13.3 12.9 13.3 14.6 12.9  HCT 37.4 39.0 38.0 39.2 43.0 38.4  MCV 86.6  --   --  89.5  --  90.8  PLT 363  --   --  325  --  320   Cardiac Enzymes: No results for input(s): "CKTOTAL", "CKMB", "CKMBINDEX", "TROPONINI" in the last 168 hours.  BNP (last 3 results) No results for input(s): "BNP" in the last 8760 hours.  ProBNP (last 3 results) No results for input(s): "PROBNP" in the last 8760 hours.  CBG: Recent Labs  Lab 09/06/22 1139 09/07/22 0839 09/07/22 1015 09/07/22 1513 09/07/22 1610  GLUCAP 206* 240* 219* 147* 129*    Radiological Exams on Admission: No results found.  EKG: Independently reviewed.  Sinus tachycardia on EKG from 1/19  Assessment/Plan Principal Problem:   Intractable nausea and vomiting Active Problems:   Diabetic gastroparesis ([place])   Uncontrolled type 1 diabetes mellitus with hyperglycemia, with long-term current use of insulin ([facility])   Diabetes mellitus due to underlying condition with diabetic neuropathy ([place])   History of noncompliance with medical treatment  DM type I uncontrolled with hyperglycemia - Patient claims became ill not feeling well unable to take her medication.  Generalized symptoms of abdominal pain, SOB. -Normal saline 182m/hr -hCG pending - Blood, urine, and respiratory culture pending - Levemir 20 units qhs (home dose) - Moderate SSI - Reglan 5 mg TID -  Zofran 4 mg QID PRN -  DM gastroparesis/intractable nausea  and vomiting - See DM type I uncontrolled  DM neuropathy - See DM type I uncontrolled  Hx noncompliance with medical treatment - See DM type I uncontrolled       There is no height or weight on file to calculate BMI.  Mobility Assessment (last 72 hours)     Mobility Assessment     Row Name 09/07/22 1800           Does patient have an order for bedrest or is patient medically unstable No - Continue assessment       What is the highest level of mobility based on the progressive mobility assessment? Level 6 (Walks independently in room and hall) - Balance while walking in room without assist - Complete                Code Status: Full (DVT Prophylaxis: Lovenox Family Communication:   Status is: Inpatient    Dispo: The patient is from: Home              Anticipated d/c is to: Home              Anticipated d/c date is: 3 days              Patient currently is not medically stable to d/c.     Data Reviewed: Care during the described time interval was provided by me .  I have reviewed this patient's available data, including medical history, events of note, physical examination, and all test results as part of my evaluation.   The patient is critically ill with multiple organ systems failure and requires high complexity decision making for assessment and support, frequent evaluation and titration of therapies, application of advanced monitoring technologies and extensive interpretation of multiple databases. Critical Care Time devoted to patient care services described in this note  Time spent: 70 minutes   Sima Lindenberger, [facility]

## 2022-09-07 NOTE — Progress Notes (Signed)
Rapid Response Event Note   Reason for Call : AMS   Initial Focused Assessment: found pt sitting up in bed, c/o of a HA and quickly going unresponsive (sternal rub ineffective) as to HR and RR not dropping.  See flowsheet for VS.  Primary RN reported pt to have these episodes.  Witnessed pt go seemingly unresponsive and awake startled after about a minute.   CBG 178.   Pt has N&V.    Interventions:  [facility], NP at the bedside.  Head CT stat.   Plan of Care: Transfer to [facility] for closer monitoring per [facility], NP.    Event Summary:   MD Notified: yes Call Time: 1943 Arrival Time: 1953 End Time: 2200  Dyann Ruddle, RN

## 2022-09-07 NOTE — ED Notes (Signed)
Kim at [facility] will send transport to [facility](NS)

## 2022-09-07 NOTE — ED Notes (Signed)
CBG results given to Etta Quill, RN

## 2022-09-07 NOTE — ED Provider Notes (Signed)
[facility] Provider Note   CSN: [id number] Arrival date & time: 09/07/22  2025     History  Chief Complaint  Patient presents with   Weakness    [name] is a 37 y.o. female.  Patient is a 37 year old female with a history of diabetes, insulin-dependent, gastroparesis and chronic pain syndrome.  She was seen here yesterday and plan was for admission for diabetic ketoacidosis versus hyperglycemia with starvation ketosis.  She did have an anion gap but did not appear to be significantly acidotic.  However following plans for admission, patient was feeling better and left AMA.  She said that once home, she started having nausea and vomiting again and has had ongoing vomiting and is not feeling any better.  No fevers.  She has some pain across her upper abdomen.  Her emesis is mostly nonbilious and nonbloody but she does have occasional small amounts of blood in her emesis.  No ongoing diarrhea.  No cough or cold symptoms.  She said she checked her blood glucose early this morning at 430 and it was around 300.       Home Medications Prior to Admission medications   Medication Sig Start Date End Date Taking? Authorizing Provider  amitriptyline (ELAVIL) 25 MG tablet Take 25-75 mg by mouth at bedtime. 06/26/22   [provider]  Continuous Blood Gluc Transmit (DEXCOM G6 TRANSMITTER) MISC Inject 1 each into the skin every 14 (fourteen) days. 12/31/21   Jeanie Sewer, NP  cyanocobalamin (VITAMIN B12) 1000 MCG/ML injection Inject into the muscle. 08/31/22   [provider]  DULoxetine (CYMBALTA) 60 MG capsule Take 60 mg by mouth daily. 06/28/22   [provider]  gabapentin (NEURONTIN) 400 MG capsule Take 1 capsule (400 mg total) by mouth 3 (three) times daily. Patient not taking: Reported on 05/07/2022 03/10/22   Kathie Dike, MD  hydrOXYzine (ATARAX) 25 MG tablet Take 1 tablet (25 mg total) by mouth at  bedtime as needed (insomnia). 06/03/22   Truddie Hidden, MD  insulin aspart (NOVOLOG FLEXPEN) 100 UNIT/ML FlexPen Inject 15-20 Units into the skin 3 (three) times daily with meals. plus sliding scale 05/10/22 06/09/22  Geradine Girt, DO  insulin detemir (LEVEMIR FLEXTOUCH) 100 UNIT/ML FlexPen 30 units daily-- will need to adjust as you intake at home varies 05/10/22   Eulogio Bear U, DO  Insulin Pen Needle (B-D UF III MINI PEN NEEDLES) 31G X 5 MM MISC To use with Insulin pens, as directed 11/20/21   Jeanie Sewer, NP  Insulin Syringes, Disposable, U-100 1 ML MISC Inject 1 each into the skin in the morning and at bedtime. 08/10/21   Jeanie Sewer, NP  metoCLOPramide (REGLAN) 5 MG tablet Take 1 tablet (5 mg total) by mouth 3 (three) times daily before meals. 05/10/22 05/10/23  Geradine Girt, DO  ondansetron (ZOFRAN) 4 MG tablet Take 1 tablet (4 mg total) by mouth every 8 (eight) hours as needed for nausea or vomiting. 03/10/22   Kathie Dike, MD  Oxycodone HCl 10 MG TABS Take 10 mg by mouth 3 (three) times daily as needed. 08/07/22   [provider]  pantoprazole (PROTONIX) 40 MG tablet Take 1 tablet (40 mg total) by mouth daily. 05/10/22   Geradine Girt, DO  rosuvastatin (CRESTOR) 10 MG tablet Take 10 mg by mouth daily. 07/04/22   [provider]  topiramate (TOPAMAX) 50 MG tablet Take by mouth. 04/29/22   [provider]  traZODone (DESYREL) 100 MG tablet Take 100 mg by mouth at bedtime. 03/18/22   [provider]      Allergies    Pramipexole    Review of Systems   Review of Systems  Constitutional:  Positive for fatigue. Negative for chills, diaphoresis and fever.  HENT:  Negative for congestion, rhinorrhea and sneezing.   Eyes: Negative.   Respiratory:  Negative for cough, chest tightness and shortness of breath.   Cardiovascular:  Negative for chest pain and leg swelling.  Gastrointestinal:  Positive for abdominal pain, nausea and  vomiting. Negative for blood in stool and diarrhea.  Genitourinary:  Negative for difficulty urinating, flank pain, frequency and hematuria.  Musculoskeletal:  Negative for arthralgias and back pain.  Skin:  Negative for rash.  Neurological:  Negative for dizziness, speech difficulty, weakness, numbness and headaches.    Physical Exam Updated Vital Signs BP (!) 157/95   Pulse (!) 102   Temp 98.7 F (37.1 C) (Oral)   Resp 20   LMP 08/20/2022   SpO2 100%  Physical Exam Constitutional:      Appearance: She is well-developed.  HENT:     Head: Normocephalic and atraumatic.     Mouth/Throat:     Comments: Slightly dry mucous membranes Eyes:     Pupils: Pupils are equal, round, and reactive to light.  Cardiovascular:     Rate and Rhythm: Regular rhythm. Tachycardia present.     Heart sounds: Normal heart sounds.  Pulmonary:     Effort: Pulmonary effort is normal. No respiratory distress.     Breath sounds: Normal breath sounds. No wheezing or rales.  Chest:     Chest wall: No tenderness.  Abdominal:     General: Bowel sounds are normal.     Palpations: Abdomen is soft.     Tenderness: There is abdominal tenderness (Tenderness across the upper abdomen). There is no guarding or rebound.  Musculoskeletal:        General: Normal range of motion.     Cervical back: Normal range of motion and neck supple.  Lymphadenopathy:     Cervical: No cervical adenopathy.  Skin:    General: Skin is warm and dry.     Findings: No rash.  Neurological:     Mental Status: She is alert and oriented to person, place, and time.     ED Results / Procedures / Treatments   Labs (all labs ordered are listed, but only abnormal results are displayed) Labs Reviewed  BASIC METABOLIC PANEL - Abnormal; Notable for the following components:      Result Value   Sodium 133 (*)    Chloride 94 (*)    Glucose, Bld 234 (*)    All other components within normal limits  CBC WITH DIFFERENTIAL/PLATELET -  Abnormal; Notable for the following components:   WBC 12.0 (*)    Neutro Abs 9.4 (*)    All other components within normal limits  I-STAT VENOUS BLOOD GAS, ED - Abnormal; Notable for the following components:   pO2, Ven 21 (*)    Bicarbonate 29.0 (*)    Acid-Base Excess 4.0 (*)    Sodium 134 (*)    Calcium, Ion 1.11 (*)    All other components within normal limits  CBG MONITORING, ED - Abnormal; Notable for the following components:   Glucose-Capillary 240 (*)    All other components within normal limits    EKG None  Radiology No results found.  Procedures  Procedures    Medications Ordered in ED Medications  insulin aspart (novoLOG) injection 5 Units (has no administration in time range)  sodium chloride 0.9 % bolus 1,000 mL ( Intravenous Stopped 09/07/22 0942)  ondansetron (ZOFRAN) injection 4 mg (4 mg Intravenous Given 09/07/22 0837)  pantoprazole (PROTONIX) injection 40 mg (40 mg Intravenous Given 09/07/22 0837)  prochlorperazine (COMPAZINE) injection 10 mg (10 mg Intravenous Given 09/07/22 7262)    ED Course/ Medical Decision Making/ A&P                             Medical Decision Making Amount and/or Complexity of Data Reviewed Labs: ordered.  Risk Prescription drug management. Decision regarding hospitalization.   Patient is a 37 year old female who has a history of insulin-dependent diabetes, gastroparesis and chronic pain syndrome.  She was here yesterday and plan was admission for possible DKA versus hyperglycemia with starvation ketosis.  She started feeling better and left AMA while waiting for bed assignment.  She presents today with ongoing nausea and vomiting.  She has her typical upper abdominal pain.  I do not feel that imaging is indicated currently given that this is similar to prior presentations.  Her labs were actually looking better this morning.  She does not have evidence of acidosis.  Her glucose is elevated but no signs of DKA.  No anion gap.   However she does have ongoing nausea despite treatment in the ED.  She request hospitalization for treatment of this and I feel that this is reasonable.  She was also given Protonix in addition to the antiemetics.  Spoke with Dr. Marylyn Ishihara with the hospitalist service to admit the patient for further treatment.  She takes NovoLog in the morning between 7 to 10 units and Levemir at night.  I will go ahead and give her 5 units of insulin this morning.  She was also given IV fluids.  Will adjust as needed.  Final Clinical Impression(s) / ED Diagnoses Final diagnoses:  Intractable vomiting    Rx / DC Orders ED Discharge Orders     None         Malvin Johns, MD 09/07/22 1013

## 2022-09-07 NOTE — ED Triage Notes (Signed)
Cbg 300 at 0430 and took 10units insulin.. pt states she started feeling worse after she left yesterday, n/v/weakness

## 2022-09-07 NOTE — Progress Notes (Signed)
Plan of Care Note for accepted transfer   Patient: [name] MRN: [id number]   DOA: 09/07/2022  Facility requesting transfer: [facility] Requesting Provider: Dr. Tamera Punt Reason for transfer: N/V Facility course: 37 yo F w/ PMHx of DM1, gastroparesis, neuropathy, HLD. Presenting with N/V. W/u revealed hyperglycemia w/o an anion gap or acidosis. She was started on fluids, protonix, anti-emetics. I have requested that the EDP start SQ insulin as well.   Plan of care: The patient is accepted for admission to Telemetry unit, at [facility]..  While holding at [facility], medical decision making responsibilities remain with the EDP. Upon arrival to [facility], [facility] will assume care. Thank you.   Author: Jonnie Finner, DO 09/07/2022  Check [email or web address] for on-call coverage.  Nursing staff, Please call [place] number on Amion as soon as patient's arrival, so appropriate admitting provider can evaluate the pt.

## 2022-09-07 NOTE — ED Notes (Signed)
Anderson Malta RN to call back for report from [place]

## 2022-09-08 DIAGNOSIS — E0840 Diabetes mellitus due to underlying condition with diabetic neuropathy, unspecified: Secondary | ICD-10-CM

## 2022-09-08 DIAGNOSIS — Z91199 Patient's noncompliance with other medical treatment and regimen due to unspecified reason: Secondary | ICD-10-CM

## 2022-09-08 DIAGNOSIS — Z794 Long term (current) use of insulin: Secondary | ICD-10-CM

## 2022-09-08 DIAGNOSIS — K3184 Gastroparesis: Secondary | ICD-10-CM

## 2022-09-08 DIAGNOSIS — E1065 Type 1 diabetes mellitus with hyperglycemia: Secondary | ICD-10-CM

## 2022-09-08 DIAGNOSIS — E1143 Type 2 diabetes mellitus with diabetic autonomic (poly)neuropathy: Secondary | ICD-10-CM

## 2022-09-08 LAB — COMPREHENSIVE METABOLIC PANEL
ALT: 16 U/L (ref 0–44)
AST: 16 U/L (ref 15–41)
Albumin: 3.6 g/dL (ref 3.5–5.0)
Alkaline Phosphatase: 79 U/L (ref 38–126)
Anion gap: 9 (ref 5–15)
BUN: 12 mg/dL (ref 6–20)
CO2: 24 mmol/L (ref 22–32)
Calcium: 8.2 mg/dL — ABNORMAL LOW (ref 8.9–10.3)
Chloride: 101 mmol/L (ref 98–111)
Creatinine, Ser: 0.71 mg/dL (ref 0.44–1.00)
GFR, Estimated: >60 mL/min (ref >60)
Glucose, Bld: 187 mg/dL — ABNORMAL HIGH (ref 70–99)
Potassium: 3.4 mmol/L — ABNORMAL LOW (ref 3.5–5.1)
Sodium: 134 mmol/L — ABNORMAL LOW (ref 135–145)
Total Bilirubin: 0.5 mg/dL (ref 0.3–1.2)
Total Protein: 7.3 g/dL (ref 6.5–8.1)

## 2022-09-08 LAB — CBC WITH DIFFERENTIAL/PLATELET
Abs Immature Granulocytes: 0.05 10*3/uL (ref 0.00–0.07)
Basophils Absolute: 0 10*3/uL (ref 0.0–0.1)
Basophils Relative: 0 %
Eosinophils Absolute: 0 10*3/uL (ref 0.0–0.5)
Eosinophils Relative: 0 %
HCT: 38.1 % (ref 36.0–46.0)
Hemoglobin: 12.6 g/dL (ref 12.0–15.0)
Immature Granulocytes: 1 %
Lymphocytes Relative: 26 %
Lymphs Abs: 2.8 10*3/uL (ref 0.7–4.0)
MCH: 30.9 pg (ref 26.0–34.0)
MCHC: 33.1 g/dL (ref 30.0–36.0)
MCV: 93.4 fL (ref 80.0–100.0)
Monocytes Absolute: 0.8 10*3/uL (ref 0.1–1.0)
Monocytes Relative: 8 %
Neutro Abs: 6.8 10*3/uL (ref 1.7–7.7)
Neutrophils Relative %: 65 %
Platelets: 255 10*3/uL (ref 150–400)
RBC: 4.08 MIL/uL (ref 3.87–5.11)
RDW: 13.2 % (ref 11.5–15.5)
WBC: 10.6 10*3/uL — ABNORMAL HIGH (ref 4.0–10.5)
nRBC: 0 % (ref 0.0–0.2)

## 2022-09-08 LAB — GLUCOSE, CAPILLARY
Glucose-Capillary: 182 mg/dL — ABNORMAL HIGH (ref 70–99)
Glucose-Capillary: 119 mg/dL — ABNORMAL HIGH (ref 70–99)

## 2022-09-08 LAB — LIPID PANEL
Cholesterol: 273 mg/dL — ABNORMAL HIGH (ref 0–200)
HDL: 69 mg/dL (ref >40)
LDL Cholesterol: 166 mg/dL — ABNORMAL HIGH (ref 0–99)
Total CHOL/HDL Ratio: 4 RATIO
Triglycerides: 191 mg/dL — ABNORMAL HIGH (ref <150)
VLDL: 38 mg/dL (ref 0–40)

## 2022-09-08 LAB — MAGNESIUM: Magnesium: 2.5 mg/dL — ABNORMAL HIGH (ref 1.7–2.4)

## 2022-09-08 LAB — PHOSPHORUS: Phosphorus: 1.9 mg/dL — ABNORMAL LOW (ref 2.5–4.6)

## 2022-09-08 LAB — MRSA NEXT GEN BY PCR, NASAL: MRSA by PCR Next Gen: NOT DETECTED

## 2022-09-08 NOTE — Progress Notes (Signed)
Pt insisting on leaving AMA d/t unspecified family emergency. Dr. Sherral Hammers notified, pt signed [place] paperwork and walked herself out of 2W into [place] elevators. Ventura Bruns, RN 09/08/22  9:08 AM

## 2022-09-08 NOTE — Progress Notes (Signed)
                                                  Against Medical Advice Patient at this time expresses desire to leave the Hospital immidiately, patient has been warned that this is not Medically advisable at this time, and can result in Medical complications like Death and Disability, patient understands and accepts the risks involved and assumes full responsibilty of this decision.  This patient has also been advised that if they feel the need for further medical assistance to return to any available ER or dial 9-1-1.  Informed by Nursing staff that this patient has left care and has signed the form  Against Medical Advice on 09/08/2022 at 9:21 AM  Dr Vicente Serene, [place]

## 2022-09-09 ENCOUNTER — Telehealth: Payer: Self-pay | Admitting: *Deleted

## 2022-09-09 NOTE — Telephone Encounter (Signed)
Transition Care Management Unsuccessful Follow-up Telephone Call  Date of discharge and from where:  09/08/2022 From [facility]  Attempts:  1st Attempt  Reason for unsuccessful TCM follow-up call:  Left voice message

## 2022-09-10 LAB — URINE CULTURE: Culture: >=100000 — AB

## 2022-09-13 LAB — CULTURE, BLOOD (ROUTINE X 2)
Culture: NO GROWTH
Culture: NO GROWTH
Special Requests: ADEQUATE

## 2022-09-20 ENCOUNTER — Encounter (HOSPITAL_BASED_OUTPATIENT_CLINIC_OR_DEPARTMENT_OTHER): Payer: Self-pay

## 2022-09-20 ENCOUNTER — Other Ambulatory Visit: Payer: Self-pay

## 2022-09-20 ENCOUNTER — Other Ambulatory Visit (HOSPITAL_BASED_OUTPATIENT_CLINIC_OR_DEPARTMENT_OTHER): Payer: Self-pay

## 2022-09-20 ENCOUNTER — Emergency Department (HOSPITAL_BASED_OUTPATIENT_CLINIC_OR_DEPARTMENT_OTHER)
Admission: EM | Admit: 2022-09-20 | Discharge: 2022-09-20 | Disposition: A | Discharge status: Home / Self Care | Payer: BC Managed Care – PPO | Attending: Emergency Medicine | Admitting: Emergency Medicine

## 2022-09-20 DIAGNOSIS — E1043 Type 1 diabetes mellitus with diabetic autonomic (poly)neuropathy: Secondary | ICD-10-CM | POA: Insufficient documentation

## 2022-09-20 DIAGNOSIS — M791 Myalgia, unspecified site: Secondary | ICD-10-CM | POA: Diagnosis not present

## 2022-09-20 DIAGNOSIS — E1010 Type 1 diabetes mellitus with ketoacidosis without coma: Secondary | ICD-10-CM | POA: Diagnosis not present

## 2022-09-20 DIAGNOSIS — R1115 Cyclical vomiting syndrome unrelated to migraine: Secondary | ICD-10-CM

## 2022-09-20 DIAGNOSIS — R739 Hyperglycemia, unspecified: Secondary | ICD-10-CM

## 2022-09-20 DIAGNOSIS — E1069 Type 1 diabetes mellitus with other specified complication: Secondary | ICD-10-CM

## 2022-09-20 DIAGNOSIS — Z794 Long term (current) use of insulin: Secondary | ICD-10-CM | POA: Insufficient documentation

## 2022-09-20 DIAGNOSIS — R1110 Vomiting, unspecified: Secondary | ICD-10-CM | POA: Diagnosis present

## 2022-09-20 DIAGNOSIS — E1065 Type 1 diabetes mellitus with hyperglycemia: Secondary | ICD-10-CM | POA: Diagnosis not present

## 2022-09-20 DIAGNOSIS — R6883 Chills (without fever): Secondary | ICD-10-CM | POA: Diagnosis not present

## 2022-09-20 LAB — I-STAT VENOUS BLOOD GAS, ED
Acid-Base Excess: 4 mmol/L — ABNORMAL HIGH (ref 0.0–2.0)
Bicarbonate: 26.7 mmol/L (ref 20.0–28.0)
Calcium, Ion: 1.21 mmol/L (ref 1.15–1.40)
HCT: 38 % (ref 36.0–46.0)
Hemoglobin: 12.9 g/dL (ref 12.0–15.0)
O2 Saturation: 65 %
Patient temperature: 98.5
Potassium: 4.2 mmol/L (ref 3.5–5.1)
Sodium: 134 mmol/L — ABNORMAL LOW (ref 135–145)
TCO2: 28 mmol/L (ref 22–32)
pCO2, Ven: 32.9 mmHg — ABNORMAL LOW (ref 44–60)
pH, Ven: 7.516 — ABNORMAL HIGH (ref 7.25–7.43)
pO2, Ven: 30 mmHg — CL (ref 32–45)

## 2022-09-20 LAB — URINALYSIS, ROUTINE W REFLEX MICROSCOPIC
Bacteria, UA: NONE SEEN
Bilirubin Urine: NEGATIVE
Glucose, UA: >1000 mg/dL — AB
Hgb urine dipstick: NEGATIVE
Ketones, ur: >80 mg/dL — AB
Leukocytes,Ua: NEGATIVE
Nitrite: NEGATIVE
Protein, ur: 30 mg/dL — AB
Specific Gravity, Urine: 1.025 (ref 1.005–1.030)
pH: 8 (ref 5.0–8.0)

## 2022-09-20 LAB — BASIC METABOLIC PANEL
Anion gap: 16 — ABNORMAL HIGH (ref 5–15)
Anion gap: 9 (ref 5–15)
BUN: 13 mg/dL (ref 6–20)
BUN: 11 mg/dL (ref 6–20)
CO2: 24 mmol/L (ref 22–32)
CO2: 30 mmol/L (ref 22–32)
Calcium: 10.5 mg/dL — ABNORMAL HIGH (ref 8.9–10.3)
Calcium: 9.6 mg/dL (ref 8.9–10.3)
Chloride: 95 mmol/L — ABNORMAL LOW (ref 98–111)
Chloride: 101 mmol/L (ref 98–111)
Creatinine, Ser: 0.76 mg/dL (ref 0.44–1.00)
Creatinine, Ser: 0.71 mg/dL (ref 0.44–1.00)
GFR, Estimated: >60 mL/min (ref >60)
GFR, Estimated: >60 mL/min (ref >60)
Glucose, Bld: 421 mg/dL — ABNORMAL HIGH (ref 70–99)
Glucose, Bld: 135 mg/dL — ABNORMAL HIGH (ref 70–99)
Potassium: 4.4 mmol/L (ref 3.5–5.1)
Potassium: 3.3 mmol/L — ABNORMAL LOW (ref 3.5–5.1)
Sodium: 135 mmol/L (ref 135–145)
Sodium: 140 mmol/L (ref 135–145)

## 2022-09-20 LAB — CBC
HCT: 37 % (ref 36.0–46.0)
Hemoglobin: 12.9 g/dL (ref 12.0–15.0)
MCH: 30.9 pg (ref 26.0–34.0)
MCHC: 34.9 g/dL (ref 30.0–36.0)
MCV: 88.7 fL (ref 80.0–100.0)
Platelets: 324 10*3/uL (ref 150–400)
RBC: 4.17 MIL/uL (ref 3.87–5.11)
RDW: 13.4 % (ref 11.5–15.5)
WBC: 8 10*3/uL (ref 4.0–10.5)
nRBC: 0 % (ref 0.0–0.2)

## 2022-09-20 LAB — PREGNANCY, URINE: Preg Test, Ur: NEGATIVE

## 2022-09-20 LAB — HEPATIC FUNCTION PANEL
ALT: 16 U/L (ref 0–44)
AST: 14 U/L — ABNORMAL LOW (ref 15–41)
Albumin: 4.5 g/dL (ref 3.5–5.0)
Alkaline Phosphatase: 76 U/L (ref 38–126)
Bilirubin, Direct: 0.1 mg/dL (ref 0.0–0.2)
Indirect Bilirubin: 0.3 mg/dL (ref 0.3–0.9)
Total Bilirubin: 0.4 mg/dL (ref 0.3–1.2)
Total Protein: 8.2 g/dL — ABNORMAL HIGH (ref 6.5–8.1)

## 2022-09-20 LAB — CBG MONITORING, ED
Glucose-Capillary: 426 mg/dL — ABNORMAL HIGH (ref 70–99)
Glucose-Capillary: 355 mg/dL — ABNORMAL HIGH (ref 70–99)
Glucose-Capillary: 291 mg/dL — ABNORMAL HIGH (ref 70–99)
Glucose-Capillary: 185 mg/dL — ABNORMAL HIGH (ref 70–99)
Glucose-Capillary: 268 mg/dL — ABNORMAL HIGH (ref 70–99)
Glucose-Capillary: 126 mg/dL — ABNORMAL HIGH (ref 70–99)
Glucose-Capillary: 161 mg/dL — ABNORMAL HIGH (ref 70–99)
Glucose-Capillary: 199 mg/dL — ABNORMAL HIGH (ref 70–99)

## 2022-09-20 LAB — LIPASE, BLOOD: Lipase: <10 U/L — ABNORMAL LOW (ref 11–51)

## 2022-09-20 MED ORDER — SODIUM CHLORIDE 0.9 % IV SOLN
25.0000 mg | Freq: Four times a day (QID) | INTRAVENOUS | Status: DC | PRN
  Administered 2022-09-20: 25 mg via INTRAVENOUS
  Filled 2022-09-20: qty 1

## 2022-09-20 MED ORDER — DEXTROSE 50 % IV SOLN: 0.0000 mL | INTRAVENOUS | Status: DC | PRN

## 2022-09-20 MED ORDER — DEXTROSE IN LACTATED RINGERS 5 % IV SOLN: INTRAVENOUS | Status: DC

## 2022-09-20 MED ORDER — LACTATED RINGERS IV SOLN: INTRAVENOUS | Status: DC

## 2022-09-20 MED ORDER — ONDANSETRON HCL 4 MG/2ML IJ SOLN
4.0000 mg | Freq: Once | INTRAMUSCULAR | Status: AC
  Administered 2022-09-20: 4 mg via INTRAVENOUS
  Filled 2022-09-20: qty 2

## 2022-09-20 MED ORDER — METOCLOPRAMIDE HCL 10 MG PO TABS
10.0000 mg | ORAL_TABLET | Freq: Four times a day (QID) | ORAL | 0 refills | Status: DC | PRN
  Filled 2022-09-20: qty 30, 8d supply, fill #0

## 2022-09-20 MED ORDER — HYDROMORPHONE HCL 1 MG/ML IJ SOLN
1.0000 mg | Freq: Once | INTRAMUSCULAR | Status: AC
  Administered 2022-09-20: 1 mg via INTRAVENOUS
  Filled 2022-09-20: qty 1

## 2022-09-20 MED ORDER — DROPERIDOL 2.5 MG/ML IJ SOLN
2.5000 mg | Freq: Once | INTRAMUSCULAR | Status: AC
  Administered 2022-09-20: 2.5 mg via INTRAVENOUS
  Filled 2022-09-20: qty 2

## 2022-09-20 MED ORDER — FENTANYL CITRATE PF 50 MCG/ML IJ SOSY
50.0000 ug | PREFILLED_SYRINGE | Freq: Once | INTRAMUSCULAR | Status: AC
  Administered 2022-09-20: 50 ug via INTRAVENOUS
  Filled 2022-09-20: qty 1

## 2022-09-20 MED ORDER — POTASSIUM CHLORIDE CRYS ER 20 MEQ PO TBCR
40.0000 meq | EXTENDED_RELEASE_TABLET | Freq: Once | ORAL | Status: AC
  Administered 2022-09-20: 40 meq via ORAL
  Filled 2022-09-20: qty 2

## 2022-09-20 MED ORDER — INSULIN REGULAR(HUMAN) IN NACL 100-0.9 UT/100ML-% IV SOLN
INTRAVENOUS | Status: DC
  Administered 2022-09-20: 15 [IU]/h via INTRAVENOUS
  Filled 2022-09-20: qty 100

## 2022-09-20 MED ORDER — HYDROMORPHONE HCL 1 MG/ML IJ SOLN
0.5000 mg | Freq: Once | INTRAMUSCULAR | Status: AC
  Administered 2022-09-20: 0.5 mg via INTRAVENOUS
  Filled 2022-09-20: qty 1

## 2022-09-20 MED ORDER — LACTATED RINGERS IV BOLUS
500.0000 mL | Freq: Once | INTRAVENOUS | Status: AC
  Administered 2022-09-20: 500 mL via INTRAVENOUS

## 2022-09-20 MED ORDER — PROMETHAZINE HCL 25 MG/ML IJ SOLN
INTRAMUSCULAR | Status: AC
  Filled 2022-09-20: qty 1

## 2022-09-20 MED ORDER — ONDANSETRON 8 MG PO TBDP
8.0000 mg | ORAL_TABLET | Freq: Three times a day (TID) | ORAL | 0 refills | Status: DC | PRN
  Filled 2022-09-20: qty 20, 7d supply, fill #0

## 2022-09-20 NOTE — ED Notes (Signed)
Blood retrieved by RRT.

## 2022-09-20 NOTE — ED Notes (Signed)
This Probation officer found Pt's Dextrose 5% in LR to be running at 983m/hr instead of 126mhr.  Unsure who changed the rate.  EMT-P and NT asked to keep an eye on infusion.

## 2022-09-20 NOTE — Discharge Instructions (Addendum)
We evaluated you for your vomiting.  Your repeat labs improved in the emergency department.  Your symptoms also improved with further treatment.  I have prescribed you a medication called Reglan which you can take every 6 hours as needed for nausea.  Please follow-up closely with your gastroenterologist.   Please return to the emergency department if you have any recurrent symptoms, nausea or vomiting, abdominal pain, fevers or chills, or any other concerning symptoms.

## 2022-09-20 NOTE — ED Triage Notes (Signed)
Pt c/o hyperglycemia, generalized body aches, chills, and emesis, starting this morning.  Pain score 10/10.  Pt report taking pain medication w/o relief.  Highest CBG 380.

## 2022-09-20 NOTE — ED Notes (Signed)
Josh, Quarry manager, RN notified of pt's CBG.

## 2022-09-20 NOTE — ED Provider Notes (Signed)
  Physical Exam  BP (!) 153/88   Pulse (!) 110   Temp 98.6 F (37 C) (Oral)   Resp 16   Ht '5\' 6"'$  (1.676 m)   Wt 70.8 kg   LMP 08/20/2022   SpO2 98%   BMI 25.18 kg/m   Physical Exam Vitals and nursing note reviewed.  Constitutional:      General: She is not in acute distress.    Appearance: She is well-developed.  HENT:     Head: Normocephalic and atraumatic.     Mouth/Throat:     Mouth: Mucous membranes are moist.  Eyes:     Pupils: Pupils are equal, round, and reactive to light.  Cardiovascular:     Rate and Rhythm: Regular rhythm. Tachycardia present.     Heart sounds: No murmur heard. Pulmonary:     Effort: Pulmonary effort is normal. No respiratory distress.     Breath sounds: Normal breath sounds.  Abdominal:     General: Abdomen is flat.     Palpations: Abdomen is soft.     Tenderness: There is no abdominal tenderness.  Musculoskeletal:        General: No tenderness.     Right lower leg: No edema.     Left lower leg: No edema.  Skin:    General: Skin is warm and dry.  Neurological:     General: No focal deficit present.     Mental Status: She is alert. Mental status is at baseline.  Psychiatric:        Mood and Affect: Mood normal.        Behavior: Behavior normal.     Procedures  Procedures  ED Course / MDM   Clinical Course as of 09/20/22 1935  Fri Sep 20, 2022  1547 Receved sign out from Dr. Jeanell Sparrow pending repeat BMP. Presenting with nausea, vomiting in setting of hyperglycemia. Symptoms improved. Will PO trial.  [WS]  1934 Patient feels better after treatment of her symptoms.  Repeat BMP reassuring with no anion gap.  Glucose improved.  Mild hypokalemia which was repleted with oral potassium.  Patient has mild ongoing tachycardia, denies any other symptoms such as chest pain, shortness of breath.  Suspect possibly mild persistent dehydration, discussed further fluid repletion with the patient who prefers to go home since she feels better.  Will  prescribe Reglan. Will discharge patient to home. All questions answered. Patient comfortable with plan of discharge. Return precautions discussed with patient and specified on the after visit summary.  [WS]    Clinical Course User Index [WS] Cristie Hem, MD   Medical Decision Making Amount and/or Complexity of Data Reviewed Labs: ordered.  Risk Prescription drug management.         Cristie Hem, MD 09/20/22 Joen Laura

## 2022-09-20 NOTE — ED Notes (Signed)
Discharge paperwork given and verbally understood... Provider informed of the need for long lasting insulin before discharge. Provider refused and noted that she has long lasting insulin at home and she was good to be discharged... Pt informed of the need to take her home insulin upon arrival... Pt understood.Marland KitchenMarland KitchenMarland Kitchen

## 2022-09-20 NOTE — Inpatient Diabetes Management (Signed)
Inpatient Diabetes Program Recommendations  AACE/ADA: New Consensus Statement on Inpatient Glycemic Control (2015)  Target Ranges:  Prepandial:   less than 140 mg/dL      Peak postprandial:   less than 180 mg/dL (1-2 hours)      Critically ill patients:  140 - 180 mg/dL   Lab Results  Component Value Date   GLUCAP 185 (H) 09/20/2022   HGBA1C 10.9 (H) 09/07/2022    Latest Reference Range & Units 09/20/22 11:20  Sodium 135 - 145 mmol/L 135  Potassium 3.5 - 5.1 mmol/L 4.4  Chloride 98 - 111 mmol/L 95 (L)  CO2 22 - 32 mmol/L 24  Glucose 70 - 99 mg/dL 421 (H)  BUN 6 - 20 mg/dL 13  Creatinine 0.44 - 1.00 mg/dL 0.76  Calcium 8.9 - 10.3 mg/dL 10.5 (H)  Anion gap 5 - 15  16 (H)  (L): Data is abnormally low (H): Data is abnormally high  Diabetes history: Type 1 DM Outpatient Diabetes medications: Levemir 30 units QD, Novolog 15-20 units TID Current orders for Inpatient glycemic control: IV insulin per endotool  Inpatient Diabetes Program Recommendations:   Patient currently @ [place] ED. DM coordinator spoke with patient regarding diabetes management on 09/06/22 during last admission.  Will follow while inpatient.  Thank you, Nani Gasser. Kimbra Marcelino, RN, MSN, CDE  Diabetes Coordinator Inpatient Glycemic Control Team Team Pager [phone] (8am-5pm) 09/20/2022 3:19 PM

## 2022-09-20 NOTE — ED Notes (Signed)
Altha Harm, IT sales professional, Paramedic notified of pt's CBG.

## 2022-09-20 NOTE — ED Provider Notes (Signed)
[name] Provider Note   CSN: [id number] Arrival date & time: 09/20/22  1038     History  Chief Complaint  Patient presents with   Hyperglycemia   Emesis    [name] is a 37 y.o. female.  HPI 37 year old female history of type 1 diabetes, DKA, gastroparesis and chronic vomiting syndrome.  Reports elevated blood sugar, body aches, chills, vomiting.  Pain is 10 out of 10.  She has been taking over-the-counter pain medication without relief.  Review of records reveals several admissions previously     Home Medications Prior to Admission medications   Medication Sig Start Date End Date Taking? Authorizing Provider  amitriptyline (ELAVIL) 25 MG tablet Take 25-75 mg by mouth at bedtime. 06/26/22   [provider]  Continuous Blood Gluc Transmit (DEXCOM G6 TRANSMITTER) MISC Inject 1 each into the skin every 14 (fourteen) days. 12/31/21   Jeanie Sewer, NP  cyanocobalamin (VITAMIN B12) 1000 MCG/ML injection Inject into the muscle. 08/31/22   [provider]  DULoxetine (CYMBALTA) 60 MG capsule Take 60 mg by mouth daily. 06/28/22   [provider]  gabapentin (NEURONTIN) 400 MG capsule Take 1 capsule (400 mg total) by mouth 3 (three) times daily. Patient not taking: Reported on 05/07/2022 03/10/22   Kathie Dike, MD  hydrOXYzine (ATARAX) 25 MG tablet Take 1 tablet (25 mg total) by mouth at bedtime as needed (insomnia). 06/03/22   Truddie Hidden, MD  insulin aspart (NOVOLOG FLEXPEN) 100 UNIT/ML FlexPen Inject 15-20 Units into the skin 3 (three) times daily with meals. plus sliding scale 05/10/22 06/09/22  Geradine Girt, DO  insulin detemir (LEVEMIR FLEXTOUCH) 100 UNIT/ML FlexPen 30 units daily-- will need to adjust as you intake at home varies 05/10/22   Eulogio Bear U, DO  Insulin Pen Needle (B-D UF III MINI PEN NEEDLES) 31G X 5 MM MISC To use with Insulin pens, as directed 11/20/21   Jeanie Sewer, NP  Insulin Syringes, Disposable, U-100 1 ML MISC Inject 1 each into the skin in the morning and at bedtime. 08/10/21   Jeanie Sewer, NP  metoCLOPramide (REGLAN) 5 MG tablet Take 1 tablet (5 mg total) by mouth 3 (three) times daily before meals. 05/10/22 05/10/23  Geradine Girt, DO  ondansetron (ZOFRAN) 4 MG tablet Take 1 tablet (4 mg total) by mouth every 8 (eight) hours as needed for nausea or vomiting. 03/10/22   Kathie Dike, MD  Oxycodone HCl 10 MG TABS Take 10 mg by mouth 3 (three) times daily as needed. 08/07/22   [provider]  pantoprazole (PROTONIX) 40 MG tablet Take 1 tablet (40 mg total) by mouth daily. 05/10/22   Geradine Girt, DO  rosuvastatin (CRESTOR) 10 MG tablet Take 10 mg by mouth daily. 07/04/22   [provider]  topiramate (TOPAMAX) 50 MG tablet Take by mouth. 04/29/22   [provider]  traZODone (DESYREL) 100 MG tablet Take 100 mg by mouth at bedtime. 03/18/22   [provider]      Allergies    Pramipexole    Review of Systems   Review of Systems  Physical Exam Updated Vital Signs BP (!) 172/108 (BP Location: Right Arm)   Pulse (!) 113   Temp 98.6 F (37 C) (Oral)   Resp 18   Ht 1.676 m ('5\' 6"'$ )   Wt 70.8 kg   LMP 08/20/2022   SpO2 97%   BMI 25.18 kg/m  Physical  Exam Vitals reviewed.  Constitutional:      General: She is in acute distress.     Appearance: Normal appearance. She is ill-appearing.  HENT:     Head: Normocephalic.     Right Ear: External ear normal.     Left Ear: External ear normal.     Nose: Nose normal.     Mouth/Throat:     Pharynx: Oropharynx is clear.  Eyes:     Pupils: Pupils are equal, round, and reactive to light.  Cardiovascular:     Rate and Rhythm: Regular rhythm. Tachycardia present.  Pulmonary:     Effort: Pulmonary effort is normal.     Breath sounds: Normal breath sounds.  Abdominal:     Tenderness: There is abdominal tenderness.     Comments: No distention  diffuse tenderness without rebound  Musculoskeletal:        General: Normal range of motion.     Cervical back: Normal range of motion.  Skin:    General: Skin is warm.     Capillary Refill: Capillary refill takes less than 2 seconds.  Neurological:     General: No focal deficit present.     Mental Status: She is alert.  Psychiatric:        Mood and Affect: Mood normal.        Behavior: Behavior normal.     ED Results / Procedures / Treatments   Labs (all labs ordered are listed, but only abnormal results are displayed) Labs Reviewed  BASIC METABOLIC PANEL - Abnormal; Notable for the following components:      Result Value   Chloride 95 (*)    Glucose, Bld 421 (*)    Calcium 10.5 (*)    Anion gap 16 (*)    All other components within normal limits  HEPATIC FUNCTION PANEL - Abnormal; Notable for the following components:   Total Protein 8.2 (*)    AST 14 (*)    All other components within normal limits  LIPASE, BLOOD - Abnormal; Notable for the following components:   Lipase <10 (*)    All other components within normal limits  CBG MONITORING, ED - Abnormal; Notable for the following components:   Glucose-Capillary 426 (*)    All other components within normal limits  I-STAT VENOUS BLOOD GAS, ED - Abnormal; Notable for the following components:   pH, Ven 7.516 (*)    pCO2, Ven 32.9 (*)    pO2, Ven 30 (*)    Acid-Base Excess 4.0 (*)    Sodium 134 (*)    All other components within normal limits  CBG MONITORING, ED - Abnormal; Notable for the following components:   Glucose-Capillary 355 (*)    All other components within normal limits  CBG MONITORING, ED - Abnormal; Notable for the following components:   Glucose-Capillary 291 (*)    All other components within normal limits  CBG MONITORING, ED - Abnormal; Notable for the following components:   Glucose-Capillary 185 (*)    All other components within normal limits  CBC  URINALYSIS, ROUTINE W REFLEX MICROSCOPIC   PREGNANCY, URINE  BLOOD GAS, VENOUS  BASIC METABOLIC PANEL    EKG None  Radiology No results found.  Procedures Procedures    Medications Ordered in ED Medications  promethazine (PHENERGAN) 25 mg in sodium chloride 0.9 % 50 mL IVPB (0 mg Intravenous Stopped 09/20/22 1303)  insulin regular, human (MYXREDLIN) 100 units/ 100 mL infusion (3.2 Units/hr Intravenous Rate/Dose Verify 09/20/22 1500)  lactated ringers infusion (0 mLs Intravenous Stopped 09/20/22 1459)  dextrose 5 % in lactated ringers infusion ( Intravenous New Bag/Given 09/20/22 1502)  dextrose 50 % solution 0-50 mL (has no administration in time range)  promethazine (PHENERGAN) 25 MG/ML injection (has no administration in time range)  ondansetron (ZOFRAN) injection 4 mg (4 mg Intravenous Given 09/20/22 1128)  fentaNYL (SUBLIMAZE) injection 50 mcg (50 mcg Intravenous Given 09/20/22 1130)  droperidol (INAPSINE) 2.5 MG/ML injection 2.5 mg (2.5 mg Intravenous Given 09/20/22 1423)  lactated ringers bolus 500 mL (0 mLs Intravenous Stopped 09/20/22 1459)  HYDROmorphone (DILAUDID) injection 1 mg (1 mg Intravenous Given 09/20/22 1454)    ED Course/ Medical Decision Making/ A&P Clinical Course as of 09/20/22 1605  Fri Sep 20, 2022  1547 Receved sign out from Dr. Jeanell Sparrow pending repeat BMP. Presenting with nausea, vomiting in setting of hyperglycemia. Symptoms improved. Will PO trial.  [WS]    Clinical Course User Index [WS] Cristie Hem, MD                             Medical Decision Making Amount and/or Complexity of Data Reviewed Labs: ordered.  Risk Prescription drug management.   37 year old female with cyclical vomiting syndrome and diabetes presents today with nausea vomiting abdominal pain.  Patient evaluated with labs and found to be hyperglycemic with initial blood sugar elevated at 420  Differential diagnosis includes but is not limited to DKA, hyperglycemia, cyclical vomiting syndrome, small bowel obstruction, other  acute intra-abdominal etiologies. Patient's abdomen is soft.  Feels this is consistent with her prior episodes of cyclical vomiting. Patient was evaluated for DKA a with labs and had normal CO2, pH is 7.51, and lightly elevated anion gap at 16.  She was treated with IV fluids and insulin.  Blood sugar has decreased to 185.  Repeat bmet  is pending Patient is reevaluated she feels improved after Zofran and 1 of Dilaudid.  She states she has no antiemetics at home. Plan oral fluid trial Repeat be met  Discussed with Dr. Truett Mainland who has assumed care       Final Clinical Impression(s) / ED Diagnoses Final diagnoses:  Cyclical vomiting  Hyperglycemia  Type 1 diabetes mellitus with other specified complication [facility])    Rx / DC Orders ED Discharge Orders     None         Pattricia Boss, MD 09/20/22 1605

## 2022-09-20 NOTE — ED Notes (Signed)
Provider verbally requested to stop the fluids and the insulin due to the CBG being 126... Fluids and insulin stopped... Provider requested a recheck CBG roughly an hour from now.Marland KitchenMarland Kitchen

## 2022-09-20 NOTE — ED Notes (Signed)
Went into room to restart pump that was alerting line occluded. Dr Jeanell Sparrow was at bedside speaking to Pt. Dr Jeanell Sparrow gave verbal order to give fluid bolus and to just go ahead and bolus in the rest of the fluid that was already hanging. Confirmed with Dr Jeanell Sparrow to go ahead and increase IV fluids to a bolus and that LR D5 is what is hanging. Dr Jeanell Sparrow said yes, that is fine and gave the go ahead. IV fluid was changed at the pump to a bolus to let the remainder of the fluid run in.

## 2022-09-21 ENCOUNTER — Other Ambulatory Visit (HOSPITAL_BASED_OUTPATIENT_CLINIC_OR_DEPARTMENT_OTHER): Payer: Self-pay

## 2022-09-22 ENCOUNTER — Encounter (HOSPITAL_BASED_OUTPATIENT_CLINIC_OR_DEPARTMENT_OTHER): Payer: Self-pay | Admitting: Emergency Medicine

## 2022-09-22 ENCOUNTER — Emergency Department (HOSPITAL_BASED_OUTPATIENT_CLINIC_OR_DEPARTMENT_OTHER)
Admission: EM | Admit: 2022-09-22 | Discharge: 2022-09-22 | Disposition: A | Discharge status: Home / Self Care | Payer: BC Managed Care – PPO | Attending: Emergency Medicine | Admitting: Emergency Medicine

## 2022-09-22 DIAGNOSIS — E1040 Type 1 diabetes mellitus with diabetic neuropathy, unspecified: Secondary | ICD-10-CM | POA: Diagnosis not present

## 2022-09-22 DIAGNOSIS — Z794 Long term (current) use of insulin: Secondary | ICD-10-CM | POA: Insufficient documentation

## 2022-09-22 DIAGNOSIS — R1110 Vomiting, unspecified: Secondary | ICD-10-CM | POA: Diagnosis present

## 2022-09-22 DIAGNOSIS — G43A0 Cyclical vomiting, not intractable: Secondary | ICD-10-CM | POA: Insufficient documentation

## 2022-09-22 DIAGNOSIS — E1010 Type 1 diabetes mellitus with ketoacidosis without coma: Secondary | ICD-10-CM | POA: Insufficient documentation

## 2022-09-22 DIAGNOSIS — R1115 Cyclical vomiting syndrome unrelated to migraine: Secondary | ICD-10-CM

## 2022-09-22 LAB — I-STAT VENOUS BLOOD GAS, ED
Acid-Base Excess: 7 mmol/L — ABNORMAL HIGH (ref 0.0–2.0)
Bicarbonate: 27.7 mmol/L (ref 20.0–28.0)
Calcium, Ion: 1.06 mmol/L — ABNORMAL LOW (ref 1.15–1.40)
HCT: 38 % (ref 36.0–46.0)
Hemoglobin: 12.9 g/dL (ref 12.0–15.0)
O2 Saturation: 61 %
Patient temperature: 99.2
Potassium: 3.9 mmol/L (ref 3.5–5.1)
Sodium: 133 mmol/L — ABNORMAL LOW (ref 135–145)
TCO2: 29 mmol/L (ref 22–32)
pCO2, Ven: 28.4 mmHg — ABNORMAL LOW (ref 44–60)
pH, Ven: 7.599 — ABNORMAL HIGH (ref 7.25–7.43)
pO2, Ven: 26 mmHg — CL (ref 32–45)

## 2022-09-22 LAB — BASIC METABOLIC PANEL
Anion gap: 13 (ref 5–15)
BUN: 10 mg/dL (ref 6–20)
CO2: 26 mmol/L (ref 22–32)
Calcium: 10 mg/dL (ref 8.9–10.3)
Chloride: 95 mmol/L — ABNORMAL LOW (ref 98–111)
Creatinine, Ser: 0.81 mg/dL (ref 0.44–1.00)
GFR, Estimated: >60 mL/min (ref >60)
Glucose, Bld: 160 mg/dL — ABNORMAL HIGH (ref 70–99)
Potassium: 3.9 mmol/L (ref 3.5–5.1)
Sodium: 134 mmol/L — ABNORMAL LOW (ref 135–145)

## 2022-09-22 LAB — CBC WITH DIFFERENTIAL/PLATELET
Abs Immature Granulocytes: 0.02 10*3/uL (ref 0.00–0.07)
Basophils Absolute: 0 10*3/uL (ref 0.0–0.1)
Basophils Relative: 0 %
Eosinophils Absolute: 0.1 10*3/uL (ref 0.0–0.5)
Eosinophils Relative: 1 %
HCT: 37.1 % (ref 36.0–46.0)
Hemoglobin: 12.7 g/dL (ref 12.0–15.0)
Immature Granulocytes: 0 %
Lymphocytes Relative: 18 %
Lymphs Abs: 1.6 10*3/uL (ref 0.7–4.0)
MCH: 30.5 pg (ref 26.0–34.0)
MCHC: 34.2 g/dL (ref 30.0–36.0)
MCV: 89.2 fL (ref 80.0–100.0)
Monocytes Absolute: 1 10*3/uL (ref 0.1–1.0)
Monocytes Relative: 11 %
Neutro Abs: 6.4 10*3/uL (ref 1.7–7.7)
Neutrophils Relative %: 70 %
Platelets: 312 10*3/uL (ref 150–400)
RBC: 4.16 MIL/uL (ref 3.87–5.11)
RDW: 13.4 % (ref 11.5–15.5)
WBC: 9.1 10*3/uL (ref 4.0–10.5)
nRBC: 0 % (ref 0.0–0.2)

## 2022-09-22 LAB — LIPASE, BLOOD: Lipase: <10 U/L — ABNORMAL LOW (ref 11–51)

## 2022-09-22 LAB — HCG, SERUM, QUALITATIVE: Preg, Serum: NEGATIVE

## 2022-09-22 LAB — HEPATIC FUNCTION PANEL
ALT: 12 U/L (ref 0–44)
AST: 12 U/L — ABNORMAL LOW (ref 15–41)
Albumin: 4.3 g/dL (ref 3.5–5.0)
Alkaline Phosphatase: 62 U/L (ref 38–126)
Bilirubin, Direct: 0.1 mg/dL (ref 0.0–0.2)
Indirect Bilirubin: 0.3 mg/dL (ref 0.3–0.9)
Total Bilirubin: 0.4 mg/dL (ref 0.3–1.2)
Total Protein: 7.9 g/dL (ref 6.5–8.1)

## 2022-09-22 LAB — TROPONIN I (HIGH SENSITIVITY): Troponin I (High Sensitivity): <2 ng/L (ref <18)

## 2022-09-22 LAB — CBG MONITORING, ED: Glucose-Capillary: 165 mg/dL — ABNORMAL HIGH (ref 70–99)

## 2022-09-22 MED ORDER — ONDANSETRON HCL 4 MG/2ML IJ SOLN
4.0000 mg | Freq: Once | INTRAMUSCULAR | Status: DC | PRN
  Filled 2022-09-22: qty 2

## 2022-09-22 MED ORDER — DIPHENHYDRAMINE HCL 50 MG/ML IJ SOLN
25.0000 mg | Freq: Once | INTRAMUSCULAR | Status: AC
  Administered 2022-09-22: 25 mg via INTRAVENOUS
  Filled 2022-09-22: qty 1

## 2022-09-22 MED ORDER — SODIUM CHLORIDE 0.9 % IV BOLUS
1000.0000 mL | Freq: Once | INTRAVENOUS | Status: AC
  Administered 2022-09-22: 1000 mL via INTRAVENOUS

## 2022-09-22 MED ORDER — METOCLOPRAMIDE HCL 5 MG/ML IJ SOLN
10.0000 mg | Freq: Once | INTRAMUSCULAR | Status: AC
  Administered 2022-09-22: 10 mg via INTRAVENOUS
  Filled 2022-09-22: qty 2

## 2022-09-22 MED ORDER — KETOROLAC TROMETHAMINE 15 MG/ML IJ SOLN
15.0000 mg | Freq: Once | INTRAMUSCULAR | Status: AC
  Administered 2022-09-22: 15 mg via INTRAVENOUS
  Filled 2022-09-22: qty 1

## 2022-09-22 MED ORDER — ONDANSETRON HCL 4 MG/2ML IJ SOLN
4.0000 mg | Freq: Once | INTRAMUSCULAR | Status: AC
  Administered 2022-09-22: 4 mg via INTRAVENOUS

## 2022-09-22 NOTE — ED Provider Notes (Signed)
[facility] Provider Note   CSN: [id number] Arrival date & time: 09/22/22  0715     History  Chief Complaint  Patient presents with   Near Syncope    [name] is a 37 y.o. female.  Patient is a 37 year old female with past medical history of type 1 diabetes, DKA, and diabetic neuropathy presenting for complaints of nausea and vomiting.  Patient admits to nausea and vomiting, generalized abdominal pain, generalized weakness, and fatigue over the last several days.  Denies any nasal congestion, rhinorrhea, coughing, abdominal pain, or diarrhea.  States has been compliant on her insulin throughout the day and takes 40 units of Levemir at night.  Chart review demonstrates patient was seen 2 days ago on 09/20/2022 for similar symptoms with initial glucose on arrival of 355 without signs of DKA.  On chart review it seems as though each time patient has presented in the last 2 visits that she has been hyperventilating and has had respiratory alkalosis.  Was discharged at that time with prescription for Reglan with minimal improvement of symptoms.  Patient states she passed out prior to arrival to emergency department this morning.  Denies any head trauma or blood thinner use.   The history is provided by the patient. No language interpreter was used.  Near Syncope Associated symptoms include abdominal pain. Pertinent negatives include no chest pain and no shortness of breath.       Home Medications Prior to Admission medications   Medication Sig Start Date End Date Taking? Authorizing Provider  amitriptyline (ELAVIL) 25 MG tablet Take 25-75 mg by mouth at bedtime. 06/26/22   [provider]  Continuous Blood Gluc Transmit (DEXCOM G6 TRANSMITTER) MISC Inject 1 each into the skin every 14 (fourteen) days. 12/31/21   Jeanie Sewer, NP  cyanocobalamin (VITAMIN B12) 1000 MCG/ML injection Inject into the muscle. 08/31/22    [provider]  DULoxetine (CYMBALTA) 60 MG capsule Take 60 mg by mouth daily. 06/28/22   [provider]  gabapentin (NEURONTIN) 400 MG capsule Take 1 capsule (400 mg total) by mouth 3 (three) times daily. Patient not taking: Reported on 05/07/2022 03/10/22   Kathie Dike, MD  hydrOXYzine (ATARAX) 25 MG tablet Take 1 tablet (25 mg total) by mouth at bedtime as needed (insomnia). 06/03/22   Truddie Hidden, MD  insulin aspart (NOVOLOG FLEXPEN) 100 UNIT/ML FlexPen Inject 15-20 Units into the skin 3 (three) times daily with meals. plus sliding scale 05/10/22 06/09/22  Geradine Girt, DO  insulin detemir (LEVEMIR FLEXTOUCH) 100 UNIT/ML FlexPen 30 units daily-- will need to adjust as you intake at home varies 05/10/22   Eulogio Bear U, DO  Insulin Pen Needle (B-D UF III MINI PEN NEEDLES) 31G X 5 MM MISC To use with Insulin pens, as directed 11/20/21   Jeanie Sewer, NP  Insulin Syringes, Disposable, U-100 1 ML MISC Inject 1 each into the skin in the morning and at bedtime. 08/10/21   Jeanie Sewer, NP  metoCLOPramide (REGLAN) 10 MG tablet Take 1 tablet (10 mg total) by mouth every 6 (six) hours as needed for nausea or vomiting. 09/20/22   Cristie Hem, MD  ondansetron (ZOFRAN) 4 MG tablet Take 1 tablet (4 mg total) by mouth every 8 (eight) hours as needed for nausea or vomiting. 03/10/22   Kathie Dike, MD  Oxycodone HCl 10 MG TABS Take 10 mg by mouth 3 (three) times daily as needed. 08/07/22   [provider]  pantoprazole (PROTONIX) 40 MG tablet Take 1 tablet (40 mg total) by mouth daily. 05/10/22   Geradine Girt, DO  rosuvastatin (CRESTOR) 10 MG tablet Take 10 mg by mouth daily. 07/04/22   [provider]  topiramate (TOPAMAX) 50 MG tablet Take by mouth. 04/29/22   [provider]  traZODone (DESYREL) 100 MG tablet Take 100 mg by mouth at bedtime. 03/18/22   [provider]      Allergies    Pramipexole    Review of Systems    Review of Systems  Constitutional:  Negative for chills and fever.  HENT:  Negative for ear pain and sore throat.   Eyes:  Negative for pain and visual disturbance.  Respiratory:  Negative for cough and shortness of breath.   Cardiovascular:  Positive for near-syncope. Negative for chest pain and palpitations.  Gastrointestinal:  Positive for abdominal pain, nausea and vomiting.  Genitourinary:  Negative for dysuria and hematuria.  Musculoskeletal:  Negative for arthralgias and back pain.  Skin:  Negative for color change and rash.  Neurological:  Negative for seizures and syncope.  All other systems reviewed and are negative.   Physical Exam Updated Vital Signs BP (!) 147/91   Pulse (!) 105   Resp (!) 32   LMP 08/20/2022   SpO2 100%  Physical Exam Vitals and nursing note reviewed.  Constitutional:      General: She is not in acute distress.    Appearance: She is well-developed.  HENT:     Head: Normocephalic and atraumatic.  Eyes:     Conjunctiva/sclera: Conjunctivae normal.  Cardiovascular:     Rate and Rhythm: Normal rate and regular rhythm.     Heart sounds: No murmur heard. Pulmonary:     Effort: Pulmonary effort is normal. No respiratory distress.     Breath sounds: Normal breath sounds.  Abdominal:     Palpations: Abdomen is soft.     Tenderness: There is no abdominal tenderness.  Musculoskeletal:        General: No swelling.     Cervical back: Neck supple.  Skin:    General: Skin is warm and dry.     Capillary Refill: Capillary refill takes less than 2 seconds.  Neurological:     Mental Status: She is alert.  Psychiatric:        Mood and Affect: Mood normal.     ED Results / Procedures / Treatments   Labs (all labs ordered are listed, but only abnormal results are displayed) Labs Reviewed  BASIC METABOLIC PANEL - Abnormal; Notable for the following components:      Result Value   Sodium 134 (*)    Chloride 95 (*)    Glucose, Bld 160 (*)    All  other components within normal limits  LIPASE, BLOOD - Abnormal; Notable for the following components:   Lipase <10 (*)    All other components within normal limits  HEPATIC FUNCTION PANEL - Abnormal; Notable for the following components:   AST 12 (*)    All other components within normal limits  CBG MONITORING, ED - Abnormal; Notable for the following components:   Glucose-Capillary 165 (*)    All other components within normal limits  I-STAT VENOUS BLOOD GAS, ED - Abnormal; Notable for the following components:   pH, Ven 7.599 (*)    pCO2, Ven 28.4 (*)    pO2, Ven 26 (*)    Acid-Base Excess 7.0 (*)  Sodium 133 (*)    Calcium, Ion 1.06 (*)    All other components within normal limits  HCG, SERUM, QUALITATIVE  CBC WITH DIFFERENTIAL/PLATELET  URINALYSIS, ROUTINE W REFLEX MICROSCOPIC  CBG MONITORING, ED  TROPONIN I (HIGH SENSITIVITY)  TROPONIN I (HIGH SENSITIVITY)    EKG EKG Interpretation  Date/Time:  Sunday September 22 2022 07:58:01 EST Ventricular Rate:  111 PR Interval:  142 QRS Duration: 66 QT Interval:  327 QTC Calculation: 445 R Axis:   85 Text Interpretation: Sinus tachycardia Confirmed by Campbell Stall ([id number]) on 09/25/348 8:19:58 AM  Radiology No results found.  Procedures Procedures    Medications Ordered in ED Medications  ondansetron (ZOFRAN) injection 4 mg (has no administration in time range)  ondansetron (ZOFRAN) injection 4 mg (4 mg Intravenous Given 09/22/22 0741)  sodium chloride 0.9 % bolus 1,000 mL (1,000 mLs Intravenous New Bag/Given 09/22/22 0938)  ketorolac (TORADOL) 15 MG/ML injection 15 mg (15 mg Intravenous Given 09/22/22 1829)  metoCLOPramide (REGLAN) injection 10 mg (10 mg Intravenous Given 09/22/22 0902)  diphenhydrAMINE (BENADRYL) injection 25 mg (25 mg Intravenous Given 09/22/22 0902)    ED Course/ Medical Decision Making/ A&P                             Medical Decision Making Amount and/or Complexity of Data Reviewed Labs:  ordered.  Risk Prescription drug management.   68:64 AM 37 year old female with past medical history of type 1 diabetes, DKA, and diabetic neuropathy presenting for complaints of nausea and vomiting.  Patient admits to nausea and vomiting, generalized abdominal pain, generalized weakness, and fatigue over the last several days.  Denies any nasal congestion, rhinorrhea, coughing, abdominal pain, or diarrhea.  States has been compliant on her insulin throughout the day and takes 40 units of Levemir at night.  On exam patient is alert oriented x 3, hyperventilating, and tearful due to nausea.  IV fluids and Zofran given.  Chart review demonstrates patient was seen 2 days ago on 09/20/2022 for similar symptoms with initial glucose on arrival of 355 without signs of DKA.  On chart review it seems as though each time patient has presented in the last 2 visits that she has been hyperventilating and has had respiratory alkalosis.  Glucose today 165.  VBG demonstrating respiratory alkalosis likely secondary to hyperventilating.  Stable electrolytes.  Stable liver profile, lipase, and renal function.  Abdomen is soft and nontender at this time-life-threatening etiologies of nausea and vomiting considered... Including but not limited to bowel obstruction, intussusception, perforation...however thought to be less likely at this time.  No abdominal imaging ordered today.  Symptoms thought to be likely secondary to cyclical vomiting syndrome.  On reevaluation patient had complete resolution of symptoms.  Already has prescriptions for Reglan and Benadryl at the house that was prescribed 2 days ago on 09/20/2022.  Recommended for close follow-up with patient's established gastroenterologist through [facility] group and/or primary care team for further management.   Patient in no distress and overall condition improved here in the ED. Detailed discussions were had with the patient regarding current findings, and need for  close f/u with PCP or on call doctor. The patient has been instructed to return immediately if the symptoms worsen in any way for re-evaluation. Patient verbalized understanding and is in agreement with current care plan. All questions answered prior to discharge.         Final Clinical Impression(s) / ED Diagnoses Final diagnoses:  Cyclical vomiting    Rx / DC Orders ED Discharge Orders     None         Lianne Cure, DO 15/05/69 1011

## 2022-09-22 NOTE — ED Triage Notes (Signed)
BIB husband for near syncope, husband left as soon as pt received, pt returns to ED, was here yesterday, h/o DM, associated sx include: weak, syncope, fatigue, NV, emotional, anxiety, hyperventilation, panic. Triage and care delayed d/t emotional behavior and panic. Semi-cooperative, following some commands. Skin W&D.

## 2022-09-22 NOTE — ED Notes (Signed)
[facility], breathing easier, more cooperative, VSS.

## 2022-09-22 NOTE — ED Notes (Signed)
Delay in care d/t emotional agitation, restlessness, anxiety. C/o nausea, weak, passing out. Talkative, rapid pressured speech and crying. Semi cooperative.

## 2022-09-22 NOTE — ED Notes (Signed)
EDP at BS 

## 2022-09-22 NOTE — Discharge Instructions (Addendum)
Today had stable laboratory studies including liver profile, pancreatic function, and kidney function.  No overt signs of dehydration.  Blood sugar was stable.  No signs of DKA.    Rest and take your prescription for Reglan and Benadryl as needed for nausea and vomiting at the house.  Return to ED if unable to tolerate fluids.  Otherwise please follow-up with your established gastroenterologist if your cyclical vomiting symptoms do not improve.

## 2022-09-22 NOTE — ED Notes (Signed)
[place], EKG in progress, HR ST 116

## 2022-09-23 ENCOUNTER — Telehealth: Payer: Self-pay

## 2022-09-23 NOTE — Telephone Encounter (Signed)
Unable to LMOVM due to full mailbox. Was calling to go over ER visit and suggest appt with PCP

## 2022-09-27 ENCOUNTER — Emergency Department (HOSPITAL_BASED_OUTPATIENT_CLINIC_OR_DEPARTMENT_OTHER)
Admission: EM | Admit: 2022-09-27 | Discharge: 2022-09-27 | Disposition: A | Discharge status: Home / Self Care | Payer: BC Managed Care – PPO | Attending: Emergency Medicine | Admitting: Emergency Medicine

## 2022-09-27 ENCOUNTER — Emergency Department (HOSPITAL_BASED_OUTPATIENT_CLINIC_OR_DEPARTMENT_OTHER): Payer: BC Managed Care – PPO

## 2022-09-27 ENCOUNTER — Encounter (HOSPITAL_BASED_OUTPATIENT_CLINIC_OR_DEPARTMENT_OTHER): Payer: Self-pay | Admitting: Emergency Medicine

## 2022-09-27 ENCOUNTER — Other Ambulatory Visit: Payer: Self-pay

## 2022-09-27 DIAGNOSIS — Z794 Long term (current) use of insulin: Secondary | ICD-10-CM | POA: Diagnosis not present

## 2022-09-27 DIAGNOSIS — E1140 Type 2 diabetes mellitus with diabetic neuropathy, unspecified: Secondary | ICD-10-CM | POA: Diagnosis not present

## 2022-09-27 DIAGNOSIS — R112 Nausea with vomiting, unspecified: Secondary | ICD-10-CM | POA: Diagnosis present

## 2022-09-27 DIAGNOSIS — U071 COVID-19: Secondary | ICD-10-CM | POA: Diagnosis not present

## 2022-09-27 LAB — URINALYSIS, ROUTINE W REFLEX MICROSCOPIC
Bacteria, UA: NONE SEEN
Bilirubin Urine: NEGATIVE
Glucose, UA: NEGATIVE mg/dL
Hgb urine dipstick: NEGATIVE
Ketones, ur: 15 mg/dL — AB
Leukocytes,Ua: NEGATIVE
Nitrite: NEGATIVE
Protein, ur: >300 mg/dL — AB
Specific Gravity, Urine: 1.019 (ref 1.005–1.030)
pH: 8.5 — ABNORMAL HIGH (ref 5.0–8.0)

## 2022-09-27 LAB — I-STAT VENOUS BLOOD GAS, ED
Acid-Base Excess: 7 mmol/L — ABNORMAL HIGH (ref 0.0–2.0)
Bicarbonate: 30.6 mmol/L — ABNORMAL HIGH (ref 20.0–28.0)
Calcium, Ion: 1.2 mmol/L (ref 1.15–1.40)
HCT: 39 % (ref 36.0–46.0)
Hemoglobin: 13.3 g/dL (ref 12.0–15.0)
O2 Saturation: 51 %
Patient temperature: 99.1
Potassium: 3.1 mmol/L — ABNORMAL LOW (ref 3.5–5.1)
Sodium: 136 mmol/L (ref 135–145)
TCO2: 32 mmol/L (ref 22–32)
pCO2, Ven: 38.1 mmHg — ABNORMAL LOW (ref 44–60)
pH, Ven: 7.514 — ABNORMAL HIGH (ref 7.25–7.43)
pO2, Ven: 25 mmHg — CL (ref 32–45)

## 2022-09-27 LAB — CBC WITH DIFFERENTIAL/PLATELET
Abs Immature Granulocytes: 0.03 10*3/uL (ref 0.00–0.07)
Basophils Absolute: 0 10*3/uL (ref 0.0–0.1)
Basophils Relative: 0 %
Eosinophils Absolute: 0 10*3/uL (ref 0.0–0.5)
Eosinophils Relative: 0 %
HCT: 37.5 % (ref 36.0–46.0)
Hemoglobin: 13.4 g/dL (ref 12.0–15.0)
Immature Granulocytes: 0 %
Lymphocytes Relative: 18 %
Lymphs Abs: 1.6 10*3/uL (ref 0.7–4.0)
MCH: 30.6 pg (ref 26.0–34.0)
MCHC: 35.7 g/dL (ref 30.0–36.0)
MCV: 85.6 fL (ref 80.0–100.0)
Monocytes Absolute: 0.4 10*3/uL (ref 0.1–1.0)
Monocytes Relative: 4 %
Neutro Abs: 7.3 10*3/uL (ref 1.7–7.7)
Neutrophils Relative %: 78 %
Platelets: 387 10*3/uL (ref 150–400)
RBC: 4.38 MIL/uL (ref 3.87–5.11)
RDW: 12.7 % (ref 11.5–15.5)
WBC: 9.4 10*3/uL (ref 4.0–10.5)
nRBC: 0 % (ref 0.0–0.2)

## 2022-09-27 LAB — COMPREHENSIVE METABOLIC PANEL
ALT: 13 U/L (ref 0–44)
AST: 16 U/L (ref 15–41)
Albumin: 4.7 g/dL (ref 3.5–5.0)
Alkaline Phosphatase: 75 U/L (ref 38–126)
Anion gap: 13 (ref 5–15)
BUN: 9 mg/dL (ref 6–20)
CO2: 31 mmol/L (ref 22–32)
Calcium: 10.5 mg/dL — ABNORMAL HIGH (ref 8.9–10.3)
Chloride: 94 mmol/L — ABNORMAL LOW (ref 98–111)
Creatinine, Ser: 0.66 mg/dL (ref 0.44–1.00)
GFR, Estimated: >60 mL/min (ref >60)
Glucose, Bld: 186 mg/dL — ABNORMAL HIGH (ref 70–99)
Potassium: 3.1 mmol/L — ABNORMAL LOW (ref 3.5–5.1)
Sodium: 138 mmol/L (ref 135–145)
Total Bilirubin: 0.4 mg/dL (ref 0.3–1.2)
Total Protein: 8.2 g/dL — ABNORMAL HIGH (ref 6.5–8.1)

## 2022-09-27 LAB — RESP PANEL BY RT-PCR (RSV, FLU A&B, COVID)  RVPGX2
Influenza A by PCR: NEGATIVE
Influenza B by PCR: NEGATIVE
Resp Syncytial Virus by PCR: NEGATIVE
SARS Coronavirus 2 by RT PCR: POSITIVE — AB

## 2022-09-27 LAB — LIPASE, BLOOD: Lipase: <10 U/L — ABNORMAL LOW (ref 11–51)

## 2022-09-27 LAB — BETA-HYDROXYBUTYRIC ACID: Beta-Hydroxybutyric Acid: 0.43 mmol/L — ABNORMAL HIGH (ref 0.05–0.27)

## 2022-09-27 LAB — PREGNANCY, URINE: Preg Test, Ur: NEGATIVE

## 2022-09-27 MED ORDER — NIRMATRELVIR/RITONAVIR (PAXLOVID)TABLET: 3.0000 | ORAL_TABLET | Freq: Two times a day (BID) | ORAL | 0 refills | Status: DC

## 2022-09-27 MED ORDER — METOCLOPRAMIDE HCL 5 MG/ML IJ SOLN: 10.0000 mg | Freq: Once | INTRAMUSCULAR | Status: DC

## 2022-09-27 MED ORDER — HALOPERIDOL LACTATE 5 MG/ML IJ SOLN: 5.0000 mg | Freq: Once | INTRAMUSCULAR | Status: DC

## 2022-09-27 MED ORDER — DIPHENHYDRAMINE HCL 50 MG/ML IJ SOLN
25.0000 mg | Freq: Once | INTRAMUSCULAR | Status: AC
  Administered 2022-09-27: 25 mg via INTRAVENOUS
  Filled 2022-09-27: qty 1

## 2022-09-27 MED ORDER — SODIUM CHLORIDE 0.9 % IV SOLN: Freq: Once | INTRAVENOUS | Status: AC

## 2022-09-27 MED ORDER — NIRMATRELVIR/RITONAVIR (PAXLOVID)TABLET: 3.0000 | ORAL_TABLET | Freq: Two times a day (BID) | ORAL | 0 refills | Status: AC

## 2022-09-27 MED ORDER — HALOPERIDOL LACTATE 5 MG/ML IJ SOLN
5.0000 mg | Freq: Once | INTRAMUSCULAR | Status: AC
  Administered 2022-09-27: 5 mg via INTRAVENOUS
  Filled 2022-09-27: qty 1

## 2022-09-27 MED ORDER — PANTOPRAZOLE SODIUM 40 MG IV SOLR
40.0000 mg | Freq: Once | INTRAVENOUS | Status: AC
  Administered 2022-09-27: 40 mg via INTRAVENOUS
  Filled 2022-09-27: qty 10

## 2022-09-27 MED ORDER — LACTATED RINGERS IV BOLUS
1000.0000 mL | Freq: Once | INTRAVENOUS | Status: AC
  Administered 2022-09-27: 1000 mL via INTRAVENOUS

## 2022-09-27 MED ORDER — PROMETHAZINE HCL 25 MG PO TABS: 25.0000 mg | ORAL_TABLET | Freq: Four times a day (QID) | ORAL | 0 refills | Status: DC | PRN

## 2022-09-27 MED ORDER — IOHEXOL 300 MG/ML  SOLN
100.0000 mL | Freq: Once | INTRAMUSCULAR | Status: AC | PRN
  Administered 2022-09-27: 100 mL via INTRAVENOUS

## 2022-09-27 NOTE — ED Notes (Signed)
Pt called out. This tech entered room and found her IV pulled out of her arm. Pt stated she was just trying to sit up in the bed when it pulled out. Pt very fidgety and moving around a lot in the bed. Cleaned site and notified RN.

## 2022-09-27 NOTE — ED Provider Notes (Signed)
[facility] Provider Note   CSN: [id number] Arrival date & time: 09/27/22  [id number]     History  Chief Complaint  Patient presents with   Abdominal Pain   Emesis    [name] is a 37 y.o. female.  Patient here with nausea and vomiting.  History of gastroparesis and DKA.  She is having 10 out of 10 abdominal pain.  She has been throwing up all night.  History of diabetes, neuropathy.  Denies any alcohol or drug use.  Denies any fever or suspicious food intake.  Has not been able to hold anything down for the last day or so.  The history is provided by the patient.       Home Medications Prior to Admission medications   Medication Sig Start Date End Date Taking? Authorizing Provider  amitriptyline (ELAVIL) 25 MG tablet Take 25-75 mg by mouth at bedtime. 06/26/22   [provider]  Continuous Blood Gluc Transmit (DEXCOM G6 TRANSMITTER) MISC Inject 1 each into the skin every 14 (fourteen) days. 12/31/21   Jeanie Sewer, NP  cyanocobalamin (VITAMIN B12) 1000 MCG/ML injection Inject into the muscle. 08/31/22   [provider]  DULoxetine (CYMBALTA) 60 MG capsule Take 60 mg by mouth daily. 06/28/22   [provider]  gabapentin (NEURONTIN) 400 MG capsule Take 1 capsule (400 mg total) by mouth 3 (three) times daily. Patient not taking: Reported on 05/07/2022 03/10/22   Kathie Dike, MD  hydrOXYzine (ATARAX) 25 MG tablet Take 1 tablet (25 mg total) by mouth at bedtime as needed (insomnia). 06/03/22   Truddie Hidden, MD  insulin aspart (NOVOLOG FLEXPEN) 100 UNIT/ML FlexPen Inject 15-20 Units into the skin 3 (three) times daily with meals. plus sliding scale 05/10/22 06/09/22  Geradine Girt, DO  insulin detemir (LEVEMIR FLEXTOUCH) 100 UNIT/ML FlexPen 30 units daily-- will need to adjust as you intake at home varies 05/10/22   Eulogio Bear U, DO  Insulin Pen Needle (B-D UF III MINI PEN NEEDLES) 31G X 5  MM MISC To use with Insulin pens, as directed 11/20/21   Jeanie Sewer, NP  Insulin Syringes, Disposable, U-100 1 ML MISC Inject 1 each into the skin in the morning and at bedtime. 08/10/21   Jeanie Sewer, NP  metoCLOPramide (REGLAN) 10 MG tablet Take 1 tablet (10 mg total) by mouth every 6 (six) hours as needed for nausea or vomiting. 09/20/22   Cristie Hem, MD  ondansetron (ZOFRAN) 4 MG tablet Take 1 tablet (4 mg total) by mouth every 8 (eight) hours as needed for nausea or vomiting. 03/10/22   Kathie Dike, MD  Oxycodone HCl 10 MG TABS Take 10 mg by mouth 3 (three) times daily as needed. 08/07/22   [provider]  pantoprazole (PROTONIX) 40 MG tablet Take 1 tablet (40 mg total) by mouth daily. 05/10/22   Geradine Girt, DO  rosuvastatin (CRESTOR) 10 MG tablet Take 10 mg by mouth daily. 07/04/22   [provider]  topiramate (TOPAMAX) 50 MG tablet Take by mouth. 04/29/22   [provider]  traZODone (DESYREL) 100 MG tablet Take 100 mg by mouth at bedtime. 03/18/22   [provider]      Allergies    Pramipexole    Review of Systems   Review of Systems  Physical Exam Updated Vital Signs BP (!) 133/103 (BP Location: Right Arm)   Pulse (!) 101   Temp 99.1 F (37.3 C) (  Oral)   Resp (!) 22   Ht 5' 6"$  (1.676 m)   Wt 70.8 kg   LMP 08/20/2022   SpO2 97%   BMI 25.18 kg/m  Physical Exam Vitals and nursing note reviewed.  Constitutional:      General: She is not in acute distress.    Appearance: She is well-developed. She is ill-appearing.  HENT:     Head: Normocephalic and atraumatic.     Mouth/Throat:     Mouth: Mucous membranes are moist.  Eyes:     Conjunctiva/sclera: Conjunctivae normal.  Cardiovascular:     Rate and Rhythm: Normal rate and regular rhythm.     Heart sounds: Normal heart sounds. No murmur heard. Pulmonary:     Effort: Pulmonary effort is normal. No respiratory distress.     Breath sounds: Normal breath  sounds.  Abdominal:     Palpations: Abdomen is soft.     Tenderness: There is generalized abdominal tenderness.  Musculoskeletal:        General: No swelling.     Cervical back: Neck supple.  Skin:    General: Skin is warm and dry.     Capillary Refill: Capillary refill takes less than 2 seconds.  Neurological:     Mental Status: She is alert.  Psychiatric:        Mood and Affect: Mood normal.     ED Results / Procedures / Treatments   Labs (all labs ordered are listed, but only abnormal results are displayed) Labs Reviewed  RESP PANEL BY RT-PCR (RSV, FLU A&B, COVID)  [id number]  CBC WITH DIFFERENTIAL/PLATELET  COMPREHENSIVE METABOLIC PANEL  LIPASE, BLOOD  URINALYSIS, ROUTINE W REFLEX MICROSCOPIC  PREGNANCY, URINE  BLOOD GAS, VENOUS    EKG None  Radiology No results found.  Procedures Procedures    Medications Ordered in ED Medications  lactated ringers bolus 1,000 mL (has no administration in time range)  diphenhydrAMINE (BENADRYL) injection 25 mg (has no administration in time range)  haloperidol lactate (HALDOL) injection 5 mg (has no administration in time range)    ED Course/ Medical Decision Making/ A&P                             Medical Decision Making Amount and/or Complexity of Data Reviewed Labs: ordered. Radiology: ordered.  Risk Prescription drug management.   [name] is here with nausea vomiting abdominal pain.  History of DKA, gastroparesis.  Patient with normal vitals.  No fever.  Overall ill-appearing.  My suspicion is differential diagnosis is DKA versus diabetic gastroparesis, seems less likely to be intra-abdominal process such as appendicitis or bowel obstruction.  Could be UTI.  Denies any alcohol or drug use.  Could be viral illness.  Will check CBC, CMP, lipase, blood gas, ketones, urinalysis and CT scan abdomen pelvis.  Will give IV fluids, IVOn IV Benadryl and reevaluate.  Per my review and interpretation of labs she  is not in DKA.  No significant anemia or electrolyte abnormality.  COVID test is positive.  CT scan abdomen pelvis per radiology report with no acute findings.  Pregnancy test negative.  Overall she is feeling much better after IV fluids and IV Haldol.  Will prescribe her antiviral and Phenergan.  Discharged in good condition.  This chart was dictated using voice recognition software.  Despite best efforts to proofread,  errors can occur which can change the documentation meaning.  Final Clinical Impression(s) / ED Diagnoses Final diagnoses:  None    Rx / DC Orders ED Discharge Orders     None         Lennice Sites, DO 09/27/22 1037

## 2022-09-27 NOTE — ED Triage Notes (Signed)
  Patient BIB EMS for abdominal with N/V that has been going on for the last day.  Patient seen several times recently for gastroparesis and DKA.  Patient endorses 10/10 sharp abdominal pain.  EMS gave 4 mg zofran and 200 ml NS.

## 2022-10-01 ENCOUNTER — Telehealth: Payer: Self-pay | Admitting: *Deleted

## 2022-10-01 NOTE — Transitions of Care (Post Inpatient/ED Visit) (Signed)
   10/01/2022  Name: [name] MRN: [id number] DOB: [birth year]  Today's TOC FU Call Status: Today's TOC FU Call Status:: Unsuccessul Call (1st Attempt) Unsuccessful Call (1st Attempt) Date: 10/01/22  Attempted to reach the patient regarding the most recent Inpatient/ED visit.  Follow Up Plan: Additional outreach attempts will be made to reach the patient to complete the Transitions of Care (Post Inpatient/ED visit) call.   Signature: Hildred Alamin, RN

## 2022-12-29 ENCOUNTER — Encounter (HOSPITAL_BASED_OUTPATIENT_CLINIC_OR_DEPARTMENT_OTHER): Payer: Self-pay | Admitting: *Deleted

## 2022-12-29 ENCOUNTER — Observation Stay (HOSPITAL_BASED_OUTPATIENT_CLINIC_OR_DEPARTMENT_OTHER)
Admission: EM | Admit: 2022-12-29 | Discharge: 2022-12-30 | Disposition: A | Discharge status: Home / Self Care | Payer: BC Managed Care – PPO | Attending: Internal Medicine | Admitting: Internal Medicine

## 2022-12-29 ENCOUNTER — Other Ambulatory Visit: Payer: Self-pay

## 2022-12-29 DIAGNOSIS — Z794 Long term (current) use of insulin: Secondary | ICD-10-CM | POA: Insufficient documentation

## 2022-12-29 DIAGNOSIS — Z79899 Other long term (current) drug therapy: Secondary | ICD-10-CM | POA: Diagnosis not present

## 2022-12-29 DIAGNOSIS — R112 Nausea with vomiting, unspecified: Secondary | ICD-10-CM | POA: Diagnosis present

## 2022-12-29 DIAGNOSIS — E1110 Type 2 diabetes mellitus with ketoacidosis without coma: Secondary | ICD-10-CM | POA: Diagnosis present

## 2022-12-29 DIAGNOSIS — E1065 Type 1 diabetes mellitus with hyperglycemia: Secondary | ICD-10-CM | POA: Diagnosis present

## 2022-12-29 DIAGNOSIS — Z91199 Patient's noncompliance with other medical treatment and regimen due to unspecified reason: Secondary | ICD-10-CM

## 2022-12-29 DIAGNOSIS — K2100 Gastro-esophageal reflux disease with esophagitis, without bleeding: Secondary | ICD-10-CM | POA: Diagnosis present

## 2022-12-29 DIAGNOSIS — K219 Gastro-esophageal reflux disease without esophagitis: Secondary | ICD-10-CM | POA: Insufficient documentation

## 2022-12-29 DIAGNOSIS — E1010 Type 1 diabetes mellitus with ketoacidosis without coma: Secondary | ICD-10-CM | POA: Diagnosis not present

## 2022-12-29 DIAGNOSIS — E1042 Type 1 diabetes mellitus with diabetic polyneuropathy: Secondary | ICD-10-CM | POA: Diagnosis not present

## 2022-12-29 LAB — CBG MONITORING, ED
Glucose-Capillary: >600 mg/dL (ref 70–99)
Glucose-Capillary: 437 mg/dL — ABNORMAL HIGH (ref 70–99)
Glucose-Capillary: 469 mg/dL — ABNORMAL HIGH (ref 70–99)
Glucose-Capillary: 381 mg/dL — ABNORMAL HIGH (ref 70–99)
Glucose-Capillary: 329 mg/dL — ABNORMAL HIGH (ref 70–99)
Glucose-Capillary: 261 mg/dL — ABNORMAL HIGH (ref 70–99)
Glucose-Capillary: 205 mg/dL — ABNORMAL HIGH (ref 70–99)

## 2022-12-29 LAB — BASIC METABOLIC PANEL
Anion gap: 23 — ABNORMAL HIGH (ref 5–15)
Anion gap: 17 — ABNORMAL HIGH (ref 5–15)
Anion gap: 10 (ref 5–15)
Anion gap: 7 (ref 5–15)
Anion gap: 9 (ref 5–15)
Anion gap: 9 (ref 5–15)
BUN: 27 mg/dL — ABNORMAL HIGH (ref 6–20)
BUN: 24 mg/dL — ABNORMAL HIGH (ref 6–20)
BUN: 21 mg/dL — ABNORMAL HIGH (ref 6–20)
BUN: 18 mg/dL (ref 6–20)
BUN: 18 mg/dL (ref 6–20)
BUN: 14 mg/dL (ref 6–20)
CO2: 22 mmol/L (ref 22–32)
CO2: 22 mmol/L (ref 22–32)
CO2: 28 mmol/L (ref 22–32)
CO2: 30 mmol/L (ref 22–32)
CO2: 29 mmol/L (ref 22–32)
CO2: 27 mmol/L (ref 22–32)
Calcium: 10 mg/dL (ref 8.9–10.3)
Calcium: 8.7 mg/dL — ABNORMAL LOW (ref 8.9–10.3)
Calcium: 8.6 mg/dL — ABNORMAL LOW (ref 8.9–10.3)
Calcium: 8.3 mg/dL — ABNORMAL LOW (ref 8.9–10.3)
Calcium: 8.5 mg/dL — ABNORMAL LOW (ref 8.9–10.3)
Calcium: 8.4 mg/dL — ABNORMAL LOW (ref 8.9–10.3)
Chloride: 82 mmol/L — ABNORMAL LOW (ref 98–111)
Chloride: 93 mmol/L — ABNORMAL LOW (ref 98–111)
Chloride: 98 mmol/L (ref 98–111)
Chloride: 100 mmol/L (ref 98–111)
Chloride: 100 mmol/L (ref 98–111)
Chloride: 98 mmol/L (ref 98–111)
Creatinine, Ser: 1.13 mg/dL — ABNORMAL HIGH (ref 0.44–1.00)
Creatinine, Ser: 0.88 mg/dL (ref 0.44–1.00)
Creatinine, Ser: 0.9 mg/dL (ref 0.44–1.00)
Creatinine, Ser: 0.77 mg/dL (ref 0.44–1.00)
Creatinine, Ser: 0.82 mg/dL (ref 0.44–1.00)
Creatinine, Ser: 0.87 mg/dL (ref 0.44–1.00)
GFR, Estimated: >60 mL/min (ref >60)
GFR, Estimated: >60 mL/min (ref >60)
GFR, Estimated: >60 mL/min (ref >60)
GFR, Estimated: >60 mL/min (ref >60)
GFR, Estimated: >60 mL/min (ref >60)
GFR, Estimated: >60 mL/min (ref >60)
Glucose, Bld: 629 mg/dL (ref 70–99)
Glucose, Bld: 507 mg/dL (ref 70–99)
Glucose, Bld: 227 mg/dL — ABNORMAL HIGH (ref 70–99)
Glucose, Bld: 189 mg/dL — ABNORMAL HIGH (ref 70–99)
Glucose, Bld: 223 mg/dL — ABNORMAL HIGH (ref 70–99)
Glucose, Bld: 271 mg/dL — ABNORMAL HIGH (ref 70–99)
Potassium: 5.2 mmol/L — ABNORMAL HIGH (ref 3.5–5.1)
Potassium: 4.5 mmol/L (ref 3.5–5.1)
Potassium: 3.9 mmol/L (ref 3.5–5.1)
Potassium: 3.5 mmol/L (ref 3.5–5.1)
Potassium: 3.8 mmol/L (ref 3.5–5.1)
Potassium: 3.8 mmol/L (ref 3.5–5.1)
Sodium: 127 mmol/L — ABNORMAL LOW (ref 135–145)
Sodium: 132 mmol/L — ABNORMAL LOW (ref 135–145)
Sodium: 136 mmol/L (ref 135–145)
Sodium: 137 mmol/L (ref 135–145)
Sodium: 138 mmol/L (ref 135–145)
Sodium: 134 mmol/L — ABNORMAL LOW (ref 135–145)

## 2022-12-29 LAB — I-STAT VENOUS BLOOD GAS, ED
Acid-Base Excess: 5 mmol/L — ABNORMAL HIGH (ref 0.0–2.0)
Bicarbonate: 23.3 mmol/L (ref 20.0–28.0)
Calcium, Ion: 1.08 mmol/L — ABNORMAL LOW (ref 1.15–1.40)
HCT: 43 % (ref 36.0–46.0)
Hemoglobin: 14.6 g/dL (ref 12.0–15.0)
O2 Saturation: 82 %
Patient temperature: 98.2
Potassium: 4.9 mmol/L (ref 3.5–5.1)
Sodium: 126 mmol/L — ABNORMAL LOW (ref 135–145)
TCO2: 24 mmol/L (ref 22–32)
pCO2, Ven: 20.3 mmHg — ABNORMAL LOW (ref 44–60)
pH, Ven: 7.668 (ref 7.25–7.43)
pO2, Ven: 34 mmHg (ref 32–45)

## 2022-12-29 LAB — CBC
HCT: 38.9 % (ref 36.0–46.0)
Hemoglobin: 13.7 g/dL (ref 12.0–15.0)
MCH: 30.4 pg (ref 26.0–34.0)
MCHC: 35.2 g/dL (ref 30.0–36.0)
MCV: 86.4 fL (ref 80.0–100.0)
Platelets: 343 10*3/uL (ref 150–400)
RBC: 4.5 MIL/uL (ref 3.87–5.11)
RDW: 13.4 % (ref 11.5–15.5)
WBC: 10.8 10*3/uL — ABNORMAL HIGH (ref 4.0–10.5)
nRBC: 0 % (ref 0.0–0.2)

## 2022-12-29 LAB — GLUCOSE, CAPILLARY
Glucose-Capillary: 210 mg/dL — ABNORMAL HIGH (ref 70–99)
Glucose-Capillary: 191 mg/dL — ABNORMAL HIGH (ref 70–99)
Glucose-Capillary: 191 mg/dL — ABNORMAL HIGH (ref 70–99)
Glucose-Capillary: 174 mg/dL — ABNORMAL HIGH (ref 70–99)
Glucose-Capillary: 124 mg/dL — ABNORMAL HIGH (ref 70–99)
Glucose-Capillary: 169 mg/dL — ABNORMAL HIGH (ref 70–99)

## 2022-12-29 LAB — URINALYSIS, ROUTINE W REFLEX MICROSCOPIC
Bilirubin Urine: NEGATIVE
Glucose, UA: >=500 mg/dL — AB
Hgb urine dipstick: NEGATIVE
Ketones, ur: 20 mg/dL — AB
Leukocytes,Ua: NEGATIVE
Nitrite: NEGATIVE
Protein, ur: 30 mg/dL — AB
Specific Gravity, Urine: 1.025 (ref 1.005–1.030)
pH: 6 (ref 5.0–8.0)

## 2022-12-29 LAB — PREGNANCY, URINE: Preg Test, Ur: NEGATIVE

## 2022-12-29 LAB — MRSA NEXT GEN BY PCR, NASAL: MRSA by PCR Next Gen: NOT DETECTED

## 2022-12-29 LAB — BETA-HYDROXYBUTYRIC ACID: Beta-Hydroxybutyric Acid: 5.29 mmol/L — ABNORMAL HIGH (ref 0.05–0.27)

## 2022-12-29 MED ORDER — SODIUM CHLORIDE 0.9 % IV SOLN
25.0000 mg | Freq: Once | INTRAVENOUS | Status: AC
  Administered 2022-12-29: 25 mg via INTRAVENOUS
  Filled 2022-12-29: qty 1

## 2022-12-29 MED ORDER — POLYETHYLENE GLYCOL 3350 17 G PO PACK: 17.0000 g | PACK | Freq: Every day | ORAL | Status: DC | PRN

## 2022-12-29 MED ORDER — INSULIN DETEMIR 100 UNIT/ML ~~LOC~~ SOLN
20.0000 [IU] | Freq: Every day | SUBCUTANEOUS | Status: DC
  Filled 2022-12-29: qty 0.2

## 2022-12-29 MED ORDER — PANTOPRAZOLE SODIUM 40 MG PO TBEC
40.0000 mg | DELAYED_RELEASE_TABLET | Freq: Every day | ORAL | Status: DC
  Administered 2022-12-29 – 2022-12-30 (×2): 40 mg via ORAL
  Filled 2022-12-29 (×2): qty 1

## 2022-12-29 MED ORDER — ONDANSETRON HCL 4 MG PO TABS: 4.0000 mg | ORAL_TABLET | Freq: Four times a day (QID) | ORAL | Status: DC | PRN

## 2022-12-29 MED ORDER — ORAL CARE MOUTH RINSE: 15.0000 mL | OROMUCOSAL | Status: DC | PRN

## 2022-12-29 MED ORDER — LACTATED RINGERS IV BOLUS
20.0000 mL/kg | Freq: Once | INTRAVENOUS | Status: AC
  Administered 2022-12-29: 1588 mL via INTRAVENOUS

## 2022-12-29 MED ORDER — ACETAMINOPHEN 325 MG PO TABS: 650.0000 mg | ORAL_TABLET | Freq: Four times a day (QID) | ORAL | Status: DC | PRN

## 2022-12-29 MED ORDER — AMITRIPTYLINE HCL 25 MG PO TABS
25.0000 mg | ORAL_TABLET | Freq: Every day | ORAL | Status: DC
  Administered 2022-12-29: 25 mg via ORAL
  Filled 2022-12-29: qty 1

## 2022-12-29 MED ORDER — DEXTROSE 50 % IV SOLN: 0.0000 mL | INTRAVENOUS | Status: DC | PRN

## 2022-12-29 MED ORDER — DIPHENHYDRAMINE HCL 50 MG/ML IJ SOLN
25.0000 mg | Freq: Once | INTRAMUSCULAR | Status: AC
  Administered 2022-12-29: 25 mg via INTRAVENOUS
  Filled 2022-12-29: qty 1

## 2022-12-29 MED ORDER — POTASSIUM CHLORIDE 10 MEQ/100ML IV SOLN
10.0000 meq | INTRAVENOUS | Status: DC
  Filled 2022-12-29: qty 100

## 2022-12-29 MED ORDER — DROPERIDOL 2.5 MG/ML IJ SOLN
1.2500 mg | Freq: Once | INTRAMUSCULAR | Status: AC
  Administered 2022-12-29: 1.25 mg via INTRAVENOUS
  Filled 2022-12-29: qty 2

## 2022-12-29 MED ORDER — SODIUM CHLORIDE 0.9 % IV BOLUS
1000.0000 mL | Freq: Once | INTRAVENOUS | Status: AC
  Administered 2022-12-29: 1000 mL via INTRAVENOUS

## 2022-12-29 MED ORDER — PROMETHAZINE HCL 25 MG/ML IJ SOLN
INTRAMUSCULAR | Status: AC
  Filled 2022-12-29: qty 1

## 2022-12-29 MED ORDER — ENOXAPARIN SODIUM 40 MG/0.4ML IJ SOSY
40.0000 mg | PREFILLED_SYRINGE | INTRAMUSCULAR | Status: DC
  Administered 2022-12-29: 40 mg via SUBCUTANEOUS
  Filled 2022-12-29: qty 0.4

## 2022-12-29 MED ORDER — INSULIN ASPART 100 UNIT/ML IJ SOLN
0.0000 [IU] | Freq: Every day | INTRAMUSCULAR | Status: DC
  Administered 2022-12-29: 2 [IU] via SUBCUTANEOUS

## 2022-12-29 MED ORDER — INSULIN DETEMIR 100 UNIT/ML ~~LOC~~ SOLN
20.0000 [IU] | Freq: Every day | SUBCUTANEOUS | Status: DC
  Administered 2022-12-29 – 2022-12-30 (×2): 20 [IU] via SUBCUTANEOUS
  Filled 2022-12-29 (×2): qty 0.2

## 2022-12-29 MED ORDER — INSULIN REGULAR(HUMAN) IN NACL 100-0.9 UT/100ML-% IV SOLN
INTRAVENOUS | Status: DC
  Administered 2022-12-29: 9.5 [IU]/h via INTRAVENOUS
  Filled 2022-12-29: qty 100

## 2022-12-29 MED ORDER — DOCUSATE SODIUM 100 MG PO CAPS
100.0000 mg | ORAL_CAPSULE | Freq: Two times a day (BID) | ORAL | Status: DC
  Filled 2022-12-29 (×2): qty 1

## 2022-12-29 MED ORDER — DULOXETINE HCL 60 MG PO CPEP
60.0000 mg | ORAL_CAPSULE | Freq: Every day | ORAL | Status: DC
  Administered 2022-12-29 – 2022-12-30 (×2): 60 mg via ORAL
  Filled 2022-12-29 (×2): qty 2
  Filled 2022-12-29 (×2): qty 1

## 2022-12-29 MED ORDER — ONDANSETRON HCL 4 MG/2ML IJ SOLN
4.0000 mg | Freq: Four times a day (QID) | INTRAMUSCULAR | Status: DC | PRN
  Administered 2022-12-29 – 2022-12-30 (×4): 4 mg via INTRAVENOUS
  Filled 2022-12-29 (×4): qty 2

## 2022-12-29 MED ORDER — METOPROLOL TARTRATE 5 MG/5ML IV SOLN: 5.0000 mg | Freq: Four times a day (QID) | INTRAVENOUS | Status: DC | PRN

## 2022-12-29 MED ORDER — TRAZODONE HCL 50 MG PO TABS: 100.0000 mg | ORAL_TABLET | Freq: Every day | ORAL | Status: DC

## 2022-12-29 MED ORDER — LACTATED RINGERS IV SOLN
INTRAVENOUS | Status: DC
  Administered 2022-12-29: 1000 mL via INTRAVENOUS

## 2022-12-29 MED ORDER — ALBUTEROL SULFATE (2.5 MG/3ML) 0.083% IN NEBU: 2.5000 mg | INHALATION_SOLUTION | RESPIRATORY_TRACT | Status: DC | PRN

## 2022-12-29 MED ORDER — MORPHINE SULFATE (PF) 2 MG/ML IV SOLN
2.0000 mg | INTRAVENOUS | Status: DC | PRN
  Administered 2022-12-29 – 2022-12-30 (×4): 2 mg via INTRAVENOUS
  Filled 2022-12-29 (×4): qty 1

## 2022-12-29 MED ORDER — OXYCODONE HCL 5 MG PO TABS
5.0000 mg | ORAL_TABLET | ORAL | Status: DC | PRN
  Administered 2022-12-29 (×3): 5 mg via ORAL
  Filled 2022-12-29 (×3): qty 1

## 2022-12-29 MED ORDER — ACETAMINOPHEN 650 MG RE SUPP: 650.0000 mg | Freq: Four times a day (QID) | RECTAL | Status: DC | PRN

## 2022-12-29 MED ORDER — CHLORHEXIDINE GLUCONATE CLOTH 2 % EX PADS: 6.0000 | MEDICATED_PAD | Freq: Every day | CUTANEOUS | Status: DC

## 2022-12-29 MED ORDER — ONDANSETRON HCL 4 MG/2ML IJ SOLN
4.0000 mg | Freq: Once | INTRAMUSCULAR | Status: AC
  Administered 2022-12-29: 4 mg via INTRAVENOUS
  Filled 2022-12-29: qty 2

## 2022-12-29 MED ORDER — METOCLOPRAMIDE HCL 10 MG PO TABS: 10.0000 mg | ORAL_TABLET | Freq: Four times a day (QID) | ORAL | Status: DC | PRN

## 2022-12-29 MED ORDER — METOCLOPRAMIDE HCL 5 MG/ML IJ SOLN
10.0000 mg | Freq: Four times a day (QID) | INTRAMUSCULAR | Status: DC | PRN
  Administered 2022-12-29: 10 mg via INTRAVENOUS
  Filled 2022-12-29: qty 2

## 2022-12-29 MED ORDER — DEXTROSE IN LACTATED RINGERS 5 % IV SOLN: INTRAVENOUS | Status: DC

## 2022-12-29 MED ORDER — INSULIN ASPART 100 UNIT/ML IJ SOLN
0.0000 [IU] | Freq: Three times a day (TID) | INTRAMUSCULAR | Status: DC
  Administered 2022-12-29: 3 [IU] via SUBCUTANEOUS
  Administered 2022-12-30: 4 [IU] via SUBCUTANEOUS
  Administered 2022-12-30: 3 [IU] via SUBCUTANEOUS

## 2022-12-29 MED ORDER — INSULIN DETEMIR 100 UNIT/ML FLEXPEN: 20.0000 [IU] | PEN_INJECTOR | Freq: Every day | SUBCUTANEOUS | Status: DC

## 2022-12-29 MED ORDER — MORPHINE SULFATE (PF) 4 MG/ML IV SOLN
4.0000 mg | Freq: Once | INTRAVENOUS | Status: AC
  Administered 2022-12-29: 4 mg via INTRAVENOUS
  Filled 2022-12-29: qty 1

## 2022-12-29 NOTE — ED Triage Notes (Signed)
Pt is a type 1 diabetic and states that her CBG has been both high and low during the past two days (as low as 50 and as high as 200) pt reports that she feels like she will faint.  Pt has been having nausea and vomiting

## 2022-12-29 NOTE — ED Notes (Signed)
CRITICAL VALUE STICKER  CRITICAL VALUE: glucose  RECEIVER (on-site recipient of call): Alvino Chapel   DATE & TIME NOTIFIED: 3:00 AM   MESSENGER (representative from lab):Myriam Jacobson  MD NOTIFIED: Dr. Judd Lien by direct message  TIME OF NOTIFICATION: 3:00 AM   RESPONSE:

## 2022-12-29 NOTE — H&P (Signed)
History and Physical  Kerrian Panik ZOX:[id number] DOB: Aug 05, 1986 DOA: 12/29/2022  PCP: Dulce Sellar, NP   Chief Complaint: Abdominal pain, nausea vomiting  HPI: [name] is a 37 y.o. female with medical history significant for insulin-dependent type 1 diabetes, cyclic vomiting syndrome on chronic narcotics being admitted to the hospital with recurrent nausea vomiting and diabetic ketoacidosis.  Patient states she has been compliant with her insulin sugars have been running as high as 500 over the last few days.  Her pain and nausea really got worse within the last 48 hours.  Denies any fevers, cough, shortness of breath, chest pain.  States that she has burning pain all over, which is similar to her usual diabetic neuropathy for which she takes Lyrica.  She does not take gabapentin anymore.  ED Course: At [facility], she was tachycardic she was hemodynamically stable, satting normally on room air.  Initial lab work showed blood glucose 629, sodium 127, creatinine 1.13 with anion gap of 23.  Patient was started on IV insulin drip as well as IV fluids, more recent BMP shows improved sodium 132, blood glucose is improved to 205, and anion gap is 17.  White blood cell count 10.8.  Review of Systems: Please see HPI for pertinent positives and negatives. A complete 10 system review of systems are otherwise negative.  Past Medical History:  Diagnosis Date   Chronic constipation 11/08/2021   Diabetic neuropathy (HCC)    History of chicken pox    History of noncompliance with medical treatment 09/07/2022   Type 1 diabetes (HCC)    Vaginal odor 11/08/2021   Past Surgical History:  Procedure Laterality Date   CESAREAN SECTION     only one C/S per pt.   CO2 LASER APPLICATION N/A 02/22/2022   Procedure: CO2 LASER APPLICATION TO VULVA;  Surgeon: Carver Fila, MD;  Location: [facility];  Service: General;  Laterality: N/A;   DILATION AND CURETTAGE  OF UTERUS     EXCISION OF SKIN TAG N/A 02/22/2022   Procedure: EXCISION OF ANAL SKIN TAGS;  Surgeon: Romie Levee, MD;  Location: [facility];  Service: General;  Laterality: N/A;    Social History:  reports that she has never smoked. She has never been exposed to tobacco smoke. She has never used smokeless tobacco. She reports that she does not drink alcohol and does not use drugs.   Allergies  Allergen Reactions   Pramipexole Shortness Of Breath    Family History  Problem Relation Age of Onset   Hypertension Mother    Glaucoma Mother    Diabetes Maternal Grandmother    Neuropathy Neg Hx    Colon cancer Neg Hx    Stomach cancer Neg Hx    Esophageal cancer Neg Hx    Colon polyps Neg Hx    Rectal cancer Neg Hx      Prior to Admission medications   Medication Sig Start Date End Date Taking? Authorizing Provider  amitriptyline (ELAVIL) 25 MG tablet Take 25-75 mg by mouth at bedtime. 06/26/22   [provider]  Continuous Blood Gluc Transmit (DEXCOM G6 TRANSMITTER) MISC Inject 1 each into the skin every 14 (fourteen) days. 12/31/21   Dulce Sellar, NP  cyanocobalamin (VITAMIN B12) 1000 MCG/ML injection Inject into the muscle. 08/31/22   [provider]  DULoxetine (CYMBALTA) 60 MG capsule Take 60 mg by mouth daily. 06/28/22   [provider]  gabapentin (NEURONTIN) 400 MG capsule  Take 1 capsule (400 mg total) by mouth 3 (three) times daily. Patient not taking: Reported on 05/07/2022 03/10/22   Erick Blinks, MD  hydrOXYzine (ATARAX) 25 MG tablet Take 1 tablet (25 mg total) by mouth at bedtime as needed (insomnia). 06/03/22   Pollyann Savoy, MD  insulin aspart (NOVOLOG FLEXPEN) 100 UNIT/ML FlexPen Inject 15-20 Units into the skin 3 (three) times daily with meals. plus sliding scale 05/10/22 06/09/22  Joseph Art, DO  insulin detemir (LEVEMIR FLEXTOUCH) 100 UNIT/ML FlexPen 30 units daily-- will need to adjust as you intake at home  varies 05/10/22   Marlin Canary U, DO  Insulin Pen Needle (B-D UF III MINI PEN NEEDLES) 31G X 5 MM MISC To use with Insulin pens, as directed 11/20/21   Dulce Sellar, NP  Insulin Syringes, Disposable, U-100 1 ML MISC Inject 1 each into the skin in the morning and at bedtime. 08/10/21   Dulce Sellar, NP  metoCLOPramide (REGLAN) 10 MG tablet Take 1 tablet (10 mg total) by mouth every 6 (six) hours as needed for nausea or vomiting. 09/20/22   Lonell Grandchild, MD  ondansetron (ZOFRAN) 4 MG tablet Take 1 tablet (4 mg total) by mouth every 8 (eight) hours as needed for nausea or vomiting. 03/10/22   Erick Blinks, MD  Oxycodone HCl 10 MG TABS Take 10 mg by mouth 3 (three) times daily as needed. 08/07/22   [provider]  pantoprazole (PROTONIX) 40 MG tablet Take 1 tablet (40 mg total) by mouth daily. 05/10/22   Joseph Art, DO  promethazine (PHENERGAN) 25 MG tablet Take 1 tablet (25 mg total) by mouth every 6 (six) hours as needed for nausea or vomiting. 09/27/22   Curatolo, Adam, DO  rosuvastatin (CRESTOR) 10 MG tablet Take 10 mg by mouth daily. 07/04/22   [provider]  topiramate (TOPAMAX) 50 MG tablet Take by mouth. 04/29/22   [provider]  traZODone (DESYREL) 100 MG tablet Take 100 mg by mouth at bedtime. 03/18/22   [provider]    Physical Exam: BP (!) 130/91 (BP Location: Left Arm)   Pulse (!) 120   Temp 98 F (36.7 C) (Oral)   Resp 16   Wt 79.4 kg   LMP 12/23/2022   SpO2 99%   BMI 28.25 kg/m   General:  Alert, oriented, calm, in no acute distress  Eyes: EOMI, clear conjuctivae, white sclerea Neck: supple, no masses, trachea mildline  Cardiovascular: RRR, no murmurs or rubs, no peripheral edema  Respiratory: clear to auscultation bilaterally, no wheezes, no crackles  Abdomen: soft, tender, nondistended, normal bowel tones heard  Skin: dry, no rashes  Musculoskeletal: no joint effusions, normal range of motion  Psychiatric:  appropriate affect, normal speech  Neurologic: extraocular muscles intact, clear speech, moving all extremities with intact sensorium          Labs on Admission:  Basic Metabolic Panel: Recent Labs  Lab 12/29/22 0142 12/29/22 0153 12/29/22 0346  NA 127* 126* 132*  K 5.2* 4.9 4.5  CL 82*  --  93*  CO2 22  --  22  GLUCOSE 629*  --  507*  BUN 27*  --  24*  CREATININE 1.13*  --  0.88  CALCIUM 10.0  --  8.7*   Liver Function Tests: No results for input(s): "AST", "ALT", "ALKPHOS", "BILITOT", "PROT", "ALBUMIN" in the last 168 hours. No results for input(s): "LIPASE", "AMYLASE" in the last 168 hours. No results for input(s): "AMMONIA" in  the last 168 hours. CBC: Recent Labs  Lab 12/29/22 0142 12/29/22 0153  WBC 10.8*  --   HGB 13.7 14.6  HCT 38.9 43.0  MCV 86.4  --   PLT 343  --    Cardiac Enzymes: No results for input(s): "CKTOTAL", "CKMB", "CKMBINDEX", "TROPONINI" in the last 168 hours.  BNP (last 3 results) No results for input(s): "BNP" in the last 8760 hours.  ProBNP (last 3 results) No results for input(s): "PROBNP" in the last 8760 hours.  CBG: Recent Labs  Lab 12/29/22 0419 12/29/22 0451 12/29/22 0552 12/29/22 0659 12/29/22 0757  GLUCAP 437* 381* 329* 261* 205*    Radiological Exams on Admission: No results found.  Assessment/Plan This is a 37 year old female with a history of cyclic vomiting syndrome, type 1 diabetes admitted to the hospital with recurrent nausea vomiting abdominal pain and diabetic ketoacidosis.  She has attendant hyponatremia, acute renal failure anion gap metabolic acidosis which are already improving.   DKA (diabetic ketoacidosis) (HCC) -Observation admission to stepdown unit -N.p.o. -Hold home diabetic medications -Continue IV insulin drip, with every 4 hours BMP -Will discontinue IV drip and start basal bolus insulin dosing once anion gap is closed x 2    Diabetic polyneuropathy associated with type 1 diabetes mellitus  (HCC) -Continue home Lyrica    Non compliance with medical treatment   Gastroesophageal reflux disease with esophagitis without hemorrhage-continue oral PPI   DVT prophylaxis: Lovenox     Code Status: Full Code  Consults called: None  Admission status: The appropriate patient status for this patient is INPATIENT. Inpatient status is judged to be reasonable and necessary in order to provide the required intensity of service to ensure the patient's safety. The patient's presenting symptoms, physical exam findings, and initial radiographic and laboratory data in the context of their chronic comorbidities is felt to place them at high risk for further clinical deterioration. Furthermore, it is not anticipated that the patient will be medically stable for discharge from the hospital within 2 midnights of admission.    I certify that at the point of admission it is my clinical judgment that the patient will require inpatient hospital care spanning beyond 2 midnights from the point of admission due to high intensity of service, high risk for further deterioration and high frequency of surveillance required   Time spent: 56 minutes  Xan Sparkman Sharlette Dense MD [facility] Pager [phone]  If 7PM-7AM, please contact night-coverage [email or web address] Password [facility]  12/29/2022, 8:35 AM

## 2022-12-29 NOTE — ED Notes (Signed)
CBG 329, CBG not crossing over into EPIC. CBG entered into Endotool.

## 2022-12-29 NOTE — ED Notes (Signed)
RT placed PIV in Left anterior forearm. Flushed, saline locked, secured.

## 2022-12-29 NOTE — ED Notes (Signed)
Per discussion with EDP the LR bolus was stopped (d/c) as pt already had 2l NS.

## 2022-12-29 NOTE — ED Notes (Signed)
CBG 437.  Result entered to Endo tool.   CBG not crossing over from meter to chart at this time.  See MAR and notes for CBG results

## 2022-12-29 NOTE — ED Provider Notes (Signed)
[place] EMERGENCY DEPARTMENT AT [facility] Provider Note   CSN: [id number] Arrival date & time: 12/29/22  0120     History  Chief Complaint  Patient presents with   Hyperglycemia    [name] is a 37 y.o. female.  Patient is a 37 year old female with past medical history of type 1 diabetes.  Patient presenting today with complaints of nausea, vomiting, and elevated blood sugar.  She feels lightheaded and as if she is going to pass out.  No fevers or chills.  No aggravating or alleviating factors.  She reports that she has been compliant with her insulin and that sugars have been running anywhere between 205 100 over the past few days.  The history is provided by the patient.       Home Medications Prior to Admission medications   Medication Sig Start Date End Date Taking? Authorizing Provider  amitriptyline (ELAVIL) 25 MG tablet Take 25-75 mg by mouth at bedtime. 06/26/22   [provider]  Continuous Blood Gluc Transmit (DEXCOM G6 TRANSMITTER) MISC Inject 1 each into the skin every 14 (fourteen) days. 12/31/21   Dulce Sellar, NP  cyanocobalamin (VITAMIN B12) 1000 MCG/ML injection Inject into the muscle. 08/31/22   [provider]  DULoxetine (CYMBALTA) 60 MG capsule Take 60 mg by mouth daily. 06/28/22   [provider]  gabapentin (NEURONTIN) 400 MG capsule Take 1 capsule (400 mg total) by mouth 3 (three) times daily. Patient not taking: Reported on 05/07/2022 03/10/22   Erick Blinks, MD  hydrOXYzine (ATARAX) 25 MG tablet Take 1 tablet (25 mg total) by mouth at bedtime as needed (insomnia). 06/03/22   Pollyann Savoy, MD  insulin aspart (NOVOLOG FLEXPEN) 100 UNIT/ML FlexPen Inject 15-20 Units into the skin 3 (three) times daily with meals. plus sliding scale 05/10/22 06/09/22  Joseph Art, DO  insulin detemir (LEVEMIR FLEXTOUCH) 100 UNIT/ML FlexPen 30 units daily-- will need to adjust as you intake at home varies  05/10/22   Marlin Canary U, DO  Insulin Pen Needle (B-D UF III MINI PEN NEEDLES) 31G X 5 MM MISC To use with Insulin pens, as directed 11/20/21   Dulce Sellar, NP  Insulin Syringes, Disposable, U-100 1 ML MISC Inject 1 each into the skin in the morning and at bedtime. 08/10/21   Dulce Sellar, NP  metoCLOPramide (REGLAN) 10 MG tablet Take 1 tablet (10 mg total) by mouth every 6 (six) hours as needed for nausea or vomiting. 09/20/22   Lonell Grandchild, MD  ondansetron (ZOFRAN) 4 MG tablet Take 1 tablet (4 mg total) by mouth every 8 (eight) hours as needed for nausea or vomiting. 03/10/22   Erick Blinks, MD  Oxycodone HCl 10 MG TABS Take 10 mg by mouth 3 (three) times daily as needed. 08/07/22   [provider]  pantoprazole (PROTONIX) 40 MG tablet Take 1 tablet (40 mg total) by mouth daily. 05/10/22   Joseph Art, DO  promethazine (PHENERGAN) 25 MG tablet Take 1 tablet (25 mg total) by mouth every 6 (six) hours as needed for nausea or vomiting. 09/27/22   Curatolo, Adam, DO  rosuvastatin (CRESTOR) 10 MG tablet Take 10 mg by mouth daily. 07/04/22   [provider]  topiramate (TOPAMAX) 50 MG tablet Take by mouth. 04/29/22   [provider]  traZODone (DESYREL) 100 MG tablet Take 100 mg by mouth at bedtime. 03/18/22   [provider]      Allergies  Pramipexole    Review of Systems   Review of Systems  All other systems reviewed and are negative.   Physical Exam Updated Vital Signs BP 131/69   Pulse (!) 125   Temp 98.6 F (37 C) (Oral)   Resp (!) 24   Wt 79.4 kg   LMP 12/23/2022   SpO2 100%   BMI 28.25 kg/m  Physical Exam Vitals and nursing note reviewed.  Constitutional:      General: She is not in acute distress.    Appearance: She is well-developed. She is not diaphoretic.  HENT:     Head: Normocephalic and atraumatic.     Mouth/Throat:     Comments: Mucous membranes appear dry. Cardiovascular:     Rate and Rhythm: Normal rate  and regular rhythm.     Heart sounds: No murmur heard.    No friction rub. No gallop.  Pulmonary:     Effort: Pulmonary effort is normal. No respiratory distress.     Breath sounds: Normal breath sounds. No wheezing.  Abdominal:     General: Bowel sounds are normal. There is no distension.     Palpations: Abdomen is soft.     Tenderness: There is no abdominal tenderness.  Musculoskeletal:        General: Normal range of motion.     Cervical back: Normal range of motion and neck supple.  Skin:    General: Skin is warm and dry.  Neurological:     General: No focal deficit present.     Mental Status: She is alert and oriented to person, place, and time.     ED Results / Procedures / Treatments   Labs (all labs ordered are listed, but only abnormal results are displayed) Labs Reviewed  I-STAT VENOUS BLOOD GAS, ED - Abnormal; Notable for the following components:      Result Value   pH, Ven 7.668 (*)    pCO2, Ven 20.3 (*)    Acid-Base Excess 5.0 (*)    Sodium 126 (*)    Calcium, Ion 1.08 (*)    All other components within normal limits  BASIC METABOLIC PANEL  CBC  URINALYSIS, ROUTINE W REFLEX MICROSCOPIC  PREGNANCY, URINE  BETA-HYDROXYBUTYRIC ACID  CBG MONITORING, ED    EKG None  Radiology No results found.  Procedures Procedures    Medications Ordered in ED Medications  sodium chloride 0.9 % bolus 1,000 mL (has no administration in time range)  morphine (PF) 4 MG/ML injection 4 mg (has no administration in time range)  ondansetron (ZOFRAN) injection 4 mg (has no administration in time range)  sodium chloride 0.9 % bolus 1,000 mL (1,000 mLs Intravenous New Bag/Given 12/29/22 0156)    ED Course/ Medical Decision Making/ A&P  Patient is a 37 year old female with history of type 1 diabetes.  Patient presenting today with complaints of hurting all over, nausea, vomiting, and being unable to keep anything down.  She has had DKA in the past and this feels similar.   Patient arrives here tachycardic, pale, and unwell appearing.  She is afebrile with no hypoxia.  Workup initiated including CBC, basic metabolic panel, and beta hydroxybutyric acid level.  She has returned with a glucose of 629, anion gap of 23, but CO2 of 22.  Patient has been hydrated with normal saline and has been started on an insulin drip.  She has received Zofran and Phenergan for her nausea as well as 1 dose of morphine for generalized pain.  I have  discussed care with Dr. Julian Reil from the hospitalist service who agrees to admit.  CRITICAL CARE Performed by: Geoffery Lyons Total critical care time: 40 minutes Critical care time was exclusive of separately billable procedures and treating other patients. Critical care was necessary to treat or prevent imminent or life-threatening deterioration. Critical care was time spent personally by me on the following activities: development of treatment plan with patient and/or surrogate as well as nursing, discussions with consultants, evaluation of patient's response to treatment, examination of patient, obtaining history from patient or surrogate, ordering and performing treatments and interventions, ordering and review of laboratory studies, ordering and review of radiographic studies, pulse oximetry and re-evaluation of patient's condition.   Final Clinical Impression(s) / ED Diagnoses Final diagnoses:  None    Rx / DC Orders ED Discharge Orders     None         Geoffery Lyons, MD 12/29/22 [phone]

## 2022-12-29 NOTE — ED Notes (Signed)
RT ran VBG on pt with the following results. MD Delo made aware of critical values.    Latest Reference Range & Units 12/29/22 01:53  Sample type  VENOUS  pH, Ven 7.25 - 7.43  7.668 (HH)  pCO2, Ven 44 - 60 mmHg 20.3 (L)  pO2, Ven 32 - 45 mmHg 34  TCO2 22 - 32 mmol/L 24  Acid-Base Excess 0.0 - 2.0 mmol/L 5.0 (H)  Bicarbonate 20.0 - 28.0 mmol/L 23.3  O2 Saturation % 82  Patient temperature  98.2 F  Collection site  IV start

## 2022-12-29 NOTE — ED Notes (Signed)
CBG 381, CBG not crossing over for patient at this time. CBG entered into Endotool.

## 2022-12-29 NOTE — ED Notes (Signed)
CBG in triage critical high (>600).  Pt is taken to room 11.

## 2022-12-29 NOTE — ED Notes (Signed)
Patient secretary alerted this nurse that Patients O2 saturation was 84% with good pleth, the nurse went to evaluate patient. Patient placed on 2L [place], Dr. Judd Lien notified.

## 2022-12-29 NOTE — ED Notes (Addendum)
Pt CBG after 2liters NS is 469

## 2022-12-30 DIAGNOSIS — E1010 Type 1 diabetes mellitus with ketoacidosis without coma: Secondary | ICD-10-CM | POA: Diagnosis not present

## 2022-12-30 LAB — GLUCOSE, CAPILLARY
Glucose-Capillary: 213 mg/dL — ABNORMAL HIGH (ref 70–99)
Glucose-Capillary: 226 mg/dL — ABNORMAL HIGH (ref 70–99)
Glucose-Capillary: 215 mg/dL — ABNORMAL HIGH (ref 70–99)
Glucose-Capillary: 170 mg/dL — ABNORMAL HIGH (ref 70–99)
Glucose-Capillary: 238 mg/dL — ABNORMAL HIGH (ref 70–99)
Glucose-Capillary: 224 mg/dL — ABNORMAL HIGH (ref 70–99)
Glucose-Capillary: 174 mg/dL — ABNORMAL HIGH (ref 70–99)

## 2022-12-30 LAB — BASIC METABOLIC PANEL
Anion gap: 7 (ref 5–15)
Anion gap: 7 (ref 5–15)
BUN: 13 mg/dL (ref 6–20)
BUN: 11 mg/dL (ref 6–20)
CO2: 29 mmol/L (ref 22–32)
CO2: 27 mmol/L (ref 22–32)
Calcium: 8.3 mg/dL — ABNORMAL LOW (ref 8.9–10.3)
Calcium: 8 mg/dL — ABNORMAL LOW (ref 8.9–10.3)
Chloride: 99 mmol/L (ref 98–111)
Chloride: 98 mmol/L (ref 98–111)
Creatinine, Ser: 0.73 mg/dL (ref 0.44–1.00)
Creatinine, Ser: 0.74 mg/dL (ref 0.44–1.00)
GFR, Estimated: >60 mL/min (ref >60)
GFR, Estimated: >60 mL/min (ref >60)
Glucose, Bld: 202 mg/dL — ABNORMAL HIGH (ref 70–99)
Glucose, Bld: 207 mg/dL — ABNORMAL HIGH (ref 70–99)
Potassium: 3.4 mmol/L — ABNORMAL LOW (ref 3.5–5.1)
Potassium: 3.3 mmol/L — ABNORMAL LOW (ref 3.5–5.1)
Sodium: 135 mmol/L (ref 135–145)
Sodium: 132 mmol/L — ABNORMAL LOW (ref 135–145)

## 2022-12-30 LAB — CBC
HCT: 31.9 % — ABNORMAL LOW (ref 36.0–46.0)
Hemoglobin: 10.5 g/dL — ABNORMAL LOW (ref 12.0–15.0)
MCH: 30.4 pg (ref 26.0–34.0)
MCHC: 32.9 g/dL (ref 30.0–36.0)
MCV: 92.5 fL (ref 80.0–100.0)
Platelets: 277 10*3/uL (ref 150–400)
RBC: 3.45 MIL/uL — ABNORMAL LOW (ref 3.87–5.11)
RDW: 13.7 % (ref 11.5–15.5)
WBC: 8.6 10*3/uL (ref 4.0–10.5)
nRBC: 0 % (ref 0.0–0.2)

## 2022-12-30 LAB — PHOSPHORUS: Phosphorus: 2.3 mg/dL — ABNORMAL LOW (ref 2.5–4.6)

## 2022-12-30 LAB — MAGNESIUM: Magnesium: 1.8 mg/dL (ref 1.7–2.4)

## 2022-12-30 LAB — HEMOGLOBIN A1C
Hgb A1c MFr Bld: 12.2 % — ABNORMAL HIGH (ref 4.8–5.6)
Mean Plasma Glucose: 303.44 mg/dL

## 2022-12-30 MED ORDER — PEN NEEDLES 31G X 5 MM MISC
1.0000 | Freq: Three times a day (TID) | 0 refills | Status: DC
Start: 2022-12-30 — End: 2023-02-03

## 2022-12-30 MED ORDER — POTASSIUM CHLORIDE 10 MEQ/100ML IV SOLN
10.0000 meq | INTRAVENOUS | Status: AC
  Administered 2022-12-30 (×3): 10 meq via INTRAVENOUS
  Filled 2022-12-30 (×3): qty 100

## 2022-12-30 MED ORDER — PROMETHAZINE HCL 25 MG PO TABS: 25.0000 mg | ORAL_TABLET | Freq: Three times a day (TID) | ORAL | 0 refills | Status: DC | PRN

## 2022-12-30 MED ORDER — POTASSIUM CHLORIDE CRYS ER 20 MEQ PO TBCR
40.0000 meq | EXTENDED_RELEASE_TABLET | Freq: Once | ORAL | Status: AC
  Administered 2022-12-30: 40 meq via ORAL
  Filled 2022-12-30: qty 2

## 2022-12-30 MED ORDER — GUAIFENESIN 100 MG/5ML PO LIQD: 5.0000 mL | ORAL | Status: DC | PRN

## 2022-12-30 MED ORDER — INSULIN ASPART 100 UNIT/ML FLEXPEN
0.0000 [IU] | PEN_INJECTOR | Freq: Three times a day (TID) | SUBCUTANEOUS | 0 refills | Status: DC
Start: 2022-12-30 — End: 2022-12-30

## 2022-12-30 MED ORDER — HYDRALAZINE HCL 20 MG/ML IJ SOLN: 10.0000 mg | INTRAMUSCULAR | Status: DC | PRN

## 2022-12-30 MED ORDER — TRAZODONE HCL 50 MG PO TABS: 50.0000 mg | ORAL_TABLET | Freq: Every evening | ORAL | Status: DC | PRN

## 2022-12-30 MED ORDER — IPRATROPIUM-ALBUTEROL 0.5-2.5 (3) MG/3ML IN SOLN: 3.0000 mL | RESPIRATORY_TRACT | Status: DC | PRN

## 2022-12-30 MED ORDER — INSULIN ASPART 100 UNIT/ML FLEXPEN
0.0000 [IU] | PEN_INJECTOR | Freq: Three times a day (TID) | SUBCUTANEOUS | 0 refills | Status: DC
Start: 2022-12-30 — End: 2023-02-04

## 2022-12-30 MED ORDER — K PHOS MONO-SOD PHOS DI & MONO 155-852-130 MG PO TABS
500.0000 mg | ORAL_TABLET | Freq: Once | ORAL | Status: AC
  Administered 2022-12-30: 500 mg via ORAL
  Filled 2022-12-30: qty 2

## 2022-12-30 MED ORDER — SENNOSIDES-DOCUSATE SODIUM 8.6-50 MG PO TABS: 1.0000 | ORAL_TABLET | Freq: Every evening | ORAL | Status: DC | PRN

## 2022-12-30 MED ORDER — METOPROLOL TARTRATE 5 MG/5ML IV SOLN: 5.0000 mg | INTRAVENOUS | Status: DC | PRN

## 2022-12-30 NOTE — Progress Notes (Signed)
  Transition of Care [facility]) Screening Note   Patient Details  Name: Princella Craige Date of Birth: [birth year]   Transition of Care [facility]) CM/SW Contact:    Lavenia Atlas, RN Phone Number: 12/30/2022, 1:13 PM    Transition of Care Department [facility]) has reviewed patient and no TOC needs have been identified at this time. We will continue to monitor patient advancement through interdisciplinary progression rounds. If new patient transition needs arise, please place a TOC consult.

## 2022-12-30 NOTE — Inpatient Diabetes Management (Signed)
Inpatient Diabetes Program Recommendations  AACE/ADA: New Consensus Statement on Inpatient Glycemic Control (2015)  Target Ranges:  Prepandial:   less than 140 mg/dL      Peak postprandial:   less than 180 mg/dL (1-2 hours)      Critically ill patients:  140 - 180 mg/dL   Lab Results  Component Value Date   GLUCAP 224 (H) 12/30/2022   HGBA1C 12.2 (H) 12/30/2022    Review of Glycemic Control   Inpatient Diabetes Program Recommendations:    Discharge Recommendations: Other recommendations: Pt has large supply of Levemir insulin pens, discussed Levemir being discontinued in next few months, therefore will need to change to Lantus, Basagar or Toujeo pen in the future. Short acting recommendations:  Correction coverage ONLY Insulin aspart (NOVOLOG) FlexPen  Resistant Scale.   Supply/Referral recommendations: Pen needles - standard  Spoke with pt at bedside. States she will f/u with PCP in the next week or two. Discussed HgbA1C of 12.2%. Pt states she had lowered dose of Levemir to 20 units QD and will now increase back to 30 units QD that PCP had ordered.  Pt said she's started walking the track near her house and is trying to make healthier choices with her diet. Monitors blood sugars several times/day and may consider getting CGM (Libre or Dexcom). Will discuss with PCP. Discussed A1C results (12.2% on ) and explained that current A1C indicates an average glucose of 303 mg/dl over the past 2-3 months. Discussed glucose and A1C goals. Discussed importance of checking CBGs and maintaining good CBG control to prevent long-term and short-term complications. Explained how hyperglycemia leads to damage within blood vessels which lead to the common complications seen with uncontrolled diabetes. Stressed to the patient the importance of improving glycemic control to prevent further complications from uncontrolled diabetes. Discussed impact of nutrition, exercise, stress, sickness, and medications  on diabetes control. Pt voices understanding of diabetes principles.   Thank you. Ailene Ards, RD, LDN, CDCES Inpatient Diabetes Coordinator [phone]

## 2022-12-30 NOTE — Discharge Summary (Signed)
Physician Discharge Summary  [name] ZOX:[id number] DOB: 02/15/1986 DOA: 12/29/2022  PCP: Dulce Sellar, NP  Admit date: 12/29/2022 Discharge date: 12/30/2022  Admitted From: Home Disposition:  Home  Recommendations for Outpatient Follow-up:  Follow up with PCP in 1-2 weeks Please obtain BMP/CBC in one week your next doctors visit.  Insulin and supplies were prescribed to ensure she had adequate supplies. Will need outpatient referral to endocrinology by PCP. Antiemetics   Discharge Condition: Stable CODE STATUS: Full code Diet recommendation: Diabetic  Brief/Interim Summary:  37 year old with history of diabetes mellitus type 1, cyclical vomiting on chronic narcotics comes to the hospital complains of nausea vomiting.  She was found to be in DKA.  She was started on insulin drip and DKA protocol, overnight her DKA resolved.  The following morning she was transitioned to subcu insulin which she tolerated well.  She was also seen by diabetic coordinator, enough supplies were given to her.  Adjustments as mentioned above.  Medically stable for discharge today.  Insulin-dependent diabetes mellitus type 1 Diabetic ketoacidosis, resolved - Transition back to her home subcu regimen.  Tolerating well.  Diabetic coordinator has seen the patient.  Diabetic diet.  Would recommend she get endocrinology referral from PCP.  Medically stable   Discharge Diagnoses:  Principal Problem:   DKA (diabetic ketoacidosis) (HCC) Active Problems:   Diabetic polyneuropathy associated with type 1 diabetes mellitus (HCC)   Non compliance with medical treatment   Gastroesophageal reflux disease with esophagitis without hemorrhage   Intractable nausea and vomiting   Uncontrolled type 1 diabetes mellitus with hyperglycemia, with long-term current use of insulin (HCC)      Consultations: Diabetic coordinator  Subjective: Doing well no complaints.   Discharge Exam: Vitals:    12/30/22 0349 12/30/22 0800  BP:  120/89  Pulse:  (!) 101  Resp:  14  Temp: 98.3 F (36.8 C)   SpO2:  99%   Vitals:   12/29/22 2300 12/30/22 0000 12/30/22 0349 12/30/22 0800  BP: 106/63 102/64  120/89  Pulse: 96 97  (!) 101  Resp: 10 12  14   Temp:  98 F (36.7 C) 98.3 F (36.8 C)   TempSrc:  Oral Oral   SpO2: 96% 96%  99%  Weight:      Height:        General: Pt is alert, awake, not in acute distress Cardiovascular: RRR, S1/S2 +, no rubs, no gallops Respiratory: CTA bilaterally, no wheezing, no rhonchi Abdominal: Soft, NT, ND, bowel sounds + Extremities: no edema, no cyanosis  Discharge Instructions   Allergies as of 12/30/2022       Reactions   Pramipexole Shortness Of Breath        Medication List     STOP taking these medications    ondansetron 4 MG tablet Commonly known as: ZOFRAN       TAKE these medications    amitriptyline 25 MG tablet Commonly known as: ELAVIL Take 25 mg by mouth at bedtime.   B-D UF III MINI PEN NEEDLES 31G X 5 MM Misc Generic drug: Insulin Pen Needle To use with Insulin pens, as directed   cyanocobalamin 1000 MCG/ML injection Commonly known as: VITAMIN B12 Inject 1,000 mcg into the muscle every 30 (thirty) days.   Dexcom G6 Transmitter Misc Inject 1 each into the skin every 14 (fourteen) days.   DULoxetine 60 MG capsule Commonly known as: CYMBALTA Take 60 mg by mouth daily.   gabapentin 400 MG capsule Commonly known  as: NEURONTIN Take 1 capsule (400 mg total) by mouth 3 (three) times daily.   hydrOXYzine 25 MG tablet Commonly known as: ATARAX Take 1 tablet (25 mg total) by mouth at bedtime as needed (insomnia).   Insulin Syringes (Disposable) U-100 1 ML Misc Inject 1 each into the skin in the morning and at bedtime.   Levemir FlexTouch 100 UNIT/ML FlexPen Generic drug: insulin detemir 30 units daily-- will need to adjust as you intake at home varies What changed:  how much to take how to take this when  to take this additional instructions   metoCLOPramide 10 MG tablet Commonly known as: REGLAN Take 1 tablet (10 mg total) by mouth every 6 (six) hours as needed for nausea or vomiting.   metroNIDAZOLE 500 MG tablet Commonly known as: FLAGYL Take 500 mg by mouth 2 (two) times daily.   NovoLOG FlexPen 100 UNIT/ML FlexPen Generic drug: insulin aspart Inject 15-20 Units into the skin 3 (three) times daily with meals. plus sliding scale What changed: additional instructions   Oxycodone HCl 10 MG Tabs Take 10 mg by mouth daily as needed (For pain).   pantoprazole 40 MG tablet Commonly known as: PROTONIX Take 1 tablet (40 mg total) by mouth daily.   promethazine 25 MG tablet Commonly known as: PHENERGAN Take 1 tablet (25 mg total) by mouth every 8 (eight) hours as needed for nausea or vomiting. What changed: when to take this   rosuvastatin 10 MG tablet Commonly known as: CRESTOR Take 10 mg by mouth daily.   topiramate 50 MG tablet Commonly known as: TOPAMAX Take 50 mg by mouth daily.   traZODone 150 MG tablet Commonly known as: DESYREL Take 150 mg by mouth at bedtime.        Follow-up Information     Dulce Sellar, NP Follow up in 1 week(s).   Specialty: Family Medicine Contact information: [address] [place] [address] [phone]                Allergies  Allergen Reactions   Pramipexole Shortness Of Breath    You were cared for by a hospitalist during your hospital stay. If you have any questions about your discharge medications or the care you received while you were in the hospital after you are discharged, you can call the unit and asked to speak with the hospitalist on call if the hospitalist that took care of you is not available. Once you are discharged, your primary care physician will handle any further medical issues. Please note that no refills for any discharge medications will be authorized once you are discharged, as it is  imperative that you return to your primary care physician (or establish a relationship with a primary care physician if you do not have one) for your aftercare needs so that they can reassess your need for medications and monitor your lab values.  You were cared for by a hospitalist during your hospital stay. If you have any questions about your discharge medications or the care you received while you were in the hospital after you are discharged, you can call the unit and asked to speak with the hospitalist on call if the hospitalist that took care of you is not available. Once you are discharged, your primary care physician will handle any further medical issues. Please note that NO REFILLS for any discharge medications will be authorized once you are discharged, as it is imperative that you return to your primary care physician (or  establish a relationship with a primary care physician if you do not have one) for your aftercare needs so that they can reassess your need for medications and monitor your lab values.  Please request your Prim.MD to go over all Hospital Tests and Procedure/Radiological results at the follow up, please get all Hospital records sent to your Prim MD by signing hospital release before you go home.  Get CBC, CMP, 2 view Chest X ray checked  by Primary MD during your next visit or SNF MD in 5-7 days ( we routinely change or add medications that can affect your baseline labs and fluid status, therefore we recommend that you get the mentioned basic workup next visit with your PCP, your PCP may decide not to get them or add new tests based on their clinical decision)  On your next visit with your primary care physician please Get Medicines reviewed and adjusted.  If you experience worsening of your admission symptoms, develop shortness of breath, life threatening emergency, suicidal or homicidal thoughts you must seek medical attention immediately by calling 911 or calling your MD  immediately  if symptoms less severe.  You Must read complete instructions/literature along with all the possible adverse reactions/side effects for all the Medicines you take and that have been prescribed to you. Take any new Medicines after you have completely understood and accpet all the possible adverse reactions/side effects.   Do not drive, operate heavy machinery, perform activities at heights, swimming or participation in water activities or provide baby sitting services if your were admitted for syncope or siezures until you have seen by Primary MD or a Neurologist and advised to do so again.  Do not drive when taking Pain medications.   Procedures/Studies: No results found.   The results of significant diagnostics from this hospitalization (including imaging, microbiology, ancillary and laboratory) are listed below for reference.     Microbiology: Recent Results (from the past 240 hour(s))  MRSA Next Gen by PCR, Nasal     Status: None   Collection Time: 12/29/22  8:48 AM   Specimen: Nasal Mucosa; Nasal Swab  Result Value Ref Range Status   MRSA by PCR Next Gen NOT DETECTED NOT DETECTED Final    Comment: (NOTE) The GeneXpert MRSA Assay (FDA approved for NASAL specimens only), is one component of a comprehensive MRSA colonization surveillance program. It is not intended to diagnose MRSA infection nor to guide or monitor treatment for MRSA infections. Test performance is not FDA approved in patients less than 57 years old. Performed at [facility], [address], [place], [place] [address]      Labs: BNP (last 3 results) No results for input(s): "BNP" in the last 8760 hours. Basic Metabolic Panel: Recent Labs  Lab 12/29/22 1332 12/29/22 1629 12/29/22 2047 12/30/22 0051 12/30/22 0449  NA 137 138 134* 135 132*  K 3.5 3.8 3.8 3.4* 3.3*  CL 100 100 98 99 98  CO2 30 29 27 29 27   GLUCOSE 189* 223* 271* 202* 207*  BUN 18 18 14 13 11   CREATININE  0.77 0.82 0.87 0.73 0.74  CALCIUM 8.3* 8.5* 8.4* 8.3* 8.0*  MG  --   --   --   --  1.8  PHOS  --   --   --   --  2.3*   Liver Function Tests: No results for input(s): "AST", "ALT", "ALKPHOS", "BILITOT", "PROT", "ALBUMIN" in the last 168 hours. No results for input(s): "LIPASE", "AMYLASE" in the  last 168 hours. No results for input(s): "AMMONIA" in the last 168 hours. CBC: Recent Labs  Lab 12/29/22 0142 12/29/22 0153 12/30/22 0449  WBC 10.8*  --  8.6  HGB 13.7 14.6 10.5*  HCT 38.9 43.0 31.9*  MCV 86.4  --  92.5  PLT 343  --  277   Cardiac Enzymes: No results for input(s): "CKTOTAL", "CKMB", "CKMBINDEX", "TROPONINI" in the last 168 hours. BNP: Invalid input(s): "POCBNP" CBG: Recent Labs  Lab 12/29/22 1638 12/29/22 1731 12/29/22 1830 12/29/22 2046 12/30/22 0836  GLUCAP 226* 215* 170* 238* 224*   D-Dimer No results for input(s): "DDIMER" in the last 72 hours. Hgb A1c Recent Labs    12/30/22 0449  HGBA1C 12.2*   Lipid Profile No results for input(s): "CHOL", "HDL", "LDLCALC", "TRIG", "CHOLHDL", "LDLDIRECT" in the last 72 hours. Thyroid function studies No results for input(s): "TSH", "T4TOTAL", "T3FREE", "THYROIDAB" in the last 72 hours.  Invalid input(s): "FREET3" Anemia work up No results for input(s): "VITAMINB12", "FOLATE", "FERRITIN", "TIBC", "IRON", "RETICCTPCT" in the last 72 hours. Urinalysis    Component Value Date/Time   COLORURINE YELLOW 12/29/2022 1617   APPEARANCEUR CLEAR 12/29/2022 1617   LABSPEC 1.025 12/29/2022 1617   PHURINE 6.0 12/29/2022 1617   GLUCOSEU >=500 (A) 12/29/2022 1617   HGBUR NEGATIVE 12/29/2022 1617   BILIRUBINUR NEGATIVE 12/29/2022 1617   BILIRUBINUR negative 11/08/2021 1424   KETONESUR 20 (A) 12/29/2022 1617   PROTEINUR 30 (A) 12/29/2022 1617   UROBILINOGEN 0.2 11/08/2021 1424   UROBILINOGEN 0.2 05/03/2021 0837   NITRITE NEGATIVE 12/29/2022 1617   LEUKOCYTESUR NEGATIVE 12/29/2022 1617   Sepsis Labs Recent Labs  Lab  12/29/22 0142 12/30/22 0449  WBC 10.8* 8.6   Microbiology Recent Results (from the past 240 hour(s))  MRSA Next Gen by PCR, Nasal     Status: None   Collection Time: 12/29/22  8:48 AM   Specimen: Nasal Mucosa; Nasal Swab  Result Value Ref Range Status   MRSA by PCR Next Gen NOT DETECTED NOT DETECTED Final    Comment: (NOTE) The GeneXpert MRSA Assay (FDA approved for NASAL specimens only), is one component of a comprehensive MRSA colonization surveillance program. It is not intended to diagnose MRSA infection nor to guide or monitor treatment for MRSA infections. Test performance is not FDA approved in patients less than 64 years old. Performed at [facility], [address] [place], [place] [address]      Time coordinating discharge:  I have spent 35 minutes face to face with the patient and on the ward discussing the patients care, assessment, plan and disposition with other care givers. >50% of the time was devoted counseling the patient about the risks and benefits of treatment/Discharge disposition and coordinating care.   SIGNED:   Dimple Nanas, MD  [facility] 12/30/2022, 12:29 PM   If 7PM-7AM, please contact night-coverage

## 2022-12-31 ENCOUNTER — Other Ambulatory Visit: Payer: Self-pay

## 2022-12-31 ENCOUNTER — Encounter (HOSPITAL_BASED_OUTPATIENT_CLINIC_OR_DEPARTMENT_OTHER): Payer: Self-pay

## 2022-12-31 ENCOUNTER — Emergency Department (HOSPITAL_BASED_OUTPATIENT_CLINIC_OR_DEPARTMENT_OTHER)
Admission: EM | Admit: 2022-12-31 | Discharge: 2022-12-31 | Disposition: A | Discharge status: Home / Self Care | Payer: BC Managed Care – PPO | Attending: Emergency Medicine | Admitting: Emergency Medicine

## 2022-12-31 DIAGNOSIS — Z1152 Encounter for screening for COVID-19: Secondary | ICD-10-CM | POA: Diagnosis not present

## 2022-12-31 DIAGNOSIS — E1043 Type 1 diabetes mellitus with diabetic autonomic (poly)neuropathy: Secondary | ICD-10-CM | POA: Diagnosis not present

## 2022-12-31 DIAGNOSIS — K3184 Gastroparesis: Secondary | ICD-10-CM | POA: Insufficient documentation

## 2022-12-31 DIAGNOSIS — E1143 Type 2 diabetes mellitus with diabetic autonomic (poly)neuropathy: Secondary | ICD-10-CM

## 2022-12-31 DIAGNOSIS — R112 Nausea with vomiting, unspecified: Secondary | ICD-10-CM | POA: Diagnosis present

## 2022-12-31 LAB — HEPATIC FUNCTION PANEL
ALT: 17 U/L (ref 0–44)
AST: 18 U/L (ref 15–41)
Albumin: 4.7 g/dL (ref 3.5–5.0)
Alkaline Phosphatase: 91 U/L (ref 38–126)
Bilirubin, Direct: <0.1 mg/dL (ref 0.0–0.2)
Total Bilirubin: 0.4 mg/dL (ref 0.3–1.2)
Total Protein: 8.3 g/dL — ABNORMAL HIGH (ref 6.5–8.1)

## 2022-12-31 LAB — URINALYSIS, ROUTINE W REFLEX MICROSCOPIC
Bacteria, UA: NONE SEEN
Bilirubin Urine: NEGATIVE
Glucose, UA: 500 mg/dL — AB
Hgb urine dipstick: NEGATIVE
Ketones, ur: NEGATIVE mg/dL
Leukocytes,Ua: NEGATIVE
Nitrite: NEGATIVE
Specific Gravity, Urine: 1.011 (ref 1.005–1.030)
pH: 8 (ref 5.0–8.0)

## 2022-12-31 LAB — BETA-HYDROXYBUTYRIC ACID: Beta-Hydroxybutyric Acid: 0.17 mmol/L (ref 0.05–0.27)

## 2022-12-31 LAB — CBC
HCT: 40.3 % (ref 36.0–46.0)
Hemoglobin: 13.6 g/dL (ref 12.0–15.0)
MCH: 29.8 pg (ref 26.0–34.0)
MCHC: 33.7 g/dL (ref 30.0–36.0)
MCV: 88.2 fL (ref 80.0–100.0)
Platelets: 320 10*3/uL (ref 150–400)
RBC: 4.57 MIL/uL (ref 3.87–5.11)
RDW: 13 % (ref 11.5–15.5)
WBC: 7.1 10*3/uL (ref 4.0–10.5)
nRBC: 0 % (ref 0.0–0.2)

## 2022-12-31 LAB — BASIC METABOLIC PANEL
Anion gap: 12 (ref 5–15)
BUN: 11 mg/dL (ref 6–20)
CO2: 26 mmol/L (ref 22–32)
Calcium: 10.2 mg/dL (ref 8.9–10.3)
Chloride: 96 mmol/L — ABNORMAL LOW (ref 98–111)
Creatinine, Ser: 0.79 mg/dL (ref 0.44–1.00)
GFR, Estimated: >60 mL/min (ref >60)
Glucose, Bld: 249 mg/dL — ABNORMAL HIGH (ref 70–99)
Potassium: 4 mmol/L (ref 3.5–5.1)
Sodium: 134 mmol/L — ABNORMAL LOW (ref 135–145)

## 2022-12-31 LAB — CBG MONITORING, ED: Glucose-Capillary: 233 mg/dL — ABNORMAL HIGH (ref 70–99)

## 2022-12-31 LAB — SARS CORONAVIRUS 2 BY RT PCR: SARS Coronavirus 2 by RT PCR: NEGATIVE

## 2022-12-31 LAB — LIPASE, BLOOD: Lipase: 10 U/L — ABNORMAL LOW (ref 11–51)

## 2022-12-31 LAB — HCG, SERUM, QUALITATIVE: Preg, Serum: NEGATIVE

## 2022-12-31 MED ORDER — METOCLOPRAMIDE HCL 5 MG/ML IJ SOLN
10.0000 mg | Freq: Once | INTRAMUSCULAR | Status: AC
  Administered 2022-12-31: 10 mg via INTRAVENOUS
  Filled 2022-12-31: qty 2

## 2022-12-31 MED ORDER — SODIUM CHLORIDE 0.9 % IV SOLN
25.0000 mg | Freq: Once | INTRAVENOUS | Status: AC
  Administered 2022-12-31: 25 mg via INTRAVENOUS
  Filled 2022-12-31: qty 1

## 2022-12-31 MED ORDER — METOCLOPRAMIDE HCL 10 MG PO TABS: 10.0000 mg | ORAL_TABLET | Freq: Four times a day (QID) | ORAL | 0 refills | Status: DC | PRN

## 2022-12-31 MED ORDER — ONDANSETRON 4 MG PO TBDP
4.0000 mg | ORAL_TABLET | Freq: Once | ORAL | Status: AC
  Administered 2022-12-31: 4 mg via ORAL
  Filled 2022-12-31: qty 1

## 2022-12-31 MED ORDER — DROPERIDOL 2.5 MG/ML IJ SOLN: 2.5000 mg | Freq: Once | INTRAMUSCULAR | Status: DC

## 2022-12-31 MED ORDER — SODIUM CHLORIDE 0.9 % IV SOLN
12.5000 mg | Freq: Once | INTRAVENOUS | Status: DC
  Filled 2022-12-31: qty 0.5

## 2022-12-31 MED ORDER — SODIUM CHLORIDE 0.9 % IV BOLUS
1000.0000 mL | Freq: Once | INTRAVENOUS | Status: AC
  Administered 2022-12-31: 1000 mL via INTRAVENOUS

## 2022-12-31 MED ORDER — PROMETHAZINE HCL 25 MG/ML IJ SOLN
INTRAMUSCULAR | Status: AC
  Filled 2022-12-31: qty 1

## 2022-12-31 MED ORDER — MORPHINE SULFATE (PF) 4 MG/ML IV SOLN
4.0000 mg | Freq: Once | INTRAVENOUS | Status: AC
  Administered 2022-12-31: 4 mg via INTRAVENOUS
  Filled 2022-12-31: qty 1

## 2022-12-31 NOTE — ED Notes (Signed)
Pt unable to tolerate water and crackers, began vomiting again. Schuman PA made aware

## 2022-12-31 NOTE — Discharge Instructions (Addendum)
Please follow-up with your primary care provider recent symptoms and ER visit.  Please pick up the Reglan I prescribed for you and over the next few days please avoid heavy meals.  Please continue to use your sliding scale and Levemir as prescribed.  Is very important that you stay hydrated as well.  If symptoms worsen please return to ER.

## 2022-12-31 NOTE — ED Notes (Signed)
Pt is unable to give a urine sample at this time. Specimen cup is at the bedside.

## 2022-12-31 NOTE — ED Notes (Signed)
Pt given crackers and water for PO challenge, encouraged to eat and drink

## 2022-12-31 NOTE — ED Triage Notes (Signed)
Pt c/o "bodyaches- body is on fire- vomiting x this morning." Seen/ admitted for DKA Sunday, dc yesterday. Reports she feels the same as Sunday.   Checked sugar this AM, reports it was 45, has eaten since.

## 2022-12-31 NOTE — ED Notes (Signed)
Pt able to tolerate fluids at this time. . 

## 2022-12-31 NOTE — ED Notes (Signed)
Pt verbalized understanding of d/c instructions, meds, and followup care. Denies questions. VSS, no distress noted. Steady gait to exit with all belongings.  ?

## 2022-12-31 NOTE — ED Provider Notes (Signed)
[place] EMERGENCY DEPARTMENT AT [facility] Provider Note   CSN: [id number] Arrival date & time: 12/31/22  1049     History  Chief Complaint  Patient presents with   Emesis    [name] is a 37 y.o. female history of type 1 diabetes with DKA presented with nausea vomiting that began this morning.  Patient states that she was recently discharged for the same chief concern yesterday as she was admitted for DKA and feels this is similar to previous episodes.  Patient states that she is on sliding scale along with 20 units of Levemir that she took yesterday and this morning.  Patient states this is no change in her usual diabetes regiment.  Patient noted that her sugars were reportedly 44 at home and patient had eggs which she said helped her symptoms but states otherwise she has not been able to keep any food or fluid down.  Patient states that if she feels on fire but denies any fevers.  Patient denied shortness of breath, chest pain, diarrhea, dysuria, change in sensation/motor skills, vision changes, headache  Home Medications Prior to Admission medications   Medication Sig Start Date End Date Taking? Authorizing Provider  amitriptyline (ELAVIL) 25 MG tablet Take 25 mg by mouth at bedtime. 06/26/22   [provider]  Continuous Blood Gluc Transmit (DEXCOM G6 TRANSMITTER) MISC Inject 1 each into the skin every 14 (fourteen) days. 12/31/21   Dulce Sellar, NP  cyanocobalamin (VITAMIN B12) 1000 MCG/ML injection Inject 1,000 mcg into the muscle every 30 (thirty) days. 08/31/22   [provider]  DULoxetine (CYMBALTA) 60 MG capsule Take 60 mg by mouth daily. 06/28/22   [provider]  gabapentin (NEURONTIN) 400 MG capsule Take 1 capsule (400 mg total) by mouth 3 (three) times daily. Patient not taking: Reported on 05/07/2022 03/10/22   Erick Blinks, MD  hydrOXYzine (ATARAX) 25 MG tablet Take 1 tablet (25 mg total) by mouth at bedtime as  needed (insomnia). Patient not taking: Reported on 12/29/2022 06/03/22   Pollyann Savoy, MD  insulin aspart (NOVOLOG) 100 UNIT/ML FlexPen Inject 0-18 Units into the skin 3 (three) times daily with meals. Check Blood Glucose (BG) and inject per scale: BG <150= 0 unit; BG 150-200= 3 unit; BG 201-250= 6 unit; BG 251-300= 9 unit; BG 301-350= 12 unit; BG 351-400= 15 unit; BG >400= 18 unit and Call Primary Care. 12/30/22 03/30/23  Amin, Loura Halt, MD  insulin detemir (LEVEMIR FLEXTOUCH) 100 UNIT/ML FlexPen 30 units daily-- will need to adjust as you intake at home varies Patient taking differently: Inject 20 Units into the skin daily. 05/10/22   Joseph Art, DO  Insulin Pen Needle (B-D UF III MINI PEN NEEDLES) 31G X 5 MM MISC To use with Insulin pens, as directed 11/20/21   Dulce Sellar, NP  Insulin Pen Needle (PEN NEEDLES) 31G X 5 MM MISC 1 each by Does not apply route 3 (three) times daily. May dispense any manufacturer covered by patient's insurance. 12/30/22   Amin, Loura Halt, MD  Insulin Syringes, Disposable, U-100 1 ML MISC Inject 1 each into the skin in the morning and at bedtime. 08/10/21   Dulce Sellar, NP  metoCLOPramide (REGLAN) 10 MG tablet Take 1 tablet (10 mg total) by mouth every 6 (six) hours as needed for nausea or vomiting. 12/31/22   Netta Corrigan, PA-C  metroNIDAZOLE (FLAGYL) 500 MG tablet Take 500 mg by mouth 2 (two) times daily.  [provider]  Oxycodone HCl 10 MG TABS Take 10 mg by mouth daily as needed (For pain). 08/07/22   [provider]  pantoprazole (PROTONIX) 40 MG tablet Take 1 tablet (40 mg total) by mouth daily. 05/10/22   Joseph Art, DO  promethazine (PHENERGAN) 25 MG tablet Take 1 tablet (25 mg total) by mouth every 8 (eight) hours as needed for nausea or vomiting. 12/30/22   Amin, Loura Halt, MD  rosuvastatin (CRESTOR) 10 MG tablet Take 10 mg by mouth daily. 07/04/22   [provider]  topiramate (TOPAMAX) 50 MG  tablet Take 50 mg by mouth daily. 04/29/22   [provider]  traZODone (DESYREL) 150 MG tablet Take 150 mg by mouth at bedtime.    [provider]      Allergies    Pramipexole    Review of Systems   Review of Systems  Gastrointestinal:  Positive for vomiting.    Physical Exam Updated Vital Signs BP (!) 158/89   Pulse (!) 105   Temp 98.3 F (36.8 C) (Oral)   Resp 14   Ht 5\' 6"  (1.676 m)   Wt 85 kg   LMP 12/23/2022   SpO2 97%   BMI 30.25 kg/m  Physical Exam Vitals reviewed.  Constitutional:      General: She is not in acute distress.    Comments: Appears uncomfortable  HENT:     Head: Normocephalic and atraumatic.  Eyes:     Extraocular Movements: Extraocular movements intact.     Conjunctiva/sclera: Conjunctivae normal.     Pupils: Pupils are equal, round, and reactive to light.  Cardiovascular:     Rate and Rhythm: Normal rate and regular rhythm.     Pulses: Normal pulses.     Heart sounds: Normal heart sounds.     Comments: 2+ bilateral radial/dorsalis pedis pulses with regular rate Pulmonary:     Effort: Pulmonary effort is normal. No respiratory distress.     Breath sounds: Normal breath sounds.  Abdominal:     Palpations: Abdomen is soft.     Tenderness: There is no abdominal tenderness. There is no guarding or rebound.  Musculoskeletal:        General: Normal range of motion.     Cervical back: Normal range of motion and neck supple.     Comments: 5 out of 5 bilateral grip/leg extension strength  Skin:    General: Skin is warm and dry.     Capillary Refill: Capillary refill takes less than 2 seconds.  Neurological:     General: No focal deficit present.     Mental Status: She is alert and oriented to person, place, and time.     Comments: Sensation intact in all 4 limbs  Psychiatric:        Mood and Affect: Mood normal.     ED Results / Procedures / Treatments   Labs (all labs ordered are listed, but only abnormal results are  displayed) Labs Reviewed  BASIC METABOLIC PANEL - Abnormal; Notable for the following components:      Result Value   Sodium 134 (*)    Chloride 96 (*)    Glucose, Bld 249 (*)    All other components within normal limits  LIPASE, BLOOD - Abnormal; Notable for the following components:   Lipase 10 (*)    All other components within normal limits  URINALYSIS, ROUTINE W REFLEX MICROSCOPIC - Abnormal; Notable for the following components:   Color, Urine  COLORLESS (*)    Glucose, UA 500 (*)    Protein, ur TRACE (*)    All other components within normal limits  HEPATIC FUNCTION PANEL - Abnormal; Notable for the following components:   Total Protein 8.3 (*)    All other components within normal limits  CBG MONITORING, ED - Abnormal; Notable for the following components:   Glucose-Capillary 233 (*)    All other components within normal limits  SARS CORONAVIRUS 2 BY RT PCR  CBC  HCG, SERUM, QUALITATIVE  BETA-HYDROXYBUTYRIC ACID    EKG EKG Interpretation  Date/Time:  Tuesday Dec 31 2022 11:14:42 EDT Ventricular Rate:  111 PR Interval:  136 QRS Duration: 72 QT Interval:  332 QTC Calculation: 451 R Axis:   88 Text Interpretation: Sinus tachycardia Otherwise normal ECG When compared with ECG of 27-Sep-2022 08:04, PREVIOUS ECG IS PRESENT No acute changes No significant change since last tracing Confirmed by Derwood Kaplan [phone]) on 12/31/2022 12:09:40 PM  Radiology No results found.  Procedures Procedures    Medications Ordered in ED Medications  promethazine (PHENERGAN) 25 MG/ML injection (  Not Given 12/31/22 1237)  promethazine (PHENERGAN) 25 MG/ML injection (  Not Given 12/31/22 1449)  ondansetron (ZOFRAN-ODT) disintegrating tablet 4 mg (4 mg Oral Given 12/31/22 1105)  sodium chloride 0.9 % bolus 1,000 mL (0 mLs Intravenous Stopped 12/31/22 1416)  morphine (PF) 4 MG/ML injection 4 mg (4 mg Intravenous Given 12/31/22 1236)  promethazine (PHENERGAN) 25 mg in sodium chloride 0.9  % 50 mL IVPB (0 mg Intravenous Stopped 12/31/22 1314)  promethazine (PHENERGAN) 25 mg in sodium chloride 0.9 % 50 mL IVPB (0 mg Intravenous Stopped 12/31/22 1517)  metoCLOPramide (REGLAN) injection 10 mg (10 mg Intravenous Given 12/31/22 1538)    ED Course/ Medical Decision Making/ A&P                             Medical Decision Making Amount and/or Complexity of Data Reviewed Labs: ordered.  Risk Prescription drug management.   Sofi L McAdoo Collier 37 y.o. presented today for nausea/vomiting. Working DDx that I considered at this time includes, but not limited to, diabetic gastroparesis, DKA, gastroenteritis, colitis, small bowel obstruction, appendicitis, cholecystitis, pancreatitis, nephrolithiasis, AAA, UTI, pyleonephritis, ruptured ectopic pregnancy, PID, ovarian torsion.  R/o DDx: DKA, gastroenteritis, colitis, small bowel obstruction, appendicitis, cholecystitis, pancreatitis, nephrolithiasis, AAA, UTI, pyleonephritis, ruptured ectopic pregnancy, PID, ovarian torsion: These are considered less likely due to history of present illness and physical exam findings.  Review of prior external notes: 12/29/2022 ED provider notes  Unique Tests and My Interpretation:  CBC with differential: Unremarkable BMP: Glucose 249 which is up from yesterday from 207 CBG: 233 Lipase: Unremarkable Beta hydroxybutyrate: Unremarkable Hepatic function panel: Unremarkable COVID: Negative UA: Unremarkable similar to previous readings Serum pregnancy: Negative EKG: Rate, rhythm, axis, intervals all examined and without medically relevant abnormality. ST segments without concerns for elevations  Discussion with Independent Historian: None  Discussion of Management of Tests: None  Risk: Medium: prescription drug management  Risk Stratification Score: none  Staffed with Rhunette Croft, MD   Plan: Patient presented for nausea vomiting. On exam patient was in no acute distress but did have vitals of  heart rate 112 and blood pressure 160/104 and did look uncomfortable.  Patient's physical exam was largely unremarkable and patient stated that this feels similar to previous DKA episodes.  Patient states she has been able to tolerate eggs this morning as she knows  her sugar was 44.  Upon arrival patient sugars with a CBG was 233.  Patient states that her diabetes regimen has not changed and is unsure why she keeps having this nausea and vomiting.  Patient stated the Zofran she can triage is not helping and so patient be given morphine along with Phenergan as this does seem to work in the past patient's symptoms.  Labs were drawn and patient will be monitored.  Patient stable this time.  After receiving Phenergan and morphine patient states she felt better.  On recheck patient was resting comfortably.  I updated patient on the labs and stated she does not have an anion gap along with a glucose level that is similar to baseline for her and so I have a low suspicion for DKA at this point but think her symptoms may be related to gastroparesis.  Patient states this is also similar to her previous gastroparesis episodes in which Phenergan has been the only medication that works.  Pending patient labs patient may be p.o. challenged and if successful discharge with Phenergan with outpatient follow-up.  Patient stable this time.  Patient's labs have been reassuring.  Patient refused p.o. challenge until she received more Phenergan and so another round of Phenergan was ordered and patient will be p.o. challenged.  Patient had water and crackers and to minutes later began having nonbloody emesis.  Patient has failed 2 rounds of Phenergan and so Reglan to be ordered.  After seeing Reglan patient was able to tolerate fluids orally and stated that she felt comfortable to be discharged.  Patient will be given prescription of Reglan and strongly encouraged follow-up with primary care provider and to continue her diabetic  regiment. I encouraged patient to take in plenty of fluids and avoid dehydration and to monitor her sugars.  Patient was given return precautions.patient stable for discharge at this time.  Patient verbalized understanding of plan.         Final Clinical Impression(s) / ED Diagnoses Final diagnoses:  Diabetic gastroparesis (HCC)    Rx / DC Orders ED Discharge Orders          Ordered    metoCLOPramide (REGLAN) 10 MG tablet  Every 6 hours PRN        12/31/22 1637              Remi Deter 12/31/22 1653    Derwood Kaplan, MD 01/01/23 1110

## 2022-12-31 NOTE — ED Notes (Signed)
Attempted to PO Challenge Patient with Water. Refused stating she was still nauseous. PA informed

## 2023-02-01 ENCOUNTER — Encounter (HOSPITAL_BASED_OUTPATIENT_CLINIC_OR_DEPARTMENT_OTHER): Payer: Self-pay

## 2023-02-01 ENCOUNTER — Emergency Department (HOSPITAL_BASED_OUTPATIENT_CLINIC_OR_DEPARTMENT_OTHER)
Admission: EM | Admit: 2023-02-01 | Discharge: 2023-02-01 | Discharge status: Against Medical Advice | Payer: BC Managed Care – PPO | Source: Home / Self Care | Attending: Emergency Medicine | Admitting: Emergency Medicine

## 2023-02-01 ENCOUNTER — Other Ambulatory Visit: Payer: Self-pay

## 2023-02-01 DIAGNOSIS — K219 Gastro-esophageal reflux disease without esophagitis: Secondary | ICD-10-CM | POA: Insufficient documentation

## 2023-02-01 DIAGNOSIS — R000 Tachycardia, unspecified: Secondary | ICD-10-CM | POA: Insufficient documentation

## 2023-02-01 DIAGNOSIS — L0390 Cellulitis, unspecified: Secondary | ICD-10-CM | POA: Diagnosis not present

## 2023-02-01 DIAGNOSIS — Z794 Long term (current) use of insulin: Secondary | ICD-10-CM | POA: Insufficient documentation

## 2023-02-01 DIAGNOSIS — E1010 Type 1 diabetes mellitus with ketoacidosis without coma: Secondary | ICD-10-CM | POA: Diagnosis not present

## 2023-02-01 DIAGNOSIS — E119 Type 2 diabetes mellitus without complications: Secondary | ICD-10-CM | POA: Insufficient documentation

## 2023-02-01 DIAGNOSIS — R1110 Vomiting, unspecified: Secondary | ICD-10-CM | POA: Insufficient documentation

## 2023-02-01 DIAGNOSIS — R1013 Epigastric pain: Secondary | ICD-10-CM | POA: Insufficient documentation

## 2023-02-01 LAB — COMPREHENSIVE METABOLIC PANEL
ALT: 28 U/L (ref 0–44)
AST: 15 U/L (ref 15–41)
Albumin: 4.7 g/dL (ref 3.5–5.0)
Alkaline Phosphatase: 106 U/L (ref 38–126)
Anion gap: 15 (ref 5–15)
BUN: 13 mg/dL (ref 6–20)
CO2: 24 mmol/L (ref 22–32)
Calcium: 10.4 mg/dL — ABNORMAL HIGH (ref 8.9–10.3)
Chloride: 95 mmol/L — ABNORMAL LOW (ref 98–111)
Creatinine, Ser: 0.86 mg/dL (ref 0.44–1.00)
GFR, Estimated: >60 mL/min (ref >60)
Glucose, Bld: 356 mg/dL — ABNORMAL HIGH (ref 70–99)
Potassium: 3.7 mmol/L (ref 3.5–5.1)
Sodium: 134 mmol/L — ABNORMAL LOW (ref 135–145)
Total Bilirubin: 0.6 mg/dL (ref 0.3–1.2)
Total Protein: 8.3 g/dL — ABNORMAL HIGH (ref 6.5–8.1)

## 2023-02-01 LAB — CBC
HCT: 38.4 % (ref 36.0–46.0)
Hemoglobin: 13.4 g/dL (ref 12.0–15.0)
MCH: 30.5 pg (ref 26.0–34.0)
MCHC: 34.9 g/dL (ref 30.0–36.0)
MCV: 87.3 fL (ref 80.0–100.0)
Platelets: 344 10*3/uL (ref 150–400)
RBC: 4.4 MIL/uL (ref 3.87–5.11)
RDW: 13.8 % (ref 11.5–15.5)
WBC: 10.8 10*3/uL — ABNORMAL HIGH (ref 4.0–10.5)
nRBC: 0 % (ref 0.0–0.2)

## 2023-02-01 LAB — CBG MONITORING, ED
Glucose-Capillary: 297 mg/dL — ABNORMAL HIGH (ref 70–99)
Glucose-Capillary: 336 mg/dL — ABNORMAL HIGH (ref 70–99)

## 2023-02-01 LAB — LIPASE, BLOOD: Lipase: 13 U/L (ref 11–51)

## 2023-02-01 MED ORDER — SODIUM CHLORIDE 0.9 % IV SOLN: Freq: Once | INTRAVENOUS | Status: AC

## 2023-02-01 MED ORDER — ONDANSETRON HCL 4 MG/2ML IJ SOLN
4.0000 mg | Freq: Once | INTRAMUSCULAR | Status: AC | PRN
  Administered 2023-02-01: 4 mg via INTRAVENOUS
  Filled 2023-02-01: qty 2

## 2023-02-01 MED ORDER — DICYCLOMINE HCL 10 MG/ML IM SOLN
20.0000 mg | Freq: Once | INTRAMUSCULAR | Status: AC
  Administered 2023-02-01: 20 mg via INTRAMUSCULAR
  Filled 2023-02-01: qty 2

## 2023-02-01 MED ORDER — PROCHLORPERAZINE EDISYLATE 10 MG/2ML IJ SOLN
10.0000 mg | Freq: Once | INTRAMUSCULAR | Status: AC
  Administered 2023-02-01: 10 mg via INTRAVENOUS
  Filled 2023-02-01: qty 2

## 2023-02-01 NOTE — ED Provider Notes (Signed)
[place] EMERGENCY DEPARTMENT AT [facility] Provider Note   CSN: [id number] Arrival date & time: 02/01/23  1603     History  Chief Complaint  Patient presents with   Emesis    [name] is a 37 y.o. female with a history of diabetes mellitus, GERD, and diabetic gastroparesis who presents to the ED today for vomiting and abdominal pain. Patient reports vomiting and pain since she woke up this morning. She states that she briefly passed out from vomiting so much but denies hitting her head. Patient does not know how many times she threw up today but she vomited 3 times prior to obtaining her history. She reports a similar episode several months ago and she then was diagnosed with gastroparesis. Denies fever, radiating pain, urinary symptoms or changes to bowel habits. She admits to marijuana use.     Home Medications Prior to Admission medications   Medication Sig Start Date End Date Taking? Authorizing Provider  amitriptyline (ELAVIL) 25 MG tablet Take 25 mg by mouth at bedtime. 06/26/22   [provider]  Continuous Blood Gluc Transmit (DEXCOM G6 TRANSMITTER) MISC Inject 1 each into the skin every 14 (fourteen) days. 12/31/21   Dulce Sellar, NP  cyanocobalamin (VITAMIN B12) 1000 MCG/ML injection Inject 1,000 mcg into the muscle every 30 (thirty) days. 08/31/22   [provider]  DULoxetine (CYMBALTA) 60 MG capsule Take 60 mg by mouth daily. 06/28/22   [provider]  gabapentin (NEURONTIN) 400 MG capsule Take 1 capsule (400 mg total) by mouth 3 (three) times daily. Patient not taking: Reported on 05/07/2022 03/10/22   Erick Blinks, MD  hydrOXYzine (ATARAX) 25 MG tablet Take 1 tablet (25 mg total) by mouth at bedtime as needed (insomnia). Patient not taking: Reported on 12/29/2022 06/03/22   Pollyann Savoy, MD  insulin aspart (NOVOLOG) 100 UNIT/ML FlexPen Inject 0-18 Units into the skin 3 (three) times daily with meals. Check  Blood Glucose (BG) and inject per scale: BG <150= 0 unit; BG 150-200= 3 unit; BG 201-250= 6 unit; BG 251-300= 9 unit; BG 301-350= 12 unit; BG 351-400= 15 unit; BG >400= 18 unit and Call Primary Care. 12/30/22 03/30/23  Amin, Loura Halt, MD  insulin detemir (LEVEMIR FLEXTOUCH) 100 UNIT/ML FlexPen 30 units daily-- will need to adjust as you intake at home varies Patient taking differently: Inject 20 Units into the skin daily. 05/10/22   Joseph Art, DO  Insulin Pen Needle (B-D UF III MINI PEN NEEDLES) 31G X 5 MM MISC To use with Insulin pens, as directed 11/20/21   Dulce Sellar, NP  Insulin Pen Needle (PEN NEEDLES) 31G X 5 MM MISC 1 each by Does not apply route 3 (three) times daily. May dispense any manufacturer covered by patient's insurance. 12/30/22   Amin, Loura Halt, MD  Insulin Syringes, Disposable, U-100 1 ML MISC Inject 1 each into the skin in the morning and at bedtime. 08/10/21   Dulce Sellar, NP  metoCLOPramide (REGLAN) 10 MG tablet Take 1 tablet (10 mg total) by mouth every 6 (six) hours as needed for nausea or vomiting. 12/31/22   Netta Corrigan, PA-C  metroNIDAZOLE (FLAGYL) 500 MG tablet Take 500 mg by mouth 2 (two) times daily.    [provider]  Oxycodone HCl 10 MG TABS Take 10 mg by mouth daily as needed (For pain). 08/07/22   [provider]  pantoprazole (PROTONIX) 40 MG tablet Take 1 tablet (40 mg total) by mouth daily.  05/10/22   Joseph Art, DO  promethazine (PHENERGAN) 25 MG tablet Take 1 tablet (25 mg total) by mouth every 8 (eight) hours as needed for nausea or vomiting. 12/30/22   Amin, Loura Halt, MD  rosuvastatin (CRESTOR) 10 MG tablet Take 10 mg by mouth daily. 07/04/22   [provider]  topiramate (TOPAMAX) 50 MG tablet Take 50 mg by mouth daily. 04/29/22   [provider]  traZODone (DESYREL) 150 MG tablet Take 150 mg by mouth at bedtime.    [provider]      Allergies    Pramipexole    Review of  Systems   Review of Systems  Gastrointestinal:  Positive for abdominal pain and vomiting.  All other systems reviewed and are negative.   Physical Exam Updated Vital Signs BP (!) 169/116   Pulse (!) 112   Temp 99.1 F (37.3 C) (Oral)   Resp 20   Ht 5\' 6"  (1.676 m)   Wt 86.2 kg   LMP 01/19/2023 (Exact Date)   SpO2 100%   BMI 30.67 kg/m  Physical Exam Vitals and nursing note reviewed.  Constitutional:      Appearance: Normal appearance.  HENT:     Head: Normocephalic and atraumatic.     Mouth/Throat:     Mouth: Mucous membranes are moist.     Pharynx: No posterior oropharyngeal erythema.  Eyes:     Conjunctiva/sclera: Conjunctivae normal.     Pupils: Pupils are equal, round, and reactive to light.  Cardiovascular:     Rate and Rhythm: Regular rhythm. Tachycardia present.     Pulses: Normal pulses.  Pulmonary:     Effort: Pulmonary effort is normal.     Breath sounds: Normal breath sounds.  Abdominal:     Palpations: Abdomen is soft.     Tenderness: There is abdominal tenderness.     Comments: Epigastric tenderness to palpation  Skin:    General: Skin is warm and dry.     Findings: No rash.  Neurological:     General: No focal deficit present.     Mental Status: She is alert.     Sensory: Sensory deficit present.     Motor: No weakness.  Psychiatric:        Mood and Affect: Mood normal.        Behavior: Behavior normal.     ED Results / Procedures / Treatments   Labs (all labs ordered are listed, but only abnormal results are displayed) Labs Reviewed  COMPREHENSIVE METABOLIC PANEL - Abnormal; Notable for the following components:      Result Value   Sodium 134 (*)    Chloride 95 (*)    Glucose, Bld 356 (*)    Calcium 10.4 (*)    Total Protein 8.3 (*)    All other components within normal limits  CBC - Abnormal; Notable for the following components:   WBC 10.8 (*)    All other components within normal limits  CBG MONITORING, ED - Abnormal; Notable for  the following components:   Glucose-Capillary 297 (*)    All other components within normal limits  CBG MONITORING, ED - Abnormal; Notable for the following components:   Glucose-Capillary 336 (*)    All other components within normal limits  LIPASE, BLOOD  URINALYSIS, ROUTINE W REFLEX MICROSCOPIC  PREGNANCY, URINE  HCG, SERUM, QUALITATIVE  RAPID URINE DRUG SCREEN, HOSP PERFORMED    EKG EKG Interpretation  Date/Time:  Saturday February 01 2023 16:23:36 EDT  Ventricular Rate:  116 PR Interval:  156 QRS Duration: 73 QT Interval:  334 QTC Calculation: 464 R Axis:   79 Text Interpretation: Sinus tachycardia Probable left atrial enlargement when comaprd to prior, similar appearance. No STEMI Confirmed by Theda Belfast ([id number]) on 02/01/2023 5:31:57 PM  Radiology No results found.  Procedures Procedures: not indicated.   Medications Ordered in ED Medications  ondansetron (ZOFRAN) injection 4 mg (4 mg Intravenous Given 02/01/23 1626)  0.9 %  sodium chloride infusion ( Intravenous New Bag/Given 02/01/23 1657)  dicyclomine (BENTYL) injection 20 mg (20 mg Intramuscular Given 02/01/23 1655)  prochlorperazine (COMPAZINE) injection 10 mg (10 mg Intravenous Given 02/01/23 1746)    ED Course/ Medical Decision Making/ A&P                             Medical Decision Making Amount and/or Complexity of Data Reviewed Labs: ordered.  Risk Prescription drug management.   This patient presents to the ED for concern of abdominal pain and vomiting, this involves an extensive number of treatment options, and is a complaint that carries with it a high risk of complications and morbidity.   Differential diagnosis includes: Gastroparesis versus pancreatitis versus DKA versus cannabinoid hyperemesis versus pregnancy versus gastroenteritis   Co morbidities that complicate the patient evaluation  Gastroparesis Diabetes mellitus Neuropathy   Additional history obtained:  Additional history  obtained from patient's medical records.   Cardiac Monitoring / EKG:  The patient was maintained on a cardiac monitor.  I personally viewed and interpreted the cardiac monitored which showed: tachycardia with a heart rate of 116 bpm.   Lab Tests:  I ordered and personally interpreted labs.  The pertinent results include:  capillary glucose of 297. Lipase within normal limits CMP within normal range for patient. Slightly elevated WBC otherwise unremarkable CBC. The rest of the labs were pending at the time patient left.   Problem List / ED Course / Critical interventions / Medication management  Epigastric pain and vomiting I ordered medications including:  Zofran, Bentyl, and Compazine for nausea and vomiting  Reevaluation of the patient after these medicines showed that the patient  had some relief I have reviewed the patients home medicines and have made adjustments as needed   Social Determinants of Health:  Social connection  Housing   Disposition:  Patient left AMA after a reported emergency with her daughter. She reports that she is the only guardian for her and could not stay to finish fluids or wait for the results of her labs.  She did not look to be in pain nor was she vomiting at the time she left.         Final Clinical Impression(s) / ED Diagnoses Final diagnoses:  Intractable vomiting    Rx / DC Orders ED Discharge Orders     None         Maxwell Marion, PA-C 02/01/23 1835    Tegeler, Canary Brim, MD 02/02/23 0000

## 2023-02-01 NOTE — ED Notes (Signed)
Pt assisted to bedside commode for a BM; provided more nausea medication; assisted back to gurney- call light in reach.

## 2023-02-01 NOTE — ED Notes (Signed)
Pt aware of the need of a urine sample; unable to provide one at this time. Specimen cup is at the bedside.

## 2023-02-01 NOTE — ED Notes (Signed)
ED Provider at bedside. 

## 2023-02-01 NOTE — ED Triage Notes (Signed)
Onset today of vomiting.  Having abd. Pain.  States passed out from vomiting.

## 2023-02-01 NOTE — ED Notes (Addendum)
EDP called to bedside, possible syncopal episode that occurred while RN Theron Arista was conversing with patient. IV fluids increased to 999 bolus

## 2023-02-01 NOTE — ED Notes (Signed)
Upon leaving another patient room I was notified by the PA that the patient had left AMA due to a family emergency. PA advised she removed the patients IV prior to the patient leaving the ED.

## 2023-02-01 NOTE — ED Notes (Signed)
Patient transported to X-ray 

## 2023-02-02 ENCOUNTER — Other Ambulatory Visit: Payer: Self-pay

## 2023-02-02 ENCOUNTER — Emergency Department (HOSPITAL_BASED_OUTPATIENT_CLINIC_OR_DEPARTMENT_OTHER): Payer: BC Managed Care – PPO

## 2023-02-02 ENCOUNTER — Inpatient Hospital Stay (HOSPITAL_BASED_OUTPATIENT_CLINIC_OR_DEPARTMENT_OTHER)
Admission: EM | Admit: 2023-02-02 | Discharge: 2023-02-04 | DRG: 638 | Disposition: A | Discharge status: Home / Self Care | Payer: BC Managed Care – PPO | Attending: Internal Medicine | Admitting: Internal Medicine

## 2023-02-02 ENCOUNTER — Encounter (HOSPITAL_BASED_OUTPATIENT_CLINIC_OR_DEPARTMENT_OTHER): Payer: Self-pay

## 2023-02-02 DIAGNOSIS — N179 Acute kidney failure, unspecified: Secondary | ICD-10-CM | POA: Diagnosis present

## 2023-02-02 DIAGNOSIS — F1290 Cannabis use, unspecified, uncomplicated: Secondary | ICD-10-CM | POA: Diagnosis present

## 2023-02-02 DIAGNOSIS — E1165 Type 2 diabetes mellitus with hyperglycemia: Secondary | ICD-10-CM | POA: Diagnosis present

## 2023-02-02 DIAGNOSIS — R000 Tachycardia, unspecified: Secondary | ICD-10-CM | POA: Diagnosis present

## 2023-02-02 DIAGNOSIS — Z888 Allergy status to other drugs, medicaments and biological substances status: Secondary | ICD-10-CM

## 2023-02-02 DIAGNOSIS — G894 Chronic pain syndrome: Secondary | ICD-10-CM | POA: Diagnosis not present

## 2023-02-02 DIAGNOSIS — Z5329 Procedure and treatment not carried out because of patient's decision for other reasons: Secondary | ICD-10-CM | POA: Diagnosis present

## 2023-02-02 DIAGNOSIS — Z79899 Other long term (current) drug therapy: Secondary | ICD-10-CM

## 2023-02-02 DIAGNOSIS — E1065 Type 1 diabetes mellitus with hyperglycemia: Secondary | ICD-10-CM

## 2023-02-02 DIAGNOSIS — Z8249 Family history of ischemic heart disease and other diseases of the circulatory system: Secondary | ICD-10-CM

## 2023-02-02 DIAGNOSIS — E1043 Type 1 diabetes mellitus with diabetic autonomic (poly)neuropathy: Secondary | ICD-10-CM | POA: Diagnosis present

## 2023-02-02 DIAGNOSIS — R112 Nausea with vomiting, unspecified: Secondary | ICD-10-CM | POA: Diagnosis not present

## 2023-02-02 DIAGNOSIS — K3184 Gastroparesis: Secondary | ICD-10-CM | POA: Diagnosis present

## 2023-02-02 DIAGNOSIS — Z83511 Family history of glaucoma: Secondary | ICD-10-CM

## 2023-02-02 DIAGNOSIS — Z794 Long term (current) use of insulin: Secondary | ICD-10-CM

## 2023-02-02 DIAGNOSIS — E860 Dehydration: Secondary | ICD-10-CM | POA: Diagnosis present

## 2023-02-02 DIAGNOSIS — E1010 Type 1 diabetes mellitus with ketoacidosis without coma: Secondary | ICD-10-CM | POA: Diagnosis present

## 2023-02-02 DIAGNOSIS — L0390 Cellulitis, unspecified: Secondary | ICD-10-CM | POA: Diagnosis present

## 2023-02-02 DIAGNOSIS — G8929 Other chronic pain: Secondary | ICD-10-CM | POA: Diagnosis present

## 2023-02-02 DIAGNOSIS — R1115 Cyclical vomiting syndrome unrelated to migraine: Secondary | ICD-10-CM | POA: Diagnosis present

## 2023-02-02 DIAGNOSIS — E1042 Type 1 diabetes mellitus with diabetic polyneuropathy: Secondary | ICD-10-CM | POA: Diagnosis present

## 2023-02-02 DIAGNOSIS — F419 Anxiety disorder, unspecified: Secondary | ICD-10-CM | POA: Diagnosis present

## 2023-02-02 DIAGNOSIS — F32A Depression, unspecified: Secondary | ICD-10-CM | POA: Diagnosis present

## 2023-02-02 DIAGNOSIS — R9431 Abnormal electrocardiogram [ECG] [EKG]: Secondary | ICD-10-CM | POA: Insufficient documentation

## 2023-02-02 DIAGNOSIS — Z833 Family history of diabetes mellitus: Secondary | ICD-10-CM

## 2023-02-02 LAB — CBC
HCT: 38 % (ref 36.0–46.0)
Hemoglobin: 13.4 g/dL (ref 12.0–15.0)
MCH: 30 pg (ref 26.0–34.0)
MCHC: 35.3 g/dL (ref 30.0–36.0)
MCV: 85.2 fL (ref 80.0–100.0)
Platelets: 438 10*3/uL — ABNORMAL HIGH (ref 150–400)
RBC: 4.46 MIL/uL (ref 3.87–5.11)
RDW: 13.4 % (ref 11.5–15.5)
WBC: 16.3 10*3/uL — ABNORMAL HIGH (ref 4.0–10.5)
nRBC: 0 % (ref 0.0–0.2)

## 2023-02-02 LAB — I-STAT VENOUS BLOOD GAS, ED
Acid-Base Excess: 7 mmol/L — ABNORMAL HIGH (ref 0.0–2.0)
Bicarbonate: 32.5 mmol/L — ABNORMAL HIGH (ref 20.0–28.0)
Calcium, Ion: 1.14 mmol/L — ABNORMAL LOW (ref 1.15–1.40)
HCT: 41 % (ref 36.0–46.0)
Hemoglobin: 13.9 g/dL (ref 12.0–15.0)
O2 Saturation: 77 %
Patient temperature: 98.7
Potassium: 3.8 mmol/L (ref 3.5–5.1)
Sodium: 130 mmol/L — ABNORMAL LOW (ref 135–145)
TCO2: 34 mmol/L — ABNORMAL HIGH (ref 22–32)
pCO2, Ven: 50 mmHg (ref 44–60)
pH, Ven: 7.421 (ref 7.25–7.43)
pO2, Ven: 42 mmHg (ref 32–45)

## 2023-02-02 LAB — BASIC METABOLIC PANEL
Anion gap: 18 — ABNORMAL HIGH (ref 5–15)
Anion gap: 11 (ref 5–15)
BUN: 32 mg/dL — ABNORMAL HIGH (ref 6–20)
BUN: 31 mg/dL — ABNORMAL HIGH (ref 6–20)
CO2: 28 mmol/L (ref 22–32)
CO2: 31 mmol/L (ref 22–32)
Calcium: 10.8 mg/dL — ABNORMAL HIGH (ref 8.9–10.3)
Calcium: 9.3 mg/dL (ref 8.9–10.3)
Chloride: 84 mmol/L — ABNORMAL LOW (ref 98–111)
Chloride: 96 mmol/L — ABNORMAL LOW (ref 98–111)
Creatinine, Ser: 1.31 mg/dL — ABNORMAL HIGH (ref 0.44–1.00)
Creatinine, Ser: 0.98 mg/dL (ref 0.44–1.00)
GFR, Estimated: 54 mL/min — ABNORMAL LOW (ref >60)
GFR, Estimated: >60 mL/min (ref >60)
Glucose, Bld: 699 mg/dL (ref 70–99)
Glucose, Bld: 313 mg/dL — ABNORMAL HIGH (ref 70–99)
Potassium: 3.8 mmol/L (ref 3.5–5.1)
Potassium: 3.7 mmol/L (ref 3.5–5.1)
Sodium: 130 mmol/L — ABNORMAL LOW (ref 135–145)
Sodium: 138 mmol/L (ref 135–145)

## 2023-02-02 LAB — GLUCOSE, CAPILLARY
Glucose-Capillary: 148 mg/dL — ABNORMAL HIGH (ref 70–99)
Glucose-Capillary: 183 mg/dL — ABNORMAL HIGH (ref 70–99)

## 2023-02-02 LAB — LIPASE, BLOOD: Lipase: <10 U/L — ABNORMAL LOW (ref 11–51)

## 2023-02-02 LAB — CBG MONITORING, ED
Glucose-Capillary: >600 mg/dL (ref 70–99)
Glucose-Capillary: 566 mg/dL (ref 70–99)
Glucose-Capillary: 438 mg/dL — ABNORMAL HIGH (ref 70–99)
Glucose-Capillary: 383 mg/dL — ABNORMAL HIGH (ref 70–99)
Glucose-Capillary: 243 mg/dL — ABNORMAL HIGH (ref 70–99)
Glucose-Capillary: 238 mg/dL — ABNORMAL HIGH (ref 70–99)

## 2023-02-02 LAB — HEPATIC FUNCTION PANEL
ALT: 23 U/L (ref 0–44)
AST: 12 U/L — ABNORMAL LOW (ref 15–41)
Albumin: 4.9 g/dL (ref 3.5–5.0)
Alkaline Phosphatase: 109 U/L (ref 38–126)
Bilirubin, Direct: 0.1 mg/dL (ref 0.0–0.2)
Indirect Bilirubin: 0.6 mg/dL (ref 0.3–0.9)
Total Bilirubin: 0.7 mg/dL (ref 0.3–1.2)
Total Protein: 8.4 g/dL — ABNORMAL HIGH (ref 6.5–8.1)

## 2023-02-02 LAB — MRSA NEXT GEN BY PCR, NASAL: MRSA by PCR Next Gen: NOT DETECTED

## 2023-02-02 LAB — BETA-HYDROXYBUTYRIC ACID: Beta-Hydroxybutyric Acid: 1.64 mmol/L — ABNORMAL HIGH (ref 0.05–0.27)

## 2023-02-02 MED ORDER — CHLORHEXIDINE GLUCONATE CLOTH 2 % EX PADS
6.0000 | MEDICATED_PAD | Freq: Every day | CUTANEOUS | Status: DC
  Administered 2023-02-03: 6 via TOPICAL

## 2023-02-02 MED ORDER — PROMETHAZINE HCL 25 MG/ML IJ SOLN
INTRAMUSCULAR | Status: AC
  Filled 2023-02-02: qty 1

## 2023-02-02 MED ORDER — OXYCODONE HCL 5 MG PO TABS
10.0000 mg | ORAL_TABLET | Freq: Three times a day (TID) | ORAL | Status: DC | PRN
  Administered 2023-02-03 – 2023-02-04 (×3): 10 mg via ORAL
  Filled 2023-02-02 (×3): qty 2

## 2023-02-02 MED ORDER — SODIUM CHLORIDE 0.9 % IV BOLUS
1000.0000 mL | Freq: Once | INTRAVENOUS | Status: AC
  Administered 2023-02-02: 1000 mL via INTRAVENOUS

## 2023-02-02 MED ORDER — HYDROMORPHONE HCL 1 MG/ML IJ SOLN
1.0000 mg | Freq: Once | INTRAMUSCULAR | Status: AC
  Administered 2023-02-02: 1 mg via INTRAVENOUS
  Filled 2023-02-02: qty 1

## 2023-02-02 MED ORDER — LACTATED RINGERS IV BOLUS
20.0000 mL/kg | Freq: Once | INTRAVENOUS | Status: AC
  Administered 2023-02-02: 1724 mL via INTRAVENOUS

## 2023-02-02 MED ORDER — POTASSIUM CHLORIDE 10 MEQ/100ML IV SOLN
10.0000 meq | INTRAVENOUS | Status: AC
  Administered 2023-02-02 (×2): 10 meq via INTRAVENOUS
  Filled 2023-02-02 (×2): qty 100

## 2023-02-02 MED ORDER — SODIUM CHLORIDE 0.9 % IV SOLN
25.0000 mg | Freq: Four times a day (QID) | INTRAVENOUS | Status: DC | PRN
  Administered 2023-02-02 – 2023-02-04 (×5): 25 mg via INTRAVENOUS
  Filled 2023-02-02: qty 25
  Filled 2023-02-02 (×2): qty 1
  Filled 2023-02-02 (×2): qty 25

## 2023-02-02 MED ORDER — DEXTROSE IN LACTATED RINGERS 5 % IV SOLN: INTRAVENOUS | Status: DC

## 2023-02-02 MED ORDER — DEXTROSE 50 % IV SOLN: 0.0000 mL | INTRAVENOUS | Status: DC | PRN

## 2023-02-02 MED ORDER — LACTATED RINGERS IV SOLN: INTRAVENOUS | Status: DC

## 2023-02-02 MED ORDER — ROSUVASTATIN CALCIUM 10 MG PO TABS
10.0000 mg | ORAL_TABLET | Freq: Every day | ORAL | Status: DC
  Administered 2023-02-03 – 2023-02-04 (×2): 10 mg via ORAL
  Filled 2023-02-02 (×2): qty 1

## 2023-02-02 MED ORDER — TRAZODONE HCL 50 MG PO TABS: 150.0000 mg | ORAL_TABLET | Freq: Every evening | ORAL | Status: DC | PRN

## 2023-02-02 MED ORDER — ENOXAPARIN SODIUM 40 MG/0.4ML IJ SOSY
40.0000 mg | PREFILLED_SYRINGE | INTRAMUSCULAR | Status: DC
  Administered 2023-02-03 – 2023-02-04 (×2): 40 mg via SUBCUTANEOUS
  Filled 2023-02-02 (×2): qty 0.4

## 2023-02-02 MED ORDER — DULOXETINE HCL 60 MG PO CPEP
60.0000 mg | ORAL_CAPSULE | Freq: Every day | ORAL | Status: DC
  Administered 2023-02-03 – 2023-02-04 (×2): 60 mg via ORAL
  Filled 2023-02-02: qty 1
  Filled 2023-02-02: qty 2

## 2023-02-02 MED ORDER — INSULIN REGULAR(HUMAN) IN NACL 100-0.9 UT/100ML-% IV SOLN
INTRAVENOUS | Status: DC
  Administered 2023-02-02: 10 [IU]/h via INTRAVENOUS
  Filled 2023-02-02: qty 100

## 2023-02-02 MED ORDER — AMITRIPTYLINE HCL 25 MG PO TABS
25.0000 mg | ORAL_TABLET | Freq: Every day | ORAL | Status: DC
  Administered 2023-02-03 (×2): 25 mg via ORAL
  Filled 2023-02-02 (×2): qty 1

## 2023-02-02 NOTE — ED Notes (Signed)
Report given to ICU RN at [facility] receiving Pt care.

## 2023-02-02 NOTE — ED Provider Notes (Signed)
[place] EMERGENCY DEPARTMENT AT [facility] Provider Note   CSN: [id number] Arrival date & time: 02/02/23  1620     History  Chief Complaint  Patient presents with   Hyperglycemia    [name] is a 37 y.o. female.  The history is provided by the patient and medical records. No language interpreter was used.  Hyperglycemia Blood sugar level PTA:  High Severity:  Severe Onset quality:  Gradual Duration:  2 days Timing:  Constant Progression:  Worsening Chronicity:  Recurrent Diabetes status:  Controlled with insulin Relieved by:  Nothing Ineffective treatments:  None tried Associated symptoms: altered mental status (fatigue), dehydration, fatigue, increased thirst, nausea and vomiting   Associated symptoms: no abdominal pain (less pain today), no chest pain, no dysuria, no fever and no shortness of breath   Risk factors: hx of DKA        Home Medications Prior to Admission medications   Medication Sig Start Date End Date Taking? Authorizing Provider  amitriptyline (ELAVIL) 25 MG tablet Take 25 mg by mouth at bedtime. 06/26/22   [provider]  Continuous Blood Gluc Transmit (DEXCOM G6 TRANSMITTER) MISC Inject 1 each into the skin every 14 (fourteen) days. 12/31/21   Dulce Sellar, NP  cyanocobalamin (VITAMIN B12) 1000 MCG/ML injection Inject 1,000 mcg into the muscle every 30 (thirty) days. 08/31/22   [provider]  DULoxetine (CYMBALTA) 60 MG capsule Take 60 mg by mouth daily. 06/28/22   [provider]  gabapentin (NEURONTIN) 400 MG capsule Take 1 capsule (400 mg total) by mouth 3 (three) times daily. Patient not taking: Reported on 05/07/2022 03/10/22   Erick Blinks, MD  hydrOXYzine (ATARAX) 25 MG tablet Take 1 tablet (25 mg total) by mouth at bedtime as needed (insomnia). Patient not taking: Reported on 12/29/2022 06/03/22   Pollyann Savoy, MD  insulin aspart (NOVOLOG) 100 UNIT/ML FlexPen Inject 0-18 Units  into the skin 3 (three) times daily with meals. Check Blood Glucose (BG) and inject per scale: BG <150= 0 unit; BG 150-200= 3 unit; BG 201-250= 6 unit; BG 251-300= 9 unit; BG 301-350= 12 unit; BG 351-400= 15 unit; BG >400= 18 unit and Call Primary Care. 12/30/22 03/30/23  Amin, Loura Halt, MD  insulin detemir (LEVEMIR FLEXTOUCH) 100 UNIT/ML FlexPen 30 units daily-- will need to adjust as you intake at home varies Patient taking differently: Inject 20 Units into the skin daily. 05/10/22   Joseph Art, DO  Insulin Pen Needle (B-D UF III MINI PEN NEEDLES) 31G X 5 MM MISC To use with Insulin pens, as directed 11/20/21   Dulce Sellar, NP  Insulin Pen Needle (PEN NEEDLES) 31G X 5 MM MISC 1 each by Does not apply route 3 (three) times daily. May dispense any manufacturer covered by patient's insurance. 12/30/22   Amin, Loura Halt, MD  Insulin Syringes, Disposable, U-100 1 ML MISC Inject 1 each into the skin in the morning and at bedtime. 08/10/21   Dulce Sellar, NP  metoCLOPramide (REGLAN) 10 MG tablet Take 1 tablet (10 mg total) by mouth every 6 (six) hours as needed for nausea or vomiting. 12/31/22   Netta Corrigan, PA-C  metroNIDAZOLE (FLAGYL) 500 MG tablet Take 500 mg by mouth 2 (two) times daily.    [provider]  Oxycodone HCl 10 MG TABS Take 10 mg by mouth daily as needed (For pain). 08/07/22   [provider]  pantoprazole (PROTONIX) 40 MG tablet Take 1 tablet (40 mg  total) by mouth daily. 05/10/22   Joseph Art, DO  promethazine (PHENERGAN) 25 MG tablet Take 1 tablet (25 mg total) by mouth every 8 (eight) hours as needed for nausea or vomiting. 12/30/22   Amin, Loura Halt, MD  rosuvastatin (CRESTOR) 10 MG tablet Take 10 mg by mouth daily. 07/04/22   [provider]  topiramate (TOPAMAX) 50 MG tablet Take 50 mg by mouth daily. 04/29/22   [provider]  traZODone (DESYREL) 150 MG tablet Take 150 mg by mouth at bedtime.    [provider]       Allergies    Pramipexole    Review of Systems   Review of Systems  Constitutional:  Positive for fatigue. Negative for chills and fever.  HENT:  Negative for congestion.   Respiratory:  Negative for cough, chest tightness, shortness of breath and wheezing.   Cardiovascular:  Negative for chest pain, palpitations and leg swelling.  Gastrointestinal:  Positive for diarrhea, nausea and vomiting. Negative for abdominal pain (less pain today) and constipation.  Endocrine: Positive for polydipsia.  Genitourinary:  Negative for dysuria and flank pain.  Musculoskeletal:  Positive for back pain (pain all over). Negative for neck pain and neck stiffness.  Skin:  Negative for rash and wound.  Neurological:  Negative for headaches.  Psychiatric/Behavioral:  Negative for agitation.   All other systems reviewed and are negative.   Physical Exam Updated Vital Signs BP 106/72 (BP Location: Right Arm)   Pulse (!) 136   Temp 98.7 F (37.1 C)   Resp 18   Ht 5\' 6"  (1.676 m)   Wt 86.2 kg   LMP 01/19/2023 (Exact Date)   SpO2 100%   BMI 30.67 kg/m  Physical Exam Vitals and nursing note reviewed.  Constitutional:      General: She is not in acute distress.    Appearance: She is well-developed. She is ill-appearing. She is not toxic-appearing or diaphoretic.  HENT:     Head: Normocephalic and atraumatic.     Nose: No congestion or rhinorrhea.     Mouth/Throat:     Mouth: Mucous membranes are dry.     Pharynx: No oropharyngeal exudate or posterior oropharyngeal erythema.  Eyes:     Extraocular Movements: Extraocular movements intact.     Conjunctiva/sclera: Conjunctivae normal.     Pupils: Pupils are equal, round, and reactive to light.  Cardiovascular:     Rate and Rhythm: Regular rhythm. Tachycardia present.     Pulses: Normal pulses.     Heart sounds: No murmur heard. Pulmonary:     Effort: Pulmonary effort is normal. No respiratory distress.     Breath sounds: Normal breath  sounds. No wheezing, rhonchi or rales.  Chest:     Chest wall: No tenderness.  Abdominal:     General: Abdomen is flat.     Palpations: Abdomen is soft.     Tenderness: There is no abdominal tenderness. There is no guarding or rebound.  Musculoskeletal:        General: No swelling or tenderness.     Cervical back: Neck supple. No tenderness.     Right lower leg: No edema.     Left lower leg: No edema.  Skin:    General: Skin is warm and dry.     Capillary Refill: Capillary refill takes less than 2 seconds.     Findings: No erythema or rash.  Neurological:     General: No focal deficit present.  Mental Status: She is alert.  Psychiatric:        Mood and Affect: Mood normal.     ED Results / Procedures / Treatments   Labs (all labs ordered are listed, but only abnormal results are displayed) Labs Reviewed  BASIC METABOLIC PANEL - Abnormal; Notable for the following components:      Result Value   Sodium 130 (*)    Chloride 84 (*)    Glucose, Bld 699 (*)    BUN 32 (*)    Creatinine, Ser 1.31 (*)    Calcium 10.8 (*)    GFR, Estimated 54 (*)    Anion gap 18 (*)    All other components within normal limits  CBC - Abnormal; Notable for the following components:   WBC 16.3 (*)    Platelets 438 (*)    All other components within normal limits  LIPASE, BLOOD - Abnormal; Notable for the following components:   Lipase <10 (*)    All other components within normal limits  HEPATIC FUNCTION PANEL - Abnormal; Notable for the following components:   Total Protein 8.4 (*)    AST 12 (*)    All other components within normal limits  BETA-HYDROXYBUTYRIC ACID - Abnormal; Notable for the following components:   Beta-Hydroxybutyric Acid 1.64 (*)    All other components within normal limits  BASIC METABOLIC PANEL - Abnormal; Notable for the following components:   Chloride 96 (*)    Glucose, Bld 313 (*)    BUN 31 (*)    All other components within normal limits  CBG MONITORING, ED  - Abnormal; Notable for the following components:   Glucose-Capillary >600 (*)    All other components within normal limits  CBG MONITORING, ED - Abnormal; Notable for the following components:   Glucose-Capillary 566 (*)    All other components within normal limits  I-STAT VENOUS BLOOD GAS, ED - Abnormal; Notable for the following components:   Bicarbonate 32.5 (*)    TCO2 34 (*)    Acid-Base Excess 7.0 (*)    Sodium 130 (*)    Calcium, Ion 1.14 (*)    All other components within normal limits  CBG MONITORING, ED - Abnormal; Notable for the following components:   Glucose-Capillary 438 (*)    All other components within normal limits  CBG MONITORING, ED - Abnormal; Notable for the following components:   Glucose-Capillary 383 (*)    All other components within normal limits  CBG MONITORING, ED - Abnormal; Notable for the following components:   Glucose-Capillary 243 (*)    All other components within normal limits  CBG MONITORING, ED - Abnormal; Notable for the following components:   Glucose-Capillary 238 (*)    All other components within normal limits  URINALYSIS, ROUTINE W REFLEX MICROSCOPIC  PREGNANCY, URINE  BASIC METABOLIC PANEL  BASIC METABOLIC PANEL  BASIC METABOLIC PANEL  BASIC METABOLIC PANEL    EKG EKG Interpretation  Date/Time:  Sunday February 02 2023 16:41:32 EDT Ventricular Rate:  127 PR Interval:  122 QRS Duration: 70 QT Interval:  342 QTC Calculation: 497 R Axis:   90 Text Interpretation: Sinus tachycardia Rightward axis Borderline ECG When compared with ECG of 01-Feb-2023 16:23, PREVIOUS ECG IS PRESENT when compared to prior, faster rate. No STEMI Confirmed by Theda Belfast ([id number]) on 02/02/2023 4:55:01 PM  Radiology DG Chest Portable 1 View  Result Date: 02/02/2023 CLINICAL DATA:  Hypoxia, dizziness EXAM: PORTABLE CHEST 1 VIEW COMPARISON:  06/02/2022 FINDINGS: Cardiac  size is within normal limits. Lung fields are clear of any infiltrates or pulmonary  edema. There is no pleural effusion or pneumothorax. IMPRESSION: No active cardiopulmonary disease. Electronically Signed   By: Ernie Avena M.D.   On: 02/02/2023 18:51    Procedures Procedures    CRITICAL CARE Performed by: Canary Brim Calianna Kim Total critical care time: 35 minutes Critical care time was exclusive of separately billable procedures and treating other patients. Critical care was necessary to treat or prevent imminent or life-threatening deterioration. Critical care was time spent personally by me on the following activities: development of treatment plan with patient and/or surrogate as well as nursing, discussions with consultants, evaluation of patient's response to treatment, examination of patient, obtaining history from patient or surrogate, ordering and performing treatments and interventions, ordering and review of laboratory studies, ordering and review of radiographic studies, pulse oximetry and re-evaluation of patient's condition.   Medications Ordered in ED Medications  promethazine (PHENERGAN) 25 mg in sodium chloride 0.9 % 50 mL IVPB (0 mg Intravenous Stopped 02/02/23 1755)  promethazine (PHENERGAN) 25 MG/ML injection (has no administration in time range)  insulin regular, human (MYXREDLIN) 100 units/ 100 mL infusion (3.8 Units/hr Intravenous Infusion Verify 02/02/23 2142)  lactated ringers infusion (0 mLs Intravenous Stopped 02/02/23 2039)  dextrose 5 % in lactated ringers infusion ( Intravenous New Bag/Given 02/02/23 2041)  dextrose 50 % solution 0-50 mL (has no administration in time range)  sodium chloride 0.9 % bolus 1,000 mL (0 mLs Intravenous Stopped 02/02/23 1900)  HYDROmorphone (DILAUDID) injection 1 mg (1 mg Intravenous Given 02/02/23 1736)  lactated ringers bolus 1,724 mL (0 mLs Intravenous Stopped 02/02/23 2014)  potassium chloride 10 mEq in 100 mL IVPB (0 mEq Intravenous Stopped 02/02/23 2042)    ED Course/ Medical Decision Making/ A&P                              Medical Decision Making Amount and/or Complexity of Data Reviewed Labs: ordered. Radiology: ordered.  Risk Prescription drug management. Decision regarding hospitalization.    [name] is a 37 y.o. female with a past medical history significant for type 1 diabetes, previous DKA, gastroparesis, chronic abdominal pain, and diabetic neuropathies who presents with recurrent nausea, vomiting, diffuse body pain, fatigue, and feeling dehydrated.  Patient reports this is gone over the last few days.  She reports that she came to the emergency department yesterday and began to feel better and had to deal with a family emergency and left AGAINST MEDICAL ADVICE.  She said that that is now taking care of and she continued to feel worse so she presents back again for evaluation.  She says that her glucoses have been elevated and she is having nausea and vomiting.  She is feeling dehydrated and has had some diarrhea as well.  She denies any fevers or chills or cough and denies any chest pain.  She does report some mild shortness of breath.  Denies any aspiration events to her knowledge.  She is denying any abdominal pain at this time but reports he is hurting all over.  She reports no urinary changes.  She is very thirsty.  On exam, lungs clear.  Chest nontender.  Abdomen was nontender.  She had some tenderness on her back and in her extremities.  Intact pulses.  Dry mucous membranes.  Patient ill-appearing and is very tachycardic in the 130s and 140s on my first evaluation.  Blood pressure is around 100.  She is somnolent but arousable to loud voice.  Clinically I am concerned about DKA as her initial point-of-care glucose read high and greater than 600.  Will get DKA labs and other workup.  Will give her pain medicine nausea medicine and fluids.  Will get workup to look for occult infection and get a chest x-ray as her oxygen did drop into the 80s.  Will get urinalysis as  well.  As this is the second day of coming to emergency department with acutely worsening symptoms, anticipate she will likely admission.        5:44 PM Patient reassessed and she is not hypoxic in the mid 80s.  He went down to 81%.  Will place on oxygen.  Her labs are beginning to return and she does appear to be in DKA.  Her anion gap has worsened since yesterday now up to 18 and her glucose was 699.  She does have leukocytosis.  She denies any aspiration or coughing.  She will get the rest of her workup for DKA and rule out occult infection causing this however with 2 days with worsening symptoms, somnolence, and ill appearance, do not feel she is safe for discharge home.  Will admit for DKA after workup is completed.        Final Clinical Impression(s) / ED Diagnoses Final diagnoses:  Diabetic ketoacidosis without coma associated with type 1 diabetes mellitus (HCC)  Nausea and vomiting, unspecified vomiting type     Clinical Impression: 1. Diabetic ketoacidosis without coma associated with type 1 diabetes mellitus (HCC)   2. Nausea and vomiting, unspecified vomiting type     Disposition: Admit  This note was prepared with assistance of Dragon voice recognition software. Occasional wrong-word or sound-a-like substitutions may have occurred due to the inherent limitations of voice recognition software.     Makaylynn Bonillas, Canary Brim, MD 02/02/23 2216

## 2023-02-02 NOTE — ED Notes (Signed)
While in triage patient became unresponsive closing eyes for several seconds and would not respond to sternal rub.  When woke up was responding without difficultly but trying to lean forward about to fall out of chair several time.  Assisted back into chair and instructed to stay leaning back so as not to fall.  Patient keep stating feels tired.  Assisted to room in wheel chair.  In bed with two side rails up call bell in reach.  MD notified

## 2023-02-02 NOTE — ED Notes (Signed)
CRITICAL VALUE STICKER  CRITICAL VALUE:Glucose 699  RECEIVER (on-site recipient of call):Carmon Ginsberg, RN  DATE & TIME NOTIFIED:   MESSENGER (representative from lab):  MD NOTIFIED: Dr. Rush Landmark  TIME OF NOTIFICATION:1724  RESPONSE:

## 2023-02-02 NOTE — H&P (Signed)
History and Physical    [name] UUV:[id number] DOB: Jul 28, 1986 DOA: 02/02/2023  PCP: Dulce Sellar, NP   Patient coming from: Home   Chief Complaint: N/V   HPI: [name] is a 37 y.o. female with medical history significant for insulin-dependent diabetes mellitus, cyclic vomiting syndrome, chronic pain with opiate dependence, and diabetic neuropathy who presents to the ED with nausea and vomiting.  She reports developing nausea with recurrent episodes of non-bloody vomiting on 02/01/2023. She was having abdominal pain associated with this yesterday, but denies pain today. Her glucometer is broken but she reports continued use of her long- and short-acting insulin as prescribed. She denies fevers or chills.   [facility] ED Course: Upon arrival to the ED, patient is found to be afebrile and saturating well on room air initially with elevated heart rate and stable blood pressure.  Labs are most notable for glucose 699, normal bicarbonate, creatinine 1.31, WBC 16,300, and platelets 438,000.  She was given a liter of normal saline, 1.7 L of LR, IV potassium, Dilaudid, Phenergan, and was started on IV insulin infusion.  She was transferred to [facility] for admission.  Review of Systems:  All other systems reviewed and apart from HPI, are negative.  Past Medical History:  Diagnosis Date   Chronic constipation 11/08/2021   Diabetic neuropathy (HCC)    History of chicken pox    History of noncompliance with medical treatment 09/07/2022   Type 1 diabetes (HCC)    Vaginal odor 11/08/2021    Past Surgical History:  Procedure Laterality Date   CESAREAN SECTION     only one C/S per pt.   CO2 LASER APPLICATION N/A 02/22/2022   Procedure: CO2 LASER APPLICATION TO VULVA;  Surgeon: Carver Fila, MD;  Location: [facility];  Service: General;  Laterality: N/A;   DILATION AND CURETTAGE OF UTERUS     EXCISION OF SKIN TAG N/A  02/22/2022   Procedure: EXCISION OF ANAL SKIN TAGS;  Surgeon: Romie Levee, MD;  Location: [facility];  Service: General;  Laterality: N/A;    Social History:   reports that she has never smoked. She has never been exposed to tobacco smoke. She has never used smokeless tobacco. She reports that she does not drink alcohol and does not use drugs.  Allergies  Allergen Reactions   Pramipexole Shortness Of Breath    Family History  Problem Relation Age of Onset   Hypertension Mother    Glaucoma Mother    Diabetes Maternal Grandmother    Neuropathy Neg Hx    Colon cancer Neg Hx    Stomach cancer Neg Hx    Esophageal cancer Neg Hx    Colon polyps Neg Hx    Rectal cancer Neg Hx      Prior to Admission medications   Medication Sig Start Date End Date Taking? Authorizing Provider  amitriptyline (ELAVIL) 25 MG tablet Take 25 mg by mouth at bedtime. 06/26/22   [provider]  Continuous Blood Gluc Transmit (DEXCOM G6 TRANSMITTER) MISC Inject 1 each into the skin every 14 (fourteen) days. 12/31/21   Dulce Sellar, NP  cyanocobalamin (VITAMIN B12) 1000 MCG/ML injection Inject 1,000 mcg into the muscle every 30 (thirty) days. 08/31/22   [provider]  DULoxetine (CYMBALTA) 60 MG capsule Take 60 mg by mouth daily. 06/28/22   [provider]  gabapentin (NEURONTIN) 400 MG capsule Take 1 capsule (400 mg total) by mouth 3 (  three) times daily. Patient not taking: Reported on 05/07/2022 03/10/22   Erick Blinks, MD  hydrOXYzine (ATARAX) 25 MG tablet Take 1 tablet (25 mg total) by mouth at bedtime as needed (insomnia). Patient not taking: Reported on 12/29/2022 06/03/22   Pollyann Savoy, MD  insulin aspart (NOVOLOG) 100 UNIT/ML FlexPen Inject 0-18 Units into the skin 3 (three) times daily with meals. Check Blood Glucose (BG) and inject per scale: BG <150= 0 unit; BG 150-200= 3 unit; BG 201-250= 6 unit; BG 251-300= 9 unit; BG 301-350= 12 unit; BG  351-400= 15 unit; BG >400= 18 unit and Call Primary Care. 12/30/22 03/30/23  Amin, Loura Halt, MD  insulin detemir (LEVEMIR FLEXTOUCH) 100 UNIT/ML FlexPen 30 units daily-- will need to adjust as you intake at home varies Patient taking differently: Inject 20 Units into the skin daily. 05/10/22   Joseph Art, DO  Insulin Pen Needle (B-D UF III MINI PEN NEEDLES) 31G X 5 MM MISC To use with Insulin pens, as directed 11/20/21   Dulce Sellar, NP  Insulin Pen Needle (PEN NEEDLES) 31G X 5 MM MISC 1 each by Does not apply route 3 (three) times daily. May dispense any manufacturer covered by patient's insurance. 12/30/22   Amin, Loura Halt, MD  Insulin Syringes, Disposable, U-100 1 ML MISC Inject 1 each into the skin in the morning and at bedtime. 08/10/21   Dulce Sellar, NP  metoCLOPramide (REGLAN) 10 MG tablet Take 1 tablet (10 mg total) by mouth every 6 (six) hours as needed for nausea or vomiting. 12/31/22   Netta Corrigan, PA-C  metroNIDAZOLE (FLAGYL) 500 MG tablet Take 500 mg by mouth 2 (two) times daily.    [provider]  Oxycodone HCl 10 MG TABS Take 10 mg by mouth daily as needed (For pain). 08/07/22   [provider]  pantoprazole (PROTONIX) 40 MG tablet Take 1 tablet (40 mg total) by mouth daily. 05/10/22   Joseph Art, DO  promethazine (PHENERGAN) 25 MG tablet Take 1 tablet (25 mg total) by mouth every 8 (eight) hours as needed for nausea or vomiting. 12/30/22   Amin, Loura Halt, MD  rosuvastatin (CRESTOR) 10 MG tablet Take 10 mg by mouth daily. 07/04/22   [provider]  topiramate (TOPAMAX) 50 MG tablet Take 50 mg by mouth daily. 04/29/22   [provider]  traZODone (DESYREL) 150 MG tablet Take 150 mg by mouth at bedtime.    [provider]    Physical Exam: Vitals:   02/02/23 2115 02/02/23 2130 02/02/23 2143 02/02/23 2224  BP: 111/74 130/82  (!) 152/87  Pulse: (!) 117 (!) 115  (!) 117  Resp:    15  Temp:   98.3 F (36.8  C)   TempSrc:   Oral Oral  SpO2: 100% 100%  100%  Weight:      Height:         Constitutional: NAD, no pallor or diaphoresis   Eyes: PERTLA, lids and conjunctivae normal ENMT: Mucous membranes are moist. Posterior pharynx clear of any exudate or lesions.   Neck: supple, no masses  Respiratory: no wheezing, no crackles. No accessory muscle use.  Cardiovascular: S1 & S2 heard, regular rate and rhythm. No extremity edema.   Abdomen: No distension, no tenderness, soft. Bowel sounds active.  Musculoskeletal: no clubbing / cyanosis. No joint deformity upper and lower extremities.   Skin: no significant rashes, lesions, ulcers. Warm, dry, well-perfused. Neurologic: CN 2-12 grossly intact.Moving all extremities. Sleeping,  wakes to voice and is fully oriented but quickly falls back asleep during interview.    Labs and Imaging on Admission: I have personally reviewed following labs and imaging studies  CBC: Recent Labs  Lab 02/01/23 1626 02/02/23 1652 02/02/23 1811  WBC 10.8* 16.3*  --   HGB 13.4 13.4 13.9  HCT 38.4 38.0 41.0  MCV 87.3 85.2  --   PLT 344 438*  --    Basic Metabolic Panel: Recent Labs  Lab 02/01/23 1626 02/02/23 1652 02/02/23 1811 02/02/23 2125  NA 134* 130* 130* 138  K 3.7 3.8 3.8 3.7  CL 95* 84*  --  96*  CO2 24 28  --  31  GLUCOSE 356* 699*  --  313*  BUN 13 32*  --  31*  CREATININE 0.86 1.31*  --  0.98  CALCIUM 10.4* 10.8*  --  9.3   GFR: Estimated Creatinine Clearance: 87.8 mL/min (by C-G formula based on SCr of 0.98 mg/dL). Liver Function Tests: Recent Labs  Lab 02/01/23 1626 02/02/23 1655  AST 15 12*  ALT 28 23  ALKPHOS 106 109  BILITOT 0.6 0.7  PROT 8.3* 8.4*  ALBUMIN 4.7 4.9   Recent Labs  Lab 02/01/23 1626 02/02/23 1655  LIPASE 13 <10*   No results for input(s): "AMMONIA" in the last 168 hours. Coagulation Profile: No results for input(s): "INR", "PROTIME" in the last 168 hours. Cardiac Enzymes: No results for input(s):  "CKTOTAL", "CKMB", "CKMBINDEX", "TROPONINI" in the last 168 hours. BNP (last 3 results) No results for input(s): "PROBNP" in the last 8760 hours. HbA1C: No results for input(s): "HGBA1C" in the last 72 hours. CBG: Recent Labs  Lab 02/02/23 1854 02/02/23 1935 02/02/23 2037 02/02/23 2139 02/02/23 2239  GLUCAP 438* 383* 243* 238* 183*   Lipid Profile: No results for input(s): "CHOL", "HDL", "LDLCALC", "TRIG", "CHOLHDL", "LDLDIRECT" in the last 72 hours. Thyroid Function Tests: No results for input(s): "TSH", "T4TOTAL", "FREET4", "T3FREE", "THYROIDAB" in the last 72 hours. Anemia Panel: No results for input(s): "VITAMINB12", "FOLATE", "FERRITIN", "TIBC", "IRON", "RETICCTPCT" in the last 72 hours. Urine analysis:    Component Value Date/Time   COLORURINE COLORLESS (A) 12/31/2022 1505   APPEARANCEUR CLEAR 12/31/2022 1505   LABSPEC 1.011 12/31/2022 1505   PHURINE 8.0 12/31/2022 1505   GLUCOSEU 500 (A) 12/31/2022 1505   HGBUR NEGATIVE 12/31/2022 1505   BILIRUBINUR NEGATIVE 12/31/2022 1505   BILIRUBINUR negative 11/08/2021 1424   KETONESUR NEGATIVE 12/31/2022 1505   PROTEINUR TRACE (A) 12/31/2022 1505   UROBILINOGEN 0.2 11/08/2021 1424   UROBILINOGEN 0.2 05/03/2021 0837   NITRITE NEGATIVE 12/31/2022 1505   LEUKOCYTESUR NEGATIVE 12/31/2022 1505   Sepsis Labs: @LABRCNTIP (procalcitonin:4,lacticidven:4) ) Recent Results (from the past 240 hour(s))  MRSA Next Gen by PCR, Nasal     Status: None   Collection Time: 02/02/23 10:19 PM   Specimen: Nasal Mucosa; Nasal Swab  Result Value Ref Range Status   MRSA by PCR Next Gen NOT DETECTED NOT DETECTED Final    Comment: (NOTE) The GeneXpert MRSA Assay (FDA approved for NASAL specimens only), is one component of a comprehensive MRSA colonization surveillance program. It is not intended to diagnose MRSA infection nor to guide or monitor treatment for MRSA infections. Test performance is not FDA approved in patients less than 9  years old. Performed at [facility], [address], [place], [place] [address]      Radiological Exams on Admission: DG Chest Portable 1 View  Result Date: 02/02/2023 CLINICAL DATA:  Hypoxia, dizziness EXAM: PORTABLE CHEST 1 VIEW COMPARISON:  06/02/2022 FINDINGS: Cardiac size is within normal limits. Lung fields are clear of any infiltrates or pulmonary edema. There is no pleural effusion or pneumothorax. IMPRESSION: No active cardiopulmonary disease. Electronically Signed   By: Ernie Avena M.D.   On: 02/02/2023 18:51    EKG: Independently reviewed. Sinus tachycardia, rate 127, RAD, QTc 498.   Assessment/Plan   1. Uncontrolled IDDM with hyperglycemia  - A1c was 12.2% in May 2024; serum glucose 699 in ED without acidosis  - Continue IVF hydration and IV insulin infusion with frequent CBGs and serial chem panels    2. AKI  - SCr is 1.31 on admission, up from 0.86 the day prior  - Prerenal in setting of N/V and osmotic diuresis, anticipate rapid improvement  - Continue IVF hydration, repeat chem panel    3. Chronic pain  - Patient denied pain and is falling asleep during interview but insisting on IV Dilaudid for her neuropathy  - Will stick to her home regimen, prescription database reviewed   4. Nausea & vomiting  - Exam is benign, anticipate improvement with glycemic-control - Continue IVF hydration and monitor electrolytes    DVT prophylaxis: Lovenox  Code Status: Full  Level of Care: Level of care: Stepdown Family Communication: None present  Disposition Plan:  Patient is from: home  Anticipated d/c is to: Home  Anticipated d/c date is: 02/04/23  Patient currently: Pending glycemic-control, transition to sq insulin, stable electrolytes and renal function  Consults called: None Admission status: inpatient     Briscoe Deutscher, MD [facility]  02/02/2023, 11:36 PM

## 2023-02-02 NOTE — ED Triage Notes (Signed)
States glucose monitor broken and has not been able to check glucose.  States feels dizziness and weakness

## 2023-02-03 DIAGNOSIS — E1165 Type 2 diabetes mellitus with hyperglycemia: Secondary | ICD-10-CM | POA: Diagnosis not present

## 2023-02-03 LAB — BASIC METABOLIC PANEL
Anion gap: 9 (ref 5–15)
Anion gap: 10 (ref 5–15)
Anion gap: 9 (ref 5–15)
BUN: 27 mg/dL — ABNORMAL HIGH (ref 6–20)
BUN: 25 mg/dL — ABNORMAL HIGH (ref 6–20)
BUN: 23 mg/dL — ABNORMAL HIGH (ref 6–20)
CO2: 31 mmol/L (ref 22–32)
CO2: 32 mmol/L (ref 22–32)
CO2: 31 mmol/L (ref 22–32)
Calcium: 8.7 mg/dL — ABNORMAL LOW (ref 8.9–10.3)
Calcium: 9 mg/dL (ref 8.9–10.3)
Calcium: 8.9 mg/dL (ref 8.9–10.3)
Chloride: 98 mmol/L (ref 98–111)
Chloride: 96 mmol/L — ABNORMAL LOW (ref 98–111)
Chloride: 98 mmol/L (ref 98–111)
Creatinine, Ser: 0.71 mg/dL (ref 0.44–1.00)
Creatinine, Ser: 0.83 mg/dL (ref 0.44–1.00)
Creatinine, Ser: 0.79 mg/dL (ref 0.44–1.00)
GFR, Estimated: >60 mL/min (ref >60)
GFR, Estimated: >60 mL/min (ref >60)
GFR, Estimated: >60 mL/min (ref >60)
Glucose, Bld: 136 mg/dL — ABNORMAL HIGH (ref 70–99)
Glucose, Bld: 140 mg/dL — ABNORMAL HIGH (ref 70–99)
Glucose, Bld: 148 mg/dL — ABNORMAL HIGH (ref 70–99)
Potassium: 3.3 mmol/L — ABNORMAL LOW (ref 3.5–5.1)
Potassium: 3.3 mmol/L — ABNORMAL LOW (ref 3.5–5.1)
Potassium: 3.6 mmol/L (ref 3.5–5.1)
Sodium: 138 mmol/L (ref 135–145)
Sodium: 138 mmol/L (ref 135–145)
Sodium: 138 mmol/L (ref 135–145)

## 2023-02-03 LAB — GLUCOSE, CAPILLARY
Glucose-Capillary: 112 mg/dL — ABNORMAL HIGH (ref 70–99)
Glucose-Capillary: 114 mg/dL — ABNORMAL HIGH (ref 70–99)
Glucose-Capillary: 117 mg/dL — ABNORMAL HIGH (ref 70–99)
Glucose-Capillary: 117 mg/dL — ABNORMAL HIGH (ref 70–99)
Glucose-Capillary: 130 mg/dL — ABNORMAL HIGH (ref 70–99)
Glucose-Capillary: 134 mg/dL — ABNORMAL HIGH (ref 70–99)
Glucose-Capillary: 136 mg/dL — ABNORMAL HIGH (ref 70–99)
Glucose-Capillary: 140 mg/dL — ABNORMAL HIGH (ref 70–99)
Glucose-Capillary: 167 mg/dL — ABNORMAL HIGH (ref 70–99)
Glucose-Capillary: 175 mg/dL — ABNORMAL HIGH (ref 70–99)
Glucose-Capillary: 262 mg/dL — ABNORMAL HIGH (ref 70–99)
Glucose-Capillary: 309 mg/dL — ABNORMAL HIGH (ref 70–99)

## 2023-02-03 LAB — BETA-HYDROXYBUTYRIC ACID: Beta-Hydroxybutyric Acid: 0.22 mmol/L (ref 0.05–0.27)

## 2023-02-03 MED ORDER — SODIUM CHLORIDE 0.9% FLUSH
3.0000 mL | Freq: Two times a day (BID) | INTRAVENOUS | Status: DC
  Administered 2023-02-03 (×2): 3 mL via INTRAVENOUS

## 2023-02-03 MED ORDER — POTASSIUM CHLORIDE CRYS ER 20 MEQ PO TBCR
20.0000 meq | EXTENDED_RELEASE_TABLET | Freq: Once | ORAL | Status: AC
  Administered 2023-02-03: 20 meq via ORAL
  Filled 2023-02-03: qty 1

## 2023-02-03 MED ORDER — INSULIN ASPART 100 UNIT/ML IJ SOLN
0.0000 [IU] | INTRAMUSCULAR | Status: DC
  Administered 2023-02-03 (×2): 2 [IU] via SUBCUTANEOUS
  Administered 2023-02-03: 7 [IU] via SUBCUTANEOUS
  Administered 2023-02-04: 2 [IU] via SUBCUTANEOUS

## 2023-02-03 MED ORDER — SODIUM CHLORIDE 0.9 % IV SOLN: INTRAVENOUS | Status: DC

## 2023-02-03 MED ORDER — HYDROMORPHONE HCL 1 MG/ML IJ SOLN
1.0000 mg | INTRAMUSCULAR | Status: DC | PRN
  Administered 2023-02-03 – 2023-02-04 (×3): 1 mg via INTRAVENOUS
  Filled 2023-02-03 (×3): qty 1

## 2023-02-03 MED ORDER — SODIUM CHLORIDE 0.9 % IV SOLN: 250.0000 mL | INTRAVENOUS | Status: DC | PRN

## 2023-02-03 MED ORDER — METOCLOPRAMIDE HCL 5 MG/ML IJ SOLN
10.0000 mg | Freq: Three times a day (TID) | INTRAMUSCULAR | Status: DC
  Administered 2023-02-03 – 2023-02-04 (×3): 10 mg via INTRAVENOUS
  Filled 2023-02-03 (×3): qty 2

## 2023-02-03 MED ORDER — INSULIN ASPART 100 UNIT/ML IJ SOLN
0.0000 [IU] | Freq: Every day | INTRAMUSCULAR | Status: DC
  Administered 2023-02-04: 3 [IU] via SUBCUTANEOUS

## 2023-02-03 MED ORDER — SODIUM CHLORIDE 0.9% FLUSH: 3.0000 mL | INTRAVENOUS | Status: DC | PRN

## 2023-02-03 MED ORDER — INSULIN DETEMIR 100 UNIT/ML ~~LOC~~ SOLN
10.0000 [IU] | Freq: Every day | SUBCUTANEOUS | Status: DC
  Filled 2023-02-03: qty 0.1

## 2023-02-03 MED ORDER — INSULIN ASPART 100 UNIT/ML IJ SOLN: 0.0000 [IU] | Freq: Every day | INTRAMUSCULAR | Status: DC

## 2023-02-03 MED ORDER — PROCHLORPERAZINE EDISYLATE 10 MG/2ML IJ SOLN
5.0000 mg | Freq: Once | INTRAMUSCULAR | Status: AC
  Administered 2023-02-03: 5 mg via INTRAVENOUS
  Filled 2023-02-03: qty 2

## 2023-02-03 MED ORDER — INSULIN DETEMIR 100 UNIT/ML ~~LOC~~ SOLN
10.0000 [IU] | Freq: Two times a day (BID) | SUBCUTANEOUS | Status: DC
  Administered 2023-02-03 – 2023-02-04 (×3): 10 [IU] via SUBCUTANEOUS
  Filled 2023-02-03 (×5): qty 0.1

## 2023-02-03 MED ORDER — INSULIN ASPART 100 UNIT/ML IJ SOLN
0.0000 [IU] | Freq: Three times a day (TID) | INTRAMUSCULAR | Status: DC
  Administered 2023-02-03: 2 [IU] via SUBCUTANEOUS

## 2023-02-03 NOTE — Progress Notes (Signed)
This patient's CBG is 309, She gets 7 units for 309,  but also has HS coverage for 4 units for this CBG 309.  Do you want me to give only the 7 units and recheck?

## 2023-02-03 NOTE — Inpatient Diabetes Management (Signed)
Inpatient Diabetes Program Recommendations  AACE/ADA: New Consensus Statement on Inpatient Glycemic Control (2015)  Target Ranges:  Prepandial:   less than 140 mg/dL      Peak postprandial:   less than 180 mg/dL (1-2 hours)      Critically ill patients:  140 - 180 mg/dL   Lab Results  Component Value Date   GLUCAP 136 (H) 02/03/2023   HGBA1C 12.2 (H) 12/30/2022    Review of Glycemic Control  Latest Reference Range & Units 02/03/23 03:37 02/03/23 04:38 02/03/23 05:39 02/03/23 06:43 02/03/23 07:41  Glucose-Capillary 70 - 99 mg/dL 782 (H) 956 (H) 213 (H) 117 (H) 136 (H)   Diabetes history: DM 1 Outpatient Diabetes medications:  Dexcom CGM-G6 Novolog 0-18 units tid with meals Levemir 20 units daily Current orders for Inpatient glycemic control:  Novolog 0-15 units tid with meals and HS  Inpatient Diabetes Program Recommendations:    Note history of Type 1 DM.  Please add basal insulin this morning- Consider Levemir 10 units bid.  Also consider reducing Novolog correction to sensitive but increase frequency to q 4 hours.    Thanks,  Beryl Meager, RN, BC-ADM Inpatient Diabetes Coordinator Pager [phone]  (8a-5p)

## 2023-02-03 NOTE — Plan of Care (Signed)
  Problem: Education: Goal: Knowledge of General Education information will improve Description: Including pain rating scale, medication(s)/side effects and non-pharmacologic comfort measures Outcome: Progressing   Problem: Health Behavior/Discharge Planning: Goal: Ability to manage health-related needs will improve Outcome: Progressing   Problem: Clinical Measurements: Goal: Will remain free from infection Outcome: Progressing Goal: Diagnostic test results will improve Outcome: Progressing Goal: Respiratory complications will improve Outcome: Progressing Goal: Cardiovascular complication will be avoided Outcome: Progressing   Problem: Activity: Goal: Risk for activity intolerance will decrease Outcome: Progressing   Problem: Elimination: Goal: Will not experience complications related to bowel motility Outcome: Progressing Goal: Will not experience complications related to urinary retention Outcome: Progressing   Problem: Pain Managment: Goal: General experience of comfort will improve Outcome: Progressing

## 2023-02-03 NOTE — Progress Notes (Signed)
   02/03/23 1526  Assess: MEWS Score  Temp 99.1 F (37.3 C)  BP (!) 159/95  MAP (mmHg) 112  Pulse Rate (!) 121  Resp 18  SpO2 99 %  Assess: MEWS Score  MEWS Temp 0  MEWS Systolic 0  MEWS Pulse 2  MEWS RR 0  MEWS LOC 0  MEWS Score 2  MEWS Score Color Yellow  Assess: if the MEWS score is Yellow or Red  Were vital signs taken at a resting state? Yes  Focused Assessment No change from prior assessment  Does the patient meet 2 or more of the SIRS criteria? No  MEWS guidelines implemented  Yes, yellow  Treat  MEWS Interventions Considered administering scheduled or prn medications/treatments as ordered  Take Vital Signs  Increase Vital Sign Frequency  Yellow: Q2hr x1, continue Q4hrs until patient remains green for 12hrs  Escalate  MEWS: Escalate Yellow: Discuss with charge nurse and consider notifying provider and/or RRT  Notify: Charge Nurse/RN  Name of Charge Nurse/RN Notified Ova Freshwater  Assess: SIRS CRITERIA  SIRS Temperature  0  SIRS Pulse 1  SIRS Respirations  0  SIRS WBC 0  SIRS Score Sum  1

## 2023-02-03 NOTE — Progress Notes (Signed)
Pt has CBG of 309 now, She gets 7 units CBG of 309 and at bedtime CBG of 309 gets 4 units.  I'll give the 7 units for now let me know if you want to give the 4 units for HS coverage.   CBG is every 4 hours.

## 2023-02-03 NOTE — Progress Notes (Signed)
PROGRESS NOTE  [name]  DOB: October 07, 1985  PCP: Dulce Sellar, NP ZOX:[id number]  DOA: 02/02/2023  LOS: 1 day  Hospital Day: 2  Brief narrative: [name] is a 37 y.o. female with PMH significant for DM1, diabetic polyneuropathy, gastroparesis, cyclic vomiting syndrome, chronic pain with opiate dependence, marijuana smoking. 6/15, presented to ED with complaint of nausea vomiting and abdominal pain.  Patient was manage in the ED with IV hydration, IV antiemetic.  Patient left AMA stating that she is only guarding her daughter and could not stay longer.  At home, she continued to have symptoms.  Became dizzy and weak.  Unable to measure glucose to as far glucometer was broken.  In the ED, she was tachycardic to 130s, blood pressure in 100s, somnolent and dry looking Labs with WBC count 16.3, sodium 130, glucose 699, BUN/creatinine 32/1.39, serum bicarb 28, anion gap 18, beta-hydroxybutyrate acid elevated to 1.64, potassium 3.8. Venous blood gas with pH normal at 7.42, pCO2 50, bicarb 32 She was given IV fluid, started on IV insulin infusion Admitted to [facility].  Subjective: Patient was seen and examined this am.  Lying down in bed.  Continues to complain of abdominal pain.  Tachycardic to 120s. Says she is nauseous.  Asking for Dilaudid IV. Heart rate overnight remains in 110s, blood pressure improved to 150s, required 2 L oxygen nasal canula at night. Last set of blood work from this morning showed creatinine improved, potassium 3.3, and ketone level normalized, anion gap closed.  Assessment and plan: Intractable nausea, vomiting H/o gastroparesis, cyclic vomiting syndrome Chronic marijuana smoking Patient presented with intractable nausea and vomiting for several days  She was dehydrated, tachycardic on admission. Started on IV hydration, IV antiemetic. Continues to remain tachycardic and nauseous. Continue IV fluid. Clear liquid diet  for now. Monitor improvement in oral intake, hydration. Add scheduled Reglan for next 24 hours.  IV Dilaudid for pain control.  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 Initially treated with insulin drip. Insulin drip was stopped this morning.  Start Levemir 10 units twice daily with sliding scale insulin. Diabetic coordinator consult appreciated. Recent Labs  Lab 02/03/23 0438 02/03/23 0539 02/03/23 0643 02/03/23 0741 02/03/23 1157  GLUCAP 140* 130* 117* 136* 175*   Sinus tachycardia Heart rate persistently elevated over 110.  Likely from dehydration.  No primary cardiac issues.  Continue to monitor in telemetry.  AKI  Baseline creatinine normal.  Presented with creatinine 1.31.  Improved with IV fluid  Recent Labs    12/29/22 2047 12/30/22 0051 12/30/22 0449 12/31/22 1132 02/01/23 1626 02/02/23 1652 02/02/23 2125 02/03/23 0245 02/03/23 0523 02/03/23 0951  BUN 14 13 11 11 13  32* 31* 27* 25* 23*  CREATININE 0.87 0.73 0.74 0.79 0.86 1.31* 0.98 0.71 0.83 0.79    Chronic pain with opiate dependence PTA meds-oxycodone PRN  Anxiety/depression Topamax, Cymbalta, Elavil, Remeron    Mobility: Encourage ambulation  Goals of care   Code Status: Full Code     DVT prophylaxis:  enoxaparin (LOVENOX) injection 40 mg Start: 02/03/23 1000   Antimicrobials: None Fluid: NS at 125 mill per hour Consultants: None Family Communication: None at bedside  Status: Inpatient Level of care:  Telemetry   Patient from: Home Anticipated d/c to: Pending clinical course Needs  to continue in-hospital care:  Needs IV hydration, symptoms monitoring, insulin adjustment       Diet:  Diet Order             Diet Carb Modified Fluid consistency:  Thin; Room service appropriate? Yes  Diet effective now                   Scheduled Meds:  amitriptyline  25 mg Oral QHS   Chlorhexidine Gluconate Cloth  6 each Topical Daily   DULoxetine  60 mg Oral Daily   enoxaparin (LOVENOX) injection  40 mg Subcutaneous Q24H   insulin aspart  0-5 Units Subcutaneous QHS   insulin aspart  0-9 Units Subcutaneous Q4H   insulin detemir  10 Units Subcutaneous BID   metoCLOPramide (REGLAN) injection  10 mg Intravenous Q8H   rosuvastatin  10 mg Oral Daily   sodium chloride flush  3 mL Intravenous Q12H    PRN meds: sodium chloride, dextrose, HYDROmorphone (DILAUDID) injection, oxyCODONE, promethazine (PHENERGAN) injection (IM or IVPB), sodium chloride flush, traZODone   Infusions:   sodium chloride     sodium chloride 125 mL/hr at 02/03/23 1109   promethazine (PHENERGAN) injection (IM or IVPB) Stopped (02/03/23 1124)    Antimicrobials: Anti-infectives (From admission, onward)    None       Nutritional status:  Body mass index is 30.67 kg/m.          Objective: Vitals:   02/03/23 1000 02/03/23 1100  BP: (!) 185/129 (!) 177/120  Pulse: (!) 116 (!) 125  Resp: 10 (!) 27  Temp:    SpO2: 100% 100%    Intake/Output Summary (Last 24 hours) at 02/03/2023 1224 Last data filed at 02/03/2023 0900 Gross per 24 hour  Intake 3472.96 ml  Output --  Net 3472.96 ml   Filed Weights   02/02/23 1633  Weight: 86.2 kg   Weight change:  Body mass index is 30.67 kg/m.   Physical Exam: General exam: Young African-American female.  In distress from nausea and abdominal pain  Skin: No rashes, lesions or ulcers. HEENT: Atraumatic, normocephalic, no obvious bleeding Lungs: Clear to auscultation bilaterally CVS: Tachycardic, regular rhythm, no murmur GI/Abd soft, epigastric tenderness present, mild diffuse tenderness present, bowel sound present CNS: Alert, awake, oriented x 3 Psychiatry: Sad affect because of pain Extremities: No pedal  edema, no calf tenderness  Data Review: I have personally reviewed the laboratory data and studies available.  F/u labs  Unresulted Labs (From admission, onward)     Start     Ordered   02/09/23 0500  Creatinine, serum  (enoxaparin (LOVENOX)    CrCl >/= 30 ml/min)  Weekly,   R     Comments: while on enoxaparin therapy    02/02/23 2249   02/04/23 0500  CBC with Differential/Platelet  Daily,   R     Question:  Specimen collection method  Answer:  Lab=Lab collect   02/03/23 1039   02/04/23 0500  Magnesium  Tomorrow morning,   R       Question:  Specimen collection method  Answer:  Lab=Lab collect   02/03/23 1039   02/04/23 0500  Phosphorus  Tomorrow morning,   R       Question:  Specimen collection method  Answer:  Lab=Lab collect   02/03/23 1039   02/03/23 0500  Basic metabolic panel  Daily,   R     Question:  Specimen collection method  Answer:  Lab=Lab collect   02/03/23  0403   02/02/23 1644  Urinalysis, Routine w reflex microscopic -Urine, Clean Catch  Once,   R       Question:  Specimen Source  Answer:  Urine, Clean Catch   02/02/23 1644   02/02/23 1644  Pregnancy, urine  Once,   R       Comments: If UA unable to be obtained, obtain hcg serum qualitative lab test    02/02/23 1644            Total time spent in review of labs and imaging, patient evaluation, formulation of plan, documentation and communication with family: 55 minutes  Signed, Lorin Glass, MD [facility] 02/03/2023

## 2023-02-04 DIAGNOSIS — E1165 Type 2 diabetes mellitus with hyperglycemia: Secondary | ICD-10-CM | POA: Diagnosis not present

## 2023-02-04 LAB — GLUCOSE, CAPILLARY
Glucose-Capillary: 151 mg/dL — ABNORMAL HIGH (ref 70–99)
Glucose-Capillary: 146 mg/dL — ABNORMAL HIGH (ref 70–99)
Glucose-Capillary: 121 mg/dL — ABNORMAL HIGH (ref 70–99)

## 2023-02-04 LAB — BASIC METABOLIC PANEL
Anion gap: 8 (ref 5–15)
BUN: 15 mg/dL (ref 6–20)
CO2: 30 mmol/L (ref 22–32)
Calcium: 8.5 mg/dL — ABNORMAL LOW (ref 8.9–10.3)
Chloride: 99 mmol/L (ref 98–111)
Creatinine, Ser: 0.74 mg/dL (ref 0.44–1.00)
GFR, Estimated: >60 mL/min (ref >60)
Glucose, Bld: 162 mg/dL — ABNORMAL HIGH (ref 70–99)
Potassium: 3.1 mmol/L — ABNORMAL LOW (ref 3.5–5.1)
Sodium: 137 mmol/L (ref 135–145)

## 2023-02-04 LAB — CBC WITH DIFFERENTIAL/PLATELET
Abs Immature Granulocytes: 0.03 10*3/uL (ref 0.00–0.07)
Basophils Absolute: 0 10*3/uL (ref 0.0–0.1)
Basophils Relative: 0 %
Eosinophils Absolute: 0.1 10*3/uL (ref 0.0–0.5)
Eosinophils Relative: 1 %
HCT: 39.8 % (ref 36.0–46.0)
Hemoglobin: 12.7 g/dL (ref 12.0–15.0)
Immature Granulocytes: 0 %
Lymphocytes Relative: 34 %
Lymphs Abs: 3.6 10*3/uL (ref 0.7–4.0)
MCH: 29.7 pg (ref 26.0–34.0)
MCHC: 31.9 g/dL (ref 30.0–36.0)
MCV: 93 fL (ref 80.0–100.0)
Monocytes Absolute: 0.9 10*3/uL (ref 0.1–1.0)
Monocytes Relative: 9 %
Neutro Abs: 5.9 10*3/uL (ref 1.7–7.7)
Neutrophils Relative %: 56 %
Platelets: 366 10*3/uL (ref 150–400)
RBC: 4.28 MIL/uL (ref 3.87–5.11)
RDW: 14.2 % (ref 11.5–15.5)
WBC: 10.5 10*3/uL (ref 4.0–10.5)
nRBC: 0 % (ref 0.0–0.2)

## 2023-02-04 LAB — PHOSPHORUS: Phosphorus: 2.3 mg/dL — ABNORMAL LOW (ref 2.5–4.6)

## 2023-02-04 LAB — MAGNESIUM: Magnesium: 2.4 mg/dL (ref 1.7–2.4)

## 2023-02-04 MED ORDER — OXYCODONE HCL 10 MG PO TABS: 10.0000 mg | ORAL_TABLET | Freq: Two times a day (BID) | ORAL | 0 refills | Status: AC | PRN

## 2023-02-04 MED ORDER — INSULIN ASPART 100 UNIT/ML IJ SOLN: 0.0000 [IU] | Freq: Three times a day (TID) | INTRAMUSCULAR | Status: DC

## 2023-02-04 MED ORDER — INSULIN ASPART 100 UNIT/ML FLEXPEN
0.0000 [IU] | PEN_INJECTOR | Freq: Three times a day (TID) | SUBCUTANEOUS | 2 refills | Status: DC
Start: 2023-02-04 — End: 2023-08-07

## 2023-02-04 MED ORDER — LEVEMIR FLEXTOUCH 100 UNIT/ML ~~LOC~~ SOPN
15.0000 [IU] | PEN_INJECTOR | Freq: Two times a day (BID) | SUBCUTANEOUS | Status: DC
Start: 2023-02-04 — End: 2023-08-07

## 2023-02-04 MED ORDER — K PHOS MONO-SOD PHOS DI & MONO 155-852-130 MG PO TABS
500.0000 mg | ORAL_TABLET | Freq: Two times a day (BID) | ORAL | Status: DC
  Administered 2023-02-04: 500 mg via ORAL
  Filled 2023-02-04: qty 2

## 2023-02-04 MED ORDER — POTASSIUM CHLORIDE CRYS ER 20 MEQ PO TBCR: 40.0000 meq | EXTENDED_RELEASE_TABLET | Freq: Once | ORAL | Status: DC

## 2023-02-04 MED ORDER — INSULIN ASPART 100 UNIT/ML IJ SOLN
0.0000 [IU] | Freq: Three times a day (TID) | INTRAMUSCULAR | Status: DC
  Administered 2023-02-04 (×2): 1 [IU] via SUBCUTANEOUS

## 2023-02-04 MED ORDER — METOCLOPRAMIDE HCL 10 MG PO TABS: 10.0000 mg | ORAL_TABLET | Freq: Three times a day (TID) | ORAL | 0 refills | Status: DC

## 2023-02-04 NOTE — TOC CM/SW Note (Addendum)
Transition of Care [facility]) - Inpatient Brief Assessment   Patient Details  Name: [name] MRN: [id number] Date of Birth: [birth year]  Transition of Care [facility]) CM/SW Contact:    Erin Sons, LCSW Phone Number: 02/04/2023, 11:50 AM   Clinical Narrative:  Screening completed; no TOC needs identified at this time  Transition of Care Asessment: Insurance and Status: Insurance coverage has been reviewed Patient has primary care physician: Yes Home environment has been reviewed: Yes Prior level of function:: Independent Prior/Current Home Services: No current home services Social Determinants of Health Reivew: SDOH reviewed no interventions necessary Readmission risk has been reviewed: Yes Transition of care needs: no transition of care needs at this time   Readmission Risk Interventions    02/04/2023   11:55 AM  Readmission Risk Prevention Plan  Transportation Screening Complete  Medication Review (RN Care Manager) Complete  PCP or Specialist appointment within 3-5 days of discharge Complete  HRI or Home Care Consult Complete

## 2023-02-04 NOTE — Progress Notes (Signed)
RE: CBG was 309 at 2130. It was just rechecked at [place] and it is 249. I will give the coverage dose of 2 units for HS CBG of 249. Is that ok?

## 2023-02-04 NOTE — Discharge Summary (Signed)
Physician Discharge Summary  [name] ZOX:[id number] DOB: 04-13-1986 DOA: 02/02/2023  PCP: Dulce Sellar, NP  Admit date: 02/02/2023 Discharge date: 02/04/2023  Admitted From: Home Discharge disposition: Home  Recommendations at discharge:  Stay on liquid diet at home for 2 days and advance to soft Reglan as needed to continue Minimize use of pain meds Ensure compliance with insulin.  Continue monitor blood sugar level at home.   Continue to follow-up with your endocrinologist as an outpatient.   Brief narrative: [name] is a 37 y.o. female with PMH significant for DM1, diabetic polyneuropathy, gastroparesis, cyclic vomiting syndrome, chronic pain with opiate dependence, marijuana smoking. 6/15, presented to ED with complaint of nausea vomiting and abdominal pain.  Patient was manage in the ED with IV hydration, IV antiemetic.  Patient left AMA stating that she is only guarding her daughter and could not stay longer.  At home, she continued to have symptoms.  Became dizzy and weak.  Unable to measure glucose to as far glucometer was broken.  In the ED, she was tachycardic to 130s, blood pressure in 100s, somnolent and dry looking Labs with WBC count 16.3, sodium 130, glucose 699, BUN/creatinine 32/1.39, serum bicarb 28, anion gap 18, beta-hydroxybutyrate acid elevated to 1.64, potassium 3.8. Venous blood gas with pH normal at 7.42, pCO2 50, bicarb 32 She was given IV fluid, started on IV insulin infusion Admitted to [facility].  Subjective: Patient was seen and examined this am.  Walking back from bathroom.  Feels better.  Nausea improving.  Pain improved.  Able to tolerate liquid diet. Wants to go home.  Hospital course: Intractable nausea, vomiting H/o gastroparesis, cyclic vomiting syndrome Chronic marijuana smoking Patient presented with intractable nausea and vomiting for several days  She was dehydrated, tachycardic on  admission. She was started on IV hydration, IV antiemetic. In the last 24 hours, symptoms are remarkably improved.  Able to tolerate clear liquid diet with Reglan scheduled. Pain improved.  Nausea improved.  Wants to go home today.  To continue Reglan as needed at home.  Can continue as needed oxycodone but recommended to minimize use of pain medicines  Type 2 diabetes mellitus uncontrolled with hyperglycemia and ketosis A1c 12.2 on 12/30/2022 PTA meds-Levemir 20 units daily, sliding scale insulin Premeal Presented with blood sugar elevated over 699 and elevated beta-hydroxybutyrate acid level without acidosis and blood gas. Initially treated with insulin drip.  Subsequently switched to subcu insulin. Given her inadequate control of A1c, I have advised her to increase total daily dose of Levemir to 15 units twice daily.  Continue sliding scale insulin/Accu-Cheks Diabetic coordinator consult appreciated. Recent Labs  Lab 02/03/23 1640 02/03/23 2134 02/03/23 2355 02/04/23 0513 02/04/23 0715  GLUCAP 167* 309* 262* 151* 146*   Sinus tachycardia Due to dehydration.  Improved.  AKI  Baseline creatinine normal.  Presented with creatinine 1.31.  Improved with IV fluid  Recent Labs    12/30/22 0051 12/30/22 0449 12/31/22 1132 02/01/23 1626 02/02/23 1652 02/02/23 2125 02/03/23 0245 02/03/23 0523 02/03/23 0951 02/04/23 0500  BUN 13 11 11 13  32* 31* 27* 25* 23* 15  CREATININE 0.73 0.74 0.79 0.86 1.31* 0.98 0.71 0.83 0.79 0.74    Chronic pain with opiate dependence PTA meds-oxycodone PRN  Anxiety/depression Topamax, Cymbalta, Elavil, Remeron   Goals of care   Code Status: Full Code   Wounds:  -    Discharge Exam:   Vitals:   02/03/23 2004 02/03/23 2356 02/04/23  0433 02/04/23 0834  BP: (!) 141/91 104/67 126/81 117/84  Pulse: (!) 111 (!) 108 98 (!) 111  Resp:  20 20 20   Temp:  98.2 F (36.8 C) 97.8 F (36.6 C) 98.5 F (36.9 C)  TempSrc:  Oral Oral Oral  SpO2:  99%  99% 99%  Weight:      Height:        Body mass index is 30.67 kg/m.   General exam: Young African-American female.  In distress from nausea and abdominal pain  Skin: No rashes, lesions or ulcers. HEENT: Atraumatic, normocephalic, no obvious bleeding Lungs: Clear to auscultation bilaterally CVS: Regular rate, regular rhythm, no murmur GI/Abd soft, no tenderness, mild diffuse tenderness present, bowel sound present CNS: Alert, awake, oriented x 3 Psychiatry: Sad affect because of pain Extremities: No pedal edema, no calf tenderness  Follow ups:    Follow-up Information     Dulce Sellar, NP Follow up.   Specialty: Family Medicine Contact information: [address] [place] [address] [phone]                 Discharge Instructions:   Discharge Instructions     Call MD for:  difficulty breathing, headache or visual disturbances   Complete by: As directed    Call MD for:  extreme fatigue   Complete by: As directed    Call MD for:  hives   Complete by: As directed    Call MD for:  persistant dizziness or light-headedness   Complete by: As directed    Call MD for:  persistant nausea and vomiting   Complete by: As directed    Call MD for:  severe uncontrolled pain   Complete by: As directed    Call MD for:  temperature >100.4   Complete by: As directed    Diet Carb Modified   Complete by: As directed    Discharge instructions   Complete by: As directed    Recommendations at discharge:   Stay on liquid diet at home for 2 days and advance to soft  Reglan as needed to continue  Minimize use of pain meds  Ensure compliance with insulin.  Continue monitor blood sugar level at home.    Continue to follow-up with your endocrinologist as an outpatient.  Discharge instructions for diabetes mellitus: Check blood sugar 3 times a day and bedtime at home. If blood sugar running above 200 or less than 70 please call your MD to adjust insulin. If  you notice signs and symptoms of hypoglycemia (low blood sugar) like jitteriness, confusion, thirst, tremor and sweating, please check blood sugar, drink sugary drink/biscuits/sweets to increase sugar level and call MD or return to ER.    General discharge instructions: Follow with Primary MD Dulce Sellar, NP in 7 days  Please request your PCP  to go over your hospital tests, procedures, radiology results at the follow up. Please get your medicines reviewed and adjusted.  Your PCP may decide to repeat certain labs or tests as needed. Do not drive, operate heavy machinery, perform activities at heights, swimming or participation in water activities or provide baby sitting services if your were admitted for syncope or siezures until you have seen by Primary MD or a Neurologist and advised to do so again. [place] Controlled Substance Reporting System database was reviewed. Do not drive, operate heavy machinery, perform activities at heights, swim, participate in water activities or provide baby-sitting services while on medications for pain, sleep and mood  until your outpatient physician has reevaluated you and advised to do so again.  You are strongly recommended to comply with the dose, frequency and duration of prescribed medications. Activity: As tolerated with Full fall precautions use walker/cane & assistance as needed Avoid using any recreational substances like cigarette, tobacco, alcohol, or non-prescribed drug. If you experience worsening of your admission symptoms, develop shortness of breath, life threatening emergency, suicidal or homicidal thoughts you must seek medical attention immediately by calling 911 or calling your MD immediately  if symptoms less severe. You must read complete instructions/literature along with all the possible adverse reactions/side effects for all the medicines you take and that have been prescribed to you. Take any new medicine only after you have  completely understood and accepted all the possible adverse reactions/side effects.  Wear Seat belts while driving. You were cared for by a hospitalist during your hospital stay. If you have any questions about your discharge medications or the care you received while you were in the hospital after you are discharged, you can call the unit and ask to speak with the hospitalist or the covering physician. Once you are discharged, your primary care physician will handle any further medical issues. Please note that NO REFILLS for any discharge medications will be authorized once you are discharged, as it is imperative that you return to your primary care physician (or establish a relationship with a primary care physician if you do not have one).   Increase activity slowly   Complete by: As directed        Discharge Medications:   Allergies as of 02/04/2023       Reactions   Pramipexole Shortness Of Breath        Medication List     STOP taking these medications    gabapentin 400 MG capsule Commonly known as: NEURONTIN   hydrOXYzine 25 MG tablet Commonly known as: ATARAX   promethazine 25 MG tablet Commonly known as: PHENERGAN   rosuvastatin 10 MG tablet Commonly known as: CRESTOR   traZODone 150 MG tablet Commonly known as: DESYREL       TAKE these medications    amitriptyline 25 MG tablet Commonly known as: ELAVIL Take 75 mg by mouth at bedtime.   B-D UF III MINI PEN NEEDLES 31G X 5 MM Misc Generic drug: Insulin Pen Needle To use with Insulin pens, as directed   cyanocobalamin 1000 MCG/ML injection Commonly known as: VITAMIN B12 Inject 1,000 mcg into the muscle every 30 (thirty) days.   Dexcom G6 Transmitter Misc Inject 1 each into the skin every 14 (fourteen) days.   DULoxetine 60 MG capsule Commonly known as: CYMBALTA Take 60 mg by mouth daily.   insulin aspart 100 UNIT/ML FlexPen Commonly known as: NOVOLOG Inject 0-18 Units into the skin 3 (three)  times daily with meals. Check Blood Glucose (BG) and inject per scale: BG <150= 0 unit; BG 150-200= 3 unit; BG 201-250= 6 unit; BG 251-300= 9 unit; BG 301-350= 12 unit; BG 351-400= 15 unit; BG >400= 18 unit and Call Primary Care. What changed: additional instructions   Insulin Syringes (Disposable) U-100 1 ML Misc Inject 1 each into the skin in the morning and at bedtime.   Levemir FlexTouch 100 UNIT/ML FlexPen Generic drug: insulin detemir Inject 15 Units into the skin 2 (two) times daily. What changed:  how much to take how to take this when to take this additional instructions   metoCLOPramide 10 MG tablet Commonly known as: REGLAN  Take 1 tablet (10 mg total) by mouth every 6 (six) hours as needed for nausea or vomiting. What changed: Another medication with the same name was added. Make sure you understand how and when to take each.   metoCLOPramide 10 MG tablet Commonly known as: REGLAN Take 1 tablet (10 mg total) by mouth 3 (three) times daily with meals for 14 days. What changed: You were already taking a medication with the same name, and this prescription was added. Make sure you understand how and when to take each.   mirtazapine 15 MG tablet Commonly known as: REMERON Take 15-30 mg by mouth at bedtime.   Oxycodone HCl 10 MG Tabs Take 1 tablet (10 mg total) by mouth 2 (two) times daily as needed for up to 5 days (For pain). What changed: when to take this         The results of significant diagnostics from this hospitalization (including imaging, microbiology, ancillary and laboratory) are listed below for reference.    Procedures and Diagnostic Studies:   DG Chest Portable 1 View  Result Date: 02/02/2023 CLINICAL DATA:  Hypoxia, dizziness EXAM: PORTABLE CHEST 1 VIEW COMPARISON:  06/02/2022 FINDINGS: Cardiac size is within normal limits. Lung fields are clear of any infiltrates or pulmonary edema. There is no pleural effusion or pneumothorax. IMPRESSION: No  active cardiopulmonary disease. Electronically Signed   By: Ernie Avena M.D.   On: 02/02/2023 18:51     Labs:   Basic Metabolic Panel: Recent Labs  Lab 02/02/23 2125 02/03/23 0245 02/03/23 0523 02/03/23 0951 02/04/23 0500  NA 138 138 138 138 137  K 3.7 3.3* 3.3* 3.6 3.1*  CL 96* 98 96* 98 99  CO2 31 31 32 31 30  GLUCOSE 313* 136* 140* 148* 162*  BUN 31* 27* 25* 23* 15  CREATININE 0.98 0.71 0.83 0.79 0.74  CALCIUM 9.3 8.7* 9.0 8.9 8.5*  MG  --   --   --   --  2.4  PHOS  --   --   --   --  2.3*   GFR Estimated Creatinine Clearance: 107.6 mL/min (by C-G formula based on SCr of 0.74 mg/dL). Liver Function Tests: Recent Labs  Lab 02/01/23 1626 02/02/23 1655  AST 15 12*  ALT 28 23  ALKPHOS 106 109  BILITOT 0.6 0.7  PROT 8.3* 8.4*  ALBUMIN 4.7 4.9   Recent Labs  Lab 02/01/23 1626 02/02/23 1655  LIPASE 13 <10*   No results for input(s): "AMMONIA" in the last 168 hours. Coagulation profile No results for input(s): "INR", "PROTIME" in the last 168 hours.  CBC: Recent Labs  Lab 02/01/23 1626 02/02/23 1652 02/02/23 1811 02/04/23 0500  WBC 10.8* 16.3*  --  10.5  NEUTROABS  --   --   --  5.9  HGB 13.4 13.4 13.9 12.7  HCT 38.4 38.0 41.0 39.8  MCV 87.3 85.2  --  93.0  PLT 344 438*  --  366   Cardiac Enzymes: No results for input(s): "CKTOTAL", "CKMB", "CKMBINDEX", "TROPONINI" in the last 168 hours. BNP: Invalid input(s): "POCBNP" CBG: Recent Labs  Lab 02/03/23 1640 02/03/23 2134 02/03/23 2355 02/04/23 0513 02/04/23 0715  GLUCAP 167* 309* 262* 151* 146*   D-Dimer No results for input(s): "DDIMER" in the last 72 hours. Hgb A1c No results for input(s): "HGBA1C" in the last 72 hours. Lipid Profile No results for input(s): "CHOL", "HDL", "LDLCALC", "TRIG", "CHOLHDL", "LDLDIRECT" in the last 72 hours. Thyroid function studies No results for input(s): "  TSH", "T4TOTAL", "T3FREE", "THYROIDAB" in the last 72 hours.  Invalid input(s):  "FREET3" Anemia work up No results for input(s): "VITAMINB12", "FOLATE", "FERRITIN", "TIBC", "IRON", "RETICCTPCT" in the last 72 hours. Microbiology Recent Results (from the past 240 hour(s))  MRSA Next Gen by PCR, Nasal     Status: None   Collection Time: 02/02/23 10:19 PM   Specimen: Nasal Mucosa; Nasal Swab  Result Value Ref Range Status   MRSA by PCR Next Gen NOT DETECTED NOT DETECTED Final    Comment: (NOTE) The GeneXpert MRSA Assay (FDA approved for NASAL specimens only), is one component of a comprehensive MRSA colonization surveillance program. It is not intended to diagnose MRSA infection nor to guide or monitor treatment for MRSA infections. Test performance is not FDA approved in patients less than 38 years old. Performed at [facility], [address] [place], [place] [address]     Time coordinating discharge: 45 minutes  Signed: Melina Schools Shawnise Peterkin  [facility] 02/04/2023, 11:47 AM

## 2023-02-05 ENCOUNTER — Telehealth: Payer: Self-pay

## 2023-02-05 NOTE — Transitions of Care (Post Inpatient/ED Visit) (Signed)
   02/05/2023  Name: [name] MRN: [id number] DOB: 10/14/1985  Today's TOC FU Call Status: Today's TOC FU Call Status:: Unsuccessul Call (1st Attempt) Unsuccessful Call (1st Attempt) Date: 02/05/23  Attempted to reach the patient regarding the most recent Inpatient/ED visit.  Follow Up Plan: Additional outreach attempts will be made to reach the patient to complete the Transitions of Care (Post Inpatient/ED visit) call.   Signature TB,CMA

## 2023-02-06 ENCOUNTER — Telehealth: Payer: Self-pay

## 2023-02-06 NOTE — Transitions of Care (Post Inpatient/ED Visit) (Signed)
   02/06/2023  Name: [name] MRN: [id number] DOB: 1986-06-06  Today's TOC FU Call Status: Today's TOC FU Call Status:: Unsuccessful Call (2nd Attempt) Unsuccessful Call (2nd Attempt) Date: 02/06/23  Attempted to reach the patient regarding the most recent Inpatient/ED visit.  Follow Up Plan: Additional outreach attempts will be made to reach the patient to complete the Transitions of Care (Post Inpatient/ED visit) call.   Signature  TB,CMA

## 2023-02-07 ENCOUNTER — Emergency Department (HOSPITAL_BASED_OUTPATIENT_CLINIC_OR_DEPARTMENT_OTHER)
Admission: EM | Admit: 2023-02-07 | Discharge: 2023-02-07 | Disposition: A | Discharge status: Home / Self Care | Payer: BC Managed Care – PPO | Attending: Emergency Medicine | Admitting: Emergency Medicine

## 2023-02-07 ENCOUNTER — Other Ambulatory Visit: Payer: Self-pay

## 2023-02-07 ENCOUNTER — Encounter (HOSPITAL_BASED_OUTPATIENT_CLINIC_OR_DEPARTMENT_OTHER): Payer: Self-pay | Admitting: Emergency Medicine

## 2023-02-07 DIAGNOSIS — R739 Hyperglycemia, unspecified: Secondary | ICD-10-CM

## 2023-02-07 DIAGNOSIS — K3184 Gastroparesis: Secondary | ICD-10-CM

## 2023-02-07 DIAGNOSIS — R55 Syncope and collapse: Secondary | ICD-10-CM | POA: Insufficient documentation

## 2023-02-07 LAB — CBG MONITORING, ED: Glucose-Capillary: 322 mg/dL — ABNORMAL HIGH (ref 70–99)

## 2023-02-07 LAB — BASIC METABOLIC PANEL
Anion gap: 14 (ref 5–15)
BUN: 8 mg/dL (ref 6–20)
CO2: 25 mmol/L (ref 22–32)
Calcium: 10.4 mg/dL — ABNORMAL HIGH (ref 8.9–10.3)
Chloride: 95 mmol/L — ABNORMAL LOW (ref 98–111)
Creatinine, Ser: 0.71 mg/dL (ref 0.44–1.00)
GFR, Estimated: >60 mL/min (ref >60)
Glucose, Bld: 348 mg/dL — ABNORMAL HIGH (ref 70–99)
Potassium: 5.3 mmol/L — ABNORMAL HIGH (ref 3.5–5.1)
Sodium: 134 mmol/L — ABNORMAL LOW (ref 135–145)

## 2023-02-07 LAB — CBC WITH DIFFERENTIAL/PLATELET
Abs Immature Granulocytes: 0.06 10*3/uL (ref 0.00–0.07)
Basophils Absolute: 0.1 10*3/uL (ref 0.0–0.1)
Basophils Relative: 0 %
Eosinophils Absolute: 0.1 10*3/uL (ref 0.0–0.5)
Eosinophils Relative: 1 %
HCT: 38.8 % (ref 36.0–46.0)
Hemoglobin: 13.6 g/dL (ref 12.0–15.0)
Immature Granulocytes: 1 %
Lymphocytes Relative: 28 %
Lymphs Abs: 3.5 10*3/uL (ref 0.7–4.0)
MCH: 30.6 pg (ref 26.0–34.0)
MCHC: 35.1 g/dL (ref 30.0–36.0)
MCV: 87.2 fL (ref 80.0–100.0)
Monocytes Absolute: 0.7 10*3/uL (ref 0.1–1.0)
Monocytes Relative: 6 %
Neutro Abs: 7.9 10*3/uL — ABNORMAL HIGH (ref 1.7–7.7)
Neutrophils Relative %: 64 %
Platelets: 357 10*3/uL (ref 150–400)
RBC: 4.45 MIL/uL (ref 3.87–5.11)
RDW: 13.5 % (ref 11.5–15.5)
WBC: 12.4 10*3/uL — ABNORMAL HIGH (ref 4.0–10.5)
nRBC: 0 % (ref 0.0–0.2)

## 2023-02-07 LAB — I-STAT VENOUS BLOOD GAS, ED
Acid-Base Excess: 8 mmol/L — ABNORMAL HIGH (ref 0.0–2.0)
Bicarbonate: 29.8 mmol/L — ABNORMAL HIGH (ref 20.0–28.0)
Calcium, Ion: 1.1 mmol/L — ABNORMAL LOW (ref 1.15–1.40)
HCT: 42 % (ref 36.0–46.0)
Hemoglobin: 14.3 g/dL (ref 12.0–15.0)
O2 Saturation: 86 %
Potassium: 4.5 mmol/L (ref 3.5–5.1)
Sodium: 134 mmol/L — ABNORMAL LOW (ref 135–145)
TCO2: 31 mmol/L (ref 22–32)
pCO2, Ven: 32.8 mmHg — ABNORMAL LOW (ref 44–60)
pH, Ven: 7.566 — ABNORMAL HIGH (ref 7.25–7.43)
pO2, Ven: 43 mmHg (ref 32–45)

## 2023-02-07 LAB — BETA-HYDROXYBUTYRIC ACID: Beta-Hydroxybutyric Acid: 2.02 mmol/L — ABNORMAL HIGH (ref 0.05–0.27)

## 2023-02-07 LAB — HCG, SERUM, QUALITATIVE: Preg, Serum: NEGATIVE

## 2023-02-07 MED ORDER — PROMETHAZINE HCL 25 MG/ML IJ SOLN
INTRAMUSCULAR | Status: AC
  Filled 2023-02-07: qty 1

## 2023-02-07 MED ORDER — INSULIN REGULAR(HUMAN) IN NACL 100-0.9 UT/100ML-% IV SOLN: INTRAVENOUS | Status: DC

## 2023-02-07 MED ORDER — DEXTROSE IN LACTATED RINGERS 5 % IV SOLN: INTRAVENOUS | Status: DC

## 2023-02-07 MED ORDER — HYDROMORPHONE HCL 1 MG/ML IJ SOLN
1.0000 mg | Freq: Once | INTRAMUSCULAR | Status: AC
  Administered 2023-02-07: 1 mg via INTRAVENOUS
  Filled 2023-02-07: qty 1

## 2023-02-07 MED ORDER — DEXTROSE 50 % IV SOLN: 0.0000 mL | INTRAVENOUS | Status: DC | PRN

## 2023-02-07 MED ORDER — INSULIN ASPART 100 UNIT/ML IJ SOLN
8.0000 [IU] | Freq: Once | INTRAMUSCULAR | Status: AC
  Administered 2023-02-07: 8 [IU] via SUBCUTANEOUS

## 2023-02-07 MED ORDER — SODIUM CHLORIDE 0.9 % IV SOLN
25.0000 mg | Freq: Once | INTRAVENOUS | Status: AC
  Administered 2023-02-07: 25 mg via INTRAVENOUS
  Filled 2023-02-07: qty 1

## 2023-02-07 MED ORDER — SODIUM CHLORIDE 0.9 % IV SOLN
12.5000 mg | Freq: Once | INTRAVENOUS | Status: AC
  Administered 2023-02-07: 12.5 mg via INTRAVENOUS
  Filled 2023-02-07: qty 0.5

## 2023-02-07 MED ORDER — LACTATED RINGERS IV SOLN: INTRAVENOUS | Status: DC

## 2023-02-07 MED ORDER — LACTATED RINGERS IV BOLUS
20.0000 mL/kg | Freq: Once | INTRAVENOUS | Status: AC
  Administered 2023-02-07: 1724 mL via INTRAVENOUS

## 2023-02-07 NOTE — ED Provider Notes (Signed)
[place] EMERGENCY DEPARTMENT AT [facility] Provider Note   CSN: [id number] Arrival date & time: 02/07/23  2595     History {Add pertinent medical, surgical, social history, OB history to HPI:1} Chief Complaint  Patient presents with   Loss of Consciousness    [name] is a 37 y.o. female.  She is brought in by her significant other for acute vomiting.  She passed out in the waiting room and was brought in emergently.  She is vomiting.  She was just discharged from the hospital after being admitted for DKA.  Multiple admissions for similar and gastroparesis.  The history is provided by the patient and the spouse.  Loss of Consciousness Episode history:  Single Most recent episode:  Today Progression:  Improving Associated symptoms: nausea and vomiting        Home Medications Prior to Admission medications   Medication Sig Start Date End Date Taking? Authorizing Provider  amitriptyline (ELAVIL) 25 MG tablet Take 75 mg by mouth at bedtime. 06/26/22   [provider]  Continuous Blood Gluc Transmit (DEXCOM G6 TRANSMITTER) MISC Inject 1 each into the skin every 14 (fourteen) days. 12/31/21   Dulce Sellar, NP  cyanocobalamin (VITAMIN B12) 1000 MCG/ML injection Inject 1,000 mcg into the muscle every 30 (thirty) days. 08/31/22   [provider]  DULoxetine (CYMBALTA) 60 MG capsule Take 60 mg by mouth daily. 06/28/22   [provider]  insulin aspart (NOVOLOG) 100 UNIT/ML FlexPen Inject 0-18 Units into the skin 3 (three) times daily with meals. Check Blood Glucose (BG) and inject per scale: BG <150= 0 unit; BG 150-200= 3 unit; BG 201-250= 6 unit; BG 251-300= 9 unit; BG 301-350= 12 unit; BG 351-400= 15 unit; BG >400= 18 unit and Call Primary Care. 02/04/23 05/05/23  Dahal, Melina Schools, MD  insulin detemir (LEVEMIR FLEXTOUCH) 100 UNIT/ML FlexPen Inject 15 Units into the skin 2 (two) times daily. 02/04/23   Lorin Glass, MD  Insulin Pen  Needle (B-D UF III MINI PEN NEEDLES) 31G X 5 MM MISC To use with Insulin pens, as directed 11/20/21   Dulce Sellar, NP  Insulin Syringes, Disposable, U-100 1 ML MISC Inject 1 each into the skin in the morning and at bedtime. 08/10/21   Dulce Sellar, NP  metoCLOPramide (REGLAN) 10 MG tablet Take 1 tablet (10 mg total) by mouth every 6 (six) hours as needed for nausea or vomiting. 12/31/22   Netta Corrigan, PA-C  metoCLOPramide (REGLAN) 10 MG tablet Take 1 tablet (10 mg total) by mouth 3 (three) times daily with meals for 14 days. 02/04/23 02/18/23  Lorin Glass, MD  mirtazapine (REMERON) 15 MG tablet Take 15-30 mg by mouth at bedtime. 01/16/23   [provider]  Oxycodone HCl 10 MG TABS Take 1 tablet (10 mg total) by mouth 2 (two) times daily as needed for up to 5 days (For pain). 02/04/23 02/09/23  Lorin Glass, MD      Allergies    Pramipexole    Review of Systems   Review of Systems  Unable to perform ROS: Mental status change  Cardiovascular:  Positive for syncope.  Gastrointestinal:  Positive for nausea and vomiting.    Physical Exam Updated Vital Signs LMP 01/19/2023 (Exact Date)  Physical Exam Vitals and nursing note reviewed.  Constitutional:      General: She is not in acute distress.    Appearance: Normal appearance. She is well-developed.  HENT:     Head: Normocephalic and atraumatic.  Eyes:     Conjunctiva/sclera: Conjunctivae normal.  Cardiovascular:     Rate and Rhythm: Regular rhythm. Tachycardia present.     Heart sounds: No murmur heard. Pulmonary:     Effort: Pulmonary effort is normal. No respiratory distress.     Breath sounds: Normal breath sounds.  Abdominal:     Palpations: Abdomen is soft.     Tenderness: There is no abdominal tenderness. There is no guarding or rebound.  Musculoskeletal:        General: No deformity. Normal range of motion.     Cervical back: Neck supple.  Skin:    General: Skin is warm and dry.     Capillary Refill:  Capillary refill takes less than 2 seconds.  Neurological:     General: No focal deficit present.     ED Results / Procedures / Treatments   Labs (all labs ordered are listed, but only abnormal results are displayed) Labs Reviewed  CBG MONITORING, ED - Abnormal; Notable for the following components:      Result Value   Glucose-Capillary 322 (*)    All other components within normal limits  PREGNANCY, URINE  BASIC METABOLIC PANEL  BASIC METABOLIC PANEL  BASIC METABOLIC PANEL  BASIC METABOLIC PANEL  BETA-HYDROXYBUTYRIC ACID  BETA-HYDROXYBUTYRIC ACID  CBC WITH DIFFERENTIAL/PLATELET  URINALYSIS, ROUTINE W REFLEX MICROSCOPIC  HCG, SERUM, QUALITATIVE  CBG MONITORING, ED  I-STAT VENOUS BLOOD GAS, ED    EKG None  Radiology No results found.  Procedures Procedures  {Document cardiac monitor, telemetry assessment procedure when appropriate:1}  Medications Ordered in ED Medications  lactated ringers bolus 1,724 mL (has no administration in time range)  insulin regular, human (MYXREDLIN) 100 units/ 100 mL infusion (has no administration in time range)  lactated ringers infusion (has no administration in time range)  dextrose 5 % in lactated ringers infusion (has no administration in time range)  dextrose 50 % solution 0-50 mL (has no administration in time range)    ED Course/ Medical Decision Making/ A&P   {   Click here for ABCD2, HEART and other calculatorsREFRESH Note before signing :1}                          Medical Decision Making Amount and/or Complexity of Data Reviewed Labs: ordered.  Risk Prescription drug management.   This patient complains of ***; this involves an extensive number of treatment Options and is a complaint that carries with it a high risk of complications and morbidity. The differential includes ***  I ordered, reviewed and interpreted labs, which included *** I ordered medication *** and reviewed PMP when indicated. I ordered imaging  studies which included *** and I independently    visualized and interpreted imaging which showed *** Additional history obtained from *** Previous records obtained and reviewed *** I consulted *** and discussed lab and imaging findings and discussed disposition.  Cardiac monitoring reviewed, *** Social determinants considered, *** Critical Interventions: ***  After the interventions stated above, I reevaluated the patient and found *** Admission and further testing considered, ***   {Document critical care time when appropriate:1} {Document review of labs and clinical decision tools ie heart score, Chads2Vasc2 etc:1}  {Document your independent review of radiology images, and any outside records:1} {Document your discussion with family members, caretakers, and with consultants:1} {Document social determinants of health affecting pt's care:1} {Document your decision making why or why not admission, treatments were needed:1} Final Clinical Impression(s) /  ED Diagnoses Final diagnoses:  None    Rx / DC Orders ED Discharge Orders     None

## 2023-02-07 NOTE — ED Notes (Signed)
Pt's CBG result was 322. Informed Dr. Charm Barges and Merry Proud - RN.

## 2023-02-07 NOTE — Discharge Instructions (Signed)
You are seen in the emergency department after a fainting spell in the setting of worsening nausea vomiting abdominal pain.  You were given fluids and pain and nausea medication with improvement in your symptoms.  Please continue regular medications and follow-up with your PCP and your GI specialist.  Return to the emergency department if any worsening or concerning symptoms

## 2023-02-07 NOTE — ED Triage Notes (Signed)
Loc on arrival. Brought by husband Per husband was "passing out at home" Started a few hours ago Low glucose at home 23  Vomiting

## 2023-02-08 ENCOUNTER — Encounter (HOSPITAL_BASED_OUTPATIENT_CLINIC_OR_DEPARTMENT_OTHER): Payer: Self-pay

## 2023-02-08 ENCOUNTER — Other Ambulatory Visit: Payer: Self-pay

## 2023-02-08 ENCOUNTER — Emergency Department (HOSPITAL_BASED_OUTPATIENT_CLINIC_OR_DEPARTMENT_OTHER)
Admission: EM | Admit: 2023-02-08 | Discharge: 2023-02-09 | Disposition: A | Discharge status: Home / Self Care | Payer: BC Managed Care – PPO | Source: Home / Self Care | Attending: Emergency Medicine | Admitting: Emergency Medicine

## 2023-02-08 ENCOUNTER — Emergency Department (HOSPITAL_BASED_OUTPATIENT_CLINIC_OR_DEPARTMENT_OTHER)
Admission: EM | Admit: 2023-02-08 | Discharge: 2023-02-08 | Discharge status: Against Medical Advice | Payer: BC Managed Care – PPO | Attending: Emergency Medicine | Admitting: Emergency Medicine

## 2023-02-08 DIAGNOSIS — F1210 Cannabis abuse, uncomplicated: Secondary | ICD-10-CM | POA: Diagnosis not present

## 2023-02-08 DIAGNOSIS — Z794 Long term (current) use of insulin: Secondary | ICD-10-CM | POA: Insufficient documentation

## 2023-02-08 DIAGNOSIS — R739 Hyperglycemia, unspecified: Secondary | ICD-10-CM

## 2023-02-08 DIAGNOSIS — E860 Dehydration: Secondary | ICD-10-CM | POA: Insufficient documentation

## 2023-02-08 DIAGNOSIS — E1040 Type 1 diabetes mellitus with diabetic neuropathy, unspecified: Secondary | ICD-10-CM | POA: Insufficient documentation

## 2023-02-08 DIAGNOSIS — E1065 Type 1 diabetes mellitus with hyperglycemia: Secondary | ICD-10-CM | POA: Insufficient documentation

## 2023-02-08 DIAGNOSIS — Z5329 Procedure and treatment not carried out because of patient's decision for other reasons: Secondary | ICD-10-CM | POA: Insufficient documentation

## 2023-02-08 DIAGNOSIS — G894 Chronic pain syndrome: Secondary | ICD-10-CM | POA: Insufficient documentation

## 2023-02-08 DIAGNOSIS — R000 Tachycardia, unspecified: Secondary | ICD-10-CM | POA: Insufficient documentation

## 2023-02-08 DIAGNOSIS — R1084 Generalized abdominal pain: Secondary | ICD-10-CM | POA: Diagnosis not present

## 2023-02-08 DIAGNOSIS — E876 Hypokalemia: Secondary | ICD-10-CM | POA: Diagnosis not present

## 2023-02-08 DIAGNOSIS — R112 Nausea with vomiting, unspecified: Secondary | ICD-10-CM

## 2023-02-08 DIAGNOSIS — D72829 Elevated white blood cell count, unspecified: Secondary | ICD-10-CM | POA: Insufficient documentation

## 2023-02-08 DIAGNOSIS — F1110 Opioid abuse, uncomplicated: Secondary | ICD-10-CM | POA: Diagnosis not present

## 2023-02-08 DIAGNOSIS — E1165 Type 2 diabetes mellitus with hyperglycemia: Secondary | ICD-10-CM | POA: Insufficient documentation

## 2023-02-08 LAB — URINALYSIS, ROUTINE W REFLEX MICROSCOPIC
Bacteria, UA: NONE SEEN
Bilirubin Urine: NEGATIVE
Glucose, UA: >1000 mg/dL — AB
Ketones, ur: 15 mg/dL — AB
Leukocytes,Ua: NEGATIVE
Nitrite: NEGATIVE
Protein, ur: 100 mg/dL — AB
Specific Gravity, Urine: 1.022 (ref 1.005–1.030)
pH: 8.5 — ABNORMAL HIGH (ref 5.0–8.0)

## 2023-02-08 LAB — LIPASE, BLOOD: Lipase: <10 U/L — ABNORMAL LOW (ref 11–51)

## 2023-02-08 LAB — COMPREHENSIVE METABOLIC PANEL
ALT: 21 U/L (ref 0–44)
AST: 16 U/L (ref 15–41)
Albumin: 4.6 g/dL (ref 3.5–5.0)
Alkaline Phosphatase: 87 U/L (ref 38–126)
Anion gap: 12 (ref 5–15)
BUN: 7 mg/dL (ref 6–20)
CO2: 31 mmol/L (ref 22–32)
Calcium: 10.1 mg/dL (ref 8.9–10.3)
Chloride: 92 mmol/L — ABNORMAL LOW (ref 98–111)
Creatinine, Ser: 0.67 mg/dL (ref 0.44–1.00)
GFR, Estimated: >60 mL/min (ref >60)
Glucose, Bld: 199 mg/dL — ABNORMAL HIGH (ref 70–99)
Potassium: 3.3 mmol/L — ABNORMAL LOW (ref 3.5–5.1)
Sodium: 135 mmol/L (ref 135–145)
Total Bilirubin: 0.4 mg/dL (ref 0.3–1.2)
Total Protein: 8 g/dL (ref 6.5–8.1)

## 2023-02-08 LAB — CBC
HCT: 42 % (ref 36.0–46.0)
Hemoglobin: 14.4 g/dL (ref 12.0–15.0)
MCH: 30.1 pg (ref 26.0–34.0)
MCHC: 34.3 g/dL (ref 30.0–36.0)
MCV: 87.7 fL (ref 80.0–100.0)
Platelets: 430 10*3/uL — ABNORMAL HIGH (ref 150–400)
RBC: 4.79 MIL/uL (ref 3.87–5.11)
RDW: 13.5 % (ref 11.5–15.5)
WBC: 11.4 10*3/uL — ABNORMAL HIGH (ref 4.0–10.5)
nRBC: 0 % (ref 0.0–0.2)

## 2023-02-08 LAB — RAPID URINE DRUG SCREEN, HOSP PERFORMED
Amphetamines: NOT DETECTED
Barbiturates: NOT DETECTED
Benzodiazepines: NOT DETECTED
Cocaine: NOT DETECTED
Opiates: POSITIVE — AB
Tetrahydrocannabinol: POSITIVE — AB

## 2023-02-08 LAB — CBG MONITORING, ED
Glucose-Capillary: 174 mg/dL — ABNORMAL HIGH (ref 70–99)
Glucose-Capillary: 201 mg/dL — ABNORMAL HIGH (ref 70–99)

## 2023-02-08 LAB — PREGNANCY, URINE: Preg Test, Ur: NEGATIVE

## 2023-02-08 MED ORDER — HYDROMORPHONE HCL 1 MG/ML IJ SOLN
0.5000 mg | Freq: Once | INTRAMUSCULAR | Status: AC
  Administered 2023-02-08: 0.5 mg via INTRAVENOUS
  Filled 2023-02-08: qty 1

## 2023-02-08 MED ORDER — DROPERIDOL 2.5 MG/ML IJ SOLN
1.2500 mg | Freq: Once | INTRAMUSCULAR | Status: AC
  Administered 2023-02-08: 1.25 mg via INTRAVENOUS
  Filled 2023-02-08: qty 2

## 2023-02-08 MED ORDER — ONDANSETRON 4 MG PO TBDP
4.0000 mg | ORAL_TABLET | Freq: Once | ORAL | Status: AC
  Administered 2023-02-08: 4 mg via ORAL
  Filled 2023-02-08: qty 1

## 2023-02-08 MED ORDER — POTASSIUM CHLORIDE CRYS ER 20 MEQ PO TBCR
40.0000 meq | EXTENDED_RELEASE_TABLET | Freq: Once | ORAL | Status: AC
  Administered 2023-02-08: 40 meq via ORAL
  Filled 2023-02-08: qty 2

## 2023-02-08 MED ORDER — ACETAMINOPHEN 500 MG PO TABS
1000.0000 mg | ORAL_TABLET | Freq: Once | ORAL | Status: AC
  Administered 2023-02-08: 1000 mg via ORAL
  Filled 2023-02-08: qty 2

## 2023-02-08 MED ORDER — SODIUM CHLORIDE 0.9 % IV BOLUS
1000.0000 mL | Freq: Once | INTRAVENOUS | Status: AC
  Administered 2023-02-09: 1000 mL via INTRAVENOUS

## 2023-02-08 MED ORDER — LACTATED RINGERS IV BOLUS
2000.0000 mL | Freq: Once | INTRAVENOUS | Status: AC
  Administered 2023-02-08: 2000 mL via INTRAVENOUS

## 2023-02-08 MED ORDER — KETOROLAC TROMETHAMINE 30 MG/ML IJ SOLN
30.0000 mg | Freq: Once | INTRAMUSCULAR | Status: AC
  Administered 2023-02-08: 30 mg via INTRAVENOUS
  Filled 2023-02-08: qty 1

## 2023-02-08 NOTE — ED Triage Notes (Signed)
Pt states that she was seen here and d/c'd overnight. Pt states that she is a type 1 diabetic and has been having emesis. Pt states she has had many episodes of vomiting since d/c. Denies diarrhea.  Pt states that phenergan and dilaudid last night helped with symptoms.

## 2023-02-08 NOTE — ED Notes (Signed)
Pt states that she usually needs USIV, requests [place] to hold off on labs with IV start

## 2023-02-08 NOTE — ED Provider Notes (Signed)
[place] EMERGENCY DEPARTMENT AT [facility] Provider Note   CSN: [id number] Arrival date & time: 02/08/23  4098     History  Chief Complaint  Patient presents with   Emesis    [name] is a 37 y.o. female.   Emesis  Patient is a 37 year old female with past medical history significant for diabetes, chronic constipation, C-section, D&C  Who presented emergency room today with complaints of generalized abdominal pain and several episodes of nausea and vomiting.  She states that she felt similar for the past few days came to emergency room and was EMS medications and felt improvement however feels that she is not quite "100% "better She denies any new symptoms.  Denies any vaginal discharge, urinary frequency urgency dysuria hematuria.  She denies any rectal pain or rectal bleeding.  No chest pain or difficulty breathing.  No syncope or near-syncope.  No fevers     Home Medications Prior to Admission medications   Medication Sig Start Date End Date Taking? Authorizing Provider  amitriptyline (ELAVIL) 25 MG tablet Take 75 mg by mouth at bedtime. 06/26/22   [provider]  Continuous Blood Gluc Transmit (DEXCOM G6 TRANSMITTER) MISC Inject 1 each into the skin every 14 (fourteen) days. 12/31/21   Dulce Sellar, NP  cyanocobalamin (VITAMIN B12) 1000 MCG/ML injection Inject 1,000 mcg into the muscle every 30 (thirty) days. 08/31/22   [provider]  DULoxetine (CYMBALTA) 60 MG capsule Take 60 mg by mouth daily. 06/28/22   [provider]  insulin aspart (NOVOLOG) 100 UNIT/ML FlexPen Inject 0-18 Units into the skin 3 (three) times daily with meals. Check Blood Glucose (BG) and inject per scale: BG <150= 0 unit; BG 150-200= 3 unit; BG 201-250= 6 unit; BG 251-300= 9 unit; BG 301-350= 12 unit; BG 351-400= 15 unit; BG >400= 18 unit and Call Primary Care. 02/04/23 05/05/23  Dahal, Melina Schools, MD  insulin detemir (LEVEMIR FLEXTOUCH) 100 UNIT/ML  FlexPen Inject 15 Units into the skin 2 (two) times daily. 02/04/23   Lorin Glass, MD  Insulin Pen Needle (B-D UF III MINI PEN NEEDLES) 31G X 5 MM MISC To use with Insulin pens, as directed 11/20/21   Dulce Sellar, NP  Insulin Syringes, Disposable, U-100 1 ML MISC Inject 1 each into the skin in the morning and at bedtime. 08/10/21   Dulce Sellar, NP  metoCLOPramide (REGLAN) 10 MG tablet Take 1 tablet (10 mg total) by mouth every 6 (six) hours as needed for nausea or vomiting. 12/31/22   Netta Corrigan, PA-C  metoCLOPramide (REGLAN) 10 MG tablet Take 1 tablet (10 mg total) by mouth 3 (three) times daily with meals for 14 days. 02/04/23 02/18/23  Lorin Glass, MD  mirtazapine (REMERON) 15 MG tablet Take 15-30 mg by mouth at bedtime. 01/16/23   [provider]  Oxycodone HCl 10 MG TABS Take 1 tablet (10 mg total) by mouth 2 (two) times daily as needed for up to 5 days (For pain). 02/04/23 02/09/23  Lorin Glass, MD      Allergies    Pramipexole    Review of Systems   Review of Systems  Gastrointestinal:  Positive for vomiting.    Physical Exam Updated Vital Signs BP (!) 142/88 (BP Location: Left Arm)   Pulse (!) 110   Temp 99.2 F (37.3 C) (Oral)   Resp 18   Ht 5\' 6"  (1.676 m)   Wt 86.2 kg   LMP 01/19/2023 (Exact Date)   SpO2 100%  BMI 30.67 kg/m  Physical Exam Vitals and nursing note reviewed.  Constitutional:      General: She is not in acute distress. HENT:     Head: Normocephalic and atraumatic.     Nose: Nose normal.     Mouth/Throat:     Mouth: Mucous membranes are moist.  Eyes:     General: No scleral icterus. Cardiovascular:     Rate and Rhythm: Regular rhythm. Tachycardia present.     Pulses: Normal pulses.     Heart sounds: Normal heart sounds.  Pulmonary:     Effort: Pulmonary effort is normal. No respiratory distress.     Breath sounds: No wheezing.  Abdominal:     Palpations: Abdomen is soft.     Tenderness: There is no abdominal  tenderness. There is no guarding or rebound.  Musculoskeletal:     Cervical back: Normal range of motion.     Right lower leg: No edema.     Left lower leg: No edema.  Skin:    General: Skin is warm and dry.     Capillary Refill: Capillary refill takes less than 2 seconds.  Neurological:     Mental Status: She is alert. Mental status is at baseline.  Psychiatric:        Mood and Affect: Mood normal.        Behavior: Behavior normal.     ED Results / Procedures / Treatments   Labs (all labs ordered are listed, but only abnormal results are displayed) Labs Reviewed  LIPASE, BLOOD - Abnormal; Notable for the following components:      Result Value   Lipase <10 (*)    All other components within normal limits  COMPREHENSIVE METABOLIC PANEL - Abnormal; Notable for the following components:   Potassium 3.3 (*)    Chloride 92 (*)    Glucose, Bld 199 (*)    All other components within normal limits  CBC - Abnormal; Notable for the following components:   WBC 11.4 (*)    Platelets 430 (*)    All other components within normal limits  URINALYSIS, ROUTINE W REFLEX MICROSCOPIC - Abnormal; Notable for the following components:   APPearance HAZY (*)    pH 8.5 (*)    Glucose, UA >1,000 (*)    Hgb urine dipstick SMALL (*)    Ketones, ur 15 (*)    Protein, ur 100 (*)    All other components within normal limits  RAPID URINE DRUG SCREEN, HOSP PERFORMED - Abnormal; Notable for the following components:   Opiates POSITIVE (*)    Tetrahydrocannabinol POSITIVE (*)    All other components within normal limits  CBG MONITORING, ED - Abnormal; Notable for the following components:   Glucose-Capillary 201 (*)    All other components within normal limits  PREGNANCY, URINE    EKG None  Radiology No results found.  Procedures Procedures    Medications Ordered in ED Medications  ondansetron (ZOFRAN-ODT) disintegrating tablet 4 mg (has no administration in time range)  potassium  chloride SA (KLOR-CON M) CR tablet 40 mEq (has no administration in time range)  acetaminophen (TYLENOL) tablet 1,000 mg (has no administration in time range)  ketorolac (TORADOL) 30 MG/ML injection 30 mg (has no administration in time range)  lactated ringers bolus 2,000 mL (2,000 mLs Intravenous New Bag/Given 02/08/23 1312)  HYDROmorphone (DILAUDID) injection 0.5 mg (0.5 mg Intravenous Given 02/08/23 1310)  droperidol (INAPSINE) 2.5 MG/ML injection 1.25 mg (1.25 mg Intravenous Given 02/08/23 1307)  ED Course/ Medical Decision Making/ A&P Clinical Course as of 02/08/23 1607  Sat Feb 08, 2023  1152 Home last night  [WF]    Clinical Course User Index [WF] Gailen Shelter, [place]                             Medical Decision Making Amount and/or Complexity of Data Reviewed Labs: ordered.  Risk OTC drugs. Prescription drug management.   This patient presents to the ED for concern of abdominal pain nausea vomiting, this involves a number of treatment options, and is a complaint that carries with it a moderate to high risk of complications and morbidity. A differential diagnosis was considered for the patient's symptoms which is discussed below:   The causes of generalized abdominal pain include but are not limited to AAA, mesenteric ischemia, appendicitis, diverticulitis, DKA, gastritis, gastroenteritis, AMI, nephrolithiasis, pancreatitis, peritonitis, adrenal insufficiency,lead poisoning, iron toxicity, intestinal ischemia, constipation, UTI,SBO/LBO, splenic rupture, biliary disease, IBD, IBS, PUD, or hepatitis. Ectopic pregnancy, ovarian torsion, PID.    Co morbidities: Discussed in HPI   Brief History:  Patient is a 37 year old female with past medical history significant for diabetes, chronic constipation, C-section, D&C  Who presented emergency room today with complaints of generalized abdominal pain and several episodes of nausea and vomiting.  She states that she felt similar  for the past few days came to emergency room and was EMS medications and felt improvement however feels that she is not quite "100% "better She denies any new symptoms.  Denies any vaginal discharge, urinary frequency urgency dysuria hematuria.  She denies any rectal pain or rectal bleeding.  No chest pain or difficulty breathing.  No syncope or near-syncope.  No fevers    EMR reviewed including pt PMHx, past surgical history and past visits to ER.   See HPI for more details   Lab Tests:   I ordered and independently interpreted labs. Labs notable for Mild nonspecific leukocytosis.  Mild elevation in platelets.  Lipase undetectable, CMP with mild hypokalemia repleted with lactated Ringer's.  Urinalysis with ketones however patient does not have an elevated anion gap, bicarb is normal, labs not consistent with DKA.  UDS positive for THC, urine pregnancy negative  Imaging Studies:  No imaging studies ordered for this patient  I did not order any images for this patient however I did review patient's last CT abdomen pelvis which was completed 4 months ago which showed no acute intra-abdominal pathology.  Cardiac Monitoring:  The patient was maintained on a cardiac monitor.  I personally viewed and interpreted the cardiac monitored which showed an underlying rhythm of: NSR NA   Medicines ordered:  I ordered medication including lactated Ringer's, Toradol, Tylenol, droperidol, analgesics  for pain and NV Reevaluation of the patient after these medicines showed that the patient improved I have reviewed the patients home medicines and have made adjustments as needed   Critical Interventions:     Consults/Attending Physician      Reevaluation:  After the interventions noted above I re-evaluated patient and found that they have :resolved   Social Determinants of Health:  The patient's social determinants of health were a factor in the care of this patient    Problem  List / ED Course:  Patient with recurrent nausea vomiting abdominal pain.  She does indicate that she is no longer using marijuana however does have positive THC in urine.  We had a shared decision-making conversation  about next steps and she agrees to hold off on using marijuana and hydrate, rest, use antiemetics at home and follow-up with gastroenterology.  She is tolerating p.o. and her symptoms have completely resolved.  She is somewhat tachycardic here with heart rates around 110 however she appears to be sinus on the monitor and she has had multiple mildly tachycardic vital signs over the past visits to the emergency room.  Recommend close outpatient follow-up.  Return precautions emergency room room were reviewed.   Dispostion:  After consideration of the diagnostic results and the patients response to treatment, I feel that the patent would benefit from discharge with outpatient follow-up.   Final Clinical Impression(s) / ED Diagnoses Final diagnoses:  Nausea and vomiting, unspecified vomiting type    Rx / DC Orders ED Discharge Orders     None         Gailen Shelter, [place] 02/10/23 1528    Margarita Grizzle, MD 02/12/23 [phone]

## 2023-02-08 NOTE — ED Triage Notes (Signed)
Pt's family member requested help to get patient out of the car. 3 staff members responded to the car. Pt was not responsive to [place]. She was laying the back seat. We placed patient on the EMS stretcher and took straight to the resus room. Pt had a pulse, but was not responding when her name was called. After approx 1 min, the patient was wide awake and talking. C/o hurting all over. States she keeps having these 'passing out" spells. Was seen earlier for same symptoms. Blood sugar on arrival was 174. Pt did have emesis times 2. Dr. Bebe Shaggy in to see patient.

## 2023-02-08 NOTE — ED Notes (Signed)
Pt returns with continued emesis and body aches. Pt reports that she was seen and discharged earlier, but is still feeling ill. Pt alert & oriented, nad noted.

## 2023-02-08 NOTE — ED Provider Notes (Signed)
[place] EMERGENCY DEPARTMENT AT [facility] Provider Note   CSN: [id number] Arrival date & time: 02/08/23  2319     History {Add pertinent medical, surgical, social history, OB history to HPI:1} Chief Complaint - syncope, vomiting   [name] is a 37 y.o. female.  The history is provided by the patient.  Patient with history of diabetes, gastroparesis/cyclic vomiting syndrome, chronic pain presents with vomiting and syncope.  Patient reports she was just in the ER for similar episode.  She reports after leaving she had multiple episodes of emesis followed by syncopal episode.  No seizures.  No traumatic injury from the syncopal episode.  She reports her whole body feels like it is on fire. She reports having these symptoms previously.    Past Medical History:  Diagnosis Date   Chronic constipation 11/08/2021   Diabetic neuropathy (HCC)    History of chicken pox    History of noncompliance with medical treatment 09/07/2022   Type 1 diabetes (HCC)    Vaginal odor 11/08/2021    Home Medications Prior to Admission medications   Medication Sig Start Date End Date Taking? Authorizing Provider  amitriptyline (ELAVIL) 25 MG tablet Take 75 mg by mouth at bedtime. 06/26/22   [provider]  Continuous Blood Gluc Transmit (DEXCOM G6 TRANSMITTER) MISC Inject 1 each into the skin every 14 (fourteen) days. 12/31/21   Dulce Sellar, NP  cyanocobalamin (VITAMIN B12) 1000 MCG/ML injection Inject 1,000 mcg into the muscle every 30 (thirty) days. 08/31/22   [provider]  DULoxetine (CYMBALTA) 60 MG capsule Take 60 mg by mouth daily. 06/28/22   [provider]  insulin aspart (NOVOLOG) 100 UNIT/ML FlexPen Inject 0-18 Units into the skin 3 (three) times daily with meals. Check Blood Glucose (BG) and inject per scale: BG <150= 0 unit; BG 150-200= 3 unit; BG 201-250= 6 unit; BG 251-300= 9 unit; BG 301-350= 12 unit; BG 351-400= 15 unit; BG >400=  18 unit and Call Primary Care. 02/04/23 05/05/23  Dahal, Melina Schools, MD  insulin detemir (LEVEMIR FLEXTOUCH) 100 UNIT/ML FlexPen Inject 15 Units into the skin 2 (two) times daily. 02/04/23   Lorin Glass, MD  Insulin Pen Needle (B-D UF III MINI PEN NEEDLES) 31G X 5 MM MISC To use with Insulin pens, as directed 11/20/21   Dulce Sellar, NP  Insulin Syringes, Disposable, U-100 1 ML MISC Inject 1 each into the skin in the morning and at bedtime. 08/10/21   Dulce Sellar, NP  metoCLOPramide (REGLAN) 10 MG tablet Take 1 tablet (10 mg total) by mouth every 6 (six) hours as needed for nausea or vomiting. 12/31/22   Netta Corrigan, PA-C  metoCLOPramide (REGLAN) 10 MG tablet Take 1 tablet (10 mg total) by mouth 3 (three) times daily with meals for 14 days. 02/04/23 02/18/23  Lorin Glass, MD  mirtazapine (REMERON) 15 MG tablet Take 15-30 mg by mouth at bedtime. 01/16/23   [provider]  Oxycodone HCl 10 MG TABS Take 1 tablet (10 mg total) by mouth 2 (two) times daily as needed for up to 5 days (For pain). 02/04/23 02/09/23  Lorin Glass, MD      Allergies    Pramipexole    Review of Systems   Review of Systems  Gastrointestinal:  Positive for abdominal pain and vomiting. Negative for diarrhea.  Musculoskeletal:  Positive for myalgias.    Physical Exam Updated Vital Signs LMP 01/19/2023 (Exact Date)  Physical Exam CONSTITUTIONAL: Chronically ill-appearing, anxious HEAD: Normocephalic/atraumatic  EYES: EOMI/PERRL, no icterus ENMT: Mucous membranes dry NECK: supple no meningeal signs SPINE/BACK:entire spine nontender CV: S1/S2 noted, tachycardic LUNGS: Lungs are clear to auscultation bilaterally, no apparent distress ABDOMEN: soft, nontender NEURO: Pt is awake/alert/appropriate, moves all extremitiesx4.  No facial droop.  No arm or leg drift EXTREMITIES: pulses normal/equal, full ROM, no deformities SKIN: warm, color normal PSYCH: Anxious  ED Results / Procedures / Treatments    Labs (all labs ordered are listed, but only abnormal results are displayed) Labs Reviewed  CBC  BASIC METABOLIC PANEL  CK    EKG EKG Interpretation  Date/Time:  Saturday February 08 2023 23:31:43 EDT Ventricular Rate:  119 PR Interval:  150 QRS Duration: 77 QT Interval:  327 QTC Calculation: 461 R Axis:   82 Text Interpretation: Sinus tachycardia Interpretation limited secondary to artifact Confirmed by Zadie Rhine ([id number]) on 02/08/2023 11:41:20 PM  Radiology No results found.  Procedures .Critical Care  Performed by: Zadie Rhine, MD Authorized by: Zadie Rhine, MD   Critical care provider statement:    Critical care start time:  02/08/2023 11:52 PM   Critical care end time:  02/08/2023 11:52 PM   Critical care time was exclusive of:  Separately billable procedures and treating other patients   Critical care was necessary to treat or prevent imminent or life-threatening deterioration of the following conditions:  Endocrine crisis, metabolic crisis and cardiac failure   Critical care was time spent personally by me on the following activities:  Examination of patient, re-evaluation of patient's condition, ordering and review of laboratory studies, review of old charts, obtaining history from patient or surrogate, development of treatment plan with patient or surrogate and evaluation of patient's response to treatment   I assumed direction of critical care for this patient from another provider in my specialty: no     {Document cardiac monitor, telemetry assessment procedure when appropriate:1}  Medications Ordered in ED Medications  sodium chloride 0.9 % bolus 1,000 mL (has no administration in time range)    ED Course/ Medical Decision Making/ A&P   {   Click here for ABCD2, HEART and other calculatorsREFRESH Note before signing :1}                          Medical Decision Making Amount and/or Complexity of Data Reviewed Labs: ordered.   This patient  presents to the ED for concern of syncope, this involves an extensive number of treatment options, and is a complaint that carries with it a high risk of complications and morbidity.  The differential diagnosis includes but is not limited to cardiac arrhythmia, seizure, orthostatic hypotension  Comorbidities that complicate the patient evaluation: Patient's presentation is complicated by their history of diabetes  Social Determinants of Health: Patient's  history of nonadherence, multiple ER visits   increases the complexity of managing their presentation  Additional history obtained: Records reviewed previous admission documents  Lab Tests: I Ordered, and personally interpreted labs.  The pertinent results include:  ***  Imaging Studies ordered: I ordered imaging studies including {imaging:26848}  I independently visualized and interpreted imaging which showed *** I agree with the radiologist interpretation  Cardiac Monitoring: The patient was maintained on a cardiac monitor.  I personally viewed and interpreted the cardiac monitor which showed an underlying rhythm of:  {cardiac monitor:26849}  Medicines ordered and prescription drug management: I ordered medication including ***  for ***  Reevaluation of the patient after these medicines showed that the  patient    {resolved/improved/worsened:23923::"improved"}  Test Considered: Patient is low risk / negative by ***, therefore do not feel that *** is indicated.  Critical Interventions:  ***  Consultations Obtained: I requested consultation with the {consultation:26851}, and discussed  findings as well as pertinent plan - they recommend: ***  Reevaluation: After the interventions noted above, I reevaluated the patient and found that they have :{resolved/improved/worsened:23923::"improved"}  Complexity of problems addressed: Patient's presentation is most consistent with  {UVOZ:36644}  Disposition: After consideration of the  diagnostic results and the patient's response to treatment,  I feel that the patent would benefit from {disposition:26850}.     {Document critical care time when appropriate:1} {Document review of labs and clinical decision tools ie heart score, Chads2Vasc2 etc:1}  {Document your independent review of radiology images, and any outside records:1} {Document your discussion with family members, caretakers, and with consultants:1} {Document social determinants of health affecting pt's care:1} {Document your decision making why or why not admission, treatments were needed:1} Final Clinical Impression(s) / ED Diagnoses Final diagnoses:  None    Rx / DC Orders ED Discharge Orders     None

## 2023-02-09 LAB — BASIC METABOLIC PANEL
Anion gap: 11 (ref 5–15)
BUN: 6 mg/dL (ref 6–20)
CO2: 29 mmol/L (ref 22–32)
Calcium: 9.5 mg/dL (ref 8.9–10.3)
Chloride: 96 mmol/L — ABNORMAL LOW (ref 98–111)
Creatinine, Ser: 0.75 mg/dL (ref 0.44–1.00)
GFR, Estimated: >60 mL/min (ref >60)
Glucose, Bld: 196 mg/dL — ABNORMAL HIGH (ref 70–99)
Potassium: 3.3 mmol/L — ABNORMAL LOW (ref 3.5–5.1)
Sodium: 136 mmol/L (ref 135–145)

## 2023-02-09 LAB — CBC
HCT: 37.3 % (ref 36.0–46.0)
Hemoglobin: 12.8 g/dL (ref 12.0–15.0)
MCH: 30.3 pg (ref 26.0–34.0)
MCHC: 34.3 g/dL (ref 30.0–36.0)
MCV: 88.4 fL (ref 80.0–100.0)
Platelets: 378 10*3/uL (ref 150–400)
RBC: 4.22 MIL/uL (ref 3.87–5.11)
RDW: 13.8 % (ref 11.5–15.5)
WBC: 9.8 10*3/uL (ref 4.0–10.5)
nRBC: 0 % (ref 0.0–0.2)

## 2023-02-09 LAB — CK: Total CK: 79 U/L (ref 38–234)

## 2023-02-09 MED ORDER — DIPHENHYDRAMINE HCL 50 MG/ML IJ SOLN
25.0000 mg | Freq: Once | INTRAMUSCULAR | Status: AC
  Administered 2023-02-09: 25 mg via INTRAVENOUS
  Filled 2023-02-09: qty 1

## 2023-02-09 MED ORDER — SODIUM CHLORIDE 0.9 % IV BOLUS
1000.0000 mL | Freq: Once | INTRAVENOUS | Status: AC
  Administered 2023-02-09: 1000 mL via INTRAVENOUS

## 2023-02-09 MED ORDER — HALOPERIDOL LACTATE 5 MG/ML IJ SOLN
2.0000 mg | Freq: Once | INTRAMUSCULAR | Status: AC
  Administered 2023-02-09: 2 mg via INTRAVENOUS
  Filled 2023-02-09: qty 1

## 2023-02-09 MED ORDER — METOCLOPRAMIDE HCL 5 MG/ML IJ SOLN
10.0000 mg | Freq: Once | INTRAMUSCULAR | Status: AC
  Administered 2023-02-09: 10 mg via INTRAVENOUS
  Filled 2023-02-09: qty 2

## 2023-02-09 MED ORDER — PROCHLORPERAZINE EDISYLATE 10 MG/2ML IJ SOLN
10.0000 mg | Freq: Once | INTRAMUSCULAR | Status: AC
  Administered 2023-02-09: 10 mg via INTRAVENOUS
  Filled 2023-02-09: qty 2

## 2023-02-09 NOTE — ED Notes (Signed)
Patient ambulates to bathroom and back to room without any complications.

## 2023-02-09 NOTE — ED Notes (Signed)
Patient states she would like to ambulate to bathroom. This nurse assist patient to bathroom. Patient also states her nausea is returning. Dr. Bebe Shaggy notified.

## 2023-03-31 ENCOUNTER — Emergency Department (HOSPITAL_COMMUNITY): Payer: BC Managed Care – PPO

## 2023-03-31 ENCOUNTER — Other Ambulatory Visit: Payer: Self-pay

## 2023-03-31 ENCOUNTER — Encounter (HOSPITAL_COMMUNITY): Payer: Self-pay

## 2023-03-31 ENCOUNTER — Inpatient Hospital Stay (HOSPITAL_COMMUNITY)
Admission: EM | Admit: 2023-03-31 | Discharge: 2023-04-02 | DRG: 639 | Disposition: A | Discharge status: Home / Self Care | Payer: BC Managed Care – PPO | Attending: Internal Medicine | Admitting: Internal Medicine

## 2023-03-31 DIAGNOSIS — E1010 Type 1 diabetes mellitus with ketoacidosis without coma: Principal | ICD-10-CM | POA: Diagnosis present

## 2023-03-31 DIAGNOSIS — R1084 Generalized abdominal pain: Secondary | ICD-10-CM | POA: Diagnosis present

## 2023-03-31 DIAGNOSIS — Z91199 Patient's noncompliance with other medical treatment and regimen due to unspecified reason: Secondary | ICD-10-CM

## 2023-03-31 DIAGNOSIS — Z794 Long term (current) use of insulin: Secondary | ICD-10-CM | POA: Diagnosis not present

## 2023-03-31 DIAGNOSIS — E1069 Type 1 diabetes mellitus with other specified complication: Secondary | ICD-10-CM | POA: Diagnosis present

## 2023-03-31 DIAGNOSIS — E0865 Diabetes mellitus due to underlying condition with hyperglycemia: Principal | ICD-10-CM

## 2023-03-31 DIAGNOSIS — K3184 Gastroparesis: Secondary | ICD-10-CM | POA: Diagnosis present

## 2023-03-31 DIAGNOSIS — Z79899 Other long term (current) drug therapy: Secondary | ICD-10-CM | POA: Diagnosis not present

## 2023-03-31 DIAGNOSIS — E1143 Type 2 diabetes mellitus with diabetic autonomic (poly)neuropathy: Secondary | ICD-10-CM | POA: Diagnosis present

## 2023-03-31 DIAGNOSIS — Z8249 Family history of ischemic heart disease and other diseases of the circulatory system: Secondary | ICD-10-CM | POA: Diagnosis not present

## 2023-03-31 DIAGNOSIS — E1065 Type 1 diabetes mellitus with hyperglycemia: Secondary | ICD-10-CM | POA: Diagnosis not present

## 2023-03-31 DIAGNOSIS — E1043 Type 1 diabetes mellitus with diabetic autonomic (poly)neuropathy: Secondary | ICD-10-CM | POA: Diagnosis present

## 2023-03-31 DIAGNOSIS — R000 Tachycardia, unspecified: Secondary | ICD-10-CM | POA: Diagnosis present

## 2023-03-31 DIAGNOSIS — I10 Essential (primary) hypertension: Secondary | ICD-10-CM | POA: Diagnosis present

## 2023-03-31 DIAGNOSIS — E1042 Type 1 diabetes mellitus with diabetic polyneuropathy: Secondary | ICD-10-CM | POA: Diagnosis present

## 2023-03-31 DIAGNOSIS — R1115 Cyclical vomiting syndrome unrelated to migraine: Secondary | ICD-10-CM | POA: Diagnosis not present

## 2023-03-31 DIAGNOSIS — R112 Nausea with vomiting, unspecified: Secondary | ICD-10-CM | POA: Diagnosis present

## 2023-03-31 DIAGNOSIS — E876 Hypokalemia: Secondary | ICD-10-CM | POA: Diagnosis present

## 2023-03-31 DIAGNOSIS — Z888 Allergy status to other drugs, medicaments and biological substances status: Secondary | ICD-10-CM

## 2023-03-31 DIAGNOSIS — E870 Hyperosmolality and hypernatremia: Secondary | ICD-10-CM | POA: Diagnosis present

## 2023-03-31 DIAGNOSIS — Z83511 Family history of glaucoma: Secondary | ICD-10-CM | POA: Diagnosis not present

## 2023-03-31 DIAGNOSIS — Z833 Family history of diabetes mellitus: Secondary | ICD-10-CM

## 2023-03-31 DIAGNOSIS — R55 Syncope and collapse: Secondary | ICD-10-CM | POA: Diagnosis present

## 2023-03-31 DIAGNOSIS — R1110 Vomiting, unspecified: Secondary | ICD-10-CM

## 2023-03-31 LAB — LACTIC ACID, PLASMA: Lactic Acid, Venous: 3.6 mmol/L (ref 0.5–1.9)

## 2023-03-31 LAB — I-STAT VENOUS BLOOD GAS, ED
Acid-Base Excess: 1 mmol/L (ref 0.0–2.0)
Bicarbonate: 23.6 mmol/L (ref 20.0–28.0)
Calcium, Ion: 1.09 mmol/L — ABNORMAL LOW (ref 1.15–1.40)
HCT: 40 % (ref 36.0–46.0)
Hemoglobin: 13.6 g/dL (ref 12.0–15.0)
O2 Saturation: 82 %
Potassium: 4.2 mmol/L (ref 3.5–5.1)
Sodium: 133 mmol/L — ABNORMAL LOW (ref 135–145)
TCO2: 25 mmol/L (ref 22–32)
pCO2, Ven: 32 mmHg — ABNORMAL LOW (ref 44–60)
pH, Ven: 7.476 — ABNORMAL HIGH (ref 7.25–7.43)
pO2, Ven: 42 mmHg (ref 32–45)

## 2023-03-31 LAB — CREATININE, SERUM
Creatinine, Ser: 1.03 mg/dL — ABNORMAL HIGH (ref 0.44–1.00)
GFR, Estimated: >60 mL/min (ref >60)

## 2023-03-31 LAB — CBC WITH DIFFERENTIAL/PLATELET
Abs Immature Granulocytes: 0.03 10*3/uL (ref 0.00–0.07)
Basophils Absolute: 0 10*3/uL (ref 0.0–0.1)
Basophils Relative: 0 %
Eosinophils Absolute: 0 10*3/uL (ref 0.0–0.5)
Eosinophils Relative: 0 %
HCT: 38.4 % (ref 36.0–46.0)
Hemoglobin: 13.2 g/dL (ref 12.0–15.0)
Immature Granulocytes: 0 %
Lymphocytes Relative: 14 %
Lymphs Abs: 1.3 10*3/uL (ref 0.7–4.0)
MCH: 30.6 pg (ref 26.0–34.0)
MCHC: 34.4 g/dL (ref 30.0–36.0)
MCV: 88.9 fL (ref 80.0–100.0)
Monocytes Absolute: 0.2 10*3/uL (ref 0.1–1.0)
Monocytes Relative: 3 %
Neutro Abs: 7.5 10*3/uL (ref 1.7–7.7)
Neutrophils Relative %: 83 %
Platelets: 329 10*3/uL (ref 150–400)
RBC: 4.32 MIL/uL (ref 3.87–5.11)
RDW: 12.9 % (ref 11.5–15.5)
WBC: 9 10*3/uL (ref 4.0–10.5)
nRBC: 0 % (ref 0.0–0.2)

## 2023-03-31 LAB — COMPREHENSIVE METABOLIC PANEL
ALT: 16 U/L (ref 0–44)
AST: 15 U/L (ref 15–41)
Albumin: 3.9 g/dL (ref 3.5–5.0)
Alkaline Phosphatase: 98 U/L (ref 38–126)
Anion gap: 18 — ABNORMAL HIGH (ref 5–15)
BUN: 9 mg/dL (ref 6–20)
CO2: 20 mmol/L — ABNORMAL LOW (ref 22–32)
Calcium: 9.5 mg/dL (ref 8.9–10.3)
Chloride: 93 mmol/L — ABNORMAL LOW (ref 98–111)
Creatinine, Ser: 0.95 mg/dL (ref 0.44–1.00)
GFR, Estimated: >60 mL/min (ref >60)
Glucose, Bld: 484 mg/dL — ABNORMAL HIGH (ref 70–99)
Potassium: 4.2 mmol/L (ref 3.5–5.1)
Sodium: 131 mmol/L — ABNORMAL LOW (ref 135–145)
Total Bilirubin: 0.7 mg/dL (ref 0.3–1.2)
Total Protein: 7.7 g/dL (ref 6.5–8.1)

## 2023-03-31 LAB — CBC
HCT: 35.6 % — ABNORMAL LOW (ref 36.0–46.0)
Hemoglobin: 11.9 g/dL — ABNORMAL LOW (ref 12.0–15.0)
MCH: 29.8 pg (ref 26.0–34.0)
MCHC: 33.4 g/dL (ref 30.0–36.0)
MCV: 89 fL (ref 80.0–100.0)
Platelets: 294 10*3/uL (ref 150–400)
RBC: 4 MIL/uL (ref 3.87–5.11)
RDW: 12.9 % (ref 11.5–15.5)
WBC: 11.6 10*3/uL — ABNORMAL HIGH (ref 4.0–10.5)
nRBC: 0 % (ref 0.0–0.2)

## 2023-03-31 LAB — I-STAT CHEM 8, ED
BUN: 9 mg/dL (ref 6–20)
Calcium, Ion: 1.12 mmol/L — ABNORMAL LOW (ref 1.15–1.40)
Chloride: 98 mmol/L (ref 98–111)
Creatinine, Ser: 0.7 mg/dL (ref 0.44–1.00)
Glucose, Bld: 501 mg/dL (ref 70–99)
HCT: 41 % (ref 36.0–46.0)
Hemoglobin: 13.9 g/dL (ref 12.0–15.0)
Potassium: 4.3 mmol/L (ref 3.5–5.1)
Sodium: 132 mmol/L — ABNORMAL LOW (ref 135–145)
TCO2: 20 mmol/L — ABNORMAL LOW (ref 22–32)

## 2023-03-31 LAB — URINALYSIS, ROUTINE W REFLEX MICROSCOPIC
Bilirubin Urine: NEGATIVE
Glucose, UA: >=500 mg/dL — AB
Ketones, ur: 20 mg/dL — AB
Nitrite: NEGATIVE
Protein, ur: 100 mg/dL — AB
RBC / HPF: >50 RBC/hpf (ref 0–5)
Specific Gravity, Urine: 1.03 (ref 1.005–1.030)
pH: 8 (ref 5.0–8.0)

## 2023-03-31 LAB — CBG MONITORING, ED
Glucose-Capillary: 461 mg/dL — ABNORMAL HIGH (ref 70–99)
Glucose-Capillary: 367 mg/dL — ABNORMAL HIGH (ref 70–99)
Glucose-Capillary: 297 mg/dL — ABNORMAL HIGH (ref 70–99)

## 2023-03-31 LAB — I-STAT CG4 LACTIC ACID, ED: Lactic Acid, Venous: 2.7 mmol/L (ref 0.5–1.9)

## 2023-03-31 LAB — GLUCOSE, CAPILLARY: Glucose-Capillary: 224 mg/dL — ABNORMAL HIGH (ref 70–99)

## 2023-03-31 LAB — PHOSPHORUS: Phosphorus: 2.2 mg/dL — ABNORMAL LOW (ref 2.5–4.6)

## 2023-03-31 LAB — BETA-HYDROXYBUTYRIC ACID: Beta-Hydroxybutyric Acid: 1.45 mmol/L — ABNORMAL HIGH (ref 0.05–0.27)

## 2023-03-31 LAB — LIPASE, BLOOD: Lipase: 24 U/L (ref 11–51)

## 2023-03-31 LAB — TROPONIN I (HIGH SENSITIVITY): Troponin I (High Sensitivity): 3 ng/L (ref <18)

## 2023-03-31 LAB — MAGNESIUM: Magnesium: 1.9 mg/dL (ref 1.7–2.4)

## 2023-03-31 LAB — HCG, SERUM, QUALITATIVE: Preg, Serum: NEGATIVE

## 2023-03-31 MED ORDER — FENTANYL CITRATE PF 50 MCG/ML IJ SOSY
50.0000 ug | PREFILLED_SYRINGE | Freq: Once | INTRAMUSCULAR | Status: AC
  Administered 2023-03-31: 50 ug via INTRAVENOUS
  Filled 2023-03-31: qty 1

## 2023-03-31 MED ORDER — AMITRIPTYLINE HCL 25 MG PO TABS
75.0000 mg | ORAL_TABLET | Freq: Every day | ORAL | Status: DC
  Administered 2023-03-31 – 2023-04-02 (×2): 75 mg via ORAL
  Filled 2023-03-31 (×2): qty 3
  Filled 2023-03-31: qty 1

## 2023-03-31 MED ORDER — ONDANSETRON HCL 4 MG/2ML IJ SOLN: 4.0000 mg | Freq: Once | INTRAMUSCULAR | Status: DC

## 2023-03-31 MED ORDER — HYDROMORPHONE HCL 1 MG/ML IJ SOLN
1.0000 mg | Freq: Once | INTRAMUSCULAR | Status: AC
  Administered 2023-03-31: 1 mg via INTRAVENOUS
  Filled 2023-03-31: qty 1

## 2023-03-31 MED ORDER — DULOXETINE HCL 60 MG PO CPEP
60.0000 mg | ORAL_CAPSULE | Freq: Every day | ORAL | Status: DC
  Administered 2023-04-01 – 2023-04-02 (×2): 60 mg via ORAL
  Filled 2023-03-31 (×2): qty 1

## 2023-03-31 MED ORDER — SODIUM CHLORIDE 0.9 % IV SOLN
12.5000 mg | Freq: Four times a day (QID) | INTRAVENOUS | Status: DC | PRN
  Administered 2023-03-31 – 2023-04-01 (×2): 12.5 mg via INTRAVENOUS
  Filled 2023-03-31 (×2): qty 12.5

## 2023-03-31 MED ORDER — LACTATED RINGERS IV BOLUS
1000.0000 mL | Freq: Once | INTRAVENOUS | Status: AC
  Administered 2023-03-31: 1000 mL via INTRAVENOUS

## 2023-03-31 MED ORDER — METOCLOPRAMIDE HCL 5 MG/ML IJ SOLN
10.0000 mg | Freq: Once | INTRAMUSCULAR | Status: AC
  Administered 2023-03-31: 10 mg via INTRAVENOUS
  Filled 2023-03-31: qty 2

## 2023-03-31 MED ORDER — ACETAMINOPHEN 650 MG RE SUPP: 650.0000 mg | Freq: Four times a day (QID) | RECTAL | Status: DC | PRN

## 2023-03-31 MED ORDER — ACETAMINOPHEN 325 MG PO TABS
650.0000 mg | ORAL_TABLET | Freq: Four times a day (QID) | ORAL | Status: DC | PRN
  Administered 2023-04-01: 650 mg via ORAL
  Filled 2023-03-31 (×2): qty 2

## 2023-03-31 MED ORDER — METOCLOPRAMIDE HCL 5 MG/ML IJ SOLN
5.0000 mg | Freq: Four times a day (QID) | INTRAMUSCULAR | Status: AC
  Administered 2023-03-31 – 2023-04-01 (×3): 5 mg via INTRAVENOUS
  Filled 2023-03-31 (×3): qty 2

## 2023-03-31 MED ORDER — HEPARIN SODIUM (PORCINE) 5000 UNIT/ML IJ SOLN
5000.0000 [IU] | Freq: Three times a day (TID) | INTRAMUSCULAR | Status: DC
  Administered 2023-03-31 – 2023-04-02 (×5): 5000 [IU] via SUBCUTANEOUS
  Filled 2023-03-31 (×5): qty 1

## 2023-03-31 MED ORDER — DEXTROSE IN LACTATED RINGERS 5 % IV SOLN: INTRAVENOUS | Status: DC

## 2023-03-31 MED ORDER — HYDROMORPHONE HCL 1 MG/ML IJ SOLN
0.5000 mg | INTRAMUSCULAR | Status: DC | PRN
  Administered 2023-04-01 (×3): 0.5 mg via INTRAVENOUS
  Filled 2023-03-31 (×3): qty 0.5

## 2023-03-31 MED ORDER — ONDANSETRON HCL 4 MG/2ML IJ SOLN
4.0000 mg | Freq: Once | INTRAMUSCULAR | Status: AC
  Administered 2023-03-31: 4 mg via INTRAVENOUS
  Filled 2023-03-31: qty 2

## 2023-03-31 MED ORDER — INSULIN REGULAR(HUMAN) IN NACL 100-0.9 UT/100ML-% IV SOLN
INTRAVENOUS | Status: DC
  Administered 2023-03-31: 11 [IU]/h via INTRAVENOUS
  Filled 2023-03-31: qty 100

## 2023-03-31 MED ORDER — DEXTROSE 50 % IV SOLN: 0.0000 mL | INTRAVENOUS | Status: DC | PRN

## 2023-03-31 MED ORDER — LACTATED RINGERS IV SOLN: INTRAVENOUS | Status: DC

## 2023-03-31 NOTE — H&P (Incomplete)
PCP:   Patient, No Pcp Per   Chief Complaint:  Syncope, nausea, vomiting.  HPI: This is a 37 year old female with past medical history of T1 DM, peripheral neuropathy, noncompliant.  Today her approximately 8 AM she developed intractable nausea and vomiting.  She had generalized abdominal pain which she describes as severe.  Her entire body ached especially her feet which often occurs when her glucose is elevated.  Per patient her glucose was 419 at home.  She then syncopized.  Per husband (contacted via phone ) he came into the house and found patient on the floor.  He believes she was out for a total of 20 to 30 minutes.  On awakening patient was confused.  He is not sure when the confusion lifted or if she fully recognize him as she was taken to the ER in the ambulance.  Per husband patient has syncopized with previous gastroparesis episodes.  There is no reports of seizure-like activity.  Patient not on insulin pump.  Per patient abdominal pain was more than his typical.  In the ER patient vitals 151/104, 113, 14, afebrile.  Glucose of 484,, CO2 20, AG 18, magnesium 1.9, phosphorus 2.2.  pH is 7.47.  Patient given 1 L LR bolus.  Insulin drip initiated.  Review of Systems:  Per HPI  Past Medical History: Past Medical History:  Diagnosis Date   Chronic constipation 11/08/2021   Diabetic neuropathy (HCC)    History of chicken pox    History of noncompliance with medical treatment 09/07/2022   Type 1 diabetes (HCC)    Vaginal odor 11/08/2021   Past Surgical History:  Procedure Laterality Date   CESAREAN SECTION     only one C/S per pt.   CO2 LASER APPLICATION N/A 02/22/2022   Procedure: CO2 LASER APPLICATION TO VULVA;  Surgeon: Carver Fila, MD;  Location: [facility];  Service: General;  Laterality: N/A;   DILATION AND CURETTAGE OF UTERUS     EXCISION OF SKIN TAG N/A 02/22/2022   Procedure: EXCISION OF ANAL SKIN TAGS;  Surgeon: Romie Levee, MD;  Location: [facility];  Service: General;  Laterality: N/A;    Medications: Prior to Admission medications   Medication Sig Start Date End Date Taking? Authorizing Provider  amitriptyline (ELAVIL) 25 MG tablet Take 75 mg by mouth at bedtime. 06/26/22   [provider]  Continuous Blood Gluc Transmit (DEXCOM G6 TRANSMITTER) MISC Inject 1 each into the skin every 14 (fourteen) days. 12/31/21   Dulce Sellar, NP  cyanocobalamin (VITAMIN B12) 1000 MCG/ML injection Inject 1,000 mcg into the muscle every 30 (thirty) days. 08/31/22   [provider]  DULoxetine (CYMBALTA) 60 MG capsule Take 60 mg by mouth daily. 06/28/22   [provider]  insulin aspart (NOVOLOG) 100 UNIT/ML FlexPen Inject 0-18 Units into the skin 3 (three) times daily with meals. Check Blood Glucose (BG) and inject per scale: BG <150= 0 unit; BG 150-200= 3 unit; BG 201-250= 6 unit; BG 251-300= 9 unit; BG 301-350= 12 unit; BG 351-400= 15 unit; BG >400= 18 unit and Call Primary Care. 02/04/23 05/05/23  Dahal, Melina Schools, MD  insulin detemir (LEVEMIR FLEXTOUCH) 100 UNIT/ML FlexPen Inject 15 Units into the skin 2 (two) times daily. 02/04/23   Lorin Glass, MD  Insulin Pen Needle (B-D UF III MINI PEN NEEDLES) 31G X 5 MM MISC To use with Insulin pens, as directed 11/20/21   Dulce Sellar, NP  Insulin Syringes, Disposable, U-100 1 ML  MISC Inject 1 each into the skin in the morning and at bedtime. 08/10/21   Dulce Sellar, NP  metoCLOPramide (REGLAN) 10 MG tablet Take 1 tablet (10 mg total) by mouth every 6 (six) hours as needed for nausea or vomiting. 12/31/22   Netta Corrigan, PA-C  metoCLOPramide (REGLAN) 10 MG tablet Take 1 tablet (10 mg total) by mouth 3 (three) times daily with meals for 14 days. 02/04/23 02/18/23  Lorin Glass, MD  mirtazapine (REMERON) 15 MG tablet Take 15-30 mg by mouth at bedtime. 01/16/23   [provider]    Allergies:   Allergies  Allergen Reactions   Pramipexole Shortness  Of Breath    Social History:  reports that she has never smoked. She has never been exposed to tobacco smoke. She has never used smokeless tobacco. She reports that she does not drink alcohol and does not use drugs.  Family History: Family History  Problem Relation Age of Onset   Hypertension Mother    Glaucoma Mother    Diabetes Maternal Grandmother    Neuropathy Neg Hx    Colon cancer Neg Hx    Stomach cancer Neg Hx    Esophageal cancer Neg Hx    Colon polyps Neg Hx    Rectal cancer Neg Hx     Physical Exam: Vitals:   03/31/23 1630 03/31/23 1700 03/31/23 1730 03/31/23 1745  BP: (!) 157/113 (!) 174/117 (!) 191/86 (!) 158/92  Pulse: (!) 115 (!) 122 (!) 121 (!) 113  Resp: (!) 22 (!) 26 15 18   Temp:      TempSrc:      SpO2: 100% 100% 100% 100%  Weight:      Height:        General:  Alert and oriented times three, well developed and nourished, ill-appearing Eyes: PERRLA, pink conjunctiva, no scleral icterus ENT: Dry oral mucosa, neck supple, no thyromegaly Lungs: CTA B/L, no wheeze, no crackles, no use of accessory muscles CV: Tachycardic, RRR, no regurgitation, no gallops, no murmurs. No carotid bruits, no JVD Abdomen: soft, positive BS, positive generalized TTP, not an acute abdomen GU: not examined Neuro: CN II - XII grossly intact, sensation intact Musculoskeletal: strength 5/5 all extremities, no edema Skin: no rash, no subcutaneous crepitation, no decubitus Psych: appropriate patient   Labs on Admission:  Recent Labs    03/31/23 1539 03/31/23 1620 03/31/23 1620 03/31/23 1632 03/31/23 1633  NA  --  131*   < > 133* 132*  K  --  4.2   < > 4.2 4.3  CL  --  93*  --   --  98  CO2  --  20*  --   --   --   GLUCOSE  --  484*  --   --  501*  BUN  --  9  --   --  9  CREATININE  --  0.95  --   --  0.70  CALCIUM  --  9.5  --   --   --   MG 1.9  --   --   --   --   PHOS 2.2*  --   --   --   --    < > = values in this interval not displayed.   Recent Labs     03/31/23 1620  AST 15  ALT 16  ALKPHOS 98  BILITOT 0.7  PROT 7.7  ALBUMIN 3.9   Recent Labs    03/31/23 1620  LIPASE  24   Recent Labs    03/31/23 1620 03/31/23 1632 03/31/23 1633  WBC 9.0  --   --   NEUTROABS 7.5  --   --   HGB 13.2 13.6 13.9  HCT 38.4 40.0 41.0  MCV 88.9  --   --   PLT 329  --   --      Radiological Exams on Admission: DG Chest 1 View  Result Date: 03/31/2023 CLINICAL DATA:  DKA EXAM: CHEST  1 VIEW COMPARISON:  02/02/2023 FINDINGS: Low lung volumes. Lungs are essentially clear. No pleural effusion or pneumothorax. The heart is normal in size. IMPRESSION: No acute cardiopulmonary disease. Electronically Signed   By: Charline Bills M.D.   On: 03/31/2023 18:05    Assessment/Plan Present on Admission:  HHS in patient with T1DM -Insulin drip initiated -No evidence of DKA   Syncope and collapse -likely secondary to pain  -EKG at this tachycardia, 3.. -Given the length of time patient was reportedly unconscious, will order echo in a.m. and carotid ultrasound -No evidence of seizure activity   Nausea, vomiting, abdominal pain  // diabetic gastroparesis (HCC) -IV promethazine scheduled -IV Reglan as needed -IV fluid hydration -Dilaudid 0.5 mg every 3 hours as needed -CT abdomen pelvis given patient's pain and syncope.   Diabetic polyneuropathy    [name] 03/31/2023, 8:54 PM

## 2023-03-31 NOTE — ED Provider Notes (Signed)
EMERGENCY DEPARTMENT AT [facility] Provider Note  MDM   HPI/ROS:  [name] is a 37 y.o. female with type 1 diabetes presenting with chief complaint of total body pain, and nausea, vomiting, and subjective fevers.  Symptoms all started this morning.  Upon initial evaluation, patient is extremely uncomfortable, writhing in pain and clutching an emesis bag.  Claims she has had no changes to her insulin regimen, but checked her blood sugar just before presenting to the emergency department and noted it was 419.  Patient has been in DKA before and feels that this is similar to prior DKA presentations.  Per chart review, patient was admitted to the hospital in June of this year for mild DKA with anion gap of 18, BHB of 1.6, but in the context of normal pH.  She left AMA before insulin regimen was able to be fully optimized.  Physical exam is notable for: - Acute distress, complaining of pain all over - Abdominal exam benign - Cardiopulmonary exam benign  On my initial evaluation, patient is:  -Vital signs stable aside from persistent tachycardia with rates in the 110s -Additional history obtained from mother at bedside  Given the patient's history and physical exam, differential diagnosis includes is not limited to DKA, hyperglycemic crisis, HHS, intra-abdominal pathology, etc.  Interpretations, interventions, and the patient's course of care are documented below.    Initially glucose in the 400s.  DKA workup initiated.  IV fluid resuscitation initiated.  Nausea and pain meds administered.  Upon reassessment, patient resting comfortably with no new symptoms.  Patient remains hyperglycemic but with normal pH.  Mildly increased anion gap.  Lactic acid resulted 2.7.  BHB of 1.45.  Posted for admission to hospitalist service for hyperglycemic crisis verging on DKA.  Please see inpatient provider notes for further details.  Disposition:  I discussed the case  with hospitalist medicine who graciously agreed to admit the patient to their service for continued care.   Clinical Impression:  1. Diabetes mellitus due to underlying condition with hyperglycemia, with long-term current use of insulin (HCC)   2. Vomiting in adult     Rx / DC Orders ED Discharge Orders     None       The plan for this patient was discussed with Dr. Jodi Mourning, who voiced agreement and who oversaw evaluation and treatment of this patient.   Clinical Complexity A medically appropriate history, review of systems, and physical exam was performed.  My independent interpretations of EKG, labs, and radiology are documented in the ED course above.   Click here for ABCD2, HEART and other calculatorsREFRESH Note before signing   Patient's presentation is most consistent with acute presentation with potential threat to life or bodily function.  Medical Decision Making Amount and/or Complexity of Data Reviewed Labs: ordered. Decision-making details documented in ED Course. Radiology: ordered.  Risk Prescription drug management. Decision regarding hospitalization.    HPI/ROS      See MDM section for pertinent HPI and ROS. A complete ROS was performed with pertinent positives/negatives noted above.   Past Medical History:  Diagnosis Date   Chronic constipation 11/08/2021   Diabetic neuropathy (HCC)    History of chicken pox    History of noncompliance with medical treatment 09/07/2022   Type 1 diabetes (HCC)    Vaginal odor 11/08/2021    Past Surgical History:  Procedure Laterality Date   CESAREAN SECTION     only one C/S per pt.  CO2 LASER APPLICATION N/A 02/22/2022   Procedure: CO2 LASER APPLICATION TO VULVA;  Surgeon: Carver Fila, MD;  Location: [facility];  Service: General;  Laterality: N/A;   DILATION AND CURETTAGE OF UTERUS     EXCISION OF SKIN TAG N/A 02/22/2022   Procedure: EXCISION OF ANAL SKIN TAGS;  Surgeon: Romie Levee, MD;   Location: [facility];  Service: General;  Laterality: N/A;      Physical Exam   Vitals:   03/31/23 1730 03/31/23 1745 03/31/23 2215 03/31/23 2251  BP: (!) 191/86 (!) 158/92 125/85   Pulse: (!) 121 (!) 113 (!) 118 (!) 119  Resp: 15 18  20   Temp:    98.8 F (37.1 C)  TempSrc:    Oral  SpO2: 100% 100% 100% 99%  Weight:      Height:        Physical Exam Vitals and nursing note reviewed.  Constitutional:      General: She is in acute distress.     Appearance: She is well-developed. She is ill-appearing and toxic-appearing.  HENT:     Head: Normocephalic and atraumatic.  Eyes:     Conjunctiva/sclera: Conjunctivae normal.  Cardiovascular:     Rate and Rhythm: Regular rhythm. Tachycardia present.     Heart sounds: No murmur heard. Pulmonary:     Effort: Pulmonary effort is normal. No respiratory distress.     Breath sounds: Normal breath sounds.  Abdominal:     Palpations: Abdomen is soft.     Tenderness: There is no abdominal tenderness.  Musculoskeletal:        General: No swelling.     Cervical back: Neck supple.  Skin:    General: Skin is warm and dry.  Neurological:     Mental Status: She is alert.  Psychiatric:        Mood and Affect: Mood is anxious.     Caitlyn Arms, MD Department of Emergency Medicine   Please note that this documentation was produced with the assistance of voice-to-text technology and may contain errors.    Caitlyn Iha, MD 04/01/23 [name]    Caitlyn Ohara, MD 04/01/23 [phone]

## 2023-03-31 NOTE — ED Notes (Addendum)
Patient ambulated to the bathroom assisted by family, then became unresponsive. Patient returned to stretcher. MD notified and responded to bedside. Episode lasted approximately 5 min. VSS remained stable throughout event. Patient alert and oriented at this time. Will continue to monitor.

## 2023-03-31 NOTE — ED Triage Notes (Signed)
From home via ems with c/o abdominal pain, nausea, and vomiting x today. Husband called 911 stating patient was unresponsive and that he was administering CPR. Upon EMS patient responsive to pain, pulses present. Patient ambulated to stretcher. Alert and oriented at this time. VSS.

## 2023-04-01 ENCOUNTER — Other Ambulatory Visit (HOSPITAL_COMMUNITY): Payer: Self-pay

## 2023-04-01 ENCOUNTER — Inpatient Hospital Stay (HOSPITAL_COMMUNITY): Payer: BC Managed Care – PPO

## 2023-04-01 DIAGNOSIS — E1065 Type 1 diabetes mellitus with hyperglycemia: Secondary | ICD-10-CM

## 2023-04-01 DIAGNOSIS — R55 Syncope and collapse: Secondary | ICD-10-CM

## 2023-04-01 DIAGNOSIS — E1069 Type 1 diabetes mellitus with other specified complication: Secondary | ICD-10-CM | POA: Diagnosis not present

## 2023-04-01 DIAGNOSIS — E870 Hyperosmolality and hypernatremia: Secondary | ICD-10-CM | POA: Diagnosis not present

## 2023-04-01 LAB — BASIC METABOLIC PANEL
Anion gap: 14 (ref 5–15)
BUN: 10 mg/dL (ref 6–20)
CO2: 23 mmol/L (ref 22–32)
Calcium: 9.1 mg/dL (ref 8.9–10.3)
Chloride: 99 mmol/L (ref 98–111)
Creatinine, Ser: 0.84 mg/dL (ref 0.44–1.00)
GFR, Estimated: >60 mL/min (ref >60)
Glucose, Bld: 159 mg/dL — ABNORMAL HIGH (ref 70–99)
Potassium: 3.5 mmol/L (ref 3.5–5.1)
Sodium: 136 mmol/L (ref 135–145)

## 2023-04-01 LAB — GLUCOSE, CAPILLARY
Glucose-Capillary: 131 mg/dL — ABNORMAL HIGH (ref 70–99)
Glucose-Capillary: 195 mg/dL — ABNORMAL HIGH (ref 70–99)
Glucose-Capillary: 189 mg/dL — ABNORMAL HIGH (ref 70–99)
Glucose-Capillary: 168 mg/dL — ABNORMAL HIGH (ref 70–99)
Glucose-Capillary: 154 mg/dL — ABNORMAL HIGH (ref 70–99)
Glucose-Capillary: 123 mg/dL — ABNORMAL HIGH (ref 70–99)
Glucose-Capillary: 281 mg/dL — ABNORMAL HIGH (ref 70–99)
Glucose-Capillary: 209 mg/dL — ABNORMAL HIGH (ref 70–99)

## 2023-04-01 LAB — CBC
HCT: 35 % — ABNORMAL LOW (ref 36.0–46.0)
Hemoglobin: 11.7 g/dL — ABNORMAL LOW (ref 12.0–15.0)
MCH: 29.9 pg (ref 26.0–34.0)
MCHC: 33.4 g/dL (ref 30.0–36.0)
MCV: 89.5 fL (ref 80.0–100.0)
Platelets: 302 10*3/uL (ref 150–400)
RBC: 3.91 MIL/uL (ref 3.87–5.11)
RDW: 13.2 % (ref 11.5–15.5)
WBC: 10.6 10*3/uL — ABNORMAL HIGH (ref 4.0–10.5)
nRBC: 0 % (ref 0.0–0.2)

## 2023-04-01 LAB — LACTIC ACID, PLASMA
Lactic Acid, Venous: 1.4 mmol/L (ref 0.5–1.9)
Lactic Acid, Venous: 1.1 mmol/L (ref 0.5–1.9)

## 2023-04-01 LAB — ECHOCARDIOGRAM COMPLETE
Area-P 1/2: 5.54 cm2
Height: 66 in
S' Lateral: 2.5 cm
Weight: 2800 oz

## 2023-04-01 LAB — TSH: TSH: 0.932 u[IU]/mL (ref 0.350–4.500)

## 2023-04-01 LAB — MAGNESIUM: Magnesium: 1.9 mg/dL (ref 1.7–2.4)

## 2023-04-01 LAB — LIPASE, BLOOD: Lipase: 17 U/L (ref 11–51)

## 2023-04-01 MED ORDER — LACTATED RINGERS IV BOLUS
1000.0000 mL | Freq: Once | INTRAVENOUS | Status: AC
  Administered 2023-04-01: 1000 mL via INTRAVENOUS

## 2023-04-01 MED ORDER — INSULIN DETEMIR 100 UNIT/ML ~~LOC~~ SOLN: 14.0000 [IU] | Freq: Every day | SUBCUTANEOUS | Status: DC

## 2023-04-01 MED ORDER — ONDANSETRON HCL 4 MG/2ML IJ SOLN
4.0000 mg | Freq: Four times a day (QID) | INTRAMUSCULAR | Status: DC | PRN
  Administered 2023-04-01 (×2): 4 mg via INTRAVENOUS
  Filled 2023-04-01 (×2): qty 2

## 2023-04-01 MED ORDER — LACTATED RINGERS IV SOLN: INTRAVENOUS | Status: DC

## 2023-04-01 MED ORDER — LACTATED RINGERS IV BOLUS
500.0000 mL | Freq: Once | INTRAVENOUS | Status: AC
  Administered 2023-04-01: 500 mL via INTRAVENOUS

## 2023-04-01 MED ORDER — INSULIN ASPART 100 UNIT/ML IJ SOLN
0.0000 [IU] | Freq: Three times a day (TID) | INTRAMUSCULAR | Status: DC
  Administered 2023-04-01: 5 [IU] via SUBCUTANEOUS
  Administered 2023-04-01: 8 [IU] via SUBCUTANEOUS
  Administered 2023-04-02: 2 [IU] via SUBCUTANEOUS

## 2023-04-01 MED ORDER — INSULIN DETEMIR 100 UNIT/ML FLEXPEN: 15.0000 [IU] | PEN_INJECTOR | Freq: Two times a day (BID) | SUBCUTANEOUS | Status: DC

## 2023-04-01 MED ORDER — INSULIN DETEMIR 100 UNIT/ML ~~LOC~~ SOLN
15.0000 [IU] | Freq: Two times a day (BID) | SUBCUTANEOUS | Status: DC
  Administered 2023-04-01 – 2023-04-02 (×3): 15 [IU] via SUBCUTANEOUS
  Filled 2023-04-01 (×4): qty 0.15

## 2023-04-01 MED ORDER — IOHEXOL 350 MG/ML SOLN
75.0000 mL | Freq: Once | INTRAVENOUS | Status: AC | PRN
  Administered 2023-04-01: 75 mL via INTRAVENOUS

## 2023-04-01 MED ORDER — METOPROLOL TARTRATE 50 MG PO TABS
50.0000 mg | ORAL_TABLET | Freq: Two times a day (BID) | ORAL | Status: DC
  Administered 2023-04-01 – 2023-04-02 (×3): 50 mg via ORAL
  Filled 2023-04-01 (×3): qty 1

## 2023-04-01 MED ORDER — CYANOCOBALAMIN 1000 MCG/ML IJ SOLN
1000.0000 ug | INTRAMUSCULAR | Status: DC
  Administered 2023-04-01: 1000 ug via INTRAMUSCULAR
  Filled 2023-04-01: qty 1

## 2023-04-01 MED ORDER — POTASSIUM CHLORIDE CRYS ER 20 MEQ PO TBCR
40.0000 meq | EXTENDED_RELEASE_TABLET | Freq: Once | ORAL | Status: AC
  Administered 2023-04-01: 40 meq via ORAL
  Filled 2023-04-01: qty 2

## 2023-04-01 MED ORDER — INSULIN ASPART 100 UNIT/ML IJ SOLN: 0.0000 [IU] | Freq: Three times a day (TID) | INTRAMUSCULAR | Status: DC

## 2023-04-01 MED ORDER — INSULIN ASPART 100 UNIT/ML IJ SOLN: 0.0000 [IU] | Freq: Every day | INTRAMUSCULAR | Status: DC

## 2023-04-01 NOTE — Plan of Care (Signed)

## 2023-04-01 NOTE — Progress Notes (Addendum)
PROGRESS NOTE                                                                                                                                                                                                             Patient Demographics:    [name], is a 37 y.o. female, DOB - [birth year], ZOX:[id number]  Outpatient Primary MD for the patient is Patient, No Pcp Per    LOS - 1  Admit date - 03/31/2023    Chief Complaint  Patient presents with   Abdominal Pain   Nausea       Brief Narrative (HPI from H&P)   37 year old female with past medical history of T1 DM, peripheral neuropathy, noncompliant. Today her approximately 8 AM she developed intractable nausea and vomiting.  Also had intractable abdominal pain, after an episode of emesis she subsequently passed out and was on the floor for about 30 minutes found by the husband and then brought to the ER where she was found to have DKA and admitted.   Subjective:    [name] today has, No headache, No chest pain, mild epigastric discomfort and some nausea, No new weakness tingling or numbness, no shortness of breath.   Assessment  & Plan :   Syncope collapse.  Sounds vasovagal associated with episode of emesis, echocardiogram stable, blood pressure and hemodynamic stable, carotid Doppler pending, check EEG as well, continue to monitor on telemetry advance activity, hydrate with IV fluids and monitor with PT OT.    Hypertension.  Low-dose beta-blocker and check TSH.    DKA in a patient with underlying DM type I.  Compliant with insulin however A1c suggests poor outpatient control due to hyperglycemia, DKA has resolved, transition to subcu insulin.  Post discharge follow-up with PCP.   Lab Results  Component Value Date   HGBA1C 12.2 (H) 12/30/2022   CBG (last 3)  Recent Labs    04/01/23 0528 04/01/23 0635 04/01/23 0741  GLUCAP 168* 154* 123*          Condition - Fair  Family Communication  :  None  Code Status :  Full  Consults  :  None  PUD Prophylaxis : PPI   Procedures  :     CT -   Carotid Doppler.       TTE - 1.  Left ventricular ejection fraction, by estimation, is 65 to 70%. The left ventricle has normal function. The left ventricle has no regional wall motion abnormalities. Left ventricular diastolic parameters were normal.  2. Right ventricular systolic function is normal. The right ventricular size is normal. Tricuspid regurgitation signal is inadequate for assessing PA pressure.  3. The mitral valve is normal in structure. No evidence of mitral valve regurgitation. No evidence of mitral stenosis.  4. The aortic valve is tricuspid. Aortic valve regurgitation is not visualized. No aortic stenosis is present.  5. The inferior vena cava is normal in size with greater than 50% respiratory variability, suggesting right atrial pressure of 3 mmHg      Disposition Plan  :    Status is: Inpatient  DVT Prophylaxis  :    heparin injection 5,000 Units Start: 03/31/23 2200    Lab Results  Component Value Date   PLT 302 04/01/2023    Diet :  Diet Order             DIET SOFT Fluid consistency: Thin  Diet effective now                    Inpatient Medications  Scheduled Meds:  amitriptyline  75 mg Oral QHS   cyanocobalamin  1,000 mcg Intramuscular Q30 days   DULoxetine  60 mg Oral Daily   heparin  5,000 Units Subcutaneous Q8H   insulin aspart  0-15 Units Subcutaneous TID WC   insulin aspart  0-5 Units Subcutaneous QHS   insulin detemir  15 Units Subcutaneous BID   metoCLOPramide (REGLAN) injection  5 mg Intravenous Q6H   metoprolol tartrate  50 mg Oral BID   potassium chloride  40 mEq Oral Once   Continuous Infusions:  lactated ringers     promethazine (PHENERGAN) injection (IM or IVPB) 12.5 mg (04/01/23 1048)   PRN Meds:.acetaminophen **OR** acetaminophen, dextrose, promethazine (PHENERGAN)  injection (IM or IVPB)  Antibiotics  :    Anti-infectives (From admission, onward)    None         Objective:   Vitals:   04/01/23 0600 04/01/23 0630 04/01/23 0700 04/01/23 0800  BP: (!) 145/84 96/75 101/69   Pulse: (!) 120 (!) 104 (!) 102   Resp: 14 14 13    Temp:    98 F (36.7 C)  TempSrc:    Oral  SpO2: 100% 94% 97%   Weight:      Height:        Wt Readings from Last 3 Encounters:  03/31/23 79.4 kg  02/08/23 86.2 kg  02/02/23 86.2 kg     Intake/Output Summary (Last 24 hours) at 04/01/2023 1104 Last data filed at 04/01/2023 1610 Gross per 24 hour  Intake 2143.75 ml  Output --  Net 2143.75 ml     Physical Exam  Awake Alert, No new F.N deficits, Normal affect [place].AT,PERRAL Supple Neck, No JVD,   Symmetrical Chest wall movement, Good air movement bilaterally, CTAB RRR,No Gallops,Rubs or new Murmurs,  +ve B.Sounds, Abd Soft, No tenderness,   No Cyanosis, Clubbing or edema       Data Review:    Recent Labs  Lab 03/31/23 1620 03/31/23 1632 03/31/23 1633 03/31/23 2136 04/01/23 0554  WBC 9.0  --   --  11.6* 10.6*  HGB 13.2 13.6 13.9 11.9* 11.7*  HCT 38.4 40.0 41.0 35.6* 35.0*  PLT 329  --   --  294 302  MCV 88.9  --   --  89.0 89.5  MCH 30.6  --   --  29.8 29.9  MCHC 34.4  --   --  33.4 33.4  RDW 12.9  --   --  12.9 13.2  LYMPHSABS 1.3  --   --   --   --   MONOABS 0.2  --   --   --   --   EOSABS 0.0  --   --   --   --   BASOSABS 0.0  --   --   --   --     Recent Labs  Lab 03/31/23 1539 03/31/23 1620 03/31/23 1632 03/31/23 1633 03/31/23 2136 04/01/23 0032 04/01/23 0554 04/01/23 0652 04/01/23 0930  NA  --  131* 133* 132*  --   --  136  --   --   K  --  4.2 4.2 4.3  --   --  3.5  --   --   CL  --  93*  --  98  --   --  99  --   --   CO2  --  20*  --   --   --   --  23  --   --   ANIONGAP  --  18*  --   --   --   --  14  --   --   GLUCOSE  --  484*  --  501*  --   --  159*  --   --   BUN  --  9  --  9  --   --  10  --   --    CREATININE  --  0.95  --  0.70 1.03*  --  0.84  --   --   AST  --  15  --   --   --   --   --   --   --   ALT  --  16  --   --   --   --   --   --   --   ALKPHOS  --  98  --   --   --   --   --   --   --   BILITOT  --  0.7  --   --   --   --   --   --   --   ALBUMIN  --  3.9  --   --   --   --   --   --   --   LATICACIDVEN  --   --   --  2.7* 3.6* 2.5*  --  1.4 1.1  MG 1.9  --   --   --   --   --  1.9  --   --   CALCIUM  --  9.5  --   --   --   --  9.1  --   --       Recent Labs  Lab 03/31/23 1539 03/31/23 1620 03/31/23 1633 03/31/23 2136 04/01/23 0032 04/01/23 0554 04/01/23 0652 04/01/23 0930  LATICACIDVEN  --   --  2.7* 3.6* 2.5*  --  1.4 1.1  MG 1.9  --   --   --   --  1.9  --   --   CALCIUM  --  9.5  --   --   --  9.1  --   --    Lab Results  Component Value Date  HGBA1C 12.2 (H) 12/30/2022     Radiology Reports ECHOCARDIOGRAM COMPLETE  Result Date: 04/01/2023    ECHOCARDIOGRAM REPORT   Patient Name:   [name] Date of Exam: 04/01/2023 Medical Rec #:  [id number]                Height:       66.0 in Accession #:    [id number]               Weight:       175.0 lb Date of Birth:  Dec 06, 1985                BSA:          1.889 m Patient Age:    37 years                 BP:           96/75 mmHg Patient Gender: F                        HR:           115 bpm. Exam Location:  Inpatient Procedure: 2D Echo, Cardiac Doppler and Color Doppler Indications:    Syncope R55  History:        Patient has no prior history of Echocardiogram examinations.                 Risk Factors:Diabetes.  Sonographer:    Lucendia Herrlich Referring Phys: Effie Shy Ran Tullis K [facility] IMPRESSIONS  1. Left ventricular ejection fraction, by estimation, is 65 to 70%. The left ventricle has normal function. The left ventricle has no regional wall motion abnormalities. Left ventricular diastolic parameters were normal.  2. Right ventricular systolic function is normal. The right ventricular size is normal.  Tricuspid regurgitation signal is inadequate for assessing PA pressure.  3. The mitral valve is normal in structure. No evidence of mitral valve regurgitation. No evidence of mitral stenosis.  4. The aortic valve is tricuspid. Aortic valve regurgitation is not visualized. No aortic stenosis is present.  5. The inferior vena cava is normal in size with greater than 50% respiratory variability, suggesting right atrial pressure of 3 mmHg. Comparison(s): No prior Echocardiogram. Conclusion(s)/Recommendation(s): Normal biventricular function without evidence of hemodynamically significant valvular heart disease. FINDINGS  Left Ventricle: Left ventricular ejection fraction, by estimation, is 65 to 70%. The left ventricle has normal function. The left ventricle has no regional wall motion abnormalities. The left ventricular internal cavity size was normal in size. There is  no left ventricular hypertrophy. Left ventricular diastolic parameters were normal. Right Ventricle: The right ventricular size is normal. No increase in right ventricular wall thickness. Right ventricular systolic function is normal. Tricuspid regurgitation signal is inadequate for assessing PA pressure. Left Atrium: Left atrial size was normal in size. Right Atrium: Right atrial size was normal in size. Pericardium: Trivial pericardial effusion is present. The pericardial effusion is posterior to the left ventricle. Mitral Valve: The mitral valve is normal in structure. No evidence of mitral valve regurgitation. No evidence of mitral valve stenosis. Tricuspid Valve: The tricuspid valve is normal in structure. Tricuspid valve regurgitation is not demonstrated. No evidence of tricuspid stenosis. Aortic Valve: The aortic valve is tricuspid. Aortic valve regurgitation is not visualized. No aortic stenosis is present. Pulmonic Valve: The pulmonic valve was normal in structure. Pulmonic valve regurgitation is not visualized. No evidence of pulmonic  stenosis. Aorta: The aortic root,  ascending aorta and aortic arch are all structurally normal, with no evidence of dilitation or obstruction. Venous: The inferior vena cava is normal in size with greater than 50% respiratory variability, suggesting right atrial pressure of 3 mmHg. IAS/Shunts: No atrial level shunt detected by color flow Doppler.  LEFT VENTRICLE PLAX 2D LVIDd:         4.00 cm   Diastology LVIDs:         2.50 cm   LV e' medial:    10.90 cm/s LV PW:         0.90 cm   LV E/e' medial:  8.4 LV IVS:        0.80 cm   LV e' lateral:   15.20 cm/s LVOT diam:     1.90 cm   LV E/e' lateral: 6.0 LV SV:         46 LV SV Index:   24 LVOT Area:     2.84 cm  RIGHT VENTRICLE             IVC RV S prime:     14.30 cm/s  IVC diam: 1.30 cm TAPSE (M-mode): 2.4 cm LEFT ATRIUM             Index        RIGHT ATRIUM           Index LA diam:        2.90 cm 1.53 cm/m   RA Area:     11.40 cm LA Vol (A2C):   22.6 ml 11.96 ml/m  RA Volume:   25.00 ml  13.23 ml/m LA Vol (A4C):   36.1 ml 19.11 ml/m LA Biplane Vol: 31.1 ml 16.46 ml/m  AORTIC VALVE LVOT Vmax:   111.00 cm/s LVOT Vmean:  71.200 cm/s LVOT VTI:    0.163 m  AORTA Ao Root diam: 3.00 cm Ao Asc diam:  3.00 cm MITRAL VALVE                TRICUSPID VALVE MV Area (PHT): 5.54 cm     TR Peak grad:   6.6 mmHg MV Decel Time: 137 msec     TR Vmax:        128.00 cm/s MV E velocity: 91.50 cm/s MV A velocity: 106.00 cm/s  SHUNTS MV E/A ratio:  0.86         Systemic VTI:  0.16 m                             Systemic Diam: 1.90 cm Riley Lam MD Electronically signed by Riley Lam MD Signature Date/Time: 04/01/2023/10:34:21 AM    Final    DG Abd Portable 1V  Result Date: 04/01/2023 CLINICAL DATA:  Upset stomach. EXAM: PORTABLE ABDOMEN - 1 VIEW COMPARISON:  None Available. FINDINGS: No gaseous bowel dilatation. No radio-opaque calculi or other significant radiographic abnormality are seen. IMPRESSION: Negative. CT imaging could be used to further evaluate as  clinically warranted. Electronically Signed   By: Kennith Center M.D.   On: 04/01/2023 06:17   DG Chest 1 View  Result Date: 03/31/2023 CLINICAL DATA:  DKA EXAM: CHEST  1 VIEW COMPARISON:  02/02/2023 FINDINGS: Low lung volumes. Lungs are essentially clear. No pleural effusion or pneumothorax. The heart is normal in size. IMPRESSION: No acute cardiopulmonary disease. Electronically Signed   By: Charline Bills M.D.   On: 03/31/2023 18:05      Signature  -  Susa Raring M.D on 04/01/2023 at 11:04 AM   -  To page go to [email or web address]

## 2023-04-01 NOTE — Inpatient Diabetes Management (Signed)
Inpatient Diabetes Program Recommendations  AACE/ADA: New Consensus Statement on Inpatient Glycemic Control (2015)  Target Ranges:  Prepandial:   less than 140 mg/dL      Peak postprandial:   less than 180 mg/dL (1-2 hours)      Critically ill patients:  140 - 180 mg/dL   Lab Results  Component Value Date   GLUCAP 281 (H) 04/01/2023   HGBA1C 12.2 (H) 12/30/2022    Review of Glycemic Control  Latest Reference Range & Units 04/01/23 07:41 04/01/23 11:35  Glucose-Capillary 70 - 99 mg/dL 098 (H) 119 (H)  (H): Data is abnormally high  Diabetes history: DM1 Outpatient Diabetes medications: Novolog 0-18 units TID, Levemir 15 units BID Current orders for Inpatient glycemic control: Transitioned from IV insulin this morning to Levemir 15 units BID, Novolog 0-9 units TID  Inpatient Diabetes Program Recommendations:    Please consider adding meal coverage TID.   Spoke with her at bedside.  She confirms above home medications.  She denies skipping doses of insulin.  She is current with her PCP-does not see endocrinology.  She would benefit from carb coverage at home in addition to correction given the fact that she has type 1 DM.    She states she has been prescribed the Dexcom G6 but has not been able to get it at the pharmacy for some reason.  Recommend she use the G7 as it does not require a transmitter or scanning.  Will ask MD for permission to place a G7 prior to DC.  Asked pharmacy for benefit check-pending.   Her Last A1C was 12.2% (average blood glucose of 303 mg/dL).  Educated on The Plate Method, CHO's, portion control, CBGs at home fasting and mid afternoon, F/U with PCP every 3 months, bring meter/phone to PCP office, long and short term complications of uncontrolled BG, and importance of exercise.  Will continue to follow while inpatient.  Thank you, Dulce Sellar, MSN, CDCES Diabetes Coordinator Inpatient Diabetes Program [phone] (team pager from 8a-5p)

## 2023-04-01 NOTE — Progress Notes (Signed)
Echocardiogram 2D Echocardiogram has been performed.  Lucendia Herrlich 04/01/2023, 9:48 AM

## 2023-04-01 NOTE — Plan of Care (Signed)

## 2023-04-01 NOTE — Progress Notes (Signed)
TRH night cross cover note:  Transition to SQ insulin order set initiated. Levemir 15 units SQ bid ordered with first dose at 0630. Will continue insulin drip/D5LR for 2 hours following first dose of sq insulin, followed by initiation of LR at 125 cc/hr starting at 0830.   I have also ordered CBG to be checked at the time of discontinuation of the insulin drip, with associated orders for corrective sliding scale insulin based upon this CBG value.  Subsequent to that, I have initiated ensuing CBG monitoring in the form of QAC/HS CBG checks with associated sliding scale insulin coverage.   Of important note: AM BMP pending. Will closely monitor for that result to ensure closure of AG. If AG not closed, will need to remain on insulin drip.     Newton Pigg, DO Hospitalist

## 2023-04-01 NOTE — Progress Notes (Signed)
Carotid arterial duplex completed. Please see CV Procedures for preliminary results.  Shona Simpson, RVT 04/01/23 10:51 AM

## 2023-04-01 NOTE — Progress Notes (Signed)
TRH night cross cover note:   I was notified by RN of patient's latest cbg result of 131. D5LR being started at this time. As AG has not yet closed, will continue existing insulin drip via endotool, with q1 hour cbg monitoring. For monitoring of patient's AG, I've added q4 hour BMP's through 9 AM on 04/01/23.      Newton Pigg, DO Hospitalist

## 2023-04-01 NOTE — TOC Benefit Eligibility Note (Signed)
Patient Product/process development scientist completed.    The patient is insured through [facility]. Patient has [employer], may use a copay card, and/or apply for patient assistance if available.    Ran test claim for Dexcom G7 Sensor and the current 30 day co-pay is $5.00.   This test claim was processed through [facility]- copay amounts may vary at other pharmacies due to pharmacy/plan contracts, or as the patient moves through the different stages of their insurance plan.     Roland Earl, CPHT Pharmacy Patient Advocate Specialist [facility] Patient Advocate Team Direct Number: [phone]  Fax: [phone]

## 2023-04-01 NOTE — Evaluation (Signed)
Physical Therapy Evaluation and Discharge Patient Details Name: [name] MRN: [id number] DOB: [birth year] Today's Date: 04/01/2023  History of Present Illness  37 year old female presented 03/31/23 with intractable nausea and vomiting followed by syncope.  She had generalized abdominal pain which she describes as severe. CBG 501. 8/13 orthostatic BPs were negative.  PMH T1 DM, peripheral neuropathy, noncompliant.  Clinical Impression   Patient evaluated by Physical Therapy with no further acute PT needs identified. Patient's resting HR was 119 and max HR 132 bpm during ambulation. Pt asymptomatic other than nauseated (the entire session, even before walking).  PT is signing off. Thank you for this referral.         If plan is discharge home, recommend the following:     Can travel by private vehicle        Equipment Recommendations None recommended by PT  Recommendations for Other Services       Functional Status Assessment Patient has not had a recent decline in their functional status     Precautions / Restrictions Precautions Precautions: None      Mobility  Bed Mobility Overal bed mobility: Independent                  Transfers Overall transfer level: Independent                      Ambulation/Gait Ambulation/Gait assistance: Modified independent (Device/Increase time) Gait Distance (Feet): 350 Feet Assistive device: IV Pole Gait Pattern/deviations: WFL(Within Functional Limits)       General Gait Details: HR 120-132; sats 96%  Stairs            Wheelchair Mobility     Tilt Bed    Modified Rankin (Stroke Patients Only)       Balance Overall balance assessment: Independent                                           Pertinent Vitals/Pain Pain Assessment Pain Assessment: Faces Faces Pain Scale: Hurts a little bit Pain Location: abdomen Pain Descriptors / Indicators: Sore Pain  Intervention(s): Limited activity within patient's tolerance, Monitored during session    Home Living Family/patient expects to be discharged to:: Private residence Living Arrangements: Children;Spouse/significant other                 Additional Comments: Home setup not gathered as pt is Independent    Prior Function Prior Level of Function : Independent/Modified Independent                     Extremity/Trunk Assessment   Upper Extremity Assessment Upper Extremity Assessment: Overall WFL for tasks assessed    Lower Extremity Assessment Lower Extremity Assessment: Overall WFL for tasks assessed    Cervical / Trunk Assessment Cervical / Trunk Assessment: Normal  Communication   Communication Communication: No apparent difficulties  Cognition Arousal: Alert Behavior During Therapy: WFL for tasks assessed/performed Overall Cognitive Status: Within Functional Limits for tasks assessed                                          General Comments      Exercises     Assessment/Plan    PT Assessment Patient does not need any further  PT services  PT Problem List         PT Treatment Interventions      PT Goals (Current goals can be found in the Care Plan section)  Acute Rehab PT Goals PT Goal Formulation: All assessment and education complete, DC therapy    Frequency       Co-evaluation               AM-PAC PT "6 Clicks" Mobility  Outcome Measure Help needed turning from your back to your side while in a flat bed without using bedrails?: None Help needed moving from lying on your back to sitting on the side of a flat bed without using bedrails?: None Help needed moving to and from a bed to a chair (including a wheelchair)?: None Help needed standing up from a chair using your arms (e.g., wheelchair or bedside chair)?: None Help needed to walk in hospital room?: None Help needed climbing 3-5 steps with a railing? : None 6  Click Score: 24    End of Session   Activity Tolerance: Patient tolerated treatment well Patient left: in bed;with call bell/phone within reach Nurse Communication: Mobility status;Other (comment) (no PT needs) PT Visit Diagnosis: Other abnormalities of gait and mobility (R26.89)    Time: 1914-7829 PT Time Calculation (min) (ACUTE ONLY): 15 min   Charges:   PT Evaluation $PT Eval Low Complexity: 1 Low   PT General Charges $$ ACUTE PT VISIT: 1 Visit          Jerolyn Center, PT Acute Rehabilitation Services  Office [phone]   Zena Amos 04/01/2023, 3:23 PM

## 2023-04-02 ENCOUNTER — Other Ambulatory Visit (HOSPITAL_COMMUNITY): Payer: BC Managed Care – PPO

## 2023-04-02 ENCOUNTER — Other Ambulatory Visit (HOSPITAL_COMMUNITY): Payer: Self-pay

## 2023-04-02 DIAGNOSIS — E1065 Type 1 diabetes mellitus with hyperglycemia: Secondary | ICD-10-CM | POA: Diagnosis not present

## 2023-04-02 DIAGNOSIS — E870 Hyperosmolality and hypernatremia: Secondary | ICD-10-CM | POA: Diagnosis not present

## 2023-04-02 DIAGNOSIS — E1069 Type 1 diabetes mellitus with other specified complication: Secondary | ICD-10-CM | POA: Diagnosis not present

## 2023-04-02 LAB — GLUCOSE, CAPILLARY
Glucose-Capillary: 126 mg/dL — ABNORMAL HIGH (ref 70–99)
Glucose-Capillary: 129 mg/dL — ABNORMAL HIGH (ref 70–99)

## 2023-04-02 MED ORDER — PROMETHAZINE HCL 25 MG PO TABS
25.0000 mg | ORAL_TABLET | Freq: Three times a day (TID) | ORAL | 0 refills | Status: DC | PRN
  Filled 2023-04-02: qty 15, 5d supply, fill #0

## 2023-04-02 MED ORDER — LACTATED RINGERS IV BOLUS: 500.0000 mL | Freq: Once | INTRAVENOUS | Status: DC

## 2023-04-02 MED ORDER — POTASSIUM CHLORIDE CRYS ER 20 MEQ PO TBCR
40.0000 meq | EXTENDED_RELEASE_TABLET | ORAL | Status: DC
  Administered 2023-04-02: 40 meq via ORAL
  Filled 2023-04-02: qty 2

## 2023-04-02 NOTE — Progress Notes (Signed)
Arrived at room to do EEG.  Patient had a lace front wig glued to entire scalp.  Patient unwilling to remove wig.  Unable to do EEG.  RN notified.

## 2023-04-02 NOTE — Discharge Summary (Signed)
[name] ZOX:[id number] DOB: Oct 26, 1985 DOA: 03/31/2023  PCP: Patient, No Pcp Per  Admit date: 03/31/2023  Discharge date: 04/02/2023  Admitted From: Home   Disposition:  Home   Recommendations for Outpatient Follow-up:   Follow up with PCP in 1-2 weeks  PCP Please obtain BMP/CBC, 2 view CXR in 1week,  (see Discharge instructions)   PCP Please follow up on the following pending results: Monitor CBGs   Home Health: None   Equipment/Devices: None  Consultations: None  Discharge Condition: Stable    CODE STATUS: Full    Diet Recommendation: Heart Healthy Low Carb    Chief Complaint  Patient presents with   Abdominal Pain   Nausea     Brief history of present illness from the day of admission and additional interim summary    37 year old female with past medical history of T1 DM, peripheral neuropathy, noncompliant. Today her approximately 8 AM she developed intractable nausea and vomiting.  Also had intractable abdominal pain, after an episode of emesis she subsequently passed out and was on the floor for about 30 minutes found by the husband and then brought to the ER where she was found to have DKA and admitted.                                                                  Hospital Course   Syncope collapse due to vasovagal response, nausea vomiting.  Sounds vasovagal associated with episode of emesis, echocardiogram stable, blood pressure and hemodynamic stable, stable carotid Dopplers, although chances of seizures were low we did order EEG but patient refused to undergo EEG as she did not want to remove her wig, overall much improved, stable now and eager to go home, much improved eager to go home will be discharged, as needed Phenergan and also provided.   Hypokalemia.  Replaced.    DKA  in a patient with underlying DM type I.  Compliant with insulin however A1c suggests poor outpatient control due to hyperglycemia, DKA has resolved, transition to subcu insulin.  Post discharge follow-up with PCP.   Lab Results  Component Value Date   HGBA1C 12.2 (H) 12/30/2022   CBG (last 3)  Recent Labs    04/01/23 2218 04/02/23 0634 04/02/23 0748  GLUCAP 163* 126* 129*    Discharge diagnosis     Principal Problem:   Type 1 diabetes mellitus with hyperosmolar hyperglycemic state (HHS) (HCC) Active Problems:   Diabetic polyneuropathy associated with type 1 diabetes mellitus (HCC)   Diabetic gastroparesis (HCC)   History of noncompliance with medical treatment   Intractable cyclical vomiting with nausea    Discharge instructions    Discharge Instructions     Discharge instructions   Complete by: As directed    Follow with Primary MD  in 7 days   Get CBC, CMP, 2 view Chest X ray -  checked next visit with your primary MD   Activity: As tolerated with Full fall precautions use walker/cane & assistance as needed  Disposition Home    Diet: Heart Healthy Low Carb  Accuchecks 4 times/day, Once in AM empty stomach and then before each meal. Log in all results and show them to your Prim.MD in 3 days. If any glucose reading is under 80 or above 300 call your Prim MD immidiately. Follow Low glucose instructions for glucose under 80 as instructed.   Special Instructions: If you have smoked or chewed Tobacco  in the last 2 yrs please stop smoking, stop any regular Alcohol  and or any Recreational drug use.  On your next visit with your primary care physician please Get Medicines reviewed and adjusted.  Please request your Prim.MD to go over all Hospital Tests and Procedure/Radiological results at the follow up, please get all Hospital records sent to your Prim MD by signing hospital release before you go home.  If you experience worsening of your admission symptoms,  develop shortness of breath, life threatening emergency, suicidal or homicidal thoughts you must seek medical attention immediately by calling 911 or calling your MD immediately  if symptoms less severe.  You Must read complete instructions/literature along with all the possible adverse reactions/side effects for all the Medicines you take and that have been prescribed to you. Take any new Medicines after you have completely understood and accpet all the possible adverse reactions/side effects.   Increase activity slowly   Complete by: As directed        Discharge Medications   Allergies as of 04/02/2023       Reactions   Pramipexole Shortness Of Breath        Medication List     TAKE these medications    amitriptyline 25 MG tablet Commonly known as: ELAVIL Take 75 mg by mouth at bedtime.   B-D UF III MINI PEN NEEDLES 31G X 5 MM Misc Generic drug: Insulin Pen Needle To use with Insulin pens, as directed   cyanocobalamin 1000 MCG/ML injection Commonly known as: VITAMIN B12 Inject 1,000 mcg into the muscle every 30 (thirty) days.   Dexcom G6 Transmitter Misc Inject 1 each into the skin every 14 (fourteen) days.   DULoxetine 60 MG capsule Commonly known as: CYMBALTA Take 60 mg by mouth daily.   insulin aspart 100 UNIT/ML FlexPen Commonly known as: NOVOLOG Inject 0-18 Units into the skin 3 (three) times daily with meals. Check Blood Glucose (BG) and inject per scale: BG <150= 0 unit; BG 150-200= 3 unit; BG 201-250= 6 unit; BG 251-300= 9 unit; BG 301-350= 12 unit; BG 351-400= 15 unit; BG >400= 18 unit and Call Primary Care.   Insulin Syringes (Disposable) U-100 1 ML Misc Inject 1 each into the skin in the morning and at bedtime.   Levemir FlexTouch 100 UNIT/ML FlexTouch Pen Generic drug: insulin detemir Inject 15 Units into the skin 2 (two) times daily.   metoCLOPramide 10 MG tablet Commonly known as: REGLAN Take 1 tablet (10 mg total) by mouth every 6 (six) hours as  needed for nausea or vomiting.   metoCLOPramide 10 MG tablet Commonly known as: REGLAN Take 1 tablet (10 mg total) by mouth 3 (three) times daily with meals for 14 days.   mirtazapine 15 MG tablet Commonly known as: REMERON Take 15-30 mg by mouth at bedtime.   promethazine  25 MG tablet Commonly known as: PHENERGAN Take 1 tablet (25 mg total) by mouth every 8 (eight) hours as needed for nausea or vomiting.         Follow-up Information     PCP. Schedule an appointment as soon as possible for a visit in 1 week(s).                  Major procedures and Radiology Reports - PLEASE review detailed and final reports thoroughly  -       CT ABDOMEN PELVIS W CONTRAST  Result Date: 04/01/2023 CLINICAL DATA:  Acute abdominal pain with nausea and vomiting EXAM: CT ABDOMEN AND PELVIS WITH CONTRAST TECHNIQUE: Multidetector CT imaging of the abdomen and pelvis was performed using the standard protocol following bolus administration of intravenous contrast. RADIATION DOSE REDUCTION: This exam was performed according to the departmental dose-optimization program which includes automated exposure control, adjustment of the mA and/or kV according to patient size and/or use of iterative reconstruction technique. CONTRAST:  75mL OMNIPAQUE IOHEXOL 350 MG/ML SOLN COMPARISON:  09/27/2022 FINDINGS: Lower chest: No acute abnormality. Hepatobiliary: Diffuse low-density of the liver suspicious for steatosis. No suspicious liver lesions. Hyperdense content of the gallbladder likely combination of sludge and stones. No CT evidence of acute cholecystitis. No bile duct dilatation. Pancreas: Unremarkable. No pancreatic ductal dilatation or surrounding inflammatory changes. Spleen: Normal in size without focal abnormality. Adrenals/Urinary Tract: Adrenal glands are unremarkable. Kidneys are normal, without renal calculi, focal lesion, or hydronephrosis. Unable to evaluate the bladder due to collapse configuration.  Stomach/Bowel: Stomach is within normal limits. Appendix appears normal. No evidence of bowel wall thickening, distention, or inflammatory changes. Vascular/Lymphatic: No significant vascular findings are present. No enlarged abdominal or pelvic lymph nodes. Reproductive: Enlarged heterogeneous uterus most likely due to fibroids. No adnexal abnormality. Other: Focal soft tissue thickening with underlying fat stranding again noted the right anterior abdominal wall. This is most likely a site of medication administration or prior injury. Mild stranding appears to have increased since prior examination. Musculoskeletal: No acute or significant osseous findings. IMPRESSION: 1. No acute abnormality of the abdomen or pelvis. 2. Hyperdense content of the gallbladder likely combination of sludge and stones. No CT evidence of acute cholecystitis. 3. Focal soft tissue thickening with underlying fat stranding again noted the right anterior abdominal wall. This is most likely a site of medication administration or prior injury. The subcutaneous fat stranding appears to have increased since prior examination. Please correlate with physical examination to exclude an enlarging cutaneous mass/lesion. Electronically Signed   By: Acquanetta Belling M.D.   On: 04/01/2023 13:41   VAS US CAROTID  Result Date: 04/01/2023 Carotid Arterial Duplex Study Patient Name:  KAYDENSE SCIALDONE  Date of Exam:   04/01/2023 Medical Rec #: [id number]                 Accession #:    [id number] Date of Birth: November 29, 1985                 Patient Gender: F Patient Age:   72 years Exam Location:  [facility] Procedure:      VAS US CAROTID Referring Phys: DEBBY CROSLEY --------------------------------------------------------------------------------  Indications:       Syncope. Risk Factors:      Diabetes. Comparison Study:  No prior study Performing Technologist: Shona Simpson  Examination Guidelines: A complete evaluation includes B-mode imaging,  spectral Doppler, color Doppler, and power Doppler as needed of all accessible portions of  each vessel. Bilateral testing is considered an integral part of a complete examination. Limited examinations for reoccurring indications may be performed as noted.  Right Carotid Findings: +----------+--------+--------+--------+------------------+------------------+           PSV cm/sEDV cm/sStenosisPlaque DescriptionComments           +----------+--------+--------+--------+------------------+------------------+ CCA Prox  73      9                                                    +----------+--------+--------+--------+------------------+------------------+ CCA Distal90      22                                                   +----------+--------+--------+--------+------------------+------------------+ ICA Prox  95      25                                intimal thickening +----------+--------+--------+--------+------------------+------------------+ ICA Mid   86      35                                                   +----------+--------+--------+--------+------------------+------------------+ ICA Distal76      29                                                   +----------+--------+--------+--------+------------------+------------------+ ECA       134     19                                                   +----------+--------+--------+--------+------------------+------------------+ +----------+--------+-------+--------+-------------------+           PSV cm/sEDV cmsDescribeArm Pressure (mmHG) +----------+--------+-------+--------+-------------------+ [id number]                                        +----------+--------+-------+--------+-------------------+ +---------+--------+--+--------+--+ VertebralPSV cm/s40EDV cm/s16 +---------+--------+--+--------+--+  Left Carotid Findings:  +----------+--------+--------+--------+------------------+------------------+           PSV cm/sEDV cm/sStenosisPlaque DescriptionComments           +----------+--------+--------+--------+------------------+------------------+ CCA Prox  100     14                                                   +----------+--------+--------+--------+------------------+------------------+ CCA Distal99      23                                                   +----------+--------+--------+--------+------------------+------------------+  ICA Prox  80      25                                intimal thickening +----------+--------+--------+--------+------------------+------------------+ ICA Mid   84      34                                                   +----------+--------+--------+--------+------------------+------------------+ ICA Distal87      37                                                   +----------+--------+--------+--------+------------------+------------------+ ECA       118     14                                                   +----------+--------+--------+--------+------------------+------------------+ +----------+--------+--------+--------+-------------------+           PSV cm/sEDV cm/sDescribeArm Pressure (mmHG) +----------+--------+--------+--------+-------------------+ Subclavian171                                         +----------+--------+--------+--------+-------------------+ +---------+--------+--+--------+--+ VertebralPSV cm/s62EDV cm/s26 +---------+--------+--+--------+--+   Summary: Right Carotid: The extracranial vessels were near-normal with only minimal wall                thickening or plaque. Left Carotid: The extracranial vessels were near-normal with only minimal wall               thickening or plaque. Vertebrals:  Bilateral vertebral arteries demonstrate antegrade flow. Subclavians: Normal flow hemodynamics were seen in  bilateral subclavian              arteries. *See table(s) above for measurements and observations.  Electronically signed by Delia Heady MD on 04/01/2023 at 12:11:23 PM.    Final    ECHOCARDIOGRAM COMPLETE  Result Date: 04/01/2023    ECHOCARDIOGRAM REPORT   Patient Name:   SERYNITY DOTTERWEICH Date of Exam: 04/01/2023 Medical Rec #:  [id number]                Height:       66.0 in Accession #:    [id number]               Weight:       175.0 lb Date of Birth:  08-21-1985                BSA:          1.889 m Patient Age:    37 years                 BP:           96/75 mmHg Patient Gender: F                        HR:           115  bpm. Exam Location:  Inpatient Procedure: 2D Echo, Cardiac Doppler and Color Doppler Indications:    Syncope R55  History:        Patient has no prior history of Echocardiogram examinations.                 Risk Factors:Diabetes.  Sonographer:    Lucendia Herrlich Referring Phys: Effie Shy Randell Teare K [facility] IMPRESSIONS  1. Left ventricular ejection fraction, by estimation, is 65 to 70%. The left ventricle has normal function. The left ventricle has no regional wall motion abnormalities. Left ventricular diastolic parameters were normal.  2. Right ventricular systolic function is normal. The right ventricular size is normal. Tricuspid regurgitation signal is inadequate for assessing PA pressure.  3. The mitral valve is normal in structure. No evidence of mitral valve regurgitation. No evidence of mitral stenosis.  4. The aortic valve is tricuspid. Aortic valve regurgitation is not visualized. No aortic stenosis is present.  5. The inferior vena cava is normal in size with greater than 50% respiratory variability, suggesting right atrial pressure of 3 mmHg. Comparison(s): No prior Echocardiogram. Conclusion(s)/Recommendation(s): Normal biventricular function without evidence of hemodynamically significant valvular heart disease. FINDINGS  Left Ventricle: Left ventricular ejection fraction, by  estimation, is 65 to 70%. The left ventricle has normal function. The left ventricle has no regional wall motion abnormalities. The left ventricular internal cavity size was normal in size. There is  no left ventricular hypertrophy. Left ventricular diastolic parameters were normal. Right Ventricle: The right ventricular size is normal. No increase in right ventricular wall thickness. Right ventricular systolic function is normal. Tricuspid regurgitation signal is inadequate for assessing PA pressure. Left Atrium: Left atrial size was normal in size. Right Atrium: Right atrial size was normal in size. Pericardium: Trivial pericardial effusion is present. The pericardial effusion is posterior to the left ventricle. Mitral Valve: The mitral valve is normal in structure. No evidence of mitral valve regurgitation. No evidence of mitral valve stenosis. Tricuspid Valve: The tricuspid valve is normal in structure. Tricuspid valve regurgitation is not demonstrated. No evidence of tricuspid stenosis. Aortic Valve: The aortic valve is tricuspid. Aortic valve regurgitation is not visualized. No aortic stenosis is present. Pulmonic Valve: The pulmonic valve was normal in structure. Pulmonic valve regurgitation is not visualized. No evidence of pulmonic stenosis. Aorta: The aortic root, ascending aorta and aortic arch are all structurally normal, with no evidence of dilitation or obstruction. Venous: The inferior vena cava is normal in size with greater than 50% respiratory variability, suggesting right atrial pressure of 3 mmHg. IAS/Shunts: No atrial level shunt detected by color flow Doppler.  LEFT VENTRICLE PLAX 2D LVIDd:         4.00 cm   Diastology LVIDs:         2.50 cm   LV e' medial:    10.90 cm/s LV PW:         0.90 cm   LV E/e' medial:  8.4 LV IVS:        0.80 cm   LV e' lateral:   15.20 cm/s LVOT diam:     1.90 cm   LV E/e' lateral: 6.0 LV SV:         46 LV SV Index:   24 LVOT Area:     2.84 cm  RIGHT VENTRICLE              IVC RV S prime:     14.30 cm/s  IVC diam: 1.30 cm TAPSE (M-mode): 2.4  cm LEFT ATRIUM             Index        RIGHT ATRIUM           Index LA diam:        2.90 cm 1.53 cm/m   RA Area:     11.40 cm LA Vol (A2C):   22.6 ml 11.96 ml/m  RA Volume:   25.00 ml  13.23 ml/m LA Vol (A4C):   36.1 ml 19.11 ml/m LA Biplane Vol: 31.1 ml 16.46 ml/m  AORTIC VALVE LVOT Vmax:   111.00 cm/s LVOT Vmean:  71.200 cm/s LVOT VTI:    0.163 m  AORTA Ao Root diam: 3.00 cm Ao Asc diam:  3.00 cm MITRAL VALVE                TRICUSPID VALVE MV Area (PHT): 5.54 cm     TR Peak grad:   6.6 mmHg MV Decel Time: 137 msec     TR Vmax:        128.00 cm/s MV E velocity: 91.50 cm/s MV A velocity: 106.00 cm/s  SHUNTS MV E/A ratio:  0.86         Systemic VTI:  0.16 m                             Systemic Diam: 1.90 cm Riley Lam MD Electronically signed by Riley Lam MD Signature Date/Time: 04/01/2023/10:34:21 AM    Final    DG Abd Portable 1V  Result Date: 04/01/2023 CLINICAL DATA:  Upset stomach. EXAM: PORTABLE ABDOMEN - 1 VIEW COMPARISON:  None Available. FINDINGS: No gaseous bowel dilatation. No radio-opaque calculi or other significant radiographic abnormality are seen. IMPRESSION: Negative. CT imaging could be used to further evaluate as clinically warranted. Electronically Signed   By: Kennith Center M.D.   On: 04/01/2023 06:17   DG Chest 1 View  Result Date: 03/31/2023 CLINICAL DATA:  DKA EXAM: CHEST  1 VIEW COMPARISON:  02/02/2023 FINDINGS: Low lung volumes. Lungs are essentially clear. No pleural effusion or pneumothorax. The heart is normal in size. IMPRESSION: No acute cardiopulmonary disease. Electronically Signed   By: Charline Bills M.D.   On: 03/31/2023 18:05    Micro Results    No results found for this or any previous visit (from the past 240 hour(s)).  Today   Subjective    [name] today has no headache,no chest abdominal pain,no new weakness tingling or numbness, feels  much better wants to go home today.    Objective   Blood pressure 106/70, pulse 84, temperature 97.9 F (36.6 C), temperature source Oral, resp. rate 15, height 5\' 6"  (1.676 m), weight 79.4 kg, last menstrual period 04/01/2023, SpO2 94%.  No intake or output data in the 24 hours ending 04/02/23 0941  Exam  Awake Alert, No new F.N deficits,    [place]cupine.AT,PERRAL Supple Neck,   Symmetrical Chest wall movement, Good air movement bilaterally, CTAB RRR,No Gallops,   +ve B.Sounds, Abd Soft, Non tender,  No Cyanosis, Clubbing or edema    Data Review   Recent Labs  Lab 03/31/23 1620 03/31/23 1632 03/31/23 1633 03/31/23 2136 04/01/23 0554 04/02/23 0205  WBC 9.0  --   --  11.6* 10.6* 8.0  HGB 13.2 13.6 13.9 11.9* 11.7* 10.7*  HCT 38.4 40.0 41.0 35.6* 35.0* 31.8*  PLT 329  --   --  294 302 286  MCV  88.9  --   --  89.0 89.5 88.6  MCH 30.6  --   --  29.8 29.9 29.8  MCHC 34.4  --   --  33.4 33.4 33.6  RDW 12.9  --   --  12.9 13.2 13.3  LYMPHSABS 1.3  --   --   --   --  3.5  MONOABS 0.2  --   --   --   --  0.6  EOSABS 0.0  --   --   --   --  0.1  BASOSABS 0.0  --   --   --   --  0.0    Recent Labs  Lab 03/31/23 1539 03/31/23 1620 03/31/23 1632 03/31/23 1633 03/31/23 2136 04/01/23 0032 04/01/23 0554 04/01/23 0652 04/01/23 0930 04/02/23 0205  NA  --  131* 133* 132*  --   --  136  --   --  135  K  --  4.2 4.2 4.3  --   --  3.5  --   --  3.0*  CL  --  93*  --  98  --   --  99  --   --  100  CO2  --  20*  --   --   --   --  23  --   --  24  ANIONGAP  --  18*  --   --   --   --  14  --   --  11  GLUCOSE  --  484*  --  501*  --   --  159*  --   --  171*  BUN  --  9  --  9  --   --  10  --   --  5*  CREATININE  --  0.95  --  0.70 1.03*  --  0.84  --   --  0.75  AST  --  15  --   --   --   --   --   --   --   --   ALT  --  16  --   --   --   --   --   --   --   --   ALKPHOS  --  98  --   --   --   --   --   --   --   --   BILITOT  --  0.7  --   --   --   --   --   --   --   --    ALBUMIN  --  3.9  --   --   --   --   --   --   --   --   LATICACIDVEN  --   --   --  2.7* 3.6* 2.5*  --  1.4 1.1  --   TSH  --   --   --   --   --   --  0.932  --   --   --   BNP  --   --   --   --   --   --   --   --   --  11.2  MG 1.9  --   --   --   --   --  1.9  --   --  1.9  CALCIUM  --  9.5  --   --   --   --  9.1  --   --  8.5*    Total Time in preparing paper work, data evaluation and todays exam - 35 minutes  Signature  -    Susa Raring M.D on 04/02/2023 at 9:41 AM   -  To page go to [email or web address]

## 2023-04-02 NOTE — TOC Transition Note (Signed)
Transition of Care [facility]) - CM/SW Discharge Note   Patient Details  Name: [name] MRN: [id number] Date of Birth: 03-Oct-1985  Transition of Care [facility]) CM/SW Contact:  Gordy Clement, RN Phone Number: 04/02/2023, 9:26 AM   Clinical Narrative:     Patient will DC to home today. Husband to transport. No TOC needs identified  Patient states her PCP is Dr Lorin Picket on [address] [facility]            Patient Goals and CMS Choice      Discharge Placement                         Discharge Plan and Services Additional resources added to the After Visit Summary for                                       Social Determinants of Health (SDOH) Interventions SDOH Screenings   Food Insecurity: No Food Insecurity (03/31/2023)  Housing: Low Risk  (03/31/2023)  Transportation Needs: No Transportation Needs (03/31/2023)  Utilities: Not At Risk (03/31/2023)  Depression (PHQ2-9): Low Risk  (11/08/2021)  Social Connections: Unknown (01/30/2022)   Received from [facility]  Tobacco Use: Low Risk  (03/31/2023)     Readmission Risk Interventions    02/04/2023   11:55 AM  Readmission Risk Prevention Plan  Transportation Screening Complete  Medication Review (RN Care Manager) Complete  PCP or Specialist appointment within 3-5 days of discharge Complete  HRI or Home Care Consult Complete

## 2023-04-02 NOTE — Discharge Instructions (Addendum)
 Follow with Primary MD  in 7 days   Get CBC, CMP, 2 view Chest X ray -  checked next visit with your primary MD    Activity: As tolerated with Full fall precautions use walker/cane & assistance as needed  Disposition Home    Diet: Heart Healthy Low Carb  Accuchecks 4 times/day, Once in AM empty stomach and then before each meal. Log in all results and show them to your Prim.MD in 3 days. If any glucose reading is under 80 or above 300 call your Prim MD immidiately. Follow Low glucose instructions for glucose under 80 as instructed.   Special Instructions: If you have smoked or chewed Tobacco  in the last 2 yrs please stop smoking, stop any regular Alcohol  and or any Recreational drug use.  On your next visit with your primary care physician please Get Medicines reviewed and adjusted.  Please request your Prim.MD to go over all Hospital Tests and Procedure/Radiological results at the follow up, please get all Hospital records sent to your Prim MD by signing hospital release before you go home.  If you experience worsening of your admission symptoms, develop shortness of breath, life threatening emergency, suicidal or homicidal thoughts you must seek medical attention immediately by calling 911 or calling your MD immediately  if symptoms less severe.  You Must read complete instructions/literature along with all the possible adverse reactions/side effects for all the Medicines you take and that have been prescribed to you. Take any new Medicines after you have completely understood and accpet all the possible adverse reactions/side effects.

## 2023-04-02 NOTE — Plan of Care (Signed)

## 2023-04-04 ENCOUNTER — Encounter (HOSPITAL_COMMUNITY): Payer: Self-pay

## 2023-04-04 ENCOUNTER — Emergency Department (HOSPITAL_COMMUNITY): Payer: BC Managed Care – PPO

## 2023-04-04 ENCOUNTER — Observation Stay (HOSPITAL_COMMUNITY)
Admission: EM | Admit: 2023-04-04 | Discharge: 2023-04-05 | Disposition: A | Discharge status: Home / Self Care | Payer: BC Managed Care – PPO | Attending: Emergency Medicine | Admitting: Emergency Medicine

## 2023-04-04 DIAGNOSIS — Z794 Long term (current) use of insulin: Secondary | ICD-10-CM | POA: Diagnosis not present

## 2023-04-04 DIAGNOSIS — E1065 Type 1 diabetes mellitus with hyperglycemia: Secondary | ICD-10-CM | POA: Diagnosis not present

## 2023-04-04 DIAGNOSIS — R112 Nausea with vomiting, unspecified: Secondary | ICD-10-CM | POA: Diagnosis present

## 2023-04-04 DIAGNOSIS — E1010 Type 1 diabetes mellitus with ketoacidosis without coma: Secondary | ICD-10-CM

## 2023-04-04 DIAGNOSIS — K760 Fatty (change of) liver, not elsewhere classified: Secondary | ICD-10-CM | POA: Diagnosis present

## 2023-04-04 DIAGNOSIS — R7401 Elevation of levels of liver transaminase levels: Secondary | ICD-10-CM | POA: Diagnosis present

## 2023-04-04 DIAGNOSIS — Z79899 Other long term (current) drug therapy: Secondary | ICD-10-CM | POA: Diagnosis not present

## 2023-04-04 DIAGNOSIS — E1110 Type 2 diabetes mellitus with ketoacidosis without coma: Secondary | ICD-10-CM | POA: Diagnosis present

## 2023-04-04 DIAGNOSIS — K8020 Calculus of gallbladder without cholecystitis without obstruction: Secondary | ICD-10-CM | POA: Diagnosis present

## 2023-04-04 LAB — CBC
HCT: 33.7 % — ABNORMAL LOW (ref 36.0–46.0)
Hemoglobin: 11.6 g/dL — ABNORMAL LOW (ref 12.0–15.0)
MCH: 30.5 pg (ref 26.0–34.0)
MCHC: 34.4 g/dL (ref 30.0–36.0)
MCV: 88.7 fL (ref 80.0–100.0)
Platelets: 297 10*3/uL (ref 150–400)
RBC: 3.8 MIL/uL — ABNORMAL LOW (ref 3.87–5.11)
RDW: 12.8 % (ref 11.5–15.5)
WBC: 7.8 10*3/uL (ref 4.0–10.5)
nRBC: 0 % (ref 0.0–0.2)

## 2023-04-04 LAB — I-STAT VENOUS BLOOD GAS, ED
Acid-Base Excess: 4 mmol/L — ABNORMAL HIGH (ref 0.0–2.0)
Bicarbonate: 29.1 mmol/L — ABNORMAL HIGH (ref 20.0–28.0)
Calcium, Ion: 1.15 mmol/L (ref 1.15–1.40)
HCT: 40 % (ref 36.0–46.0)
Hemoglobin: 13.6 g/dL (ref 12.0–15.0)
O2 Saturation: 62 %
Potassium: 4.3 mmol/L (ref 3.5–5.1)
Sodium: 135 mmol/L (ref 135–145)
TCO2: 30 mmol/L (ref 22–32)
pCO2, Ven: 43.4 mmHg — ABNORMAL LOW (ref 44–60)
pH, Ven: 7.434 — ABNORMAL HIGH (ref 7.25–7.43)
pO2, Ven: 31 mmHg — CL (ref 32–45)

## 2023-04-04 LAB — CBG MONITORING, ED
Glucose-Capillary: 352 mg/dL — ABNORMAL HIGH (ref 70–99)
Glucose-Capillary: 369 mg/dL — ABNORMAL HIGH (ref 70–99)
Glucose-Capillary: 300 mg/dL — ABNORMAL HIGH (ref 70–99)

## 2023-04-04 LAB — URINALYSIS, ROUTINE W REFLEX MICROSCOPIC
Bilirubin Urine: NEGATIVE
Glucose, UA: >=500 mg/dL — AB
Ketones, ur: 20 mg/dL — AB
Leukocytes,Ua: NEGATIVE
Nitrite: NEGATIVE
Protein, ur: 30 mg/dL — AB
RBC / HPF: >50 RBC/hpf (ref 0–5)
Specific Gravity, Urine: 1.021 (ref 1.005–1.030)
pH: 8 (ref 5.0–8.0)

## 2023-04-04 LAB — CBC WITH DIFFERENTIAL/PLATELET
Abs Immature Granulocytes: 0.05 10*3/uL (ref 0.00–0.07)
Basophils Absolute: 0 10*3/uL (ref 0.0–0.1)
Basophils Relative: 0 %
Eosinophils Absolute: 0 10*3/uL (ref 0.0–0.5)
Eosinophils Relative: 1 %
HCT: 37.3 % (ref 36.0–46.0)
Hemoglobin: 13 g/dL (ref 12.0–15.0)
Immature Granulocytes: 1 %
Lymphocytes Relative: 20 %
Lymphs Abs: 1.4 10*3/uL (ref 0.7–4.0)
MCH: 30.7 pg (ref 26.0–34.0)
MCHC: 34.9 g/dL (ref 30.0–36.0)
MCV: 88 fL (ref 80.0–100.0)
Monocytes Absolute: 0.2 10*3/uL (ref 0.1–1.0)
Monocytes Relative: 4 %
Neutro Abs: 4.9 10*3/uL (ref 1.7–7.7)
Neutrophils Relative %: 74 %
Platelets: 327 10*3/uL (ref 150–400)
RBC: 4.24 MIL/uL (ref 3.87–5.11)
RDW: 12.5 % (ref 11.5–15.5)
WBC: 6.6 10*3/uL (ref 4.0–10.5)
nRBC: 0 % (ref 0.0–0.2)

## 2023-04-04 LAB — COMPREHENSIVE METABOLIC PANEL
ALT: 344 U/L — ABNORMAL HIGH (ref 0–44)
AST: 362 U/L — ABNORMAL HIGH (ref 15–41)
Albumin: 3.5 g/dL (ref 3.5–5.0)
Alkaline Phosphatase: 272 U/L — ABNORMAL HIGH (ref 38–126)
Anion gap: 15 (ref 5–15)
BUN: 5 mg/dL — ABNORMAL LOW (ref 6–20)
CO2: 26 mmol/L (ref 22–32)
Calcium: 9.3 mg/dL (ref 8.9–10.3)
Chloride: 93 mmol/L — ABNORMAL LOW (ref 98–111)
Creatinine, Ser: 0.84 mg/dL (ref 0.44–1.00)
GFR, Estimated: >60 mL/min (ref >60)
Glucose, Bld: 405 mg/dL — ABNORMAL HIGH (ref 70–99)
Potassium: 4.2 mmol/L (ref 3.5–5.1)
Sodium: 134 mmol/L — ABNORMAL LOW (ref 135–145)
Total Bilirubin: 1.1 mg/dL (ref 0.3–1.2)
Total Protein: 7.2 g/dL (ref 6.5–8.1)

## 2023-04-04 LAB — GLUCOSE, CAPILLARY
Glucose-Capillary: 138 mg/dL — ABNORMAL HIGH (ref 70–99)
Glucose-Capillary: 164 mg/dL — ABNORMAL HIGH (ref 70–99)
Glucose-Capillary: 167 mg/dL — ABNORMAL HIGH (ref 70–99)
Glucose-Capillary: 187 mg/dL — ABNORMAL HIGH (ref 70–99)
Glucose-Capillary: 187 mg/dL — ABNORMAL HIGH (ref 70–99)
Glucose-Capillary: 252 mg/dL — ABNORMAL HIGH (ref 70–99)

## 2023-04-04 LAB — HCG, QUANTITATIVE, PREGNANCY: hCG, Beta Chain, Quant, S: <1 m[IU]/mL (ref <5)

## 2023-04-04 LAB — HEPATITIS PANEL, ACUTE
HCV Ab: NONREACTIVE
Hep A IgM: NONREACTIVE
Hep B C IgM: NONREACTIVE
Hepatitis B Surface Ag: NONREACTIVE

## 2023-04-04 LAB — LIPASE, BLOOD: Lipase: 21 U/L (ref 11–51)

## 2023-04-04 LAB — MRSA NEXT GEN BY PCR, NASAL: MRSA by PCR Next Gen: DETECTED — AB

## 2023-04-04 LAB — BETA-HYDROXYBUTYRIC ACID
Beta-Hydroxybutyric Acid: 2.11 mmol/L — ABNORMAL HIGH (ref 0.05–0.27)
Beta-Hydroxybutyric Acid: 0.15 mmol/L (ref 0.05–0.27)

## 2023-04-04 MED ORDER — PANTOPRAZOLE SODIUM 40 MG IV SOLR
40.0000 mg | INTRAVENOUS | Status: DC
  Administered 2023-04-04: 40 mg via INTRAVENOUS
  Filled 2023-04-04: qty 10

## 2023-04-04 MED ORDER — HYDROMORPHONE HCL 1 MG/ML IJ SOLN
0.5000 mg | Freq: Once | INTRAMUSCULAR | Status: AC
  Administered 2023-04-04: 0.5 mg via INTRAVENOUS
  Filled 2023-04-04: qty 1

## 2023-04-04 MED ORDER — INSULIN REGULAR(HUMAN) IN NACL 100-0.9 UT/100ML-% IV SOLN
INTRAVENOUS | Status: DC
  Administered 2023-04-04: 12 [IU]/h via INTRAVENOUS
  Filled 2023-04-04: qty 100

## 2023-04-04 MED ORDER — HYDROMORPHONE HCL 1 MG/ML IJ SOLN
0.5000 mg | INTRAMUSCULAR | Status: DC | PRN
  Administered 2023-04-04 – 2023-04-05 (×3): 0.5 mg via INTRAVENOUS
  Filled 2023-04-04 (×3): qty 0.5

## 2023-04-04 MED ORDER — CHLORHEXIDINE GLUCONATE CLOTH 2 % EX PADS
6.0000 | MEDICATED_PAD | Freq: Once | CUTANEOUS | Status: AC
  Administered 2023-04-04: 6 via TOPICAL

## 2023-04-04 MED ORDER — CHLORHEXIDINE GLUCONATE CLOTH 2 % EX PADS
6.0000 | MEDICATED_PAD | Freq: Every day | CUTANEOUS | Status: DC
  Administered 2023-04-05: 6 via TOPICAL

## 2023-04-04 MED ORDER — LACTATED RINGERS IV SOLN: INTRAVENOUS | Status: DC

## 2023-04-04 MED ORDER — DEXTROSE 50 % IV SOLN: 0.0000 mL | INTRAVENOUS | Status: DC | PRN

## 2023-04-04 MED ORDER — AMITRIPTYLINE HCL 50 MG PO TABS
75.0000 mg | ORAL_TABLET | Freq: Every day | ORAL | Status: DC
  Filled 2023-04-04 (×2): qty 1.5
  Filled 2023-04-04 (×2): qty 1

## 2023-04-04 MED ORDER — ACETAMINOPHEN 650 MG RE SUPP: 650.0000 mg | Freq: Four times a day (QID) | RECTAL | Status: DC | PRN

## 2023-04-04 MED ORDER — DEXTROSE IN LACTATED RINGERS 5 % IV SOLN: INTRAVENOUS | Status: DC

## 2023-04-04 MED ORDER — DULOXETINE HCL 60 MG PO CPEP
60.0000 mg | ORAL_CAPSULE | Freq: Every day | ORAL | Status: DC
  Administered 2023-04-05: 60 mg via ORAL
  Filled 2023-04-04: qty 1

## 2023-04-04 MED ORDER — MUPIROCIN 2 % EX OINT
1.0000 | TOPICAL_OINTMENT | Freq: Two times a day (BID) | CUTANEOUS | Status: DC
  Administered 2023-04-05 (×2): 1 via NASAL
  Filled 2023-04-04: qty 22

## 2023-04-04 MED ORDER — LACTATED RINGERS IV BOLUS
1000.0000 mL | Freq: Once | INTRAVENOUS | Status: AC
  Administered 2023-04-04: 1000 mL via INTRAVENOUS

## 2023-04-04 MED ORDER — METOCLOPRAMIDE HCL 5 MG/ML IJ SOLN
10.0000 mg | Freq: Once | INTRAMUSCULAR | Status: AC
  Administered 2023-04-04: 10 mg via INTRAVENOUS
  Filled 2023-04-04: qty 2

## 2023-04-04 MED ORDER — PROCHLORPERAZINE EDISYLATE 10 MG/2ML IJ SOLN
10.0000 mg | Freq: Four times a day (QID) | INTRAMUSCULAR | Status: DC | PRN
  Administered 2023-04-04 (×2): 10 mg via INTRAVENOUS
  Filled 2023-04-04 (×3): qty 2

## 2023-04-04 MED ORDER — HALOPERIDOL LACTATE 5 MG/ML IJ SOLN
2.0000 mg | Freq: Once | INTRAMUSCULAR | Status: AC
  Administered 2023-04-04: 2 mg via INTRAVENOUS
  Filled 2023-04-04: qty 1

## 2023-04-04 MED ORDER — LACTATED RINGERS IV BOLUS: 20.0000 mL/kg | Freq: Once | INTRAVENOUS | Status: DC

## 2023-04-04 MED ORDER — ENOXAPARIN SODIUM 40 MG/0.4ML IJ SOSY: 40.0000 mg | PREFILLED_SYRINGE | INTRAMUSCULAR | Status: DC

## 2023-04-04 MED ORDER — ACETAMINOPHEN 325 MG PO TABS: 650.0000 mg | ORAL_TABLET | Freq: Four times a day (QID) | ORAL | Status: DC | PRN

## 2023-04-04 NOTE — ED Notes (Signed)
ED TO INPATIENT HANDOFF REPORT  ED Nurse Name and Phone #:   S Name/Age/Gender [name] 37 y.o. female Room/Bed: 024C/024C  Code Status   Code Status: Full Code  Home/SNF/Other Home Patient oriented to: self, place, time, and situation Is this baseline? Yes   Triage Complete: Triage complete  Chief Complaint DKA (diabetic ketoacidosis) (HCC) [E11.10]  Triage Note Pt BIB [facility] from home with c/o hyperglycemia. Discharged earlier this week for DKA. Woke up this morning feeling nauseous, vomited several times, unable to keep anything down. 4 mg Zofran given IM.  192/100 HR 110 CG 446   Allergies Allergies  Allergen Reactions   Mirapex [Pramipexole] Shortness Of Breath    Level of Care/Admitting Diagnosis ED Disposition     ED Disposition  Admit   Condition  --   Comment  Hospital [facility]: [facility] [[id number]]  Level of Care: Progressive [102]  Admit to Progressive based on following criteria: GI, ENDOCRINE disease patients with GI bleeding, acute liver failure or pancreatitis, stable with diabetic ketoacidosis or thyrotoxicosis (hypothyroid) state.  May place patient in observation at [facility] or [facility] if equivalent level of care is available:: No  Covid Evaluation: Asymptomatic - no recent exposure (last 10 days) testing not required  Diagnosis: DKA (diabetic ketoacidosis) [facility]) [[id number]]  Admitting Physician: Bobette Mo [[id number]]  Attending Physician: Bobette Mo [[id number]]          B Medical/Surgery History Past Medical History:  Diagnosis Date   Chronic constipation 11/08/2021   Diabetic neuropathy (HCC)    History of chicken pox    History of noncompliance with medical treatment 09/07/2022   Type 1 diabetes (HCC)    Vaginal odor 11/08/2021   Past Surgical History:  Procedure Laterality Date   CESAREAN SECTION     only one C/S per pt.   CO2 LASER APPLICATION N/A 02/22/2022   Procedure: CO2 LASER  APPLICATION TO VULVA;  Surgeon: Carver Fila, MD;  Location: [facility];  Service: General;  Laterality: N/A;   DILATION AND CURETTAGE OF UTERUS     EXCISION OF SKIN TAG N/A 02/22/2022   Procedure: EXCISION OF ANAL SKIN TAGS;  Surgeon: Romie Levee, MD;  Location: [facility];  Service: General;  Laterality: N/A;     A IV Location/Drains/Wounds Patient Lines/Drains/Airways Status     Active Line/Drains/Airways     Name Placement date Placement time Site Days   Peripheral IV 04/04/23 20 G Right Forearm 04/04/23  1422  Forearm  less than 1            Intake/Output Last 24 hours No intake or output data in the 24 hours ending 04/04/23 1641  Labs/Imaging Results for orders placed or performed during the hospital encounter of 04/04/23 (from the past 48 hour(s))  Urinalysis, Routine w reflex microscopic -Urine, Clean Catch     Status: Abnormal   Collection Time: 04/04/23  1:22 PM  Result Value Ref Range   Color, Urine YELLOW YELLOW   APPearance CLEAR CLEAR   Specific Gravity, Urine 1.021 1.005 - 1.030   pH 8.0 5.0 - 8.0   Glucose, UA >=500 (A) NEGATIVE mg/dL   Hgb urine dipstick LARGE (A) NEGATIVE   Bilirubin Urine NEGATIVE NEGATIVE   Ketones, ur 20 (A) NEGATIVE mg/dL   Protein, ur 30 (A) NEGATIVE mg/dL   Nitrite NEGATIVE NEGATIVE   Leukocytes,Ua NEGATIVE NEGATIVE   RBC / HPF >50 0 - 5 RBC/hpf  WBC, UA 0-5 0 - 5 WBC/hpf   Bacteria, UA RARE (A) NONE SEEN   Squamous Epithelial / HPF 6-10 0 - 5 /HPF    Comment: Performed at [facility], [address] [place], [place] [address]  CBG monitoring, ED     Status: Abnormal   Collection Time: 04/04/23  1:30 PM  Result Value Ref Range   Glucose-Capillary 352 (H) 70 - 99 mg/dL    Comment: Glucose reference range applies only to samples taken after fasting for at least 8 hours.  hCG, quantitative, pregnancy     Status: None   Collection Time: 04/04/23  1:35 PM  Result Value Ref Range    hCG, Beta Chain, Quant, S <1 <5 mIU/mL    Comment:          GEST. AGE      CONC.  (mIU/mL)   <=1 WEEK        5 - 50     2 WEEKS       50 - 500     3 WEEKS       100 - 10,000     4 WEEKS     1,000 - 30,000     5 WEEKS     3,500 - 115,000   6-8 WEEKS     12,000 - 270,000    12 WEEKS     15,000 - 220,000        FEMALE AND NON-PREGNANT FEMALE:     LESS THAN 5 mIU/mL Performed at [facility], [address] [place], [place] [address]   CBC with Differential     Status: None   Collection Time: 04/04/23  1:35 PM  Result Value Ref Range   WBC 6.6 4.0 - 10.5 K/uL   RBC 4.24 3.87 - 5.11 MIL/uL   Hemoglobin 13.0 12.0 - 15.0 g/dL   HCT 82.9 56.2 - 13.0 %   MCV 88.0 80.0 - 100.0 fL   MCH 30.7 26.0 - 34.0 pg   MCHC 34.9 30.0 - 36.0 g/dL   RDW 86.5 78.4 - 69.6 %   Platelets 327 150 - 400 K/uL   nRBC 0.0 0.0 - 0.2 %   Neutrophils Relative % 74 %   Neutro Abs 4.9 1.7 - 7.7 K/uL   Lymphocytes Relative 20 %   Lymphs Abs 1.4 0.7 - 4.0 K/uL   Monocytes Relative 4 %   Monocytes Absolute 0.2 0.1 - 1.0 K/uL   Eosinophils Relative 1 %   Eosinophils Absolute 0.0 0.0 - 0.5 K/uL   Basophils Relative 0 %   Basophils Absolute 0.0 0.0 - 0.1 K/uL   Immature Granulocytes 1 %   Abs Immature Granulocytes 0.05 0.00 - 0.07 K/uL    Comment: Performed at [facility], [address] [place], [place] [address]  Comprehensive metabolic panel     Status: Abnormal   Collection Time: 04/04/23  1:35 PM  Result Value Ref Range   Sodium 134 (L) 135 - 145 mmol/L   Potassium 4.2 3.5 - 5.1 mmol/L   Chloride 93 (L) 98 - 111 mmol/L   CO2 26 22 - 32 mmol/L   Glucose, Bld 405 (H) 70 - 99 mg/dL    Comment: Glucose reference range applies only to samples taken after fasting for at least 8 hours.   BUN 5 (L) 6 - 20 mg/dL   Creatinine, Ser 4.13 0.44 - 1.00 mg/dL   Calcium 9.3 8.9 - 10.3  mg/dL   Total Protein 7.2 6.5 - 8.1 g/dL   Albumin 3.5 3.5 - 5.0 g/dL   AST 540 (H) 15 - 41 U/L   ALT 344 (H) 0 - 44 U/L    Alkaline Phosphatase 272 (H) 38 - 126 U/L   Total Bilirubin 1.1 0.3 - 1.2 mg/dL   GFR, Estimated >98 >11 mL/min    Comment: (NOTE) Calculated using the CKD-EPI Creatinine Equation (2021)    Anion gap 15 5 - 15    Comment: Performed at [facility], [address] [place], [place] [address]  Lipase, blood     Status: None   Collection Time: 04/04/23  1:35 PM  Result Value Ref Range   Lipase 21 11 - 51 U/L    Comment: Performed at [facility], [address], [place], [place] [address]  Beta-hydroxybutyric acid     Status: Abnormal   Collection Time: 04/04/23  1:35 PM  Result Value Ref Range   Beta-Hydroxybutyric Acid 2.11 (H) 0.05 - 0.27 mmol/L    Comment: Performed at [facility], [address] [place], [place] [address]  I-Stat Venous Blood Gas, ED     Status: Abnormal   Collection Time: 04/04/23  2:04 PM  Result Value Ref Range   pH, Ven 7.434 (H) 7.25 - 7.43   pCO2, Ven 43.4 (L) 44 - 60 mmHg   pO2, Ven 31 (LL) 32 - 45 mmHg   Bicarbonate 29.1 (H) 20.0 - 28.0 mmol/L   TCO2 30 22 - 32 mmol/L   O2 Saturation 62 %   Acid-Base Excess 4.0 (H) 0.0 - 2.0 mmol/L   Sodium 135 135 - 145 mmol/L   Potassium 4.3 3.5 - 5.1 mmol/L   Calcium, Ion 1.15 1.15 - 1.40 mmol/L   HCT 40.0 36.0 - 46.0 %   Hemoglobin 13.6 12.0 - 15.0 g/dL   Sample type VENOUS    Comment NOTIFIED PHYSICIAN   CBG monitoring, ED     Status: Abnormal   Collection Time: 04/04/23  4:19 PM  Result Value Ref Range   Glucose-Capillary 369 (H) 70 - 99 mg/dL    Comment: Glucose reference range applies only to samples taken after fasting for at least 8 hours.   US Abdomen Limited RUQ (LIVER/GB)  Result Date: 04/04/2023 CLINICAL DATA:  Abdominal pain EXAM: ULTRASOUND ABDOMEN LIMITED RIGHT UPPER QUADRANT COMPARISON:  CT abdomen pelvis 04/01/2023 FINDINGS: Gallbladder: Gallstones: Present Sludge: Present Gallbladder Wall: Within normal limits Pericholecystic fluid: None Sonographic Murphy's Sign:  Negative per technologist Common bile duct: Diameter: 3 mm Liver: Parenchymal echogenicity: Increased Contours: Normal Lesions: None Portal vein: Patent.  Hepatopetal flow Other: None. IMPRESSION: 1. Cholelithiasis without additional sonographic evidence of acute cholecystitis. 2. Diffuse increased echogenicity of the hepatic parenchyma is a nonspecific indicator of hepatocellular dysfunction, most commonly steatosis. Electronically Signed   By: Acquanetta Belling M.D.   On: 04/04/2023 16:29    Pending Labs Unresulted Labs (From admission, onward)     Start     Ordered   04/05/23 0500  HIV Antibody (routine testing w rflx)  (HIV Antibody (Routine testing w reflex) panel)  Tomorrow morning,   R        04/04/23 1547   04/05/23 0500  CBC  Tomorrow morning,   R        04/04/23 1547   04/05/23 0500  Comprehensive metabolic panel  Tomorrow morning,   R        04/04/23 1547  04/04/23 1530  Hepatitis panel, acute  Once,   URGENT        04/04/23 1530   04/04/23 1335  Beta-hydroxybutyric acid  (Diabetes Ketoacidosis (DKA))  Now then every 8 hours,   STAT      04/04/23 1336            Vitals/Pain Today's Vitals   04/04/23 1330 04/04/23 1545 04/04/23 1615 04/04/23 1630  BP:  (!) 176/96 (!) 131/119 (!) 153/93  Pulse:  (!) 112 (!) 111 (!) 107  Resp:  15 19 14   Temp:      TempSrc:      SpO2:  100% 98% 100%  PainSc: 10-Worst pain ever       Isolation Precautions No active isolations  Medications Medications  insulin regular, human (MYXREDLIN) 100 units/ 100 mL infusion (12 Units/hr Intravenous New Bag/Given 04/04/23 1636)  dextrose 5 % in lactated ringers infusion (has no administration in time range)  dextrose 50 % solution 0-50 mL (has no administration in time range)  lactated ringers infusion (has no administration in time range)  enoxaparin (LOVENOX) injection 40 mg (has no administration in time range)  acetaminophen (TYLENOL) tablet 650 mg (has no administration in time range)    Or   acetaminophen (TYLENOL) suppository 650 mg (has no administration in time range)  prochlorperazine (COMPAZINE) injection 10 mg (has no administration in time range)  lactated ringers bolus 1,000 mL (1,000 mLs Intravenous Bolus 04/04/23 1422)  haloperidol lactate (HALDOL) injection 2 mg (2 mg Intravenous Given 04/04/23 1427)  lactated ringers bolus 1,000 mL (1,000 mLs Intravenous Bolus 04/04/23 1620)  HYDROmorphone (DILAUDID) injection 0.5 mg (0.5 mg Intravenous Given 04/04/23 1623)  metoCLOPramide (REGLAN) injection 10 mg (10 mg Intravenous Given 04/04/23 1621)    Mobility walks     Focused Assessments Renal Assessment Handoff:    R Recommendations: See Admitting Provider Note  Report given to:   Additional Notes: MD just saw the pt from what received in report the patient was recently d/c fro DKA and states she has been taking her insulin as she should and does not understand what I shappening.  Just started endo tool insulin at   Units. LR bolus going.  IV was an [place] IV dilaudid given and reglan for her vomiting given

## 2023-04-04 NOTE — ED Notes (Signed)
Next glucose due 1850

## 2023-04-04 NOTE — H&P (Signed)
History and Physical    Patient: [name] OZH:[id number] DOB: 1985-11-16 DOA: 04/04/2023 DOS: the patient was seen and examined on 04/04/2023 PCP: Patient, No Pcp Per  Patient coming from: Home  Chief Complaint: No chief complaint on file.  HPI: [name] is a 37 y.o. female with medical history significant of chronic constipation, diabetic neuropathy, varicella-zoster, noncompliance to medical treatment, type 1 diabetes who was recently admitted and discharged due to DKA from 03/31/2023 until 04/02/2023 who is returning to the emergency department due to abdominal pain associated with nausea, multiple episodes of emesis and hyperglycemia.  She stated that her insulin is 12 and refrigerated.  She has to use insulin as instructed.  She denied fever, chills, rhinorrhea, sore throat, wheezing or hemoptysis.  No chest pain, palpitations, diaphoresis, PND, orthopnea or pitting edema of the lower extremities.  No abdominal pain, nausea, emesis, diarrhea, constipation, melena or hematochezia.  No flank pain, dysuria, frequency or hematuria.  No polyuria, polydipsia, polyphagia or blurred vision.   Lab work: Urine analysis showed greater than 500 glucose, ketones of 20 and protein of 30 mg/dL.  Greater than 50 RBC and rare bacteria on microscopic examination.  The patient stated she is currently menstruating.  Bed hydroxybutyric acid 2.11 mmol/L.  Her CBC and lipase were normal.  I-STAT venous blood gas with a pH 7.43, pCO2 43.4 and pO2 of 31 mmol/L.  CMP showed a glucose of 405 mg/dL, AST 629 ALT 528 and alkaline phosphatase 273 units/L.  The rest of the LFTs, renal function and electrolytes are unremarkable after sodium correction.  Imaging: RUQ ultrasound showing cholelithiasis without additional sonographic evidence of acute cholecystitis.  Diffuse increased echogenicity of the hepatic parenchyma is a nonspecific indicator or hepatocellular dysfunction, most commonly a  steatosis.  ED course: Initial vital signs were temperature 98.2 F, pulse 92, respiration 20, BP 170/109 mmHg O2 sat 100% on room air.  The patient received haloperidol 2 mg IVP, hydromorphone 0.5 mg IVP, LR with 2000 mm bolus and metoclopramide 10 mg IVP x 1.   Review of Systems: As mentioned in the history of present illness. All other systems reviewed and are negative.  Past Medical History:  Diagnosis Date   Chronic constipation 11/08/2021   Diabetic neuropathy (HCC)    History of chicken pox    History of noncompliance with medical treatment 09/07/2022   Type 1 diabetes (HCC)    Vaginal odor 11/08/2021   Past Surgical History:  Procedure Laterality Date   CESAREAN SECTION     only one C/S per pt.   CO2 LASER APPLICATION N/A 02/22/2022   Procedure: CO2 LASER APPLICATION TO VULVA;  Surgeon: Carver Fila, MD;  Location: [facility];  Service: General;  Laterality: N/A;   DILATION AND CURETTAGE OF UTERUS     EXCISION OF SKIN TAG N/A 02/22/2022   Procedure: EXCISION OF ANAL SKIN TAGS;  Surgeon: Romie Levee, MD;  Location: [facility];  Service: General;  Laterality: N/A;   Social History:  reports that she has never smoked. She has never been exposed to tobacco smoke. She has never used smokeless tobacco. She reports that she does not drink alcohol and does not use drugs.  Allergies  Allergen Reactions   Pramipexole Shortness Of Breath    Family History  Problem Relation Age of Onset   Hypertension Mother    Glaucoma Mother    Diabetes Maternal Grandmother    Neuropathy Neg Hx  Colon cancer Neg Hx    Stomach cancer Neg Hx    Esophageal cancer Neg Hx    Colon polyps Neg Hx    Rectal cancer Neg Hx     Prior to Admission medications   Medication Sig Start Date End Date Taking? Authorizing Provider  amitriptyline (ELAVIL) 25 MG tablet Take 75 mg by mouth at bedtime. 06/26/22   [provider]  Continuous Blood Gluc Transmit  (DEXCOM G6 TRANSMITTER) MISC Inject 1 each into the skin every 14 (fourteen) days. 12/31/21   Dulce Sellar, NP  cyanocobalamin (VITAMIN B12) 1000 MCG/ML injection Inject 1,000 mcg into the muscle every 30 (thirty) days. 08/31/22   [provider]  DULoxetine (CYMBALTA) 60 MG capsule Take 60 mg by mouth daily. 06/28/22   [provider]  insulin aspart (NOVOLOG) 100 UNIT/ML FlexPen Inject 0-18 Units into the skin 3 (three) times daily with meals. Check Blood Glucose (BG) and inject per scale: BG <150= 0 unit; BG 150-200= 3 unit; BG 201-250= 6 unit; BG 251-300= 9 unit; BG 301-350= 12 unit; BG 351-400= 15 unit; BG >400= 18 unit and Call Primary Care. 02/04/23 05/05/23  Dahal, Melina Schools, MD  insulin detemir (LEVEMIR FLEXTOUCH) 100 UNIT/ML FlexPen Inject 15 Units into the skin 2 (two) times daily. 02/04/23   Lorin Glass, MD  Insulin Pen Needle (B-D UF III MINI PEN NEEDLES) 31G X 5 MM MISC To use with Insulin pens, as directed 11/20/21   Dulce Sellar, NP  Insulin Syringes, Disposable, U-100 1 ML MISC Inject 1 each into the skin in the morning and at bedtime. 08/10/21   Dulce Sellar, NP  metoCLOPramide (REGLAN) 10 MG tablet Take 1 tablet (10 mg total) by mouth every 6 (six) hours as needed for nausea or vomiting. 12/31/22   Netta Corrigan, PA-C  metoCLOPramide (REGLAN) 10 MG tablet Take 1 tablet (10 mg total) by mouth 3 (three) times daily with meals for 14 days. 02/04/23 02/18/23  Lorin Glass, MD  mirtazapine (REMERON) 15 MG tablet Take 15-30 mg by mouth at bedtime. 01/16/23   [provider]  promethazine (PHENERGAN) 25 MG tablet Take 1 tablet (25 mg total) by mouth every 8 (eight) hours as needed for nausea or vomiting. 04/02/23   Leroy Sea, MD    Physical Exam: Vitals:   04/04/23 1327  BP: (!) 178/109  Pulse: (!) 102  Resp: 20  Temp: 98.2 F (36.8 C)  TempSrc: Oral  SpO2: 100%   Physical Exam Vitals and nursing note reviewed.  Constitutional:       General: She is awake. She is not in acute distress.    Appearance: Normal appearance. She is ill-appearing. She is not toxic-appearing.  HENT:     Head: Normocephalic.     Nose: No rhinorrhea.     Mouth/Throat:     Mouth: Mucous membranes are dry.  Eyes:     General: No scleral icterus.    Pupils: Pupils are equal, round, and reactive to light.  Neck:     Vascular: No JVD.  Cardiovascular:     Rate and Rhythm: Regular rhythm. Tachycardia present.     Heart sounds: S1 normal and S2 normal.  Pulmonary:     Effort: Pulmonary effort is normal.     Breath sounds: Normal breath sounds.  Abdominal:     General: Bowel sounds are normal.     Palpations: Abdomen is soft.  Musculoskeletal:     Cervical back: Neck supple.  Right lower leg: No edema.     Left lower leg: No edema.  Skin:    General: Skin is warm and dry.  Neurological:     General: No focal deficit present.     Mental Status: She is alert and oriented to person, place, and time.  Psychiatric:        Mood and Affect: Mood normal.        Behavior: Behavior normal. Behavior is cooperative.     Data Reviewed:  Results are pending, will review when available. EKG: Vent. rate 108 BPM PR interval 134 ms QRS duration 70 ms QT/QTcB 348/466 ms P-R-T axes 78 95 69 Sinus tachycardia Rightward axis Borderline ECG  Assessment and Plan: Principal Problem:   DKA (diabetic ketoacidosis) (HCC) In the setting of:   Uncontrolled type 1 diabetes mellitus with hyperglycemia (HCC) Associated with:   Abdominal pain/Nausea and vomiting Observation/stepdown. Keep NPO. Continue IV fluids. Continue insulin infusion. Monitor CBG closely. BMP every 4 hours. BHA every 8 hours. Replace electrolytes as needed. Consult diabetes coordinator. Transition to SQ insulin per Endo tool.  Active Problems:   Transaminitis Superimposed on:   Cholelithiasis And;   Hepatic steatosis Continue IV hydration. Antiemetics as  needed. Analgesics as needed. Follow hepatic functions in AM. Consider MRCP, GI and/or CCS consult if no improvement. Otherwise the patient should follow-up PCP and/or surgery as an OP.     Advance Care Planning:   Code Status: Full Code   Consults:   Family Communication:   Severity of Illness: The appropriate patient status for this patient is OBSERVATION. Observation status is judged to be reasonable and necessary in order to provide the required intensity of service to ensure the patient's safety. The patient's presenting symptoms, physical exam findings, and initial radiographic and laboratory data in the context of their medical condition is felt to place them at decreased risk for further clinical deterioration. Furthermore, it is anticipated that the patient will be medically stable for discharge from the hospital within 2 midnights of admission.   Author: Bobette Mo, MD 04/04/2023 3:45 PM  For on call review [email or web address].   This document was prepared using Dragon voice recognition software and may contain some unintended transcription errors.

## 2023-04-04 NOTE — Progress Notes (Signed)
Admission from the Ed by bed awake and alert.  Insulin gtt in progress.

## 2023-04-04 NOTE — ED Provider Notes (Signed)
[place] [facility] AT [facility] Provider Note  CSN: [id number] Arrival date & time: 04/04/23 1322  Chief Complaint(s) No chief complaint on file.  HPI [name] is a 37 y.o. female history of type 1 diabetes, gastroparesis presenting to the emergency department with nausea and vomiting.  She reports "total body pain", consistent with prior episodes of gastroparesis and high blood sugar.  She reports she has been compliant with her insulin at home.  Today her symptoms began and she is having intractable nausea and vomiting.  This is consistent with prior episodes.  No chest pain, back pain.  Reports diffuse abdominal pain which is unchanged from prior episodes.  No urinary symptoms, fevers or chills, shortness of breath.   Past Medical History Past Medical History:  Diagnosis Date   Chronic constipation 11/08/2021   Diabetic neuropathy (HCC)    History of chicken pox    History of noncompliance with medical treatment 09/07/2022   Type 1 diabetes (HCC)    Vaginal odor 11/08/2021   Patient Active Problem List   Diagnosis Date Noted   Type 1 diabetes mellitus with hyperosmolar hyperglycemic state (HHS) (HCC) 03/31/2023   Intractable cyclical vomiting with nausea 03/31/2023   Cellulitis 02/02/2023   Severe hyperglycemia due to diabetes mellitus (HCC) 02/02/2023   Prolonged QT interval 02/02/2023   Intractable nausea and vomiting 09/07/2022   Diabetic gastroparesis (HCC) 09/07/2022   Uncontrolled type 1 diabetes mellitus with hyperglycemia, with long-term current use of insulin (HCC) 09/07/2022   Diabetes mellitus due to underlying condition with diabetic neuropathy (HCC) 09/07/2022   History of noncompliance with medical treatment 09/07/2022   DKA (diabetic ketoacidosis) (HCC) 09/06/2022   Nausea & vomiting 05/08/2022   Misuse of medication 05/08/2022   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   AKI (acute kidney injury) (HCC)  03/09/2022   UTI (urinary tract infection) 03/09/2022   Chronic pain syndrome 03/09/2022   Vulvar dysplasia    Type 1 diabetes mellitus with hyperlipidemia (HCC) 12/02/2021   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   Gastroesophageal reflux disease with esophagitis without hemorrhage 11/28/2021   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 09/10/2021   Drug induced constipation 08/10/2021   Vaginal yeast infection 08/10/2021   Abdominal pain 02/15/2021   Nausea and vomiting 02/15/2021   Diabetic polyneuropathy associated with type 1 diabetes mellitus (HCC) 03/14/2019   Non compliance with medical treatment 03/14/2019   DKA, type 1 (HCC) 07/21/2018   Uncontrolled type 1 diabetes mellitus with hyperglycemia (HCC) 07/03/2011    Class: Acute   Home Medication(s) Prior to Admission medications   Medication Sig Start Date End Date Taking? Authorizing Provider  amitriptyline (ELAVIL) 25 MG tablet Take 75 mg by mouth at bedtime. 06/26/22   [provider]  Continuous Blood Gluc Transmit (DEXCOM G6 TRANSMITTER) MISC Inject 1 each into the skin every 14 (fourteen) days. 12/31/21   Dulce Sellar, NP  cyanocobalamin (VITAMIN B12) 1000 MCG/ML injection Inject 1,000 mcg into the muscle every 30 (thirty) days. 08/31/22   [provider]  DULoxetine (CYMBALTA) 60 MG capsule Take 60 mg by mouth daily. 06/28/22   [provider]  insulin aspart (NOVOLOG) 100 UNIT/ML FlexPen Inject 0-18 Units into the skin 3 (three) times daily with meals. Check Blood Glucose (BG) and inject per scale: BG <150= 0 unit; BG 150-200= 3 unit; BG 201-250= 6 unit; BG 251-300= 9 unit; BG 301-350= 12 unit; BG 351-400= 15 unit; BG >400= 18 unit and Call Primary Care. 02/04/23  05/05/23  Dahal, Melina Schools, MD  insulin detemir (LEVEMIR FLEXTOUCH) 100 UNIT/ML FlexPen Inject 15 Units into the skin 2 (two) times daily. 02/04/23   Lorin Glass, MD  Insulin Pen Needle (B-D UF III MINI PEN NEEDLES) 31G X 5 MM MISC To use with Insulin pens,  as directed 11/20/21   Dulce Sellar, NP  Insulin Syringes, Disposable, U-100 1 ML MISC Inject 1 each into the skin in the morning and at bedtime. 08/10/21   Dulce Sellar, NP  metoCLOPramide (REGLAN) 10 MG tablet Take 1 tablet (10 mg total) by mouth every 6 (six) hours as needed for nausea or vomiting. 12/31/22   Netta Corrigan, PA-C  metoCLOPramide (REGLAN) 10 MG tablet Take 1 tablet (10 mg total) by mouth 3 (three) times daily with meals for 14 days. 02/04/23 02/18/23  Lorin Glass, MD  mirtazapine (REMERON) 15 MG tablet Take 15-30 mg by mouth at bedtime. 01/16/23   [provider]  promethazine (PHENERGAN) 25 MG tablet Take 1 tablet (25 mg total) by mouth every 8 (eight) hours as needed for nausea or vomiting. 04/02/23   Leroy Sea, MD                                                                                                                                    Past Surgical History Past Surgical History:  Procedure Laterality Date   CESAREAN SECTION     only one C/S per pt.   CO2 LASER APPLICATION N/A 02/22/2022   Procedure: CO2 LASER APPLICATION TO VULVA;  Surgeon: Carver Fila, MD;  Location: [facility] [place];  Service: General;  Laterality: N/A;   DILATION AND CURETTAGE OF UTERUS     EXCISION OF SKIN TAG N/A 02/22/2022   Procedure: EXCISION OF ANAL SKIN TAGS;  Surgeon: Romie Levee, MD;  Location: [facility];  Service: General;  Laterality: N/A;   Family History Family History  Problem Relation Age of Onset   Hypertension Mother    Glaucoma Mother    Diabetes Maternal Grandmother    Neuropathy Neg Hx    Colon cancer Neg Hx    Stomach cancer Neg Hx    Esophageal cancer Neg Hx    Colon polyps Neg Hx    Rectal cancer Neg Hx     Social History Social History   Tobacco Use   Smoking status: Never    Passive exposure: Never   Smokeless tobacco: Never  Vaping Use   Vaping status: Never Used  Substance Use Topics    Alcohol use: No   Drug use: Never   Allergies Pramipexole  Review of Systems Review of Systems  Physical Exam Vital Signs  I have reviewed the triage vital signs BP (!) 178/109 (BP Location: Right Arm)   Pulse (!) 102   Temp 98.2 F (36.8 C) (Oral)   Resp 20   LMP 04/01/2023   SpO2 100%  Physical Exam Vitals and nursing note reviewed.  Constitutional:      General: She is not in acute distress.    Appearance: She is well-developed.  HENT:     Head: Normocephalic and atraumatic.     Mouth/Throat:     Mouth: Mucous membranes are dry.  Eyes:     Pupils: Pupils are equal, round, and reactive to light.  Cardiovascular:     Rate and Rhythm: Regular rhythm. Tachycardia present.     Heart sounds: No murmur heard. Pulmonary:     Effort: Pulmonary effort is normal. No respiratory distress.     Breath sounds: Normal breath sounds.  Abdominal:     General: Abdomen is flat.     Palpations: Abdomen is soft.     Tenderness: There is abdominal tenderness (diffusely). There is no guarding or rebound.  Musculoskeletal:        General: No tenderness.     Right lower leg: No edema.     Left lower leg: No edema.  Skin:    General: Skin is warm and dry.  Neurological:     General: No focal deficit present.     Mental Status: She is alert. Mental status is at baseline.  Psychiatric:        Mood and Affect: Mood normal.        Behavior: Behavior normal.     ED Results and Treatments Labs (all labs ordered are listed, but only abnormal results are displayed) Labs Reviewed  COMPREHENSIVE METABOLIC PANEL - Abnormal; Notable for the following components:      Result Value   Sodium 134 (*)    Chloride 93 (*)    Glucose, Bld 405 (*)    BUN 5 (*)    AST 362 (*)    ALT 344 (*)    Alkaline Phosphatase 272 (*)    All other components within normal limits  BETA-HYDROXYBUTYRIC ACID - Abnormal; Notable for the following components:   Beta-Hydroxybutyric Acid 2.11 (*)    All other  components within normal limits  URINALYSIS, ROUTINE W REFLEX MICROSCOPIC - Abnormal; Notable for the following components:   Glucose, UA >=500 (*)    Hgb urine dipstick LARGE (*)    Ketones, ur 20 (*)    Protein, ur 30 (*)    Bacteria, UA RARE (*)    All other components within normal limits  CBG MONITORING, ED - Abnormal; Notable for the following components:   Glucose-Capillary 352 (*)    All other components within normal limits  I-STAT VENOUS BLOOD GAS, ED - Abnormal; Notable for the following components:   pH, Ven 7.434 (*)    pCO2, Ven 43.4 (*)    pO2, Ven 31 (*)    Bicarbonate 29.1 (*)    Acid-Base Excess 4.0 (*)    All other components within normal limits  HCG, QUANTITATIVE, PREGNANCY  CBC WITH DIFFERENTIAL/PLATELET  LIPASE, BLOOD  BETA-HYDROXYBUTYRIC ACID  HEPATITIS PANEL, ACUTE  CBG MONITORING, ED  Radiology No results found.  Pertinent labs & imaging results that were available during my care of the patient were reviewed by me and considered in my medical decision making (see MDM for details).  Medications Ordered in ED Medications  lactated ringers bolus 1,000 mL (has no administration in time range)  HYDROmorphone (DILAUDID) injection 0.5 mg (has no administration in time range)  metoCLOPramide (REGLAN) injection 10 mg (has no administration in time range)  insulin regular, human (MYXREDLIN) 100 units/ 100 mL infusion (has no administration in time range)  dextrose 5 % in lactated ringers infusion (has no administration in time range)  dextrose 50 % solution 0-50 mL (has no administration in time range)  lactated ringers infusion (has no administration in time range)  enoxaparin (LOVENOX) injection 40 mg (has no administration in time range)  acetaminophen (TYLENOL) tablet 650 mg (has no administration in time range)    Or  acetaminophen  (TYLENOL) suppository 650 mg (has no administration in time range)  prochlorperazine (COMPAZINE) injection 10 mg (has no administration in time range)  lactated ringers bolus 1,000 mL (1,000 mLs Intravenous Bolus 04/04/23 1422)  haloperidol lactate (HALDOL) injection 2 mg (2 mg Intravenous Given 04/04/23 1427)                                                                                                                                     Procedures .Critical Care  Performed by: Lonell Grandchild, MD Authorized by: Lonell Grandchild, MD   Critical care provider statement:    Critical care time (minutes):  30   Critical care was necessary to treat or prevent imminent or life-threatening deterioration of the following conditions:  Endocrine crisis   Critical care was time spent personally by me on the following activities:  Development of treatment plan with patient or surrogate, discussions with consultants, evaluation of patient's response to treatment, examination of patient, ordering and review of laboratory studies, ordering and review of radiographic studies, ordering and performing treatments and interventions, pulse oximetry, re-evaluation of patient's condition and review of old charts   Care discussed with: admitting provider     (including critical care time)  Medical Decision Making / ED Course   MDM:  37 year old female presenting to the emergency department with nausea and vomiting.  Suspect early DKA.  Patient has elevated hydroxybutyrate.  Her pH is normal but reports vomiting all day which could falsely elevate her pH.  She is also very hyperglycemic.  Seems consistent with previous episodes of gastroparesis.  Her LFTs and alk phos are elevated with a normal bilirubin, no focal right upper quadrant tenderness, will obtain acute hepatitis panel and right upper quadrant ultrasound.  Discussed with Dr. Robb Matar given patient's elevated beta hydroxybutyrate, he will admit  patient for further management of early DKA.      Additional history obtained: -Additional history obtained from ems -External records from outside source obtained and  reviewed including: Chart review including previous notes, labs, imaging, consultation notes including recent d/c summary   Lab Tests: -I ordered, reviewed, and interpreted labs.   The pertinent results include:   Labs Reviewed  COMPREHENSIVE METABOLIC PANEL - Abnormal; Notable for the following components:      Result Value   Sodium 134 (*)    Chloride 93 (*)    Glucose, Bld 405 (*)    BUN 5 (*)    AST 362 (*)    ALT 344 (*)    Alkaline Phosphatase 272 (*)    All other components within normal limits  BETA-HYDROXYBUTYRIC ACID - Abnormal; Notable for the following components:   Beta-Hydroxybutyric Acid 2.11 (*)    All other components within normal limits  URINALYSIS, ROUTINE W REFLEX MICROSCOPIC - Abnormal; Notable for the following components:   Glucose, UA >=500 (*)    Hgb urine dipstick LARGE (*)    Ketones, ur 20 (*)    Protein, ur 30 (*)    Bacteria, UA RARE (*)    All other components within normal limits  CBG MONITORING, ED - Abnormal; Notable for the following components:   Glucose-Capillary 352 (*)    All other components within normal limits  I-STAT VENOUS BLOOD GAS, ED - Abnormal; Notable for the following components:   pH, Ven 7.434 (*)    pCO2, Ven 43.4 (*)    pO2, Ven 31 (*)    Bicarbonate 29.1 (*)    Acid-Base Excess 4.0 (*)    All other components within normal limits  HCG, QUANTITATIVE, PREGNANCY  CBC WITH DIFFERENTIAL/PLATELET  LIPASE, BLOOD  BETA-HYDROXYBUTYRIC ACID  HEPATITIS PANEL, ACUTE  CBG MONITORING, ED    Notable for elevated beta hydroxybutyrate    Medicines ordered and prescription drug management: Meds ordered this encounter  Medications   lactated ringers bolus 1,000 mL   haloperidol lactate (HALDOL) injection 2 mg   lactated ringers bolus 1,000 mL    HYDROmorphone (DILAUDID) injection 0.5 mg   metoCLOPramide (REGLAN) injection 10 mg   insulin regular, human (MYXREDLIN) 100 units/ 100 mL infusion    Order Specific Question:   EndoTool low target:    Answer:   140    Order Specific Question:   EndoTool high target:    Answer:   180    Order Specific Question:   Type of Diabetes    Answer:   Type 1    Order Specific Question:   Mode of Therapy    Answer:   ENDOX1 for DKA    Order Specific Question:   Start Method    Answer:   EndoTool to calculate   dextrose 5 % in lactated ringers infusion   dextrose 50 % solution 0-50 mL   lactated ringers infusion   enoxaparin (LOVENOX) injection 40 mg   OR Linked Order Group    acetaminophen (TYLENOL) tablet 650 mg    acetaminophen (TYLENOL) suppository 650 mg   prochlorperazine (COMPAZINE) injection 10 mg    -I have reviewed the patients home medicines and have made adjustments as needed   Consultations Obtained: I requested consultation with the hospitalist,  and discussed lab and imaging findings as well as pertinent plan - they recommend: admission   Cardiac Monitoring: The patient was maintained on a cardiac monitor.  I personally viewed and interpreted the cardiac monitored which showed an underlying rhythm of: sinus tachycardia   Social Determinants of Health:  Diagnosis or treatment significantly limited by social determinants of health:  obesity   Reevaluation: After the interventions noted above, I reevaluated the patient and found that their symptoms have improved  Co morbidities that complicate the patient evaluation  Past Medical History:  Diagnosis Date   Chronic constipation 11/08/2021   Diabetic neuropathy (HCC)    History of chicken pox    History of noncompliance with medical treatment 09/07/2022   Type 1 diabetes (HCC)    Vaginal odor 11/08/2021      Dispostion: Disposition decision including need for hospitalization was considered, and patient admitted to the  hospital.    Final Clinical Impression(s) / ED Diagnoses Final diagnoses:  Diabetic ketoacidosis without coma associated with type 1 diabetes mellitus (HCC)     This chart was dictated using voice recognition software.  Despite best efforts to proofread,  errors can occur which can change the documentation meaning.    Lonell Grandchild, MD 04/04/23 [phone]

## 2023-04-04 NOTE — ED Notes (Signed)
Cbg 369

## 2023-04-04 NOTE — ED Triage Notes (Addendum)
Pt BIB [facility] from home with c/o hyperglycemia. Discharged earlier this week for DKA. Woke up this morning feeling nauseous, vomited several times, unable to keep anything down. 4 mg Zofran given IM.  192/100 HR 110 CG 446

## 2023-04-04 NOTE — ED Provider Triage Note (Signed)
Emergency Medicine Provider Triage Evaluation Note  [name] , a 37 y.o. female  was evaluated in triage.  Pt complains of hyperglycemia, vomiting, abdominal pain.  Review of Systems  Positive:  Negative:   Physical Exam  BP (!) 178/109 (BP Location: Right Arm)   Pulse (!) 102   Temp 98.2 F (36.8 C) (Oral)   Resp 20   LMP 04/01/2023   SpO2 100%  Gen:   Awake, no distress   Resp:  Normal effort  MSK:   Moves extremities without difficulty  Other:    Medical Decision Making  Medically screening exam initiated at 1:32 PM.  Appropriate orders placed.  [name] was informed that the remainder of the evaluation will be completed by another provider, this initial triage assessment does not replace that evaluation, and the importance of remaining in the ED until their evaluation is complete.  Concerned for Hyperglycemia. Hx DM type 1. Endorses compliance with Insulin. Vomiting and abdominal pain since this morning. Initial CBG in the 300s.   [name], [place] 04/04/23 1336

## 2023-04-05 ENCOUNTER — Other Ambulatory Visit (HOSPITAL_COMMUNITY): Payer: Self-pay

## 2023-04-05 DIAGNOSIS — R7401 Elevation of levels of liver transaminase levels: Secondary | ICD-10-CM

## 2023-04-05 DIAGNOSIS — E1065 Type 1 diabetes mellitus with hyperglycemia: Secondary | ICD-10-CM | POA: Diagnosis not present

## 2023-04-05 DIAGNOSIS — K8020 Calculus of gallbladder without cholecystitis without obstruction: Secondary | ICD-10-CM

## 2023-04-05 DIAGNOSIS — R112 Nausea with vomiting, unspecified: Secondary | ICD-10-CM | POA: Diagnosis not present

## 2023-04-05 DIAGNOSIS — K760 Fatty (change of) liver, not elsewhere classified: Secondary | ICD-10-CM

## 2023-04-05 LAB — GLUCOSE, CAPILLARY
Glucose-Capillary: 159 mg/dL — ABNORMAL HIGH (ref 70–99)
Glucose-Capillary: 160 mg/dL — ABNORMAL HIGH (ref 70–99)
Glucose-Capillary: 193 mg/dL — ABNORMAL HIGH (ref 70–99)
Glucose-Capillary: 201 mg/dL — ABNORMAL HIGH (ref 70–99)
Glucose-Capillary: 203 mg/dL — ABNORMAL HIGH (ref 70–99)
Glucose-Capillary: 218 mg/dL — ABNORMAL HIGH (ref 70–99)
Glucose-Capillary: 245 mg/dL — ABNORMAL HIGH (ref 70–99)

## 2023-04-05 LAB — RENAL FUNCTION PANEL
Albumin: 2.9 g/dL — ABNORMAL LOW (ref 3.5–5.0)
Anion gap: 11 (ref 5–15)
BUN: 6 mg/dL (ref 6–20)
CO2: 26 mmol/L (ref 22–32)
Calcium: 8.6 mg/dL — ABNORMAL LOW (ref 8.9–10.3)
Chloride: 97 mmol/L — ABNORMAL LOW (ref 98–111)
Creatinine, Ser: 0.79 mg/dL (ref 0.44–1.00)
GFR, Estimated: >60 mL/min (ref >60)
Glucose, Bld: 275 mg/dL — ABNORMAL HIGH (ref 70–99)
Phosphorus: 3.2 mg/dL (ref 2.5–4.6)
Potassium: 3.6 mmol/L (ref 3.5–5.1)
Sodium: 134 mmol/L — ABNORMAL LOW (ref 135–145)

## 2023-04-05 LAB — BASIC METABOLIC PANEL
Anion gap: 12 (ref 5–15)
BUN: <5 mg/dL — ABNORMAL LOW (ref 6–20)
CO2: 28 mmol/L (ref 22–32)
Calcium: 8.9 mg/dL (ref 8.9–10.3)
Chloride: 98 mmol/L (ref 98–111)
Creatinine, Ser: 0.7 mg/dL (ref 0.44–1.00)
GFR, Estimated: >60 mL/min (ref >60)
Glucose, Bld: 160 mg/dL — ABNORMAL HIGH (ref 70–99)
Potassium: 3 mmol/L — ABNORMAL LOW (ref 3.5–5.1)
Sodium: 138 mmol/L (ref 135–145)

## 2023-04-05 LAB — COMPREHENSIVE METABOLIC PANEL
ALT: 246 U/L — ABNORMAL HIGH (ref 0–44)
AST: 131 U/L — ABNORMAL HIGH (ref 15–41)
Albumin: 3.1 g/dL — ABNORMAL LOW (ref 3.5–5.0)
Alkaline Phosphatase: 214 U/L — ABNORMAL HIGH (ref 38–126)
Anion gap: 11 (ref 5–15)
BUN: 6 mg/dL (ref 6–20)
CO2: 28 mmol/L (ref 22–32)
Calcium: 9 mg/dL (ref 8.9–10.3)
Chloride: 98 mmol/L (ref 98–111)
Creatinine, Ser: 0.77 mg/dL (ref 0.44–1.00)
GFR, Estimated: >60 mL/min (ref >60)
Glucose, Bld: 191 mg/dL — ABNORMAL HIGH (ref 70–99)
Potassium: 3.3 mmol/L — ABNORMAL LOW (ref 3.5–5.1)
Sodium: 137 mmol/L (ref 135–145)
Total Bilirubin: 0.7 mg/dL (ref 0.3–1.2)
Total Protein: 6.3 g/dL — ABNORMAL LOW (ref 6.5–8.1)

## 2023-04-05 LAB — HEPATIC FUNCTION PANEL
ALT: 186 U/L — ABNORMAL HIGH (ref 0–44)
AST: 70 U/L — ABNORMAL HIGH (ref 15–41)
Albumin: 3 g/dL — ABNORMAL LOW (ref 3.5–5.0)
Alkaline Phosphatase: 176 U/L — ABNORMAL HIGH (ref 38–126)
Bilirubin, Direct: 0.1 mg/dL (ref 0.0–0.2)
Indirect Bilirubin: 0.4 mg/dL (ref 0.3–0.9)
Total Bilirubin: 0.5 mg/dL (ref 0.3–1.2)
Total Protein: 6.1 g/dL — ABNORMAL LOW (ref 6.5–8.1)

## 2023-04-05 LAB — BETA-HYDROXYBUTYRIC ACID: Beta-Hydroxybutyric Acid: 0.09 mmol/L (ref 0.05–0.27)

## 2023-04-05 LAB — HIV ANTIBODY (ROUTINE TESTING W REFLEX): HIV Screen 4th Generation wRfx: NONREACTIVE

## 2023-04-05 LAB — MAGNESIUM: Magnesium: 1.7 mg/dL (ref 1.7–2.4)

## 2023-04-05 MED ORDER — LANCET DEVICE MISC: 1.0000 | Freq: Three times a day (TID) | 0 refills | Status: DC

## 2023-04-05 MED ORDER — INSULIN DETEMIR 100 UNIT/ML ~~LOC~~ SOLN
15.0000 [IU] | Freq: Two times a day (BID) | SUBCUTANEOUS | Status: AC
  Administered 2023-04-05: 15 [IU] via SUBCUTANEOUS
  Filled 2023-04-05: qty 0.15

## 2023-04-05 MED ORDER — ALUM & MAG HYDROXIDE-SIMETH 200-200-20 MG/5ML PO SUSP: 30.0000 mL | Freq: Three times a day (TID) | ORAL | Status: DC | PRN

## 2023-04-05 MED ORDER — MAGNESIUM SULFATE IN D5W 1-5 GM/100ML-% IV SOLN
1.0000 g | Freq: Once | INTRAVENOUS | Status: AC
  Administered 2023-04-05: 1 g via INTRAVENOUS
  Filled 2023-04-05: qty 100

## 2023-04-05 MED ORDER — OXYCODONE HCL 5 MG PO TABS: 5.0000 mg | ORAL_TABLET | Freq: Once | ORAL | Status: DC

## 2023-04-05 MED ORDER — PANTOPRAZOLE SODIUM 40 MG PO TBEC: 40.0000 mg | DELAYED_RELEASE_TABLET | Freq: Every day | ORAL | Status: DC

## 2023-04-05 MED ORDER — INSULIN DETEMIR 100 UNIT/ML FLEXPEN: 15.0000 [IU] | PEN_INJECTOR | Freq: Two times a day (BID) | SUBCUTANEOUS | Status: DC

## 2023-04-05 MED ORDER — LANCETS MISC. MISC: 1.0000 | Freq: Three times a day (TID) | 0 refills | Status: DC

## 2023-04-05 MED ORDER — POTASSIUM CHLORIDE 20 MEQ PO PACK: 40.0000 meq | PACK | Freq: Once | ORAL | Status: DC

## 2023-04-05 MED ORDER — INSULIN DETEMIR 100 UNIT/ML ~~LOC~~ SOLN
15.0000 [IU] | Freq: Two times a day (BID) | SUBCUTANEOUS | Status: DC
  Filled 2023-04-05: qty 0.15

## 2023-04-05 MED ORDER — INSULIN ASPART 100 UNIT/ML IJ SOLN: 0.0000 [IU] | Freq: Every day | INTRAMUSCULAR | Status: DC

## 2023-04-05 MED ORDER — POTASSIUM CHLORIDE 2 MEQ/ML IV SOLN
INTRAVENOUS | Status: DC
  Filled 2023-04-05: qty 1000

## 2023-04-05 MED ORDER — INSULIN ASPART 100 UNIT/ML IJ SOLN
0.0000 [IU] | Freq: Three times a day (TID) | INTRAMUSCULAR | Status: DC
  Administered 2023-04-05 (×2): 2 [IU] via SUBCUTANEOUS

## 2023-04-05 MED ORDER — POTASSIUM CHLORIDE 10 MEQ/100ML IV SOLN
10.0000 meq | INTRAVENOUS | Status: AC
  Administered 2023-04-05 (×2): 10 meq via INTRAVENOUS
  Filled 2023-04-05 (×2): qty 100

## 2023-04-05 MED ORDER — BLOOD GLUCOSE MONITORING SUPPL DEVI: 1.0000 | Freq: Three times a day (TID) | 0 refills | Status: DC

## 2023-04-05 MED ORDER — METOCLOPRAMIDE HCL 5 MG/ML IJ SOLN: 5.0000 mg | Freq: Three times a day (TID) | INTRAMUSCULAR | Status: DC

## 2023-04-05 MED ORDER — INSULIN DETEMIR 100 UNIT/ML ~~LOC~~ SOLN
15.0000 [IU] | Freq: Two times a day (BID) | SUBCUTANEOUS | Status: DC
  Administered 2023-04-05: 15 [IU] via SUBCUTANEOUS
  Filled 2023-04-05 (×2): qty 0.15

## 2023-04-05 MED ORDER — INSULIN DETEMIR 100 UNIT/ML ~~LOC~~ SOLN: 15.0000 [IU] | Freq: Two times a day (BID) | SUBCUTANEOUS | Status: DC

## 2023-04-05 MED ORDER — METOCLOPRAMIDE HCL 5 MG PO TABS: 10.0000 mg | ORAL_TABLET | Freq: Three times a day (TID) | ORAL | Status: DC

## 2023-04-05 MED ORDER — ONDANSETRON HCL 4 MG/2ML IJ SOLN: 4.0000 mg | Freq: Three times a day (TID) | INTRAMUSCULAR | Status: DC | PRN

## 2023-04-05 MED ORDER — PANTOPRAZOLE SODIUM 40 MG PO TBEC
40.0000 mg | DELAYED_RELEASE_TABLET | Freq: Two times a day (BID) | ORAL | 2 refills | Status: DC
  Filled 2023-04-05: qty 60, 30d supply, fill #0

## 2023-04-05 MED ORDER — INSULIN ASPART 100 UNIT/ML IJ SOLN: 0.0000 [IU] | Freq: Three times a day (TID) | INTRAMUSCULAR | Status: DC

## 2023-04-05 MED ORDER — POTASSIUM CHLORIDE 20 MEQ PO PACK: 80.0000 meq | PACK | Freq: Once | ORAL | Status: DC

## 2023-04-05 MED ORDER — METOCLOPRAMIDE HCL 5 MG/ML IJ SOLN: 10.0000 mg | Freq: Three times a day (TID) | INTRAMUSCULAR | Status: DC

## 2023-04-05 MED ORDER — PANTOPRAZOLE SODIUM 40 MG PO TBEC: 40.0000 mg | DELAYED_RELEASE_TABLET | Freq: Two times a day (BID) | ORAL | Status: DC

## 2023-04-05 MED ORDER — BLOOD GLUCOSE TEST VI STRP: 1.0000 | ORAL_STRIP | Freq: Three times a day (TID) | 0 refills | Status: DC

## 2023-04-05 NOTE — Progress Notes (Addendum)
  Repeat lab at 11:30 PM showed bicarb 28 and anion gap elevation.  Blood glucose 191. - Given bicarb level has been improved restarting patient subcu insulin 15 units twice daily and bridging with insulin drip for next 2 hours.  Instructed nurse to give Levemir 15 unit now and continue the insulin drip and D5 LR 2 hours, after that stop the insulin drip and stop the D5 LR. -Repeat BMP at 3 AM. -Low potassium 3.3.  Ordered IV KCl 68M EQ x 4. -If repeat BMP at 3 AM showed normal bicarb level and no anion gap will resume clear liquid diet.   Tereasa Coop, MD [facility] 04/05/2023, 12:43 AM

## 2023-04-05 NOTE — Progress Notes (Signed)
Insulin gtt was stopped at 0419 and patients 4 runs of potassium was started. Patient only has one PIV. Potassium is being piggybacked into NS gtt to prevent burning of vessels. Will monitor patient's tolerance to gtts. Blood glucose was 201 when insulin gtt was d/c'd. Bmp is now being drawn.

## 2023-04-05 NOTE — Discharge Summary (Addendum)
Physician Discharge Summary  [name] XBJ:[id number] DOB: [birth year] DOA: 04/04/2023  PCP: Patient, No Pcp Per  Admit date: 04/04/2023 Discharge date: 04/05/2023 Admitted From: Home Disposition: Home Recommendations for Outpatient Follow-up:  Follow up with PCP in 1 week Check glycemic control, CMP and CBC at follow-up Recommend referral to general surgery for cholelithiasis. On amitriptyline and Cymbalta at the same time.  Reassess need and adjust as appropriate Please follow up on the following pending results: Repeat LFT  Home Health: Not indicated Equipment/Devices: Not indicated  Discharge Condition: Stable CODE STATUS: Full code   Hospital course 37 year old F with PMH of DM-1 with neuropathy and gastropathy, chronic constipation and noncompliance presenting with nausea, vomiting, abdominal pain and hyperglycemia, and admitted for hyperglycemia, dehydration, intractable nausea, vomiting and abdominal pain.  In ED, BP elevated.  Afebrile.  CMP glucose 405.  Bicarb normal.  Anion gap 15.  pH 7.43.  BHA 2.11.  AST 362.  ALT 344.  ALP 273.  Total bili within normal.  RUQ US showed cholelithiasis without sonographic evidence of cholecystitis, and hepatic steatosis.  Patient was started on IV fluid, insulin drip, IV analgesics and antiemetics and admitted for overnight observation.  Patient was transition to subcu insulin overnight.  GI symptoms improved.  LFT improved.  Hypokalemia resolved.  Tolerated soft diet and discharged home on home medication including Reglan and insulin.  Also added p.o. Protonix 40 mg daily.  Counseled on the importance of compliance with her medication.  Recommended continuing small frequent soft diet for gastroparesis.  Outpatient follow-up with PCP in 1 to 2 weeks or sooner if needed.  See individual problem list below for more.   Problems addressed during this hospitalization Principal Problem:   Uncontrolled type 1 diabetes mellitus with  hyperglycemia (HCC) Active Problems:   Nausea and vomiting   Cholelithiasis   Hepatic steatosis   Transaminitis   Hypokalemia resolved.  LFT improved.  DKA ruled out.           Time spent 35 minutes  Vital signs Vitals:   04/05/23 0000 04/05/23 0301 04/05/23 0741 04/05/23 1147  BP: (!) 148/95 (!) 153/100 (!) 140/89 130/79  Pulse: (!) 109 (!) 114 (!) 106 99  Temp:  98.3 F (36.8 C) 97.9 F (36.6 C) 98.7 F (37.1 C)  Resp: 12 17 20 12   Height:      SpO2: 97% 97%    TempSrc:  Oral Oral Oral     Discharge exam  GENERAL: No apparent distress.  Nontoxic. HEENT: MMM.  Vision and hearing grossly intact.  NECK: Supple.  No apparent JVD.  RESP:  No IWOB.  Fair aeration bilaterally. CVS:  RRR. Heart sounds normal.  ABD/GI/GU: BS+. Abd soft, NTND.  MSK/EXT:  Moves extremities. No apparent deformity. No edema.  SKIN: no apparent skin lesion or wound NEURO: Awake and alert. Oriented appropriately.  No apparent focal neuro deficit. PSYCH: Calm. Normal affect.   Discharge Instructions Discharge Instructions     Diet - low sodium heart healthy   Complete by: As directed    Diet Carb Modified   Complete by: As directed    Discharge instructions   Complete by: As directed    It has been a pleasure taking care of you!  You were hospitalized due to nausea, vomiting, abdominal pain and elevated blood glucose.  Your symptoms improved with hydration and insulin.  Continue taking your medications as prescribed.  In addition, we also recommend you watch your carbohydrate intake.  You may try frequent small portion soft diet over the next few days.  Follow-up with your primary care doctor in 1 to 2 weeks or sooner if needed.  Note that oxycodone can make gastroparesis worse and increase your nausea and vomiting.  We also recommend you discuss about the need of amitriptyline while on Cymbalta.   Take care,   Increase activity slowly   Complete by: As directed       Allergies  as of 04/05/2023       Reactions   Mirapex [pramipexole] Shortness Of Breath        Medication List     TAKE these medications    amitriptyline 25 MG tablet Commonly known as: ELAVIL Take 75 mg by mouth at bedtime.   B-D UF III MINI PEN NEEDLES 31G X 5 MM Misc Generic drug: Insulin Pen Needle To use with Insulin pens, as directed   Blood Glucose Monitoring Suppl Devi 1 each by Does not apply route in the morning, at noon, and at bedtime. May substitute based on patient's preference and/or insurance.   BLOOD GLUCOSE TEST STRIPS Strp 1 each by In Vitro route in the morning, at noon, and at bedtime. May substitute based on patient's preference and/or insurance.   cyanocobalamin 1000 MCG/ML injection Commonly known as: VITAMIN B12 Inject 1,000 mcg into the muscle See admin instructions. 1000 mcg injected IM every Tuesday.   Dexcom G6 Transmitter Misc Inject 1 each into the skin every 14 (fourteen) days.   DULoxetine 60 MG capsule Commonly known as: CYMBALTA Take 60 mg by mouth daily.   insulin aspart 100 UNIT/ML FlexPen Commonly known as: NOVOLOG Inject 0-18 Units into the skin 3 (three) times daily with meals. Check Blood Glucose (BG) and inject per scale: BG <150= 0 unit; BG 150-200= 3 unit; BG 201-250= 6 unit; BG 251-300= 9 unit; BG 301-350= 12 unit; BG 351-400= 15 unit; BG >400= 18 unit and Call Primary Care.   Insulin Syringes (Disposable) U-100 1 ML Misc Inject 1 each into the skin in the morning and at bedtime.   Lancet Device Misc 1 each by Does not apply route in the morning, at noon, and at bedtime. May substitute based on patient's preference and/or insurance.   Lancets Misc. Misc 1 each by Does not apply route in the morning, at noon, and at bedtime. May substitute based on patient's preference and/or insurance.   Levemir FlexTouch 100 UNIT/ML FlexTouch Pen Generic drug: insulin detemir Inject 15 Units into the skin 2 (two) times daily. What changed:  how  much to take when to take this   metoCLOPramide 10 MG tablet Commonly known as: REGLAN Take 1 tablet (10 mg total) by mouth 3 (three) times daily with meals for 14 days.   oxyCODONE 15 MG immediate release tablet Commonly known as: ROXICODONE Take 15 mg by mouth every 6 (six) hours as needed for pain.   pantoprazole 40 MG tablet Commonly known as: PROTONIX Take 1 tablet (40 mg total) by mouth 2 (two) times daily.   phentermine 37.5 MG tablet Commonly known as: ADIPEX-P Take 37.5 mg by mouth daily.        Consultations: None  Procedures/Studies:   US Abdomen Limited RUQ (LIVER/GB)  Result Date: 04/04/2023 CLINICAL DATA:  Abdominal pain EXAM: ULTRASOUND ABDOMEN LIMITED RIGHT UPPER QUADRANT COMPARISON:  CT abdomen pelvis 04/01/2023 FINDINGS: Gallbladder: Gallstones: Present Sludge: Present Gallbladder Wall: Within normal limits Pericholecystic fluid: None Sonographic Murphy's Sign: Negative per technologist Common bile duct:  Diameter: 3 mm Liver: Parenchymal echogenicity: Increased Contours: Normal Lesions: None Portal vein: Patent.  Hepatopetal flow Other: None. IMPRESSION: 1. Cholelithiasis without additional sonographic evidence of acute cholecystitis. 2. Diffuse increased echogenicity of the hepatic parenchyma is a nonspecific indicator of hepatocellular dysfunction, most commonly steatosis. Electronically Signed   By: Acquanetta Belling M.D.   On: 04/04/2023 16:29   CT ABDOMEN PELVIS W CONTRAST  Result Date: 04/01/2023 CLINICAL DATA:  Acute abdominal pain with nausea and vomiting EXAM: CT ABDOMEN AND PELVIS WITH CONTRAST TECHNIQUE: Multidetector CT imaging of the abdomen and pelvis was performed using the standard protocol following bolus administration of intravenous contrast. RADIATION DOSE REDUCTION: This exam was performed according to the departmental dose-optimization program which includes automated exposure control, adjustment of the mA and/or kV according to patient size  and/or use of iterative reconstruction technique. CONTRAST:  75mL OMNIPAQUE IOHEXOL 350 MG/ML SOLN COMPARISON:  09/27/2022 FINDINGS: Lower chest: No acute abnormality. Hepatobiliary: Diffuse low-density of the liver suspicious for steatosis. No suspicious liver lesions. Hyperdense content of the gallbladder likely combination of sludge and stones. No CT evidence of acute cholecystitis. No bile duct dilatation. Pancreas: Unremarkable. No pancreatic ductal dilatation or surrounding inflammatory changes. Spleen: Normal in size without focal abnormality. Adrenals/Urinary Tract: Adrenal glands are unremarkable. Kidneys are normal, without renal calculi, focal lesion, or hydronephrosis. Unable to evaluate the bladder due to collapse configuration. Stomach/Bowel: Stomach is within normal limits. Appendix appears normal. No evidence of bowel wall thickening, distention, or inflammatory changes. Vascular/Lymphatic: No significant vascular findings are present. No enlarged abdominal or pelvic lymph nodes. Reproductive: Enlarged heterogeneous uterus most likely due to fibroids. No adnexal abnormality. Other: Focal soft tissue thickening with underlying fat stranding again noted the right anterior abdominal wall. This is most likely a site of medication administration or prior injury. Mild stranding appears to have increased since prior examination. Musculoskeletal: No acute or significant osseous findings. IMPRESSION: 1. No acute abnormality of the abdomen or pelvis. 2. Hyperdense content of the gallbladder likely combination of sludge and stones. No CT evidence of acute cholecystitis. 3. Focal soft tissue thickening with underlying fat stranding again noted the right anterior abdominal wall. This is most likely a site of medication administration or prior injury. The subcutaneous fat stranding appears to have increased since prior examination. Please correlate with physical examination to exclude an enlarging cutaneous  mass/lesion. Electronically Signed   By: Acquanetta Belling M.D.   On: 04/01/2023 13:41   VAS US CAROTID  Result Date: 04/01/2023 Carotid Arterial Duplex Study Patient Name:  BALJIT RIBAR  Date of Exam:   04/01/2023 Medical Rec #: [id number]                 Accession #:    [id number] Date of Birth: Jan 01, 1986                 Patient Gender: F Patient Age:   57 years Exam Location:  [facility] Procedure:      VAS US CAROTID Referring Phys: DEBBY CROSLEY --------------------------------------------------------------------------------  Indications:       Syncope. Risk Factors:      Diabetes. Comparison Study:  No prior study Performing Technologist: Shona Simpson  Examination Guidelines: A complete evaluation includes B-mode imaging, spectral Doppler, color Doppler, and power Doppler as needed of all accessible portions of each vessel. Bilateral testing is considered an integral part of a complete examination. Limited examinations for reoccurring indications may be performed as noted.  Right Carotid Findings: +----------+--------+--------+--------+------------------+------------------+  PSV cm/sEDV cm/sStenosisPlaque DescriptionComments           +----------+--------+--------+--------+------------------+------------------+ CCA Prox  73      9                                                    +----------+--------+--------+--------+------------------+------------------+ CCA Distal90      22                                                   +----------+--------+--------+--------+------------------+------------------+ ICA Prox  95      25                                intimal thickening +----------+--------+--------+--------+------------------+------------------+ ICA Mid   86      35                                                   +----------+--------+--------+--------+------------------+------------------+ ICA Distal76      29                                                    +----------+--------+--------+--------+------------------+------------------+ ECA       134     19                                                   +----------+--------+--------+--------+------------------+------------------+ +----------+--------+-------+--------+-------------------+           PSV cm/sEDV cmsDescribeArm Pressure (mmHG) +----------+--------+-------+--------+-------------------+ [id number]                                        +----------+--------+-------+--------+-------------------+ +---------+--------+--+--------+--+ VertebralPSV cm/s40EDV cm/s16 +---------+--------+--+--------+--+  Left Carotid Findings: +----------+--------+--------+--------+------------------+------------------+           PSV cm/sEDV cm/sStenosisPlaque DescriptionComments           +----------+--------+--------+--------+------------------+------------------+ CCA Prox  100     14                                                   +----------+--------+--------+--------+------------------+------------------+ CCA Distal99      23                                                   +----------+--------+--------+--------+------------------+------------------+ ICA Prox  80      25  intimal thickening +----------+--------+--------+--------+------------------+------------------+ ICA Mid   84      34                                                   +----------+--------+--------+--------+------------------+------------------+ ICA Distal87      37                                                   +----------+--------+--------+--------+------------------+------------------+ ECA       118     14                                                   +----------+--------+--------+--------+------------------+------------------+ +----------+--------+--------+--------+-------------------+           PSV cm/sEDV  cm/sDescribeArm Pressure (mmHG) +----------+--------+--------+--------+-------------------+ Subclavian171                                         +----------+--------+--------+--------+-------------------+ +---------+--------+--+--------+--+ VertebralPSV cm/s62EDV cm/s26 +---------+--------+--+--------+--+   Summary: Right Carotid: The extracranial vessels were near-normal with only minimal wall                thickening or plaque. Left Carotid: The extracranial vessels were near-normal with only minimal wall               thickening or plaque. Vertebrals:  Bilateral vertebral arteries demonstrate antegrade flow. Subclavians: Normal flow hemodynamics were seen in bilateral subclavian              arteries. *See table(s) above for measurements and observations.  Electronically signed by Delia Heady MD on 04/01/2023 at 12:11:23 PM.    Final    ECHOCARDIOGRAM COMPLETE  Result Date: 04/01/2023    ECHOCARDIOGRAM REPORT   Patient Name:   VESTA DELASHMIT Date of Exam: 04/01/2023 Medical Rec #:  [id number]                Height:       66.0 in Accession #:    [id number]               Weight:       175.0 lb Date of Birth:  10-12-1985                BSA:          1.889 m Patient Age:    37 years                 BP:           96/75 mmHg Patient Gender: F                        HR:           115 bpm. Exam Location:  Inpatient Procedure: 2D Echo, Cardiac Doppler and Color Doppler Indications:    Syncope R55  History:        Patient has no prior history of Echocardiogram examinations.  Risk Factors:Diabetes.  Sonographer:    Lucendia Herrlich Referring Phys: Effie Shy PRASHANT [facility] IMPRESSIONS  1. Left ventricular ejection fraction, by estimation, is 65 to 70%. The left ventricle has normal function. The left ventricle has no regional wall motion abnormalities. Left ventricular diastolic parameters were normal.  2. Right ventricular systolic function is normal. The right ventricular size is  normal. Tricuspid regurgitation signal is inadequate for assessing PA pressure.  3. The mitral valve is normal in structure. No evidence of mitral valve regurgitation. No evidence of mitral stenosis.  4. The aortic valve is tricuspid. Aortic valve regurgitation is not visualized. No aortic stenosis is present.  5. The inferior vena cava is normal in size with greater than 50% respiratory variability, suggesting right atrial pressure of 3 mmHg. Comparison(s): No prior Echocardiogram. Conclusion(s)/Recommendation(s): Normal biventricular function without evidence of hemodynamically significant valvular heart disease. FINDINGS  Left Ventricle: Left ventricular ejection fraction, by estimation, is 65 to 70%. The left ventricle has normal function. The left ventricle has no regional wall motion abnormalities. The left ventricular internal cavity size was normal in size. There is  no left ventricular hypertrophy. Left ventricular diastolic parameters were normal. Right Ventricle: The right ventricular size is normal. No increase in right ventricular wall thickness. Right ventricular systolic function is normal. Tricuspid regurgitation signal is inadequate for assessing PA pressure. Left Atrium: Left atrial size was normal in size. Right Atrium: Right atrial size was normal in size. Pericardium: Trivial pericardial effusion is present. The pericardial effusion is posterior to the left ventricle. Mitral Valve: The mitral valve is normal in structure. No evidence of mitral valve regurgitation. No evidence of mitral valve stenosis. Tricuspid Valve: The tricuspid valve is normal in structure. Tricuspid valve regurgitation is not demonstrated. No evidence of tricuspid stenosis. Aortic Valve: The aortic valve is tricuspid. Aortic valve regurgitation is not visualized. No aortic stenosis is present. Pulmonic Valve: The pulmonic valve was normal in structure. Pulmonic valve regurgitation is not visualized. No evidence of pulmonic  stenosis. Aorta: The aortic root, ascending aorta and aortic arch are all structurally normal, with no evidence of dilitation or obstruction. Venous: The inferior vena cava is normal in size with greater than 50% respiratory variability, suggesting right atrial pressure of 3 mmHg. IAS/Shunts: No atrial level shunt detected by color flow Doppler.  LEFT VENTRICLE PLAX 2D LVIDd:         4.00 cm   Diastology LVIDs:         2.50 cm   LV e' medial:    10.90 cm/s LV PW:         0.90 cm   LV E/e' medial:  8.4 LV IVS:        0.80 cm   LV e' lateral:   15.20 cm/s LVOT diam:     1.90 cm   LV E/e' lateral: 6.0 LV SV:         46 LV SV Index:   24 LVOT Area:     2.84 cm  RIGHT VENTRICLE             IVC RV S prime:     14.30 cm/s  IVC diam: 1.30 cm TAPSE (M-mode): 2.4 cm LEFT ATRIUM             Index        RIGHT ATRIUM           Index LA diam:        2.90 cm 1.53 cm/m   RA  Area:     11.40 cm LA Vol (A2C):   22.6 ml 11.96 ml/m  RA Volume:   25.00 ml  13.23 ml/m LA Vol (A4C):   36.1 ml 19.11 ml/m LA Biplane Vol: 31.1 ml 16.46 ml/m  AORTIC VALVE LVOT Vmax:   111.00 cm/s LVOT Vmean:  71.200 cm/s LVOT VTI:    0.163 m  AORTA Ao Root diam: 3.00 cm Ao Asc diam:  3.00 cm MITRAL VALVE                TRICUSPID VALVE MV Area (PHT): 5.54 cm     TR Peak grad:   6.6 mmHg MV Decel Time: 137 msec     TR Vmax:        128.00 cm/s MV E velocity: 91.50 cm/s MV A velocity: 106.00 cm/s  SHUNTS MV E/A ratio:  0.86         Systemic VTI:  0.16 m                             Systemic Diam: 1.90 cm Riley Lam MD Electronically signed by Riley Lam MD Signature Date/Time: 04/01/2023/10:34:21 AM    Final    DG Abd Portable 1V  Result Date: 04/01/2023 CLINICAL DATA:  Upset stomach. EXAM: PORTABLE ABDOMEN - 1 VIEW COMPARISON:  None Available. FINDINGS: No gaseous bowel dilatation. No radio-opaque calculi or other significant radiographic abnormality are seen. IMPRESSION: Negative. CT imaging could be used to further evaluate as  clinically warranted. Electronically Signed   By: Kennith Center M.D.   On: 04/01/2023 06:17   DG Chest 1 View  Result Date: 03/31/2023 CLINICAL DATA:  DKA EXAM: CHEST  1 VIEW COMPARISON:  02/02/2023 FINDINGS: Low lung volumes. Lungs are essentially clear. No pleural effusion or pneumothorax. The heart is normal in size. IMPRESSION: No acute cardiopulmonary disease. Electronically Signed   By: Charline Bills M.D.   On: 03/31/2023 18:05       The results of significant diagnostics from this hospitalization (including imaging, microbiology, ancillary and laboratory) are listed below for reference.     Microbiology: Recent Results (from the past 240 hour(s))  MRSA Next Gen by PCR, Nasal     Status: Abnormal   Collection Time: 04/04/23  6:29 PM   Specimen: Nasal Mucosa; Nasal Swab  Result Value Ref Range Status   MRSA by PCR Next Gen DETECTED (A) NOT DETECTED Final    Comment: RESULT CALLED TO, READ BACK BY AND VERIFIED WITH: RN ANormand Sloop [id number] @[phone]  FH (NOTE) The GeneXpert MRSA Assay (FDA approved for NASAL specimens only), is one component of a comprehensive MRSA colonization surveillance program. It is not intended to diagnose MRSA infection nor to guide or monitor treatment for MRSA infections. Test performance is not FDA approved in patients less than 22 years old. Performed at [facility], [address] [place], [place] [address]      Labs:  CBC: Recent Labs  Lab 03/31/23 1620 03/31/23 1632 03/31/23 2136 04/01/23 0554 04/02/23 0205 04/04/23 1335 04/04/23 1404 04/04/23 2326  WBC 9.0  --  11.6* 10.6* 8.0 6.6  --  7.8  NEUTROABS 7.5  --   --   --  3.8 4.9  --   --   HGB 13.2   < > 11.9* 11.7* 10.7* 13.0 13.6 11.6*  HCT 38.4   < > 35.6* 35.0* 31.8* 37.3 40.0 33.7*  MCV 88.9  --  89.0  89.5 88.6 88.0  --  88.7  PLT 329  --  294 302 286 327  --  297   < > = values in this interval not displayed.   BMP &GFR Recent Labs  Lab 03/31/23 1539 03/31/23 1620  04/01/23 0554 04/02/23 0205 04/04/23 1335 04/04/23 1404 04/04/23 2326 04/05/23 0438 04/05/23 1130  NA  --    < > 136 135 134* 135 137 138 134*  K  --    < > 3.5 3.0* 4.2 4.3 3.3* 3.0* 3.6  CL  --    < > 99 100 93*  --  98 98 97*  CO2  --    < > 23 24 26   --  28 28 26   GLUCOSE  --    < > 159* 171* 405*  --  191* 160* 275*  BUN  --    < > 10 5* 5*  --  6 <5* 6  CREATININE  --    < > 0.84 0.75 0.84  --  0.77 0.70 0.79  CALCIUM  --    < > 9.1 8.5* 9.3  --  9.0 8.9 8.6*  MG 1.9  --  1.9 1.9  --   --   --  1.7  --   PHOS 2.2*  --   --   --   --   --   --   --  3.2   < > = values in this interval not displayed.   Estimated Creatinine Clearance: 103.3 mL/min (by C-G formula based on SCr of 0.79 mg/dL). Liver & Pancreas: Recent Labs  Lab 03/31/23 1620 04/04/23 1335 04/04/23 2326 04/05/23 1130  AST 15 362* 131*  --   ALT 16 344* 246*  --   ALKPHOS 98 272* 214*  --   BILITOT 0.7 1.1 0.7  --   PROT 7.7 7.2 6.3*  --   ALBUMIN 3.9 3.5 3.1* 2.9*   Recent Labs  Lab 03/31/23 1620 04/01/23 0554 04/04/23 1335  LIPASE 24 17 21    No results for input(s): "AMMONIA" in the last 168 hours. Diabetic: No results for input(s): "HGBA1C" in the last 72 hours. Recent Labs  Lab 04/05/23 0207 04/05/23 0256 04/05/23 0359 04/05/23 0645 04/05/23 1146  GLUCAP 218* 201* 159* 193* 245*   Cardiac Enzymes: No results for input(s): "CKTOTAL", "CKMB", "CKMBINDEX", "TROPONINI" in the last 168 hours. No results for input(s): "PROBNP" in the last 8760 hours. Coagulation Profile: No results for input(s): "INR", "PROTIME" in the last 168 hours. Thyroid Function Tests: No results for input(s): "TSH", "T4TOTAL", "FREET4", "T3FREE", "THYROIDAB" in the last 72 hours. Lipid Profile: No results for input(s): "CHOL", "HDL", "LDLCALC", "TRIG", "CHOLHDL", "LDLDIRECT" in the last 72 hours. Anemia Panel: No results for input(s): "VITAMINB12", "FOLATE", "FERRITIN", "TIBC", "IRON", "RETICCTPCT" in the last 72  hours. Urine analysis:    Component Value Date/Time   COLORURINE YELLOW 04/04/2023 1322   APPEARANCEUR CLEAR 04/04/2023 1322   LABSPEC 1.021 04/04/2023 1322   PHURINE 8.0 04/04/2023 1322   GLUCOSEU >=500 (A) 04/04/2023 1322   HGBUR LARGE (A) 04/04/2023 1322   BILIRUBINUR NEGATIVE 04/04/2023 1322   BILIRUBINUR negative 11/08/2021 1424   KETONESUR 20 (A) 04/04/2023 1322   PROTEINUR 30 (A) 04/04/2023 1322   UROBILINOGEN 0.2 11/08/2021 1424   UROBILINOGEN 0.2 05/03/2021 0837   NITRITE NEGATIVE 04/04/2023 1322   LEUKOCYTESUR NEGATIVE 04/04/2023 1322   Sepsis Labs: Invalid input(s): "PROCALCITONIN", "LACTICIDVEN"   SIGNED:  Almon Hercules, MD  [facility] 04/05/2023, 4:07 PM

## 2023-04-05 NOTE — Progress Notes (Signed)
DKA-resolved Uncontrolled DM type I with chronic insulin noncompliance  BMP at 4:40 AM showed that bicarb 28 and anion gap 12.  Per chart review and lab check bicarb level 26-28 and anion gap 11-12 in last 8 hours.  Patient already has been transitioned to subcu insulin.  Informed bedside nurse to stop the insulin drip and D5W. At home patient is on Levemir 15 unit twice daily and sliding scale SSI as needed with meal and continue POC blood could check 4 times daily with meal and bedtime. - Starting clear liquid diet.  Plan to continue Levemir 15 unit twice daily and continue low sliding scale SSI as needed with mealtime coverage. -Persistently low potassium.  BMP showed potassium 3.  Repleted with oral KCl oral. -Repeat BMP at 9 AM.   Tereasa Coop, MD [facility] 04/05/2023, 5:38 AM

## 2023-04-06 ENCOUNTER — Other Ambulatory Visit: Payer: Self-pay

## 2023-04-06 ENCOUNTER — Other Ambulatory Visit: Payer: Self-pay | Admitting: Student

## 2023-04-06 ENCOUNTER — Emergency Department (HOSPITAL_BASED_OUTPATIENT_CLINIC_OR_DEPARTMENT_OTHER)
Admission: EM | Admit: 2023-04-06 | Discharge: 2023-04-06 | Discharge status: Against Medical Advice | Payer: BC Managed Care – PPO | Attending: Emergency Medicine | Admitting: Emergency Medicine

## 2023-04-06 ENCOUNTER — Encounter (HOSPITAL_BASED_OUTPATIENT_CLINIC_OR_DEPARTMENT_OTHER): Payer: Self-pay | Admitting: Emergency Medicine

## 2023-04-06 DIAGNOSIS — E1065 Type 1 diabetes mellitus with hyperglycemia: Secondary | ICD-10-CM

## 2023-04-06 DIAGNOSIS — E1165 Type 2 diabetes mellitus with hyperglycemia: Secondary | ICD-10-CM | POA: Insufficient documentation

## 2023-04-06 DIAGNOSIS — R55 Syncope and collapse: Secondary | ICD-10-CM | POA: Diagnosis present

## 2023-04-06 DIAGNOSIS — R000 Tachycardia, unspecified: Secondary | ICD-10-CM | POA: Insufficient documentation

## 2023-04-06 DIAGNOSIS — Z794 Long term (current) use of insulin: Secondary | ICD-10-CM | POA: Diagnosis not present

## 2023-04-06 DIAGNOSIS — Z5329 Procedure and treatment not carried out because of patient's decision for other reasons: Secondary | ICD-10-CM | POA: Insufficient documentation

## 2023-04-06 DIAGNOSIS — R1013 Epigastric pain: Secondary | ICD-10-CM | POA: Diagnosis not present

## 2023-04-06 LAB — CBC WITH DIFFERENTIAL/PLATELET
Abs Immature Granulocytes: 0.04 10*3/uL (ref 0.00–0.07)
Basophils Absolute: 0 10*3/uL (ref 0.0–0.1)
Basophils Relative: 0 %
Eosinophils Absolute: 0.2 10*3/uL (ref 0.0–0.5)
Eosinophils Relative: 2 %
HCT: 34.6 % — ABNORMAL LOW (ref 36.0–46.0)
Hemoglobin: 11.9 g/dL — ABNORMAL LOW (ref 12.0–15.0)
Immature Granulocytes: 1 %
Lymphocytes Relative: 23 %
Lymphs Abs: 1.9 10*3/uL (ref 0.7–4.0)
MCH: 30 pg (ref 26.0–34.0)
MCHC: 34.4 g/dL (ref 30.0–36.0)
MCV: 87.2 fL (ref 80.0–100.0)
Monocytes Absolute: 0.6 10*3/uL (ref 0.1–1.0)
Monocytes Relative: 7 %
Neutro Abs: 5.6 10*3/uL (ref 1.7–7.7)
Neutrophils Relative %: 67 %
Platelets: 338 10*3/uL (ref 150–400)
RBC: 3.97 MIL/uL (ref 3.87–5.11)
RDW: 13.1 % (ref 11.5–15.5)
WBC: 8.3 10*3/uL (ref 4.0–10.5)
nRBC: 0 % (ref 0.0–0.2)

## 2023-04-06 LAB — COMPREHENSIVE METABOLIC PANEL
ALT: 132 U/L — ABNORMAL HIGH (ref 0–44)
AST: 34 U/L (ref 15–41)
Albumin: 4 g/dL (ref 3.5–5.0)
Alkaline Phosphatase: 165 U/L — ABNORMAL HIGH (ref 38–126)
Anion gap: 10 (ref 5–15)
BUN: <5 mg/dL — ABNORMAL LOW (ref 6–20)
CO2: 28 mmol/L (ref 22–32)
Calcium: 9.5 mg/dL (ref 8.9–10.3)
Chloride: 100 mmol/L (ref 98–111)
Creatinine, Ser: 0.81 mg/dL (ref 0.44–1.00)
GFR, Estimated: >60 mL/min (ref >60)
Glucose, Bld: 173 mg/dL — ABNORMAL HIGH (ref 70–99)
Potassium: 3.6 mmol/L (ref 3.5–5.1)
Sodium: 138 mmol/L (ref 135–145)
Total Bilirubin: 0.4 mg/dL (ref 0.3–1.2)
Total Protein: 7.3 g/dL (ref 6.5–8.1)

## 2023-04-06 LAB — I-STAT VENOUS BLOOD GAS, ED
Acid-Base Excess: 3 mmol/L — ABNORMAL HIGH (ref 0.0–2.0)
Bicarbonate: 28.2 mmol/L — ABNORMAL HIGH (ref 20.0–28.0)
Calcium, Ion: 1.22 mmol/L (ref 1.15–1.40)
HCT: 34 % — ABNORMAL LOW (ref 36.0–46.0)
Hemoglobin: 11.6 g/dL — ABNORMAL LOW (ref 12.0–15.0)
O2 Saturation: 57 %
Patient temperature: 99.2
Potassium: 3.5 mmol/L (ref 3.5–5.1)
Sodium: 137 mmol/L (ref 135–145)
TCO2: 30 mmol/L (ref 22–32)
pCO2, Ven: 44.1 mmHg (ref 44–60)
pH, Ven: 7.416 (ref 7.25–7.43)
pO2, Ven: 30 mmHg — CL (ref 32–45)

## 2023-04-06 LAB — TROPONIN I (HIGH SENSITIVITY): Troponin I (High Sensitivity): 3 ng/L (ref <18)

## 2023-04-06 LAB — LIPASE, BLOOD: Lipase: <10 U/L — ABNORMAL LOW (ref 11–51)

## 2023-04-06 LAB — CBG MONITORING, ED: Glucose-Capillary: 181 mg/dL — ABNORMAL HIGH (ref 70–99)

## 2023-04-06 MED ORDER — DIPHENHYDRAMINE HCL 50 MG/ML IJ SOLN
25.0000 mg | Freq: Once | INTRAMUSCULAR | Status: AC
  Administered 2023-04-06: 25 mg via INTRAVENOUS
  Filled 2023-04-06: qty 1

## 2023-04-06 MED ORDER — DROPERIDOL 2.5 MG/ML IJ SOLN
1.2500 mg | Freq: Once | INTRAMUSCULAR | Status: AC
  Administered 2023-04-06: 1.25 mg via INTRAVENOUS
  Filled 2023-04-06: qty 2

## 2023-04-06 MED ORDER — LACTATED RINGERS IV BOLUS
2000.0000 mL | Freq: Once | INTRAVENOUS | Status: AC
  Administered 2023-04-06: 2000 mL via INTRAVENOUS

## 2023-04-06 MED ORDER — MORPHINE SULFATE (PF) 4 MG/ML IV SOLN
4.0000 mg | Freq: Once | INTRAVENOUS | Status: AC
  Administered 2023-04-06: 4 mg via INTRAVENOUS
  Filled 2023-04-06: qty 1

## 2023-04-06 MED ORDER — LANCET DEVICE MISC
1.0000 | Freq: Three times a day (TID) | 0 refills | Status: DC
Start: 2023-04-06 — End: 2023-05-07

## 2023-04-06 MED ORDER — BLOOD GLUCOSE TEST VI STRP: 1.0000 | ORAL_STRIP | Freq: Three times a day (TID) | 0 refills | Status: AC

## 2023-04-06 MED ORDER — BLOOD GLUCOSE MONITORING SUPPL DEVI
1.0000 | Freq: Three times a day (TID) | 0 refills | Status: DC
Start: 2023-04-06 — End: 2023-08-06

## 2023-04-06 MED ORDER — LANCETS MISC. MISC
1.0000 | Freq: Three times a day (TID) | 0 refills | Status: DC
Start: 2023-04-06 — End: 2023-05-07

## 2023-04-06 NOTE — ED Notes (Signed)
Patient left AMA.  Left before paperwork was printed and before signing AMA form.  PIV removed at her request.

## 2023-04-06 NOTE — Discharge Instructions (Signed)
I did not find an obvious cause for your event today.  Please follow-up with your family doctor in the office.  Please return anytime he would like to be reevaluated.

## 2023-04-06 NOTE — ED Triage Notes (Signed)
Pt presents to ED POV. Pt c/o multiple episodes of LOC today. Rn called out to car pt non-responsive to verbal or painful in car upon RN arrival. Pt became responsive when moved over to bed. In hospital yesterday for diabetes began feeling worse this morning. But denies hitting head

## 2023-04-06 NOTE — ED Provider Notes (Signed)
[place] EMERGENCY DEPARTMENT AT [facility] Provider Note   CSN: [id number] Arrival date & time: 04/06/23  1646     History  Chief Complaint  Patient presents with   Loss of Consciousness    [name] is a 37 y.o. female.  37 yo F with a chief complaints of not feeling well.  She tells me that she feels like she does when she has diabetic ketoacidosis.  Has mostly epigastric abdominal pain feels very fatigued.  Family states that she is lost consciousness multiple times today.  Was reportedly unresponsive in her car when she arrived and was brought urgently to the resuscitation room here.  Patient does not remember any of that.  Last thing she remembers she had an elevated blood sugar and was taking a dose of insulin.  She was just admitted to the hospital for diabetic ketoacidosis.  She is complaining of some epigastric discomfort.   Loss of Consciousness      Home Medications Prior to Admission medications   Medication Sig Start Date End Date Taking? Authorizing Provider  amitriptyline (ELAVIL) 25 MG tablet Take 75 mg by mouth at bedtime. 06/26/22   [provider]  Blood Glucose Monitoring Suppl DEVI 1 each by Does not apply route in the morning, at noon, and at bedtime. May substitute based on patient's preference and/or insurance. 04/06/23   Almon Hercules, MD  Continuous Blood Gluc Transmit (DEXCOM G6 TRANSMITTER) MISC Inject 1 each into the skin every 14 (fourteen) days. 12/31/21   Dulce Sellar, NP  cyanocobalamin (VITAMIN B12) 1000 MCG/ML injection Inject 1,000 mcg into the muscle See admin instructions. 1000 mcg injected IM every Tuesday. 08/31/22   [provider]  DULoxetine (CYMBALTA) 60 MG capsule Take 60 mg by mouth daily. 06/28/22   [provider]  Glucose Blood (BLOOD GLUCOSE TEST STRIPS) STRP 1 each by In Vitro route in the morning, at noon, and at bedtime. May substitute based on patient's preference and/or  insurance. 04/06/23 05/09/23  Almon Hercules, MD  insulin aspart (NOVOLOG) 100 UNIT/ML FlexPen Inject 0-18 Units into the skin 3 (three) times daily with meals. Check Blood Glucose (BG) and inject per scale: BG <150= 0 unit; BG 150-200= 3 unit; BG 201-250= 6 unit; BG 251-300= 9 unit; BG 301-350= 12 unit; BG 351-400= 15 unit; BG >400= 18 unit and Call Primary Care. 02/04/23 05/05/23  Dahal, Melina Schools, MD  insulin detemir (LEVEMIR FLEXTOUCH) 100 UNIT/ML FlexPen Inject 15 Units into the skin 2 (two) times daily. Patient taking differently: Inject 60 Units into the skin at bedtime. 02/04/23   Lorin Glass, MD  Insulin Pen Needle (B-D UF III MINI PEN NEEDLES) 31G X 5 MM MISC To use with Insulin pens, as directed 11/20/21   Dulce Sellar, NP  Insulin Syringes, Disposable, U-100 1 ML MISC Inject 1 each into the skin in the morning and at bedtime. 08/10/21   Dulce Sellar, NP  Lancet Device MISC 1 each by Does not apply route in the morning, at noon, and at bedtime. May substitute based on patient's preference and/or insurance. 04/06/23 05/06/23  Almon Hercules, MD  Lancets Misc. MISC 1 each by Does not apply route in the morning, at noon, and at bedtime. May substitute based on patient's preference and/or insurance. 04/06/23 05/06/23  Almon Hercules, MD  metoCLOPramide (REGLAN) 10 MG tablet Take 1 tablet (10 mg total) by mouth 3 (three) times daily with meals for 14 days. 02/04/23 05/31/23  Dahal,  Melina Schools, MD  oxyCODONE (ROXICODONE) 15 MG immediate release tablet Take 15 mg by mouth every 6 (six) hours as needed for pain.    [provider]  pantoprazole (PROTONIX) 40 MG tablet Take 1 tablet (40 mg total) by mouth 2 (two) times daily. 04/05/23   Almon Hercules, MD  phentermine (ADIPEX-P) 37.5 MG tablet Take 37.5 mg by mouth daily.    [provider]      Allergies    Mirapex [pramipexole]    Review of Systems   Review of Systems  Cardiovascular:  Positive for syncope.    Physical Exam Updated  Vital Signs BP (!) 149/102   Pulse (!) 115   Temp 99.2 F (37.3 C)   Resp (!) 24   LMP 04/01/2023   SpO2 98%  Physical Exam  ED Results / Procedures / Treatments   Labs (all labs ordered are listed, but only abnormal results are displayed) Labs Reviewed  CBC WITH DIFFERENTIAL/PLATELET - Abnormal; Notable for the following components:      Result Value   Hemoglobin 11.9 (*)    HCT 34.6 (*)    All other components within normal limits  COMPREHENSIVE METABOLIC PANEL - Abnormal; Notable for the following components:   Glucose, Bld 173 (*)    BUN <5 (*)    ALT 132 (*)    Alkaline Phosphatase 165 (*)    All other components within normal limits  LIPASE, BLOOD - Abnormal; Notable for the following components:   Lipase <10 (*)    All other components within normal limits  I-STAT VENOUS BLOOD GAS, ED - Abnormal; Notable for the following components:   pO2, Ven 30 (*)    Bicarbonate 28.2 (*)    Acid-Base Excess 3.0 (*)    HCT 34.0 (*)    Hemoglobin 11.6 (*)    All other components within normal limits  BETA-HYDROXYBUTYRIC ACID  URINALYSIS, ROUTINE W REFLEX MICROSCOPIC  PREGNANCY, URINE  TROPONIN I (HIGH SENSITIVITY)    EKG EKG Interpretation Date/Time:  Sunday April 06 2023 16:51:45 EDT Ventricular Rate:  122 PR Interval:  124 QRS Duration:  66 QT Interval:  301 QTC Calculation: 429 R Axis:   81  Text Interpretation: Sinus tachycardia Atrial premature complex Probable left atrial enlargement No significant change since last tracing Confirmed by Melene Plan [phone]) on 04/06/2023 5:20:31 PM  Radiology No results found.  Procedures Procedures    Medications Ordered in ED Medications  lactated ringers bolus 2,000 mL (2,000 mLs Intravenous New Bag/Given 04/06/23 1703)  droperidol (INAPSINE) 2.5 MG/ML injection 1.25 mg (1.25 mg Intravenous Given 04/06/23 1707)  diphenhydrAMINE (BENADRYL) injection 25 mg (25 mg Intravenous Given 04/06/23 1707)  morphine (PF) 4 MG/ML  injection 4 mg (4 mg Intravenous Given 04/06/23 1707)    ED Course/ Medical Decision Making/ A&P                                 Medical Decision Making Amount and/or Complexity of Data Reviewed Labs: ordered.  Risk Prescription drug management.   37 yo F with a chief complaints of not feeling well.  The patient was just hospitalized and discharged yesterday for diabetic ketoacidosis.  She is worried that maybe this is reoccurred.  She was brought in by nursing staff here and reportedly was completely unresponsive.  By the time of my evaluation the patient is awake and alert and has no specific complaints.  She  does stare off into space at times has no obvious ectopy on her monitor.  Sinus tachycardia which seems consistent with her prior visits.  Will give a bolus of IV fluids here.  Lab work.  Reassess.  6:00 PM I was notified by nursing that the patient would like to leave.  I went to the bedside and the patient tells me that she feels much better.  She still tachycardic.  No other concerning events on the monitor.  Not in diabetic ketoacidosis by blood work here.  No anion gap, no acidosis.  She does have some outstanding tests still waiting, including a troponin.  I was considering the possibility of a pulmonary embolism with a tachycardia though patient has been quite tachycardic at each of her visits.  I discussed risks of leaving, she tells me she has an emergency and will return if she feels worse.  Encouraged her to follow-up with her family doctor.  6:00 PM:  I have discussed the diagnosis/risks/treatment options with the patient.  Evaluation and diagnostic testing in the emergency department does not suggest an emergent condition requiring admission or immediate intervention beyond what has been performed at this time.  They will follow up with PCP. We also discussed returning to the ED immediately if new or worsening sx occur. We discussed the sx which are most concerning (e.g.,  sudden worsening pain, fever, inability to tolerate by mouth, recurrent syncope, hemoptysis) that necessitate immediate return. Medications administered to the patient during their visit and any new prescriptions provided to the patient are listed below.  Medications given during this visit Medications  lactated ringers bolus 2,000 mL (2,000 mLs Intravenous New Bag/Given 04/06/23 1703)  droperidol (INAPSINE) 2.5 MG/ML injection 1.25 mg (1.25 mg Intravenous Given 04/06/23 1707)  diphenhydrAMINE (BENADRYL) injection 25 mg (25 mg Intravenous Given 04/06/23 1707)  morphine (PF) 4 MG/ML injection 4 mg (4 mg Intravenous Given 04/06/23 1707)     The patient appears reasonably screen and/or stabilized for discharge and I doubt any other medical condition or other [facility] requiring further screening, evaluation, or treatment in the ED at this time prior to discharge.           Final Clinical Impression(s) / ED Diagnoses Final diagnoses:  Syncope and collapse    Rx / DC Orders ED Discharge Orders     None         Melene Plan, DO 04/06/23 1800

## 2023-04-06 NOTE — Progress Notes (Signed)
Patient didn't go home with printed Rx for diabetic medical supplies. Sent electronically to her [facility] pharmacy per request

## 2023-04-10 ENCOUNTER — Emergency Department (HOSPITAL_BASED_OUTPATIENT_CLINIC_OR_DEPARTMENT_OTHER)
Admission: EM | Admit: 2023-04-10 | Discharge: 2023-04-10 | Disposition: A | Discharge status: Home / Self Care | Payer: BC Managed Care – PPO | Attending: Emergency Medicine | Admitting: Emergency Medicine

## 2023-04-10 ENCOUNTER — Other Ambulatory Visit: Payer: Self-pay

## 2023-04-10 DIAGNOSIS — R112 Nausea with vomiting, unspecified: Secondary | ICD-10-CM | POA: Diagnosis not present

## 2023-04-10 DIAGNOSIS — Z794 Long term (current) use of insulin: Secondary | ICD-10-CM | POA: Insufficient documentation

## 2023-04-10 DIAGNOSIS — E1065 Type 1 diabetes mellitus with hyperglycemia: Secondary | ICD-10-CM | POA: Insufficient documentation

## 2023-04-10 DIAGNOSIS — R739 Hyperglycemia, unspecified: Secondary | ICD-10-CM

## 2023-04-10 DIAGNOSIS — R1084 Generalized abdominal pain: Secondary | ICD-10-CM | POA: Insufficient documentation

## 2023-04-10 DIAGNOSIS — R000 Tachycardia, unspecified: Secondary | ICD-10-CM | POA: Diagnosis not present

## 2023-04-10 LAB — BASIC METABOLIC PANEL
Anion gap: 12 (ref 5–15)
BUN: 8 mg/dL (ref 6–20)
CO2: 28 mmol/L (ref 22–32)
Calcium: 9.3 mg/dL (ref 8.9–10.3)
Chloride: 98 mmol/L (ref 98–111)
Creatinine, Ser: 0.84 mg/dL (ref 0.44–1.00)
GFR, Estimated: >60 mL/min (ref >60)
Glucose, Bld: 209 mg/dL — ABNORMAL HIGH (ref 70–99)
Potassium: 3.5 mmol/L (ref 3.5–5.1)
Sodium: 138 mmol/L (ref 135–145)

## 2023-04-10 LAB — I-STAT VENOUS BLOOD GAS, ED
Acid-Base Excess: 6 mmol/L — ABNORMAL HIGH (ref 0.0–2.0)
Bicarbonate: 30.7 mmol/L — ABNORMAL HIGH (ref 20.0–28.0)
Calcium, Ion: 1.2 mmol/L (ref 1.15–1.40)
HCT: 36 % (ref 36.0–46.0)
Hemoglobin: 12.2 g/dL (ref 12.0–15.0)
O2 Saturation: 68 %
Potassium: 3.4 mmol/L — ABNORMAL LOW (ref 3.5–5.1)
Sodium: 138 mmol/L (ref 135–145)
TCO2: 32 mmol/L (ref 22–32)
pCO2, Ven: 46.1 mmHg (ref 44–60)
pH, Ven: 7.431 — ABNORMAL HIGH (ref 7.25–7.43)
pO2, Ven: 35 mmHg (ref 32–45)

## 2023-04-10 LAB — CBC WITH DIFFERENTIAL/PLATELET
Abs Immature Granulocytes: 0.06 10*3/uL (ref 0.00–0.07)
Basophils Absolute: 0 10*3/uL (ref 0.0–0.1)
Basophils Relative: 0 %
Eosinophils Absolute: 0 10*3/uL (ref 0.0–0.5)
Eosinophils Relative: 0 %
HCT: 35.5 % — ABNORMAL LOW (ref 36.0–46.0)
Hemoglobin: 12.2 g/dL (ref 12.0–15.0)
Immature Granulocytes: 1 %
Lymphocytes Relative: 16 %
Lymphs Abs: 1.9 10*3/uL (ref 0.7–4.0)
MCH: 30.4 pg (ref 26.0–34.0)
MCHC: 34.4 g/dL (ref 30.0–36.0)
MCV: 88.5 fL (ref 80.0–100.0)
Monocytes Absolute: 0.6 10*3/uL (ref 0.1–1.0)
Monocytes Relative: 5 %
Neutro Abs: 9.5 10*3/uL — ABNORMAL HIGH (ref 1.7–7.7)
Neutrophils Relative %: 78 %
Platelets: 395 10*3/uL (ref 150–400)
RBC: 4.01 MIL/uL (ref 3.87–5.11)
RDW: 13.4 % (ref 11.5–15.5)
WBC: 12.2 10*3/uL — ABNORMAL HIGH (ref 4.0–10.5)
nRBC: 0 % (ref 0.0–0.2)

## 2023-04-10 LAB — BETA-HYDROXYBUTYRIC ACID: Beta-Hydroxybutyric Acid: 0.12 mmol/L (ref 0.05–0.27)

## 2023-04-10 LAB — CBG MONITORING, ED: Glucose-Capillary: 129 mg/dL — ABNORMAL HIGH (ref 70–99)

## 2023-04-10 MED ORDER — LACTATED RINGERS IV BOLUS
20.0000 mL/kg | Freq: Once | INTRAVENOUS | Status: AC
  Administered 2023-04-10: 1588 mL via INTRAVENOUS

## 2023-04-10 MED ORDER — FENTANYL CITRATE PF 50 MCG/ML IJ SOSY
50.0000 ug | PREFILLED_SYRINGE | Freq: Once | INTRAMUSCULAR | Status: AC
  Administered 2023-04-10: 50 ug via INTRAVENOUS
  Filled 2023-04-10: qty 1

## 2023-04-10 MED ORDER — ONDANSETRON HCL 4 MG/2ML IJ SOLN
4.0000 mg | Freq: Once | INTRAMUSCULAR | Status: DC
  Filled 2023-04-10: qty 2

## 2023-04-10 MED ORDER — DROPERIDOL 2.5 MG/ML IJ SOLN
2.5000 mg | Freq: Once | INTRAMUSCULAR | Status: AC
  Administered 2023-04-10: 2.5 mg via INTRAVENOUS
  Filled 2023-04-10: qty 2

## 2023-04-10 NOTE — ED Notes (Signed)
Pt. Drank a cup of water with no difficulty for PO challenge

## 2023-04-10 NOTE — ED Provider Notes (Signed)
[place] EMERGENCY DEPARTMENT AT [facility] Provider Note   CSN: [id number] Arrival date & time: 04/10/23  1704     Chief Complaint  Patient presents with   Hyperglycemia    Mikel L Nyarai Waller is a 37 y.o. female with history of type 1 diabetes who presents to the emergency department with nausea, vomiting, abdominal pain, and elevated blood sugars.  Patient was just released from the hospital on 04/05/2023 after being admitted for DKA.  She states that she feels similar to this.  In addition, patient was seen evaluated on 8/18 for similar symptoms but was ultimately discharged home.  She states that she has been using sliding scale insulin and she has been having blood sugar spikes recently.  She has not had any recent illness.  Patient reports generalized associated abdominal burning but denies fever or chills.   Hyperglycemia      Home Medications Prior to Admission medications   Medication Sig Start Date End Date Taking? Authorizing Provider  amitriptyline (ELAVIL) 25 MG tablet Take 75 mg by mouth at bedtime. 06/26/22   [provider]  Blood Glucose Monitoring Suppl DEVI 1 each by Does not apply route in the morning, at noon, and at bedtime. May substitute based on patient's preference and/or insurance. 04/06/23   Almon Hercules, MD  Continuous Blood Gluc Transmit (DEXCOM G6 TRANSMITTER) MISC Inject 1 each into the skin every 14 (fourteen) days. 12/31/21   Dulce Sellar, NP  cyanocobalamin (VITAMIN B12) 1000 MCG/ML injection Inject 1,000 mcg into the muscle See admin instructions. 1000 mcg injected IM every Tuesday. 08/31/22   [provider]  DULoxetine (CYMBALTA) 60 MG capsule Take 60 mg by mouth daily. 06/28/22   [provider]  Glucose Blood (BLOOD GLUCOSE TEST STRIPS) STRP 1 each by In Vitro route in the morning, at noon, and at bedtime. May substitute based on patient's preference and/or insurance. 04/06/23 05/09/23  Almon Hercules, MD   insulin aspart (NOVOLOG) 100 UNIT/ML FlexPen Inject 0-18 Units into the skin 3 (three) times daily with meals. Check Blood Glucose (BG) and inject per scale: BG <150= 0 unit; BG 150-200= 3 unit; BG 201-250= 6 unit; BG 251-300= 9 unit; BG 301-350= 12 unit; BG 351-400= 15 unit; BG >400= 18 unit and Call Primary Care. 02/04/23 05/05/23  Dahal, Melina Schools, MD  insulin detemir (LEVEMIR FLEXTOUCH) 100 UNIT/ML FlexPen Inject 15 Units into the skin 2 (two) times daily. Patient taking differently: Inject 60 Units into the skin at bedtime. 02/04/23   Lorin Glass, MD  Insulin Pen Needle (B-D UF III MINI PEN NEEDLES) 31G X 5 MM MISC To use with Insulin pens, as directed 11/20/21   Dulce Sellar, NP  Insulin Syringes, Disposable, U-100 1 ML MISC Inject 1 each into the skin in the morning and at bedtime. 08/10/21   Dulce Sellar, NP  Lancet Device MISC 1 each by Does not apply route in the morning, at noon, and at bedtime. May substitute based on patient's preference and/or insurance. 04/06/23 05/06/23  Almon Hercules, MD  Lancets Misc. MISC 1 each by Does not apply route in the morning, at noon, and at bedtime. May substitute based on patient's preference and/or insurance. 04/06/23 05/06/23  Almon Hercules, MD  metoCLOPramide (REGLAN) 10 MG tablet Take 1 tablet (10 mg total) by mouth 3 (three) times daily with meals for 14 days. 02/04/23 05/31/23  Dahal, Melina Schools, MD  oxyCODONE (ROXICODONE) 15 MG immediate release tablet Take 15 mg by mouth  every 6 (six) hours as needed for pain.    [provider]  pantoprazole (PROTONIX) 40 MG tablet Take 1 tablet (40 mg total) by mouth 2 (two) times daily. 04/05/23   Almon Hercules, MD  phentermine (ADIPEX-P) 37.5 MG tablet Take 37.5 mg by mouth daily.    [provider]      Allergies    Mirapex [pramipexole]    Review of Systems   Review of Systems  All other systems reviewed and are negative.   Physical Exam Updated Vital Signs LMP 04/01/2023  Physical  Exam Vitals and nursing note reviewed.  Constitutional:      General: She is not in acute distress.    Appearance: Normal appearance.  HENT:     Head: Normocephalic and atraumatic.  Eyes:     General:        Right eye: No discharge.        Left eye: No discharge.  Cardiovascular:     Rate and Rhythm: Regular rhythm. Tachycardia present.     Pulses: No decreased pulses.     Heart sounds: Normal heart sounds.  Pulmonary:     Comments: Clear to auscultation bilaterally.  Normal effort.  No respiratory distress.  No evidence of wheezes, rales, or rhonchi heard throughout. Abdominal:     General: Abdomen is flat. Bowel sounds are normal. There is no distension.     Tenderness: There is no abdominal tenderness. There is no guarding or rebound.  Musculoskeletal:        General: Normal range of motion.     Cervical back: Neck supple.     Right lower leg: No edema.     Left lower leg: No edema.  Skin:    General: Skin is warm and dry.     Findings: No rash.  Neurological:     General: No focal deficit present.     Mental Status: She is alert.  Psychiatric:        Mood and Affect: Mood normal.        Behavior: Behavior normal.     ED Results / Procedures / Treatments   Labs (all labs ordered are listed, but only abnormal results are displayed) Labs Reviewed  PREGNANCY, URINE  CBC WITH DIFFERENTIAL/PLATELET  URINALYSIS, ROUTINE W REFLEX MICROSCOPIC  BASIC METABOLIC PANEL  BETA-HYDROXYBUTYRIC ACID  CBG MONITORING, ED  I-STAT VENOUS BLOOD GAS, ED    EKG None  Radiology No results found.  Procedures Procedures    Medications Ordered in ED Medications  lactated ringers bolus 1,588 mL (has no administration in time range)    ED Course/ Medical Decision Making/ A&P Clinical Course as of 04/10/23 1904  Thu Apr 10, 2023  1813 CBC with Differential (PNL)(!) There is evidence of leukocytosis. Otherwise normal.  [CF]  1825 Basic metabolic panel(!) Normal. No anion  gap.  [CF]  1900 I-Stat venous blood gas, ED(!) Compensated metabolic acidosis although slightly alkalotic.  [CF]    Clinical Course User Index [CF] Teressa Lower, PA-C   {   Click here for ABCD2, HEART and other calculators  Medical Decision Making Salam L Manola Brumfield is a 37 y.o. female patient who presents to the emergency permit today for further evaluation of possible DKA.  Patient is tachycardic and slightly diaphoretic with some nausea and vomiting.  Not currently having any vomiting here in the emergency department.  Considering her recent admissions for DKA I will plan to get labs to include beta-hydroxybutyrate, VBG,  basic labs, CBG.  I will give her some fluids, pain medication and antiemetics.  Will also get an EKG considering her tachycardia and plan to reassess frequently.  Patient does not appear to be in DKA at this time.  She has a normal anion gap and is not acidotic.  Beta-hydroxybutyrate is still pending. Due to shift change, the rest of her care will be transferred to oncoming provider where ultimate disposition will be made. If patient is able to tolerate p.o. and all of her labs look better and patient is overall feeling well I do anticipate that she could be discharged.  If patient is unable to tolerate p.o. and is still having intractable nausea vomiting she may need to be made for additional fluid management.   Amount and/or Complexity of Data Reviewed Labs: ordered. Decision-making details documented in ED Course.  Risk Prescription drug management.    Final Clinical Impression(s) / ED Diagnoses Final diagnoses:  None    Rx / DC Orders ED Discharge Orders     None         Teressa Lower, PA-C 04/10/23 1907    Rozelle Logan, DO 04/10/23 2237

## 2023-04-10 NOTE — Discharge Instructions (Signed)
Continue your regular medications to control blood sugar. Please call your doctor tomorrow for recheck appointment. Return to the ED as needed for uncontrolled symptoms.

## 2023-04-10 NOTE — ED Notes (Signed)
 RT Note: VBG completed

## 2023-04-10 NOTE — ED Provider Notes (Signed)
  Physical Exam  LMP 04/01/2023   Physical Exam Sleeping initially, easy to waken, in NAD on waking.   Procedures  Procedures  ED Course / MDM   Clinical Course as of 04/10/23 1901  Thu Apr 10, 2023  1813 CBC with Differential (PNL)(!) There is evidence of leukocytosis. Otherwise normal.  [CF]  1825 Basic metabolic panel(!) Normal. No anion gap.  [CF]  1900 I-Stat venous blood gas, ED(!) Compensated metabolic acidosis although slightly alkalotic.  [CF]    Clinical Course User Index [CF] Teressa Lower, PA-C   Medical Decision Making Amount and/or Complexity of Data Reviewed Labs: ordered. Decision-making details documented in ED Course.  Risk Prescription drug management.   Out of hospital 8/17 secondary DKA with multiple admissions for same. Sugars 300's "typical". N, V today.  Droperidol provided with relief. On review/recheck, "I feel so much better".    Plan: Beta-hydroxy review. Likely discharge home if PO challenge passed. CBG here 209  PO challenge without vomiting or complaint. Ok to discharge per plan of previous treatment team.        Elpidio Anis, PA-C 04/10/23 2122    Horton, Clabe Seal, DO 04/10/23 2239

## 2023-04-10 NOTE — ED Triage Notes (Signed)
Arrives POV. Lethargic in back seat of car. Assisted to wheelchair and to room 16 without incident. Pt alert and oriented in bed. Reports CBG >300 at home. Took 8 units insulin and ate. Began throwing up food she ate shortly after. CBG 208 on arrival to room- see blank note.

## 2023-04-11 ENCOUNTER — Other Ambulatory Visit: Payer: Self-pay

## 2023-04-11 ENCOUNTER — Other Ambulatory Visit (HOSPITAL_BASED_OUTPATIENT_CLINIC_OR_DEPARTMENT_OTHER): Payer: Self-pay

## 2023-04-11 ENCOUNTER — Emergency Department (HOSPITAL_BASED_OUTPATIENT_CLINIC_OR_DEPARTMENT_OTHER)
Admission: EM | Admit: 2023-04-11 | Discharge: 2023-04-11 | Disposition: A | Discharge status: Home / Self Care | Payer: BC Managed Care – PPO | Attending: Emergency Medicine | Admitting: Emergency Medicine

## 2023-04-11 DIAGNOSIS — Z794 Long term (current) use of insulin: Secondary | ICD-10-CM | POA: Insufficient documentation

## 2023-04-11 DIAGNOSIS — R739 Hyperglycemia, unspecified: Secondary | ICD-10-CM | POA: Diagnosis present

## 2023-04-11 DIAGNOSIS — R1084 Generalized abdominal pain: Secondary | ICD-10-CM

## 2023-04-11 DIAGNOSIS — E1165 Type 2 diabetes mellitus with hyperglycemia: Secondary | ICD-10-CM | POA: Diagnosis not present

## 2023-04-11 DIAGNOSIS — K3184 Gastroparesis: Secondary | ICD-10-CM

## 2023-04-11 DIAGNOSIS — R000 Tachycardia, unspecified: Secondary | ICD-10-CM | POA: Diagnosis not present

## 2023-04-11 LAB — I-STAT VENOUS BLOOD GAS, ED
Acid-Base Excess: 7 mmol/L — ABNORMAL HIGH (ref 0.0–2.0)
Bicarbonate: 32.2 mmol/L — ABNORMAL HIGH (ref 20.0–28.0)
Calcium, Ion: 1.19 mmol/L (ref 1.15–1.40)
HCT: 37 % (ref 36.0–46.0)
Hemoglobin: 12.6 g/dL (ref 12.0–15.0)
O2 Saturation: 72 %
Patient temperature: 99
Potassium: 4 mmol/L (ref 3.5–5.1)
Sodium: 135 mmol/L (ref 135–145)
TCO2: 34 mmol/L — ABNORMAL HIGH (ref 22–32)
pCO2, Ven: 46.2 mmHg (ref 44–60)
pH, Ven: 7.452 — ABNORMAL HIGH (ref 7.25–7.43)
pO2, Ven: 37 mmHg (ref 32–45)

## 2023-04-11 LAB — BASIC METABOLIC PANEL
Anion gap: 12 (ref 5–15)
BUN: 6 mg/dL (ref 6–20)
CO2: 27 mmol/L (ref 22–32)
Calcium: 9.1 mg/dL (ref 8.9–10.3)
Chloride: 97 mmol/L — ABNORMAL LOW (ref 98–111)
Creatinine, Ser: 0.78 mg/dL (ref 0.44–1.00)
GFR, Estimated: >60 mL/min (ref >60)
Glucose, Bld: 236 mg/dL — ABNORMAL HIGH (ref 70–99)
Potassium: 3.8 mmol/L (ref 3.5–5.1)
Sodium: 136 mmol/L (ref 135–145)

## 2023-04-11 LAB — CBC WITH DIFFERENTIAL/PLATELET
Abs Immature Granulocytes: 0.03 10*3/uL (ref 0.00–0.07)
Basophils Absolute: 0 10*3/uL (ref 0.0–0.1)
Basophils Relative: 0 %
Eosinophils Absolute: 0.1 10*3/uL (ref 0.0–0.5)
Eosinophils Relative: 1 %
HCT: 35.4 % — ABNORMAL LOW (ref 36.0–46.0)
Hemoglobin: 11.9 g/dL — ABNORMAL LOW (ref 12.0–15.0)
Immature Granulocytes: 0 %
Lymphocytes Relative: 24 %
Lymphs Abs: 2.5 10*3/uL (ref 0.7–4.0)
MCH: 29.8 pg (ref 26.0–34.0)
MCHC: 33.6 g/dL (ref 30.0–36.0)
MCV: 88.7 fL (ref 80.0–100.0)
Monocytes Absolute: 0.6 10*3/uL (ref 0.1–1.0)
Monocytes Relative: 6 %
Neutro Abs: 7.1 10*3/uL (ref 1.7–7.7)
Neutrophils Relative %: 69 %
Platelets: 362 10*3/uL (ref 150–400)
RBC: 3.99 MIL/uL (ref 3.87–5.11)
RDW: 13.5 % (ref 11.5–15.5)
WBC: 10.4 10*3/uL (ref 4.0–10.5)
nRBC: 0 % (ref 0.0–0.2)

## 2023-04-11 LAB — CBG MONITORING, ED: Glucose-Capillary: 268 mg/dL — ABNORMAL HIGH (ref 70–99)

## 2023-04-11 LAB — BETA-HYDROXYBUTYRIC ACID: Beta-Hydroxybutyric Acid: 0.14 mmol/L (ref 0.05–0.27)

## 2023-04-11 MED ORDER — SODIUM CHLORIDE 0.9 % IV BOLUS
1000.0000 mL | Freq: Once | INTRAVENOUS | Status: AC
  Administered 2023-04-11: 1000 mL via INTRAVENOUS

## 2023-04-11 MED ORDER — KETOROLAC TROMETHAMINE 30 MG/ML IJ SOLN
30.0000 mg | Freq: Once | INTRAMUSCULAR | Status: AC
  Administered 2023-04-11: 30 mg via INTRAVENOUS
  Filled 2023-04-11: qty 1

## 2023-04-11 MED ORDER — ONDANSETRON HCL 4 MG/2ML IJ SOLN
4.0000 mg | Freq: Once | INTRAMUSCULAR | Status: AC
  Administered 2023-04-11: 4 mg via INTRAVENOUS
  Filled 2023-04-11: qty 2

## 2023-04-11 MED ORDER — HALOPERIDOL LACTATE 5 MG/ML IJ SOLN
5.0000 mg | Freq: Once | INTRAMUSCULAR | Status: AC
  Administered 2023-04-11: 5 mg via INTRAVENOUS
  Filled 2023-04-11: qty 1

## 2023-04-11 MED ORDER — FENTANYL CITRATE PF 50 MCG/ML IJ SOSY
50.0000 ug | PREFILLED_SYRINGE | Freq: Once | INTRAMUSCULAR | Status: AC
  Administered 2023-04-11: 50 ug via INTRAVENOUS
  Filled 2023-04-11: qty 1

## 2023-04-11 NOTE — ED Provider Notes (Signed)
  Physical Exam  BP (!) 173/113   Pulse (!) 119   Temp 99 F (37.2 C) (Oral)   Resp 10   Ht 5\' 6"  (1.676 m)   Wt 79.4 kg   LMP 04/01/2023   SpO2 100%   BMI 28.25 kg/m   Physical Exam  Procedures  Procedures  ED Course / MDM    Medical Decision Making Amount and/or Complexity of Data Reviewed Labs: ordered.  Risk Prescription drug management.   Pt reassessed. No emesis, has abd pain and nausea. Suspect gastroparesis. Haldol ordered.  Po challenge initiated.       Derwood Kaplan, MD 04/11/23 [id number]

## 2023-04-11 NOTE — ED Provider Notes (Signed)
  Physical Exam  BP (!) 130/96 (BP Location: Left Arm)   Pulse (!) 129   Temp 99 F (37.2 C) (Oral)   Resp 14   Ht 5\' 6"  (1.676 m)   Wt 79.4 kg   LMP 04/01/2023   SpO2 97%   BMI 28.25 kg/m   Physical Exam  Procedures  Procedures  ED Course / MDM    Medical Decision Making Amount and/or Complexity of Data Reviewed Labs: ordered.  Risk Prescription drug management.   Pt comes in with cc of elevated blood sugar. Labs pending.        Derwood Kaplan, MD 04/11/23 1359

## 2023-04-11 NOTE — ED Provider Notes (Signed)
Patient with a history of diabetes with frequent episodes of DKA who was here last night due to hyperglycemia abdominal pain and vomiting was discharged and returning today with similar symptoms.  No evidence of DKA today with normal anion gap of 12.  Concern for gastroparesis.  Patient treated symptomatically  Pt passed the po challenge and was d/ced home with outpt f/u.   Gwyneth Sprout, MD 04/11/23 1710

## 2023-04-11 NOTE — ED Notes (Signed)
Pt discharged home and given discharge paperwork. Opportunities given for questions. Pt verbalizes understanding. PIV removed x1. Stone,Heather R , RN 

## 2023-04-11 NOTE — ED Provider Notes (Signed)
[place] EMERGENCY DEPARTMENT AT [facility] Provider Note   CSN: [id number] Arrival date & time: 04/11/23  1247     History {Add pertinent medical, surgical, social history, OB history to HPI:1} Chief Complaint  Patient presents with   Hyperglycemia    [name] is a 37 y.o. female.   Hyperglycemia  This patient is a 37 year old female well-known to the emergency department for poorly controlled diabetes with recurrent admissions to the hospital for DKA in fact she has been admitted twice over the month of August.  She was actually here last night because of pain, nausea, vomiting, she had a workup that was completed, the patient stated that she felt much better after medications and was discharged home.  She reports that when she woke up this morning she had persistent vomiting abdominal pain body aches and what she describes as severe neuropathic pain in her legs.  She presents for that reason.  She has not had anything to eat today but did try to drink some juice which she promptly vomited.  She has urinary frequency, increased thirst, no headaches, no fever, no diarrhea, no rectal bleeding.    Home Medications Prior to Admission medications   Medication Sig Start Date End Date Taking? Authorizing Provider  amitriptyline (ELAVIL) 25 MG tablet Take 75 mg by mouth at bedtime. 06/26/22   [provider]  Blood Glucose Monitoring Suppl DEVI 1 each by Does not apply route in the morning, at noon, and at bedtime. May substitute based on patient's preference and/or insurance. 04/06/23   Almon Hercules, MD  Continuous Blood Gluc Transmit (DEXCOM G6 TRANSMITTER) MISC Inject 1 each into the skin every 14 (fourteen) days. 12/31/21   Dulce Sellar, NP  cyanocobalamin (VITAMIN B12) 1000 MCG/ML injection Inject 1,000 mcg into the muscle See admin instructions. 1000 mcg injected IM every Tuesday. 08/31/22   [provider]  DULoxetine (CYMBALTA) 60 MG  capsule Take 60 mg by mouth daily. 06/28/22   [provider]  Glucose Blood (BLOOD GLUCOSE TEST STRIPS) STRP 1 each by In Vitro route in the morning, at noon, and at bedtime. May substitute based on patient's preference and/or insurance. 04/06/23 05/09/23  Almon Hercules, MD  insulin aspart (NOVOLOG) 100 UNIT/ML FlexPen Inject 0-18 Units into the skin 3 (three) times daily with meals. Check Blood Glucose (BG) and inject per scale: BG <150= 0 unit; BG 150-200= 3 unit; BG 201-250= 6 unit; BG 251-300= 9 unit; BG 301-350= 12 unit; BG 351-400= 15 unit; BG >400= 18 unit and Call Primary Care. 02/04/23 05/05/23  Dahal, Melina Schools, MD  insulin detemir (LEVEMIR FLEXTOUCH) 100 UNIT/ML FlexPen Inject 15 Units into the skin 2 (two) times daily. Patient taking differently: Inject 60 Units into the skin at bedtime. 02/04/23   Lorin Glass, MD  Insulin Pen Needle (B-D UF III MINI PEN NEEDLES) 31G X 5 MM MISC To use with Insulin pens, as directed 11/20/21   Dulce Sellar, NP  Insulin Syringes, Disposable, U-100 1 ML MISC Inject 1 each into the skin in the morning and at bedtime. 08/10/21   Dulce Sellar, NP  Lancet Device MISC 1 each by Does not apply route in the morning, at noon, and at bedtime. May substitute based on patient's preference and/or insurance. 04/06/23 05/06/23  Almon Hercules, MD  Lancets Misc. MISC 1 each by Does not apply route in the morning, at noon, and at bedtime. May substitute based on patient's preference and/or insurance. 04/06/23 05/06/23  Almon Hercules, MD  metoCLOPramide (REGLAN) 10 MG tablet Take 1 tablet (10 mg total) by mouth 3 (three) times daily with meals for 14 days. 02/04/23 05/31/23  Lorin Glass, MD  oxyCODONE (ROXICODONE) 15 MG immediate release tablet Take 15 mg by mouth every 6 (six) hours as needed for pain.    [provider]  pantoprazole (PROTONIX) 40 MG tablet Take 1 tablet (40 mg total) by mouth 2 (two) times daily. 04/05/23   Almon Hercules, MD  phentermine  (ADIPEX-P) 37.5 MG tablet Take 37.5 mg by mouth daily.    [provider]      Allergies    Mirapex [pramipexole]    Review of Systems   Review of Systems  All other systems reviewed and are negative.   Physical Exam Updated Vital Signs BP (!) 130/96 (BP Location: Left Arm)   Pulse (!) 129   Temp 99 F (37.2 C) (Oral)   Resp 14   Ht 1.676 m (5\' 6" )   Wt 79.4 kg   LMP 04/01/2023   SpO2 97%   BMI 28.25 kg/m  Physical Exam Vitals and nursing note reviewed.  Constitutional:      General: She is not in acute distress.    Appearance: She is well-developed.  HENT:     Head: Normocephalic and atraumatic.     Mouth/Throat:     Pharynx: No oropharyngeal exudate.  Eyes:     General: No scleral icterus.       Right eye: No discharge.        Left eye: No discharge.     Conjunctiva/sclera: Conjunctivae normal.     Pupils: Pupils are equal, round, and reactive to light.  Neck:     Thyroid: No thyromegaly.     Vascular: No JVD.  Cardiovascular:     Rate and Rhythm: Regular rhythm. Tachycardia present.     Heart sounds: Normal heart sounds. No murmur heard.    No friction rub. No gallop.  Pulmonary:     Effort: Pulmonary effort is normal. No respiratory distress.     Breath sounds: Normal breath sounds. No wheezing or rales.  Abdominal:     General: Bowel sounds are normal. There is no distension.     Palpations: Abdomen is soft. There is no mass.     Tenderness: There is no abdominal tenderness.  Musculoskeletal:        General: No tenderness. Normal range of motion.     Cervical back: Normal range of motion and neck supple.  Lymphadenopathy:     Cervical: No cervical adenopathy.  Skin:    General: Skin is warm and dry.     Findings: No erythema or rash.  Neurological:     Mental Status: She is alert.     Coordination: Coordination normal.  Psychiatric:        Behavior: Behavior normal.     ED Results / Procedures / Treatments   Labs (all labs ordered  are listed, but only abnormal results are displayed) Labs Reviewed  CBG MONITORING, ED - Abnormal; Notable for the following components:      Result Value   Glucose-Capillary 268 (*)    All other components within normal limits  CBC WITH DIFFERENTIAL/PLATELET  BASIC METABOLIC PANEL  BETA-HYDROXYBUTYRIC ACID  I-STAT VENOUS BLOOD GAS, ED    EKG None  Radiology No results found.  Procedures Procedures  {Document cardiac monitor, telemetry assessment procedure when appropriate:1}  Medications Ordered in ED Medications  sodium chloride 0.9 % bolus 1,000 mL (has no administration in time range)  ketorolac (TORADOL) 30 MG/ML injection 30 mg (has no administration in time range)    ED Course/ Medical Decision Making/ A&P   {   Click here for ABCD2, HEART and other calculatorsREFRESH Note before signing :1}                              Medical Decision Making Amount and/or Complexity of Data Reviewed Labs: ordered.  Risk Prescription drug management.    This patient presents to the ED for concern of nausea vomiting abdominal discomfort, this involves an extensive number of treatment options, and is a complaint that carries with it a high risk of complications and morbidity.  The differential diagnosis includes uncontrolled diabetes, infection, she may have some degree of gastroparesis though I do not see a gastric emptying study it would be a very common finding in someone who already has diabetic neuropathy at such a young age.  She is well aware of how to take her insulin and states that she usually runs around 180 in the morning when she wakes up, she takes a sliding scale short acting and Levemir 60 units at night.     Co morbidities that complicate the patient evaluation  Uncontrolled diabetes   Additional history obtained:  Additional history obtained from medical record External records from outside source obtained and reviewed including recent admissions to the  hospital   Lab Tests:  I Ordered, and personally interpreted labs.  The pertinent results include:  ***   Imaging Studies ordered:  I ordered imaging studies including ***  I independently visualized and interpreted imaging which showed *** I agree with the radiologist interpretation   Cardiac Monitoring: / EKG:  The patient was maintained on a cardiac monitor.  I personally viewed and interpreted the cardiac monitored which showed an underlying rhythm of: ***   Consultations Obtained:  I requested consultation with the ***,  and discussed lab and imaging findings as well as pertinent plan - they recommend: ***   Problem List / ED Course / Critical interventions / Medication management  *** I ordered medication including ***  for ***  Reevaluation of the patient after these medicines showed that the patient {resolved/improved/worsened:23923::"improved"} I have reviewed the patients home medicines and have made adjustments as needed   Social Determinants of Health:  ***   Test / Admission - Considered:  ***   {Document critical care time when appropriate:1} {Document review of labs and clinical decision tools ie heart score, Chads2Vasc2 etc:1}  {Document your independent review of radiology images, and any outside records:1} {Document your discussion with family members, caretakers, and with consultants:1} {Document social determinants of health affecting pt's care:1} {Document your decision making why or why not admission, treatments were needed:1} Final Clinical Impression(s) / ED Diagnoses Final diagnoses:  None    Rx / DC Orders ED Discharge Orders     None

## 2023-04-11 NOTE — ED Triage Notes (Signed)
Pt was at home when she began having worse diabetic neuropathy pain. Pain is generalized. Pt also c/o Hyoerglycemia. When checked at home CBG 295.Pt compliant with insulin. Pt also c/o N&V

## 2023-04-11 NOTE — ED Notes (Addendum)
Pt taken from car in wheelchair to triage.  Per husband, pt in diabetic crisis.  Pt sleepy, but easily aroused and cooperative with RN in triage.  Pt states she was just seen in ED yesterday for similar episode.

## 2023-05-01 ENCOUNTER — Other Ambulatory Visit: Payer: Self-pay

## 2023-05-01 ENCOUNTER — Emergency Department (HOSPITAL_BASED_OUTPATIENT_CLINIC_OR_DEPARTMENT_OTHER): Payer: BC Managed Care – PPO

## 2023-05-01 ENCOUNTER — Emergency Department (HOSPITAL_BASED_OUTPATIENT_CLINIC_OR_DEPARTMENT_OTHER)
Admission: EM | Admit: 2023-05-01 | Discharge: 2023-05-02 | Disposition: A | Discharge status: Home / Self Care | Payer: BC Managed Care – PPO | Attending: Emergency Medicine | Admitting: Emergency Medicine

## 2023-05-01 ENCOUNTER — Encounter (HOSPITAL_BASED_OUTPATIENT_CLINIC_OR_DEPARTMENT_OTHER): Payer: Self-pay

## 2023-05-01 DIAGNOSIS — E860 Dehydration: Secondary | ICD-10-CM | POA: Diagnosis not present

## 2023-05-01 DIAGNOSIS — R1110 Vomiting, unspecified: Secondary | ICD-10-CM | POA: Diagnosis present

## 2023-05-01 DIAGNOSIS — E109 Type 1 diabetes mellitus without complications: Secondary | ICD-10-CM | POA: Diagnosis not present

## 2023-05-01 DIAGNOSIS — K3184 Gastroparesis: Secondary | ICD-10-CM | POA: Diagnosis not present

## 2023-05-01 DIAGNOSIS — Z794 Long term (current) use of insulin: Secondary | ICD-10-CM | POA: Insufficient documentation

## 2023-05-01 LAB — CBC WITH DIFFERENTIAL/PLATELET
Abs Immature Granulocytes: 0.03 10*3/uL (ref 0.00–0.07)
Basophils Absolute: 0.1 10*3/uL (ref 0.0–0.1)
Basophils Relative: 1 %
Eosinophils Absolute: 0.2 10*3/uL (ref 0.0–0.5)
Eosinophils Relative: 2 %
HCT: 41.8 % (ref 36.0–46.0)
Hemoglobin: 14.8 g/dL (ref 12.0–15.0)
Immature Granulocytes: 0 %
Lymphocytes Relative: 35 %
Lymphs Abs: 3.3 10*3/uL (ref 0.7–4.0)
MCH: 31 pg (ref 26.0–34.0)
MCHC: 35.4 g/dL (ref 30.0–36.0)
MCV: 87.4 fL (ref 80.0–100.0)
Monocytes Absolute: 0.8 10*3/uL (ref 0.1–1.0)
Monocytes Relative: 8 %
Neutro Abs: 5.2 10*3/uL (ref 1.7–7.7)
Neutrophils Relative %: 54 %
Platelets: 361 10*3/uL (ref 150–400)
RBC: 4.78 MIL/uL (ref 3.87–5.11)
RDW: 13.1 % (ref 11.5–15.5)
WBC: 9.5 10*3/uL (ref 4.0–10.5)
nRBC: 0 % (ref 0.0–0.2)

## 2023-05-01 LAB — HCG, SERUM, QUALITATIVE: Preg, Serum: NEGATIVE

## 2023-05-01 LAB — COMPREHENSIVE METABOLIC PANEL
ALT: 9 U/L (ref 0–44)
AST: 12 U/L — ABNORMAL LOW (ref 15–41)
Albumin: 4.5 g/dL (ref 3.5–5.0)
Alkaline Phosphatase: 97 U/L (ref 38–126)
Anion gap: 14 (ref 5–15)
BUN: 16 mg/dL (ref 6–20)
CO2: 27 mmol/L (ref 22–32)
Calcium: 9.9 mg/dL (ref 8.9–10.3)
Chloride: 87 mmol/L — ABNORMAL LOW (ref 98–111)
Creatinine, Ser: 1.36 mg/dL — ABNORMAL HIGH (ref 0.44–1.00)
GFR, Estimated: 52 mL/min — ABNORMAL LOW (ref >60)
Glucose, Bld: 449 mg/dL — ABNORMAL HIGH (ref 70–99)
Potassium: 4.5 mmol/L (ref 3.5–5.1)
Sodium: 128 mmol/L — ABNORMAL LOW (ref 135–145)
Total Bilirubin: 0.5 mg/dL (ref 0.3–1.2)
Total Protein: 8.7 g/dL — ABNORMAL HIGH (ref 6.5–8.1)

## 2023-05-01 LAB — I-STAT VENOUS BLOOD GAS, ED
Acid-Base Excess: 5 mmol/L — ABNORMAL HIGH (ref 0.0–2.0)
Bicarbonate: 32.1 mmol/L — ABNORMAL HIGH (ref 20.0–28.0)
Calcium, Ion: 1.12 mmol/L — ABNORMAL LOW (ref 1.15–1.40)
HCT: 42 % (ref 36.0–46.0)
Hemoglobin: 14.3 g/dL (ref 12.0–15.0)
O2 Saturation: 71 %
Patient temperature: 98.2
Potassium: 6.5 mmol/L (ref 3.5–5.1)
Sodium: 126 mmol/L — ABNORMAL LOW (ref 135–145)
TCO2: 34 mmol/L — ABNORMAL HIGH (ref 22–32)
pCO2, Ven: 54.1 mmHg (ref 44–60)
pH, Ven: 7.38 (ref 7.25–7.43)
pO2, Ven: 38 mmHg (ref 32–45)

## 2023-05-01 LAB — CBG MONITORING, ED
Glucose-Capillary: 346 mg/dL — ABNORMAL HIGH (ref 70–99)
Glucose-Capillary: 448 mg/dL — ABNORMAL HIGH (ref 70–99)

## 2023-05-01 LAB — MAGNESIUM: Magnesium: 2.3 mg/dL (ref 1.7–2.4)

## 2023-05-01 LAB — ETHANOL: Alcohol, Ethyl (B): <10 mg/dL (ref <10)

## 2023-05-01 LAB — LIPASE, BLOOD: Lipase: <10 U/L — ABNORMAL LOW (ref 11–51)

## 2023-05-01 LAB — BETA-HYDROXYBUTYRIC ACID: Beta-Hydroxybutyric Acid: 0.53 mmol/L — ABNORMAL HIGH (ref 0.05–0.27)

## 2023-05-01 MED ORDER — INSULIN ASPART 100 UNIT/ML IJ SOLN
10.0000 [IU] | Freq: Once | INTRAMUSCULAR | Status: AC
  Administered 2023-05-01: 10 [IU] via SUBCUTANEOUS

## 2023-05-01 MED ORDER — SODIUM CHLORIDE 0.9 % IV BOLUS
1000.0000 mL | Freq: Once | INTRAVENOUS | Status: AC
  Administered 2023-05-01: 1000 mL via INTRAVENOUS

## 2023-05-01 MED ORDER — DROPERIDOL 2.5 MG/ML IJ SOLN
2.5000 mg | Freq: Once | INTRAMUSCULAR | Status: AC
  Administered 2023-05-01: 2.5 mg via INTRAVENOUS
  Filled 2023-05-01: qty 2

## 2023-05-01 MED ORDER — FENTANYL CITRATE PF 50 MCG/ML IJ SOSY
50.0000 ug | PREFILLED_SYRINGE | Freq: Once | INTRAMUSCULAR | Status: AC
  Administered 2023-05-01: 50 ug via INTRAVENOUS
  Filled 2023-05-01: qty 1

## 2023-05-01 NOTE — ED Triage Notes (Signed)
Pt BIB family member w complaint of "possible diabetic coma." Family member stated that "she's been throwing up all day," confirmed pt hx diabetes. Pt responsive to pain/ ammonia pack on arrival, able to answer questions appropriately.   Pt reports CBG "running high, feeling bad, vomiting/ pain all over."

## 2023-05-01 NOTE — ED Provider Notes (Addendum)
[facility] AT [facility] Provider Note  CSN: [id number] Arrival date & time: 05/01/23 1846  Chief Complaint(s) No chief complaint on file.  HPI [place] L Xiao Kaushik is a 37 y.o. female with history of diabetes presenting to the emergency department with vomiting.  Patient reports over the past few days has had abdominal pain, vomiting.  She reports compliance with her medications including insulin.  No fevers or chills.  Abdominal pain feels like chronic gastroparesis.  Has been having bowel movements.  No urinary symptoms.  No cough, runny nose or sore throat.  No head injury or headaches.  Has felt sleepier than normal.   Past Medical History Past Medical History:  Diagnosis Date   Chronic constipation 11/08/2021   Diabetic neuropathy (HCC)    History of chicken pox    History of noncompliance with medical treatment 09/07/2022   Type 1 diabetes (HCC)    Vaginal odor 11/08/2021   Patient Active Problem List   Diagnosis Date Noted   Cholelithiasis 04/04/2023   Hepatic steatosis 04/04/2023   Transaminitis 04/04/2023   Type 1 diabetes mellitus with hyperosmolar hyperglycemic state (HHS) (HCC) 03/31/2023   Intractable cyclical vomiting with nausea 03/31/2023   Cellulitis 02/02/2023   Severe hyperglycemia due to diabetes mellitus (HCC) 02/02/2023   Prolonged QT interval 02/02/2023   Intractable nausea and vomiting 09/07/2022   Diabetic gastroparesis (HCC) 09/07/2022   Uncontrolled type 1 diabetes mellitus with hyperglycemia, with long-term current use of insulin (HCC) 09/07/2022   Diabetes mellitus due to underlying condition with diabetic neuropathy (HCC) 09/07/2022   History of noncompliance with medical treatment 09/07/2022   Nausea & vomiting 05/08/2022   Misuse of medication 05/08/2022   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   AKI (acute kidney injury) (HCC) 03/09/2022   UTI (urinary tract infection) 03/09/2022   Chronic  pain syndrome 03/09/2022   Vulvar dysplasia    Type 1 diabetes mellitus with hyperlipidemia (HCC) 12/02/2021   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   Gastroesophageal reflux disease with esophagitis without hemorrhage 11/28/2021   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 09/10/2021   Drug induced constipation 08/10/2021   Vaginal yeast infection 08/10/2021   Abdominal pain 02/15/2021   Nausea and vomiting 02/15/2021   Diabetic polyneuropathy associated with type 1 diabetes mellitus (HCC) 03/14/2019   Non compliance with medical treatment 03/14/2019   DKA, type 1 (HCC) 07/21/2018   Uncontrolled type 1 diabetes mellitus with hyperglycemia (HCC) 07/03/2011    Class: Acute   Home Medication(s) Prior to Admission medications   Medication Sig Start Date End Date Taking? Authorizing Provider  amitriptyline (ELAVIL) 25 MG tablet Take 75 mg by mouth at bedtime. 06/26/22   [provider]  Blood Glucose Monitoring Suppl DEVI 1 each by Does not apply route in the morning, at noon, and at bedtime. May substitute based on patient's preference and/or insurance. 04/06/23   Almon Hercules, MD  Continuous Blood Gluc Transmit (DEXCOM G6 TRANSMITTER) MISC Inject 1 each into the skin every 14 (fourteen) days. 12/31/21   Dulce Sellar, NP  cyanocobalamin (VITAMIN B12) 1000 MCG/ML injection Inject 1,000 mcg into the muscle See admin instructions. 1000 mcg injected IM every Tuesday. 08/31/22   [provider]  DULoxetine (CYMBALTA) 60 MG capsule Take 60 mg by mouth daily. 06/28/22   [provider]  Glucose Blood (BLOOD GLUCOSE TEST STRIPS) STRP 1 each by In Vitro route in the morning, at noon, and at bedtime. May substitute based on patient's preference and/or  insurance. 04/06/23 05/09/23  Almon Hercules, MD  insulin aspart (NOVOLOG) 100 UNIT/ML FlexPen Inject 0-18 Units into the skin 3 (three) times daily with meals. Check Blood Glucose (BG) and inject per scale: BG <150= 0 unit; BG 150-200= 3 unit; BG  201-250= 6 unit; BG 251-300= 9 unit; BG 301-350= 12 unit; BG 351-400= 15 unit; BG >400= 18 unit and Call Primary Care. 02/04/23 05/05/23  Dahal, Melina Schools, MD  insulin detemir (LEVEMIR FLEXTOUCH) 100 UNIT/ML FlexPen Inject 15 Units into the skin 2 (two) times daily. Patient taking differently: Inject 60 Units into the skin at bedtime. 02/04/23   Lorin Glass, MD  Insulin Pen Needle (B-D UF III MINI PEN NEEDLES) 31G X 5 MM MISC To use with Insulin pens, as directed 11/20/21   Dulce Sellar, NP  Insulin Syringes, Disposable, U-100 1 ML MISC Inject 1 each into the skin in the morning and at bedtime. 08/10/21   Dulce Sellar, NP  Lancet Device MISC 1 each by Does not apply route in the morning, at noon, and at bedtime. May substitute based on patient's preference and/or insurance. 04/06/23 05/06/23  Almon Hercules, MD  Lancets Misc. MISC 1 each by Does not apply route in the morning, at noon, and at bedtime. May substitute based on patient's preference and/or insurance. 04/06/23 05/06/23  Almon Hercules, MD  metoCLOPramide (REGLAN) 10 MG tablet Take 1 tablet (10 mg total) by mouth 3 (three) times daily with meals for 14 days. 02/04/23 05/31/23  Lorin Glass, MD  oxyCODONE (ROXICODONE) 15 MG immediate release tablet Take 15 mg by mouth every 6 (six) hours as needed for pain.    [provider]  pantoprazole (PROTONIX) 40 MG tablet Take 1 tablet (40 mg total) by mouth 2 (two) times daily. 04/05/23   Almon Hercules, MD  phentermine (ADIPEX-P) 37.5 MG tablet Take 37.5 mg by mouth daily.    [provider]                                                                                                                                    Past Surgical History Past Surgical History:  Procedure Laterality Date   CESAREAN SECTION     only one C/S per pt.   CO2 LASER APPLICATION N/A 02/22/2022   Procedure: CO2 LASER APPLICATION TO VULVA;  Surgeon: Carver Fila, MD;  Location: [facility];  Service: General;  Laterality: N/A;   DILATION AND CURETTAGE OF UTERUS     EXCISION OF SKIN TAG N/A 02/22/2022   Procedure: EXCISION OF ANAL SKIN TAGS;  Surgeon: Romie Levee, MD;  Location: [facility];  Service: General;  Laterality: N/A;   Family History Family History  Problem Relation Age of Onset   Hypertension Mother    Glaucoma Mother    Diabetes Maternal Grandmother    Neuropathy Neg Hx    Colon cancer Neg Hx  Stomach cancer Neg Hx    Esophageal cancer Neg Hx    Colon polyps Neg Hx    Rectal cancer Neg Hx     Social History Social History   Tobacco Use   Smoking status: Never    Passive exposure: Never   Smokeless tobacco: Never  Vaping Use   Vaping status: Never Used  Substance Use Topics   Alcohol use: No   Drug use: Never   Allergies Mirapex [pramipexole]  Review of Systems Review of Systems  All other systems reviewed and are negative.   Physical Exam Vital Signs  I have reviewed the triage vital signs BP 114/82 (BP Location: Right Arm)   Pulse (!) 105   Temp 98.3 F (36.8 C) (Oral)   Resp 16   LMP 04/01/2023   SpO2 98%  Physical Exam Vitals and nursing note reviewed.  Constitutional:      General: She is not in acute distress.    Appearance: She is well-developed.  HENT:     Head: Normocephalic and atraumatic.     Mouth/Throat:     Mouth: Mucous membranes are dry.  Eyes:     Pupils: Pupils are equal, round, and reactive to light.  Cardiovascular:     Rate and Rhythm: Normal rate and regular rhythm.     Heart sounds: No murmur heard. Pulmonary:     Effort: Pulmonary effort is normal. No respiratory distress.     Breath sounds: Normal breath sounds.  Abdominal:     General: Abdomen is flat.     Palpations: Abdomen is soft.     Tenderness: There is no abdominal tenderness.  Musculoskeletal:        General: No tenderness.     Right lower leg: No edema.     Left lower leg: No edema.  Skin:    General: Skin  is warm and dry.  Neurological:     General: No focal deficit present.     Mental Status: She is alert. Mental status is at baseline.  Psychiatric:        Mood and Affect: Mood normal.        Behavior: Behavior normal.     ED Results and Treatments Labs (all labs ordered are listed, but only abnormal results are displayed) Labs Reviewed  COMPREHENSIVE METABOLIC PANEL - Abnormal; Notable for the following components:      Result Value   Sodium 128 (*)    Chloride 87 (*)    Glucose, Bld 449 (*)    Creatinine, Ser 1.36 (*)    Total Protein 8.7 (*)    AST 12 (*)    GFR, Estimated 52 (*)    All other components within normal limits  BETA-HYDROXYBUTYRIC ACID - Abnormal; Notable for the following components:   Beta-Hydroxybutyric Acid 0.53 (*)    All other components within normal limits  LIPASE, BLOOD - Abnormal; Notable for the following components:   Lipase <10 (*)    All other components within normal limits  I-STAT VENOUS BLOOD GAS, ED - Abnormal; Notable for the following components:   Bicarbonate 32.1 (*)    TCO2 34 (*)    Acid-Base Excess 5.0 (*)    Sodium 126 (*)    Potassium 6.5 (*)    Calcium, Ion 1.12 (*)    All other components within normal limits  CBG MONITORING, ED - Abnormal; Notable for the following components:   Glucose-Capillary 448 (*)    All other components within normal limits  CBC WITH DIFFERENTIAL/PLATELET  MAGNESIUM  ETHANOL  HCG, SERUM, QUALITATIVE  PREGNANCY, URINE  URINALYSIS, ROUTINE W REFLEX MICROSCOPIC  RAPID URINE DRUG SCREEN, HOSP PERFORMED  CBG MONITORING, ED                                                                                                                          Radiology CT Head Wo Contrast  Result Date: 05/01/2023 CLINICAL DATA:  Altered mental status. EXAM: CT HEAD WITHOUT CONTRAST TECHNIQUE: Contiguous axial images were obtained from the base of the skull through the vertex without intravenous contrast. RADIATION  DOSE REDUCTION: This exam was performed according to the departmental dose-optimization program which includes automated exposure control, adjustment of the mA and/or kV according to patient size and/or use of iterative reconstruction technique. COMPARISON:  September 07, 2022 FINDINGS: Brain: No evidence of acute infarction, hemorrhage, hydrocephalus, extra-axial collection or mass lesion/mass effect. Vascular: No hyperdense vessel or unexpected calcification. Skull: Normal. Negative for fracture or focal lesion. Sinuses/Orbits: No acute finding. Other: None. IMPRESSION: No acute intracranial pathology. Electronically Signed   By: Aram Candela M.D.   On: 05/01/2023 20:49   DG Chest Port 1 View  Result Date: 05/01/2023 CLINICAL DATA:  Altered mental status. EXAM: PORTABLE CHEST 1 VIEW COMPARISON:  March 31, 2023 FINDINGS: The heart size and mediastinal contours are within normal limits. Both lungs are clear. The visualized skeletal structures are unremarkable. IMPRESSION: No active disease. Electronically Signed   By: Aram Candela M.D.   On: 05/01/2023 20:47    Pertinent labs & imaging results that were available during my care of the patient were reviewed by me and considered in my medical decision making (see MDM for details).  Medications Ordered in ED Medications  sodium chloride 0.9 % bolus 1,000 mL (0 mLs Intravenous Stopped 05/01/23 2051)  droperidol (INAPSINE) 2.5 MG/ML injection 2.5 mg (2.5 mg Intravenous Given 05/01/23 2054)  fentaNYL (SUBLIMAZE) injection 50 mcg (50 mcg Intravenous Given 05/01/23 2055)  insulin aspart (novoLOG) injection 10 Units (10 Units Subcutaneous Given 05/01/23 2101)  sodium chloride 0.9 % bolus 1,000 mL (1,000 mLs Intravenous New Bag/Given 05/01/23 2300)                                                                                                                                     Procedures Procedures  (including critical care time)  Medical Decision  Making / ED Course   MDM:  38 year old female presenting to the emergency department with vomiting.  Patient overall well-appearing, exam with some dehydration and dry mucous membranes.  No abdominal tenderness.  Suspect patient has had significant vomiting from her underlying gastroparesis, she reports this feels similar.  She reports feeling back to normal after receiving fentanyl and droperidol.  She has no abdominal tenderness to suggest other intra-abdominal pathology such as pancreatitis, obstruction, perforation, cholecystitis or any other acute process.  Considered DKA but patient has no anion gap.  Beta hydroxybutyrate is minimally elevated likely due to decreased p.o. intake.  VBG with no acidosis.  Labs notable for mild hyponatremia, acute kidney injury with elevated creatinine likely in setting of vomiting.  Patient does have some persistent tachycardia and will give additional liter of fluids.  Gave 10 units of insulin for hyperglycemia.  Pregnancy is negative.  Doubt intracranial process, patient was initially sort of somnolent although this resolved after giving her an ammonia capsule, obtain CT scan which was negative  Given persistent tachycardia patient receiving additional liter of fluids with improvement of heart rate. Encouraged increased fluid intake at home. Will discharge patient to home. All questions answered. Patient comfortable with plan of discharge. Return precautions discussed with patient and specified on the after visit summary.          Additional history obtained: -Additional history obtained from spouse -External records from outside source obtained and reviewed including: Chart review including previous notes, labs, imaging, consultation notes including prior ER visits.    Lab Tests: -I ordered, reviewed, and interpreted labs.   The pertinent results include:   Labs Reviewed  COMPREHENSIVE METABOLIC PANEL - Abnormal; Notable for the following  components:      Result Value   Sodium 128 (*)    Chloride 87 (*)    Glucose, Bld 449 (*)    Creatinine, Ser 1.36 (*)    Total Protein 8.7 (*)    AST 12 (*)    GFR, Estimated 52 (*)    All other components within normal limits  BETA-HYDROXYBUTYRIC ACID - Abnormal; Notable for the following components:   Beta-Hydroxybutyric Acid 0.53 (*)    All other components within normal limits  LIPASE, BLOOD - Abnormal; Notable for the following components:   Lipase <10 (*)    All other components within normal limits  I-STAT VENOUS BLOOD GAS, ED - Abnormal; Notable for the following components:   Bicarbonate 32.1 (*)    TCO2 34 (*)    Acid-Base Excess 5.0 (*)    Sodium 126 (*)    Potassium 6.5 (*)    Calcium, Ion 1.12 (*)    All other components within normal limits  CBG MONITORING, ED - Abnormal; Notable for the following components:   Glucose-Capillary 448 (*)    All other components within normal limits  CBC WITH DIFFERENTIAL/PLATELET  MAGNESIUM  ETHANOL  HCG, SERUM, QUALITATIVE  PREGNANCY, URINE  URINALYSIS, ROUTINE W REFLEX MICROSCOPIC  RAPID URINE DRUG SCREEN, HOSP PERFORMED  CBG MONITORING, ED    Notable for no anion gap. Minimally elevated beta hydroxybutyrate. Hyperglycemia   EKG   EKG Interpretation Date/Time:  Thursday May 01 2023 18:53:19 EDT Ventricular Rate:  124 PR Interval:  124 QRS Duration:  77 QT Interval:  323 QTC Calculation: 464 R Axis:   83  Text Interpretation: Sinus tachycardia Borderline repolarization abnormality Baseline wander in lead(s) I III aVL aVF V1 Confirmed by Alvino Blood ([id number]) on 05/01/2023 7:39:27 PM  Imaging Studies ordered: I ordered imaging studies including CXR, CT head On my interpretation imaging demonstrates no acute findings I independently visualized and interpreted imaging. I agree with the radiologist interpretation   Medicines ordered and prescription drug management: Meds ordered this encounter   Medications   sodium chloride 0.9 % bolus 1,000 mL   droperidol (INAPSINE) 2.5 MG/ML injection 2.5 mg   fentaNYL (SUBLIMAZE) injection 50 mcg   insulin aspart (novoLOG) injection 10 Units   sodium chloride 0.9 % bolus 1,000 mL    -I have reviewed the patients home medicines and have made adjustments as needed   Cardiac Monitoring: The patient was maintained on a cardiac monitor.  I personally viewed and interpreted the cardiac monitored which showed an underlying rhythm of: sinus tachycardia    Reevaluation: After the interventions noted above, I reevaluated the patient and found that their symptoms have improved  Co morbidities that complicate the patient evaluation  Past Medical History:  Diagnosis Date   Chronic constipation 11/08/2021   Diabetic neuropathy (HCC)    History of chicken pox    History of noncompliance with medical treatment 09/07/2022   Type 1 diabetes (HCC)    Vaginal odor 11/08/2021      Dispostion: Disposition decision including need for hospitalization was considered, and patient disposition pending at time of sign out.    Final Clinical Impression(s) / ED Diagnoses Final diagnoses:  Gastroparesis  Dehydration     This chart was dictated using voice recognition software.  Despite best efforts to proofread,  errors can occur which can change the documentation meaning.    Lonell Grandchild, MD 05/01/23 1610    Lonell Grandchild, MD 05/01/23 [phone]

## 2023-05-01 NOTE — Discharge Instructions (Addendum)
We evaluated you for your vomiting.  Your symptoms are likely due to gastroparesis.  Your laboratory testing did not show any sign of DKA.  Your labs did show dehydration.  Please follow-up with your primary care doctor for lab recheck in 1 week to make sure that your dehydration has resolved.  Please be sure to use your insulin as prescribed.  If you do develop any worsening or recurrent symptoms, difficulty breathing, abdominal pain, uncontrolled vomiting, or any other new symptoms, please return to the emergency department.

## 2023-05-05 ENCOUNTER — Inpatient Hospital Stay (HOSPITAL_BASED_OUTPATIENT_CLINIC_OR_DEPARTMENT_OTHER)
Admission: EM | Admit: 2023-05-05 | Discharge: 2023-05-07 | DRG: 639 | Disposition: A | Discharge status: Home / Self Care | Payer: BC Managed Care – PPO | Attending: Internal Medicine | Admitting: Internal Medicine

## 2023-05-05 ENCOUNTER — Other Ambulatory Visit: Payer: Self-pay

## 2023-05-05 ENCOUNTER — Encounter (HOSPITAL_BASED_OUTPATIENT_CLINIC_OR_DEPARTMENT_OTHER): Payer: Self-pay

## 2023-05-05 DIAGNOSIS — F419 Anxiety disorder, unspecified: Secondary | ICD-10-CM | POA: Diagnosis present

## 2023-05-05 DIAGNOSIS — Z83511 Family history of glaucoma: Secondary | ICD-10-CM | POA: Diagnosis not present

## 2023-05-05 DIAGNOSIS — Z8249 Family history of ischemic heart disease and other diseases of the circulatory system: Secondary | ICD-10-CM

## 2023-05-05 DIAGNOSIS — E1065 Type 1 diabetes mellitus with hyperglycemia: Secondary | ICD-10-CM | POA: Diagnosis present

## 2023-05-05 DIAGNOSIS — E1010 Type 1 diabetes mellitus with ketoacidosis without coma: Principal | ICD-10-CM | POA: Diagnosis present

## 2023-05-05 DIAGNOSIS — Z833 Family history of diabetes mellitus: Secondary | ICD-10-CM | POA: Diagnosis not present

## 2023-05-05 DIAGNOSIS — Z794 Long term (current) use of insulin: Secondary | ICD-10-CM

## 2023-05-05 DIAGNOSIS — Z1152 Encounter for screening for COVID-19: Secondary | ICD-10-CM

## 2023-05-05 DIAGNOSIS — K219 Gastro-esophageal reflux disease without esophagitis: Secondary | ICD-10-CM | POA: Diagnosis present

## 2023-05-05 DIAGNOSIS — E860 Dehydration: Secondary | ICD-10-CM | POA: Diagnosis present

## 2023-05-05 DIAGNOSIS — G894 Chronic pain syndrome: Secondary | ICD-10-CM | POA: Diagnosis present

## 2023-05-05 DIAGNOSIS — K3184 Gastroparesis: Secondary | ICD-10-CM | POA: Diagnosis not present

## 2023-05-05 DIAGNOSIS — Z79899 Other long term (current) drug therapy: Secondary | ICD-10-CM

## 2023-05-05 DIAGNOSIS — R112 Nausea with vomiting, unspecified: Principal | ICD-10-CM | POA: Diagnosis present

## 2023-05-05 DIAGNOSIS — Z888 Allergy status to other drugs, medicaments and biological substances status: Secondary | ICD-10-CM | POA: Diagnosis not present

## 2023-05-05 DIAGNOSIS — E1143 Type 2 diabetes mellitus with diabetic autonomic (poly)neuropathy: Secondary | ICD-10-CM

## 2023-05-05 DIAGNOSIS — Z23 Encounter for immunization: Secondary | ICD-10-CM | POA: Diagnosis not present

## 2023-05-05 DIAGNOSIS — E1110 Type 2 diabetes mellitus with ketoacidosis without coma: Secondary | ICD-10-CM | POA: Diagnosis present

## 2023-05-05 DIAGNOSIS — E1043 Type 1 diabetes mellitus with diabetic autonomic (poly)neuropathy: Secondary | ICD-10-CM | POA: Diagnosis not present

## 2023-05-05 LAB — COMPREHENSIVE METABOLIC PANEL
ALT: 9 U/L (ref 0–44)
AST: 10 U/L — ABNORMAL LOW (ref 15–41)
Albumin: 4.7 g/dL (ref 3.5–5.0)
Alkaline Phosphatase: 98 U/L (ref 38–126)
Anion gap: 19 — ABNORMAL HIGH (ref 5–15)
BUN: 19 mg/dL (ref 6–20)
CO2: 22 mmol/L (ref 22–32)
Calcium: 10 mg/dL (ref 8.9–10.3)
Chloride: 94 mmol/L — ABNORMAL LOW (ref 98–111)
Creatinine, Ser: 0.84 mg/dL (ref 0.44–1.00)
GFR, Estimated: >60 mL/min (ref >60)
Glucose, Bld: 416 mg/dL — ABNORMAL HIGH (ref 70–99)
Potassium: 4.3 mmol/L (ref 3.5–5.1)
Sodium: 135 mmol/L (ref 135–145)
Total Bilirubin: 0.5 mg/dL (ref 0.3–1.2)
Total Protein: 8.7 g/dL — ABNORMAL HIGH (ref 6.5–8.1)

## 2023-05-05 LAB — RAPID URINE DRUG SCREEN, HOSP PERFORMED
Amphetamines: NOT DETECTED
Barbiturates: NOT DETECTED
Benzodiazepines: NOT DETECTED
Cocaine: NOT DETECTED
Opiates: NOT DETECTED
Tetrahydrocannabinol: POSITIVE — AB

## 2023-05-05 LAB — BASIC METABOLIC PANEL
Anion gap: 16 — ABNORMAL HIGH (ref 5–15)
BUN: 18 mg/dL (ref 6–20)
CO2: 23 mmol/L (ref 22–32)
Calcium: 9.5 mg/dL (ref 8.9–10.3)
Chloride: 96 mmol/L — ABNORMAL LOW (ref 98–111)
Creatinine, Ser: 0.73 mg/dL (ref 0.44–1.00)
GFR, Estimated: >60 mL/min (ref >60)
Glucose, Bld: 368 mg/dL — ABNORMAL HIGH (ref 70–99)
Potassium: 4.9 mmol/L (ref 3.5–5.1)
Sodium: 135 mmol/L (ref 135–145)

## 2023-05-05 LAB — I-STAT VENOUS BLOOD GAS, ED
Acid-Base Excess: 0 mmol/L (ref 0.0–2.0)
Bicarbonate: 26.4 mmol/L (ref 20.0–28.0)
Calcium, Ion: 1.24 mmol/L (ref 1.15–1.40)
HCT: 33 % — ABNORMAL LOW (ref 36.0–46.0)
Hemoglobin: 11.2 g/dL — ABNORMAL LOW (ref 12.0–15.0)
O2 Saturation: 88 %
Potassium: 4.3 mmol/L (ref 3.5–5.1)
Sodium: 136 mmol/L (ref 135–145)
TCO2: 28 mmol/L (ref 22–32)
pCO2, Ven: 46.9 mmHg (ref 44–60)
pH, Ven: 7.357 (ref 7.25–7.43)
pO2, Ven: 58 mmHg — ABNORMAL HIGH (ref 32–45)

## 2023-05-05 LAB — CBC WITH DIFFERENTIAL/PLATELET
Abs Immature Granulocytes: 0.04 10*3/uL (ref 0.00–0.07)
Basophils Absolute: 0 10*3/uL (ref 0.0–0.1)
Basophils Relative: 0 %
Eosinophils Absolute: 0 10*3/uL (ref 0.0–0.5)
Eosinophils Relative: 0 %
HCT: 40 % (ref 36.0–46.0)
Hemoglobin: 13.7 g/dL (ref 12.0–15.0)
Immature Granulocytes: 0 %
Lymphocytes Relative: 11 %
Lymphs Abs: 1.2 10*3/uL (ref 0.7–4.0)
MCH: 30.6 pg (ref 26.0–34.0)
MCHC: 34.3 g/dL (ref 30.0–36.0)
MCV: 89.3 fL (ref 80.0–100.0)
Monocytes Absolute: 0.3 10*3/uL (ref 0.1–1.0)
Monocytes Relative: 2 %
Neutro Abs: 8.9 10*3/uL — ABNORMAL HIGH (ref 1.7–7.7)
Neutrophils Relative %: 87 %
Platelets: 403 10*3/uL — ABNORMAL HIGH (ref 150–400)
RBC: 4.48 MIL/uL (ref 3.87–5.11)
RDW: 13 % (ref 11.5–15.5)
WBC: 10.4 10*3/uL (ref 4.0–10.5)
nRBC: 0 % (ref 0.0–0.2)

## 2023-05-05 LAB — URINALYSIS, ROUTINE W REFLEX MICROSCOPIC
Bacteria, UA: NONE SEEN
Bilirubin Urine: NEGATIVE
Glucose, UA: >1000 mg/dL — AB
Ketones, ur: >80 mg/dL — AB
Leukocytes,Ua: NEGATIVE
Nitrite: NEGATIVE
Specific Gravity, Urine: 1.035 — ABNORMAL HIGH (ref 1.005–1.030)
pH: 5.5 (ref 5.0–8.0)

## 2023-05-05 LAB — CBG MONITORING, ED
Glucose-Capillary: 356 mg/dL — ABNORMAL HIGH (ref 70–99)
Glucose-Capillary: 361 mg/dL — ABNORMAL HIGH (ref 70–99)
Glucose-Capillary: 345 mg/dL — ABNORMAL HIGH (ref 70–99)
Glucose-Capillary: 325 mg/dL — ABNORMAL HIGH (ref 70–99)
Glucose-Capillary: 254 mg/dL — ABNORMAL HIGH (ref 70–99)
Glucose-Capillary: 228 mg/dL — ABNORMAL HIGH (ref 70–99)
Glucose-Capillary: 243 mg/dL — ABNORMAL HIGH (ref 70–99)

## 2023-05-05 LAB — MAGNESIUM: Magnesium: 2.1 mg/dL (ref 1.7–2.4)

## 2023-05-05 LAB — HCG, QUANTITATIVE, PREGNANCY: hCG, Beta Chain, Quant, S: <1 m[IU]/mL (ref <5)

## 2023-05-05 LAB — SARS CORONAVIRUS 2 BY RT PCR: SARS Coronavirus 2 by RT PCR: NEGATIVE

## 2023-05-05 LAB — LIPASE, BLOOD: Lipase: <10 U/L — ABNORMAL LOW (ref 11–51)

## 2023-05-05 MED ORDER — LACTATED RINGERS IV BOLUS
1000.0000 mL | Freq: Once | INTRAVENOUS | Status: AC
  Administered 2023-05-05: 1000 mL via INTRAVENOUS

## 2023-05-05 MED ORDER — DIPHENHYDRAMINE HCL 50 MG/ML IJ SOLN
25.0000 mg | Freq: Once | INTRAMUSCULAR | Status: AC
  Administered 2023-05-05: 25 mg via INTRAVENOUS
  Filled 2023-05-05: qty 1

## 2023-05-05 MED ORDER — DEXTROSE 50 % IV SOLN: 0.0000 mL | INTRAVENOUS | Status: DC | PRN

## 2023-05-05 MED ORDER — LACTATED RINGERS IV SOLN: INTRAVENOUS | Status: DC

## 2023-05-05 MED ORDER — POTASSIUM CHLORIDE 10 MEQ/100ML IV SOLN
10.0000 meq | INTRAVENOUS | Status: AC
  Administered 2023-05-05 (×2): 10 meq via INTRAVENOUS
  Filled 2023-05-05 (×2): qty 100

## 2023-05-05 MED ORDER — INSULIN ASPART 100 UNIT/ML IJ SOLN
10.0000 [IU] | Freq: Once | INTRAMUSCULAR | Status: AC
  Administered 2023-05-05: 10 [IU] via SUBCUTANEOUS

## 2023-05-05 MED ORDER — MORPHINE SULFATE (PF) 4 MG/ML IV SOLN
4.0000 mg | Freq: Once | INTRAVENOUS | Status: AC
  Administered 2023-05-05: 4 mg via INTRAVENOUS
  Filled 2023-05-05: qty 1

## 2023-05-05 MED ORDER — DICYCLOMINE HCL 10 MG/ML IM SOLN
20.0000 mg | Freq: Once | INTRAMUSCULAR | Status: AC
  Administered 2023-05-05: 20 mg via INTRAMUSCULAR
  Filled 2023-05-05: qty 2

## 2023-05-05 MED ORDER — DROPERIDOL 2.5 MG/ML IJ SOLN
1.2500 mg | Freq: Once | INTRAMUSCULAR | Status: AC
  Administered 2023-05-05: 1.25 mg via INTRAVENOUS
  Filled 2023-05-05: qty 2

## 2023-05-05 MED ORDER — ONDANSETRON HCL 4 MG/2ML IJ SOLN
4.0000 mg | Freq: Once | INTRAMUSCULAR | Status: AC
  Administered 2023-05-05: 4 mg via INTRAVENOUS
  Filled 2023-05-05: qty 2

## 2023-05-05 MED ORDER — LACTATED RINGERS IV BOLUS: 1000.0000 mL | Freq: Once | INTRAVENOUS | Status: DC

## 2023-05-05 MED ORDER — LACTATED RINGERS IV BOLUS: 20.0000 mL/kg | Freq: Once | INTRAVENOUS | Status: DC

## 2023-05-05 MED ORDER — INSULIN REGULAR(HUMAN) IN NACL 100-0.9 UT/100ML-% IV SOLN
INTRAVENOUS | Status: DC
  Administered 2023-05-05: 8.5 [IU]/h via INTRAVENOUS
  Filled 2023-05-05: qty 100

## 2023-05-05 MED ORDER — DEXTROSE IN LACTATED RINGERS 5 % IV SOLN: INTRAVENOUS | Status: DC

## 2023-05-05 MED ORDER — KETOROLAC TROMETHAMINE 15 MG/ML IJ SOLN
15.0000 mg | Freq: Once | INTRAMUSCULAR | Status: AC
  Administered 2023-05-05: 15 mg via INTRAVENOUS
  Filled 2023-05-05: qty 1

## 2023-05-05 MED ORDER — METOCLOPRAMIDE HCL 5 MG/ML IJ SOLN
10.0000 mg | Freq: Once | INTRAMUSCULAR | Status: AC
  Administered 2023-05-05: 10 mg via INTRAVENOUS
  Filled 2023-05-05: qty 2

## 2023-05-05 NOTE — ED Provider Notes (Signed)
[place] EMERGENCY DEPARTMENT AT [facility] Provider Note   CSN: [id number] Arrival date & time: 05/05/23  1336     History  Chief Complaint  Patient presents with   Emesis    [name] is a 37 y.o. female.  HPI   37 year old female presents emergency department with complaints of pain in upper middle abdomen, nausea, vomiting.  Patient states she has a history of gastroparesis and this feels like a "flareup."  Has been seen numerous times in the past for a similar complaint with 5 times in the past month and a half.  Patient states that symptoms began around 11 AM when she was sitting in class.  Reports pain in her upper middle abdomen with subsequent nausea and vomiting.  States she takes Linzess but was unable to keep medication down which sometimes helps her symptoms.  States that she now feels pain all over her body.  Has not seen GI specifically for her gastroparesis per patient.  Denies any fever, chills, hematemesis, coffee-ground emesis, urinary symptoms, vaginal symptoms, change in bowel habits, chest pain, shortness of breath.  Denies any marijuana use, alcohol use.  States that the near for last menstrual cycle was on Saturday and is not concerned about pregnancy  Past medical history significant for diabetes mellitus type I, DKA, gastroparesis, medical noncompliance, intractable nausea and vomiting, GERD, medical noncompliance, chronic pain syndrome  Home Medications Prior to Admission medications   Medication Sig Start Date End Date Taking? Authorizing Provider  amitriptyline (ELAVIL) 25 MG tablet Take 75 mg by mouth at bedtime. 06/26/22   [provider]  Blood Glucose Monitoring Suppl DEVI 1 each by Does not apply route in the morning, at noon, and at bedtime. May substitute based on patient's preference and/or insurance. 04/06/23   Almon Hercules, MD  Continuous Blood Gluc Transmit (DEXCOM G6 TRANSMITTER) MISC Inject 1 each into the skin  every 14 (fourteen) days. 12/31/21   Dulce Sellar, NP  cyanocobalamin (VITAMIN B12) 1000 MCG/ML injection Inject 1,000 mcg into the muscle See admin instructions. 1000 mcg injected IM every Tuesday. 08/31/22   [provider]  DULoxetine (CYMBALTA) 60 MG capsule Take 60 mg by mouth daily. 06/28/22   [provider]  Glucose Blood (BLOOD GLUCOSE TEST STRIPS) STRP 1 each by In Vitro route in the morning, at noon, and at bedtime. May substitute based on patient's preference and/or insurance. 04/06/23 05/09/23  Almon Hercules, MD  insulin aspart (NOVOLOG) 100 UNIT/ML FlexPen Inject 0-18 Units into the skin 3 (three) times daily with meals. Check Blood Glucose (BG) and inject per scale: BG <150= 0 unit; BG 150-200= 3 unit; BG 201-250= 6 unit; BG 251-300= 9 unit; BG 301-350= 12 unit; BG 351-400= 15 unit; BG >400= 18 unit and Call Primary Care. 02/04/23 05/05/23  Dahal, Melina Schools, MD  insulin detemir (LEVEMIR FLEXTOUCH) 100 UNIT/ML FlexPen Inject 15 Units into the skin 2 (two) times daily. Patient taking differently: Inject 60 Units into the skin at bedtime. 02/04/23   Lorin Glass, MD  Insulin Pen Needle (B-D UF III MINI PEN NEEDLES) 31G X 5 MM MISC To use with Insulin pens, as directed 11/20/21   Dulce Sellar, NP  Insulin Syringes, Disposable, U-100 1 ML MISC Inject 1 each into the skin in the morning and at bedtime. 08/10/21   Dulce Sellar, NP  Lancet Device MISC 1 each by Does not apply route in the morning, at noon, and at bedtime. May substitute based on  patient's preference and/or insurance. 04/06/23 05/06/23  Almon Hercules, MD  Lancets Misc. MISC 1 each by Does not apply route in the morning, at noon, and at bedtime. May substitute based on patient's preference and/or insurance. 04/06/23 05/06/23  Almon Hercules, MD  metoCLOPramide (REGLAN) 10 MG tablet Take 1 tablet (10 mg total) by mouth 3 (three) times daily with meals for 14 days. 02/04/23 05/31/23  Lorin Glass, MD  oxyCODONE  (ROXICODONE) 15 MG immediate release tablet Take 15 mg by mouth every 6 (six) hours as needed for pain.    [provider]  pantoprazole (PROTONIX) 40 MG tablet Take 1 tablet (40 mg total) by mouth 2 (two) times daily. 04/05/23   Almon Hercules, MD  phentermine (ADIPEX-P) 37.5 MG tablet Take 37.5 mg by mouth daily.    [provider]      Allergies    Mirapex [pramipexole]    Review of Systems   Review of Systems  All other systems reviewed and are negative.   Physical Exam Updated Vital Signs BP (!) 147/93   Pulse (!) 116   Temp 98.5 F (36.9 C) (Oral)   Resp 19   Ht 5\' 6"  (1.676 m)   Wt 78.5 kg   SpO2 100%   BMI 27.92 kg/m  Physical Exam Vitals and nursing note reviewed.  Constitutional:      Appearance: She is well-developed.  HENT:     Head: Normocephalic and atraumatic.  Eyes:     Conjunctiva/sclera: Conjunctivae normal.  Cardiovascular:     Rate and Rhythm: Regular rhythm. Tachycardia present.     Heart sounds: No murmur heard. Pulmonary:     Effort: Pulmonary effort is normal. No respiratory distress.     Breath sounds: Normal breath sounds. No wheezing, rhonchi or rales.  Abdominal:     Palpations: Abdomen is soft.     Tenderness: There is abdominal tenderness.     Comments: Epigastric tenderness to palpation.  Musculoskeletal:        General: No swelling.     Cervical back: Neck supple.     Right lower leg: No edema.     Left lower leg: No edema.  Skin:    General: Skin is warm and dry.     Capillary Refill: Capillary refill takes less than 2 seconds.  Neurological:     Mental Status: She is alert.  Psychiatric:        Mood and Affect: Mood normal.     ED Results / Procedures / Treatments   Labs (all labs ordered are listed, but only abnormal results are displayed) Labs Reviewed  COMPREHENSIVE METABOLIC PANEL - Abnormal; Notable for the following components:      Result Value   Chloride 94 (*)    Glucose, Bld 416 (*)    Total  Protein 8.7 (*)    AST 10 (*)    Anion gap 19 (*)    All other components within normal limits  CBC WITH DIFFERENTIAL/PLATELET - Abnormal; Notable for the following components:   Platelets 403 (*)    Neutro Abs 8.9 (*)    All other components within normal limits  LIPASE, BLOOD - Abnormal; Notable for the following components:   Lipase <10 (*)    All other components within normal limits  URINALYSIS, ROUTINE W REFLEX MICROSCOPIC - Abnormal; Notable for the following components:   Specific Gravity, Urine 1.035 (*)    Glucose, UA >1,000 (*)    Hgb urine dipstick  SMALL (*)    Ketones, ur >80 (*)    Protein, ur TRACE (*)    All other components within normal limits  RAPID URINE DRUG SCREEN, HOSP PERFORMED - Abnormal; Notable for the following components:   Tetrahydrocannabinol POSITIVE (*)    All other components within normal limits  BASIC METABOLIC PANEL - Abnormal; Notable for the following components:   Chloride 96 (*)    Glucose, Bld 368 (*)    Anion gap 16 (*)    All other components within normal limits  CBG MONITORING, ED - Abnormal; Notable for the following components:   Glucose-Capillary 361 (*)    All other components within normal limits  CBG MONITORING, ED - Abnormal; Notable for the following components:   Glucose-Capillary 345 (*)    All other components within normal limits  CBG MONITORING, ED - Abnormal; Notable for the following components:   Glucose-Capillary 325 (*)    All other components within normal limits  CBG MONITORING, ED - Abnormal; Notable for the following components:   Glucose-Capillary 254 (*)    All other components within normal limits  SARS CORONAVIRUS 2 BY RT PCR  HCG, QUANTITATIVE, PREGNANCY  MAGNESIUM  BETA-HYDROXYBUTYRIC ACID  BETA-HYDROXYBUTYRIC ACID  I-STAT VENOUS BLOOD GAS, ED    EKG None  Radiology No results found.  Procedures .Critical Care  Performed by: Peter Garter, PA Authorized by: Peter Garter, PA    Critical care provider statement:    Critical care time (minutes):  64   Critical care was necessary to treat or prevent imminent or life-threatening deterioration of the following conditions:  Endocrine crisis (DKA)   Critical care was time spent personally by me on the following activities:  Development of treatment plan with patient or surrogate, discussions with consultants, evaluation of patient's response to treatment, examination of patient, ordering and review of laboratory studies, ordering and review of radiographic studies, ordering and performing treatments and interventions, pulse oximetry, re-evaluation of patient's condition and review of old charts   I assumed direction of critical care for this patient from another provider in my specialty: no       Medications Ordered in ED Medications  insulin regular, human (MYXREDLIN) 100 units/ 100 mL infusion (8.5 Units/hr Intravenous New Bag/Given 05/05/23 2213)  dextrose 5 % in lactated ringers infusion (has no administration in time range)  dextrose 50 % solution 0-50 mL (has no administration in time range)  potassium chloride 10 mEq in 100 mL IVPB (10 mEq Intravenous New Bag/Given 05/05/23 2217)  lactated ringers infusion ( Intravenous New Bag/Given 05/05/23 2148)  droperidol (INAPSINE) 2.5 MG/ML injection 1.25 mg (1.25 mg Intravenous Given 05/05/23 1651)  lactated ringers bolus 1,000 mL (0 mLs Intravenous Stopped 05/05/23 2148)  ketorolac (TORADOL) 15 MG/ML injection 15 mg (15 mg Intravenous Given 05/05/23 1654)  diphenhydrAMINE (BENADRYL) injection 25 mg (25 mg Intravenous Given 05/05/23 1656)  lactated ringers bolus 1,000 mL (0 mLs Intravenous Stopped 05/05/23 1930)  insulin aspart (novoLOG) injection 10 Units (10 Units Subcutaneous Given 05/05/23 1739)  dicyclomine (BENTYL) injection 20 mg (20 mg Intramuscular Given 05/05/23 1806)  metoCLOPramide (REGLAN) injection 10 mg (10 mg Intravenous Given 05/05/23 1804)  morphine (PF) 4 MG/ML  injection 4 mg (4 mg Intravenous Given 05/05/23 1925)  ondansetron (ZOFRAN) injection 4 mg (4 mg Intravenous Given 05/05/23 1925)    ED Course/ Medical Decision Making/ A&P Clinical Course as of 05/05/23 2245  Mon May 05, 2023  2130 [facility] hospitalist Dr. Julian Reil who  agreed with admission and assume further treatment/care. [CR]    Clinical Course User Index [CR] Peter Garter, PA                                 Medical Decision Making Amount and/or Complexity of Data Reviewed Labs: ordered.  Risk Prescription drug management. Decision regarding hospitalization.   This patient presents to the ED for concern of abdominal pain, nausea, vomiting, this involves an extensive number of treatment options, and is a complaint that carries with it a high risk of complications and morbidity.  The differential diagnosis includes gastroparesis, gastritis, PUD, SBO/LBO, volvulus, CBD pathology, cholecystitis, pyelonephritis, nephrolithiasis, cystitis, other   Co morbidities that complicate the patient evaluation  See HPI   Additional history obtained:  Additional history obtained from EMR External records from outside source obtained and reviewed including hospital records   Lab Tests:  I Ordered, and personally interpreted labs.  The pertinent results include: Initial CBG of 356.  No leukocytosis.  No evidence of anemia.  Thrombocytosis of 4 3.  UA with greater than 80 ketones, greater than thousand glucose, trace proteins and small hemoglobin.  Hypochloremia of 94 otherwise, electrolytes within normal limits.  No transaminitis.  No renal dysfunction.  Patient with anion gap of 19.  Lipase negative.  UDS positive for THC.  COVID-negative.  hCG negative.  Repeat CBG of 361.  Repeat B MP with still anion gap of 16.   Imaging Studies ordered:  N/a   Cardiac Monitoring: / EKG:  The patient was maintained on a cardiac monitor.  I personally viewed and interpreted the cardiac  monitored which showed an underlying rhythm of: Sinus rhythm   Consultations Obtained:  N/a   Problem List / ED Course / Critical interventions / Medication management  Gastroparesis, dehydration I ordered medication including Benadryl, droperidol, Toradol, lactated Ringer's   Reevaluation of the patient after these medicines showed that the patient improved I have reviewed the patients home medicines and have made adjustments as needed   Social Determinants of Health:  Denies tobacco, illicit drug use   Test / Admission - Considered:  Gastroparesis, dehydration Vitals signs significant for initial tachycardia with a heart rate of 118 as well as hypertension with blood pressure 167 systolic of which remained tachycardic. Otherwise within normal range and stable throughout visit. Laboratory/imaging studies significant for: See above 37 year old female presents emergency department with complaints of abdominal pain, nausea, vomiting.  Patient with pretty extensive history of THC use as well as gastroparesis and medical noncompliance due to diabetic medications in the outpatient setting.  On exam initially, patient with tenderness in epigastric region.  Patient's laboratory studies concerning for DKA given initial evidence of CBG in the 300s, greater than 80 ketones in urine with anion gap of 19.  Unsure initially whether or not patient's ketones/anion gap related to dehydration given her persistent nausea/emesis or if related to diabetes.  Initially tried intravenous fluids, antiemetics without significant improvement of symptoms.  Trialed multiple antiemetics while in the ED without improvement of nausea/emesis.  Patient's CBG did improve but without closure of anion gap.  Will admit to hospitalist given concern for DKA/intractable nausea/vomiting. Treatment plan discussed at length with patient and she acknowledged understanding was agreeable to admission to the  hospital.        Final Clinical Impression(s) / ED Diagnoses Final diagnoses:  Intractable nausea and vomiting  Diabetic ketoacidosis without coma  associated with type 1 diabetes mellitus [facility])    Rx / DC Orders ED Discharge Orders     None         Peter Garter, [place] 05/05/23 2245    Melene Plan, DO 05/05/23 2258

## 2023-05-05 NOTE — ED Notes (Signed)
CBG done without arm band/result:  356mg /dl reported to Wendy Poet RN

## 2023-05-05 NOTE — ED Triage Notes (Signed)
Pt c/o "same thing as last time," abd pain "everywhere" & vomiting- "my body is on fire." Seen multiple times for same, hx gastroparesis

## 2023-05-05 NOTE — ED Notes (Signed)
CBG check at this time 243.

## 2023-05-05 NOTE — ED Notes (Signed)
Called report to [facility] ICU but RN unavailable and prompted to return call in 10 minutes.

## 2023-05-06 DIAGNOSIS — E1110 Type 2 diabetes mellitus with ketoacidosis without coma: Secondary | ICD-10-CM | POA: Diagnosis present

## 2023-05-06 DIAGNOSIS — Z8249 Family history of ischemic heart disease and other diseases of the circulatory system: Secondary | ICD-10-CM | POA: Diagnosis not present

## 2023-05-06 DIAGNOSIS — E1043 Type 1 diabetes mellitus with diabetic autonomic (poly)neuropathy: Secondary | ICD-10-CM | POA: Diagnosis present

## 2023-05-06 DIAGNOSIS — E860 Dehydration: Secondary | ICD-10-CM | POA: Diagnosis present

## 2023-05-06 DIAGNOSIS — Z888 Allergy status to other drugs, medicaments and biological substances status: Secondary | ICD-10-CM | POA: Diagnosis not present

## 2023-05-06 DIAGNOSIS — R112 Nausea with vomiting, unspecified: Secondary | ICD-10-CM

## 2023-05-06 DIAGNOSIS — E1010 Type 1 diabetes mellitus with ketoacidosis without coma: Secondary | ICD-10-CM | POA: Diagnosis present

## 2023-05-06 DIAGNOSIS — F419 Anxiety disorder, unspecified: Secondary | ICD-10-CM | POA: Diagnosis present

## 2023-05-06 DIAGNOSIS — Z1152 Encounter for screening for COVID-19: Secondary | ICD-10-CM | POA: Diagnosis not present

## 2023-05-06 DIAGNOSIS — K219 Gastro-esophageal reflux disease without esophagitis: Secondary | ICD-10-CM | POA: Diagnosis present

## 2023-05-06 DIAGNOSIS — Z23 Encounter for immunization: Secondary | ICD-10-CM | POA: Diagnosis not present

## 2023-05-06 DIAGNOSIS — Z794 Long term (current) use of insulin: Secondary | ICD-10-CM | POA: Diagnosis not present

## 2023-05-06 DIAGNOSIS — Z833 Family history of diabetes mellitus: Secondary | ICD-10-CM | POA: Diagnosis not present

## 2023-05-06 DIAGNOSIS — G894 Chronic pain syndrome: Secondary | ICD-10-CM | POA: Diagnosis present

## 2023-05-06 DIAGNOSIS — K3184 Gastroparesis: Secondary | ICD-10-CM | POA: Diagnosis present

## 2023-05-06 DIAGNOSIS — Z79899 Other long term (current) drug therapy: Secondary | ICD-10-CM | POA: Diagnosis not present

## 2023-05-06 DIAGNOSIS — E1065 Type 1 diabetes mellitus with hyperglycemia: Secondary | ICD-10-CM

## 2023-05-06 DIAGNOSIS — Z83511 Family history of glaucoma: Secondary | ICD-10-CM | POA: Diagnosis not present

## 2023-05-06 LAB — BASIC METABOLIC PANEL
Anion gap: 8 (ref 5–15)
Anion gap: 9 (ref 5–15)
BUN: 17 mg/dL (ref 6–20)
BUN: 17 mg/dL (ref 6–20)
CO2: 24 mmol/L (ref 22–32)
CO2: 26 mmol/L (ref 22–32)
Calcium: 9 mg/dL (ref 8.9–10.3)
Calcium: 9 mg/dL (ref 8.9–10.3)
Chloride: 102 mmol/L (ref 98–111)
Chloride: 102 mmol/L (ref 98–111)
Creatinine, Ser: 0.62 mg/dL (ref 0.44–1.00)
Creatinine, Ser: 0.67 mg/dL (ref 0.44–1.00)
GFR, Estimated: >60 mL/min (ref >60)
GFR, Estimated: >60 mL/min (ref >60)
Glucose, Bld: 159 mg/dL — ABNORMAL HIGH (ref 70–99)
Glucose, Bld: 158 mg/dL — ABNORMAL HIGH (ref 70–99)
Potassium: 4.1 mmol/L (ref 3.5–5.1)
Potassium: 3.5 mmol/L (ref 3.5–5.1)
Sodium: 134 mmol/L — ABNORMAL LOW (ref 135–145)
Sodium: 137 mmol/L (ref 135–145)

## 2023-05-06 LAB — GLUCOSE, CAPILLARY
Glucose-Capillary: 132 mg/dL — ABNORMAL HIGH (ref 70–99)
Glucose-Capillary: 135 mg/dL — ABNORMAL HIGH (ref 70–99)
Glucose-Capillary: 135 mg/dL — ABNORMAL HIGH (ref 70–99)
Glucose-Capillary: 138 mg/dL — ABNORMAL HIGH (ref 70–99)
Glucose-Capillary: 149 mg/dL — ABNORMAL HIGH (ref 70–99)
Glucose-Capillary: 149 mg/dL — ABNORMAL HIGH (ref 70–99)
Glucose-Capillary: 149 mg/dL — ABNORMAL HIGH (ref 70–99)
Glucose-Capillary: 150 mg/dL — ABNORMAL HIGH (ref 70–99)
Glucose-Capillary: 154 mg/dL — ABNORMAL HIGH (ref 70–99)
Glucose-Capillary: 160 mg/dL — ABNORMAL HIGH (ref 70–99)
Glucose-Capillary: 197 mg/dL — ABNORMAL HIGH (ref 70–99)
Glucose-Capillary: 211 mg/dL — ABNORMAL HIGH (ref 70–99)

## 2023-05-06 LAB — MRSA NEXT GEN BY PCR, NASAL: MRSA by PCR Next Gen: DETECTED — AB

## 2023-05-06 LAB — BETA-HYDROXYBUTYRIC ACID
Beta-Hydroxybutyric Acid: 1.96 mmol/L — ABNORMAL HIGH (ref 0.05–0.27)
Beta-Hydroxybutyric Acid: 0.59 mmol/L — ABNORMAL HIGH (ref 0.05–0.27)

## 2023-05-06 MED ORDER — CHLORHEXIDINE GLUCONATE CLOTH 2 % EX PADS
6.0000 | MEDICATED_PAD | Freq: Every day | CUTANEOUS | Status: DC
  Administered 2023-05-06: 6 via TOPICAL

## 2023-05-06 MED ORDER — INSULIN ASPART 100 UNIT/ML IJ SOLN
0.0000 [IU] | Freq: Every day | INTRAMUSCULAR | Status: DC
  Administered 2023-05-06: 2 [IU] via SUBCUTANEOUS

## 2023-05-06 MED ORDER — INFLUENZA VIRUS VACC SPLIT PF (FLUZONE) 0.5 ML IM SUSY
0.5000 mL | PREFILLED_SYRINGE | INTRAMUSCULAR | Status: AC
  Administered 2023-05-07: 0.5 mL via INTRAMUSCULAR
  Filled 2023-05-06: qty 0.5

## 2023-05-06 MED ORDER — MUPIROCIN 2 % EX OINT
1.0000 | TOPICAL_OINTMENT | Freq: Two times a day (BID) | CUTANEOUS | Status: DC
  Administered 2023-05-06 – 2023-05-07 (×3): 1 via NASAL
  Filled 2023-05-06 (×2): qty 22

## 2023-05-06 MED ORDER — INSULIN ASPART 100 UNIT/ML IJ SOLN
0.0000 [IU] | Freq: Three times a day (TID) | INTRAMUSCULAR | Status: DC
  Administered 2023-05-06 (×2): 2 [IU] via SUBCUTANEOUS
  Administered 2023-05-07: 3 [IU] via SUBCUTANEOUS

## 2023-05-06 MED ORDER — ACETAMINOPHEN 325 MG PO TABS: 650.0000 mg | ORAL_TABLET | Freq: Four times a day (QID) | ORAL | Status: DC | PRN

## 2023-05-06 MED ORDER — INSULIN GLARGINE-YFGN 100 UNIT/ML ~~LOC~~ SOLN
15.0000 [IU] | Freq: Every day | SUBCUTANEOUS | Status: DC
  Administered 2023-05-06 – 2023-05-07 (×2): 15 [IU] via SUBCUTANEOUS
  Filled 2023-05-06 (×2): qty 0.15

## 2023-05-06 MED ORDER — OXYCODONE HCL 5 MG PO TABS
ORAL_TABLET | ORAL | Status: AC
  Filled 2023-05-06: qty 1

## 2023-05-06 MED ORDER — MORPHINE SULFATE (PF) 2 MG/ML IV SOLN
2.0000 mg | INTRAVENOUS | Status: AC | PRN
  Administered 2023-05-06 (×3): 2 mg via INTRAVENOUS
  Filled 2023-05-06 (×3): qty 1

## 2023-05-06 MED ORDER — DULOXETINE HCL 30 MG PO CPEP
60.0000 mg | ORAL_CAPSULE | Freq: Every day | ORAL | Status: DC
  Administered 2023-05-06 – 2023-05-07 (×2): 60 mg via ORAL
  Filled 2023-05-06 (×2): qty 2

## 2023-05-06 MED ORDER — INSULIN REGULAR(HUMAN) IN NACL 100-0.9 UT/100ML-% IV SOLN: INTRAVENOUS | Status: DC

## 2023-05-06 MED ORDER — OXYCODONE HCL 5 MG PO TABS
5.0000 mg | ORAL_TABLET | Freq: Four times a day (QID) | ORAL | Status: AC | PRN
  Administered 2023-05-06 – 2023-05-07 (×2): 5 mg via ORAL
  Filled 2023-05-06: qty 1

## 2023-05-06 MED ORDER — ENOXAPARIN SODIUM 40 MG/0.4ML IJ SOSY
40.0000 mg | PREFILLED_SYRINGE | INTRAMUSCULAR | Status: DC
  Administered 2023-05-06 – 2023-05-07 (×2): 40 mg via SUBCUTANEOUS
  Filled 2023-05-06 (×2): qty 0.4

## 2023-05-06 MED ORDER — METOCLOPRAMIDE HCL 5 MG/ML IJ SOLN
10.0000 mg | Freq: Three times a day (TID) | INTRAMUSCULAR | Status: DC
  Administered 2023-05-06 – 2023-05-07 (×5): 10 mg via INTRAVENOUS
  Filled 2023-05-06 (×5): qty 2

## 2023-05-06 MED ORDER — PROCHLORPERAZINE EDISYLATE 10 MG/2ML IJ SOLN
10.0000 mg | Freq: Four times a day (QID) | INTRAMUSCULAR | Status: DC | PRN
  Administered 2023-05-06 (×2): 10 mg via INTRAVENOUS
  Filled 2023-05-06 (×2): qty 2

## 2023-05-06 NOTE — Plan of Care (Signed)
Problem: Cardiac: Goal: Ability to maintain an adequate cardiac output will improve Outcome: Progressing   Problem: Health Behavior/Discharge Planning: Goal: Ability to identify and utilize available resources and services will improve Outcome: Progressing Goal: Ability to manage health-related needs will improve Outcome: Progressing

## 2023-05-06 NOTE — Plan of Care (Signed)
Patient seen and examined this morning in the ICU.  37 year old female with type 1 diabetes admitted with nausea/vomiting.  Also has gastroparesis and chronic pains.  Nausea/vomiting may have been exacerbated from [facility] use and flareup of neuropathy pain.  She was found to have DKA on workup and was admitted for treatment with insulin drip and fluids. She was improved this morning and diet was resumed and advanced. Transitioned off of insulin drip back to subcutaneous insulins.  Plan: - advance diet today and monitor for tolerance - resume SQ insulin; adjust as necessary; home regimen may need adjustment  - encouraged abstaining from [facility] use - probable d/c home Wednesday  Lewie Chamber, MD [facility] 05/06/2023, 12:47 PM

## 2023-05-06 NOTE — Assessment & Plan Note (Signed)
Last A1c 12.2%.  Hyperglycemic on arrival with serum glucose 416, AG 19, bicarb 22, UA with >80 ketones, beta-hydroxybutyrate 1.96.  She has been started on insulin infusion with improving blood sugars. -Continue IV insulin infusion per protocol -Continue IV fluids as ordered -Follow serial BMET and transition to subcutaneous insulin when able

## 2023-05-06 NOTE — Hospital Course (Signed)
[name] is a 37 y.o. 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  Started on insulin infusion and admitted to stepdown unit.

## 2023-05-06 NOTE — Assessment & Plan Note (Signed)
Continue Cymbalta.

## 2023-05-06 NOTE — TOC Progression Note (Signed)
Transition of Care [facility]) - Inpatient Brief Assessment  Patient Details  Name: [name] MRN: [id number] Date of Birth: 08-11-1986  Transition of [facility]) CM/SW Contact:    Ewing Schlein, LCSW Phone Number: 05/06/2023, 12:57 PM  Clinical Narrative: TOC screening completed. No discharge needs identified at this time.  Transition of Care Asessment: Insurance and Status: Insurance coverage has been reviewed Patient has primary care physician: Yes Home environment has been reviewed: Resides with spouse Prior level of function:: Independent at baseline Prior/Current Home Services: No current home services Social Determinants of Health Reivew: SDOH reviewed no interventions necessary Readmission risk has been reviewed: Yes Transition of care needs: no transition of care needs at this time  Social Determinants of Health (SDOH) Interventions SDOH Screenings   Food Insecurity: No Food Insecurity (05/06/2023)  Housing: Low Risk  (05/06/2023)  Transportation Needs: No Transportation Needs (05/06/2023)  Utilities: Not At Risk (05/06/2023)  Depression (PHQ2-9): Low Risk  (11/08/2021)  Social Connections: Unknown (01/30/2022)   Received from [facility]  Tobacco Use: Low Risk  (05/05/2023)   Readmission Risk Interventions    05/06/2023   12:55 PM 02/04/2023   11:55 AM  Readmission Risk Prevention Plan  Transportation Screening Complete Complete  Medication Review [employer]) Complete Complete  PCP or Specialist appointment within 3-5 days of discharge  Complete  HRI or Home Care Consult Complete Complete  SW Recovery Care/Counseling Consult Complete   Palliative Care Screening Not Applicable   Skilled Nursing Facility Not Applicable

## 2023-05-06 NOTE — H&P (Signed)
History and Physical    [name] YNW:[id number] DOB: September 24, 1985 DOA: 05/05/2023  PCP: Dulce Sellar, NP  Patient coming from: Home  I have personally briefly reviewed patient's old medical records in [facility]  Chief Complaint: Abdominal pain, nausea, vomiting  HPI: [name] is a 37 y.o. 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  Patient reports recurrence of generalized abdominal pain with frequent nausea and vomiting beginning around 11 AM on 05/05/2023.  She states that she has been taking her insulin regularly, Levemir 60 units BID plus NovoLog on sliding scale basis.  She reports flareup of her neuropathy affecting her whole body.  She denies chest pain, dyspnea, dysuria.  She denies any recent alcohol or marijuana use.  She says the only other medications she is taking regularly are Linzess for gastropathy and Cymbalta for anxiety.  [facility] ED Course  Labs/Imaging on admission: I have personally reviewed following labs and imaging studies.  BP 150/98, pulse 116, RR 16, temp 97.9 F, SpO2 100% on room air.  Labs showed sodium 135, potassium 4.3, bicarb 22, BUN 19, creatinine 0.84, serum glucose 416, anion gap 19, beta hydroxybutyrate 1.96, WBC 10.4, hemoglobin 13.7, platelets 403,000, lipase <10, magnesium 2.1.  VBG pH 7.357, pCO2 46.9, pO2 58.  Urinalysis negative for UTI.  UDS positive for THC.  SARS-CoV-2 PCR negative.  Quantitative hCG is <1.  Patient was given 1 L LR, IV morphine, IV Toradol, IV droperidol, 10 units subcutaneous NovoLog, IM Bentyl, IV Reglan and Zofran.  Patient was started on IV insulin drip and given IV K 10 mEq x 2.  The hospitalist service was consulted to admit for further evaluation and  management.  Review of Systems: All systems reviewed and are negative except as documented in history of present illness above.   Past Medical History:  Diagnosis Date   Chronic constipation 11/08/2021   Diabetic neuropathy (HCC)    History of chicken pox    History of noncompliance with medical treatment 09/07/2022   Type 1 diabetes (HCC)    Vaginal odor 11/08/2021    Past Surgical History:  Procedure Laterality Date   CESAREAN SECTION     only one C/S per pt.   CO2 LASER APPLICATION N/A 02/22/2022   Procedure: CO2 LASER APPLICATION TO VULVA;  Surgeon: Carver Fila, MD;  Location: [facility];  Service: General;  Laterality: N/A;   DILATION AND CURETTAGE OF UTERUS     EXCISION OF SKIN TAG N/A 02/22/2022   Procedure: EXCISION OF ANAL SKIN TAGS;  Surgeon: Romie Levee, MD;  Location: [facility];  Service: General;  Laterality: N/A;    Social History:  reports that she has never smoked. She has never been exposed to tobacco smoke. She has never used smokeless tobacco. She reports that she does not drink alcohol and does not use drugs.  Allergies  Allergen Reactions   Mirapex [Pramipexole] Shortness Of Breath    Family History  Problem Relation Age of Onset   Hypertension Mother    Glaucoma Mother    Diabetes Maternal Grandmother    Neuropathy Neg Hx    Colon cancer Neg Hx    Stomach cancer Neg Hx    Esophageal cancer Neg Hx    Colon polyps Neg Hx  Rectal cancer Neg Hx      Prior to Admission medications   Medication Sig Start Date End Date Taking? Authorizing Provider  amitriptyline (ELAVIL) 25 MG tablet Take 75 mg by mouth at bedtime. 06/26/22   [provider]  Blood Glucose Monitoring Suppl DEVI 1 each by Does not apply route in the morning, at noon, and at bedtime. May substitute based on patient's preference and/or insurance. 04/06/23   Almon Hercules, MD  Continuous Blood Gluc Transmit (DEXCOM G6 TRANSMITTER) MISC Inject  1 each into the skin every 14 (fourteen) days. 12/31/21   Dulce Sellar, NP  cyanocobalamin (VITAMIN B12) 1000 MCG/ML injection Inject 1,000 mcg into the muscle See admin instructions. 1000 mcg injected IM every Tuesday. 08/31/22   [provider]  DULoxetine (CYMBALTA) 60 MG capsule Take 60 mg by mouth daily. 06/28/22   [provider]  Glucose Blood (BLOOD GLUCOSE TEST STRIPS) STRP 1 each by In Vitro route in the morning, at noon, and at bedtime. May substitute based on patient's preference and/or insurance. 04/06/23 05/09/23  Almon Hercules, MD  insulin aspart (NOVOLOG) 100 UNIT/ML FlexPen Inject 0-18 Units into the skin 3 (three) times daily with meals. Check Blood Glucose (BG) and inject per scale: BG <150= 0 unit; BG 150-200= 3 unit; BG 201-250= 6 unit; BG 251-300= 9 unit; BG 301-350= 12 unit; BG 351-400= 15 unit; BG >400= 18 unit and Call Primary Care. 02/04/23 05/05/23  Dahal, Melina Schools, MD  insulin detemir (LEVEMIR FLEXTOUCH) 100 UNIT/ML FlexPen Inject 15 Units into the skin 2 (two) times daily. Patient taking differently: Inject 60 Units into the skin at bedtime. 02/04/23   Lorin Glass, MD  Insulin Pen Needle (B-D UF III MINI PEN NEEDLES) 31G X 5 MM MISC To use with Insulin pens, as directed 11/20/21   Dulce Sellar, NP  Insulin Syringes, Disposable, U-100 1 ML MISC Inject 1 each into the skin in the morning and at bedtime. 08/10/21   Dulce Sellar, NP  Lancet Device MISC 1 each by Does not apply route in the morning, at noon, and at bedtime. May substitute based on patient's preference and/or insurance. 04/06/23 05/06/23  Almon Hercules, MD  Lancets Misc. MISC 1 each by Does not apply route in the morning, at noon, and at bedtime. May substitute based on patient's preference and/or insurance. 04/06/23 05/06/23  Almon Hercules, MD  metoCLOPramide (REGLAN) 10 MG tablet Take 1 tablet (10 mg total) by mouth 3 (three) times daily with meals for 14 days. 02/04/23 05/31/23  Lorin Glass, MD  oxyCODONE (ROXICODONE) 15 MG immediate release tablet Take 15 mg by mouth every 6 (six) hours as needed for pain.    [provider]  pantoprazole (PROTONIX) 40 MG tablet Take 1 tablet (40 mg total) by mouth 2 (two) times daily. 04/05/23   Almon Hercules, MD  phentermine (ADIPEX-P) 37.5 MG tablet Take 37.5 mg by mouth daily.    [provider]    Physical Exam: Vitals:   05/05/23 2130 05/05/23 2200 05/05/23 2250 05/06/23 0000  BP: (!) 149/97 (!) 147/93 105/67 134/86  Pulse: (!) 116  (!) 109 (!) 112  Resp:   14 12  Temp:    98.3 F (36.8 C)  TempSrc:    Oral  SpO2: 100%  99% 98%  Weight:    78.8 kg  Height:    5\' 6"  (1.676 m)   Constitutional: Who is in bed, NAD, calm, comfortable Eyes: EOMI, lids and  conjunctivae normal ENMT: Mucous membranes are moist. Posterior pharynx clear of any exudate or lesions.Normal dentition.  Neck: normal, supple, no masses. Respiratory: clear to auscultation bilaterally, no wheezing, no crackles. Normal respiratory effort. No accessory muscle use.  Cardiovascular: Tachycardic, no murmurs / rubs / gallops. No extremity edema. 2+ pedal pulses. Abdomen: Mild generalized tenderness, no masses palpated.  Musculoskeletal: no clubbing / cyanosis. No joint deformity upper and lower extremities. Good ROM, no contractures. Normal muscle tone.  Skin: no rashes, lesions, ulcers. No induration Neurologic: Sensation intact. Strength 5/5 in all 4.  Psychiatric: Alert and oriented x 3. Normal mood.   EKG: Not performed.  Assessment/Plan Principal Problem:   Uncontrolled type 1 diabetes mellitus with hyperglycemia (HCC) Active Problems:   Nausea and vomiting   Anxiety   [name] is a 37 y.o. 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  Started on insulin infusion and admitted to stepdown unit.  Assessment and Plan: * Uncontrolled type 1 diabetes mellitus with hyperglycemia (HCC) Last A1c 12.2%.  Hyperglycemic on arrival with serum glucose 416, AG 19, bicarb 22, UA with >80 ketones, beta-hydroxybutyrate 1.96.  She has been started on insulin infusion with improving blood sugars. -Continue IV insulin infusion per protocol -Continue IV fluids as ordered -Follow serial BMET and transition to subcutaneous insulin when able  Nausea and vomiting Recurrent intractable nausea and vomiting likely multifactorial related to hyperglycemia, diabetes associated gastropathy, and THC use. -Continue IV fluids and correction of hyperglycemia as above -Scheduled IV Reglan 10 mg q8h with Compazine as needed  Anxiety Continue Cymbalta.  DVT prophylaxis: enoxaparin (LOVENOX) injection 40 mg Start: 05/06/23 1000 Code Status: Full code Family Communication: Discussed with patient, she has discussed with family Disposition Plan: From home and likely discharge to home pending clinical progress Consults called: None Severity of Illness: The appropriate patient status for this patient is OBSERVATION. Observation status is judged to be reasonable and necessary in order to provide the required intensity of service to ensure the patient's safety. The patient's presenting symptoms, physical exam findings, and initial radiographic and laboratory data in the context of their medical condition is felt to place them at decreased risk for further clinical deterioration. Furthermore, it is anticipated that the patient will be medically stable for discharge from the hospital within 2 midnights of admission.   Darreld Mclean MD [facility]  If 7PM-7AM, please contact night-coverage [email or web address]  05/06/2023, 1:02 AM

## 2023-05-06 NOTE — Inpatient Diabetes Management (Signed)
Inpatient Diabetes Program Recommendations  AACE/ADA: New Consensus Statement on Inpatient Glycemic Control (2015)  Target Ranges:  Prepandial:   less than 140 mg/dL      Peak postprandial:   less than 180 mg/dL (1-2 hours)      Critically ill patients:  140 - 180 mg/dL   Lab Results  Component Value Date   GLUCAP 154 (H) 05/06/2023   HGBA1C 12.2 (H) 12/30/2022    Review of Glycemic Control  Diabetes history: DM1 Outpatient Diabetes medications: Levemir 15 BID, Novolog 0-18 TID Current orders for Inpatient glycemic control: Semglee 15 every day, Novolog 0-9 units TID with meals and 0-5 HS.  Inpatient Diabetes Program Recommendations:    Consider adding Novolog 4 units TID with meals if eating > 50%  Spoke with pt regarding her diabetes management prior to admission. States she did not miss her basal insulin, although blood sugars were rising and she started with nausea and vomiting. Wears Dexcom G7 CGM. Has been educated multiple times regarding DKA, role of pancreas, survival skill, interventions, sick day rules, importance of covering and correcting even while vomiting if necessary, vascular changes and commorbidities. Discussed HgbA1C of 12.2% (average blood sugar of 300 mg/dL). Instructed to f/u with PCP, discussed complications from uncontrolled blood sugars.  Continue to follow while inpatient.  Thank you. Ailene Ards, RD, LDN, CDCES Inpatient Diabetes Coordinator [phone]

## 2023-05-06 NOTE — Assessment & Plan Note (Signed)
Recurrent intractable nausea and vomiting likely multifactorial related to hyperglycemia, diabetes associated gastropathy, and THC use. -Continue IV fluids and correction of hyperglycemia as above -Scheduled IV Reglan 10 mg q8h with Compazine as needed

## 2023-05-07 ENCOUNTER — Other Ambulatory Visit (HOSPITAL_COMMUNITY): Payer: Self-pay

## 2023-05-07 DIAGNOSIS — E1065 Type 1 diabetes mellitus with hyperglycemia: Secondary | ICD-10-CM | POA: Diagnosis not present

## 2023-05-07 LAB — GLUCOSE, CAPILLARY: Glucose-Capillary: 221 mg/dL — ABNORMAL HIGH (ref 70–99)

## 2023-05-07 MED ORDER — CHLORHEXIDINE GLUCONATE CLOTH 2 % EX PADS: 6.0000 | MEDICATED_PAD | Freq: Every day | CUTANEOUS | Status: DC

## 2023-05-07 MED ORDER — METOCLOPRAMIDE HCL 10 MG PO TABS
ORAL_TABLET | ORAL | 0 refills | Status: DC
Start: 2023-05-07 — End: 2023-05-10
  Filled 2023-05-07: qty 30, 10d supply, fill #0

## 2023-05-07 NOTE — Discharge Summary (Signed)
Physician Discharge Summary  [name] NWG:[id number] DOB: 30-Mar-1986 DOA: 05/05/2023  PCP: Dulce Sellar, NP  Admit date: 05/05/2023 Discharge date: 05/07/2023  Admitted From: home Discharge disposition: home   Recommendations for Outpatient Follow-Up:   Work on blood sugar control Marijuana cessation    Discharge Diagnosis:   Principal Problem:   Uncontrolled type 1 diabetes mellitus with hyperglycemia (HCC) Active Problems:   Nausea and vomiting   Anxiety   DKA (diabetic ketoacidosis) (HCC)    Discharge Condition: Improved.  Diet recommendation: Carbohydrate-modified.  R  Wound care: None.  Code status: Full.   History of Present Illness:   [name] is a 37 y.o. 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   Patient reports recurrence of generalized abdominal pain with frequent nausea and vomiting beginning around 11 AM on 05/05/2023.  She states that she has been taking her insulin regularly, Levemir 60 units BID plus NovoLog on sliding scale basis.  She reports flareup of her neuropathy affecting her whole body.   She denies chest pain, dyspnea, dysuria.  She denies any recent alcohol or marijuana use.  She says the only other medications she is taking regularly are Linzess for gastropathy and Cymbalta for anxiety.   Hospital Course by Problem:     37 year old female with type 1 diabetes admitted with nausea/vomiting.  Also has gastroparesis and chronic pains.  Nausea/vomiting may have been exacerbated from [facility] use and flareup of neuropathy pain.  She was found to have DKA on workup and was admitted for treatment with insulin drip and fluids. She was improved and tolerating diet was resumed and advanced. Transitioned  off of insulin drip back to subcutaneous insulins. - resume SQ insulin; adjust as necessary; home regimen may need adjustment  - encouraged abstaining from [facility] use -prn reglan    Medical Consultants:      Discharge Exam:   Vitals:   05/06/23 2044 05/07/23 0533  BP: 118/75 111/77  Pulse: 94 97  Resp: 18 16  Temp: 98.4 F (36.9 C) 98.3 F (36.8 C)  SpO2: 99% 97%   Vitals:   05/06/23 1900 05/06/23 2000 05/06/23 2044 05/07/23 0533  BP:  127/83 118/75 111/77  Pulse:  (!) 102 94 97  Resp:  13 18 16   Temp: 98 F (36.7 C)  98.4 F (36.9 C) 98.3 F (36.8 C)  TempSrc: Oral     SpO2:  100% 99% 97%  Weight:      Height:        General exam: Appears calm and comfortable.    The results of significant diagnostics from this hospitalization (including imaging, microbiology, ancillary and laboratory) are listed below for reference.     Procedures and Diagnostic Studies:   No results found.   Labs:   Basic Metabolic Panel: Recent Labs  Lab 05/01/23 1855 05/01/23 1917 05/05/23 1552 05/05/23 1832 05/05/23 2230 05/06/23 0121 05/06/23 0620 05/07/23 0550  NA 128*   < > 135 135 136 134* 137 136  K 4.5   < > 4.3 4.9 4.3 4.1 3.5 3.8  CL 87*  --  94* 96*  --  102 102 101  CO2 27  --  22 23  --  24 26 26   GLUCOSE 449*  --  416* 368*  --  159* 158* 203*  BUN 16  --  19 18  --  17 17 14   CREATININE 1.36*  --  0.84 0.73  --  0.62 0.67 0.70  CALCIUM 9.9  --  10.0 9.5  --  9.0 9.0 8.9  MG 2.3  --  2.1  --   --   --   --  2.0   < > = values in this interval not displayed.   GFR Estimated Creatinine Clearance: 103 mL/min (by C-G formula based on SCr of 0.7 mg/dL). Liver Function Tests: Recent Labs  Lab 05/01/23 1855 05/05/23 1552  AST 12* 10*  ALT 9 9  ALKPHOS 97 98  BILITOT 0.5 0.5  PROT 8.7* 8.7*  ALBUMIN 4.5 4.7   Recent Labs  Lab 05/01/23 1855 05/05/23 1552  LIPASE <10* <10*   No results for input(s): "AMMONIA" in the last 168 hours. Coagulation  profile No results for input(s): "INR", "PROTIME" in the last 168 hours.  CBC: Recent Labs  Lab 05/01/23 1855 05/01/23 1917 05/05/23 1552 05/05/23 2230 05/07/23 0550  WBC 9.5  --  10.4  --  6.5  NEUTROABS 5.2  --  8.9*  --  3.0  HGB 14.8 14.3 13.7 11.2* 11.8*  HCT 41.8 42.0 40.0 33.0* 35.8*  MCV 87.4  --  89.3  --  94.7  PLT 361  --  403*  --  314   Cardiac Enzymes: No results for input(s): "CKTOTAL", "CKMB", "CKMBINDEX", "TROPONINI" in the last 168 hours. BNP: Invalid input(s): "POCBNP" CBG: Recent Labs  Lab 05/06/23 0951 05/06/23 1116 05/06/23 1606 05/06/23 2042 05/07/23 0737  GLUCAP 132* 154* 197* 211* 221*   D-Dimer No results for input(s): "DDIMER" in the last 72 hours. Hgb A1c Recent Labs    05/06/23 0832  HGBA1C 11.9*   Lipid Profile No results for input(s): "CHOL", "HDL", "LDLCALC", "TRIG", "CHOLHDL", "LDLDIRECT" in the last 72 hours. Thyroid function studies No results for input(s): "TSH", "T4TOTAL", "T3FREE", "THYROIDAB" in the last 72 hours.  Invalid input(s): "FREET3" Anemia work up No results for input(s): "VITAMINB12", "FOLATE", "FERRITIN", "TIBC", "IRON", "RETICCTPCT" in the last 72 hours. Microbiology Recent Results (from the past 240 hour(s))  SARS Coronavirus 2 by RT PCR (hospital order, performed in [facility] hospital lab) *cepheid single result test* Anterior Nasal Swab     Status: None   Collection Time: 05/05/23  3:52 PM   Specimen: Anterior Nasal Swab  Result Value Ref Range Status   SARS Coronavirus 2 by RT PCR NEGATIVE NEGATIVE Final    Comment: (NOTE) SARS-CoV-2 target nucleic acids are NOT DETECTED.  The SARS-CoV-2 RNA is generally detectable in upper and lower respiratory specimens during the acute phase of infection. The lowest concentration of SARS-CoV-2 viral copies this assay can detect is 250 copies / mL. A negative result does not preclude SARS-CoV-2 infection and should not be used as the sole basis for treatment or  other patient management decisions.  A negative result may occur with improper specimen collection / handling, submission of specimen other than nasopharyngeal swab, presence of viral mutation(s) within the areas targeted by this assay, and inadequate number of viral copies (<250 copies / mL). A negative result must be combined with clinical observations, patient history, and epidemiological information.  Fact Sheet for Patients:   RoadLapTop.co.za  Fact Sheet for Healthcare Providers: http://kim-miller.com/  This test is not yet approved or  cleared by the [place] FDA and has been authorized for detection and/or diagnosis of SARS-CoV-2  by FDA under an Emergency Use Authorization (EUA).  This EUA will remain in effect (meaning this test can be used) for the duration of the COVID-19 declaration under Section 564(b)(1) of the Act, 21 U.S.C. section 360bbb-3(b)(1), unless the authorization is terminated or revoked sooner.  Performed at [facility], [address] [place], [place] [address]   MRSA Next Gen by PCR, Nasal     Status: Abnormal   Collection Time: 05/06/23  1:02 AM   Specimen: Nasal Mucosa; Nasal Swab  Result Value Ref Range Status   MRSA by PCR Next Gen DETECTED (A) NOT DETECTED Final    Comment: WILLIS, S. RN AT [phone] ON 05/06/2023 BY MECIAL J. RESULT CALLED TO, READ BACK BY AND VERIFIED WITH: (NOTE) The GeneXpert MRSA Assay (FDA approved for NASAL specimens only), is one component of a comprehensive MRSA colonization surveillance program. It is not intended to diagnose MRSA infection nor to guide or monitor treatment for MRSA infections. Test performance is not FDA approved in patients less than 75 years old. Performed at [facility], [address] [place], [place] [address]      Discharge Instructions:   Discharge Instructions     Diet Carb Modified   Complete by:  As directed    Increase activity slowly   Complete by: As directed       Allergies as of 05/07/2023       Reactions   Mirapex [pramipexole] Shortness Of Breath        Medication List     STOP taking these medications    Lancet Device Misc   Lancets Misc. Misc   pantoprazole 40 MG tablet Commonly known as: PROTONIX   phentermine 37.5 MG tablet Commonly known as: ADIPEX-P       TAKE these medications    B-D UF III MINI PEN NEEDLES 31G X 5 MM Misc Generic drug: Insulin Pen Needle To use with Insulin pens, as directed   Blood Glucose Monitoring Suppl Devi 1 each by Does not apply route in the morning, at noon, and at bedtime. May substitute based on patient's preference and/or insurance.   BLOOD GLUCOSE TEST STRIPS Strp 1 each by In Vitro route in the morning, at noon, and at bedtime. May substitute based on patient's preference and/or insurance.   cyanocobalamin 1000 MCG/ML injection Commonly known as: VITAMIN B12 Inject 1,000 mcg into the muscle See admin instructions. 1000 mcg injected IM every Tuesday.   Dexcom G6 Transmitter Misc Inject 1 each into the skin every 14 (fourteen) days.   DULoxetine 60 MG capsule Commonly known as: CYMBALTA Take 60 mg by mouth daily.   insulin aspart 100 UNIT/ML FlexPen Commonly known as: NOVOLOG Inject 0-18 Units into the skin 3 (three) times daily with meals. Check Blood Glucose (BG) and inject per scale: BG <150= 0 unit; BG 150-200= 3 unit; BG 201-250= 6 unit; BG 251-300= 9 unit; BG 301-350= 12 unit; BG 351-400= 15 unit; BG >400= 18 unit and Call Primary Care.   Insulin Syringes (Disposable) U-100 1 ML Misc Inject 1 each into the skin in the morning and at bedtime.   Levemir FlexTouch 100 UNIT/ML FlexTouch Pen Generic drug: insulin detemir Inject 15 Units into the skin 2 (two) times daily. What changed:  how much to take when to take this   metoCLOPramide 10 MG tablet Commonly known as: REGLAN 3 times daily with  meals for 3 days then PRN nausea What changed:  how much to take  how to take this when to take this additional instructions   oxyCODONE 15 MG immediate release tablet Commonly known as: ROXICODONE Take 15 mg by mouth every 6 (six) hours as needed for pain.          Time coordinating discharge: 45 min  Signed:  Joseph Art DO  [facility] 05/07/2023, 10:52 AM

## 2023-05-07 NOTE — Plan of Care (Signed)
Problem: Education: Goal: Ability to describe self-care measures that may prevent or decrease complications (Diabetes Survival Skills Education) will improve Outcome: Adequate for Discharge Goal: Individualized Educational Video(s) Outcome: Adequate for Discharge   Problem: Cardiac: Goal: Ability to maintain an adequate cardiac output will improve Outcome: Adequate for Discharge   Problem: Health Behavior/Discharge Planning: Goal: Ability to identify and utilize available resources and services will improve Outcome: Adequate for Discharge Goal: Ability to manage health-related needs will improve Outcome: Adequate for Discharge   Problem: Fluid Volume: Goal: Ability to achieve a balanced intake and output will improve Outcome: Adequate for Discharge   Problem: Metabolic: Goal: Ability to maintain appropriate glucose levels will improve Outcome: Adequate for Discharge   Problem: Nutritional: Goal: Maintenance of adequate nutrition will improve Outcome: Adequate for Discharge Goal: Maintenance of adequate weight for body size and type will improve Outcome: Adequate for Discharge   Problem: Respiratory: Goal: Will regain and/or maintain adequate ventilation Outcome: Adequate for Discharge   Problem: Urinary Elimination: Goal: Ability to achieve and maintain adequate renal perfusion and functioning will improve Outcome: Adequate for Discharge   Problem: Education: Goal: Knowledge of General Education information will improve Description: Including pain rating scale, medication(s)/side effects and non-pharmacologic comfort measures Outcome: Adequate for Discharge   Problem: Health Behavior/Discharge Planning: Goal: Ability to manage health-related needs will improve Outcome: Adequate for Discharge   Problem: Clinical Measurements: Goal: Ability to maintain clinical measurements within normal limits will improve Outcome: Adequate for Discharge Goal: Will remain free from  infection Outcome: Adequate for Discharge Goal: Diagnostic test results will improve Outcome: Adequate for Discharge Goal: Respiratory complications will improve Outcome: Adequate for Discharge Goal: Cardiovascular complication will be avoided Outcome: Adequate for Discharge   Problem: Activity: Goal: Risk for activity intolerance will decrease Outcome: Adequate for Discharge   Problem: Nutrition: Goal: Adequate nutrition will be maintained Outcome: Adequate for Discharge   Problem: Coping: Goal: Level of anxiety will decrease Outcome: Adequate for Discharge   Problem: Elimination: Goal: Will not experience complications related to bowel motility Outcome: Adequate for Discharge Goal: Will not experience complications related to urinary retention Outcome: Adequate for Discharge   Problem: Pain Managment: Goal: General experience of comfort will improve Outcome: Adequate for Discharge   Problem: Safety: Goal: Ability to remain free from injury will improve Outcome: Adequate for Discharge   Problem: Skin Integrity: Goal: Risk for impaired skin integrity will decrease Outcome: Adequate for Discharge   Problem: Education: Goal: Ability to describe self-care measures that may prevent or decrease complications (Diabetes Survival Skills Education) will improve Outcome: Adequate for Discharge Goal: Individualized Educational Video(s) Outcome: Adequate for Discharge   Problem: Coping: Goal: Ability to adjust to condition or change in health will improve Outcome: Adequate for Discharge   Problem: Fluid Volume: Goal: Ability to maintain a balanced intake and output will improve Outcome: Adequate for Discharge   Problem: Health Behavior/Discharge Planning: Goal: Ability to identify and utilize available resources and services will improve Outcome: Adequate for Discharge Goal: Ability to manage health-related needs will improve Outcome: Adequate for Discharge   Problem:  Metabolic: Goal: Ability to maintain appropriate glucose levels will improve Outcome: Adequate for Discharge   Problem: Nutritional: Goal: Maintenance of adequate nutrition will improve Outcome: Adequate for Discharge Goal: Progress toward achieving an optimal weight will improve Outcome: Adequate for Discharge   Problem: Skin Integrity: Goal: Risk for impaired skin integrity will decrease Outcome: Adequate for Discharge   Problem: Tissue Perfusion: Goal: Adequacy of tissue perfusion will improve  Outcome: Adequate for Discharge

## 2023-05-07 NOTE — Plan of Care (Signed)
Problem: Education: Goal: Ability to describe self-care measures that may prevent or decrease complications (Diabetes Survival Skills Education) will improve Outcome: Progressing Goal: Individualized Educational Video(s) Outcome: Progressing   Problem: Cardiac: Goal: Ability to maintain an adequate cardiac output will improve Outcome: Progressing   Problem: Health Behavior/Discharge Planning: Goal: Ability to identify and utilize available resources and services will improve Outcome: Progressing Goal: Ability to manage health-related needs will improve Outcome: Progressing   Problem: Fluid Volume: Goal: Ability to achieve a balanced intake and output will improve Outcome: Progressing   Problem: Metabolic: Goal: Ability to maintain appropriate glucose levels will improve Outcome: Progressing   Problem: Nutritional: Goal: Maintenance of adequate nutrition will improve Outcome: Progressing Goal: Maintenance of adequate weight for body size and type will improve Outcome: Progressing   Problem: Respiratory: Goal: Will regain and/or maintain adequate ventilation Outcome: Progressing   Problem: Urinary Elimination: Goal: Ability to achieve and maintain adequate renal perfusion and functioning will improve Outcome: Progressing   Problem: Education: Goal: Knowledge of General Education information will improve Description: Including pain rating scale, medication(s)/side effects and non-pharmacologic comfort measures Outcome: Progressing   Problem: Health Behavior/Discharge Planning: Goal: Ability to manage health-related needs will improve Outcome: Progressing   Problem: Clinical Measurements: Goal: Ability to maintain clinical measurements within normal limits will improve Outcome: Progressing Goal: Will remain free from infection Outcome: Progressing Goal: Diagnostic test results will improve Outcome: Progressing Goal: Respiratory complications will improve Outcome:  Progressing Goal: Cardiovascular complication will be avoided Outcome: Progressing   Problem: Activity: Goal: Risk for activity intolerance will decrease Outcome: Progressing   Problem: Nutrition: Goal: Adequate nutrition will be maintained Outcome: Progressing   Problem: Coping: Goal: Level of anxiety will decrease Outcome: Progressing   Problem: Elimination: Goal: Will not experience complications related to bowel motility Outcome: Progressing Goal: Will not experience complications related to urinary retention Outcome: Progressing   Problem: Pain Managment: Goal: General experience of comfort will improve Outcome: Progressing   Problem: Safety: Goal: Ability to remain free from injury will improve Outcome: Progressing   Problem: Skin Integrity: Goal: Risk for impaired skin integrity will decrease Outcome: Progressing   Problem: Education: Goal: Ability to describe self-care measures that may prevent or decrease complications (Diabetes Survival Skills Education) will improve Outcome: Progressing Goal: Individualized Educational Video(s) Outcome: Progressing   Problem: Coping: Goal: Ability to adjust to condition or change in health will improve Outcome: Progressing   Problem: Fluid Volume: Goal: Ability to maintain a balanced intake and output will improve Outcome: Progressing   Problem: Health Behavior/Discharge Planning: Goal: Ability to identify and utilize available resources and services will improve Outcome: Progressing Goal: Ability to manage health-related needs will improve Outcome: Progressing   Problem: Metabolic: Goal: Ability to maintain appropriate glucose levels will improve Outcome: Progressing   Problem: Nutritional: Goal: Maintenance of adequate nutrition will improve Outcome: Progressing Goal: Progress toward achieving an optimal weight will improve Outcome: Progressing   Problem: Skin Integrity: Goal: Risk for impaired skin  integrity will decrease Outcome: Progressing   Problem: Tissue Perfusion: Goal: Adequacy of tissue perfusion will improve Outcome: Progressing

## 2023-05-10 ENCOUNTER — Other Ambulatory Visit: Payer: Self-pay

## 2023-05-10 ENCOUNTER — Telehealth (HOSPITAL_BASED_OUTPATIENT_CLINIC_OR_DEPARTMENT_OTHER): Payer: Self-pay | Admitting: Emergency Medicine

## 2023-05-10 ENCOUNTER — Encounter (HOSPITAL_BASED_OUTPATIENT_CLINIC_OR_DEPARTMENT_OTHER): Payer: Self-pay

## 2023-05-10 ENCOUNTER — Emergency Department (HOSPITAL_BASED_OUTPATIENT_CLINIC_OR_DEPARTMENT_OTHER)
Admission: EM | Admit: 2023-05-10 | Discharge: 2023-05-10 | Disposition: A | Discharge status: Home / Self Care | Payer: BC Managed Care – PPO | Attending: Emergency Medicine | Admitting: Emergency Medicine

## 2023-05-10 DIAGNOSIS — R000 Tachycardia, unspecified: Secondary | ICD-10-CM | POA: Diagnosis not present

## 2023-05-10 DIAGNOSIS — K3184 Gastroparesis: Secondary | ICD-10-CM | POA: Diagnosis not present

## 2023-05-10 DIAGNOSIS — R112 Nausea with vomiting, unspecified: Secondary | ICD-10-CM | POA: Insufficient documentation

## 2023-05-10 DIAGNOSIS — Z1152 Encounter for screening for COVID-19: Secondary | ICD-10-CM | POA: Insufficient documentation

## 2023-05-10 DIAGNOSIS — R110 Nausea: Secondary | ICD-10-CM

## 2023-05-10 DIAGNOSIS — Z794 Long term (current) use of insulin: Secondary | ICD-10-CM | POA: Insufficient documentation

## 2023-05-10 DIAGNOSIS — E109 Type 1 diabetes mellitus without complications: Secondary | ICD-10-CM | POA: Insufficient documentation

## 2023-05-10 LAB — URINALYSIS, ROUTINE W REFLEX MICROSCOPIC
Bacteria, UA: NONE SEEN
Bilirubin Urine: NEGATIVE
Glucose, UA: >1000 mg/dL — AB
Hgb urine dipstick: NEGATIVE
Ketones, ur: 15 mg/dL — AB
Leukocytes,Ua: NEGATIVE
Nitrite: NEGATIVE
Protein, ur: NEGATIVE mg/dL
Specific Gravity, Urine: 1.02 (ref 1.005–1.030)
pH: 7.5 (ref 5.0–8.0)

## 2023-05-10 LAB — COMPREHENSIVE METABOLIC PANEL
ALT: 20 U/L (ref 0–44)
ALT: 15 U/L (ref 0–44)
AST: 26 U/L (ref 15–41)
AST: 13 U/L — ABNORMAL LOW (ref 15–41)
Albumin: 4.2 g/dL (ref 3.5–5.0)
Albumin: 4.1 g/dL (ref 3.5–5.0)
Alkaline Phosphatase: 93 U/L (ref 38–126)
Alkaline Phosphatase: 85 U/L (ref 38–126)
Anion gap: 9 (ref 5–15)
Anion gap: 11 (ref 5–15)
BUN: 11 mg/dL (ref 6–20)
BUN: 7 mg/dL (ref 6–20)
CO2: 29 mmol/L (ref 22–32)
CO2: 26 mmol/L (ref 22–32)
Calcium: 10.1 mg/dL (ref 8.9–10.3)
Calcium: 9.6 mg/dL (ref 8.9–10.3)
Chloride: 97 mmol/L — ABNORMAL LOW (ref 98–111)
Chloride: 94 mmol/L — ABNORMAL LOW (ref 98–111)
Creatinine, Ser: 0.71 mg/dL (ref 0.44–1.00)
Creatinine, Ser: 0.72 mg/dL (ref 0.44–1.00)
GFR, Estimated: >60 mL/min (ref >60)
GFR, Estimated: >60 mL/min (ref >60)
Glucose, Bld: 366 mg/dL — ABNORMAL HIGH (ref 70–99)
Glucose, Bld: 382 mg/dL — ABNORMAL HIGH (ref 70–99)
Potassium: 4.2 mmol/L (ref 3.5–5.1)
Potassium: 4 mmol/L (ref 3.5–5.1)
Sodium: 135 mmol/L (ref 135–145)
Sodium: 131 mmol/L — ABNORMAL LOW (ref 135–145)
Total Bilirubin: 0.4 mg/dL (ref 0.3–1.2)
Total Bilirubin: 0.4 mg/dL (ref 0.3–1.2)
Total Protein: 7.7 g/dL (ref 6.5–8.1)
Total Protein: 7.4 g/dL (ref 6.5–8.1)

## 2023-05-10 LAB — CBC WITH DIFFERENTIAL/PLATELET
Abs Immature Granulocytes: 0.02 10*3/uL (ref 0.00–0.07)
Abs Immature Granulocytes: 0.02 10*3/uL (ref 0.00–0.07)
Basophils Absolute: 0 10*3/uL (ref 0.0–0.1)
Basophils Absolute: 0 10*3/uL (ref 0.0–0.1)
Basophils Relative: 0 %
Basophils Relative: 0 %
Eosinophils Absolute: 0.1 10*3/uL (ref 0.0–0.5)
Eosinophils Absolute: 0 10*3/uL (ref 0.0–0.5)
Eosinophils Relative: 1 %
Eosinophils Relative: 0 %
HCT: 38 % (ref 36.0–46.0)
HCT: 36.1 % (ref 36.0–46.0)
Hemoglobin: 13 g/dL (ref 12.0–15.0)
Hemoglobin: 12.7 g/dL (ref 12.0–15.0)
Immature Granulocytes: 0 %
Immature Granulocytes: 0 %
Lymphocytes Relative: 25 %
Lymphocytes Relative: 22 %
Lymphs Abs: 2 10*3/uL (ref 0.7–4.0)
Lymphs Abs: 1.9 10*3/uL (ref 0.7–4.0)
MCH: 30 pg (ref 26.0–34.0)
MCH: 30.8 pg (ref 26.0–34.0)
MCHC: 34.2 g/dL (ref 30.0–36.0)
MCHC: 35.2 g/dL (ref 30.0–36.0)
MCV: 87.8 fL (ref 80.0–100.0)
MCV: 87.4 fL (ref 80.0–100.0)
Monocytes Absolute: 0.4 10*3/uL (ref 0.1–1.0)
Monocytes Absolute: 0.4 10*3/uL (ref 0.1–1.0)
Monocytes Relative: 5 %
Monocytes Relative: 5 %
Neutro Abs: 5.5 10*3/uL (ref 1.7–7.7)
Neutro Abs: 6.4 10*3/uL (ref 1.7–7.7)
Neutrophils Relative %: 69 %
Neutrophils Relative %: 73 %
Platelets: 367 10*3/uL (ref 150–400)
Platelets: 357 10*3/uL (ref 150–400)
RBC: 4.33 MIL/uL (ref 3.87–5.11)
RBC: 4.13 MIL/uL (ref 3.87–5.11)
RDW: 12.9 % (ref 11.5–15.5)
RDW: 12.9 % (ref 11.5–15.5)
WBC: 8.1 10*3/uL (ref 4.0–10.5)
WBC: 8.8 10*3/uL (ref 4.0–10.5)
nRBC: 0 % (ref 0.0–0.2)
nRBC: 0 % (ref 0.0–0.2)

## 2023-05-10 LAB — I-STAT VENOUS BLOOD GAS, ED
Acid-Base Excess: 5 mmol/L — ABNORMAL HIGH (ref 0.0–2.0)
Acid-Base Excess: 5 mmol/L — ABNORMAL HIGH (ref 0.0–2.0)
Bicarbonate: 30.9 mmol/L — ABNORMAL HIGH (ref 20.0–28.0)
Bicarbonate: 31.4 mmol/L — ABNORMAL HIGH (ref 20.0–28.0)
Calcium, Ion: 1.24 mmol/L (ref 1.15–1.40)
Calcium, Ion: 1.2 mmol/L (ref 1.15–1.40)
HCT: 38 % (ref 36.0–46.0)
HCT: 41 % (ref 36.0–46.0)
Hemoglobin: 12.9 g/dL (ref 12.0–15.0)
Hemoglobin: 13.9 g/dL (ref 12.0–15.0)
O2 Saturation: 69 %
O2 Saturation: 48 %
Patient temperature: 98.2
Patient temperature: 98.6
Potassium: 4.1 mmol/L (ref 3.5–5.1)
Potassium: 4.5 mmol/L (ref 3.5–5.1)
Sodium: 136 mmol/L (ref 135–145)
Sodium: 133 mmol/L — ABNORMAL LOW (ref 135–145)
TCO2: 32 mmol/L (ref 22–32)
TCO2: 33 mmol/L — ABNORMAL HIGH (ref 22–32)
pCO2, Ven: 50 mmHg (ref 44–60)
pCO2, Ven: 50.8 mmHg (ref 44–60)
pH, Ven: 7.398 (ref 7.25–7.43)
pH, Ven: 7.399 (ref 7.25–7.43)
pO2, Ven: 36 mmHg (ref 32–45)
pO2, Ven: 27 mmHg — CL (ref 32–45)

## 2023-05-10 LAB — CBG MONITORING, ED
Glucose-Capillary: 348 mg/dL — ABNORMAL HIGH (ref 70–99)
Glucose-Capillary: 366 mg/dL — ABNORMAL HIGH (ref 70–99)

## 2023-05-10 LAB — SARS CORONAVIRUS 2 BY RT PCR: SARS Coronavirus 2 by RT PCR: NEGATIVE

## 2023-05-10 LAB — RAPID URINE DRUG SCREEN, HOSP PERFORMED
Amphetamines: NOT DETECTED
Barbiturates: NOT DETECTED
Benzodiazepines: NOT DETECTED
Cocaine: NOT DETECTED
Opiates: NOT DETECTED
Tetrahydrocannabinol: POSITIVE — AB

## 2023-05-10 LAB — BETA-HYDROXYBUTYRIC ACID: Beta-Hydroxybutyric Acid: 0.84 mmol/L — ABNORMAL HIGH (ref 0.05–0.27)

## 2023-05-10 LAB — LIPASE, BLOOD: Lipase: 15 U/L (ref 11–51)

## 2023-05-10 LAB — MAGNESIUM: Magnesium: 1.7 mg/dL (ref 1.7–2.4)

## 2023-05-10 LAB — PREGNANCY, URINE: Preg Test, Ur: NEGATIVE

## 2023-05-10 MED ORDER — SODIUM CHLORIDE 0.9 % IV BOLUS
1000.0000 mL | Freq: Once | INTRAVENOUS | Status: AC
  Administered 2023-05-10: 1000 mL via INTRAVENOUS

## 2023-05-10 MED ORDER — DIPHENHYDRAMINE HCL 50 MG/ML IJ SOLN
12.5000 mg | Freq: Once | INTRAMUSCULAR | Status: AC
  Administered 2023-05-10: 12.5 mg via INTRAVENOUS
  Filled 2023-05-10: qty 1

## 2023-05-10 MED ORDER — ONDANSETRON HCL 4 MG/2ML IJ SOLN
4.0000 mg | Freq: Once | INTRAMUSCULAR | Status: AC
  Administered 2023-05-10: 4 mg via INTRAVENOUS
  Filled 2023-05-10: qty 2

## 2023-05-10 MED ORDER — INSULIN ASPART 100 UNIT/ML IJ SOLN: 5.0000 [IU] | Freq: Once | INTRAMUSCULAR | Status: DC

## 2023-05-10 MED ORDER — HYDROMORPHONE HCL 1 MG/ML IJ SOLN
1.0000 mg | Freq: Once | INTRAMUSCULAR | Status: AC
  Administered 2023-05-10: 1 mg via INTRAVENOUS
  Filled 2023-05-10: qty 1

## 2023-05-10 MED ORDER — METOCLOPRAMIDE HCL 5 MG/ML IJ SOLN
10.0000 mg | Freq: Once | INTRAMUSCULAR | Status: AC
  Administered 2023-05-10: 10 mg via INTRAVENOUS
  Filled 2023-05-10: qty 2

## 2023-05-10 MED ORDER — LACTATED RINGERS IV BOLUS
1000.0000 mL | Freq: Once | INTRAVENOUS | Status: AC
  Administered 2023-05-10: 1000 mL via INTRAVENOUS

## 2023-05-10 MED ORDER — FENTANYL CITRATE PF 50 MCG/ML IJ SOSY
25.0000 ug | PREFILLED_SYRINGE | Freq: Once | INTRAMUSCULAR | Status: AC
  Administered 2023-05-10: 25 ug via INTRAVENOUS
  Filled 2023-05-10: qty 1

## 2023-05-10 MED ORDER — METOCLOPRAMIDE HCL 10 MG PO TABS: 10.0000 mg | ORAL_TABLET | Freq: Four times a day (QID) | ORAL | 0 refills | Status: DC

## 2023-05-10 MED ORDER — HALOPERIDOL LACTATE 5 MG/ML IJ SOLN
5.0000 mg | Freq: Once | INTRAMUSCULAR | Status: AC
  Administered 2023-05-10: 5 mg via INTRAVENOUS
  Filled 2023-05-10: qty 1

## 2023-05-10 NOTE — ED Triage Notes (Signed)
"  Woke up this morning vomiting, can't keep anything down, blood sugar was 220. History of DKA" per pt

## 2023-05-10 NOTE — Discharge Instructions (Signed)
Take your nighttime insulin when you get home.  Follow-up with your primary care doctor.

## 2023-05-10 NOTE — ED Provider Notes (Addendum)
[facility] EMERGENCY DEPARTMENT AT [facility] Provider Note   CSN: [id number] Arrival date & time: 05/10/23  1932     History  Chief Complaint  Patient presents with   Nausea    [name] is a 37 y.o. female.  Patient returns to the ED with ongoing nausea and vomiting.  Seen here earlier for the same.  Has not felt better.  Has been able to tolerate p.o.  Took insulin couple hours ago and blood sugar was still high.  She denies any abdominal pain.  She is on chronic narcotic pain meds.  She has no chest pain shortness of breath.  No fever or chills.  She states only Dilaudid helps.  The history is provided by the patient.       Home Medications Prior to Admission medications   Medication Sig Start Date End Date Taking? Authorizing Provider  Blood Glucose Monitoring Suppl DEVI 1 each by Does not apply route in the morning, at noon, and at bedtime. May substitute based on patient's preference and/or insurance. 04/06/23   Almon Hercules, MD  Continuous Blood Gluc Transmit (DEXCOM G6 TRANSMITTER) MISC Inject 1 each into the skin every 14 (fourteen) days. 12/31/21   Dulce Sellar, NP  cyanocobalamin (VITAMIN B12) 1000 MCG/ML injection Inject 1,000 mcg into the muscle See admin instructions. 1000 mcg injected IM every Tuesday. 08/31/22   [provider]  DULoxetine (CYMBALTA) 60 MG capsule Take 60 mg by mouth daily. 06/28/22   [provider]  insulin aspart (NOVOLOG) 100 UNIT/ML FlexPen Inject 0-18 Units into the skin 3 (three) times daily with meals. Check Blood Glucose (BG) and inject per scale: BG <150= 0 unit; BG 150-200= 3 unit; BG 201-250= 6 unit; BG 251-300= 9 unit; BG 301-350= 12 unit; BG 351-400= 15 unit; BG >400= 18 unit and Call Primary Care. 02/04/23 05/06/23  Dahal, Melina Schools, MD  insulin detemir (LEVEMIR FLEXTOUCH) 100 UNIT/ML FlexPen Inject 15 Units into the skin 2 (two) times daily. Patient taking differently: Inject 60 Units into the  skin at bedtime. 02/04/23   Lorin Glass, MD  Insulin Pen Needle (B-D UF III MINI PEN NEEDLES) 31G X 5 MM MISC To use with Insulin pens, as directed 11/20/21   Dulce Sellar, NP  Insulin Syringes, Disposable, U-100 1 ML MISC Inject 1 each into the skin in the morning and at bedtime. 08/10/21   Dulce Sellar, NP  metoCLOPramide (REGLAN) 10 MG tablet Take 1 tablet (10 mg total) by mouth every 6 (six) hours. 05/10/23   Alvira Monday, MD  oxyCODONE (ROXICODONE) 15 MG immediate release tablet Take 15 mg by mouth every 6 (six) hours as needed for pain.    [provider]      Allergies    Mirapex [pramipexole]    Review of Systems   Review of Systems  Physical Exam Updated Vital Signs BP (!) 150/91   Pulse (!) 103   Temp 98 F (36.7 C) (Oral)   Resp 12   LMP 05/03/2023 (Exact Date)   SpO2 96%  Physical Exam Vitals and nursing note reviewed.  Constitutional:      General: She is not in acute distress.    Appearance: She is well-developed. She is not ill-appearing.  HENT:     Head: Normocephalic and atraumatic.     Mouth/Throat:     Mouth: Mucous membranes are moist.  Eyes:     Extraocular Movements: Extraocular movements intact.     Conjunctiva/sclera: Conjunctivae normal.  Pupils: Pupils are equal, round, and reactive to light.  Cardiovascular:     Rate and Rhythm: Normal rate and regular rhythm.     Pulses: Normal pulses.     Heart sounds: Normal heart sounds. No murmur heard. Pulmonary:     Effort: Pulmonary effort is normal. No respiratory distress.     Breath sounds: Normal breath sounds.  Abdominal:     Palpations: Abdomen is soft.     Tenderness: There is no abdominal tenderness.  Musculoskeletal:        General: No swelling.     Cervical back: Normal range of motion and neck supple.  Skin:    General: Skin is warm and dry.     Capillary Refill: Capillary refill takes less than 2 seconds.  Neurological:     General: No focal deficit present.      Mental Status: She is alert.  Psychiatric:        Mood and Affect: Mood normal.     ED Results / Procedures / Treatments   Labs (all labs ordered are listed, but only abnormal results are displayed) Labs Reviewed  COMPREHENSIVE METABOLIC PANEL - Abnormal; Notable for the following components:      Result Value   Sodium 131 (*)    Chloride 94 (*)    Glucose, Bld 382 (*)    AST 13 (*)    All other components within normal limits  CBG MONITORING, ED - Abnormal; Notable for the following components:   Glucose-Capillary 366 (*)    All other components within normal limits  I-STAT VENOUS BLOOD GAS, ED - Abnormal; Notable for the following components:   pO2, Ven 27 (*)    Bicarbonate 31.4 (*)    TCO2 33 (*)    Acid-Base Excess 5.0 (*)    Sodium 133 (*)    All other components within normal limits  CBC WITH DIFFERENTIAL/PLATELET  BLOOD GAS, VENOUS    EKG None  Radiology No results found.  Procedures Procedures    Medications Ordered in ED Medications  sodium chloride 0.9 % bolus 1,000 mL (1,000 mLs Intravenous New Bag/Given 05/10/23 2205)  HYDROmorphone (DILAUDID) injection 1 mg (1 mg Intravenous Given 05/10/23 2205)    ED Course/ Medical Decision Making/ A&P                                 Medical Decision Making Amount and/or Complexity of Data Reviewed Labs: ordered.  Risk Prescription drug management.   [name] is here with nausea and vomiting.  History of diabetes, marijuana use, chronic pain.  Differential diagnosis includes gastroparesis versus marijuana hyperemesis versus chronic pain syndrome.  Overall she has no abdominal tenderness on exam.  She had lab work today was unremarkable.  She was not in DKA earlier in the day.  Blood sugars to be 66 upon arrival here.  Vitals are unremarkable.  Overall she is on chronic narcotics.  She takes 15 mg of oxycodone several times a day.  Will reevaluate labs to see if she is in DKA and treat for  gastroparesis/hyperemesis from marijuana.  Will give IV fluids, IV Dilaudid and antiemetic and reevaluate.  Per my review and interpretation of labs patient is out of DKA.  Blood sugar 380.  She just took 10 units of her short acting insulin 6 hours ago.  She has been given a fluid bolus.  Overall she is feeling better.  She did not want to be admitted which I thought was probably reasonable given multiple visits today.  However she wants to try ongoing management outpatient at home.  She has Reglan prescription.  Given 5 NovoLog she will take short acting insulin in the morning as well.  She will take her nighttime long-acting insulin when she gets home.Marland Kitchen  Discharged in good condition.  Overall I do suspect her symptoms are from hyperemesis from marijuana or gastroparesis.  Recommend follow-up with primary care doctor.  This chart was dictated using voice recognition software.  Despite best efforts to proofread,  errors can occur which can change the documentation meaning.     Final Clinical Impression(s) / ED Diagnoses Final diagnoses:  Nausea    Rx / DC Orders ED Discharge Orders     None         Virgina Norfolk, DO 05/10/23 2311    Virgina Norfolk, DO 05/10/23 2313    Virgina Norfolk, DO 05/10/23 2317

## 2023-05-10 NOTE — ED Triage Notes (Signed)
Pt to triage c/o N/V related to Diabetic complications. Pt states she was DC earlier from ER for same. At this time CBG 366 PT states she admin 10 units of Reg Insulin @ 17:30. VSS NAD PT on room air.

## 2023-05-10 NOTE — Telephone Encounter (Signed)
GI referral placed

## 2023-05-10 NOTE — ED Provider Notes (Signed)
[place] EMERGENCY DEPARTMENT AT [facility] Provider Note   CSN: [id number] Arrival date & time: 05/10/23  4132     History  Chief Complaint  Patient presents with   Emesis    Soniya L Americus Herson is a 37 y.o. female.  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  Yesterday was ok Today woke up with nausea, vomiting, feeling pain all over the body like burning Pain everywhere, has neuropathy No fever No urinary symptoms, dysuria, urinary frequency No diarrhea or constipation Vomiting over 5 times this AM, no blood No vaginal bleeding or discharge No ibuprofen, etoh, marijuana or other drugs No cigarettes No change in medicines Taking insulin Denies possibility of pregnancy.  Past Medical History:  Diagnosis Date   Chronic constipation 11/08/2021   Diabetic neuropathy (HCC)    History of chicken pox    History of noncompliance with medical treatment 09/07/2022   Type 1 diabetes (HCC)    Vaginal odor 11/08/2021     Home Medications Prior to Admission medications   Medication Sig Start Date End Date Taking? Authorizing Provider  metoCLOPramide (REGLAN) 10 MG tablet Take 1 tablet (10 mg total) by mouth every 6 (six) hours. 05/10/23  Yes Alvira Monday, MD  Blood Glucose Monitoring Suppl DEVI 1 each by Does not apply route in the morning, at noon, and at bedtime. May substitute based on patient's preference and/or insurance. 04/06/23   Almon Hercules, MD  Continuous Blood Gluc Transmit (DEXCOM G6 TRANSMITTER) MISC Inject 1 each into the skin every 14 (fourteen) days. 12/31/21   Dulce Sellar, NP  cyanocobalamin (VITAMIN B12) 1000 MCG/ML injection Inject 1,000 mcg into the muscle See admin  instructions. 1000 mcg injected IM every Tuesday. 08/31/22   [provider]  DULoxetine (CYMBALTA) 60 MG capsule Take 60 mg by mouth daily. 06/28/22   [provider]  insulin aspart (NOVOLOG) 100 UNIT/ML FlexPen Inject 0-18 Units into the skin 3 (three) times daily with meals. Check Blood Glucose (BG) and inject per scale: BG <150= 0 unit; BG 150-200= 3 unit; BG 201-250= 6 unit; BG 251-300= 9 unit; BG 301-350= 12 unit; BG 351-400= 15 unit; BG >400= 18 unit and Call Primary Care. 02/04/23 05/06/23  Dahal, Melina Schools, MD  insulin detemir (LEVEMIR FLEXTOUCH) 100 UNIT/ML FlexPen Inject 15 Units into the skin 2 (two) times daily. Patient taking differently: Inject 60 Units into the skin at bedtime. 02/04/23   Lorin Glass, MD  Insulin Pen Needle (B-D UF III MINI PEN NEEDLES) 31G X 5 MM MISC To use with Insulin pens, as directed 11/20/21   Dulce Sellar, NP  Insulin Syringes, Disposable, U-100 1 ML MISC Inject 1 each into the skin in the morning and at bedtime. 08/10/21   Dulce Sellar, NP  oxyCODONE (ROXICODONE) 15 MG immediate release tablet Take 15 mg by mouth every 6 (six) hours as needed for pain.    [provider]      Allergies    Mirapex [pramipexole]    Review of Systems   Review of Systems  Physical Exam Updated Vital Signs BP (!) 152/97   Pulse (!) 101   Temp 98.8 F (37.1 C) (Oral)   Resp 16   Ht 5\' 6"  (1.676 m)  Wt 79.4 kg   LMP 05/03/2023 (Exact Date)   SpO2 100%   BMI 28.25 kg/m  Physical Exam Vitals and nursing note reviewed.  Constitutional:      General: She is not in acute distress.    Appearance: She is well-developed. She is ill-appearing (nauseas). She is not diaphoretic.  HENT:     Head: Normocephalic and atraumatic.  Eyes:     Conjunctiva/sclera: Conjunctivae normal.  Cardiovascular:     Rate and Rhythm: Regular rhythm. Tachycardia present.     Heart sounds: Normal heart sounds. No murmur heard.    No friction rub. No gallop.   Pulmonary:     Effort: Pulmonary effort is normal. No respiratory distress.     Breath sounds: Normal breath sounds. No wheezing or rales.  Abdominal:     General: There is no distension.     Palpations: Abdomen is soft.     Tenderness: There is no abdominal tenderness. There is no guarding.  Musculoskeletal:        General: No tenderness.     Cervical back: Normal range of motion.  Skin:    General: Skin is warm and dry.     Findings: No erythema or rash.  Neurological:     Mental Status: She is alert and oriented to person, place, and time.     ED Results / Procedures / Treatments   Labs (all labs ordered are listed, but only abnormal results are displayed) Labs Reviewed  COMPREHENSIVE METABOLIC PANEL - Abnormal; Notable for the following components:      Result Value   Chloride 97 (*)    Glucose, Bld 366 (*)    All other components within normal limits  BETA-HYDROXYBUTYRIC ACID - Abnormal; Notable for the following components:   Beta-Hydroxybutyric Acid 0.84 (*)    All other components within normal limits  URINALYSIS, ROUTINE W REFLEX MICROSCOPIC - Abnormal; Notable for the following components:   Color, Urine COLORLESS (*)    Glucose, UA >1,000 (*)    Ketones, ur 15 (*)    All other components within normal limits  RAPID URINE DRUG SCREEN, HOSP PERFORMED - Abnormal; Notable for the following components:   Tetrahydrocannabinol POSITIVE (*)    All other components within normal limits  CBG MONITORING, ED - Abnormal; Notable for the following components:   Glucose-Capillary 348 (*)    All other components within normal limits  I-STAT VENOUS BLOOD GAS, ED - Abnormal; Notable for the following components:   Bicarbonate 30.9 (*)    Acid-Base Excess 5.0 (*)    All other components within normal limits  SARS CORONAVIRUS 2 BY RT PCR  CBC WITH DIFFERENTIAL/PLATELET  PREGNANCY, URINE  LIPASE, BLOOD  MAGNESIUM    EKG EKG Interpretation Date/Time:  Saturday May 10 2023 09:40:23 EDT Ventricular Rate:  103 PR Interval:  157 QRS Duration:  72 QT Interval:  342 QTC Calculation: 448 R Axis:   70  Text Interpretation: Sinus tachycardia Probable left atrial enlargement No significant change since last tracing Reconfirmed by Alvira Monday ([id number]) on 05/10/2023 9:45:17 AM  Radiology No results found.  Procedures Procedures    Medications Ordered in ED Medications  lactated ringers bolus 1,000 mL (0 mLs Intravenous Stopped 05/10/23 1007)  metoCLOPramide (REGLAN) injection 10 mg (10 mg Intravenous Given 05/10/23 0911)  diphenhydrAMINE (BENADRYL) injection 12.5 mg (12.5 mg Intravenous Given 05/10/23 0910)  fentaNYL (SUBLIMAZE) injection 25 mcg (25 mcg Intravenous Given 05/10/23 1004)  haloperidol lactate (HALDOL) injection 5 mg (  5 mg Intravenous Given 05/10/23 1005)  lactated ringers bolus 1,000 mL (0 mLs Intravenous Stopped 05/10/23 1111)  HYDROmorphone (DILAUDID) injection 1 mg (1 mg Intravenous Given 05/10/23 1246)  ondansetron (ZOFRAN) injection 4 mg (4 mg Intravenous Given 05/10/23 1246)    ED Course/ Medical Decision Making/ A&P                                  37 year old female with a 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  Her abdominal exam is benign, no distention, low clinical suspicion for small bowel obstruction, acute intra-abdominal infection or surgical emergency.  Differential diagnosis continues to include DKA, HHS, cannabinoid hyperemesis syndrome, gastroparesis, other cyclic vomiting syndrome, electrolyte abnormalities.  She also reports diffuse body pain that she attributes to neuropathy--ordered COVID testing to evaluate for other etiologies of body aches.  Labs completed and personally evaluated  interpreted by me show hyperglycemia without signs of DKA or clinically significant electrolyte abnormalities.  pH is normal, beta hydroxybutyric acid is not consistent with DKA.  CBC shows normal hemoglobin and white blood cell count.  Urinalysis shows glucose, small ketones, no infection. Pregnancy test negative.  Suspect symptoms are secondary to her underlying cyclic vomiting syndrome.  Initially given .5mg  dilaudid, reglan, benadryl and fluids. Continued symptoms and given haldol and fentanyl. Given another dose of pain medication and zofran and now reports improvement.  She is tolerating po.  Recommend follow up with PCP and will refer to GI.  Given rx for reglan. Patient discharged in stable condition with understanding of reasons to return.          Final Clinical Impression(s) / ED Diagnoses Final diagnoses:  Gastroparesis  Nausea and vomiting, unspecified vomiting type    Rx / DC Orders ED Discharge Orders          Ordered    metoCLOPramide (REGLAN) 10 MG tablet  Every 6 hours        05/10/23 1457              Alvira Monday, MD 05/10/23 2153

## 2023-05-12 ENCOUNTER — Other Ambulatory Visit (HOSPITAL_COMMUNITY): Payer: Self-pay

## 2023-05-13 ENCOUNTER — Other Ambulatory Visit: Payer: Self-pay

## 2023-05-13 ENCOUNTER — Emergency Department (HOSPITAL_BASED_OUTPATIENT_CLINIC_OR_DEPARTMENT_OTHER)
Admission: EM | Admit: 2023-05-13 | Discharge: 2023-05-13 | Disposition: A | Discharge status: Home / Self Care | Payer: BC Managed Care – PPO | Source: Home / Self Care | Attending: Emergency Medicine | Admitting: Emergency Medicine

## 2023-05-13 ENCOUNTER — Encounter (HOSPITAL_BASED_OUTPATIENT_CLINIC_OR_DEPARTMENT_OTHER): Payer: Self-pay

## 2023-05-13 ENCOUNTER — Emergency Department (HOSPITAL_BASED_OUTPATIENT_CLINIC_OR_DEPARTMENT_OTHER)
Admission: EM | Admit: 2023-05-13 | Discharge: 2023-05-13 | Disposition: A | Discharge status: Home / Self Care | Payer: BC Managed Care – PPO

## 2023-05-13 DIAGNOSIS — F1210 Cannabis abuse, uncomplicated: Secondary | ICD-10-CM | POA: Diagnosis not present

## 2023-05-13 DIAGNOSIS — R1084 Generalized abdominal pain: Secondary | ICD-10-CM | POA: Diagnosis present

## 2023-05-13 DIAGNOSIS — E1165 Type 2 diabetes mellitus with hyperglycemia: Secondary | ICD-10-CM | POA: Insufficient documentation

## 2023-05-13 DIAGNOSIS — Z794 Long term (current) use of insulin: Secondary | ICD-10-CM | POA: Insufficient documentation

## 2023-05-13 DIAGNOSIS — R1013 Epigastric pain: Secondary | ICD-10-CM | POA: Insufficient documentation

## 2023-05-13 DIAGNOSIS — R112 Nausea with vomiting, unspecified: Secondary | ICD-10-CM | POA: Insufficient documentation

## 2023-05-13 DIAGNOSIS — R000 Tachycardia, unspecified: Secondary | ICD-10-CM | POA: Insufficient documentation

## 2023-05-13 DIAGNOSIS — R739 Hyperglycemia, unspecified: Secondary | ICD-10-CM

## 2023-05-13 DIAGNOSIS — E1065 Type 1 diabetes mellitus with hyperglycemia: Secondary | ICD-10-CM | POA: Insufficient documentation

## 2023-05-13 LAB — COMPREHENSIVE METABOLIC PANEL
ALT: 17 U/L (ref 0–44)
ALT: 13 U/L (ref 0–44)
AST: 20 U/L (ref 15–41)
AST: 13 U/L — ABNORMAL LOW (ref 15–41)
Albumin: 4.3 g/dL (ref 3.5–5.0)
Albumin: 3.9 g/dL (ref 3.5–5.0)
Alkaline Phosphatase: 78 U/L (ref 38–126)
Alkaline Phosphatase: 68 U/L (ref 38–126)
Anion gap: 10 (ref 5–15)
Anion gap: 11 (ref 5–15)
BUN: 12 mg/dL (ref 6–20)
BUN: 9 mg/dL (ref 6–20)
CO2: 29 mmol/L (ref 22–32)
CO2: 24 mmol/L (ref 22–32)
Calcium: 9.7 mg/dL (ref 8.9–10.3)
Calcium: 8.7 mg/dL — ABNORMAL LOW (ref 8.9–10.3)
Chloride: 101 mmol/L (ref 98–111)
Chloride: 100 mmol/L (ref 98–111)
Creatinine, Ser: 0.81 mg/dL (ref 0.44–1.00)
Creatinine, Ser: 0.68 mg/dL (ref 0.44–1.00)
GFR, Estimated: >60 mL/min (ref >60)
GFR, Estimated: >60 mL/min (ref >60)
Glucose, Bld: 149 mg/dL — ABNORMAL HIGH (ref 70–99)
Glucose, Bld: 271 mg/dL — ABNORMAL HIGH (ref 70–99)
Potassium: 3.4 mmol/L — ABNORMAL LOW (ref 3.5–5.1)
Potassium: 3.9 mmol/L (ref 3.5–5.1)
Sodium: 140 mmol/L (ref 135–145)
Sodium: 135 mmol/L (ref 135–145)
Total Bilirubin: 0.3 mg/dL (ref 0.3–1.2)
Total Bilirubin: 0.3 mg/dL (ref 0.3–1.2)
Total Protein: 7.6 g/dL (ref 6.5–8.1)
Total Protein: 6.9 g/dL (ref 6.5–8.1)

## 2023-05-13 LAB — CBC WITH DIFFERENTIAL/PLATELET
Abs Immature Granulocytes: 0.04 10*3/uL (ref 0.00–0.07)
Abs Immature Granulocytes: 0.04 10*3/uL (ref 0.00–0.07)
Basophils Absolute: 0.1 10*3/uL (ref 0.0–0.1)
Basophils Absolute: 0 10*3/uL (ref 0.0–0.1)
Basophils Relative: 1 %
Basophils Relative: 0 %
Eosinophils Absolute: 0.1 10*3/uL (ref 0.0–0.5)
Eosinophils Absolute: 0 10*3/uL (ref 0.0–0.5)
Eosinophils Relative: 1 %
Eosinophils Relative: 0 %
HCT: 36.4 % (ref 36.0–46.0)
HCT: 34.9 % — ABNORMAL LOW (ref 36.0–46.0)
Hemoglobin: 12.6 g/dL (ref 12.0–15.0)
Hemoglobin: 11.9 g/dL — ABNORMAL LOW (ref 12.0–15.0)
Immature Granulocytes: 0 %
Immature Granulocytes: 0 %
Lymphocytes Relative: 26 %
Lymphocytes Relative: 20 %
Lymphs Abs: 2.4 10*3/uL (ref 0.7–4.0)
Lymphs Abs: 1.9 10*3/uL (ref 0.7–4.0)
MCH: 31 pg (ref 26.0–34.0)
MCH: 30.3 pg (ref 26.0–34.0)
MCHC: 34.6 g/dL (ref 30.0–36.0)
MCHC: 34.1 g/dL (ref 30.0–36.0)
MCV: 89.4 fL (ref 80.0–100.0)
MCV: 88.8 fL (ref 80.0–100.0)
Monocytes Absolute: 0.7 10*3/uL (ref 0.1–1.0)
Monocytes Absolute: 0.5 10*3/uL (ref 0.1–1.0)
Monocytes Relative: 7 %
Monocytes Relative: 5 %
Neutro Abs: 6.1 10*3/uL (ref 1.7–7.7)
Neutro Abs: 7 10*3/uL (ref 1.7–7.7)
Neutrophils Relative %: 65 %
Neutrophils Relative %: 75 %
Platelets: 379 10*3/uL (ref 150–400)
Platelets: 343 10*3/uL (ref 150–400)
RBC: 4.07 MIL/uL (ref 3.87–5.11)
RBC: 3.93 MIL/uL (ref 3.87–5.11)
RDW: 13.5 % (ref 11.5–15.5)
RDW: 13.2 % (ref 11.5–15.5)
WBC: 9.3 10*3/uL (ref 4.0–10.5)
WBC: 9.4 10*3/uL (ref 4.0–10.5)
nRBC: 0 % (ref 0.0–0.2)
nRBC: 0 % (ref 0.0–0.2)

## 2023-05-13 LAB — URINALYSIS, ROUTINE W REFLEX MICROSCOPIC
Bilirubin Urine: NEGATIVE
Glucose, UA: NEGATIVE mg/dL
Hgb urine dipstick: NEGATIVE
Ketones, ur: NEGATIVE mg/dL
Leukocytes,Ua: NEGATIVE
Nitrite: NEGATIVE
Protein, ur: NEGATIVE mg/dL
Specific Gravity, Urine: 1.019 (ref 1.005–1.030)
pH: 8 (ref 5.0–8.0)

## 2023-05-13 LAB — I-STAT VENOUS BLOOD GAS, ED
Acid-Base Excess: 1 mmol/L (ref 0.0–2.0)
Bicarbonate: 27.1 mmol/L (ref 20.0–28.0)
Calcium, Ion: 1.17 mmol/L (ref 1.15–1.40)
HCT: 39 % (ref 36.0–46.0)
Hemoglobin: 13.3 g/dL (ref 12.0–15.0)
O2 Saturation: 54 %
Potassium: 3.9 mmol/L (ref 3.5–5.1)
Sodium: 135 mmol/L (ref 135–145)
TCO2: 29 mmol/L (ref 22–32)
pCO2, Ven: 50.4 mmHg (ref 44–60)
pH, Ven: 7.339 (ref 7.25–7.43)
pO2, Ven: 31 mmHg — CL (ref 32–45)

## 2023-05-13 LAB — LIPASE, BLOOD: Lipase: 26 U/L (ref 11–51)

## 2023-05-13 LAB — CBG MONITORING, ED
Glucose-Capillary: 146 mg/dL — ABNORMAL HIGH (ref 70–99)
Glucose-Capillary: 261 mg/dL — ABNORMAL HIGH (ref 70–99)
Glucose-Capillary: 205 mg/dL — ABNORMAL HIGH (ref 70–99)

## 2023-05-13 LAB — RAPID URINE DRUG SCREEN, HOSP PERFORMED
Amphetamines: NOT DETECTED
Barbiturates: NOT DETECTED
Benzodiazepines: NOT DETECTED
Cocaine: NOT DETECTED
Opiates: NOT DETECTED
Tetrahydrocannabinol: POSITIVE — AB

## 2023-05-13 MED ORDER — METOCLOPRAMIDE HCL 5 MG/ML IJ SOLN
10.0000 mg | Freq: Once | INTRAMUSCULAR | Status: AC
  Administered 2023-05-13: 10 mg via INTRAVENOUS
  Filled 2023-05-13: qty 2

## 2023-05-13 MED ORDER — LORAZEPAM 2 MG/ML IJ SOLN
1.0000 mg | Freq: Once | INTRAMUSCULAR | Status: AC
  Administered 2023-05-13: 1 mg via INTRAVENOUS
  Filled 2023-05-13: qty 1

## 2023-05-13 MED ORDER — HYDROMORPHONE HCL 1 MG/ML IJ SOLN
1.0000 mg | Freq: Once | INTRAMUSCULAR | Status: AC
  Administered 2023-05-13: 1 mg via INTRAVENOUS
  Filled 2023-05-13: qty 1

## 2023-05-13 MED ORDER — MORPHINE SULFATE (PF) 4 MG/ML IV SOLN
4.0000 mg | Freq: Once | INTRAVENOUS | Status: AC
  Administered 2023-05-13: 4 mg via INTRAVENOUS
  Filled 2023-05-13: qty 1

## 2023-05-13 MED ORDER — SODIUM CHLORIDE 0.9 % IV BOLUS
1000.0000 mL | Freq: Once | INTRAVENOUS | Status: AC
  Administered 2023-05-13: 1000 mL via INTRAVENOUS

## 2023-05-13 MED ORDER — DIPHENHYDRAMINE HCL 50 MG/ML IJ SOLN
50.0000 mg | Freq: Once | INTRAMUSCULAR | Status: AC
  Administered 2023-05-13: 50 mg via INTRAVENOUS
  Filled 2023-05-13: qty 1

## 2023-05-13 MED ORDER — LORAZEPAM 2 MG/ML IJ SOLN
2.0000 mg | Freq: Once | INTRAMUSCULAR | Status: AC
  Administered 2023-05-13: 2 mg via INTRAVENOUS
  Filled 2023-05-13: qty 1

## 2023-05-13 NOTE — Discharge Instructions (Signed)
Your laboratories also are within normal limits today.  You ultimately need follow-up with a gastroenterologist.  Please continue to take your medication for blood sugar control.

## 2023-05-13 NOTE — ED Notes (Signed)
Reviewed discharge instructions, medications, and home care with pt. Pt verbalized understanding and had no further questions. Pt exited ED without complications.

## 2023-05-13 NOTE — ED Notes (Signed)
Pt ambulated to the restroom with even steady gait, no apparent distress.

## 2023-05-13 NOTE — ED Triage Notes (Signed)
She reports vomiting began this morning without fever.

## 2023-05-13 NOTE — ED Notes (Signed)
Tolerating PO water at this time. Med pass completed without incident.

## 2023-05-13 NOTE — ED Triage Notes (Signed)
Brought in my family for " Diabetic coma"

## 2023-05-13 NOTE — ED Provider Notes (Signed)
[place] EMERGENCY DEPARTMENT AT [facility] Provider Note   CSN: [id number] Arrival date & time: 05/13/23  0957     History T1DM, gastroparesis Chief Complaint  Patient presents with   Emesis    [name] is a 37 y.o. female.  37 y.o female with a PMH of T1DM, Chronic constipation presents to the ED with a chief complaint of nausea, vomiting and abdominal pain which worsen this morning. Evaluate in the ED three days ago with a negative workup and also provided with GI referral. Today she reports multiple episodes of nausea and vomiting, non bloody non bilious. She has tried taking reglan along with her scheduled hydrocodone without any improvement in her pain. Blood sugar at home have not been elevated, on arrival CBG 140's. She denies any prior surgery to her abdomen. Last bowel movement on arrival to the ED without any blood. No fever, no hematemesis, denies any recent THC use.   The history is provided by the patient and medical records.  Emesis Associated symptoms: abdominal pain   Associated symptoms: no chills and no fever        Home Medications Prior to Admission medications   Medication Sig Start Date End Date Taking? Authorizing Provider  Blood Glucose Monitoring Suppl DEVI 1 each by Does not apply route in the morning, at noon, and at bedtime. May substitute based on patient's preference and/or insurance. 04/06/23   Almon Hercules, MD  Continuous Blood Gluc Transmit (DEXCOM G6 TRANSMITTER) MISC Inject 1 each into the skin every 14 (fourteen) days. 12/31/21   Dulce Sellar, NP  cyanocobalamin (VITAMIN B12) 1000 MCG/ML injection Inject 1,000 mcg into the muscle See admin instructions. 1000 mcg injected IM every Tuesday. 08/31/22   [provider]  DULoxetine (CYMBALTA) 60 MG capsule Take 60 mg by mouth daily. 06/28/22   [provider]  insulin aspart (NOVOLOG) 100 UNIT/ML FlexPen Inject 0-18 Units into the skin 3 (three) times  daily with meals. Check Blood Glucose (BG) and inject per scale: BG <150= 0 unit; BG 150-200= 3 unit; BG 201-250= 6 unit; BG 251-300= 9 unit; BG 301-350= 12 unit; BG 351-400= 15 unit; BG >400= 18 unit and Call Primary Care. 02/04/23 05/06/23  Dahal, Melina Schools, MD  insulin detemir (LEVEMIR FLEXTOUCH) 100 UNIT/ML FlexPen Inject 15 Units into the skin 2 (two) times daily. Patient taking differently: Inject 60 Units into the skin at bedtime. 02/04/23   Lorin Glass, MD  Insulin Pen Needle (B-D UF III MINI PEN NEEDLES) 31G X 5 MM MISC To use with Insulin pens, as directed 11/20/21   Dulce Sellar, NP  Insulin Syringes, Disposable, U-100 1 ML MISC Inject 1 each into the skin in the morning and at bedtime. 08/10/21   Dulce Sellar, NP  metoCLOPramide (REGLAN) 10 MG tablet Take 1 tablet (10 mg total) by mouth every 6 (six) hours. 05/10/23   Alvira Monday, MD  oxyCODONE (ROXICODONE) 15 MG immediate release tablet Take 15 mg by mouth every 6 (six) hours as needed for pain.    [provider]      Allergies    Mirapex [pramipexole]    Review of Systems   Review of Systems  Constitutional:  Negative for chills and fever.  Respiratory:  Negative for shortness of breath.   Cardiovascular:  Negative for chest pain.  Gastrointestinal:  Positive for abdominal pain, nausea and vomiting.  Genitourinary:  Negative for flank pain.  All other systems reviewed and are negative.  Physical Exam Updated Vital Signs BP (!) 192/115   Pulse 100   Temp 98.1 F (36.7 C)   Resp (!) 46   LMP 05/03/2023 (Exact Date)   SpO2 99%  Physical Exam Vitals and nursing note reviewed.  Constitutional:      Appearance: Normal appearance.     Comments: Not actively vomiting.   HENT:     Head: Normocephalic and atraumatic.     Mouth/Throat:     Mouth: Mucous membranes are dry.  Eyes:     Pupils: Pupils are equal, round, and reactive to light.  Cardiovascular:     Rate and Rhythm: Normal rate.  Pulmonary:      Effort: Pulmonary effort is normal.  Abdominal:     General: Abdomen is flat.     Palpations: Abdomen is soft.     Tenderness: There is abdominal tenderness. There is no guarding or rebound.     Comments: Generalized TTP without focal point of tenderness. No guarding.   Skin:    General: Skin is warm and dry.  Neurological:     Mental Status: She is alert and oriented to person, place, and time.     ED Results / Procedures / Treatments   Labs (all labs ordered are listed, but only abnormal results are displayed) Labs Reviewed  COMPREHENSIVE METABOLIC PANEL - Abnormal; Notable for the following components:      Result Value   Potassium 3.4 (*)    Glucose, Bld 149 (*)    All other components within normal limits  URINALYSIS, ROUTINE W REFLEX MICROSCOPIC - Abnormal; Notable for the following components:   Color, Urine STRAW (*)    All other components within normal limits  RAPID URINE DRUG SCREEN, HOSP PERFORMED - Abnormal; Notable for the following components:   Tetrahydrocannabinol POSITIVE (*)    All other components within normal limits  CBG MONITORING, ED - Abnormal; Notable for the following components:   Glucose-Capillary 146 (*)    All other components within normal limits  CBC WITH DIFFERENTIAL/PLATELET  LIPASE, BLOOD    EKG None  Radiology No results found.  Procedures Procedures    Medications Ordered in ED Medications  LORazepam (ATIVAN) injection 2 mg (2 mg Intravenous Given 05/13/23 1157)  sodium chloride 0.9 % bolus 1,000 mL (0 mLs Intravenous Stopped 05/13/23 1317)  morphine (PF) 4 MG/ML injection 4 mg (4 mg Intravenous Given 05/13/23 1315)    ED Course/ Medical Decision Making/ A&P Clinical Course as of 05/13/23 1418  Tue May 13, 2023  1047 Glucose-Capillary(!): 146 [JS]  1412 Tetrahydrocannabinol(!): POSITIVE [JS]    Clinical Course User Index [JS] Claude Manges, PA-C                                 Medical Decision Making Amount and/or  Complexity of Data Reviewed Labs: ordered. Decision-making details documented in ED Course.  Risk Prescription drug management.   This patient presents to the ED for concern of abdominal pain, nausea, vomiting, this involves a number of treatment options, and is a complaint that carries with it a high risk of complications and morbidity.  The differential diagnosis includes obstruction, cholecystitis, recurrent gastroparesis versus hypermesis cannaboid.    Co morbidities: Discussed in HPI   Brief History:   SEE HPI  EMR reviewed including pt PMHx, past surgical history and past visits to ER.   See HPI for more details   Lab Tests:  I ordered and independently interpreted labs.  The pertinent results include:    I personally reviewed all laboratory work and imaging. Metabolic panel without any acute abnormality specifically kidney function within normal limits and no significant electrolyte abnormalities. CBC without leukocytosis or significant anemia.  UDS is positive for THC, although patient continues to voice that she no longer endorses marijuana use.   Imaging Studies:  No imaging studies ordered for this patient Abdominal exam is benign, recurrent visits for nausea and vomiting with a positive UDS for THC, have a higher suspicion for hyperemesis cannabinoid versus gastroparesis.   Cardiac Monitoring:  The patient was maintained on a cardiac monitor.  I personally viewed and interpreted the cardiac monitored which showed an underlying rhythm of: Sinus tachycardia, QTC 360 EKG non-ischemic   Medicines ordered:  I ordered medication including ativan, morphine, bolus  for symptomatic treatment Reevaluation of the patient after these medicines showed that the patient improved I have reviewed the patients home medicines and have made adjustments as needed  Reevaluation:  After the interventions noted above I re-evaluated patient and found that they have  :improved   Social Determinants of Health:  The patient's social determinants of health were a factor in the care of this patient  Problem List / ED Course:  Patient presents to the ED with a recurrent visit after 3 days for nausea, vomiting, abdominal pain.  Evaluated in the emergency department previously diagnosed with gastroparesis, reports she has called the GI specialist but has not schedule an appointment with them.  She arrives today hemodynamically stable, slight elevation of her heart rate but CBG on arrival was in the 140s.  She is not actively vomiting at this time, her abdomen is soft, not focally tender to palpation.  She does not have any prior history of surgeries.  She does report her last bowel movement was 30 minutes upon arrival to the emergency department. The rest of her exam is benign.  She describes that the only thing that helps the pain is Dilaudid, she was given this previously, she is also on chronic pain medication at home hydrocodone did take this this morning to help with pain control.  I discussed with patient that her hydrocodone could actually be exacerbating her symptoms, although I have a low suspicion for obstruction at this time with good bowel sounds and a bowel movement while in the ED.  She tells me that she no longer uses marijuana, her UDS machine is currently not working at this time.  UA without any nitrites or leukocytes and she denies any urinary symptoms.  Her CBC without any leukocytosis.  CMP with slight decrease in potassium but stable.  Creatinine level is normal, LFTs are normal I do not suspect gallbladder pathology at this time. I discussed with her given her diagnoses of QTc prolongation, although QTc is normal today, she did take some Reglan prior to arrival in the emergency department.  Will give fluids, Ativan to help with symptomatic treatment.  I have no suspicion for DKA at this time as there is no anion gap.  She also tells me that her blood  sugars have not been running typically high at home.  I do not suspect HHS. Patient has been in the emergency department for approximately 4 hours, no active episode of vomiting has occurred.  She did receive medications for symptomatic treatment such as morphine, Ativan, bolus with improvement in her symptoms.  She is currently passing p.o. trial.  She does have  a prior referral to gastroenterology.  She denied any THC use, however UDS on today's visit does show THC present, suspicion for hyperemesis cannabinoid versus gastroparesis, without any active episode of vomiting and improvement in symptoms I do feel that patient is appropriate for outpatient treatment.  No signs of DKA versus HHS as well.  Stable for discharge.  Dispostion:  After consideration of the diagnostic results and the patients response to treatment, I feel that the patent would benefit from follow up with gastroenterology.     Portions of this note were generated with Scientist, clinical (histocompatibility and immunogenetics). Dictation errors may occur despite best attempts at proofreading.   Final Clinical Impression(s) / ED Diagnoses Final diagnoses:  Generalized abdominal pain    Rx / DC Orders ED Discharge Orders     None         Claude Manges, PA-C 05/13/23 1418    Coral Spikes, DO 05/13/23 1512

## 2023-05-13 NOTE — ED Notes (Signed)
Pt appears to be sitting in bed on monitor in bra, this RN offered pt a gown and pt refused.

## 2023-05-13 NOTE — Discharge Instructions (Signed)
As we discussed, your blood sugar was elevated.  Please continue to take your insulin as prescribed.  Please also take your nausea medicine as prescribed by your doctor and your pain medicine prescribed by your pain management doctor  You need to discuss with your pain management doctor about how to manage your pain and prevent withdrawals  Return to ER if you have severe abdominal pain or vomiting or dehydration

## 2023-05-13 NOTE — ED Notes (Signed)
Reviewed discharge instructions and home care with pt. Pt verbalized understanding and had no further questions. Pt exited ED without complications.

## 2023-05-13 NOTE — ED Provider Notes (Signed)
[place] EMERGENCY DEPARTMENT AT [facility] Provider Note   CSN: [id number] Arrival date & time: 05/13/23  1842     History  Chief Complaint  Patient presents with   Hyperglycemia    [name] is a 37 y.o. female history of diabetes, previous DKA, diabetic gastroparesis here presenting with vomiting and hyperglycemia.  Patient states that she was seen here earlier and was not given any pain medicine.  She is on oxycodone at baseline.  She states that she was unable to keep anything down her blood sugar went from 140 to 260.  Patient states that she has worsening abdominal pain.  Patient was recently seen here for similar symptoms.  Patient also has recent admission for diabetic gastroparesis.  Patient tested positive for marijuana earlier today  The history is provided by the patient.       Home Medications Prior to Admission medications   Medication Sig Start Date End Date Taking? Authorizing Provider  Blood Glucose Monitoring Suppl DEVI 1 each by Does not apply route in the morning, at noon, and at bedtime. May substitute based on patient's preference and/or insurance. 04/06/23   Almon Hercules, MD  Continuous Blood Gluc Transmit (DEXCOM G6 TRANSMITTER) MISC Inject 1 each into the skin every 14 (fourteen) days. 12/31/21   Dulce Sellar, NP  cyanocobalamin (VITAMIN B12) 1000 MCG/ML injection Inject 1,000 mcg into the muscle See admin instructions. 1000 mcg injected IM every Tuesday. 08/31/22   [provider]  DULoxetine (CYMBALTA) 60 MG capsule Take 60 mg by mouth daily. 06/28/22   [provider]  insulin aspart (NOVOLOG) 100 UNIT/ML FlexPen Inject 0-18 Units into the skin 3 (three) times daily with meals. Check Blood Glucose (BG) and inject per scale: BG <150= 0 unit; BG 150-200= 3 unit; BG 201-250= 6 unit; BG 251-300= 9 unit; BG 301-350= 12 unit; BG 351-400= 15 unit; BG >400= 18 unit and Call Primary Care. 02/04/23 05/06/23  Dahal, Melina Schools,  MD  insulin detemir (LEVEMIR FLEXTOUCH) 100 UNIT/ML FlexPen Inject 15 Units into the skin 2 (two) times daily. Patient taking differently: Inject 60 Units into the skin at bedtime. 02/04/23   Lorin Glass, MD  Insulin Pen Needle (B-D UF III MINI PEN NEEDLES) 31G X 5 MM MISC To use with Insulin pens, as directed 11/20/21   Dulce Sellar, NP  Insulin Syringes, Disposable, U-100 1 ML MISC Inject 1 each into the skin in the morning and at bedtime. 08/10/21   Dulce Sellar, NP  metoCLOPramide (REGLAN) 10 MG tablet Take 1 tablet (10 mg total) by mouth every 6 (six) hours. 05/10/23   Alvira Monday, MD  oxyCODONE (ROXICODONE) 15 MG immediate release tablet Take 15 mg by mouth every 6 (six) hours as needed for pain.    [provider]      Allergies    Mirapex [pramipexole]    Review of Systems   Review of Systems  Gastrointestinal:  Positive for abdominal pain and vomiting.  All other systems reviewed and are negative.   Physical Exam Updated Vital Signs BP (!) 184/101 (BP Location: Right Arm)   Pulse (!) 104   Temp 98.4 F (36.9 C) (Oral)   Resp 19   LMP 05/03/2023 (Exact Date)   SpO2 100%  Physical Exam Vitals and nursing note reviewed.  Constitutional:      Comments: Uncomfortable and crying in pain  HENT:     Head: Normocephalic.     Nose: Nose normal.  Mouth/Throat:     Mouth: Mucous membranes are dry.  Eyes:     Extraocular Movements: Extraocular movements intact.     Pupils: Pupils are equal, round, and reactive to light.  Cardiovascular:     Rate and Rhythm: Regular rhythm. Tachycardia present.     Pulses: Normal pulses.  Pulmonary:     Effort: Pulmonary effort is normal.     Breath sounds: Normal breath sounds.  Abdominal:     General: Abdomen is flat.     Palpations: Abdomen is soft.     Comments: Mild epigastric tenderness  Musculoskeletal:        General: Normal range of motion.     Cervical back: Normal range of motion and neck supple.   Skin:    General: Skin is warm.     Capillary Refill: Capillary refill takes less than 2 seconds.  Neurological:     General: No focal deficit present.     Mental Status: She is oriented to person, place, and time.  Psychiatric:        Mood and Affect: Mood normal.        Behavior: Behavior normal.     ED Results / Procedures / Treatments   Labs (all labs ordered are listed, but only abnormal results are displayed) Labs Reviewed  CBC WITH DIFFERENTIAL/PLATELET - Abnormal; Notable for the following components:      Result Value   Hemoglobin 11.9 (*)    HCT 34.9 (*)    All other components within normal limits  CBG MONITORING, ED - Abnormal; Notable for the following components:   Glucose-Capillary 261 (*)    All other components within normal limits  I-STAT VENOUS BLOOD GAS, ED - Abnormal; Notable for the following components:   pO2, Ven 31 (*)    All other components within normal limits  COMPREHENSIVE METABOLIC PANEL  CBG MONITORING, ED    EKG None  Radiology No results found.  Procedures Procedures    Medications Ordered in ED Medications  LORazepam (ATIVAN) injection 1 mg (has no administration in time range)  metoCLOPramide (REGLAN) injection 10 mg (has no administration in time range)  diphenhydrAMINE (BENADRYL) injection 50 mg (has no administration in time range)  HYDROmorphone (DILAUDID) injection 1 mg (has no administration in time range)  sodium chloride 0.9 % bolus 1,000 mL (1,000 mLs Intravenous New Bag/Given 05/13/23 1917)    ED Course/ Medical Decision Making/ A&P                                 Medical Decision Making Maryama L Chelsea Aus Steffanie Dunn is a 37 y.o. female here presenting with abdominal pain and vomiting. Patient has history of gastroparesis and is positive for marijuana.  Consider cannabis hyperemesis as well.  I do not think she has an obstruction.  Plan to get CBC CMP and lipase and hydrate patient and give antiemetics and  reassess.  8:26 PM I reviewed patient's labs.  Labs showed glucose of 270.  Patient's anion gap is normal and patient is not acidotic.  Given IV fluids and patient's glucose down to 200.  Patient's pain is now under control.  Patient is stable for discharge.  I told her to follow-up with her pain management doctor  Problems Addressed: Hyperglycemia: acute illness or injury Nausea and vomiting, unspecified vomiting type: acute illness or injury  Amount and/or Complexity of Data Reviewed Labs: ordered. Decision-making details documented in ED  Course.  Risk Prescription drug management.    Final Clinical Impression(s) / ED Diagnoses Final diagnoses:  None    Rx / DC Orders ED Discharge Orders     None         Charlynne Pander, MD 05/13/23 2027

## 2023-05-13 NOTE — ED Notes (Signed)
Pt had one episode of emesis. Linens cleaned.

## 2023-05-20 ENCOUNTER — Telehealth: Payer: Self-pay | Admitting: General Practice

## 2023-05-20 NOTE — Telephone Encounter (Signed)
I have left patient vm in regard to call me back.   Last September Stephanie Hudnell had asked for patient to transition to an MD due to complexity of health history.    Patient has not called back in regard.   Looks like patient has had several hospitalizations and ED visits.  The office has called to follow up in regard to those visit with no answer or call back.  I have left patient vm inquiring if she has transitioned to another primary care facility or do we still need to transition her care to an MD?

## 2023-07-03 ENCOUNTER — Other Ambulatory Visit: Payer: Self-pay

## 2023-07-03 ENCOUNTER — Emergency Department (HOSPITAL_BASED_OUTPATIENT_CLINIC_OR_DEPARTMENT_OTHER)
Admission: EM | Admit: 2023-07-03 | Discharge: 2023-07-03 | Disposition: A | Discharge status: Home / Self Care | Payer: BC Managed Care – PPO | Attending: Emergency Medicine | Admitting: Emergency Medicine

## 2023-07-03 ENCOUNTER — Encounter (HOSPITAL_BASED_OUTPATIENT_CLINIC_OR_DEPARTMENT_OTHER): Payer: Self-pay | Admitting: Emergency Medicine

## 2023-07-03 DIAGNOSIS — R1084 Generalized abdominal pain: Secondary | ICD-10-CM | POA: Insufficient documentation

## 2023-07-03 DIAGNOSIS — Z794 Long term (current) use of insulin: Secondary | ICD-10-CM | POA: Diagnosis not present

## 2023-07-03 DIAGNOSIS — R112 Nausea with vomiting, unspecified: Secondary | ICD-10-CM | POA: Diagnosis not present

## 2023-07-03 LAB — CBC WITH DIFFERENTIAL/PLATELET
Abs Immature Granulocytes: 0.01 10*3/uL (ref 0.00–0.07)
Basophils Absolute: 0 10*3/uL (ref 0.0–0.1)
Basophils Relative: 0 %
Eosinophils Absolute: 0 10*3/uL (ref 0.0–0.5)
Eosinophils Relative: 0 %
HCT: 38.5 % (ref 36.0–46.0)
Hemoglobin: 12.7 g/dL (ref 12.0–15.0)
Immature Granulocytes: 0 %
Lymphocytes Relative: 29 %
Lymphs Abs: 2.1 10*3/uL (ref 0.7–4.0)
MCH: 30.3 pg (ref 26.0–34.0)
MCHC: 33 g/dL (ref 30.0–36.0)
MCV: 91.9 fL (ref 80.0–100.0)
Monocytes Absolute: 0.4 10*3/uL (ref 0.1–1.0)
Monocytes Relative: 5 %
Neutro Abs: 4.8 10*3/uL (ref 1.7–7.7)
Neutrophils Relative %: 66 %
Platelets: 340 10*3/uL (ref 150–400)
RBC: 4.19 MIL/uL (ref 3.87–5.11)
RDW: 13.8 % (ref 11.5–15.5)
WBC: 7.3 10*3/uL (ref 4.0–10.5)
nRBC: 0 % (ref 0.0–0.2)

## 2023-07-03 LAB — COMPREHENSIVE METABOLIC PANEL
ALT: 9 U/L (ref 0–44)
AST: 11 U/L — ABNORMAL LOW (ref 15–41)
Albumin: 4.5 g/dL (ref 3.5–5.0)
Alkaline Phosphatase: 63 U/L (ref 38–126)
Anion gap: 22 — ABNORMAL HIGH (ref 5–15)
BUN: 7 mg/dL (ref 6–20)
CO2: 15 mmol/L — ABNORMAL LOW (ref 22–32)
Calcium: 10.2 mg/dL (ref 8.9–10.3)
Chloride: 98 mmol/L (ref 98–111)
Creatinine, Ser: 0.79 mg/dL (ref 0.44–1.00)
GFR, Estimated: >60 mL/min (ref >60)
Glucose, Bld: 160 mg/dL — ABNORMAL HIGH (ref 70–99)
Potassium: 4.2 mmol/L (ref 3.5–5.1)
Sodium: 135 mmol/L (ref 135–145)
Total Bilirubin: 0.5 mg/dL (ref <1.2)
Total Protein: 8.3 g/dL — ABNORMAL HIGH (ref 6.5–8.1)

## 2023-07-03 LAB — CBG MONITORING, ED: Glucose-Capillary: 149 mg/dL — ABNORMAL HIGH (ref 70–99)

## 2023-07-03 LAB — HCG, SERUM, QUALITATIVE: Preg, Serum: NEGATIVE

## 2023-07-03 LAB — LIPASE, BLOOD: Lipase: <10 U/L — ABNORMAL LOW (ref 11–51)

## 2023-07-03 MED ORDER — DIPHENHYDRAMINE HCL 50 MG/ML IJ SOLN
12.5000 mg | Freq: Once | INTRAMUSCULAR | Status: AC
  Administered 2023-07-03: 12.5 mg via INTRAVENOUS
  Filled 2023-07-03: qty 1

## 2023-07-03 MED ORDER — METOCLOPRAMIDE HCL 5 MG/ML IJ SOLN
5.0000 mg | Freq: Once | INTRAMUSCULAR | Status: AC
  Administered 2023-07-03: 5 mg via INTRAVENOUS
  Filled 2023-07-03: qty 2

## 2023-07-03 MED ORDER — HYDROMORPHONE HCL 1 MG/ML IJ SOLN
1.0000 mg | Freq: Once | INTRAMUSCULAR | Status: AC
  Administered 2023-07-03: 1 mg via INTRAVENOUS
  Filled 2023-07-03: qty 1

## 2023-07-03 MED ORDER — PROMETHAZINE HCL 25 MG RE SUPP: 25.0000 mg | Freq: Four times a day (QID) | RECTAL | 0 refills | Status: DC | PRN

## 2023-07-03 MED ORDER — METOCLOPRAMIDE HCL 5 MG/ML IJ SOLN
10.0000 mg | Freq: Once | INTRAMUSCULAR | Status: AC
  Administered 2023-07-03: 10 mg via INTRAVENOUS
  Filled 2023-07-03: qty 2

## 2023-07-03 MED ORDER — ONDANSETRON 4 MG PO TBDP: ORAL_TABLET | ORAL | 0 refills | Status: DC

## 2023-07-03 MED ORDER — MORPHINE SULFATE (PF) 4 MG/ML IV SOLN
4.0000 mg | Freq: Once | INTRAVENOUS | Status: AC
  Administered 2023-07-03: 4 mg via INTRAVENOUS
  Filled 2023-07-03: qty 1

## 2023-07-03 MED ORDER — SODIUM CHLORIDE 0.9 % IV BOLUS
1000.0000 mL | Freq: Once | INTRAVENOUS | Status: AC
  Administered 2023-07-03: 1000 mL via INTRAVENOUS

## 2023-07-03 NOTE — ED Triage Notes (Signed)
Abdo pain, vomiting and reports low blood glucose at home.  Started yesterday

## 2023-07-03 NOTE — ED Provider Notes (Signed)
Received patient in turnover from Dr. Wilkie Aye.  Please see their note for further details of Hx, PE.  Briefly patient is a 37 y.o. female with a Emesis .  The patient has a history of cyclic vomiting syndrome gastroparesis has had multiple ED visits and admissions for the same.  Plan to give a second antiemetic and reassess.  Patient is feeling quite a bit better on repeat assessment.  She is able to tolerate by mouth here.  She would like to go home at this time.Adela Lank, Jesusita Oka, DO 07/03/23 1721

## 2023-07-03 NOTE — ED Provider Notes (Signed)
[place] EMERGENCY DEPARTMENT AT [facility] Provider Note   CSN: [id number] Arrival date & time: 07/03/23  1131     History  Chief Complaint  Patient presents with   Emesis    [name] is a 37 y.o. female.  HPI   37 year old female with reported history of gastroparesis presents emergency department abdominal pain and vomiting.  Patient states that this started yesterday.  Feels similar to her previous episodes.  She is complaining of generalized abdominal cramping with nonbloody emesis.  Denies any fever, diarrhea or genitourinary symptoms.  She frequently has to come to the hospital for IV medication.  Denies any other acute changes in her health.  Home Medications Prior to Admission medications   Medication Sig Start Date End Date Taking? Authorizing Provider  Blood Glucose Monitoring Suppl DEVI 1 each by Does not apply route in the morning, at noon, and at bedtime. May substitute based on patient's preference and/or insurance. 04/06/23   Almon Hercules, MD  Continuous Blood Gluc Transmit (DEXCOM G6 TRANSMITTER) MISC Inject 1 each into the skin every 14 (fourteen) days. 12/31/21   Dulce Sellar, NP  cyanocobalamin (VITAMIN B12) 1000 MCG/ML injection Inject 1,000 mcg into the muscle See admin instructions. 1000 mcg injected IM every Tuesday. 08/31/22   [provider]  DULoxetine (CYMBALTA) 60 MG capsule Take 60 mg by mouth daily. 06/28/22   [provider]  insulin aspart (NOVOLOG) 100 UNIT/ML FlexPen Inject 0-18 Units into the skin 3 (three) times daily with meals. Check Blood Glucose (BG) and inject per scale: BG <150= 0 unit; BG 150-200= 3 unit; BG 201-250= 6 unit; BG 251-300= 9 unit; BG 301-350= 12 unit; BG 351-400= 15 unit; BG >400= 18 unit and Call Primary Care. 02/04/23 05/06/23  Dahal, Melina Schools, MD  insulin detemir (LEVEMIR FLEXTOUCH) 100 UNIT/ML FlexPen Inject 15 Units into the skin 2 (two) times daily. Patient taking differently:  Inject 60 Units into the skin at bedtime. 02/04/23   Lorin Glass, MD  Insulin Pen Needle (B-D UF III MINI PEN NEEDLES) 31G X 5 MM MISC To use with Insulin pens, as directed 11/20/21   Dulce Sellar, NP  Insulin Syringes, Disposable, U-100 1 ML MISC Inject 1 each into the skin in the morning and at bedtime. 08/10/21   Dulce Sellar, NP  metoCLOPramide (REGLAN) 10 MG tablet Take 1 tablet (10 mg total) by mouth every 6 (six) hours. 05/10/23   Alvira Monday, MD  oxyCODONE (ROXICODONE) 15 MG immediate release tablet Take 15 mg by mouth every 6 (six) hours as needed for pain.    [provider]      Allergies    Mirapex [pramipexole]    Review of Systems   Review of Systems  Constitutional:  Negative for fever.  Respiratory:  Negative for shortness of breath.   Cardiovascular:  Negative for chest pain.  Gastrointestinal:  Positive for abdominal pain, nausea and vomiting. Negative for blood in stool and diarrhea.  Genitourinary:  Negative for pelvic pain.  Skin:  Negative for rash.  Neurological:  Negative for headaches.    Physical Exam Updated Vital Signs BP (!) 150/84   Pulse (!) 125   Temp 98.2 F (36.8 C)   Resp 14   LMP 06/30/2023   SpO2 100%  Physical Exam Vitals and nursing note reviewed.  Constitutional:      General: She is not in acute distress.    Appearance: Normal appearance.  HENT:     Head:  Normocephalic.     Mouth/Throat:     Mouth: Mucous membranes are moist.  Cardiovascular:     Rate and Rhythm: Normal rate.  Pulmonary:     Effort: Pulmonary effort is normal. No respiratory distress.  Abdominal:     General: Bowel sounds are normal. There is no distension.     Palpations: Abdomen is soft.     Tenderness: There is abdominal tenderness. There is no guarding or rebound.  Skin:    General: Skin is warm.  Neurological:     Mental Status: She is alert and oriented to person, place, and time. Mental status is at baseline.  Psychiatric:         Mood and Affect: Mood normal.     ED Results / Procedures / Treatments   Labs (all labs ordered are listed, but only abnormal results are displayed) Labs Reviewed  CBG MONITORING, ED - Abnormal; Notable for the following components:      Result Value   Glucose-Capillary 149 (*)    All other components within normal limits  CBC WITH DIFFERENTIAL/PLATELET  COMPREHENSIVE METABOLIC PANEL  LIPASE, BLOOD  HCG, SERUM, QUALITATIVE    EKG None  Radiology No results found.  Procedures Procedures    Medications Ordered in ED Medications  HYDROmorphone (DILAUDID) injection 1 mg (has no administration in time range)  metoCLOPramide (REGLAN) injection 10 mg (has no administration in time range)  diphenhydrAMINE (BENADRYL) injection 12.5 mg (has no administration in time range)  sodium chloride 0.9 % bolus 1,000 mL (1,000 mLs Intravenous New Bag/Given 07/03/23 1403)    ED Course/ Medical Decision Making/ A&P                                 Medical Decision Making Amount and/or Complexity of Data Reviewed Labs: ordered.  Risk Prescription drug management.   37 year old female with past medical history of gastroparesis presents emergency department with generalized abdominal pain, nausea/vomiting.  Patient is tachycardic on arrival, afebrile, diffusely tender abdomen but no peritonitis.  Blood work is reassuring with no leukocytosis, pregnancy test is negative.  Abdominal labs are normal.  Patient has history of prolonged QTc, EKG today looks appropriate but will treat based off of history of this.  After first round of medication she feels slightly improved but is not been able to p.o.  Will order a second round of medication.  Attempted p.o. after that, if she fails she may require inpatient treatment.  Patient signed out pending reevaluation.        Final Clinical Impression(s) / ED Diagnoses Final diagnoses:  None    Rx / DC Orders ED Discharge Orders      None         Rozelle Logan, DO 07/03/23 1526

## 2023-07-03 NOTE — Discharge Instructions (Signed)
Follow-up with your family doctor and gastroenterologist in the office.  Please return for sudden worsening pain inability to eat or drink or if you develop a fever.

## 2023-08-05 ENCOUNTER — Encounter (HOSPITAL_BASED_OUTPATIENT_CLINIC_OR_DEPARTMENT_OTHER): Payer: Self-pay | Admitting: Emergency Medicine

## 2023-08-05 ENCOUNTER — Inpatient Hospital Stay (HOSPITAL_BASED_OUTPATIENT_CLINIC_OR_DEPARTMENT_OTHER)
Admission: EM | Admit: 2023-08-05 | Discharge: 2023-08-07 | DRG: 638 | Disposition: A | Discharge status: Home / Self Care | Payer: BC Managed Care – PPO | Attending: Internal Medicine | Admitting: Internal Medicine

## 2023-08-05 ENCOUNTER — Other Ambulatory Visit: Payer: Self-pay

## 2023-08-05 DIAGNOSIS — N179 Acute kidney failure, unspecified: Secondary | ICD-10-CM | POA: Diagnosis present

## 2023-08-05 DIAGNOSIS — K5909 Other constipation: Secondary | ICD-10-CM | POA: Diagnosis present

## 2023-08-05 DIAGNOSIS — K3184 Gastroparesis: Secondary | ICD-10-CM | POA: Diagnosis present

## 2023-08-05 DIAGNOSIS — T40605A Adverse effect of unspecified narcotics, initial encounter: Secondary | ICD-10-CM | POA: Diagnosis present

## 2023-08-05 DIAGNOSIS — F419 Anxiety disorder, unspecified: Secondary | ICD-10-CM | POA: Diagnosis not present

## 2023-08-05 DIAGNOSIS — Z8249 Family history of ischemic heart disease and other diseases of the circulatory system: Secondary | ICD-10-CM

## 2023-08-05 DIAGNOSIS — E860 Dehydration: Secondary | ICD-10-CM | POA: Diagnosis present

## 2023-08-05 DIAGNOSIS — Z833 Family history of diabetes mellitus: Secondary | ICD-10-CM | POA: Diagnosis not present

## 2023-08-05 DIAGNOSIS — K5900 Constipation, unspecified: Secondary | ICD-10-CM | POA: Diagnosis not present

## 2023-08-05 DIAGNOSIS — F32A Depression, unspecified: Secondary | ICD-10-CM | POA: Diagnosis present

## 2023-08-05 DIAGNOSIS — Z888 Allergy status to other drugs, medicaments and biological substances status: Secondary | ICD-10-CM

## 2023-08-05 DIAGNOSIS — Z83511 Family history of glaucoma: Secondary | ICD-10-CM

## 2023-08-05 DIAGNOSIS — Z794 Long term (current) use of insulin: Secondary | ICD-10-CM

## 2023-08-05 DIAGNOSIS — E1010 Type 1 diabetes mellitus with ketoacidosis without coma: Principal | ICD-10-CM | POA: Diagnosis present

## 2023-08-05 DIAGNOSIS — E876 Hypokalemia: Secondary | ICD-10-CM | POA: Diagnosis present

## 2023-08-05 DIAGNOSIS — Z91141 Patient's other noncompliance with medication regimen due to financial hardship: Secondary | ICD-10-CM | POA: Diagnosis not present

## 2023-08-05 DIAGNOSIS — R112 Nausea with vomiting, unspecified: Secondary | ICD-10-CM | POA: Diagnosis present

## 2023-08-05 DIAGNOSIS — Z79899 Other long term (current) drug therapy: Secondary | ICD-10-CM

## 2023-08-05 DIAGNOSIS — E1042 Type 1 diabetes mellitus with diabetic polyneuropathy: Secondary | ICD-10-CM | POA: Diagnosis not present

## 2023-08-05 DIAGNOSIS — E1043 Type 1 diabetes mellitus with diabetic autonomic (poly)neuropathy: Secondary | ICD-10-CM | POA: Diagnosis present

## 2023-08-05 DIAGNOSIS — Z79891 Long term (current) use of opiate analgesic: Secondary | ICD-10-CM

## 2023-08-05 DIAGNOSIS — E1065 Type 1 diabetes mellitus with hyperglycemia: Secondary | ICD-10-CM | POA: Diagnosis present

## 2023-08-05 DIAGNOSIS — E1165 Type 2 diabetes mellitus with hyperglycemia: Secondary | ICD-10-CM

## 2023-08-05 DIAGNOSIS — R739 Hyperglycemia, unspecified: Principal | ICD-10-CM

## 2023-08-05 DIAGNOSIS — E1110 Type 2 diabetes mellitus with ketoacidosis without coma: Secondary | ICD-10-CM | POA: Diagnosis present

## 2023-08-05 LAB — GLUCOSE, CAPILLARY
Glucose-Capillary: 186 mg/dL — ABNORMAL HIGH (ref 70–99)
Glucose-Capillary: 224 mg/dL — ABNORMAL HIGH (ref 70–99)
Glucose-Capillary: 215 mg/dL — ABNORMAL HIGH (ref 70–99)
Glucose-Capillary: 199 mg/dL — ABNORMAL HIGH (ref 70–99)
Glucose-Capillary: 210 mg/dL — ABNORMAL HIGH (ref 70–99)
Glucose-Capillary: 174 mg/dL — ABNORMAL HIGH (ref 70–99)
Glucose-Capillary: 162 mg/dL — ABNORMAL HIGH (ref 70–99)

## 2023-08-05 LAB — CBG MONITORING, ED
Glucose-Capillary: >600 mg/dL (ref 70–99)
Glucose-Capillary: 543 mg/dL (ref 70–99)
Glucose-Capillary: 427 mg/dL — ABNORMAL HIGH (ref 70–99)
Glucose-Capillary: 369 mg/dL — ABNORMAL HIGH (ref 70–99)
Glucose-Capillary: 251 mg/dL — ABNORMAL HIGH (ref 70–99)
Glucose-Capillary: 222 mg/dL — ABNORMAL HIGH (ref 70–99)
Glucose-Capillary: 178 mg/dL — ABNORMAL HIGH (ref 70–99)

## 2023-08-05 LAB — COMPREHENSIVE METABOLIC PANEL
ALT: 9 U/L (ref 0–44)
AST: 11 U/L — ABNORMAL LOW (ref 15–41)
Albumin: 4.3 g/dL (ref 3.5–5.0)
Alkaline Phosphatase: 83 U/L (ref 38–126)
Anion gap: 17 — ABNORMAL HIGH (ref 5–15)
BUN: 20 mg/dL (ref 6–20)
CO2: 26 mmol/L (ref 22–32)
Calcium: 10.4 mg/dL — ABNORMAL HIGH (ref 8.9–10.3)
Chloride: 88 mmol/L — ABNORMAL LOW (ref 98–111)
Creatinine, Ser: 1.2 mg/dL — ABNORMAL HIGH (ref 0.44–1.00)
GFR, Estimated: >60 mL/min (ref >60)
Glucose, Bld: 701 mg/dL (ref 70–99)
Potassium: 4.5 mmol/L (ref 3.5–5.1)
Sodium: 131 mmol/L — ABNORMAL LOW (ref 135–145)
Total Bilirubin: 0.3 mg/dL (ref <1.2)
Total Protein: 8 g/dL (ref 6.5–8.1)

## 2023-08-05 LAB — I-STAT VENOUS BLOOD GAS, ED
Acid-Base Excess: 1 mmol/L (ref 0.0–2.0)
Bicarbonate: 24.6 mmol/L (ref 20.0–28.0)
Calcium, Ion: 1.21 mmol/L (ref 1.15–1.40)
HCT: 42 % (ref 36.0–46.0)
Hemoglobin: 14.3 g/dL (ref 12.0–15.0)
O2 Saturation: 70 %
Patient temperature: 97.5
Potassium: 4.2 mmol/L (ref 3.5–5.1)
Sodium: 132 mmol/L — ABNORMAL LOW (ref 135–145)
TCO2: 26 mmol/L (ref 22–32)
pCO2, Ven: 33.5 mm[Hg] — ABNORMAL LOW (ref 44–60)
pH, Ven: 7.472 — ABNORMAL HIGH (ref 7.25–7.43)
pO2, Ven: 33 mm[Hg] (ref 32–45)

## 2023-08-05 LAB — CBC WITH DIFFERENTIAL/PLATELET
Abs Immature Granulocytes: 0.03 10*3/uL (ref 0.00–0.07)
Basophils Absolute: 0 10*3/uL (ref 0.0–0.1)
Basophils Relative: 0 %
Eosinophils Absolute: 0.1 10*3/uL (ref 0.0–0.5)
Eosinophils Relative: 1 %
HCT: 40.8 % (ref 36.0–46.0)
Hemoglobin: 14.3 g/dL (ref 12.0–15.0)
Immature Granulocytes: 0 %
Lymphocytes Relative: 26 %
Lymphs Abs: 2.8 10*3/uL (ref 0.7–4.0)
MCH: 30.4 pg (ref 26.0–34.0)
MCHC: 35 g/dL (ref 30.0–36.0)
MCV: 86.8 fL (ref 80.0–100.0)
Monocytes Absolute: 0.5 10*3/uL (ref 0.1–1.0)
Monocytes Relative: 5 %
Neutro Abs: 7.3 10*3/uL (ref 1.7–7.7)
Neutrophils Relative %: 68 %
Platelets: 333 10*3/uL (ref 150–400)
RBC: 4.7 MIL/uL (ref 3.87–5.11)
RDW: 12.4 % (ref 11.5–15.5)
WBC: 10.8 10*3/uL — ABNORMAL HIGH (ref 4.0–10.5)
nRBC: 0 % (ref 0.0–0.2)

## 2023-08-05 LAB — CBC
HCT: 39 % (ref 36.0–46.0)
Hemoglobin: 13.5 g/dL (ref 12.0–15.0)
MCH: 31.2 pg (ref 26.0–34.0)
MCHC: 34.6 g/dL (ref 30.0–36.0)
MCV: 90.1 fL (ref 80.0–100.0)
Platelets: 284 10*3/uL (ref 150–400)
RBC: 4.33 MIL/uL (ref 3.87–5.11)
RDW: 12.5 % (ref 11.5–15.5)
WBC: 11.9 10*3/uL — ABNORMAL HIGH (ref 4.0–10.5)
nRBC: 0 % (ref 0.0–0.2)

## 2023-08-05 LAB — BASIC METABOLIC PANEL
Anion gap: 13 (ref 5–15)
BUN: 17 mg/dL (ref 6–20)
CO2: 23 mmol/L (ref 22–32)
Calcium: 9.9 mg/dL (ref 8.9–10.3)
Chloride: 102 mmol/L (ref 98–111)
Creatinine, Ser: 0.69 mg/dL (ref 0.44–1.00)
GFR, Estimated: >60 mL/min (ref >60)
Glucose, Bld: 212 mg/dL — ABNORMAL HIGH (ref 70–99)
Potassium: 4 mmol/L (ref 3.5–5.1)
Sodium: 138 mmol/L (ref 135–145)

## 2023-08-05 LAB — HCG, SERUM, QUALITATIVE: Preg, Serum: NEGATIVE

## 2023-08-05 LAB — LIPASE, BLOOD: Lipase: 14 U/L (ref 11–51)

## 2023-08-05 LAB — BETA-HYDROXYBUTYRIC ACID
Beta-Hydroxybutyric Acid: 3.68 mmol/L — ABNORMAL HIGH (ref 0.05–0.27)
Beta-Hydroxybutyric Acid: 0.85 mmol/L — ABNORMAL HIGH (ref 0.05–0.27)
Beta-Hydroxybutyric Acid: 0.38 mmol/L — ABNORMAL HIGH (ref 0.05–0.27)

## 2023-08-05 LAB — MRSA NEXT GEN BY PCR, NASAL: MRSA by PCR Next Gen: DETECTED — AB

## 2023-08-05 MED ORDER — METOCLOPRAMIDE HCL 5 MG/ML IJ SOLN
5.0000 mg | Freq: Once | INTRAMUSCULAR | Status: AC
  Administered 2023-08-05: 5 mg via INTRAVENOUS
  Filled 2023-08-05: qty 2

## 2023-08-05 MED ORDER — ONDANSETRON HCL 4 MG/2ML IJ SOLN: 4.0000 mg | Freq: Once | INTRAMUSCULAR | Status: AC

## 2023-08-05 MED ORDER — MUPIROCIN 2 % EX OINT
1.0000 | TOPICAL_OINTMENT | Freq: Two times a day (BID) | CUTANEOUS | Status: DC
  Administered 2023-08-05 – 2023-08-07 (×4): 1 via NASAL
  Filled 2023-08-05 (×2): qty 22

## 2023-08-05 MED ORDER — ACETAMINOPHEN 650 MG RE SUPP: 650.0000 mg | Freq: Four times a day (QID) | RECTAL | Status: DC | PRN

## 2023-08-05 MED ORDER — HYDRALAZINE HCL 20 MG/ML IJ SOLN
10.0000 mg | Freq: Four times a day (QID) | INTRAMUSCULAR | Status: DC | PRN
Start: 2023-08-05 — End: 2023-08-07
  Administered 2023-08-05: 10 mg via INTRAVENOUS
  Filled 2023-08-05 (×2): qty 1

## 2023-08-05 MED ORDER — INSULIN REGULAR(HUMAN) IN NACL 100-0.9 UT/100ML-% IV SOLN
INTRAVENOUS | Status: DC
  Administered 2023-08-05: 9 [IU]/h via INTRAVENOUS
  Filled 2023-08-05 (×2): qty 100

## 2023-08-05 MED ORDER — POLYETHYLENE GLYCOL 3350 17 G PO PACK: 17.0000 g | PACK | Freq: Every day | ORAL | Status: DC | PRN

## 2023-08-05 MED ORDER — DIPHENHYDRAMINE HCL 50 MG/ML IJ SOLN
12.5000 mg | Freq: Once | INTRAMUSCULAR | Status: AC
Start: 2023-08-05 — End: 2023-08-05
  Administered 2023-08-05: 12.5 mg via INTRAVENOUS
  Filled 2023-08-05: qty 1

## 2023-08-05 MED ORDER — CHLORHEXIDINE GLUCONATE CLOTH 2 % EX PADS
6.0000 | MEDICATED_PAD | Freq: Every day | CUTANEOUS | Status: DC
  Administered 2023-08-05 – 2023-08-06 (×2): 6 via TOPICAL

## 2023-08-05 MED ORDER — ORAL CARE MOUTH RINSE: 15.0000 mL | OROMUCOSAL | Status: DC | PRN

## 2023-08-05 MED ORDER — HYDROMORPHONE HCL 1 MG/ML IJ SOLN
0.5000 mg | Freq: Once | INTRAMUSCULAR | Status: AC
  Administered 2023-08-05: 0.5 mg via INTRAVENOUS
  Filled 2023-08-05: qty 1

## 2023-08-05 MED ORDER — MORPHINE SULFATE (PF) 2 MG/ML IV SOLN
1.0000 mg | INTRAVENOUS | Status: DC | PRN
  Administered 2023-08-05 – 2023-08-07 (×8): 1 mg via INTRAVENOUS
  Filled 2023-08-05 (×8): qty 1

## 2023-08-05 MED ORDER — ONDANSETRON HCL 4 MG/2ML IJ SOLN
INTRAMUSCULAR | Status: AC
  Administered 2023-08-05: 4 mg via INTRAVENOUS
  Filled 2023-08-05: qty 2

## 2023-08-05 MED ORDER — POTASSIUM CHLORIDE 10 MEQ/100ML IV SOLN
10.0000 meq | INTRAVENOUS | Status: AC
  Administered 2023-08-05 (×2): 10 meq via INTRAVENOUS
  Filled 2023-08-05 (×2): qty 100

## 2023-08-05 MED ORDER — DEXTROSE IN LACTATED RINGERS 5 % IV SOLN
INTRAVENOUS | Status: DC
Start: 2023-08-05 — End: 2023-08-06

## 2023-08-05 MED ORDER — OXYCODONE HCL 5 MG PO TABS
5.0000 mg | ORAL_TABLET | Freq: Four times a day (QID) | ORAL | Status: DC | PRN
  Administered 2023-08-06 – 2023-08-07 (×2): 5 mg via ORAL
  Filled 2023-08-05 (×2): qty 1

## 2023-08-05 MED ORDER — SODIUM CHLORIDE 0.9 % IV BOLUS
20.0000 mL/kg | Freq: Once | INTRAVENOUS | Status: AC
  Administered 2023-08-05: 1000 mL via INTRAVENOUS

## 2023-08-05 MED ORDER — ENOXAPARIN SODIUM 40 MG/0.4ML IJ SOSY
40.0000 mg | PREFILLED_SYRINGE | INTRAMUSCULAR | Status: DC
  Administered 2023-08-05 – 2023-08-06 (×2): 40 mg via SUBCUTANEOUS
  Filled 2023-08-05 (×2): qty 0.4

## 2023-08-05 MED ORDER — MORPHINE SULFATE (PF) 2 MG/ML IV SOLN
1.0000 mg | INTRAVENOUS | Status: DC | PRN
  Administered 2023-08-05: 1 mg via INTRAVENOUS
  Filled 2023-08-05: qty 1

## 2023-08-05 MED ORDER — ALBUTEROL SULFATE (2.5 MG/3ML) 0.083% IN NEBU: 2.5000 mg | INHALATION_SOLUTION | Freq: Four times a day (QID) | RESPIRATORY_TRACT | Status: DC | PRN

## 2023-08-05 MED ORDER — DEXTROSE 50 % IV SOLN: 0.0000 mL | INTRAVENOUS | Status: DC | PRN

## 2023-08-05 MED ORDER — HYDROMORPHONE HCL 1 MG/ML IJ SOLN
0.5000 mg | Freq: Once | INTRAMUSCULAR | Status: AC
Start: 2023-08-05 — End: 2023-08-05
  Administered 2023-08-05: 0.5 mg via INTRAVENOUS
  Filled 2023-08-05: qty 1

## 2023-08-05 MED ORDER — LACTATED RINGERS IV SOLN: INTRAVENOUS | Status: DC

## 2023-08-05 MED ORDER — ACETAMINOPHEN 325 MG PO TABS
650.0000 mg | ORAL_TABLET | Freq: Four times a day (QID) | ORAL | Status: DC | PRN
Start: 2023-08-05 — End: 2023-08-07

## 2023-08-05 MED ORDER — METOCLOPRAMIDE HCL 5 MG/ML IJ SOLN
5.0000 mg | Freq: Once | INTRAMUSCULAR | Status: AC
Start: 2023-08-05 — End: 2023-08-05
  Administered 2023-08-05: 5 mg via INTRAVENOUS
  Filled 2023-08-05: qty 2

## 2023-08-05 MED ORDER — BISACODYL 5 MG PO TBEC: 5.0000 mg | DELAYED_RELEASE_TABLET | Freq: Every day | ORAL | Status: DC | PRN

## 2023-08-05 MED ORDER — LACTATED RINGERS IV BOLUS
20.0000 mL/kg | Freq: Once | INTRAVENOUS | Status: AC
Start: 2023-08-05 — End: 2023-08-05
  Administered 2023-08-05: 1496 mL via INTRAVENOUS

## 2023-08-05 NOTE — ED Notes (Signed)
Patient had vomited all over room and in bed. Went into to help RN clean everything up and get patient clean bedding.

## 2023-08-05 NOTE — ED Notes (Signed)
Pt's CBG result was 222. Informed Heather - RN.

## 2023-08-05 NOTE — ED Provider Notes (Signed)
[place] EMERGENCY DEPARTMENT AT [facility] Provider Note   CSN: [id number] Arrival date & time: 08/05/23  1914     History  Chief Complaint  Patient presents with   Emesis   Hyperglycemia    [name] is a 37 y.o. female.  37 year old female with history of DKA, type 1 diabetes presents with 3 days of emesis.  Patient states that she has had some abdominal cramping as well as nonbilious emesis.  Also notes diffuse myalgias.  Polyuria and polydipsia.  Has not had her insulin the last 3 days.  Symptoms feel similar to her prior DKA       Home Medications Prior to Admission medications   Medication Sig Start Date End Date Taking? Authorizing Provider  Blood Glucose Monitoring Suppl DEVI 1 each by Does not apply route in the morning, at noon, and at bedtime. May substitute based on patient's preference and/or insurance. 04/06/23   Almon Hercules, MD  Continuous Blood Gluc Transmit (DEXCOM G6 TRANSMITTER) MISC Inject 1 each into the skin every 14 (fourteen) days. 12/31/21   Dulce Sellar, NP  cyanocobalamin (VITAMIN B12) 1000 MCG/ML injection Inject 1,000 mcg into the muscle See admin instructions. 1000 mcg injected IM every Tuesday. 08/31/22   [provider]  DULoxetine (CYMBALTA) 60 MG capsule Take 60 mg by mouth daily. 06/28/22   [provider]  insulin aspart (NOVOLOG) 100 UNIT/ML FlexPen Inject 0-18 Units into the skin 3 (three) times daily with meals. Check Blood Glucose (BG) and inject per scale: BG <150= 0 unit; BG 150-200= 3 unit; BG 201-250= 6 unit; BG 251-300= 9 unit; BG 301-350= 12 unit; BG 351-400= 15 unit; BG >400= 18 unit and Call Primary Care. 02/04/23 05/06/23  Dahal, Melina Schools, MD  insulin detemir (LEVEMIR FLEXTOUCH) 100 UNIT/ML FlexPen Inject 15 Units into the skin 2 (two) times daily. Patient taking differently: Inject 60 Units into the skin at bedtime. 02/04/23   Lorin Glass, MD  Insulin Pen Needle (B-D UF III MINI PEN  NEEDLES) 31G X 5 MM MISC To use with Insulin pens, as directed 11/20/21   Dulce Sellar, NP  Insulin Syringes, Disposable, U-100 1 ML MISC Inject 1 each into the skin in the morning and at bedtime. 08/10/21   Dulce Sellar, NP  metoCLOPramide (REGLAN) 10 MG tablet Take 1 tablet (10 mg total) by mouth every 6 (six) hours. 05/10/23   Alvira Monday, MD  ondansetron (ZOFRAN-ODT) 4 MG disintegrating tablet 4mg  ODT q4 hours prn nausea/vomit 07/03/23   Melene Plan, DO  oxyCODONE (ROXICODONE) 15 MG immediate release tablet Take 15 mg by mouth every 6 (six) hours as needed for pain.    [provider]  promethazine (PHENERGAN) 25 MG suppository Place 1 suppository (25 mg total) rectally every 6 (six) hours as needed for nausea or vomiting. 07/03/23   Melene Plan, DO      Allergies    Mirapex [pramipexole]    Review of Systems   Review of Systems  All other systems reviewed and are negative.   Physical Exam Updated Vital Signs BP (!) 187/102   Pulse (!) 118   Temp (!) 97.5 F (36.4 C) (Oral)   Resp 16   Ht 1.676 m (5\' 6" )   Wt 74.8 kg   LMP 07/19/2023 (Approximate)   SpO2 100%   BMI 26.63 kg/m  Physical Exam Vitals and nursing note reviewed.  Constitutional:      General: She is not in acute distress.  Appearance: Normal appearance. She is well-developed. She is not toxic-appearing.  HENT:     Head: Normocephalic and atraumatic.  Eyes:     General: Lids are normal.     Conjunctiva/sclera: Conjunctivae normal.     Pupils: Pupils are equal, round, and reactive to light.  Neck:     Thyroid: No thyroid mass.     Trachea: No tracheal deviation.  Cardiovascular:     Rate and Rhythm: Normal rate and regular rhythm.     Heart sounds: Normal heart sounds. No murmur heard.    No gallop.  Pulmonary:     Effort: Pulmonary effort is normal. No respiratory distress.     Breath sounds: Normal breath sounds. No stridor. No decreased breath sounds, wheezing, rhonchi or rales.   Abdominal:     General: There is no distension.     Palpations: Abdomen is soft.     Tenderness: There is no abdominal tenderness. There is no rebound.  Musculoskeletal:        General: No tenderness. Normal range of motion.     Cervical back: Normal range of motion and neck supple.  Skin:    General: Skin is warm and dry.     Findings: No abrasion or rash.  Neurological:     Mental Status: She is alert and oriented to person, place, and time. Mental status is at baseline.     GCS: GCS eye subscore is 4. GCS verbal subscore is 5. GCS motor subscore is 6.     Cranial Nerves: No cranial nerve deficit.     Sensory: No sensory deficit.     Motor: Motor function is intact.  Psychiatric:        Attention and Perception: Attention normal.        Speech: Speech normal.        Behavior: Behavior normal.     ED Results / Procedures / Treatments   Labs (all labs ordered are listed, but only abnormal results are displayed) Labs Reviewed  CBG MONITORING, ED - Abnormal; Notable for the following components:      Result Value   Glucose-Capillary >600 (*)    All other components within normal limits  CBC WITH DIFFERENTIAL/PLATELET  COMPREHENSIVE METABOLIC PANEL  URINALYSIS, ROUTINE W REFLEX MICROSCOPIC  HCG, SERUM, QUALITATIVE  PREGNANCY, URINE  BETA-HYDROXYBUTYRIC ACID  BETA-HYDROXYBUTYRIC ACID  BETA-HYDROXYBUTYRIC ACID  CBG MONITORING, ED  I-STAT VENOUS BLOOD GAS, ED    EKG None  ED ECG REPORT   Date: 08/05/2023  Rate: 76  Rhythm: normal sinus rhythm  QRS Axis: normal  Intervals: normal  ST/T Wave abnormalities: normal  Conduction Disutrbances:none  Narrative Interpretation:   Old EKG Reviewed: none available  I have personally reviewed the EKG tracing and agree with the computerized printout as noted.  Radiology No results found.  Procedures Procedures    Medications Ordered in ED Medications  sodium chloride 0.9 % bolus 1,496 mL (has no administration in  time range)    ED Course/ Medical Decision Making/ A&P                                 Medical Decision Making Amount and/or Complexity of Data Reviewed Labs: ordered. ECG/medicine tests: ordered.  Risk Prescription drug management.   Patient here with nausea vomiting times several days and not using her insulin.  Concern for possible DKA.  Patient started on IV fluids.  Given antiemetics as  well.  Patient's blood sugar here is 701 with an anion gap of 17.  System early DKA.  Patient started on insulin drip.  She was initially tachycardic without improvement fluids.  I-STAT venous blood gas shows no severe metabolic acidosis.  Plan will be for hospitalization.  Will consult hospitalist team  CRITICAL CARE Performed by: Toy Baker Total critical care time: 55 minutes Critical care time was exclusive of separately billable procedures and treating other patients. Critical care was necessary to treat or prevent imminent or life-threatening deterioration. Critical care was time spent personally by me on the following activities: development of treatment plan with patient and/or surrogate as well as nursing, discussions with consultants, evaluation of patient's response to treatment, examination of patient, obtaining history from patient or surrogate, ordering and performing treatments and interventions, ordering and review of laboratory studies, ordering and review of radiographic studies, pulse oximetry and re-evaluation of patient's condition.         Final Clinical Impression(s) / ED Diagnoses Final diagnoses:  None    Rx / DC Orders ED Discharge Orders     None         Lorre Nick, MD 08/05/23 [phone]

## 2023-08-05 NOTE — ED Notes (Signed)
Greggory Stallion at [facility] called for transport 14:19

## 2023-08-05 NOTE — Progress Notes (Signed)
Plan of Care Note for accepted transfer   Patient: [name] MRN: [id number]   DOA: 08/05/2023  Facility requesting transfer: [facility]. Requesting Provider: Lorre Nick, MD. Reason for transfer: Early DKA. Facility course:   37 y.o. female with medical history significant of chronic constipation, diabetic neuropathy, diabetic gastroparesis, varicella-zoster, noncompliance to medical treatment, type 1 diabetes who presented with nausea, vomiting, abdominal pain with hyperglycemia of 701 mg/dL, CO2 26 mmol/L with an anion gap of 17 consistent with early DKA.  Beta-hydroxybutyric acid [[id number]]   Collected: 08/05/23 0817   Updated: 08/05/23 0823   Specimen Type: Blood   Lipase, blood [[id number]]   Collected: 08/05/23 0817   Updated: 08/05/23 0823   Specimen Type: Blood   I-Stat venous blood gas, ED [[id number]] (Abnormal)   Collected: 08/05/23 0814   Updated: 08/05/23 0816   Specimen Type: Blood    pH, Ven 7.472 High    pCO2, Ven 33.5 Low  mmHg   pO2, Ven 33 mmHg   Bicarbonate 24.6 mmol/L   TCO2 26 mmol/L   O2 Saturation 70 %   Acid-Base Excess 1.0 mmol/L   Sodium 132 Low  mmol/L   Potassium 4.2 mmol/L   Calcium, Ion 1.21 mmol/L   HCT 42.0 %   Hemoglobin 14.3 g/dL   Patient temperature 59.5 F   Sample type VENOUS   Comment VALUES EXPECTED, NO REPEAT  Comprehensive metabolic panel [[id number]] (Abnormal)   Collected: 08/05/23 0701   Updated: 08/05/23 0740   Specimen Type: Blood    Sodium 131 Low  mmol/L   Potassium 4.5 mmol/L   Chloride 88 Low  mmol/L   CO2 26 mmol/L   Glucose, Bld 701 High Panic  mg/dL   BUN 20 mg/dL   Creatinine, Ser 2.95 High  mg/dL   Calcium 18.8 High  mg/dL   Total Protein 8.0 g/dL   Albumin 4.3 g/dL   AST 11 Low  U/L   ALT 9 U/L   Alkaline Phosphatase 83 U/L   Total Bilirubin 0.3 mg/dL   GFR, Estimated >41 mL/min   Anion gap 17 High   hCG, serum, qualitative [[id number]]   Collected: 08/05/23 0701   Updated: 08/05/23 0730    Specimen Type: Blood    Preg, Serum NEGATIVE  CBC with Differential [[id number]] (Abnormal)   Collected: 08/05/23 0701   Updated: 08/05/23 0717   Specimen Type: Blood   Specimen Source: Vein    WBC 10.8 High  K/uL   RBC 4.70 MIL/uL   Hemoglobin 14.3 g/dL   HCT 32.2 %   MCV 02.5 fL   MCH 30.4 pg   MCHC 35.0 g/dL   RDW 42.7 %   Platelets 333 K/uL   nRBC 0.0 %   Neutrophils Relative % 68 %   Neutro Abs 7.3 K/uL   Lymphocytes Relative 26 %   Lymphs Abs 2.8 K/uL   Monocytes Relative 5 %   Monocytes Absolute 0.5 K/uL   Eosinophils Relative 1 %   Eosinophils Absolute 0.1 K/uL   Basophils Relative 0 %   Basophils Absolute 0.0 K/uL   Immature Granulocytes 0 %   Abs Immature Granulocytes 0.03 K/uL  CBG monitoring, ED [[id number]] (Abnormal)   Collected: 08/05/23 0654   Updated: 08/05/23 0657    Glucose-Capillary >600 High Panic  mg/dL    Plan of care: The patient is accepted for admission to [facility] unit, at [facility]..  She was started on IV fluids and insulin infusion.  Author: Ernestene Kiel  Robb Matar, MD 08/05/2023  Check [email or web address] for on-call coverage.  Nursing staff, Please call [facility] Admits & Consults System-Wide number on Amion as soon as patient's arrival, so appropriate admitting provider can evaluate the pt.

## 2023-08-05 NOTE — ED Triage Notes (Signed)
Pt ambulates into the ED today for high blood sugar and vomiting. Pt states she has been vomiting for 3 days and is out of her insulin. Pt has a history of type 1 diabetes.

## 2023-08-05 NOTE — H&P (Addendum)
[facility] History and Physical  [name] IRS:[id number] DOB: March 01, 1986 DOA: 08/05/2023 PCP: Patient, No Pcp Per  Presented from: Home Chief Complaint: Nausea, vomiting, abdominal pain, elevated blood sugar  History of Present Illness: [name] is a 37 y.o. female with PMH significant for type I DM, DKA, diabetic gastroparesis, diabetic neuropathy on chronic narcotics, chronic constipation, VZV. Patient presented to [facility] at [facility] this morning with complaint of 3-day history of progressive high blood sugar, nausea, vomiting, abdominal pain, diffuse myalgia, polyuria, polydipsia.   Patient has been diabetic for more than 15 years and is in process of finding an endocrinologist. She is on Levemir twice daily and Premeal NovoLog 3 times daily.  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  She states that her insulin has been getting because clear and she cannot afford it enough to completely comply with it.  She ran out of her insulin 3 days ago after which your symptoms started.  Denies any symptoms of infection. Symptoms felt similar to prior episodes of DKA. She also states that she has diabetic neuropathy that did not respond to gabapentin or Lyrica in the past and hence on oxycodone 15 mg 3 times a day.  She could not afford to continue it and hence ran out of that as well 3 days ago.  She follows up with the pain management clinic.  In the ED, patient was afebrile, heart rate in 100s, blood pressure 187/102, breathing on room air Initial labs with WC count 10.8, sodium 131, blood glucose level elevated 701, BUN/creatinine 20/1.2, anion gap elevated to 17, calcium elevated to 10.4 VBG with pH 7.47, pCO2 33, beta hydroxybutyric acid 3.68 Serum pregnancy test negative Patient was diagnosed with early DKA, started on insulin drip Accepted to stepdown unit under [facility]  I evaluated the patient after she presented to [facility] this  afternoon. Blood sugar trend since this morning noted.  Last blood sugar level was 178 at 1550  At the time of my evaluation, patient was lying down in bed.  Actively nauseated and trying to throw up.  She has not been able to hold on any liquid or solid despite multiple doses of antiemetics in the ED since this morning. Remains on insulin drip. History reviewed and detailed as above. Family not at bedside. Repeat labs sent   Review of Systems:  All systems were reviewed and were negative unless otherwise mentioned in the HPI   Past medical history: Past Medical History:  Diagnosis Date   Chronic constipation 11/08/2021   Diabetic neuropathy (HCC)    History of chicken pox    History of noncompliance with medical treatment 09/07/2022   Type 1 diabetes (HCC)    Vaginal odor 11/08/2021    Past surgical history: Past Surgical History:  Procedure Laterality Date   CESAREAN SECTION     only one C/S per pt.   CO2 LASER APPLICATION N/A 02/22/2022   Procedure: CO2 LASER APPLICATION TO VULVA;  Surgeon: Carver Fila, MD;  Location: [facility];  Service: General;  Laterality: N/A;   DILATION AND CURETTAGE OF UTERUS     EXCISION OF SKIN TAG N/A 02/22/2022   Procedure: EXCISION OF ANAL SKIN TAGS;  Surgeon: Romie Levee, MD;  Location: [facility] [place];  Service: General;  Laterality: N/A;    Social History:  reports that she has never smoked. She has never been exposed to tobacco smoke. She has never used smokeless tobacco. She  reports that she does not drink alcohol and does not use drugs.  Allergies:  Allergies  Allergen Reactions   Mirapex [Pramipexole] Shortness Of Breath   Mirapex [pramipexole]   Family history:  Family History  Problem Relation Age of Onset   Hypertension Mother    Glaucoma Mother    Diabetes Maternal Grandmother    Neuropathy Neg Hx    Colon cancer Neg Hx    Stomach cancer Neg Hx    Esophageal cancer Neg Hx    Colon  polyps Neg Hx    Rectal cancer Neg Hx      Physical Exam: Vitals:   08/05/23 1445 08/05/23 1500 08/05/23 1545 08/05/23 1637  BP: (!) 133/99  136/86   Pulse: (!) 113  96   Resp:      Temp:    98.4 F (36.9 C)  TempSrc:      SpO2: 100% 100%    Weight:    74.7 kg  Height:    5\' 6"  (1.676 m)   Wt Readings from Last 3 Encounters:  08/05/23 74.7 kg  05/10/23 79.4 kg  05/06/23 78.8 kg   Body mass index is 26.58 kg/m.  General exam: Young African-American female.  In distress because of persistent nausea Skin: No rashes, lesions or ulcers. HEENT: Atraumatic, normocephalic, no obvious bleeding Lungs: Clear to auscultation bilaterally CVS: Regular rhythm, tachycardic, no murmur GI/Abd soft, mild tenderness in epigastrium, nondistended, bowel sound present CNS: Alert, awake, oriented x 3 Psychiatry: Sad affect Extremities: No pedal edema, no calf tenderness   ----------------------------------------------------------------------------------------------------------------------------------------- ----------------------------------------------------------------------------------------------------------------------------------------- -----------------------------------------------------------------------------------------------------------------------------------------  Assessment/Plan: Active Problems:   Nausea & vomiting   Uncontrolled type 1 diabetes mellitus with hyperglycemia (HCC)   DKA (diabetic ketoacidosis) (HCC)  Early DKA Type I diabetic who ran out of insulin 3 days ago and started to have abdominal symptoms, polyuria, polydipsia Blood sugar level significantly elevated over 700, anion gap wide, serum ketone level elevated but acidosis was compensated Started on insulin drip Repeat labs this evening shows improvement in anion gap to 13. Patient is still significantly nauseous and does not think she will be able to tolerate oral intake. As long as the symptoms remain,  I will continue her on insulin drip. I have ordered clear liquid diet just in case he wants to try later. As needed IV Zofran ordered.  Obtain EKG for baseline Qtc  Type 1 diabetes mellitus uncontrolled with hyperglycemia A1c 11.9 from September 2024 PTA meds-Levemir 40 units a.m. and 60 units p.m, NovoLog 0 to 18 units 3 times daily Premeal sliding scale.  However because of the increasing cost, she has not been able to comply with insulin consistently. Currently on insulin drip. Hopefully, her symptoms were improved by tomorrow morning to transition to subcutaneous insulin. Diet care coordinator note from this afternoon reviewed.  I am wondering if she can be transitioned to more affordable insulin regimen. Recent Labs  Lab 08/05/23 1306 08/05/23 1447 08/05/23 1550 08/05/23 1657 08/05/23 1758  GLUCAP 251* 222* 178* 186* 224*   Diabetic neuropathy Patient states that she has diabetic neuropathy that did not respond to gabapentin or Lyrica in the past and hence on oxycodone 15 mg 3 times a day.  She could not afford to continue it and hence ran out of that as well 3 days ago.  She follows up with the pain management clinic. As needed IV morphine and oral oxycodone ordered.  Chronic constipation Secondary to chronic narcotics. Seems to be on Linzess daily.  Resume when  able to tolerate oral intake. Says last BM was yesterday.  Anxiety/depression PTA meds- Cymbalta, Remeron Resume when able to tolerate oral intake  Mobility: Encourage ambulation  Goals of care:   Code Status: Full Code    DVT prophylaxis:  enoxaparin (LOVENOX) injection 40 mg Start: 08/05/23 1800   Antimicrobials: None Fluid: On dextrose drip currently per DKA protocol Consultants: None Family Communication: None at bedside  Dispo: The patient is from: Home              Anticipated d/c is to: Hopefully home in 1 to 2 days  Diet: Diet Order             Diet clear liquid Room service appropriate? Yes;  Fluid consistency: Thin  Diet effective now                    ------------------------------------------------------------------------------------- Severity of Illness: The appropriate patient status for this patient is OBSERVATION. Observation status is judged to be reasonable and necessary in order to provide the required intensity of service to ensure the patient's safety. The patient's presenting symptoms, physical exam findings, and initial radiographic and laboratory data in the context of their medical condition is felt to place them at decreased risk for further clinical deterioration. Furthermore, it is anticipated that the patient will be medically stable for discharge from the hospital within 2 midnights of admission.  ------------------------------------------------------------------------------------- Recon  Abx  Home Meds: Prior to Admission medications   Medication Sig Start Date End Date Taking? Authorizing Provider  Continuous Glucose Sensor (DEXCOM G7 SENSOR) MISC  07/25/23  Yes [provider]  DULoxetine (CYMBALTA) 60 MG capsule Take 60 mg by mouth daily. 06/28/22  Yes [provider]  LINZESS 145 MCG CAPS capsule Take 145 mcg by mouth daily. 05/01/23  Yes [provider]  mirtazapine (REMERON) 15 MG tablet Take 15 mg by mouth at bedtime. 06/08/23  Yes [provider]  Blood Glucose Monitoring Suppl DEVI 1 each by Does not apply route in the morning, at noon, and at bedtime. May substitute based on patient's preference and/or insurance. 04/06/23   Almon Hercules, MD  Continuous Blood Gluc Transmit (DEXCOM G6 TRANSMITTER) MISC Inject 1 each into the skin every 14 (fourteen) days. 12/31/21   Dulce Sellar, NP  cyanocobalamin (VITAMIN B12) 1000 MCG/ML injection Inject 1,000 mcg into the muscle See admin instructions. 1000 mcg injected IM every Tuesday. 08/31/22   [provider]  insulin aspart (NOVOLOG) 100 UNIT/ML FlexPen  Inject 0-18 Units into the skin 3 (three) times daily with meals. Check Blood Glucose (BG) and inject per scale: BG <150= 0 unit; BG 150-200= 3 unit; BG 201-250= 6 unit; BG 251-300= 9 unit; BG 301-350= 12 unit; BG 351-400= 15 unit; BG >400= 18 unit and Call Primary Care. 02/04/23 05/06/23  Juelle Dickmann, Melina Schools, MD  insulin detemir (LEVEMIR FLEXTOUCH) 100 UNIT/ML FlexPen Inject 15 Units into the skin 2 (two) times daily. Patient taking differently: Inject 60 Units into the skin at bedtime. 02/04/23   Lorin Glass, MD  Insulin Pen Needle (B-D UF III MINI PEN NEEDLES) 31G X 5 MM MISC To use with Insulin pens, as directed 11/20/21   Dulce Sellar, NP  Insulin Syringes, Disposable, U-100 1 ML MISC Inject 1 each into the skin in the morning and at bedtime. 08/10/21   Dulce Sellar, NP  metoCLOPramide (REGLAN) 10 MG tablet Take 1 tablet (10 mg total) by mouth every 6 (six) hours. 05/10/23   Alvira Monday,  MD  ondansetron (ZOFRAN-ODT) 4 MG disintegrating tablet 4mg  ODT q4 hours prn nausea/vomit 07/03/23   Melene Plan, DO  oxyCODONE (ROXICODONE) 15 MG immediate release tablet Take 15 mg by mouth every 6 (six) hours as needed for pain.    [provider]  promethazine (PHENERGAN) 25 MG suppository Place 1 suppository (25 mg total) rectally every 6 (six) hours as needed for nausea or vomiting. 07/03/23   Melene Plan, DO    Labs on Admission:   CBC: Recent Labs  Lab 08/05/23 0701 08/05/23 0814 08/05/23 1652  WBC 10.8*  --  11.9*  NEUTROABS 7.3  --   --   HGB 14.3 14.3 13.5  HCT 40.8 42.0 39.0  MCV 86.8  --  90.1  PLT 333  --  284    Basic Metabolic Panel: Recent Labs  Lab 08/05/23 0701 08/05/23 0814 08/05/23 1652  NA 131* 132* 138  K 4.5 4.2 4.0  CL 88*  --  102  CO2 26  --  23  GLUCOSE 701*  --  212*  BUN 20  --  17  CREATININE 1.20*  --  0.69  CALCIUM 10.4*  --  9.9    Liver Function Tests: Recent Labs  Lab 08/05/23 0701  AST 11*  ALT 9  ALKPHOS 83  BILITOT 0.3  PROT  8.0  ALBUMIN 4.3   Recent Labs  Lab 08/05/23 0817  LIPASE 14   No results for input(s): "AMMONIA" in the last 168 hours.  Cardiac Enzymes: No results for input(s): "CKTOTAL", "CKMB", "CKMBINDEX", "TROPONINI" in the last 168 hours.  BNP (last 3 results) Recent Labs    04/02/23 0205  BNP 11.2    ProBNP (last 3 results) No results for input(s): "PROBNP" in the last 8760 hours.  CBG: Recent Labs  Lab 08/05/23 1306 08/05/23 1447 08/05/23 1550 08/05/23 1657 08/05/23 1758  GLUCAP 251* 222* 178* 186* 224*    Lipase     Component Value Date/Time   LIPASE 14 08/05/2023 0817     Urinalysis    Component Value Date/Time   COLORURINE STRAW (A) 05/13/2023 1104   APPEARANCEUR CLEAR 05/13/2023 1104   LABSPEC 1.019 05/13/2023 1104   PHURINE 8.0 05/13/2023 1104   GLUCOSEU NEGATIVE 05/13/2023 1104   HGBUR NEGATIVE 05/13/2023 1104   BILIRUBINUR NEGATIVE 05/13/2023 1104   BILIRUBINUR negative 11/08/2021 1424   KETONESUR NEGATIVE 05/13/2023 1104   PROTEINUR NEGATIVE 05/13/2023 1104   UROBILINOGEN 0.2 11/08/2021 1424   UROBILINOGEN 0.2 05/03/2021 0837   NITRITE NEGATIVE 05/13/2023 1104   LEUKOCYTESUR NEGATIVE 05/13/2023 1104     Drugs of Abuse     Component Value Date/Time   LABOPIA NONE DETECTED 05/13/2023 1104   COCAINSCRNUR NONE DETECTED 05/13/2023 1104   LABBENZ NONE DETECTED 05/13/2023 1104   AMPHETMU NONE DETECTED 05/13/2023 1104   THCU POSITIVE (A) 05/13/2023 1104   LABBARB NONE DETECTED 05/13/2023 1104      Radiological Exams on Admission: No results found.   Signed, Lorin Glass, MD [facility] 08/05/2023

## 2023-08-05 NOTE — ED Notes (Signed)
This RN notified by tech regarding blood sugar. Insulin drip titrated accordingly.

## 2023-08-05 NOTE — Inpatient Diabetes Management (Signed)
Inpatient Diabetes Program Recommendations  AACE/ADA: New Consensus Statement on Inpatient Glycemic Control (2015)  Target Ranges:  Prepandial:   less than 140 mg/dL      Peak postprandial:   less than 180 mg/dL (1-2 hours)      Critically ill patients:  140 - 180 mg/dL   Lab Results  Component Value Date   GLUCAP 251 (H) 08/05/2023   HGBA1C 11.9 (H) 05/06/2023    Review of Glycemic Control  Latest Reference Range & Units 08/05/23 06:54 08/05/23 09:27 08/05/23 10:23 08/05/23 11:40 08/05/23 13:06  Glucose-Capillary 70 - 99 mg/dL >914 (HH) 782 (HH) 956 (H) 369 (H) 251 (H)  (HH): Data is critically high (H): Data is abnormally high Diabetes history: Type 1 DM Outpatient Diabetes medications: Levemir 60 units at bedtime, Novolog 0-18 units TID Current orders for Inpatient glycemic control: IV insulin  Inpatient Diabetes Program Recommendations:    Continue with Iv insulin.   Of note, consider adding BMETs Q4H and beta hydroxybutyric acid Q8H.   When ready to transition: -consider Semglee 35 units two hours prior to discontinuation then every day to follow -Novolog 0-9 units TID  -Novolog 3 units TID  Attempted to reach patient, no answer. Secure chat sent to RN to help assist. Will plan to have DM team to follow up.  TOC consult placed.   Thanks, Lujean Rave, MSN, RNC-OB Diabetes Coordinator [phone] (8a-5p)

## 2023-08-06 ENCOUNTER — Telehealth (HOSPITAL_COMMUNITY): Payer: Self-pay | Admitting: Pharmacy Technician

## 2023-08-06 ENCOUNTER — Other Ambulatory Visit (HOSPITAL_COMMUNITY): Payer: Self-pay

## 2023-08-06 DIAGNOSIS — K5900 Constipation, unspecified: Secondary | ICD-10-CM | POA: Diagnosis present

## 2023-08-06 DIAGNOSIS — K5909 Other constipation: Secondary | ICD-10-CM | POA: Diagnosis present

## 2023-08-06 DIAGNOSIS — Z888 Allergy status to other drugs, medicaments and biological substances status: Secondary | ICD-10-CM | POA: Diagnosis not present

## 2023-08-06 DIAGNOSIS — E1010 Type 1 diabetes mellitus with ketoacidosis without coma: Secondary | ICD-10-CM | POA: Diagnosis present

## 2023-08-06 DIAGNOSIS — E860 Dehydration: Secondary | ICD-10-CM

## 2023-08-06 DIAGNOSIS — E1042 Type 1 diabetes mellitus with diabetic polyneuropathy: Secondary | ICD-10-CM | POA: Diagnosis present

## 2023-08-06 DIAGNOSIS — N179 Acute kidney failure, unspecified: Secondary | ICD-10-CM | POA: Diagnosis present

## 2023-08-06 DIAGNOSIS — F32A Depression, unspecified: Secondary | ICD-10-CM | POA: Diagnosis present

## 2023-08-06 DIAGNOSIS — F419 Anxiety disorder, unspecified: Secondary | ICD-10-CM | POA: Diagnosis present

## 2023-08-06 DIAGNOSIS — Z83511 Family history of glaucoma: Secondary | ICD-10-CM | POA: Diagnosis not present

## 2023-08-06 DIAGNOSIS — Z794 Long term (current) use of insulin: Secondary | ICD-10-CM | POA: Diagnosis not present

## 2023-08-06 DIAGNOSIS — K3184 Gastroparesis: Secondary | ICD-10-CM | POA: Diagnosis present

## 2023-08-06 DIAGNOSIS — E1110 Type 2 diabetes mellitus with ketoacidosis without coma: Secondary | ICD-10-CM | POA: Diagnosis present

## 2023-08-06 DIAGNOSIS — Z8249 Family history of ischemic heart disease and other diseases of the circulatory system: Secondary | ICD-10-CM | POA: Diagnosis not present

## 2023-08-06 DIAGNOSIS — Z833 Family history of diabetes mellitus: Secondary | ICD-10-CM | POA: Diagnosis not present

## 2023-08-06 DIAGNOSIS — Z91141 Patient's other noncompliance with medication regimen due to financial hardship: Secondary | ICD-10-CM | POA: Diagnosis not present

## 2023-08-06 DIAGNOSIS — E876 Hypokalemia: Secondary | ICD-10-CM

## 2023-08-06 DIAGNOSIS — Z79891 Long term (current) use of opiate analgesic: Secondary | ICD-10-CM | POA: Diagnosis not present

## 2023-08-06 DIAGNOSIS — T40605A Adverse effect of unspecified narcotics, initial encounter: Secondary | ICD-10-CM | POA: Diagnosis present

## 2023-08-06 DIAGNOSIS — Z79899 Other long term (current) drug therapy: Secondary | ICD-10-CM | POA: Diagnosis not present

## 2023-08-06 DIAGNOSIS — E1043 Type 1 diabetes mellitus with diabetic autonomic (poly)neuropathy: Secondary | ICD-10-CM | POA: Diagnosis present

## 2023-08-06 LAB — BASIC METABOLIC PANEL
Anion gap: 10 (ref 5–15)
BUN: 13 mg/dL (ref 6–20)
CO2: 24 mmol/L (ref 22–32)
Calcium: 8.9 mg/dL (ref 8.9–10.3)
Chloride: 101 mmol/L (ref 98–111)
Creatinine, Ser: 0.61 mg/dL (ref 0.44–1.00)
GFR, Estimated: >60 mL/min (ref >60)
Glucose, Bld: 163 mg/dL — ABNORMAL HIGH (ref 70–99)
Potassium: 3.1 mmol/L — ABNORMAL LOW (ref 3.5–5.1)
Sodium: 135 mmol/L (ref 135–145)

## 2023-08-06 LAB — GLUCOSE, CAPILLARY
Glucose-Capillary: 139 mg/dL — ABNORMAL HIGH (ref 70–99)
Glucose-Capillary: 146 mg/dL — ABNORMAL HIGH (ref 70–99)
Glucose-Capillary: 161 mg/dL — ABNORMAL HIGH (ref 70–99)
Glucose-Capillary: 164 mg/dL — ABNORMAL HIGH (ref 70–99)
Glucose-Capillary: 168 mg/dL — ABNORMAL HIGH (ref 70–99)
Glucose-Capillary: 173 mg/dL — ABNORMAL HIGH (ref 70–99)
Glucose-Capillary: 185 mg/dL — ABNORMAL HIGH (ref 70–99)
Glucose-Capillary: 214 mg/dL — ABNORMAL HIGH (ref 70–99)
Glucose-Capillary: 245 mg/dL — ABNORMAL HIGH (ref 70–99)
Glucose-Capillary: 258 mg/dL — ABNORMAL HIGH (ref 70–99)

## 2023-08-06 LAB — MAGNESIUM: Magnesium: 1.9 mg/dL (ref 1.7–2.4)

## 2023-08-06 LAB — CBC
HCT: 36.9 % (ref 36.0–46.0)
Hemoglobin: 12.4 g/dL (ref 12.0–15.0)
MCH: 30.9 pg (ref 26.0–34.0)
MCHC: 33.6 g/dL (ref 30.0–36.0)
MCV: 92 fL (ref 80.0–100.0)
Platelets: 277 10*3/uL (ref 150–400)
RBC: 4.01 MIL/uL (ref 3.87–5.11)
RDW: 13 % (ref 11.5–15.5)
WBC: 13.8 10*3/uL — ABNORMAL HIGH (ref 4.0–10.5)
nRBC: 0 % (ref 0.0–0.2)

## 2023-08-06 LAB — PHOSPHORUS: Phosphorus: 2.3 mg/dL — ABNORMAL LOW (ref 2.5–4.6)

## 2023-08-06 MED ORDER — INSULIN ASPART 100 UNIT/ML IJ SOLN
0.0000 [IU] | Freq: Three times a day (TID) | INTRAMUSCULAR | Status: DC
  Administered 2023-08-06: 5 [IU] via SUBCUTANEOUS
  Administered 2023-08-06: 8 [IU] via SUBCUTANEOUS
  Administered 2023-08-07: 2 [IU] via SUBCUTANEOUS

## 2023-08-06 MED ORDER — INSULIN ASPART 100 UNIT/ML IJ SOLN: 0.0000 [IU] | Freq: Every day | INTRAMUSCULAR | Status: DC

## 2023-08-06 MED ORDER — DULOXETINE HCL 60 MG PO CPEP
60.0000 mg | ORAL_CAPSULE | Freq: Every day | ORAL | Status: DC
  Administered 2023-08-06 – 2023-08-07 (×2): 60 mg via ORAL
  Filled 2023-08-06: qty 2
  Filled 2023-08-06: qty 1

## 2023-08-06 MED ORDER — PROCHLORPERAZINE EDISYLATE 10 MG/2ML IJ SOLN
5.0000 mg | Freq: Once | INTRAMUSCULAR | Status: AC
  Administered 2023-08-06: 5 mg via INTRAVENOUS
  Filled 2023-08-06: qty 2

## 2023-08-06 MED ORDER — INSULIN GLARGINE-YFGN 100 UNIT/ML ~~LOC~~ SOLN
30.0000 [IU] | Freq: Two times a day (BID) | SUBCUTANEOUS | Status: DC
Start: 2023-08-06 — End: 2023-08-07
  Administered 2023-08-06 – 2023-08-07 (×3): 30 [IU] via SUBCUTANEOUS
  Filled 2023-08-06 (×4): qty 0.3

## 2023-08-06 MED ORDER — POTASSIUM PHOSPHATES 15 MMOLE/5ML IV SOLN
30.0000 mmol | Freq: Once | INTRAVENOUS | Status: AC
  Administered 2023-08-06: 30 mmol via INTRAVENOUS
  Filled 2023-08-06: qty 10

## 2023-08-06 MED ORDER — INSULIN ASPART 100 UNIT/ML IJ SOLN
0.0000 [IU] | INTRAMUSCULAR | Status: DC
  Administered 2023-08-06 (×2): 2 [IU] via SUBCUTANEOUS

## 2023-08-06 MED ORDER — SODIUM CHLORIDE 0.9 % IV SOLN: 250.0000 mL | INTRAVENOUS | Status: AC | PRN

## 2023-08-06 MED ORDER — INSULIN GLARGINE-YFGN 100 UNIT/ML ~~LOC~~ SOLN
30.0000 [IU] | Freq: Every day | SUBCUTANEOUS | Status: DC
  Filled 2023-08-06: qty 0.3

## 2023-08-06 MED ORDER — POTASSIUM CHLORIDE 2 MEQ/ML IV SOLN
INTRAVENOUS | Status: AC
  Filled 2023-08-06 (×2): qty 1000

## 2023-08-06 MED ORDER — SODIUM CHLORIDE 0.9% FLUSH: 3.0000 mL | INTRAVENOUS | Status: DC | PRN

## 2023-08-06 MED ORDER — SODIUM CHLORIDE 0.9% FLUSH
3.0000 mL | Freq: Two times a day (BID) | INTRAVENOUS | Status: DC
  Administered 2023-08-06 – 2023-08-07 (×3): 3 mL via INTRAVENOUS

## 2023-08-06 NOTE — Inpatient Diabetes Management (Addendum)
Inpatient Diabetes Program Recommendations  AACE/ADA: New Consensus Statement on Inpatient Glycemic Control (2015)  Target Ranges:  Prepandial:   less than 140 mg/dL      Peak postprandial:   less than 180 mg/dL (1-2 hours)      Critically ill patients:  140 - 180 mg/dL    Latest Reference Range & Units 09/07/22 17:23 12/30/22 04:49 05/06/23 08:32  Hemoglobin A1C 4.8 - 5.6 % 10.9 (H) 12.2 (H) 11.9 (H)  295 mg/dl  (H): Data is abnormally high  Latest Reference Range & Units 08/05/23 07:01  Glucose 70 - 99 mg/dL 161 (HH)  (HH): Data is critically high  Latest Reference Range & Units 08/05/23 06:54 08/05/23 09:27 08/05/23 10:23 08/05/23 11:40 08/05/23 13:06 08/05/23 14:47 08/05/23 15:50 08/05/23 16:57  Glucose-Capillary 70 - 99 mg/dL >096 (HH)  IV Insulin Drip Started 0830 543 (HH) 427 (H) 369 (H) 251 (H) 222 (H) 178 (H) 186 (H)  (HH): Data is critically high (H): Data is abnormally high  Latest Reference Range & Units 08/05/23 17:58 08/05/23 19:00 08/05/23 20:02 08/05/23 20:59 08/05/23 22:01 08/05/23 23:06 08/06/23 00:09 08/06/23 01:02  Glucose-Capillary 70 - 99 mg/dL 045 (H) 409 (H) 811 (H) 210 (H) 174 (H) 162 (H) 173 (H) 214 (H)  IV Insulin Drip off at 0207  (H): Data is abnormally high  Admit with: Early DKA--Ran out of Insulin 3 days ago  History: Type 1 diabetes, Gastroparesis  Home DM Meds: Levemir 60 units PM       Novolog 0-18 units TID       Dexcom G6 CGM  Current Orders: Novolog Moderate Correction Scale/ SSI (0-15 units) Q4 hours    Pt well known to the Inpatient Diabetes team--Multiple admissions--Multiple ED visits Counseled by the Diabetes Coordinator August and September 2024  Transitioned off the IV Insulin Drip but did not receive any basal insulin prior to stopping IV Insulin Drip   MD- Please consider:  1. Start Semglee 30 units Daily this AM (50% home dose) Please start this AM since pt did not get any basal insulin when she came off the IV Insulin  Drip   2. Reduce the Novolog SSI to the 0-9 unit Sensitive scale  Have sent request to [facility] to see which insulins are best covered on her insurance plan     Addendum 11am--Met w/ pt at bedside.  She confirmed she ran out of insulin PTA.  Told me she can handle the cost of $5 for Lantus insulin and can get Dexcom Sensors for $5 as well.  The cost of the Novolog at $67 is too much for her.  Told me she called her insurance company and they told her if the MD orders the Novolog as a 90 day supply she can get the Novolog for a cheaper price.  I sent [place] to Dr. Waymon Amato letting him know this.  Not sure how to order 90 day supply?  [facility] stated this is likely mail order as retail can only provide 30 day supply.    --Will follow patient during hospitalization--  Ambrose Finland RN, MSN, CDCES Diabetes Coordinator Inpatient Glycemic Control Team Team Pager: [phone] (8a-5p)

## 2023-08-06 NOTE — Progress Notes (Addendum)
PROGRESS NOTE   [name]  ZOX:[id number]    DOB: [birth year]    DOA: 08/05/2023  PCP: Patient, No Pcp Per   I have briefly reviewed patients previous medical records in [facility].  Chief Complaint  Patient presents with   Emesis   Hyperglycemia    Brief Hospital Course:  37 year old married female with PMH of type I DM with diabetic gastroparesis, neuropathy on chronic opioids, DKA, chronic constipation, VZV, runs out of insulins prematurely due to financial constraints despite having insurance, ran out of insulin 3 days ago coinciding with onset of high blood sugars, nausea, vomiting, abdominal pain, diffuse myalgia, polyuria and polydipsia.  Admitted to stepdown unit for marked hyperglycemia and early DKA.  Treated per DKA protocol with aggressive IV fluids, insulin drip, frequent BMP monitoring.  AG closed, transitioning to subcutaneous insulins.  DM coordinator consulted.   Assessment & Plan:  Active Problems:   Nausea & vomiting   Uncontrolled type 1 diabetes mellitus with hyperglycemia (HCC)   DKA (diabetic ketoacidosis) (HCC)   Type I DM complicated by diabetic gastroparesis and diabetic peripheral neuropathy, admitted with early DKA due to Insulin noncompliance from financial constraints: A1c 11.9 on 9/17 indicates poor control Presented with following labs: VBG: pH 7.47.  Sodium 131, glucose 701, creatinine 1.2, calcium 10.4, anion gap 17, bicarbonate 26, BHB 3.68. Admitted to stepdown and treated per DKA protocol with aggressive IV fluids, insulin drip, frequent BMP monitoring. DKA resolved.  Unfortunately patient was started only on NovoLog SSI.  Initiated Semglee 30 units twice daily (at home takes Levemir 40 in the morning, 60 in the evening with last dose being 12/15) As per communication with pharmacy, Levemir almost discontinued in the [place].  Lantus 30 Dieulafoy co-pays $5 and NovoLog $65 Monitor closely and adjust insulins as needed. Advance diet  and monitor for tolerance. PCP trying to arrange for outpatient endocrinology consultation Per admitting MDs note, neuropathy symptoms not controlled on gabapentin or Lyrica and hence on opioids PTA which also she ran out.  She will need to follow-up with her PCP for refills  Acute kidney injury Presented with creatinine of 1.2 which has normalized to 0.6. Secondary to dehydration and DKA.  Sinus tachycardia:  Likely related to DKA and may be still volume depleted. Continue IV fluids for additional 24 hours Oral intake as tolerated.  Hypokalemia Replace and follow.  Magnesium 1.9  Hypophosphatemia Replace and follow.  Constipation, chronic Secondary to chronic narcotics Not taking Linzess. Last BM 12/17  Anxiety and depression Continue prior home meds of Cymbalta.  Not taking Remeron.   Body mass index is 26.58 kg/m.   DVT prophylaxis: enoxaparin (LOVENOX) injection 40 mg Start: 08/05/23 1800     Code Status: Full Code:  Family Communication: None at bedside Disposition:  Status is: Observation The patient will require care spanning > 2 midnights and should be moved to inpatient because: IV fluids due to suspected volume depletion and continued sinus tachycardia.     Consultants:     Procedures:     Antimicrobials:      Subjective:  Seen this morning in stepdown unit.  Patient's RN at bedside.  Reports feeling better.  Abdominal cramps.  Last BM 12/17.  No nausea or vomiting overnight.  Last episode of vomiting was on admission day.  Hungry.  Tolerated some clear liquids.  Objective:   Vitals:   08/06/23 0200 08/06/23 0300 08/06/23 0400 08/06/23 0700  BP: 120/71 128/77  (!) 129/43  Pulse: (!) 106 (!) 108  (!) 110  Resp: 11 12  (!) 49  Temp:   98.5 F (36.9 C)   TempSrc:   Axillary   SpO2: 98% 99%  100%  Weight:      Height:        General exam: Young female, moderately built and nourished lying comfortably propped up in bed without distress.  Oral  mucosa with borderline hydration. Respiratory system: Clear to auscultation. Respiratory effort normal. Cardiovascular system: S1 & S2 heard, regular tachycardia. No JVD, murmurs, rubs, gallops or clicks. No pedal edema.  Telemetry personally reviewed: Sinus tachycardia in the 100s-110s.  Transferring to telemetry bed from stepdown. Gastrointestinal system: Abdomen is nondistended, soft and diffuse mild tenderness without peritoneal signs. No organomegaly or masses felt. Normal bowel sounds heard. Central nervous system: Alert and oriented. No focal neurological deficits. Extremities: Symmetric 5 x 5 power. Skin: No rashes, lesions or ulcers Psychiatry: Judgement and insight appear normal. Mood & affect appropriate.     Data Reviewed:   I have personally reviewed following labs and imaging studies   CBC: Recent Labs  Lab 08/05/23 0701 08/05/23 0814 08/05/23 1652 08/06/23 0318  WBC 10.8*  --  11.9* 13.8*  NEUTROABS 7.3  --   --   --   HGB 14.3 14.3 13.5 12.4  HCT 40.8 42.0 39.0 36.9  MCV 86.8  --  90.1 92.0  PLT 333  --  284 277    Basic Metabolic Panel: Recent Labs  Lab 08/05/23 0701 08/05/23 0814 08/05/23 1652 08/06/23 0318  NA 131* 132* 138 135  K 4.5 4.2 4.0 3.1*  CL 88*  --  102 101  CO2 26  --  23 24  GLUCOSE 701*  --  212* 163*  BUN 20  --  17 13  CREATININE 1.20*  --  0.69 0.61  CALCIUM 10.4*  --  9.9 8.9  MG  --   --   --  1.9  PHOS  --   --   --  2.3*    Liver Function Tests: Recent Labs  Lab 08/05/23 0701  AST 11*  ALT 9  ALKPHOS 83  BILITOT 0.3  PROT 8.0  ALBUMIN 4.3    CBG: Recent Labs  Lab 08/06/23 0304 08/06/23 0401 08/06/23 0737  GLUCAP 164* 139* 146*    Microbiology Studies:   Recent Results (from the past 240 hours)  MRSA Next Gen by PCR, Nasal     Status: Abnormal   Collection Time: 08/05/23  4:49 PM   Specimen: Nasal Mucosa; Nasal Swab  Result Value Ref Range Status   MRSA by PCR Next Gen DETECTED (A) NOT DETECTED Final     Comment: (NOTE) The GeneXpert MRSA Assay (FDA approved for NASAL specimens only), is one component of a comprehensive MRSA colonization surveillance program. It is not intended to diagnose MRSA infection nor to guide or monitor treatment for MRSA infections. Test performance is not FDA approved in patients less than 12 years old. Performed at [facility], [address], [place], [place] [address]     Radiology Studies:  No results found.  Scheduled Meds:    Chlorhexidine Gluconate Cloth  6 each Topical Daily   enoxaparin (LOVENOX) injection  40 mg Subcutaneous Q24H   insulin aspart  0-15 Units Subcutaneous TID WC   insulin aspart  0-5 Units Subcutaneous QHS   insulin glargine-yfgn  30 Units Subcutaneous Daily   mupirocin ointment  1 Application Nasal  BID   sodium chloride flush  3 mL Intravenous Q12H    Continuous Infusions:    sodium chloride     potassium PHOSPHATE IVPB (in mmol) 30 mmol (08/06/23 0752)     LOS: 0 days     Marcellus Scott, MD,  FACP, [facility], [facility], [facility]   [facility] Ad[facility] [place]      To contact the attending provider between 7A-7P or the covering provider during after hours 7P-7A, please log into the web site [email or web address] and access using universal [place] password for that web site. If you do not have the password, please call the hospital operator.  08/06/2023, 8:11 AM

## 2023-08-06 NOTE — Progress Notes (Signed)
       Overnight   NAME: [name] MRN: [id number] DOB : [birth year]    Date of Service   08/06/2023   HPI/Events of Note    Notified by RN for consideration of nausea.  EKG completed due to alert for QTc prolongation alert from past chart  QT/QTcB 316/433 Patient reports no relief with earlier Zofran    Latest Reference Range & Units 08/05/23 16:52  Sodium 135 - 145 mmol/L 138  Potassium 3.5 - 5.1 mmol/L 4.0  Chloride 98 - 111 mmol/L 102  CO2 22 - 32 mmol/L 23  Glucose 70 - 99 mg/dL 086 (H)  BUN 6 - 20 mg/dL 17  Creatinine 5.78 - 4.69 mg/dL 6.29  Calcium 8.9 - 52.8 mg/dL 9.9  Anion gap 5 - 15  13  (H): Data is abnormally high     Interventions/ Plan   Compazine ordered EKG avail on chart       Chinita Greenland BSN MSNA MSN ACNPC-AG Acute Care Nurse Practitioner [facility]

## 2023-08-06 NOTE — Telephone Encounter (Signed)
Patient Product/process development scientist completed.    The patient is insured through [facility]. Patient has [employer], may use a copay card, and/or apply for patient assistance if available.    Ran test claim for Lantus Pen and the current 30 day co-pay is $5.00.  Ran test claim for Novolog FlexPen and the current 30 day co-pay is $67.11.  This test claim was processed through [facility]- copay amounts may vary at other pharmacies due to pharmacy/plan contracts, or as the patient moves through the different stages of their insurance plan.     [name], CPHT Pharmacy Technician III Certified Patient Advocate [facility] Patient Advocate Team Direct Number: [phone]  Fax: [phone]

## 2023-08-06 NOTE — Plan of Care (Signed)
  Problem: Metabolic: Goal: Ability to maintain appropriate glucose levels will improve Outcome: Progressing   

## 2023-08-07 DIAGNOSIS — E1010 Type 1 diabetes mellitus with ketoacidosis without coma: Secondary | ICD-10-CM | POA: Diagnosis not present

## 2023-08-07 LAB — CBC
HCT: 37.6 % (ref 36.0–46.0)
Hemoglobin: 12.5 g/dL (ref 12.0–15.0)
MCH: 31 pg (ref 26.0–34.0)
MCHC: 33.2 g/dL (ref 30.0–36.0)
MCV: 93.3 fL (ref 80.0–100.0)
Platelets: 252 10*3/uL (ref 150–400)
RBC: 4.03 MIL/uL (ref 3.87–5.11)
RDW: 13.1 % (ref 11.5–15.5)
WBC: 7.9 10*3/uL (ref 4.0–10.5)
nRBC: 0 % (ref 0.0–0.2)

## 2023-08-07 LAB — BASIC METABOLIC PANEL
Anion gap: 7 (ref 5–15)
BUN: 9 mg/dL (ref 6–20)
CO2: 26 mmol/L (ref 22–32)
Calcium: 8.6 mg/dL — ABNORMAL LOW (ref 8.9–10.3)
Chloride: 104 mmol/L (ref 98–111)
Creatinine, Ser: 0.48 mg/dL (ref 0.44–1.00)
GFR, Estimated: >60 mL/min (ref >60)
Glucose, Bld: 130 mg/dL — ABNORMAL HIGH (ref 70–99)
Potassium: 3.6 mmol/L (ref 3.5–5.1)
Sodium: 137 mmol/L (ref 135–145)

## 2023-08-07 LAB — GLUCOSE, CAPILLARY
Glucose-Capillary: 117 mg/dL — ABNORMAL HIGH (ref 70–99)
Glucose-Capillary: 149 mg/dL — ABNORMAL HIGH (ref 70–99)
Glucose-Capillary: 195 mg/dL — ABNORMAL HIGH (ref 70–99)

## 2023-08-07 MED ORDER — INSULIN ASPART 100 UNIT/ML FLEXPEN: 4.0000 [IU] | PEN_INJECTOR | Freq: Three times a day (TID) | SUBCUTANEOUS | 0 refills | Status: DC

## 2023-08-07 MED ORDER — INSULIN ASPART 100 UNIT/ML FLEXPEN: 0.0000 [IU] | PEN_INJECTOR | Freq: Three times a day (TID) | SUBCUTANEOUS | 2 refills | Status: AC

## 2023-08-07 MED ORDER — INSULIN GLARGINE 100 UNIT/ML SOLOSTAR PEN: 30.0000 [IU] | PEN_INJECTOR | Freq: Two times a day (BID) | SUBCUTANEOUS | 2 refills | Status: DC

## 2023-08-07 MED ORDER — INSULIN ASPART 100 UNIT/ML IJ SOLN: 0.0000 [IU] | Freq: Three times a day (TID) | INTRAMUSCULAR | Status: DC

## 2023-08-07 MED ORDER — BD PEN NEEDLE MINI U/F 31G X 5 MM MISC: 3 refills | Status: DC

## 2023-08-07 MED ORDER — INSULIN ASPART 100 UNIT/ML IJ SOLN: 0.0000 [IU] | Freq: Every day | INTRAMUSCULAR | Status: DC

## 2023-08-07 NOTE — Plan of Care (Signed)

## 2023-08-07 NOTE — Discharge Instructions (Signed)

## 2023-08-07 NOTE — TOC Initial Note (Signed)
Transition of Care [facility]) - Initial/Assessment Note    Patient Details  Name: [name] MRN: [id number] Date of Birth: 08/28/1985  [facility] CM/SW Contact:    Beckie Busing, RN Phone Number: 08/07/2023, 12:09 PM  Clinical Narrative:                 TOC acknowledges patient from home with high risk for readmission. Patient is from home where she lives alone and functions independently. TOC consulted for PCP needs. Patient states that she has PCP Bill Sal[facility] at [facility]. TOC has also been consulted for medication assistance. Patient has medical coverage that covers her medications but states that copay for insulin is to expensive at $67/month. CM has explained to patient that TOC does not have a resource that will cover copay. Patient verbalized understanding. CM will update MD. There are currently no other TOC needs. TOC will sign off.   Expected Discharge Plan: Home/Self Care     Patient Goals and CMS Choice Patient states their goals for this hospitalization and ongoing recovery are:: Wants to go home CMS Medicare.gov Compare Post Acute Care list provided to::  (n/a) Choice offered to / list presented to : NA [place] ownership interest in [facility].provided to::  (n/a)    Expected Discharge Plan and Services In-house Referral: NA Discharge Planning Services: CM Consult   Living arrangements for the past 2 months: Single Family Home                 DME Arranged: N/A DME Agency: NA       HH Arranged: NA HH Agency: NA        Prior Living Arrangements/Services Living arrangements for the past 2 months: Single Family Home Lives with:: Self Patient language and need for interpreter reviewed:: Yes Do you feel safe going back to the place where you live?: Yes      Need for Family Participation in Patient Care: No (Comment) Care giver support system in place?: Yes (comment) Current home services:  (n/a) Criminal  Activity/Legal Involvement Pertinent to Current Situation/Hospitalization: No - Comment as needed  Activities of Daily Living   ADL Screening (condition at time of admission) Independently performs ADLs?: Yes (appropriate for developmental age) Is the patient deaf or have difficulty hearing?: No Does the patient have difficulty seeing, even when wearing glasses/contacts?: No Does the patient have difficulty concentrating, remembering, or making decisions?: No  Permission Sought/Granted Permission sought to share information with :  (none) Permission granted to share information with : No              Emotional Assessment Appearance:: Appears stated age Attitude/Demeanor/Rapport: Gracious Affect (typically observed): Pleasant Orientation: : Oriented to Self, Oriented to Place, Oriented to  Time, Oriented to Situation Alcohol / Substance Use: Not Applicable Psych Involvement: No (comment)  Admission diagnosis:  DKA (diabetic ketoacidosis) (HCC) [E11.10] Hyperglycemia [R73.9] Patient Active Problem List   Diagnosis Date Noted   Anxiety 05/06/2023   DKA (diabetic ketoacidosis) (HCC) 05/06/2023   Cholelithiasis 04/04/2023   Hepatic steatosis 04/04/2023   Transaminitis 04/04/2023   Type 1 diabetes mellitus with hyperosmolar hyperglycemic state (HHS) (HCC) 03/31/2023   Intractable cyclical vomiting with nausea 03/31/2023   Cellulitis 02/02/2023   Severe hyperglycemia due to diabetes mellitus (HCC) 02/02/2023   Prolonged QT interval 02/02/2023   Intractable nausea and vomiting 09/07/2022   Diabetic gastroparesis (HCC) 09/07/2022   Uncontrolled type 1 diabetes mellitus with hyperglycemia,  with long-term current use of insulin (HCC) 09/07/2022   Diabetes mellitus due to underlying condition with diabetic neuropathy (HCC) 09/07/2022   History of noncompliance with medical treatment 09/07/2022   Nausea & vomiting 05/08/2022   Misuse of medication 05/08/2022   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   AKI (acute kidney injury) (HCC) 03/09/2022   UTI (urinary tract infection) 03/09/2022   Chronic pain syndrome 03/09/2022   Vulvar dysplasia    Type 1 diabetes mellitus with hyperlipidemia (HCC) 12/02/2021   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 - [address] [facility] [address] [place] [address] Phone: [phone] Fax: [phone]  [id number] - [address] ([place]), [place] - [address] [place] [address] Phone: [phone] Fax: [phone]  [facility] [address] [place] [address] Phone: [phone] Fax: [phone]  [facility] [address] [place] [address] Phone: [phone] Fax: [phone]  [facility] [address] [place] [address] Phone: [phone] Fax: [phone]     Social Drivers of Health (SDOH) Social History: SDOH Screenings   Food Insecurity: No Food Insecurity (08/05/2023)  Housing: Low Risk  (08/06/2023)  Transportation Needs: No Transportation Needs (08/05/2023)  Utilities: Not At Risk (08/05/2023)  Depression (PHQ2-9): Low Risk  (11/08/2021)  Social Connections: Unknown (01/30/2022)   Received from [facility]   Tobacco Use: Low Risk  (08/05/2023)   SDOH Interventions:     Readmission Risk Interventions    08/07/2023   11:55 AM 05/06/2023   12:55 PM 02/04/2023   11:55 AM  Readmission Risk Prevention Plan  Transportation Screening Complete Complete Complete  Medication Review Oceanographer) Complete Complete Complete  PCP or Specialist appointment within 3-5 days of discharge Complete  Complete  HRI or Home Care Consult  Complete Complete  SW Recovery Care/Counseling Consult  Complete   Palliative Care Screening  Not Applicable   Skilled Nursing Facility  Not Applicable

## 2023-08-07 NOTE — Discharge Summary (Signed)
Physician Discharge Summary  [name] QIO:[id number] DOB: [birth year]  PCP: Elie Confer, NP  Admitted from: Home Discharged to: Home  Admit date: 08/05/2023 Discharge date: 08/07/2023  Recommendations for Outpatient Follow-up:    Follow-up Information     Elie Confer, NP. Schedule an appointment as soon as possible for a visit in 1 week(s).   Why: To be seen with repeat labs (CBC & BMP). Contact information: [address] [place] [address] [phone]                  Home Health: None    Equipment/Devices: None    Discharge Condition: Improved and stable   Code Status: Full Code Diet recommendation:  Discharge Diet Orders (From admission, onward)     Start     Ordered   08/07/23 0000  Diet - low sodium heart healthy        08/07/23 1329   08/07/23 0000  Diet Carb Modified        08/07/23 1329             Discharge Diagnoses:  Active Problems:   Nausea & vomiting   Uncontrolled type 1 diabetes mellitus with hyperglycemia (HCC)   DKA (diabetic ketoacidosis) [facility])   Brief Hospital Course:  37 year old married female with PMH of type I DM with diabetic gastroparesis, neuropathy on chronic opioids, DKA, chronic constipation, VZV, runs out of insulins prematurely due to financial constraints despite having insurance, ran out of insulin 3 days ago coinciding with onset of high blood sugars, nausea, vomiting, abdominal pain, diffuse myalgia, polyuria and polydipsia.  Admitted to stepdown unit for marked hyperglycemia and early DKA.  Treated per DKA protocol with aggressive IV fluids, insulin drip, frequent BMP monitoring.  AG closed, transitioning to subcutaneous insulins.  DM coordinator consulted.     Assessment & Plan:   Type I DM complicated by diabetic gastroparesis and diabetic peripheral neuropathy, admitted with early DKA due to Insulin noncompliance from financial constraints: A1c 11.9 on 9/17 indicates poor  control Presented with following labs: VBG: pH 7.47.  Sodium 131, glucose 701, creatinine 1.2, calcium 10.4, anion gap 17, bicarbonate 26, BHB 3.68. Admitted to stepdown and treated per DKA protocol with aggressive IV fluids, insulin drip, frequent BMP monitoring. DKA resolved. Initiated Semglee 30 units twice daily (at home takes Levemir 40 in the morning, 60 in the evening with last dose being 12/15) As per communication with pharmacy, Levemir almost discontinued in the [place].  Lantus co-pay is $5 and NovoLog $65 PCP trying to arrange for outpatient endocrinology consultation Per admitting MDs note, neuropathy symptoms not controlled on gabapentin or Lyrica and hence on opioids PTA which also she ran out.  She will need to follow-up with her PCP for refills Tolerating diet.  Reasonable inpatient glycemic control on current regimen Diabetes coordinator was consulted and met with patient and assisted patient extensively with optimizing her home insulin regimen.  As per recommendations, patient will be discharged on Lantus Solostar pens ($5 co-pay) and NovoLog FlexPen's 4 units 3 times daily with meals in addition to sensitive SSI.  As per patient's follow-up with her insurance company, co-pay may be lesser than the $65 if she is prescribed a 90-day supply (sent a prescription for 90-day supply).  In case that does not work, DM coordinator has provided patient with a coupon that she can use to bring down the co-pay for NovoLog.  DM coordinator has also given patient continuous blood glucose  monitoring device/sensors. Close outpatient follow-up with PCP.   Acute kidney injury Secondary to dehydration and DKA. Resolved after IV fluids.   Sinus tachycardia:  Likely related to DKA and associated volume depletion Treated with aggressive IV fluid hydration, improved/resolved.   Hypokalemia Replaced.  Magnesium 1.9   Hypophosphatemia Replace and follow.   Constipation, chronic Secondary to chronic  narcotics Not taking Linzess. Last BM 12/17   Anxiety and depression Continue prior home meds of Cymbalta.  Not taking Remeron.    Body mass index is 26.58 kg/m.       Consultants:   None   Procedures:   None     Discharge Instructions  Discharge Instructions     Call MD for:  difficulty breathing, headache or visual disturbances   Complete by: As directed    Call MD for:  extreme fatigue   Complete by: As directed    Call MD for:  persistant dizziness or light-headedness   Complete by: As directed    Call MD for:  persistant nausea and vomiting   Complete by: As directed    Call MD for:  severe uncontrolled pain   Complete by: As directed    Call MD for:  temperature >100.4   Complete by: As directed    Diet - low sodium heart healthy   Complete by: As directed    Diet Carb Modified   Complete by: As directed    Discharge instructions   Complete by: As directed    Upon discharge, kindly call your family physician for refills of your pain medications.   Increase activity slowly   Complete by: As directed         Medication List     STOP taking these medications    Insulin Syringes (Disposable) U-100 1 ML Misc   Levemir FlexTouch 100 UNIT/ML FlexTouch Pen Generic drug: insulin detemir   Linzess 145 MCG Caps capsule Generic drug: linaclotide   metoCLOPramide 10 MG tablet Commonly known as: REGLAN   mirtazapine 15 MG tablet Commonly known as: REMERON   promethazine 25 MG suppository Commonly known as: PHENERGAN       TAKE these medications    B-D UF III MINI PEN NEEDLES 31G X 5 MM Misc Generic drug: Insulin Pen Needle To use with Insulin pens, as directed   Dexcom G6 Transmitter Misc Inject 1 each into the skin every 14 (fourteen) days.   DULoxetine 60 MG capsule Commonly known as: CYMBALTA Take 60 mg by mouth daily.   insulin aspart 100 UNIT/ML FlexPen Commonly known as: NOVOLOG Inject 0-9 Units into the skin 3 (three) times daily  with meals. CBG < 70: Eat or drink something sweet and recheck, CBG 70 - 120: 0 units CBG 121 - 150: 1 unit CBG 151 - 200: 2 units CBG 201 - 250: 3 units CBG 251 - 300: 5 units CBG 301 - 350: 7 units CBG 351 - 400: 9 units CBG > 400: call MD. What changed:  how much to take additional instructions   insulin aspart 100 UNIT/ML FlexPen Commonly known as: NOVOLOG Inject 4 Units into the skin 3 (three) times daily with meals. (take 4 units Novolog with each meal + Add correction as per sliding scale for elevated blood sugars) What changed: You were already taking a medication with the same name, and this prescription was added. Make sure you understand how and when to take each.   insulin glargine 100 UNIT/ML Solostar Pen Commonly known as: LANTUS  Inject 30 Units into the skin 2 (two) times daily.   oxyCODONE 15 MG immediate release tablet Commonly known as: ROXICODONE Take 15 mg by mouth every 6 (six) hours as needed for pain.       Allergies  Allergen Reactions   Mirapex [Pramipexole] Shortness Of Breath      Procedures/Studies: No results found.    Subjective: States that she feels much better.  Tolerating diet.  No pain reported.  No nausea or vomiting.  Eager to go home.  Discharge Exam:  Vitals:   08/06/23 2236 08/07/23 0229 08/07/23 0630 08/07/23 1011  BP: (!) 143/98 (!) 124/94 112/76 137/79  Pulse: 97 97 84 94  Resp: 18 18  16   Temp: 98.4 F (36.9 C) 98.2 F (36.8 C) 97.8 F (36.6 C) 98.4 F (36.9 C)  TempSrc: Oral Oral Oral Oral  SpO2: 100% 99% 100% 100%  Weight:      Height:        General exam: Young female, moderately built and nourished lying comfortably propped up in bed without distress.  Oral mucosa moist Respiratory system: Clear to auscultation.  No work of breathing. Cardiovascular system: S1 & S2 heard, regular tachycardia. No JVD, murmurs, rubs, gallops or clicks. No pedal edema.  Gastrointestinal system: Abdomen is nondistended, soft and  nontender. No organomegaly or masses felt. Normal bowel sounds heard. Central nervous system: Alert and oriented. No focal neurological deficits. Extremities: Symmetric 5 x 5 power. Skin: No rashes, lesions or ulcers Psychiatry: Judgement and insight appear normal. Mood & affect appropriate.     The results of significant diagnostics from this hospitalization (including imaging, microbiology, ancillary and laboratory) are listed below for reference.     Microbiology: Recent Results (from the past 240 hours)  MRSA Next Gen by PCR, Nasal     Status: Abnormal   Collection Time: 08/05/23  4:49 PM   Specimen: Nasal Mucosa; Nasal Swab  Result Value Ref Range Status   MRSA by PCR Next Gen DETECTED (A) NOT DETECTED Final    Comment: (NOTE) The GeneXpert MRSA Assay (FDA approved for NASAL specimens only), is one component of a comprehensive MRSA colonization surveillance program. It is not intended to diagnose MRSA infection nor to guide or monitor treatment for MRSA infections. Test performance is not FDA approved in patients less than 64 years old. Performed at [facility], [address] [place], [place] [address]      Labs: CBC: Recent Labs  Lab 08/05/23 0701 08/05/23 0814 08/05/23 1652 08/06/23 0318 08/07/23 0705  WBC 10.8*  --  11.9* 13.8* 7.9  NEUTROABS 7.3  --   --   --   --   HGB 14.3 14.3 13.5 12.4 12.5  HCT 40.8 42.0 39.0 36.9 37.6  MCV 86.8  --  90.1 92.0 93.3  PLT 333  --  284 277 252    Basic Metabolic Panel: Recent Labs  Lab 08/05/23 0701 08/05/23 0814 08/05/23 1652 08/06/23 0318 08/07/23 0705  NA 131* 132* 138 135 137  K 4.5 4.2 4.0 3.1* 3.6  CL 88*  --  102 101 104  CO2 26  --  23 24 26   GLUCOSE 701*  --  212* 163* 130*  BUN 20  --  17 13 9   CREATININE 1.20*  --  0.69 0.61 0.48  CALCIUM 10.4*  --  9.9 8.9 8.6*  MG  --   --   --  1.9  --   PHOS  --   --   --  2.3*  --     Liver Function Tests: Recent Labs  Lab  08/05/23 0701  AST 11*  ALT 9  ALKPHOS 83  BILITOT 0.3  PROT 8.0  ALBUMIN 4.3    CBG: Recent Labs  Lab 08/06/23 2149 08/06/23 2345 08/07/23 0426 08/07/23 0802 08/07/23 1125  GLUCAP 161* 168* 117* 149* 195*     Time coordinating discharge: 35 minutes  SIGNED:  Marcellus Scott, MD,  FACP, [facility], [facility], [facility]   [facility] Physician Advisor [place]     To contact the attending provider between 7A-7P or the covering provider during after hours 7P-7A, please log into the web site [email or web address] and access using universal [place] password for that web site. If you do not have the password, please call the hospital operator.

## 2023-08-07 NOTE — Progress Notes (Signed)
A&O, VSS, IV removed. AVS discussed w/pt, no further questions. Pt declines wheelchair, discharging ambulatory to discharge lounge.

## 2023-08-07 NOTE — Inpatient Diabetes Management (Addendum)
Inpatient Diabetes Program Recommendations  AACE/ADA: New Consensus Statement on Inpatient Glycemic Control (2015)  Target Ranges:  Prepandial:   less than 140 mg/dL      Peak postprandial:   less than 180 mg/dL (1-2 hours)      Critically ill patients:  140 - 180 mg/dL    Latest Reference Range & Units 08/06/23 07:37 08/06/23 12:10 08/06/23 16:24 08/06/23 21:49  Glucose-Capillary 70 - 99 mg/dL 161 (H)  2 units Novolog  258 (H)  8 units Novolog  30 units Semglee 245 (H)  5 units Novolog  161 (H)     30 units Semglee  (H): Data is abnormally high  Latest Reference Range & Units 08/07/23 08:02  Glucose-Capillary 70 - 99 mg/dL 096 (H)  (H): Data is abnormally high   Gave pt Web-site for Novolog Coupon--explained to pt how to fill out online and take her phone with the coupon to pharmacy  MD also to give pt Rx for 90 day supply of Novolog to see if pt can get the Novolog cheaper with 90 day supply    Discharge Recommendations:  Long acting recommendations: Insulin Glargine (LANTUS) Solostar Pen 30 units BID  Short acting recommendations:  Meal + Correction coverage Insulin aspart (NOVOLOG) FlexPen  Sensitive Scale.  1-10 units TID with meals (take 4 units Novolog with each meal + Add Correction for elevated CBGs) Supply/Referral recommendations: Pen needles - standard    Use Adult Diabetes Insulin Treatment Post Discharge order set.   --Will follow patient during hospitalization--  Ambrose Finland RN, MSN, CDCES Diabetes Coordinator Inpatient Glycemic Control Team Team Pager: [phone] (8a-5p)

## 2023-08-27 ENCOUNTER — Observation Stay (HOSPITAL_BASED_OUTPATIENT_CLINIC_OR_DEPARTMENT_OTHER)
Admission: EM | Admit: 2023-08-27 | Discharge: 2023-08-28 | Disposition: A | Discharge status: Home / Self Care | Payer: BC Managed Care – PPO | Attending: Emergency Medicine | Admitting: Emergency Medicine

## 2023-08-27 ENCOUNTER — Encounter (HOSPITAL_BASED_OUTPATIENT_CLINIC_OR_DEPARTMENT_OTHER): Payer: Self-pay

## 2023-08-27 ENCOUNTER — Other Ambulatory Visit: Payer: Self-pay

## 2023-08-27 ENCOUNTER — Emergency Department (HOSPITAL_BASED_OUTPATIENT_CLINIC_OR_DEPARTMENT_OTHER): Payer: BC Managed Care – PPO

## 2023-08-27 DIAGNOSIS — R109 Unspecified abdominal pain: Secondary | ICD-10-CM | POA: Insufficient documentation

## 2023-08-27 DIAGNOSIS — E1010 Type 1 diabetes mellitus with ketoacidosis without coma: Secondary | ICD-10-CM | POA: Insufficient documentation

## 2023-08-27 DIAGNOSIS — I4581 Long QT syndrome: Secondary | ICD-10-CM | POA: Insufficient documentation

## 2023-08-27 DIAGNOSIS — K8080 Other cholelithiasis without obstruction: Secondary | ICD-10-CM | POA: Insufficient documentation

## 2023-08-27 DIAGNOSIS — E876 Hypokalemia: Secondary | ICD-10-CM | POA: Diagnosis not present

## 2023-08-27 DIAGNOSIS — F419 Anxiety disorder, unspecified: Secondary | ICD-10-CM | POA: Insufficient documentation

## 2023-08-27 DIAGNOSIS — E1040 Type 1 diabetes mellitus with diabetic neuropathy, unspecified: Secondary | ICD-10-CM | POA: Diagnosis not present

## 2023-08-27 DIAGNOSIS — K3184 Gastroparesis: Secondary | ICD-10-CM | POA: Diagnosis present

## 2023-08-27 DIAGNOSIS — R81 Glycosuria: Secondary | ICD-10-CM | POA: Insufficient documentation

## 2023-08-27 DIAGNOSIS — R112 Nausea with vomiting, unspecified: Secondary | ICD-10-CM | POA: Diagnosis present

## 2023-08-27 DIAGNOSIS — R1084 Generalized abdominal pain: Secondary | ICD-10-CM

## 2023-08-27 DIAGNOSIS — F329 Major depressive disorder, single episode, unspecified: Secondary | ICD-10-CM | POA: Insufficient documentation

## 2023-08-27 DIAGNOSIS — E1065 Type 1 diabetes mellitus with hyperglycemia: Secondary | ICD-10-CM | POA: Insufficient documentation

## 2023-08-27 LAB — URINALYSIS, ROUTINE W REFLEX MICROSCOPIC
Bilirubin Urine: NEGATIVE
Glucose, UA: >1000 mg/dL — AB
Ketones, ur: >80 mg/dL — AB
Leukocytes,Ua: NEGATIVE
Nitrite: NEGATIVE
Protein, ur: 30 mg/dL — AB
Specific Gravity, Urine: 1.017 (ref 1.005–1.030)
pH: 6 (ref 5.0–8.0)

## 2023-08-27 LAB — CBC
HCT: 39.9 % (ref 36.0–46.0)
Hemoglobin: 13.8 g/dL (ref 12.0–15.0)
MCH: 30.7 pg (ref 26.0–34.0)
MCHC: 34.6 g/dL (ref 30.0–36.0)
MCV: 88.9 fL (ref 80.0–100.0)
Platelets: 321 10*3/uL (ref 150–400)
RBC: 4.49 MIL/uL (ref 3.87–5.11)
RDW: 13.5 % (ref 11.5–15.5)
WBC: 10 10*3/uL (ref 4.0–10.5)
nRBC: 0 % (ref 0.0–0.2)

## 2023-08-27 LAB — COMPREHENSIVE METABOLIC PANEL
ALT: 13 U/L (ref 0–44)
AST: 13 U/L — ABNORMAL LOW (ref 15–41)
Albumin: 4.4 g/dL (ref 3.5–5.0)
Alkaline Phosphatase: 85 U/L (ref 38–126)
Anion gap: 10 (ref 5–15)
BUN: 9 mg/dL (ref 6–20)
CO2: 25 mmol/L (ref 22–32)
Calcium: 9.9 mg/dL (ref 8.9–10.3)
Chloride: 95 mmol/L — ABNORMAL LOW (ref 98–111)
Creatinine, Ser: 0.69 mg/dL (ref 0.44–1.00)
GFR, Estimated: >60 mL/min (ref >60)
Glucose, Bld: 279 mg/dL — ABNORMAL HIGH (ref 70–99)
Potassium: 3.7 mmol/L (ref 3.5–5.1)
Sodium: 130 mmol/L — ABNORMAL LOW (ref 135–145)
Total Bilirubin: 0.8 mg/dL (ref 0.0–1.2)
Total Protein: 8 g/dL (ref 6.5–8.1)

## 2023-08-27 LAB — LIPASE, BLOOD: Lipase: <10 U/L — ABNORMAL LOW (ref 11–51)

## 2023-08-27 LAB — HEMOGLOBIN A1C
Hgb A1c MFr Bld: 9.4 % — ABNORMAL HIGH (ref 4.8–5.6)
Mean Plasma Glucose: 223.08 mg/dL

## 2023-08-27 LAB — MAGNESIUM: Magnesium: 1.8 mg/dL (ref 1.7–2.4)

## 2023-08-27 LAB — PHOSPHORUS: Phosphorus: 3.7 mg/dL (ref 2.5–4.6)

## 2023-08-27 LAB — GLUCOSE, CAPILLARY
Glucose-Capillary: 164 mg/dL — ABNORMAL HIGH (ref 70–99)
Glucose-Capillary: 227 mg/dL — ABNORMAL HIGH (ref 70–99)

## 2023-08-27 LAB — PREGNANCY, URINE: Preg Test, Ur: NEGATIVE

## 2023-08-27 LAB — CBG MONITORING, ED: Glucose-Capillary: 246 mg/dL — ABNORMAL HIGH (ref 70–99)

## 2023-08-27 MED ORDER — MORPHINE SULFATE (PF) 4 MG/ML IV SOLN
6.0000 mg | Freq: Once | INTRAVENOUS | Status: AC
  Administered 2023-08-27: 6 mg via INTRAVENOUS
  Filled 2023-08-27: qty 2

## 2023-08-27 MED ORDER — LACTATED RINGERS IV BOLUS
1000.0000 mL | Freq: Once | INTRAVENOUS | Status: AC
  Administered 2023-08-27: 1000 mL via INTRAVENOUS

## 2023-08-27 MED ORDER — LORAZEPAM 2 MG/ML IJ SOLN: 1.0000 mg | Freq: Once | INTRAMUSCULAR | Status: DC

## 2023-08-27 MED ORDER — METOCLOPRAMIDE HCL 5 MG/ML IJ SOLN
10.0000 mg | Freq: Three times a day (TID) | INTRAMUSCULAR | Status: DC
Start: 2023-08-27 — End: 2023-08-28
  Administered 2023-08-27 – 2023-08-28 (×2): 10 mg via INTRAVENOUS
  Filled 2023-08-27 (×2): qty 2

## 2023-08-27 MED ORDER — ENOXAPARIN SODIUM 40 MG/0.4ML IJ SOSY
40.0000 mg | PREFILLED_SYRINGE | INTRAMUSCULAR | Status: DC
  Administered 2023-08-27: 40 mg via SUBCUTANEOUS
  Filled 2023-08-27 (×2): qty 0.4

## 2023-08-27 MED ORDER — DULOXETINE HCL 30 MG PO CPEP
60.0000 mg | ORAL_CAPSULE | Freq: Every day | ORAL | Status: DC
  Administered 2023-08-28: 60 mg via ORAL
  Filled 2023-08-27: qty 2

## 2023-08-27 MED ORDER — SODIUM CHLORIDE 0.9 % IV BOLUS
1000.0000 mL | Freq: Once | INTRAVENOUS | Status: AC
  Administered 2023-08-27: 1000 mL via INTRAVENOUS

## 2023-08-27 MED ORDER — INSULIN GLARGINE-YFGN 100 UNIT/ML ~~LOC~~ SOLN
30.0000 [IU] | Freq: Two times a day (BID) | SUBCUTANEOUS | Status: DC
  Administered 2023-08-27 – 2023-08-28 (×2): 30 [IU] via SUBCUTANEOUS
  Filled 2023-08-27 (×3): qty 0.3

## 2023-08-27 MED ORDER — HYDROMORPHONE HCL 1 MG/ML IJ SOLN
0.5000 mg | Freq: Four times a day (QID) | INTRAMUSCULAR | Status: DC | PRN
  Administered 2023-08-27 – 2023-08-28 (×3): 0.5 mg via INTRAVENOUS
  Filled 2023-08-27 (×3): qty 0.5

## 2023-08-27 MED ORDER — INSULIN ASPART 100 UNIT/ML IJ SOLN
0.0000 [IU] | INTRAMUSCULAR | Status: DC
  Administered 2023-08-27: 3 [IU] via SUBCUTANEOUS
  Administered 2023-08-27: 5 [IU] via SUBCUTANEOUS
  Administered 2023-08-28: 3 [IU] via SUBCUTANEOUS

## 2023-08-27 MED ORDER — ACETAMINOPHEN 325 MG PO TABS
650.0000 mg | ORAL_TABLET | Freq: Four times a day (QID) | ORAL | Status: DC | PRN
  Administered 2023-08-27: 650 mg via ORAL
  Filled 2023-08-27: qty 2

## 2023-08-27 MED ORDER — LORAZEPAM 2 MG/ML IJ SOLN
1.0000 mg | Freq: Once | INTRAMUSCULAR | Status: AC
  Administered 2023-08-27: 1 mg via INTRAVENOUS
  Filled 2023-08-27: qty 1

## 2023-08-27 MED ORDER — ACETAMINOPHEN 650 MG RE SUPP: 650.0000 mg | Freq: Four times a day (QID) | RECTAL | Status: DC | PRN

## 2023-08-27 MED ORDER — SENNOSIDES-DOCUSATE SODIUM 8.6-50 MG PO TABS: 1.0000 | ORAL_TABLET | Freq: Every evening | ORAL | Status: DC | PRN

## 2023-08-27 MED ORDER — METOCLOPRAMIDE HCL 5 MG/ML IJ SOLN
10.0000 mg | Freq: Once | INTRAMUSCULAR | Status: AC
  Administered 2023-08-27: 10 mg via INTRAVENOUS
  Filled 2023-08-27: qty 2

## 2023-08-27 MED ORDER — PANTOPRAZOLE SODIUM 40 MG IV SOLR
40.0000 mg | Freq: Once | INTRAVENOUS | Status: AC
  Administered 2023-08-27: 40 mg via INTRAVENOUS
  Filled 2023-08-27: qty 10

## 2023-08-27 MED ORDER — ONDANSETRON HCL 4 MG/2ML IJ SOLN
4.0000 mg | Freq: Once | INTRAMUSCULAR | Status: AC
  Administered 2023-08-27: 4 mg via INTRAVENOUS
  Filled 2023-08-27: qty 2

## 2023-08-27 MED ORDER — LABETALOL HCL 5 MG/ML IV SOLN
10.0000 mg | INTRAVENOUS | Status: DC | PRN
  Filled 2023-08-27: qty 4

## 2023-08-27 MED ORDER — LORAZEPAM 2 MG/ML IJ SOLN
0.5000 mg | Freq: Three times a day (TID) | INTRAMUSCULAR | Status: DC | PRN
  Administered 2023-08-27: 0.5 mg via INTRAVENOUS
  Filled 2023-08-27: qty 1

## 2023-08-27 NOTE — H&P (Signed)
 History and Physical    Patient: [name] FMW:[id number] DOB: Oct 26, 1985 DOA: 08/27/2023 DOS: the patient was seen and examined on 08/27/2023 PCP: Cristopher Suzen HERO, NP  Patient coming from: [facility]  Chief Complaint:  Chief Complaint  Patient presents with   Emesis   HPI: [name] is a 38 y.o. female with medical history significant of T1DM, DKA, diabetic gastroparesis, diabetic neuropathy on chronic narcotics, and chronic constipation who presented to the [facility] ED for evaluation of abdominal pain, nausea and vomiting. Patient reports she had some soup yesterday afternoon and at night, she started having constant nausea and vomiting with mild abdominal pain.  This morning, she continued to have vomiting back-to-back and her abdominal pain was worse so she presented to the ED for further evaluation. She prescribed the emesis is yellowish in color, nonbilious and nonbloody. She has been taking her as needed Reglan  without improvement in her pain.  She endorses chronic neuropathy and reports that she noticed a foul odor to her urine yesterday. She denies any fevers, chills, chest pain, shortness of breath, hematuria or or dysuria  [facility] ED course: Initial vitals with temp 98.7, RR 18, HR 110, BP 175/111, SpO2 100% on room air Labs show CBG 246, normal lipase, sodium 130, K+ 3.7, glucose 279, creatinine 0.69, unremarkable CBC, UA shows significant glucosuria and ketonuria, mild hemoglobinuria and proteinuria, negative nitrite, negative leuks, RBC 0-5, WBC 0-5, many bacteria, negative pregnancy test, mag 1.8, Phos 3.7 RUQ U/S shows small volume cholelithiasis/sludge without evidence of acute cholecystitis Patient received IV morphine  gram x 2, IV Zofran  4 mg x 1, IV Protonix  40 mg x 1 and 2 L IV fluid bolus. Due to persistent nausea and vomiting, she received IV Ativan  1 mg x 1 and IV Reglan  10 mg x 1 Patient was admitted to [facility] service and transferred to  [facility]  Review of Systems: As mentioned in the history of present illness. All other systems reviewed and are negative. Past Medical History:  Diagnosis Date   Chronic constipation 11/08/2021   Diabetic neuropathy (HCC)    History of chicken pox    History of noncompliance with medical treatment 09/07/2022   Type 1 diabetes (HCC)    Vaginal odor 11/08/2021   Past Surgical History:  Procedure Laterality Date   CESAREAN SECTION     only one C/S per pt.   CO2 LASER APPLICATION N/A 02/22/2022   Procedure: CO2 LASER APPLICATION TO VULVA;  Surgeon: Viktoria Comer SAUNDERS, MD;  Location: [facility] [place];  Service: General;  Laterality: N/A;   DILATION AND CURETTAGE OF UTERUS     EXCISION OF SKIN TAG N/A 02/22/2022   Procedure: EXCISION OF ANAL SKIN TAGS;  Surgeon: Debby Hila, MD;  Location: [facility] [place];  Service: General;  Laterality: N/A;   Social History:  reports that she has never smoked. She has never been exposed to tobacco smoke. She has never used smokeless tobacco. She reports that she does not drink alcohol and does not use drugs.  Allergies  Allergen Reactions   Mirapex [Pramipexole] Shortness Of Breath    Family History  Problem Relation Age of Onset   Hypertension Mother    Glaucoma Mother    Diabetes Maternal Grandmother    Neuropathy Neg Hx    Colon cancer Neg Hx    Stomach cancer Neg Hx    Esophageal cancer Neg Hx    Colon polyps Neg Hx    Rectal cancer  Neg Hx     Prior to Admission medications   Medication Sig Start Date End Date Taking? Authorizing Provider  Continuous Blood Gluc Transmit (DEXCOM G6 TRANSMITTER) MISC Inject 1 each into the skin every 14 (fourteen) days. 12/31/21   Lucius Krabbe, NP  DULoxetine  (CYMBALTA ) 60 MG capsule Take 60 mg by mouth daily. 06/28/22   [provider]  insulin  aspart (NOVOLOG ) 100 UNIT/ML FlexPen Inject 0-9 Units into the skin 3 (three) times daily with meals. CBG < 70: Eat or drink  something sweet and recheck, CBG 70 - 120: 0 units CBG 121 - 150: 1 unit CBG 151 - 200: 2 units CBG 201 - 250: 3 units CBG 251 - 300: 5 units CBG 301 - 350: 7 units CBG 351 - 400: 9 units CBG > 400: call MD. 08/07/23 11/05/23  Hongalgi, Anand D, MD  insulin  aspart (NOVOLOG ) 100 UNIT/ML FlexPen Inject 4 Units into the skin 3 (three) times daily with meals. (take 4 units Novolog  with each meal + Add correction as per sliding scale for elevated blood sugars) 08/07/23   Hongalgi, Trenda BIRCH, MD  insulin  glargine (LANTUS ) 100 UNIT/ML Solostar Pen Inject 30 Units into the skin 2 (two) times daily. 08/07/23   Hongalgi, Trenda BIRCH, MD  Insulin  Pen Needle (B-D UF III MINI PEN NEEDLES) 31G X 5 MM MISC To use with Insulin  pens, as directed 08/07/23   [name], MD    Physical Exam: Vitals:   08/27/23 1330 08/27/23 1541 08/27/23 1600 08/27/23 1713  BP: (!) 154/93  (!) 178/107 (!) 184/108  Pulse: (!) 119  (!) 114 (!) 115  Resp: (!) 28  (!) 23   Temp:  98.2 F (36.8 C)  98.2 F (36.8 C)  TempSrc:      SpO2: 100%  100% 100%  Weight:      Height:       General: Pleasant, well-appearing middle-aged woman laying in bed. No acute distress. HEENT: [place]/AT. Anicteric sclera.  Dry mucous membrane CV: Tachycardic. Regular rhythm. No murmurs, rubs, or gallops. No LE edema Pulmonary: Lungs CTAB. Normal effort. No wheezing or rales. Abdominal: Soft, nondistended. Mild generalized tenderness to palpation. Hypoactive bowel movement. Extremities: Palpable radial and DP pulses. Normal ROM. Skin: Warm and dry. No obvious rash or lesions. Neuro: A&Ox3. Moves all extremities. Normal sensation to light touch. No focal deficit. Psych: Normal mood and affect  Data Reviewed: Labs show CBG 246, normal lipase, sodium 130, K+ 3.7, glucose 279, creatinine 0.69, unremarkable CBC, UA shows significant glucosuria and ketonuria, mild hemoglobinuria and proteinuria, negative nitrite, negative leuks, RBC 0-5, WBC 0-5, many bacteria,  negative pregnancy test, mag 1.8, Phos 3.7 EKG shows sinus tach with a rate of 112 and slightly prolonged QTc of 485 RUQ U/S shows small volume cholelithiasis/sludge without evidence of acute cholecystitis  Assessment and Plan: Winna L McAdoo Garner is a 38 y.o. female with medical history significant of T1DM, DKA, diabetic gastroparesis, diabetic neuropathy on chronic narcotics, and chronic constipation who presented to the [facility] ED for evaluation of abdominal pain, nausea and vomiting and admitted for further management.  # Diabetic gastroparesis # Intractable nausea and vomiting # Abdominal pain Young patient with history of type 1 diabetes complicated by gastroparesis and neuropathy presented with generalized abdominal pain and persistent nausea vomiting not responsive to home Reglan . Reports Zofran  does not seem to help but Ativan  has helped improve her nausea. She continues to have generalized abdominal pain. Will admit for observation overnight with  antiemetics and pain control and advance diet as tolerated. -Give additional 1 L IV LR bolus -IV Ativan  0.5 mg every 8 hours as needed for nausea and vomiting -IV Dilaudid  0.5 mg every 6 hours as needed for pain -IV Reglan  10 mg every 8 hours -Advance diet as tolerated  # T1DM # Diabetic neuropathy Last A1c 11.9% in September. States she is currently on Lantus  30 units twice daily and NovoLog  4 units with each meals plus a sliding scale. Reports compliance with her insulin  regimen.  UA shows significant glucosuria and ketonuria but CMP does not show evidence of DKA. CBGs in the 220s to 240s. Patient is on chronic oxycodone  as needed for her neuropathic pain as she has previously failed Lyrica  and gabapentin . -Semglee  30 units twice daily -SSI every 4 hours with CBG monitoring -Follow-up repeat A1c -Resume as needed oxycodone  when able to tolerate p.o. intake  # Cholelithiasis Patient has a history of gallstones. RUQ U/S shows  small volume cholelithiasis/large without signs of acute cholecystitis.  Normal LFTs.  Unclear how much this is contributing to her abdominal pain. -Can consider elective cholecystectomy in the outpatient.  # Anxiety and depression -Continue on duloxetine  60 mg at bedtime  # QTc prolongation EKG shows mild QTc prolongation of 485. She is on as needed Reglan  at home. -Judicious with QTc prolonging meds   Advance Care Planning:   Code Status: Full Code   Consults: None  Family Communication: No family at bedside  Severity of Illness: The appropriate patient status for this patient is OBSERVATION. Observation status is judged to be reasonable and necessary in order to provide the required intensity of service to ensure the patient's safety. The patient's presenting symptoms, physical exam findings, and initial radiographic and laboratory data in the context of their medical condition is felt to place them at decreased risk for further clinical deterioration. Furthermore, it is anticipated that the patient will be medically stable for discharge from the hospital within 2 midnights of admission.   Author: Claretta CHRISTELLA Alderman, MD 08/27/2023 6:47 PM  For on call review [email or web address].

## 2023-08-27 NOTE — ED Notes (Signed)
 Report given to [facility].

## 2023-08-27 NOTE — ED Notes (Signed)
 Report given to the floor RN.

## 2023-08-27 NOTE — ED Notes (Signed)
 RT Note: Patient had to be placed on 2lpm [place] to maintain oxygen saturation at 98%. She was having a decrease in oxygen saturation while dozing off.

## 2023-08-27 NOTE — ED Notes (Signed)
 Called Thomas at [facility] for transport at 15:58

## 2023-08-27 NOTE — ED Provider Notes (Signed)
 [place] EMERGENCY DEPARTMENT AT [facility] Provider Note   CSN: [id number] Arrival date & time: 08/27/23  9348     History  Chief Complaint  Patient presents with   Emesis    [name] is a 38 y.o. female.  Patient is a 38 year old female with a history of insulin -dependent diabetes and gastroparesis.  She presents with nausea vomiting and upper abdominal pain.  She has had similar symptoms in the past with her gastroparesis.  She has been admitted previously for DKA.  It is documented that she has had 2 ration her insulin .  She says that she has been taking her insulin  as directed and has not missed any doses.  She says her sugars have been up and down.  She complains of nausea and vomiting that started yesterday.  She has pain across her upper abdomen.  No diarrhea.  No known fevers.  No respiratory symptoms.       Home Medications Prior to Admission medications   Medication Sig Start Date End Date Taking? Authorizing Provider  Continuous Blood Gluc Transmit (DEXCOM G6 TRANSMITTER) MISC Inject 1 each into the skin every 14 (fourteen) days. 12/31/21   Lucius Krabbe, NP  DULoxetine  (CYMBALTA ) 60 MG capsule Take 60 mg by mouth daily. 06/28/22   [provider]  insulin  aspart (NOVOLOG ) 100 UNIT/ML FlexPen Inject 0-9 Units into the skin 3 (three) times daily with meals. CBG < 70: Eat or drink something sweet and recheck, CBG 70 - 120: 0 units CBG 121 - 150: 1 unit CBG 151 - 200: 2 units CBG 201 - 250: 3 units CBG 251 - 300: 5 units CBG 301 - 350: 7 units CBG 351 - 400: 9 units CBG > 400: call MD. 08/07/23 11/05/23  Hongalgi, Anand D, MD  insulin  aspart (NOVOLOG ) 100 UNIT/ML FlexPen Inject 4 Units into the skin 3 (three) times daily with meals. (take 4 units Novolog  with each meal + Add correction as per sliding scale for elevated blood sugars) 08/07/23   Hongalgi, Trenda BIRCH, MD  insulin  glargine (LANTUS ) 100 UNIT/ML Solostar Pen Inject 30 Units into  the skin 2 (two) times daily. 08/07/23   Hongalgi, Trenda BIRCH, MD  Insulin  Pen Needle (B-D UF III MINI PEN NEEDLES) 31G X 5 MM MISC To use with Insulin  pens, as directed 08/07/23   Hongalgi, Anand D, MD      Allergies    Mirapex [pramipexole]    Review of Systems   Review of Systems  Constitutional:  Negative for chills, diaphoresis, fatigue and fever.  HENT:  Negative for congestion, rhinorrhea and sneezing.   Eyes: Negative.   Respiratory:  Negative for cough, chest tightness and shortness of breath.   Cardiovascular:  Negative for chest pain and leg swelling.  Gastrointestinal:  Positive for nausea and vomiting. Negative for abdominal pain, blood in stool and diarrhea.  Genitourinary:  Negative for difficulty urinating, flank pain, frequency and hematuria.  Musculoskeletal:  Negative for arthralgias and back pain.  Skin:  Negative for rash.  Neurological:  Negative for dizziness, speech difficulty, weakness, numbness and headaches.    Physical Exam Updated Vital Signs BP (!) 154/93   Pulse (!) 119   Temp 97.7 F (36.5 C) (Oral)   Resp (!) 28   Ht 5' 6 (1.676 m)   Wt 74 kg   LMP 08/18/2023 (Exact Date)   SpO2 100%   BMI 26.33 kg/m  Physical Exam Constitutional:      Appearance: She  is well-developed.  HENT:     Head: Normocephalic and atraumatic.  Eyes:     Pupils: Pupils are equal, round, and reactive to light.  Cardiovascular:     Rate and Rhythm: Normal rate and regular rhythm.     Heart sounds: Normal heart sounds.  Pulmonary:     Effort: Pulmonary effort is normal. No respiratory distress.     Breath sounds: Normal breath sounds. No wheezing or rales.  Chest:     Chest wall: No tenderness.  Abdominal:     General: Bowel sounds are normal.     Palpations: Abdomen is soft.     Tenderness: There is abdominal tenderness (Tenderness across the upper abdomen). There is no guarding or rebound.  Musculoskeletal:        General: Normal range of motion.     Cervical  back: Normal range of motion and neck supple.  Lymphadenopathy:     Cervical: No cervical adenopathy.  Skin:    General: Skin is warm and dry.     Findings: No rash.  Neurological:     Mental Status: She is alert and oriented to person, place, and time.     ED Results / Procedures / Treatments   Labs (all labs ordered are listed, but only abnormal results are displayed) Labs Reviewed  LIPASE, BLOOD - Abnormal; Notable for the following components:      Result Value   Lipase <10 (*)    All other components within normal limits  COMPREHENSIVE METABOLIC PANEL - Abnormal; Notable for the following components:   Sodium 130 (*)    Chloride 95 (*)    Glucose, Bld 279 (*)    AST 13 (*)    All other components within normal limits  URINALYSIS, ROUTINE W REFLEX MICROSCOPIC - Abnormal; Notable for the following components:   APPearance HAZY (*)    Glucose, UA >1,000 (*)    Hgb urine dipstick SMALL (*)    Ketones, ur >80 (*)    Protein, ur 30 (*)    Bacteria, UA MANY (*)    All other components within normal limits  CBG MONITORING, ED - Abnormal; Notable for the following components:   Glucose-Capillary 246 (*)    All other components within normal limits  CBC  PREGNANCY, URINE    EKG EKG Interpretation Date/Time:  Wednesday August 27 2023 07:01:42 EST Ventricular Rate:  112 PR Interval:  161 QRS Duration:  78 QT Interval:  355 QTC Calculation: 485 R Axis:   86  Text Interpretation: Sinus tachycardia since last tracing no significant change Confirmed by Lenor Hollering [phone]) on 08/27/2023 7:12:53 AM  Radiology US  Abdomen Limited RUQ (LIVER/GB) Result Date: 08/27/2023 CLINICAL DATA:  Right upper quadrant abdominal pain. EXAM: ULTRASOUND ABDOMEN LIMITED RIGHT UPPER QUADRANT COMPARISON:  None Available. FINDINGS: Gallbladder: Physiologically distended. Small volume layering sludge/calculi noted. No abnormal wall thickening or pericholecystic free fluid. Sonographic Murphy's sign  was negative as per the technologist. Common bile duct: Diameter: Less than 2 mm.  No intrahepatic bile duct dilation. Liver: No focal lesion identified. Within normal limits in parenchymal echogenicity. Portal vein is patent on color Doppler imaging with normal direction of blood flow towards the liver. Other: None. IMPRESSION: *Small volume cholelithiasis/sludge without imaging signs of acute cholecystitis. Electronically Signed   By: Ree Molt M.D.   On: 08/27/2023 08:55    Procedures Procedures    Medications Ordered in ED Medications  sodium chloride  0.9 % bolus 1,000 mL (0 mLs Intravenous  Stopped 08/27/23 0912)  ondansetron  (ZOFRAN ) injection 4 mg (4 mg Intravenous Given 08/27/23 0746)  pantoprazole  (PROTONIX ) injection 40 mg (40 mg Intravenous Given 08/27/23 0747)  morphine  (PF) 4 MG/ML injection 6 mg (6 mg Intravenous Given 08/27/23 0750)  metoCLOPramide  (REGLAN ) injection 10 mg (10 mg Intravenous Given 08/27/23 0858)  morphine  (PF) 4 MG/ML injection 6 mg (6 mg Intravenous Given 08/27/23 1015)  LORazepam  (ATIVAN ) injection 1 mg (1 mg Intravenous Given 08/27/23 1014)  lactated ringers  bolus 1,000 mL (0 mLs Intravenous Stopped 08/27/23 1347)    ED Course/ Medical Decision Making/ A&P                                 Medical Decision Making Amount and/or Complexity of Data Reviewed Labs: ordered. Radiology: ordered.  Risk Prescription drug management. Decision regarding hospitalization.  Patient is a 38 year old who presents with nausea and vomiting.  She has a history of diabetes and gastroparesis.  Labs show evidence of hyperglycemia but she does not have suggestions of DKA.  No evidence of pancreatitis.  CT scan does not show any acute abnormality.  Her urine is not consistent with infection.  She was given IV fluids and antiemetics as well as pain medication.  She initially was feeling a bit better but was still tachycardic.  Was going to treated with p.o. trial but then she started  vomiting again.  Has ongoing symptoms despite treatment in the ED.  I spoke with Dr. Verdene who will admit the pt.  Final Clinical Impression(s) / ED Diagnoses Final diagnoses:  Nausea and vomiting, unspecified vomiting type  Gastroparesis    Rx / DC Orders ED Discharge Orders     None         Lenor Hollering, MD 08/27/23 1420

## 2023-08-27 NOTE — ED Triage Notes (Addendum)
 Pt states she began having abdominal pain last night and vomiting.  States her BS have been up and down.  Pt passed out after throwing up, had to have assistance getting out of car

## 2023-08-27 NOTE — ED Notes (Signed)
 Pt aware of the need for a urine... Unable to currently provide a sample.Caitlyn KitchenMarland Garner

## 2023-08-28 DIAGNOSIS — R112 Nausea with vomiting, unspecified: Secondary | ICD-10-CM | POA: Diagnosis not present

## 2023-08-28 LAB — BASIC METABOLIC PANEL
Anion gap: 9 (ref 5–15)
BUN: 10 mg/dL (ref 6–20)
CO2: 26 mmol/L (ref 22–32)
Calcium: 8.7 mg/dL — ABNORMAL LOW (ref 8.9–10.3)
Chloride: 99 mmol/L (ref 98–111)
Creatinine, Ser: 0.61 mg/dL (ref 0.44–1.00)
GFR, Estimated: >60 mL/min (ref >60)
Glucose, Bld: 157 mg/dL — ABNORMAL HIGH (ref 70–99)
Potassium: 3 mmol/L — ABNORMAL LOW (ref 3.5–5.1)
Sodium: 134 mmol/L — ABNORMAL LOW (ref 135–145)

## 2023-08-28 LAB — CBC
HCT: 33.2 % — ABNORMAL LOW (ref 36.0–46.0)
Hemoglobin: 11.5 g/dL — ABNORMAL LOW (ref 12.0–15.0)
MCH: 31.8 pg (ref 26.0–34.0)
MCHC: 34.6 g/dL (ref 30.0–36.0)
MCV: 91.7 fL (ref 80.0–100.0)
Platelets: 277 10*3/uL (ref 150–400)
RBC: 3.62 MIL/uL — ABNORMAL LOW (ref 3.87–5.11)
RDW: 13.4 % (ref 11.5–15.5)
WBC: 8.9 10*3/uL (ref 4.0–10.5)
nRBC: 0 % (ref 0.0–0.2)

## 2023-08-28 LAB — GLUCOSE, CAPILLARY
Glucose-Capillary: 48 mg/dL — ABNORMAL LOW (ref 70–99)
Glucose-Capillary: 163 mg/dL — ABNORMAL HIGH (ref 70–99)
Glucose-Capillary: 118 mg/dL — ABNORMAL HIGH (ref 70–99)

## 2023-08-28 MED ORDER — CEPHALEXIN 500 MG PO CAPS: 500.0000 mg | ORAL_CAPSULE | Freq: Four times a day (QID) | ORAL | 0 refills | Status: AC

## 2023-08-28 MED ORDER — IPRATROPIUM-ALBUTEROL 0.5-2.5 (3) MG/3ML IN SOLN: 3.0000 mL | RESPIRATORY_TRACT | Status: DC | PRN

## 2023-08-28 MED ORDER — METOPROLOL TARTRATE 5 MG/5ML IV SOLN: 5.0000 mg | INTRAVENOUS | Status: DC | PRN

## 2023-08-28 MED ORDER — POTASSIUM CHLORIDE 10 MEQ/100ML IV SOLN
10.0000 meq | INTRAVENOUS | Status: AC
  Administered 2023-08-28 (×5): 10 meq via INTRAVENOUS
  Filled 2023-08-28 (×2): qty 100

## 2023-08-28 MED ORDER — GUAIFENESIN 100 MG/5ML PO LIQD: 5.0000 mL | ORAL | Status: DC | PRN

## 2023-08-28 MED ORDER — BISACODYL 5 MG PO TBEC
5.0000 mg | DELAYED_RELEASE_TABLET | Freq: Every day | ORAL | Status: DC
Start: 2023-08-28 — End: 2023-08-28

## 2023-08-28 MED ORDER — HYDRALAZINE HCL 20 MG/ML IJ SOLN: 10.0000 mg | INTRAMUSCULAR | Status: DC | PRN

## 2023-08-28 MED ORDER — TRAZODONE HCL 50 MG PO TABS: 50.0000 mg | ORAL_TABLET | Freq: Every evening | ORAL | Status: DC | PRN

## 2023-08-28 MED ORDER — GLUCAGON HCL RDNA (DIAGNOSTIC) 1 MG IJ SOLR: 1.0000 mg | INTRAMUSCULAR | Status: DC | PRN

## 2023-08-28 MED ORDER — DEXTROSE 50 % IV SOLN
25.0000 g | INTRAVENOUS | Status: AC
  Administered 2023-08-28: 25 g via INTRAVENOUS
  Filled 2023-08-28: qty 50

## 2023-08-28 MED ORDER — ONDANSETRON 4 MG PO TBDP: 4.0000 mg | ORAL_TABLET | Freq: Three times a day (TID) | ORAL | 0 refills | Status: DC | PRN

## 2023-08-28 MED ORDER — POTASSIUM CHLORIDE CRYS ER 20 MEQ PO TBCR: 40.0000 meq | EXTENDED_RELEASE_TABLET | Freq: Every day | ORAL | 0 refills | Status: DC

## 2023-08-28 MED ORDER — ONDANSETRON HCL 4 MG/2ML IJ SOLN: 4.0000 mg | Freq: Four times a day (QID) | INTRAMUSCULAR | Status: DC | PRN

## 2023-08-28 NOTE — Inpatient Diabetes Management (Signed)
 Inpatient Diabetes Program Recommendations  AACE/ADA: New Consensus Statement on Inpatient Glycemic Control (2015)  Target Ranges:  Prepandial:   less than 140 mg/dL      Peak postprandial:   less than 180 mg/dL (1-2 hours)      Critically ill patients:  140 - 180 mg/dL   Lab Results  Component Value Date   GLUCAP 163 (H) 08/28/2023   HGBA1C 9.4 (H) 08/27/2023    Review of Glycemic Control  Latest Reference Range & Units 08/27/23 19:59 08/27/23 23:47 08/28/23 04:14 08/28/23 05:01  Glucose-Capillary 70 - 99 mg/dL 772 (H) 835 (H) 48 (L) 163 (H)  (H): Data is abnormally high (L): Data is abnormally low  Diabetes history: DM1 Outpatient Diabetes medications: Novolog  0-9 units TID, Lantus  30 units BID, Dexcom G6 Current orders for Inpatient glycemic control: Semglee  30 units BID, Novolog  0-15 units Q4H  Inpatient Diabetes Program Recommendations:    Please consider:  Semglee  22 units BID Novolog  0-9 units Q4H  Will continue to follow while inpatient.  Thank you, Wyvonna Pinal, MSN, CDCES Diabetes Coordinator Inpatient Diabetes Program [phone] (team pager from 8a-5p)

## 2023-08-28 NOTE — Progress Notes (Signed)
   08/28/23 1133  TOC Brief Assessment  Insurance and Status Reviewed  Patient has primary care physician Yes  Home environment has been reviewed Home w/ spouse  Prior level of function: Independent  Prior/Current Home Services No current home services  Social Drivers of Health Review SDOH reviewed no interventions necessary  Readmission risk has been reviewed Yes  Transition of care needs no transition of care needs at this time

## 2023-08-28 NOTE — Progress Notes (Addendum)
 The patient's CBG at 0400 was 48, and she were asymptomatic. The hypoglycemia protocol was initiated, and the patient received 25 g of 50% dextrose  solution. A follow-up CBG 15 minutes after administration of dextrose  was 163. The hospitalist on call, Lavanda Horns, NP, was notified.

## 2023-08-28 NOTE — Hospital Course (Addendum)
 Brief Narrative:  38 year old with history of DM1 with diabetic gastroparesis, neuropathy, chronic opioid use, prior history of DKA, chronic constipation, VZV, frequent noncompliance due to financial constraints, chronic constipation comes to [facility] ED with complaints of abdominal pain, nausea and vomiting.  Upon admission noted to have hyperglycemia without evidence of DKA, right upper quadrant ultrasound showed cholelithiasis with sludge but no evidence of acute cholecystitis.  Admitted to [facility] with concerns of diabetic gastroparesis and placed on skin will Reglan . Over the course of 24 hours of hospitalization patient did significantly well and was tolerating oral without any issues.  Medically stable for discharge.  Assessment & Plan:  Principal Problem:   Intractable nausea and vomiting Active Problems:   Gastroparesis     Intractable nausea vomiting secondary to diabetic gastroparesis, rule Hypokalemia She is significantly well today and would like to be discharged if she is able to tolerate p.o. lunch.  Requesting as needed Zofran  upon discharge which I will prescribe. -Replete electrolytes.   Insulin -dependent diabetes mellitus type 1 Peripheral neuropathy A1c 9.4.  Resume home insulin  regimen  Glucosuria/symptomatic bacteriuria - Glucosuria should improve with better control and her diabetes.   Reporting of urinary burning, ordered 3 days of keflex .    Cholelithiasis -Appears to be chronic.  No current evidence of acute cholecystitis on right upper quadrant ultrasound.  Will continue to monitor and should follow-up outpatient   Anxiety and depression -On Cymbalta  60 mg daily   QTc prolongation Stable   DVT prophylaxis: enoxaparin  (LOVENOX ) injection 40 mg Start: 08/27/23 2000    Code Status: Full Code Family Communication:   Will discharge today if she tolerates p.o. lunch    Subjective: Feeling well no complaints.  She would like to try regular diabetic diet and  if she tolerates this she would like to be discharged which I agree.   Examination:  General exam: Appears calm and comfortable  Respiratory system: Clear to auscultation. Respiratory effort normal. Cardiovascular system: S1 & S2 heard, RRR. No JVD, murmurs, rubs, gallops or clicks. No pedal edema. Gastrointestinal system: Abdomen is nondistended, soft and nontender. No organomegaly or masses felt. Normal bowel sounds heard. Central nervous system: Alert and oriented. No focal neurological deficits. Extremities: Symmetric 5 x 5 power. Skin: No rashes, lesions or ulcers Psychiatry: Judgement and insight appear normal. Mood & affect appropriate.

## 2023-08-28 NOTE — Plan of Care (Signed)
  Problem: Education: Goal: Knowledge of General Education information will improve Description: Including pain rating scale, medication(s)/side effects and non-pharmacologic comfort measures Outcome: Progressing   Problem: Health Behavior/Discharge Planning: Goal: Ability to manage health-related needs will improve Outcome: Progressing   Problem: Clinical Measurements: Goal: Ability to maintain clinical measurements within normal limits will improve Outcome: Progressing Goal: Will remain free from infection Outcome: Progressing Goal: Diagnostic test results will improve Outcome: Progressing Goal: Respiratory complications will improve Outcome: Progressing Goal: Cardiovascular complication will be avoided Outcome: Progressing   Problem: Activity: Goal: Risk for activity intolerance will decrease Outcome: Progressing   Problem: Nutrition: Goal: Adequate nutrition will be maintained Outcome: Progressing   Problem: Coping: Goal: Level of anxiety will decrease Outcome: Progressing   Problem: Elimination: Goal: Will not experience complications related to bowel motility Outcome: Progressing Goal: Will not experience complications related to urinary retention Outcome: Progressing   Problem: Pain Management: Goal: General experience of comfort will improve Outcome: Progressing   Problem: Safety: Goal: Ability to remain free from injury will improve Outcome: Progressing   Problem: Skin Integrity: Goal: Risk for impaired skin integrity will decrease Outcome: Progressing   Problem: Education: Goal: Ability to describe self-care measures that may prevent or decrease complications (Diabetes Survival Skills Education) will improve Outcome: Progressing Goal: Individualized Educational Video(s) Outcome: Progressing   Problem: Coping: Goal: Ability to adjust to condition or change in health will improve Outcome: Progressing   Problem: Fluid Volume: Goal: Ability to  maintain a balanced intake and output will improve Outcome: Progressing   Problem: Health Behavior/Discharge Planning: Goal: Ability to identify and utilize available resources and services will improve Outcome: Progressing Goal: Ability to manage health-related needs will improve Outcome: Progressing   Problem: Metabolic: Goal: Ability to maintain appropriate glucose levels will improve Outcome: Progressing   Problem: Nutritional: Goal: Maintenance of adequate nutrition will improve Outcome: Progressing Goal: Progress toward achieving an optimal weight will improve Outcome: Progressing   Problem: Skin Integrity: Goal: Risk for impaired skin integrity will decrease Outcome: Progressing

## 2023-08-28 NOTE — Discharge Summary (Addendum)
 Physician Discharge Summary  Twanna Resh FMW:[id number] DOB: [birth year] DOA: 08/27/2023  PCP: Cristopher Suzen HERO, NP  Admit date: 08/27/2023 Discharge date: 08/28/2023  Admitted From: Home Disposition: Home  Recommendations for Outpatient Follow-up:  Follow up with PCP in 1-2 weeks Please obtain BMP/CBC in one week your next doctors visit.  Zofran  as needed 2 days of p.o. potassium 3 days of PO keflex .    Discharge Condition: Stable CODE STATUS: Full code Diet recommendation: Diabetic  Brief/Interim Summary: Brief Narrative:  38 year old with history of DM1 with diabetic gastroparesis, neuropathy, chronic opioid use, prior history of DKA, chronic constipation, VZV, frequent noncompliance due to financial constraints, chronic constipation comes to [facility] with complaints of abdominal pain, nausea and vomiting.  Upon admission noted to have hyperglycemia without evidence of DKA, right upper quadrant ultrasound showed cholelithiasis with sludge but no evidence of acute cholecystitis.  Admitted to [facility] with concerns of diabetic gastroparesis and placed on skin will Reglan . Over the course of 24 hours of hospitalization patient did significantly well and was tolerating oral without any issues.  Medically stable for discharge.  Assessment & Plan:  Principal Problem:   Intractable nausea and vomiting Active Problems:   Gastroparesis     Intractable nausea vomiting secondary to diabetic gastroparesis, rule Hypokalemia She is significantly well today and would like to be discharged if she is able to tolerate p.o. lunch.  Requesting as needed Zofran  upon discharge which I will prescribe. -Replete electrolytes.   Insulin -dependent diabetes mellitus type 1 Peripheral neuropathy A1c 9.4.  Resume home insulin  regimen  Glucosuria/symptomatic bacteriuria - Glucosuria should improve with better control and her diabetes.   Reporting of urinary burning, ordered 3 days of keflex .     Cholelithiasis -Appears to be chronic.  No current evidence of acute cholecystitis on right upper quadrant ultrasound.  Will continue to monitor and should follow-up outpatient   Anxiety and depression -On Cymbalta  60 mg daily   QTc prolongation Stable   DVT prophylaxis: enoxaparin  (LOVENOX ) injection 40 mg Start: 08/27/23 2000    Code Status: Full Code Family Communication:   Will discharge today if she tolerates p.o. lunch    Subjective: Feeling well no complaints.  She would like to try regular diabetic diet and if she tolerates this she would like to be discharged which I agree.   Examination:  General exam: Appears calm and comfortable  Respiratory system: Clear to auscultation. Respiratory effort normal. Cardiovascular system: S1 & S2 heard, RRR. No JVD, murmurs, rubs, gallops or clicks. No pedal edema. Gastrointestinal system: Abdomen is nondistended, soft and nontender. No organomegaly or masses felt. Normal bowel sounds heard. Central nervous system: Alert and oriented. No focal neurological deficits. Extremities: Symmetric 5 x 5 power. Skin: No rashes, lesions or ulcers Psychiatry: Judgement and insight appear normal. Mood & affect appropriate.    Discharge Diagnoses:  Principal Problem:   Intractable nausea and vomiting Active Problems:   Gastroparesis      Discharge Exam: Vitals:   08/27/23 2135 08/28/23 0416  BP: 108/75 104/65  Pulse: (!) 110 96  Resp: 18 18  Temp: 98.6 F (37 C) 98.3 F (36.8 C)  SpO2: 100% 98%   Vitals:   08/27/23 1713 08/27/23 1853 08/27/23 2135 08/28/23 0416  BP: (!) 184/108 125/81 108/75 104/65  Pulse: (!) 115 (!) 109 (!) 110 96  Resp:   18 18  Temp: 98.2 F (36.8 C)  98.6 F (37 C) 98.3 F (36.8 C)  TempSrc:  Oral Oral  SpO2: 100%  100% 98%  Weight:      Height:          Discharge Instructions   Allergies as of 08/28/2023       Reactions   Mirapex [pramipexole] Shortness Of Breath         Medication List     TAKE these medications    Aleve 220 MG tablet Generic drug: naproxen sodium Take 220-440 mg by mouth 2 (two) times daily as needed (for headaches or pain).   B-D UF III MINI PEN NEEDLES 31G X 5 MM Misc Generic drug: Insulin  Pen Needle To use with Insulin  pens, as directed   cephALEXin  500 MG capsule Commonly known as: KEFLEX  Take 1 capsule (500 mg total) by mouth 4 (four) times daily for 3 days.   Dexcom G6 Transmitter Misc Inject 1 each into the skin every 14 (fourteen) days.   Dexcom G7 Sensor Misc Inject 1 Device into the skin See admin instructions. Place 1 new sensor into the skin every 10 days   DULoxetine  60 MG capsule Commonly known as: CYMBALTA  Take 60 mg by mouth at bedtime.   insulin  aspart 100 UNIT/ML FlexPen Commonly known as: NOVOLOG  Inject 0-9 Units into the skin 3 (three) times daily with meals. CBG < 70: Eat or drink something sweet and recheck, CBG 70 - 120: 0 units CBG 121 - 150: 1 unit CBG 151 - 200: 2 units CBG 201 - 250: 3 units CBG 251 - 300: 5 units CBG 301 - 350: 7 units CBG 351 - 400: 9 units CBG > 400: call MD. What changed:  when to take this additional instructions   insulin  aspart 100 UNIT/ML FlexPen Commonly known as: NOVOLOG  Inject 4 Units into the skin 3 (three) times daily with meals. (take 4 units Novolog  with each meal + Add correction as per sliding scale for elevated blood sugars) What changed: Another medication with the same name was changed. Make sure you understand how and when to take each.   insulin  glargine 100 UNIT/ML Solostar Pen Commonly known as: LANTUS  Inject 30 Units into the skin 2 (two) times daily.   Linzess  145 MCG Caps capsule Generic drug: linaclotide  Take 145 mcg by mouth daily as needed (for constipation).   ondansetron  4 MG disintegrating tablet Commonly known as: ZOFRAN -ODT Take 1 tablet (4 mg total) by mouth every 8 (eight) hours as needed for nausea or vomiting.   oxyCODONE  5 MG  immediate release tablet Commonly known as: Oxy IR/ROXICODONE  Take 5 mg by mouth 4 (four) times daily as needed (for pain).   potassium chloride  SA 20 MEQ tablet Commonly known as: KLOR-CON  M Take 2 tablets (40 mEq total) by mouth daily for 2 days.        Follow-up Information     Cristopher Suzen HERO, NP Follow up in 1 week(s).   Contact information: [address] [place] [address] [phone]                Allergies  Allergen Reactions   Mirapex [Pramipexole] Shortness Of Breath    You were cared for by a hospitalist during your hospital stay. If you have any questions about your discharge medications or the care you received while you were in the hospital after you are discharged, you can call the unit and asked to speak with the hospitalist on call if the hospitalist that took care of you is not available. Once you are discharged, your primary  care physician will handle any further medical issues. Please note that no refills for any discharge medications will be authorized once you are discharged, as it is imperative that you return to your primary care physician (or establish a relationship with a primary care physician if you do not have one) for your aftercare needs so that they can reassess your need for medications and monitor your lab values.  You were cared for by a hospitalist during your hospital stay. If you have any questions about your discharge medications or the care you received while you were in the hospital after you are discharged, you can call the unit and asked to speak with the hospitalist on call if the hospitalist that took care of you is not available. Once you are discharged, your primary care physician will handle any further medical issues. Please note that NO REFILLS for any discharge medications will be authorized once you are discharged, as it is imperative that you return to your primary care physician (or establish a relationship with a  primary care physician if you do not have one) for your aftercare needs so that they can reassess your need for medications and monitor your lab values.  Please request your Prim.MD to go over all Hospital Tests and Procedure/Radiological results at the follow up, please get all Hospital records sent to your Prim MD by signing hospital release before you go home.  Get CBC, CMP, 2 view Chest X ray checked  by Primary MD during your next visit or SNF MD in 5-7 days ( we routinely change or add medications that can affect your baseline labs and fluid status, therefore we recommend that you get the mentioned basic workup next visit with your PCP, your PCP may decide not to get them or add new tests based on their clinical decision)  On your next visit with your primary care physician please Get Medicines reviewed and adjusted.  If you experience worsening of your admission symptoms, develop shortness of breath, life threatening emergency, suicidal or homicidal thoughts you must seek medical attention immediately by calling 911 or calling your MD immediately  if symptoms less severe.  You Must read complete instructions/literature along with all the possible adverse reactions/side effects for all the Medicines you take and that have been prescribed to you. Take any new Medicines after you have completely understood and accpet all the possible adverse reactions/side effects.   Do not drive, operate heavy machinery, perform activities at heights, swimming or participation in water activities or provide baby sitting services if your were admitted for syncope or siezures until you have seen by Primary MD or a Neurologist and advised to do so again.  Do not drive when taking Pain medications.   Procedures/Studies: US  Abdomen Limited RUQ (LIVER/GB) Result Date: 08/27/2023 CLINICAL DATA:  Right upper quadrant abdominal pain. EXAM: ULTRASOUND ABDOMEN LIMITED RIGHT UPPER QUADRANT COMPARISON:  None Available.  FINDINGS: Gallbladder: Physiologically distended. Small volume layering sludge/calculi noted. No abnormal wall thickening or pericholecystic free fluid. Sonographic Murphy's sign was negative as per the technologist. Common bile duct: Diameter: Less than 2 mm.  No intrahepatic bile duct dilation. Liver: No focal lesion identified. Within normal limits in parenchymal echogenicity. Portal vein is patent on color Doppler imaging with normal direction of blood flow towards the liver. Other: None. IMPRESSION: *Small volume cholelithiasis/sludge without imaging signs of acute cholecystitis. Electronically Signed   By: Ree Molt M.D.   On: 08/27/2023 08:55     The results of  significant diagnostics from this hospitalization (including imaging, microbiology, ancillary and laboratory) are listed below for reference.     Microbiology: No results found for this or any previous visit (from the past 240 hours).   Labs: BNP (last 3 results) Recent Labs    04/02/23 0205  BNP 11.2   Basic Metabolic Panel: Recent Labs  Lab 08/27/23 0703 08/27/23 1905 08/28/23 0518  NA 130*  --  134*  K 3.7  --  3.0*  CL 95*  --  99  CO2 25  --  26  GLUCOSE 279*  --  157*  BUN 9  --  10  CREATININE 0.69  --  0.61  CALCIUM  9.9  --  8.7*  MG  --  1.8  --   PHOS  --  3.7  --    Liver Function Tests: Recent Labs  Lab 08/27/23 0703  AST 13*  ALT 13  ALKPHOS 85  BILITOT 0.8  PROT 8.0  ALBUMIN 4.4   Recent Labs  Lab 08/27/23 0703  LIPASE <10*   No results for input(s): AMMONIA in the last 168 hours. CBC: Recent Labs  Lab 08/27/23 0703 08/28/23 0518  WBC 10.0 8.9  HGB 13.8 11.5*  HCT 39.9 33.2*  MCV 88.9 91.7  PLT 321 277   Cardiac Enzymes: No results for input(s): CKTOTAL, CKMB, CKMBINDEX, TROPONINI in the last 168 hours. BNP: Invalid input(s): POCBNP CBG: Recent Labs  Lab 08/27/23 1959 08/27/23 2347 08/28/23 0414 08/28/23 0501 08/28/23 1204  GLUCAP 227* 164* 48* 163*  118*   D-Dimer No results for input(s): DDIMER in the last 72 hours. Hgb A1c Recent Labs    08/27/23 1905  HGBA1C 9.4*   Lipid Profile No results for input(s): CHOL, HDL, LDLCALC, TRIG, CHOLHDL, LDLDIRECT in the last 72 hours. Thyroid  function studies No results for input(s): TSH, T4TOTAL, T3FREE, THYROIDAB in the last 72 hours.  Invalid input(s): FREET3 Anemia work up No results for input(s): VITAMINB12, FOLATE, FERRITIN, TIBC, IRON, RETICCTPCT in the last 72 hours. Urinalysis    Component Value Date/Time   COLORURINE YELLOW 08/27/2023 1153   APPEARANCEUR HAZY (A) 08/27/2023 1153   LABSPEC 1.017 08/27/2023 1153   PHURINE 6.0 08/27/2023 1153   GLUCOSEU >1,000 (A) 08/27/2023 1153   HGBUR SMALL (A) 08/27/2023 1153   BILIRUBINUR NEGATIVE 08/27/2023 1153   BILIRUBINUR negative 11/08/2021 1424   KETONESUR >80 (A) 08/27/2023 1153   PROTEINUR 30 (A) 08/27/2023 1153   UROBILINOGEN 0.2 11/08/2021 1424   UROBILINOGEN 0.2 05/03/2021 0837   NITRITE NEGATIVE 08/27/2023 1153   LEUKOCYTESUR NEGATIVE 08/27/2023 1153   Sepsis Labs Recent Labs  Lab 08/27/23 0703 08/28/23 0518  WBC 10.0 8.9   Microbiology No results found for this or any previous visit (from the past 240 hours).   Time coordinating discharge:  I have spent 35 minutes face to face with the patient and on the ward discussing the patients care, assessment, plan and disposition with other care givers. >50% of the time was devoted counseling the patient about the risks and benefits of treatment/Discharge disposition and coordinating care.   SIGNED:   Burgess JAYSON Dare, MD  [facility] 08/28/2023, 12:51 PM   If 7PM-7AM, please contact night-coverage

## 2023-08-28 NOTE — Progress Notes (Addendum)
 Hypoglycemic Event  CBG: 48  Treatment: D50 50 mL (25 gm)  Symptoms: None  Follow-up CBG: Time:450 CBG Result:163  Possible Reasons for Event: Inadequate meal intake and Other: pt is NPO  Comments/MD notified:Abigail Andrez / NP    Anisten Tomassi

## 2023-10-09 ENCOUNTER — Other Ambulatory Visit: Payer: Self-pay

## 2023-10-09 ENCOUNTER — Emergency Department (HOSPITAL_BASED_OUTPATIENT_CLINIC_OR_DEPARTMENT_OTHER)
Admission: EM | Admit: 2023-10-09 | Discharge: 2023-10-09 | Disposition: A | Discharge status: Home / Self Care | Payer: BC Managed Care – PPO | Source: Home / Self Care | Attending: Emergency Medicine | Admitting: Emergency Medicine

## 2023-10-09 ENCOUNTER — Encounter (HOSPITAL_BASED_OUTPATIENT_CLINIC_OR_DEPARTMENT_OTHER): Payer: Self-pay | Admitting: Emergency Medicine

## 2023-10-09 DIAGNOSIS — E1010 Type 1 diabetes mellitus with ketoacidosis without coma: Secondary | ICD-10-CM | POA: Diagnosis not present

## 2023-10-09 DIAGNOSIS — R059 Cough, unspecified: Secondary | ICD-10-CM | POA: Insufficient documentation

## 2023-10-09 DIAGNOSIS — E1165 Type 2 diabetes mellitus with hyperglycemia: Secondary | ICD-10-CM | POA: Insufficient documentation

## 2023-10-09 DIAGNOSIS — Z20822 Contact with and (suspected) exposure to covid-19: Secondary | ICD-10-CM | POA: Insufficient documentation

## 2023-10-09 DIAGNOSIS — R739 Hyperglycemia, unspecified: Secondary | ICD-10-CM

## 2023-10-09 DIAGNOSIS — Z794 Long term (current) use of insulin: Secondary | ICD-10-CM | POA: Insufficient documentation

## 2023-10-09 DIAGNOSIS — Z79899 Other long term (current) drug therapy: Secondary | ICD-10-CM | POA: Insufficient documentation

## 2023-10-09 LAB — COMPREHENSIVE METABOLIC PANEL
ALT: 5 U/L (ref 0–44)
AST: 8 U/L — ABNORMAL LOW (ref 15–41)
Albumin: 4.2 g/dL (ref 3.5–5.0)
Alkaline Phosphatase: 106 U/L (ref 38–126)
Anion gap: 13 (ref 5–15)
BUN: 13 mg/dL (ref 6–20)
CO2: 26 mmol/L (ref 22–32)
Calcium: 9.7 mg/dL (ref 8.9–10.3)
Chloride: 88 mmol/L — ABNORMAL LOW (ref 98–111)
Creatinine, Ser: 0.89 mg/dL (ref 0.44–1.00)
GFR, Estimated: >60 mL/min (ref >60)
Glucose, Bld: 479 mg/dL — ABNORMAL HIGH (ref 70–99)
Potassium: 4.8 mmol/L (ref 3.5–5.1)
Sodium: 127 mmol/L — ABNORMAL LOW (ref 135–145)
Total Bilirubin: 0.5 mg/dL (ref 0.0–1.2)
Total Protein: 8.7 g/dL — ABNORMAL HIGH (ref 6.5–8.1)

## 2023-10-09 LAB — I-STAT VENOUS BLOOD GAS, ED
Acid-Base Excess: 6 mmol/L — ABNORMAL HIGH (ref 0.0–2.0)
Bicarbonate: 32.4 mmol/L — ABNORMAL HIGH (ref 20.0–28.0)
Calcium, Ion: 1.18 mmol/L (ref 1.15–1.40)
HCT: 38 % (ref 36.0–46.0)
Hemoglobin: 12.9 g/dL (ref 12.0–15.0)
O2 Saturation: 29 %
Patient temperature: 98.3
Potassium: 5.6 mmol/L — ABNORMAL HIGH (ref 3.5–5.1)
Sodium: 128 mmol/L — ABNORMAL LOW (ref 135–145)
TCO2: 34 mmol/L — ABNORMAL HIGH (ref 22–32)
pCO2, Ven: 52.8 mm[Hg] (ref 44–60)
pH, Ven: 7.396 (ref 7.25–7.43)
pO2, Ven: 19 mm[Hg] — CL (ref 32–45)

## 2023-10-09 LAB — PREGNANCY, URINE: Preg Test, Ur: NEGATIVE

## 2023-10-09 LAB — DIFFERENTIAL
Abs Immature Granulocytes: 0.02 10*3/uL (ref 0.00–0.07)
Basophils Absolute: 0.1 10*3/uL (ref 0.0–0.1)
Basophils Relative: 1 %
Eosinophils Absolute: 0.2 10*3/uL (ref 0.0–0.5)
Eosinophils Relative: 2 %
Immature Granulocytes: 0 %
Lymphocytes Relative: 23 %
Lymphs Abs: 2.3 10*3/uL (ref 0.7–4.0)
Monocytes Absolute: 0.5 10*3/uL (ref 0.1–1.0)
Monocytes Relative: 5 %
Neutro Abs: 6.9 10*3/uL (ref 1.7–7.7)
Neutrophils Relative %: 69 %

## 2023-10-09 LAB — CBC
HCT: 38.8 % (ref 36.0–46.0)
Hemoglobin: 13.3 g/dL (ref 12.0–15.0)
MCH: 30.6 pg (ref 26.0–34.0)
MCHC: 34.3 g/dL (ref 30.0–36.0)
MCV: 89.4 fL (ref 80.0–100.0)
Platelets: 393 10*3/uL (ref 150–400)
RBC: 4.34 MIL/uL (ref 3.87–5.11)
RDW: 12.9 % (ref 11.5–15.5)
WBC: 9.9 10*3/uL (ref 4.0–10.5)
nRBC: 0 % (ref 0.0–0.2)

## 2023-10-09 LAB — CBG MONITORING, ED
Glucose-Capillary: 424 mg/dL — ABNORMAL HIGH (ref 70–99)
Glucose-Capillary: 392 mg/dL — ABNORMAL HIGH (ref 70–99)
Glucose-Capillary: 378 mg/dL — ABNORMAL HIGH (ref 70–99)
Glucose-Capillary: 241 mg/dL — ABNORMAL HIGH (ref 70–99)

## 2023-10-09 LAB — RESP PANEL BY RT-PCR (RSV, FLU A&B, COVID)  RVPGX2
Influenza A by PCR: NEGATIVE
Influenza B by PCR: NEGATIVE
Resp Syncytial Virus by PCR: NEGATIVE
SARS Coronavirus 2 by RT PCR: NEGATIVE

## 2023-10-09 MED ORDER — SODIUM CHLORIDE 0.9 % IV BOLUS
1000.0000 mL | Freq: Once | INTRAVENOUS | Status: AC
  Administered 2023-10-09: 1000 mL via INTRAVENOUS

## 2023-10-09 MED ORDER — MAGNESIUM SULFATE 2 GM/50ML IV SOLN
2.0000 g | Freq: Once | INTRAVENOUS | Status: AC
  Administered 2023-10-09: 2 g via INTRAVENOUS
  Filled 2023-10-09: qty 50

## 2023-10-09 MED ORDER — LACTATED RINGERS IV BOLUS
2000.0000 mL | Freq: Once | INTRAVENOUS | Status: AC
  Administered 2023-10-09: 2000 mL via INTRAVENOUS

## 2023-10-09 MED ORDER — INSULIN ASPART 100 UNIT/ML IJ SOLN
10.0000 [IU] | Freq: Once | INTRAMUSCULAR | Status: AC
  Administered 2023-10-09: 10 [IU] via SUBCUTANEOUS

## 2023-10-09 MED ORDER — KETOROLAC TROMETHAMINE 15 MG/ML IJ SOLN
15.0000 mg | Freq: Once | INTRAMUSCULAR | Status: AC
  Administered 2023-10-09: 15 mg via INTRAVENOUS
  Filled 2023-10-09: qty 1

## 2023-10-09 MED ORDER — INSULIN REGULAR HUMAN 100 UNIT/ML IJ SOLN: 10.0000 [IU] | Freq: Once | INTRAMUSCULAR | Status: DC

## 2023-10-09 MED ORDER — INSULIN ASPART 100 UNIT/ML IJ SOLN
8.0000 [IU] | INTRAMUSCULAR | Status: AC
  Administered 2023-10-09: 8 [IU] via SUBCUTANEOUS

## 2023-10-09 MED ORDER — ACETAMINOPHEN 500 MG PO TABS
1000.0000 mg | ORAL_TABLET | Freq: Once | ORAL | Status: AC
  Administered 2023-10-09: 1000 mg via ORAL
  Filled 2023-10-09: qty 2

## 2023-10-09 MED ORDER — METOCLOPRAMIDE HCL 5 MG/ML IJ SOLN
10.0000 mg | Freq: Once | INTRAMUSCULAR | Status: AC
  Administered 2023-10-09: 10 mg via INTRAVENOUS
  Filled 2023-10-09: qty 2

## 2023-10-09 MED ORDER — DIPHENHYDRAMINE HCL 25 MG PO CAPS
25.0000 mg | ORAL_CAPSULE | Freq: Once | ORAL | Status: AC
  Administered 2023-10-09: 25 mg via ORAL
  Filled 2023-10-09: qty 1

## 2023-10-09 NOTE — ED Notes (Signed)
Patient stated she had to leave immediately for a family emergency and requested IV be taken out. EDP notified and came to bedside to speak to pt.

## 2023-10-09 NOTE — ED Provider Notes (Signed)
[place] EMERGENCY DEPARTMENT AT [facility] Provider Note   CSN: [id number] Arrival date & time: 10/09/23  1038     History  Chief Complaint  Patient presents with   Hyperglycemia    [name] is a 38 y.o. female.   Hyperglycemia  Patient is a 38 year old female with past medical history significant for diabetes, chronic pain, nausea vomiting, DKA, difficulty with managing her diabetes, chronic constipation  Patient is here with sinus congestion, cough, chills, aches, headaches. Says she's been feeling unwell for a few days. States she hasn't been eating very much and also not using insulin. No CP or SOB. States she has a headache.  Frontal and achy constant headache without associated vision changes, etc.       Home Medications Prior to Admission medications   Medication Sig Start Date End Date Taking? Authorizing Provider  ALEVE 220 MG tablet Take 220-440 mg by mouth 2 (two) times daily as needed (for headaches or pain).    [provider]  Continuous Blood Gluc Transmit (DEXCOM G6 TRANSMITTER) MISC Inject 1 each into the skin every 14 (fourteen) days. 12/31/21   Dulce Sellar, NP  Continuous Glucose Sensor (DEXCOM G7 SENSOR) MISC Inject 1 Device into the skin See admin instructions. Place 1 new sensor into the skin every 10 days    [provider]  DULoxetine (CYMBALTA) 60 MG capsule Take 60 mg by mouth at bedtime. 06/28/22   [provider]  insulin aspart (NOVOLOG) 100 UNIT/ML FlexPen Inject 0-9 Units into the skin 3 (three) times daily with meals. CBG < 70: Eat or drink something sweet and recheck, CBG 70 - 120: 0 units CBG 121 - 150: 1 unit CBG 151 - 200: 2 units CBG 201 - 250: 3 units CBG 251 - 300: 5 units CBG 301 - 350: 7 units CBG 351 - 400: 9 units CBG > 400: call MD. Patient taking differently: Inject 0-9 Units into the skin See admin instructions. Inject 0-9 units into the skin 3 times a day with meals, PER  SLIDING SCALE: "CBG < 70: Eat or drink something sweet and recheck, CBG 70 - 120: 0 units CBG 121 - 150: 1 unit CBG 151 - 200: 2 units CBG 201 - 250: 3 units CBG 251 - 300: 5 units CBG 301 - 350: 7 units CBG 351 - 400: 9 units CBG > 400: call MD" 08/07/23 11/05/23  Elease Etienne, MD  insulin aspart (NOVOLOG) 100 UNIT/ML FlexPen Inject 4 Units into the skin 3 (three) times daily with meals. (take 4 units Novolog with each meal + Add correction as per sliding scale for elevated blood sugars) Patient not taking: Reported on 08/27/2023 08/07/23   Elease Etienne, MD  insulin glargine (LANTUS) 100 UNIT/ML Solostar Pen Inject 30 Units into the skin 2 (two) times daily. 08/07/23   Hongalgi, Maximino Green[place], MD  Insulin Pen Needle (B-D UF III MINI PEN NEEDLES) 31G X 5 MM MISC To use with Insulin pens, as directed 08/07/23   Elease Etienne, MD  linaclotide (LINZESS) 145 MCG CAPS capsule Take 145 mcg by mouth daily as needed (for constipation).    [provider]  ondansetron (ZOFRAN-ODT) 4 MG disintegrating tablet Take 1 tablet (4 mg total) by mouth every 8 (eight) hours as needed for nausea or vomiting. 08/28/23   Amin, Ankit C, MD  oxyCODONE (OXY IR/ROXICODONE) 5 MG immediate release tablet Take 5 mg by mouth 4 (four) times daily as needed (  for pain).    [provider]  potassium chloride SA (KLOR-CON M) 20 MEQ tablet Take 2 tablets (40 mEq total) by mouth daily for 2 days. 08/28/23 08/30/23  Miguel Rota, MD      Allergies    Mirapex [pramipexole]    Review of Systems   Review of Systems  Physical Exam Updated Vital Signs BP 133/85   Pulse 100   Temp 98.2 F (36.8 C) (Oral)   Resp 10   LMP 10/04/2023   SpO2 100%  Physical Exam Vitals and nursing note reviewed.  Constitutional:      General: She is not in acute distress. HENT:     Head: Normocephalic and atraumatic.     Nose: Nose normal.     Mouth/Throat:     Mouth: Mucous membranes are dry.  Eyes:     General: No scleral  icterus. Cardiovascular:     Rate and Rhythm: Normal rate and regular rhythm.     Pulses: Normal pulses.     Heart sounds: Normal heart sounds.  Pulmonary:     Effort: Pulmonary effort is normal. No respiratory distress.     Breath sounds: No wheezing.  Abdominal:     Palpations: Abdomen is soft.     Tenderness: There is no abdominal tenderness. There is no guarding or rebound.  Musculoskeletal:     Cervical back: Normal range of motion.     Right lower leg: No edema.     Left lower leg: No edema.  Skin:    General: Skin is warm and dry.     Capillary Refill: Capillary refill takes less than 2 seconds.  Neurological:     Mental Status: She is alert. Mental status is at baseline.  Psychiatric:        Mood and Affect: Mood normal.        Behavior: Behavior normal.     ED Results / Procedures / Treatments   Labs (all labs ordered are listed, but only abnormal results are displayed) Labs Reviewed  COMPREHENSIVE METABOLIC PANEL - Abnormal; Notable for the following components:      Result Value   Sodium 127 (*)    Chloride 88 (*)    Glucose, Bld 479 (*)    Total Protein 8.7 (*)    AST 8 (*)    All other components within normal limits  CBG MONITORING, ED - Abnormal; Notable for the following components:   Glucose-Capillary 424 (*)    All other components within normal limits  I-STAT VENOUS BLOOD GAS, ED - Abnormal; Notable for the following components:   pO2, Ven 19 (*)    Bicarbonate 32.4 (*)    TCO2 34 (*)    Acid-Base Excess 6.0 (*)    Sodium 128 (*)    Potassium 5.6 (*)    All other components within normal limits  CBG MONITORING, ED - Abnormal; Notable for the following components:   Glucose-Capillary 392 (*)    All other components within normal limits  CBG MONITORING, ED - Abnormal; Notable for the following components:   Glucose-Capillary 378 (*)    All other components within normal limits  CBG MONITORING, ED - Abnormal; Notable for the following components:    Glucose-Capillary 241 (*)    All other components within normal limits  RESP PANEL BY RT-PCR (RSV, FLU A&B, COVID)  RVPGX2  PREGNANCY, URINE  CBC  DIFFERENTIAL  CBC WITH DIFFERENTIAL/PLATELET    EKG None  Radiology No results found.  Procedures Procedures    Medications Ordered in ED Medications  lactated ringers bolus 2,000 mL (0 mLs Intravenous Stopped 10/09/23 1308)  acetaminophen (TYLENOL) tablet 1,000 mg (1,000 mg Oral Given 10/09/23 1136)  ketorolac (TORADOL) 15 MG/ML injection 15 mg (15 mg Intravenous Given 10/09/23 1241)  insulin aspart (novoLOG) injection 10 Units (10 Units Subcutaneous Given 10/09/23 1249)  sodium chloride 0.9 % bolus 1,000 mL (0 mLs Intravenous Stopped 10/09/23 1436)  magnesium sulfate IVPB 2 g 50 mL (0 g Intravenous Stopped 10/09/23 1436)  insulin aspart (novoLOG) injection 8 Units (8 Units Subcutaneous Given 10/09/23 1354)  metoCLOPramide (REGLAN) injection 10 mg (10 mg Intravenous Given 10/09/23 1354)  diphenhydrAMINE (BENADRYL) capsule 25 mg (25 mg Oral Given 10/09/23 1351)    ED Course/ Medical Decision Making/ A&P Clinical Course as of 10/09/23 1523  Thu Oct 09, 2023  1112 Cough, congestion, chills, aches, headache. [WF]  1112 10-15U ssi   [WF]    Clinical Course User Index [WF] Gailen Shelter, PA                                 Medical Decision Making Amount and/or Complexity of Data Reviewed Labs: ordered.  Risk OTC drugs. Prescription drug management.   Patient is a 38 year old female with past medical history significant for diabetes, chronic pain, nausea vomiting, DKA, difficulty with managing her diabetes, chronic constipation  Patient is here with sinus congestion, cough, chills, aches, headaches. Says she's been feeling unwell for a few days. States she hasn't been eating very much and also not using insulin. No CP or SOB. States she has a headache.  Frontal and achy constant headache without associated vision changes, etc.    PE: unremarkable  CMP w Na corrects to normal (133), CMP also with blood sugar of 479 improved to 241 with fluids and insulin.  CBC unremarkable urine pregnancy negative VBG without acidosis RSV passive negative   Patient feels improved after magnesium, Reglan, Benadryl, Toradol, Tylenol, 3 L of IV fluids, insulin for hyperglycemia.  She feels much improved and is tolerating p.o. ambulatory and her blood sugars improved here.  Will discharge home.  Return precautions discussed.  She will need to continue her home insulin regimen.  Final Clinical Impression(s) / ED Diagnoses Final diagnoses:  Hyperglycemia    Rx / DC Orders ED Discharge Orders     None         Gailen Shelter, [place] 10/09/23 1602    Rozelle Logan, DO 10/10/23 6045

## 2023-10-09 NOTE — Discharge Instructions (Signed)
Please continue taking your insulin, hydrate, eat regular meals.  Follow-up with your primary care doctor.

## 2023-10-09 NOTE — ED Triage Notes (Signed)
Pt c/o flu like symptoms, congestion and cough and fever, also reports hyperglycemia

## 2023-10-12 ENCOUNTER — Inpatient Hospital Stay (HOSPITAL_BASED_OUTPATIENT_CLINIC_OR_DEPARTMENT_OTHER)
Admission: EM | Admit: 2023-10-12 | Discharge: 2023-10-15 | DRG: 637 | Disposition: A | Discharge status: Home / Self Care | Payer: BC Managed Care – PPO | Attending: Internal Medicine | Admitting: Internal Medicine

## 2023-10-12 ENCOUNTER — Encounter (HOSPITAL_BASED_OUTPATIENT_CLINIC_OR_DEPARTMENT_OTHER): Payer: Self-pay

## 2023-10-12 ENCOUNTER — Emergency Department (HOSPITAL_BASED_OUTPATIENT_CLINIC_OR_DEPARTMENT_OTHER): Payer: BC Managed Care – PPO

## 2023-10-12 ENCOUNTER — Other Ambulatory Visit: Payer: Self-pay

## 2023-10-12 DIAGNOSIS — Z83511 Family history of glaucoma: Secondary | ICD-10-CM | POA: Diagnosis not present

## 2023-10-12 DIAGNOSIS — J329 Chronic sinusitis, unspecified: Secondary | ICD-10-CM | POA: Diagnosis present

## 2023-10-12 DIAGNOSIS — G894 Chronic pain syndrome: Secondary | ICD-10-CM | POA: Diagnosis present

## 2023-10-12 DIAGNOSIS — E1100 Type 2 diabetes mellitus with hyperosmolarity without nonketotic hyperglycemic-hyperosmolar coma (NKHHC): Principal | ICD-10-CM

## 2023-10-12 DIAGNOSIS — Z1152 Encounter for screening for COVID-19: Secondary | ICD-10-CM

## 2023-10-12 DIAGNOSIS — E10649 Type 1 diabetes mellitus with hypoglycemia without coma: Secondary | ICD-10-CM | POA: Diagnosis present

## 2023-10-12 DIAGNOSIS — D75839 Thrombocytosis, unspecified: Secondary | ICD-10-CM | POA: Diagnosis present

## 2023-10-12 DIAGNOSIS — Z79891 Long term (current) use of opiate analgesic: Secondary | ICD-10-CM | POA: Diagnosis not present

## 2023-10-12 DIAGNOSIS — K3184 Gastroparesis: Secondary | ICD-10-CM | POA: Diagnosis present

## 2023-10-12 DIAGNOSIS — E1043 Type 1 diabetes mellitus with diabetic autonomic (poly)neuropathy: Secondary | ICD-10-CM | POA: Diagnosis present

## 2023-10-12 DIAGNOSIS — Z794 Long term (current) use of insulin: Secondary | ICD-10-CM

## 2023-10-12 DIAGNOSIS — E0811 Diabetes mellitus due to underlying condition with ketoacidosis with coma: Secondary | ICD-10-CM | POA: Diagnosis not present

## 2023-10-12 DIAGNOSIS — E860 Dehydration: Secondary | ICD-10-CM | POA: Diagnosis present

## 2023-10-12 DIAGNOSIS — E1042 Type 1 diabetes mellitus with diabetic polyneuropathy: Secondary | ICD-10-CM | POA: Diagnosis present

## 2023-10-12 DIAGNOSIS — Z5986 Financial insecurity: Secondary | ICD-10-CM | POA: Diagnosis not present

## 2023-10-12 DIAGNOSIS — F419 Anxiety disorder, unspecified: Secondary | ICD-10-CM | POA: Diagnosis present

## 2023-10-12 DIAGNOSIS — E1010 Type 1 diabetes mellitus with ketoacidosis without coma: Secondary | ICD-10-CM | POA: Diagnosis present

## 2023-10-12 DIAGNOSIS — Z8249 Family history of ischemic heart disease and other diseases of the circulatory system: Secondary | ICD-10-CM

## 2023-10-12 DIAGNOSIS — Z79899 Other long term (current) drug therapy: Secondary | ICD-10-CM

## 2023-10-12 DIAGNOSIS — E1011 Type 1 diabetes mellitus with ketoacidosis with coma: Secondary | ICD-10-CM | POA: Diagnosis not present

## 2023-10-12 DIAGNOSIS — G9341 Metabolic encephalopathy: Secondary | ICD-10-CM | POA: Diagnosis present

## 2023-10-12 DIAGNOSIS — E1110 Type 2 diabetes mellitus with ketoacidosis without coma: Secondary | ICD-10-CM | POA: Diagnosis present

## 2023-10-12 DIAGNOSIS — F1290 Cannabis use, unspecified, uncomplicated: Secondary | ICD-10-CM | POA: Diagnosis present

## 2023-10-12 DIAGNOSIS — F32A Depression, unspecified: Secondary | ICD-10-CM | POA: Diagnosis present

## 2023-10-12 DIAGNOSIS — Z91148 Patient's other noncompliance with medication regimen for other reason: Secondary | ICD-10-CM | POA: Diagnosis not present

## 2023-10-12 DIAGNOSIS — Z833 Family history of diabetes mellitus: Secondary | ICD-10-CM | POA: Diagnosis not present

## 2023-10-12 DIAGNOSIS — Z888 Allergy status to other drugs, medicaments and biological substances status: Secondary | ICD-10-CM

## 2023-10-12 LAB — HEPATIC FUNCTION PANEL
ALT: <5 U/L (ref 0–44)
AST: 9 U/L — ABNORMAL LOW (ref 15–41)
Albumin: 4.7 g/dL (ref 3.5–5.0)
Alkaline Phosphatase: 98 U/L (ref 38–126)
Bilirubin, Direct: 0.1 mg/dL (ref 0.0–0.2)
Indirect Bilirubin: 0.4 mg/dL (ref 0.3–0.9)
Total Bilirubin: 0.5 mg/dL (ref 0.0–1.2)
Total Protein: 9.6 g/dL — ABNORMAL HIGH (ref 6.5–8.1)

## 2023-10-12 LAB — RAPID URINE DRUG SCREEN, HOSP PERFORMED
Amphetamines: NOT DETECTED
Barbiturates: NOT DETECTED
Benzodiazepines: NOT DETECTED
Cocaine: NOT DETECTED
Opiates: POSITIVE — AB
Tetrahydrocannabinol: POSITIVE — AB

## 2023-10-12 LAB — BASIC METABOLIC PANEL
Anion gap: 24 — ABNORMAL HIGH (ref 5–15)
Anion gap: 14 (ref 5–15)
Anion gap: 9 (ref 5–15)
BUN: 11 mg/dL (ref 6–20)
BUN: 10 mg/dL (ref 6–20)
BUN: 9 mg/dL (ref 6–20)
CO2: 17 mmol/L — ABNORMAL LOW (ref 22–32)
CO2: 24 mmol/L (ref 22–32)
CO2: 28 mmol/L (ref 22–32)
Calcium: 10.8 mg/dL — ABNORMAL HIGH (ref 8.9–10.3)
Calcium: 9.3 mg/dL (ref 8.9–10.3)
Calcium: 9.2 mg/dL (ref 8.9–10.3)
Chloride: 89 mmol/L — ABNORMAL LOW (ref 98–111)
Chloride: 98 mmol/L (ref 98–111)
Chloride: 100 mmol/L (ref 98–111)
Creatinine, Ser: 0.89 mg/dL (ref 0.44–1.00)
Creatinine, Ser: 0.72 mg/dL (ref 0.44–1.00)
Creatinine, Ser: 0.68 mg/dL (ref 0.44–1.00)
GFR, Estimated: >60 mL/min (ref >60)
GFR, Estimated: >60 mL/min (ref >60)
GFR, Estimated: >60 mL/min (ref >60)
Glucose, Bld: 427 mg/dL — ABNORMAL HIGH (ref 70–99)
Glucose, Bld: 168 mg/dL — ABNORMAL HIGH (ref 70–99)
Glucose, Bld: 121 mg/dL — ABNORMAL HIGH (ref 70–99)
Potassium: 5.1 mmol/L (ref 3.5–5.1)
Potassium: 3.7 mmol/L (ref 3.5–5.1)
Potassium: 3.6 mmol/L (ref 3.5–5.1)
Sodium: 130 mmol/L — ABNORMAL LOW (ref 135–145)
Sodium: 136 mmol/L (ref 135–145)
Sodium: 137 mmol/L (ref 135–145)

## 2023-10-12 LAB — CBC
HCT: 36.3 % (ref 36.0–46.0)
Hemoglobin: 11.9 g/dL — ABNORMAL LOW (ref 12.0–15.0)
MCH: 29.7 pg (ref 26.0–34.0)
MCHC: 32.8 g/dL (ref 30.0–36.0)
MCV: 90.5 fL (ref 80.0–100.0)
Platelets: 419 10*3/uL — ABNORMAL HIGH (ref 150–400)
RBC: 4.01 MIL/uL (ref 3.87–5.11)
RDW: 13 % (ref 11.5–15.5)
WBC: 8.7 10*3/uL (ref 4.0–10.5)
nRBC: 0 % (ref 0.0–0.2)

## 2023-10-12 LAB — MRSA NEXT GEN BY PCR, NASAL: MRSA by PCR Next Gen: DETECTED — AB

## 2023-10-12 LAB — URINALYSIS, ROUTINE W REFLEX MICROSCOPIC
Bacteria, UA: NONE SEEN
Bilirubin Urine: NEGATIVE
Glucose, UA: >1000 mg/dL — AB
Ketones, ur: >80 mg/dL — AB
Leukocytes,Ua: NEGATIVE
Nitrite: NEGATIVE
Protein, ur: 100 mg/dL — AB
Specific Gravity, Urine: 1.03 (ref 1.005–1.030)
pH: 5.5 (ref 5.0–8.0)

## 2023-10-12 LAB — I-STAT VENOUS BLOOD GAS, ED
Acid-base deficit: 1 mmol/L (ref 0.0–2.0)
Bicarbonate: 24.5 mmol/L (ref 20.0–28.0)
Calcium, Ion: 1.24 mmol/L (ref 1.15–1.40)
HCT: 44 % (ref 36.0–46.0)
Hemoglobin: 15 g/dL (ref 12.0–15.0)
O2 Saturation: 30 %
Potassium: 4.9 mmol/L (ref 3.5–5.1)
Sodium: 131 mmol/L — ABNORMAL LOW (ref 135–145)
TCO2: 26 mmol/L (ref 22–32)
pCO2, Ven: 43.8 mm[Hg] — ABNORMAL LOW (ref 44–60)
pH, Ven: 7.356 (ref 7.25–7.43)
pO2, Ven: 20 mm[Hg] — CL (ref 32–45)

## 2023-10-12 LAB — CBC WITH DIFFERENTIAL/PLATELET
Abs Immature Granulocytes: 0.03 10*3/uL (ref 0.00–0.07)
Basophils Absolute: 0 10*3/uL (ref 0.0–0.1)
Basophils Relative: 1 %
Eosinophils Absolute: 0 10*3/uL (ref 0.0–0.5)
Eosinophils Relative: 1 %
HCT: 41.3 % (ref 36.0–46.0)
Hemoglobin: 14 g/dL (ref 12.0–15.0)
Immature Granulocytes: 0 %
Lymphocytes Relative: 29 %
Lymphs Abs: 2.3 10*3/uL (ref 0.7–4.0)
MCH: 30.3 pg (ref 26.0–34.0)
MCHC: 33.9 g/dL (ref 30.0–36.0)
MCV: 89.4 fL (ref 80.0–100.0)
Monocytes Absolute: 0.3 10*3/uL (ref 0.1–1.0)
Monocytes Relative: 4 %
Neutro Abs: 5.3 10*3/uL (ref 1.7–7.7)
Neutrophils Relative %: 65 %
Platelets: 498 10*3/uL — ABNORMAL HIGH (ref 150–400)
RBC: 4.62 MIL/uL (ref 3.87–5.11)
RDW: 13.1 % (ref 11.5–15.5)
WBC: 7.8 10*3/uL (ref 4.0–10.5)
nRBC: 0 % (ref 0.0–0.2)

## 2023-10-12 LAB — CBG MONITORING, ED
Glucose-Capillary: 399 mg/dL — ABNORMAL HIGH (ref 70–99)
Glucose-Capillary: 238 mg/dL — ABNORMAL HIGH (ref 70–99)
Glucose-Capillary: 151 mg/dL — ABNORMAL HIGH (ref 70–99)
Glucose-Capillary: 138 mg/dL — ABNORMAL HIGH (ref 70–99)

## 2023-10-12 LAB — GLUCOSE, CAPILLARY
Glucose-Capillary: 157 mg/dL — ABNORMAL HIGH (ref 70–99)
Glucose-Capillary: 155 mg/dL — ABNORMAL HIGH (ref 70–99)
Glucose-Capillary: 120 mg/dL — ABNORMAL HIGH (ref 70–99)

## 2023-10-12 LAB — BETA-HYDROXYBUTYRIC ACID
Beta-Hydroxybutyric Acid: 6.56 mmol/L — ABNORMAL HIGH (ref 0.05–0.27)
Beta-Hydroxybutyric Acid: 2.94 mmol/L — ABNORMAL HIGH (ref 0.05–0.27)
Beta-Hydroxybutyric Acid: 1 mmol/L — ABNORMAL HIGH (ref 0.05–0.27)

## 2023-10-12 LAB — OSMOLALITY: Osmolality: 314 mosm/kg — ABNORMAL HIGH (ref 275–295)

## 2023-10-12 LAB — PREGNANCY, URINE: Preg Test, Ur: NEGATIVE

## 2023-10-12 LAB — ETHANOL: Alcohol, Ethyl (B): <10 mg/dL (ref <10)

## 2023-10-12 LAB — PHOSPHORUS: Phosphorus: 1.9 mg/dL — ABNORMAL LOW (ref 2.5–4.6)

## 2023-10-12 LAB — MAGNESIUM: Magnesium: 1.8 mg/dL (ref 1.7–2.4)

## 2023-10-12 LAB — LACTIC ACID, PLASMA: Lactic Acid, Venous: 1.7 mmol/L (ref 0.5–1.9)

## 2023-10-12 LAB — HCG, QUANTITATIVE, PREGNANCY: hCG, Beta Chain, Quant, S: <1 m[IU]/mL (ref <5)

## 2023-10-12 MED ORDER — LACTATED RINGERS IV SOLN: INTRAVENOUS | Status: AC

## 2023-10-12 MED ORDER — INSULIN REGULAR(HUMAN) IN NACL 100-0.9 UT/100ML-% IV SOLN
INTRAVENOUS | Status: AC
  Administered 2023-10-12: 3 [IU]/h via INTRAVENOUS

## 2023-10-12 MED ORDER — INSULIN GLARGINE 100 UNIT/ML ~~LOC~~ SOLN
10.0000 [IU] | Freq: Two times a day (BID) | SUBCUTANEOUS | Status: DC
  Administered 2023-10-12 – 2023-10-13 (×2): 10 [IU] via SUBCUTANEOUS
  Filled 2023-10-12 (×2): qty 0.1

## 2023-10-12 MED ORDER — ONDANSETRON HCL 4 MG/2ML IJ SOLN: 4.0000 mg | Freq: Once | INTRAMUSCULAR | Status: DC

## 2023-10-12 MED ORDER — LACTATED RINGERS IV BOLUS
20.0000 mL/kg | Freq: Once | INTRAVENOUS | Status: AC
  Administered 2023-10-12: 1480 mL via INTRAVENOUS

## 2023-10-12 MED ORDER — ONDANSETRON HCL 4 MG/2ML IJ SOLN
4.0000 mg | Freq: Once | INTRAMUSCULAR | Status: AC
  Administered 2023-10-12: 4 mg via INTRAVENOUS
  Filled 2023-10-12: qty 2

## 2023-10-12 MED ORDER — METOCLOPRAMIDE HCL 5 MG/ML IJ SOLN
10.0000 mg | Freq: Once | INTRAMUSCULAR | Status: AC
  Administered 2023-10-12: 10 mg via INTRAVENOUS
  Filled 2023-10-12: qty 2

## 2023-10-12 MED ORDER — PROCHLORPERAZINE EDISYLATE 10 MG/2ML IJ SOLN
5.0000 mg | Freq: Four times a day (QID) | INTRAMUSCULAR | Status: DC | PRN
  Administered 2023-10-12 – 2023-10-14 (×6): 5 mg via INTRAVENOUS
  Filled 2023-10-12 (×6): qty 2

## 2023-10-12 MED ORDER — OXYCODONE HCL 5 MG PO TABS
5.0000 mg | ORAL_TABLET | Freq: Four times a day (QID) | ORAL | Status: DC | PRN
  Administered 2023-10-12 – 2023-10-15 (×6): 5 mg via ORAL
  Filled 2023-10-12 (×6): qty 1

## 2023-10-12 MED ORDER — DEXTROSE IN LACTATED RINGERS 5 % IV SOLN: INTRAVENOUS | Status: AC

## 2023-10-12 MED ORDER — MELATONIN 5 MG PO TABS
5.0000 mg | ORAL_TABLET | Freq: Every evening | ORAL | Status: DC | PRN
  Administered 2023-10-12 – 2023-10-15 (×3): 5 mg via ORAL
  Filled 2023-10-12 (×3): qty 1

## 2023-10-12 MED ORDER — POLYETHYLENE GLYCOL 3350 17 G PO PACK: 17.0000 g | PACK | Freq: Every day | ORAL | Status: DC | PRN

## 2023-10-12 MED ORDER — INSULIN REGULAR(HUMAN) IN NACL 100-0.9 UT/100ML-% IV SOLN
INTRAVENOUS | Status: DC
  Administered 2023-10-12: 16 [IU]/h via INTRAVENOUS
  Filled 2023-10-12: qty 100

## 2023-10-12 MED ORDER — HYDROMORPHONE HCL 1 MG/ML IJ SOLN
1.0000 mg | Freq: Once | INTRAMUSCULAR | Status: AC
  Administered 2023-10-12: 1 mg via INTRAVENOUS
  Filled 2023-10-12: qty 1

## 2023-10-12 MED ORDER — POTASSIUM CHLORIDE 10 MEQ/100ML IV SOLN
10.0000 meq | INTRAVENOUS | Status: AC
  Administered 2023-10-12 (×2): 10 meq via INTRAVENOUS
  Filled 2023-10-12 (×2): qty 100

## 2023-10-12 MED ORDER — LACTATED RINGERS IV BOLUS
1000.0000 mL | Freq: Once | INTRAVENOUS | Status: AC
  Administered 2023-10-12: 1000 mL via INTRAVENOUS

## 2023-10-12 MED ORDER — HYDROMORPHONE HCL 1 MG/ML IJ SOLN
0.5000 mg | Freq: Once | INTRAMUSCULAR | Status: AC
  Administered 2023-10-12: 0.5 mg via INTRAVENOUS
  Filled 2023-10-12: qty 1

## 2023-10-12 MED ORDER — ENOXAPARIN SODIUM 40 MG/0.4ML IJ SOSY
40.0000 mg | PREFILLED_SYRINGE | INTRAMUSCULAR | Status: DC
  Administered 2023-10-12 – 2023-10-14 (×3): 40 mg via SUBCUTANEOUS
  Filled 2023-10-12 (×3): qty 0.4

## 2023-10-12 MED ORDER — ORAL CARE MOUTH RINSE: 15.0000 mL | OROMUCOSAL | Status: DC | PRN

## 2023-10-12 MED ORDER — ACETAMINOPHEN 325 MG PO TABS: 650.0000 mg | ORAL_TABLET | Freq: Four times a day (QID) | ORAL | Status: DC | PRN

## 2023-10-12 MED ORDER — SENNOSIDES-DOCUSATE SODIUM 8.6-50 MG PO TABS
1.0000 | ORAL_TABLET | Freq: Two times a day (BID) | ORAL | Status: DC
  Administered 2023-10-12 – 2023-10-14 (×4): 1 via ORAL
  Filled 2023-10-12 (×5): qty 1

## 2023-10-12 MED ORDER — NALOXONE HCL 0.4 MG/ML IJ SOLN: 0.4000 mg | INTRAMUSCULAR | Status: DC | PRN

## 2023-10-12 MED ORDER — DEXTROSE 50 % IV SOLN
0.0000 mL | INTRAVENOUS | Status: DC | PRN
  Administered 2023-10-14 (×2): 50 mL via INTRAVENOUS
  Filled 2023-10-12 (×2): qty 50

## 2023-10-12 MED ORDER — AMOXICILLIN-POT CLAVULANATE 875-125 MG PO TABS
1.0000 | ORAL_TABLET | Freq: Two times a day (BID) | ORAL | Status: DC
  Administered 2023-10-12 – 2023-10-15 (×6): 1 via ORAL
  Filled 2023-10-12 (×6): qty 1

## 2023-10-12 MED ORDER — CHLORHEXIDINE GLUCONATE CLOTH 2 % EX PADS
6.0000 | MEDICATED_PAD | Freq: Every day | CUTANEOUS | Status: DC
  Administered 2023-10-12 – 2023-10-14 (×3): 6 via TOPICAL

## 2023-10-12 MED ORDER — INSULIN ASPART 100 UNIT/ML IJ SOLN
0.0000 [IU] | Freq: Three times a day (TID) | INTRAMUSCULAR | Status: DC
  Administered 2023-10-13: 2 [IU] via SUBCUTANEOUS
  Administered 2023-10-13: 3 [IU] via SUBCUTANEOUS
  Administered 2023-10-13 – 2023-10-15 (×3): 2 [IU] via SUBCUTANEOUS

## 2023-10-12 MED ORDER — DEXTROSE IN LACTATED RINGERS 5 % IV SOLN: INTRAVENOUS | Status: DC

## 2023-10-12 MED ORDER — DULOXETINE HCL 30 MG PO CPEP
60.0000 mg | ORAL_CAPSULE | Freq: Every day | ORAL | Status: DC
Start: 2023-10-12 — End: 2023-10-15
  Administered 2023-10-12 – 2023-10-14 (×3): 60 mg via ORAL
  Filled 2023-10-12 (×3): qty 2

## 2023-10-12 MED ORDER — HYDROMORPHONE HCL 1 MG/ML IJ SOLN
0.5000 mg | INTRAMUSCULAR | Status: AC | PRN
  Administered 2023-10-12 – 2023-10-13 (×3): 0.5 mg via INTRAVENOUS
  Filled 2023-10-12 (×3): qty 1

## 2023-10-12 MED ORDER — INSULIN ASPART 100 UNIT/ML IJ SOLN: 0.0000 [IU] | Freq: Every day | INTRAMUSCULAR | Status: DC

## 2023-10-12 NOTE — ED Notes (Signed)
 Attempted to call report, initially answered, forwarded to another phone which rang and went to VM.

## 2023-10-12 NOTE — H&P (Addendum)
 History and Physical  [name] TDD:[id number] DOB: January 25, 1986 DOA: 10/12/2023  Referring physician: Accepted by Dr. Ashley Royalty, [facility], hospitalist service. PCP: Elie Confer, NP  Outpatient Specialists: Gyn oncology. Patient coming from: Home through [facility] ED.  Chief Complaint: DKA type I  HPI: [name] is a 38 y.o. female with medical history significant for type 1 diabetes with previous DKA, diabetic polyneuropathy, chronic pain syndrome, vulvar intraepithelial neoplasm grade 2 (2023-Dr. Pricilla Holm), who initially presented to [facility] ED, brought in by family after her husband found her on the floor unresponsive and confused.  Unclear duration of unresponsiveness.  In the ER, afebrile, tachycardic, and tachypneic, arousable with complaints of abdominal pain.  Per the patient, she was seen 2 days ago at outside facility for URI symptoms and was prescribed Augmentin for sinusitis which she has been taking for the past 2 days.  Admits to persistent nausea.  Denies constipation.  UDS positive for opiates and THC.  Workup revealed DKA type I.  Started on IV insulin and IV fluid.  [facility], hospitalist service, was asked to admit.  Accepted by Dr. Ashley Royalty and transferred to [facility] stepdown unit as inpatient status.  ED Course: Temperature 98.3.  BP 132/88, pulse 117, respiratory rate 22, O2 saturation 100% on room air.  Lab studies notable for serum sodium 130, chloride 89, serum bicarb 17, glucose 427, calcium 10.8, anion gap 24, lactic acid 1.7.  Beta hydroxybutyrate acid 6.56.  WBC 8.7, hemoglobin 11.9, platelet count 419.  Review of Systems: Review of systems as noted in the HPI. All other systems reviewed and are negative.   Past Medical History:  Diagnosis Date   Chronic constipation 11/08/2021   Diabetic neuropathy (HCC)    History of chicken pox    History of noncompliance with medical treatment 09/07/2022   Type 1 diabetes (HCC)     Vaginal odor 11/08/2021   Past Surgical History:  Procedure Laterality Date   CESAREAN SECTION     only one C/S per pt.   CO2 LASER APPLICATION N/A 02/22/2022   Procedure: CO2 LASER APPLICATION TO VULVA;  Surgeon: Carver Fila, MD;  Location: [facility];  Service: General;  Laterality: N/A;   DILATION AND CURETTAGE OF UTERUS     EXCISION OF SKIN TAG N/A 02/22/2022   Procedure: EXCISION OF ANAL SKIN TAGS;  Surgeon: Romie Levee, MD;  Location: [facility];  Service: General;  Laterality: N/A;    Social History:  reports that she has never smoked. She has never been exposed to tobacco smoke. She has never used smokeless tobacco. She reports that she does not drink alcohol and does not use drugs.   Allergies  Allergen Reactions   Mirapex [Pramipexole] Shortness Of Breath    Family History  Problem Relation Age of Onset   Hypertension Mother    Glaucoma Mother    Diabetes Maternal Grandmother    Neuropathy Neg Hx    Colon cancer Neg Hx    Stomach cancer Neg Hx    Esophageal cancer Neg Hx    Colon polyps Neg Hx    Rectal cancer Neg Hx       Prior to Admission medications   Medication Sig Start Date End Date Taking? Authorizing Provider  amoxicillin-clavulanate (AUGMENTIN) 875-125 MG tablet Take 1 tablet by mouth 2 (two) times daily. 10/10/23  Yes [provider]  ALEVE 220 MG tablet Take 220-440 mg by mouth 2 (two) times daily as  needed (for headaches or pain).    [provider]  Continuous Blood Gluc Transmit (DEXCOM G6 TRANSMITTER) MISC Inject 1 each into the skin every 14 (fourteen) days. 12/31/21   Dulce Sellar, NP  Continuous Glucose Sensor (DEXCOM G7 SENSOR) MISC Inject 1 Device into the skin See admin instructions. Place 1 new sensor into the skin every 10 days    [provider]  DULoxetine (CYMBALTA) 60 MG capsule Take 60 mg by mouth at bedtime. 06/28/22   [provider]  insulin aspart  (NOVOLOG) 100 UNIT/ML FlexPen Inject 0-9 Units into the skin 3 (three) times daily with meals. CBG < 70: Eat or drink something sweet and recheck, CBG 70 - 120: 0 units CBG 121 - 150: 1 unit CBG 151 - 200: 2 units CBG 201 - 250: 3 units CBG 251 - 300: 5 units CBG 301 - 350: 7 units CBG 351 - 400: 9 units CBG > 400: call MD. Patient taking differently: Inject 0-9 Units into the skin See admin instructions. Inject 0-9 units into the skin 3 times a day with meals, PER SLIDING SCALE: "CBG < 70: Eat or drink something sweet and recheck, CBG 70 - 120: 0 units CBG 121 - 150: 1 unit CBG 151 - 200: 2 units CBG 201 - 250: 3 units CBG 251 - 300: 5 units CBG 301 - 350: 7 units CBG 351 - 400: 9 units CBG > 400: call MD" 08/07/23 11/05/23  Elease Etienne, MD  insulin aspart (NOVOLOG) 100 UNIT/ML FlexPen Inject 4 Units into the skin 3 (three) times daily with meals. (take 4 units Novolog with each meal + Add correction as per sliding scale for elevated blood sugars) Patient not taking: Reported on 08/27/2023 08/07/23   Elease Etienne, MD  insulin glargine (LANTUS) 100 UNIT/ML Solostar Pen Inject 30 Units into the skin 2 (two) times daily. 08/07/23   Hongalgi, Maximino Greenland, MD  Insulin Pen Needle (B-D UF III MINI PEN NEEDLES) 31G X 5 MM MISC To use with Insulin pens, as directed 08/07/23   Elease Etienne, MD  linaclotide (LINZESS) 145 MCG CAPS capsule Take 145 mcg by mouth daily as needed (for constipation).    [provider]  ondansetron (ZOFRAN-ODT) 4 MG disintegrating tablet Take 1 tablet (4 mg total) by mouth every 8 (eight) hours as needed for nausea or vomiting. 08/28/23   Amin, Ankit C, MD  oxyCODONE (OXY IR/ROXICODONE) 5 MG immediate release tablet Take 5 mg by mouth 4 (four) times daily as needed (for pain).    [provider]  potassium chloride SA (KLOR-CON M) 20 MEQ tablet Take 2 tablets (40 mEq total) by mouth daily for 2 days. 08/28/23 10/12/23  Miguel Rota, MD    Physical Exam: BP 132/88    Pulse (!) 117   Temp 98.3 F (36.8 C) (Oral)   Resp 11   Ht 5\' 6"  (1.676 m)   Wt 70.9 kg   LMP 10/04/2023   SpO2 100%   BMI 25.23 kg/m   General: 38 y.o. year-old female well developed well nourished in no acute distress.  Alert and oriented x3. Cardiovascular: Regular rate and rhythm with no rubs or gallops.  No thyromegaly or JVD noted.  No lower extremity edema. 2/4 pulses in all 4 extremities. Respiratory: Clear to auscultation with no wheezes or rales. Good inspiratory effort. Abdomen: Soft tender diffusely.  Nondistended with normal bowel sounds x4 quadrants. Muskuloskeletal: No cyanosis, clubbing or edema noted bilaterally  Neuro: CN II-XII intact, strength, sensation, reflexes Skin: No ulcerative lesions noted or rashes Psychiatry: Judgement and insight appear normal. Mood is appropriate for condition and setting          Labs on Admission:  Basic Metabolic Panel: Recent Labs  Lab 10/09/23 1143 10/09/23 1159 10/12/23 1233 10/12/23 1240  NA 128* 127* 130* 131*  K 5.6* 4.8 5.1 4.9  CL  --  88* 89*  --   CO2  --  26 17*  --   GLUCOSE  --  479* 427*  --   BUN  --  13 11  --   CREATININE  --  0.89 0.89  --   CALCIUM  --  9.7 10.8*  --    Liver Function Tests: Recent Labs  Lab 10/09/23 1159 10/12/23 1233  AST 8* 9*  ALT 5 <5  ALKPHOS 106 98  BILITOT 0.5 0.5  PROT 8.7* 9.6*  ALBUMIN 4.2 4.7   No results for input(s): "LIPASE", "AMYLASE" in the last 168 hours. No results for input(s): "AMMONIA" in the last 168 hours. CBC: Recent Labs  Lab 10/09/23 1143 10/09/23 1200 10/12/23 1233 10/12/23 1240  WBC  --  9.9 7.8  --   NEUTROABS  --  6.9 5.3  --   HGB 12.9 13.3 14.0 15.0  HCT 38.0 38.8 41.3 44.0  MCV  --  89.4 89.4  --   PLT  --  393 498*  --    Cardiac Enzymes: No results for input(s): "CKTOTAL", "CKMB", "CKMBINDEX", "TROPONINI" in the last 168 hours.  BNP (last 3 results) Recent Labs    04/02/23 0205  BNP 11.2    ProBNP (last 3  results) No results for input(s): "PROBNP" in the last 8760 hours.  CBG: Recent Labs  Lab 10/12/23 1427 10/12/23 1554 10/12/23 1655 10/12/23 1813 10/12/23 1859  GLUCAP 399* 238* 151* 138* 157*    Radiological Exams on Admission: DG Chest Portable 1 View Result Date: 10/12/2023 CLINICAL DATA:  Syncopal episode today. EXAM: PORTABLE CHEST 1 VIEW COMPARISON:  05/01/2023 FINDINGS: The heart size and mediastinal contours are within normal limits. Both lungs are clear. The visualized skeletal structures are unremarkable. IMPRESSION: No active disease. Electronically Signed   By: Danae Orleans M.D.   On: 10/12/2023 13:38   CT Head Wo Contrast Result Date: 10/12/2023 CLINICAL DATA:  Provided history: Mental status change, unknown cause. Additional history provided: Patient found unconscious on floor by husband, history of type 1 diabetes. EXAM: CT HEAD WITHOUT CONTRAST TECHNIQUE: Contiguous axial images were obtained from the base of the skull through the vertex without intravenous contrast. RADIATION DOSE REDUCTION: This exam was performed according to the departmental dose-optimization program which includes automated exposure control, adjustment of the mA and/or kV according to patient size and/or use of iterative reconstruction technique. COMPARISON:  Non-contrast head CT 05/01/2023. FINDINGS: Brain: Cerebral volume is normal. There is no acute intracranial hemorrhage. No demarcated cortical infarct. No extra-axial fluid collection. No evidence of an intracranial mass. No midline shift. Vascular: No hyperdense vessel. Skull: No calvarial fracture or aggressive osseous lesion. Sinuses/Orbits: No mass or acute finding within the imaged orbits. Mild mucosal thickening within the left frontal, bilateral ethmoid, bilateral sphenoid and bilateral maxillary sinuses at the imaged levels. IMPRESSION: 1. No evidence of an acute intracranial abnormality. 2. Mild paranasal sinus mucosal thickening at the imaged  levels. Electronically Signed   By: Jackey Loge D.O.   On: 10/12/2023 13:23    EKG: I independently  viewed the EKG done and my findings are as followed: Sinus tachycardia rate of 119.  Nonspecific ST-T changes.  QTc 449.  Assessment/Plan Present on Admission:  DKA (diabetic ketoacidosis) (HCC)  Active Problems:   DKA (diabetic ketoacidosis) (HCC)  DKA type I unclear trigger Reportedly, there was compliance with home insulin regimen No sign of systemic infection, lactic acid is normal, afebrile with no leukocytosis. Last hemoglobin A1c 9.4 on 08/27/2023. DKA protocol in place Monitor anion gap for resolution of DKA BMP every 4 hours Beta hydroxybutyrate acid every 4 hours Transition to subcu insulin when indicated per DKA protocol  High anion gap metabolic acidosis secondary to DKA Treat DKA with IV insulin and IV fluid Recheck BMP Check magnesium and phosphorus levels  Acute metabolic encephalopathy, resolved Reportedly unresponsive and confused, by family member Suspect hypovolemia from severe dehydration as a result of DKA Continue IV fluid Obtain orthostatic vital signs every shift x 2  Intractable nausea in the setting of DKA Continue to treat underlying condition IV antiemetics as needed Continue IV fluid  Thrombocytosis suspect from dehydration Hydrate Repeat CBC in the morning  Chronic pain syndrome Resume home opiates, judiciously Start bowel regimen to avoid constipation  THC use UDS positive for opiate and THC on 10/11/2022  Chronic anxiety/depression Resume home regimen  Recent diagnosis of sinusitis on Augmentin Diagnosed at outside facility Resume home antibiotic   Critical care time: 65 minutes.   DVT prophylaxis: Subcu Lovenox daily.  Code Status: Full code.  Family Communication: Husband at bedside.  Disposition Plan: Admitted to stepdown unit.  Consults called: Diabetic coordinator.  Admission status: Inpatient status.   Status  is: Inpatient The patient requires at least 2 midnights for further evaluation and treatment of present condition.   Darlin Drop MD [facility] Pager [phone]  If 7PM-7AM, please contact night-coverage [email or web address] Password [facility]  10/12/2023, 7:26 PM

## 2023-10-12 NOTE — ED Triage Notes (Signed)
 Pt BIB family, husband found at home on floor unconscious, reports pt in this state for 10-20 mins since finding. Pt has hx Type 1 diabetes. CBG 425 upon arrival. Pt arousable once in room. Upon arousing pt c/o stomach pain   Last known normal last night per husband. Seen recently for hyperglycemia and flu-like sx

## 2023-10-12 NOTE — ED Notes (Signed)
 Lab advised this RN of need for redraw of BMP. Primary RN stated pt was difficult stick, requiring [place] IV. EDP notified of same & potential inability to redraw lab until additional access obtained, advised that pt has been accepted by [facility], "might have to wait."

## 2023-10-12 NOTE — Plan of Care (Signed)
 38 year old female with type 1 diabetes diabetic gastroparesis being transferred from [facility] accepted admission to stepdown unit for DKA.  Family found her unresponsive/confused on the floor unknown period of time.  Reports she is compliant to insulin.  They found her confused, no incontinence was noted.  They reported seizure-like activity.  Though it is unclear.  Urine tox screen pending.  She was found to be in DKA hypertensive tachycardic afebrile not hypoxic.  She was started on Endo tool in the ER.  Admitting diagnosis DKA.

## 2023-10-12 NOTE — ED Provider Notes (Signed)
 [place] [facility]ERGENC[facility] DEPARTMENT AT [facility] Provider Note   CSN: [id number] Arrival date & time: 10/12/23  1201     History  Chief Complaint  Patient presents with   Loss of Consciousness   Hyperglycemia    [name] is a 38 y.o. female.  This is a 38 year old female presenting emergency department for syncopal episode/unresponsive.  Family noted she has been like that roughly 10 to 20 minutes prior to arrival.  See ED course for full HPI.   Loss of Consciousness Hyperglycemia Associated symptoms: syncope        Home Medications Prior to Admission medications   Medication Sig Start Date End Date Taking? Authorizing Provider  ALEVE 220 MG tablet Take 220-440 mg by mouth 2 (two) times daily as needed (for headaches or pain).    [provider]  Continuous Blood Gluc Transmit (DEXCOM G6 TRANSMITTER) MISC Inject 1 each into the skin every 14 (fourteen) days. 12/31/21   Dulce Sellar, NP  Continuous Glucose Sensor (DEXCOM G7 SENSOR) MISC Inject 1 Device into the skin See admin instructions. Place 1 new sensor into the skin every 10 days    [provider]  DULoxetine (CYMBALTA) 60 MG capsule Take 60 mg by mouth at bedtime. 06/28/22   [provider]  insulin aspart (NOVOLOG) 100 UNIT/ML FlexPen Inject 0-9 Units into the skin 3 (three) times daily with meals. CBG < 70: Eat or drink something sweet and recheck, CBG 70 - 120: 0 units CBG 121 - 150: 1 unit CBG 151 - 200: 2 units CBG 201 - 250: 3 units CBG 251 - 300: 5 units CBG 301 - 350: 7 units CBG 351 - 400: 9 units CBG > 400: call MD. Patient taking differently: Inject 0-9 Units into the skin See admin instructions. Inject 0-9 units into the skin 3 times a day with meals, PER SLIDING SCALE: "CBG < 70: Eat or drink something sweet and recheck, CBG 70 - 120: 0 units CBG 121 - 150: 1 unit CBG 151 - 200: 2 units CBG 201 - 250: 3 units CBG 251 - 300: 5 units CBG 301 - 350: 7 units  CBG 351 - 400: 9 units CBG > 400: call MD" 08/07/23 11/05/23  Elease Etienne, MD  insulin aspart (NOVOLOG) 100 UNIT/ML FlexPen Inject 4 Units into the skin 3 (three) times daily with meals. (take 4 units Novolog with each meal + Add correction as per sliding scale for elevated blood sugars) Patient not taking: Reported on 08/27/2023 08/07/23   Elease Etienne, MD  insulin glargine (LANTUS) 100 UNIT/ML Solostar Pen Inject 30 Units into the skin 2 (two) times daily. 08/07/23   Hongalgi, Maximino Greenland, MD  Insulin Pen Needle (B-D UF III MINI PEN NEEDLES) 31G X 5 MM MISC To use with Insulin pens, as directed 08/07/23   Elease Etienne, MD  linaclotide (LINZESS) 145 MCG CAPS capsule Take 145 mcg by mouth daily as needed (for constipation).    [provider]  ondansetron (ZOFRAN-ODT) 4 MG disintegrating tablet Take 1 tablet (4 mg total) by mouth every 8 (eight) hours as needed for nausea or vomiting. 08/28/23   Amin, Ankit C, MD  oxyCODONE (OXY IR/ROXICODONE) 5 MG immediate release tablet Take 5 mg by mouth 4 (four) times daily as needed (for pain).    [provider]  potassium chloride SA (KLOR-CON M) 20 MEQ tablet Take 2 tablets (40 mEq total) by mouth daily for 2 days. 08/28/23  08/30/23  Miguel Rota, MD      Allergies    Mirapex [pramipexole]    Review of Systems   Review of Systems  Cardiovascular:  Positive for syncope.    Physical Exam Updated Vital Signs BP (!) 180/109   Pulse (!) 136   Resp 18   Wt 74 kg   LMP 10/04/2023   SpO2 100%   BMI 26.33 kg/m  Physical Exam Vitals and nursing note reviewed.  Constitutional:      General: She is not in acute distress.    Appearance: She is not toxic-appearing.  HENT:     Head: Normocephalic.     Nose: Nose normal.     Mouth/Throat:     Mouth: Mucous membranes are moist.  Eyes:     Pupils: Pupils are equal, round, and reactive to light.  Cardiovascular:     Rate and Rhythm: Normal rate and regular rhythm.  Pulmonary:      Effort: Pulmonary effort is normal.     Breath sounds: Normal breath sounds.  Abdominal:     General: Abdomen is flat. There is no distension.     Tenderness: There is abdominal tenderness (generalized). There is no guarding or rebound.  Musculoskeletal:     Cervical back: Normal range of motion.  Skin:    General: Skin is warm.     Capillary Refill: Capillary refill takes less than 2 seconds.  Neurological:     General: No focal deficit present.     Mental Status: She is alert.  Psychiatric:        Mood and Affect: Mood normal.        Behavior: Behavior normal.     ED Results / Procedures / Treatments   Labs (all labs ordered are listed, but only abnormal results are displayed) Labs Reviewed  CBC WITH DIFFERENTIAL/PLATELET - Abnormal; Notable for the following components:      Result Value   Platelets 498 (*)    All other components within normal limits  BASIC METABOLIC PANEL - Abnormal; Notable for the following components:   Sodium 130 (*)    Chloride 89 (*)    CO2 17 (*)    Glucose, Bld 427 (*)    Calcium 10.8 (*)    Anion gap 24 (*)    All other components within normal limits  HEPATIC FUNCTION PANEL - Abnormal; Notable for the following components:   Total Protein 9.6 (*)    AST 9 (*)    All other components within normal limits  OSMOLALITY - Abnormal; Notable for the following components:   Osmolality 314 (*)    All other components within normal limits  I-STAT VENOUS BLOOD GAS, ED - Abnormal; Notable for the following components:   pCO2, Ven 43.8 (*)    pO2, Ven 20 (*)    Sodium 131 (*)    All other components within normal limits  CBG MONITORING, ED - Abnormal; Notable for the following components:   Glucose-Capillary 399 (*)    All other components within normal limits  ETHANOL  LACTIC ACID, PLASMA  HCG, QUANTITATIVE, PREGNANCY  URINALYSIS, ROUTINE W REFLEX MICROSCOPIC  PREGNANCY, URINE  BASIC METABOLIC PANEL  BASIC METABOLIC PANEL  BASIC  METABOLIC PANEL  BETA-HYDROXYBUTYRIC ACID  BETA-HYDROXYBUTYRIC ACID  BETA-HYDROXYBUTYRIC ACID  BETA-HYDROXYBUTYRIC ACID  RAPID URINE DRUG SCREEN, HOSP PERFORMED    EKG EKG Interpretation Date/Time:  Sunday October 12 2023 12:07:03 EST Ventricular Rate:  119 PR Interval:  127 QRS Duration:  76 QT Interval:  319 QTC Calculation: 449 R Axis:   77  Text Interpretation: Sinus tachycardia LAE, consider biatrial enlargement Confirmed by Estanislado Pandy [phone]) on 10/12/2023 12:18:06 PM  Radiology DG Chest Portable 1 View Result Date: 10/12/2023 CLINICAL DATA:  Syncopal episode today. EXAM: PORTABLE CHEST 1 VIEW COMPARISON:  05/01/2023 FINDINGS: The heart size and mediastinal contours are within normal limits. Both lungs are clear. The visualized skeletal structures are unremarkable. IMPRESSION: No active disease. Electronically Signed   By: Danae Orleans M.D.   On: 10/12/2023 13:38   CT Head Wo Contrast Result Date: 10/12/2023 CLINICAL DATA:  Provided history: Mental status change, unknown cause. Additional history provided: Patient found unconscious on floor by husband, history of type 1 diabetes. EXAM: CT HEAD WITHOUT CONTRAST TECHNIQUE: Contiguous axial images were obtained from the base of the skull through the vertex without intravenous contrast. RADIATION DOSE REDUCTION: This exam was performed according to the departmental dose-optimization program which includes automated exposure control, adjustment of the mA and/or kV according to patient size and/or use of iterative reconstruction technique. COMPARISON:  Non-contrast head CT 05/01/2023. FINDINGS: Brain: Cerebral volume is normal. There is no acute intracranial hemorrhage. No demarcated cortical infarct. No extra-axial fluid collection. No evidence of an intracranial mass. No midline shift. Vascular: No hyperdense vessel. Skull: No calvarial fracture or aggressive osseous lesion. Sinuses/Orbits: No mass or acute finding within the imaged  orbits. Mild mucosal thickening within the left frontal, bilateral ethmoid, bilateral sphenoid and bilateral maxillary sinuses at the imaged levels. IMPRESSION: 1. No evidence of an acute intracranial abnormality. 2. Mild paranasal sinus mucosal thickening at the imaged levels. Electronically Signed   By: Jackey Loge D.O.   On: 10/12/2023 13:23    Procedures Procedures    Medications Ordered in ED Medications  insulin regular, human (MYXREDLIN) 100 units/ 100 mL infusion (16 Units/hr Intravenous New Bag/Given 10/12/23 1441)  lactated ringers infusion (has no administration in time range)  dextrose 5 % in lactated ringers infusion (has no administration in time range)  dextrose 50 % solution 0-50 mL (has no administration in time range)  potassium chloride 10 mEq in 100 mL IVPB (10 mEq Intravenous New Bag/Given 10/12/23 1432)  lactated ringers bolus 1,000 mL (0 mLs Intravenous Stopped 10/12/23 1401)  metoCLOPramide (REGLAN) injection 10 mg (10 mg Intravenous Given 10/12/23 1239)  HYDROmorphone (DILAUDID) injection 0.5 mg (0.5 mg Intravenous Given 10/12/23 1306)  lactated ringers bolus 1,480 mL (1,480 mLs Intravenous New Bag/Given 10/12/23 1430)    ED Course/ Medical Decision Making/ A&P Clinical Course as of 10/12/23 1510  Sun Oct 12, 2023  1217 Called to bedside as patient was unresponsive.  She was breathing spontaneously, minimally responsive to painful stimuli, but maintaining her airway.  Slight tachycardia, hypertensive and maintaining oxygen saturation on room air.  When I open her eyes she suddenly became awake and asked where she was in went unresponsive again.  She would avoid striking herself in the face with hand when I held her arm up above her head and let go.  She then began talking and complaining of generalized abdominal pain.  Family at bedside stated they came home to found her passed out.   Has had several hospitalizations recently for diabetic gastroparesis/DKA.  Past medical  history per chart review :"PMH of type I DM with diabetic gastroparesis, neuropathy on chronic opioids, DKA, chronic constipation, VZV, runs out of insulins prematurely due to financial constraints " [TY]  1254 WBC: 7.8 [TY]  1254 pH,  Ven: 7.356 Not c/w dka. [TY]  1442 Spoke with hospitalist,  admit patient.  For HHS/DKA as she does have a anion gap, low bicarb and hyperglycemia.  Her liver enzymes do not suggest acute cholecystitis or hepatobiliary disease.  She has no leukocytosis to suggest systemic infection.  CT head was negative for acute pathology.  She is moving all extremities coordinated fashion.  Chest x-ray without pneumonia.  Her lactate is not elevated this would suggest no systemic infection or seizure. [TY]    Clinical Course User Index [TY] Coral Spikes, DO                                 Medical Decision Making This is a 38 year old female presenting emergency department for syncope/unresponsiveness.  She is tachycardic, hypertensive.  Maintaining oxygen saturation on room air.  Pseudoseizure like activity on arrival with flickering of her eyes.  See ED course.  She is now alert and oriented x 3.  Complaint of generalized abdominal pain nausea.  Supposedly compliance with her insulin, however her blood sugar is 425 and she is documented with medication noncompliance secondary to financial concerns.  She has generalized abdominal pain, but no focal areas of peritonitis.  Will get broad workup workup.  CT head to exclude intracranial pathology, however low suspicion.  Suspect DKA/HHS versus gastroparesis.  See ED course for final MDM/disposition.  Amount and/or Complexity of Data Reviewed Independent Historian:     Details: Family notes that they found her at home lying on the floor External Data Reviewed:     Details: See ED course Labs: ordered. Decision-making details documented in ED Course. Radiology: ordered and independent interpretation performed.  Risk Prescription  drug management. Decision regarding hospitalization.         Final Clinical Impression(s) / ED Diagnoses Final diagnoses:  Hyperosmolar hyperglycemic state (HHS) [facility])    Rx / DC Orders ED Discharge Orders     None         Coral Spikes, DO 10/12/23 1510

## 2023-10-12 NOTE — ED Notes (Signed)
 Called [facility] for transport, pt bed assignment is ready

## 2023-10-13 DIAGNOSIS — E1011 Type 1 diabetes mellitus with ketoacidosis with coma: Secondary | ICD-10-CM

## 2023-10-13 LAB — BASIC METABOLIC PANEL
Anion gap: 13 (ref 5–15)
BUN: 9 mg/dL (ref 6–20)
CO2: 22 mmol/L (ref 22–32)
Calcium: 9 mg/dL (ref 8.9–10.3)
Chloride: 101 mmol/L (ref 98–111)
Creatinine, Ser: 0.58 mg/dL (ref 0.44–1.00)
GFR, Estimated: >60 mL/min (ref >60)
Glucose, Bld: 143 mg/dL — ABNORMAL HIGH (ref 70–99)
Potassium: 3.6 mmol/L (ref 3.5–5.1)
Sodium: 136 mmol/L (ref 135–145)

## 2023-10-13 LAB — CBC
HCT: 34.5 % — ABNORMAL LOW (ref 36.0–46.0)
Hemoglobin: 11.2 g/dL — ABNORMAL LOW (ref 12.0–15.0)
MCH: 30.6 pg (ref 26.0–34.0)
MCHC: 32.5 g/dL (ref 30.0–36.0)
MCV: 94.3 fL (ref 80.0–100.0)
Platelets: 339 10*3/uL (ref 150–400)
RBC: 3.66 MIL/uL — ABNORMAL LOW (ref 3.87–5.11)
RDW: 13.2 % (ref 11.5–15.5)
WBC: 7.6 10*3/uL (ref 4.0–10.5)
nRBC: 0 % (ref 0.0–0.2)

## 2023-10-13 LAB — GLUCOSE, CAPILLARY
Glucose-Capillary: 211 mg/dL — ABNORMAL HIGH (ref 70–99)
Glucose-Capillary: 157 mg/dL — ABNORMAL HIGH (ref 70–99)
Glucose-Capillary: 194 mg/dL — ABNORMAL HIGH (ref 70–99)
Glucose-Capillary: 144 mg/dL — ABNORMAL HIGH (ref 70–99)

## 2023-10-13 LAB — BETA-HYDROXYBUTYRIC ACID
Beta-Hydroxybutyric Acid: 3.72 mmol/L — ABNORMAL HIGH (ref 0.05–0.27)
Beta-Hydroxybutyric Acid: 4.25 mmol/L — ABNORMAL HIGH (ref 0.05–0.27)

## 2023-10-13 MED ORDER — INSULIN GLARGINE 100 UNIT/ML ~~LOC~~ SOLN
20.0000 [IU] | Freq: Once | SUBCUTANEOUS | Status: AC
  Administered 2023-10-13: 20 [IU] via SUBCUTANEOUS
  Filled 2023-10-13: qty 0.2

## 2023-10-13 MED ORDER — HYDROMORPHONE HCL 1 MG/ML IJ SOLN
0.5000 mg | INTRAMUSCULAR | Status: AC | PRN
  Administered 2023-10-13 – 2023-10-14 (×3): 0.5 mg via INTRAVENOUS
  Filled 2023-10-13 (×3): qty 1

## 2023-10-13 MED ORDER — INSULIN GLARGINE 100 UNIT/ML ~~LOC~~ SOLN
30.0000 [IU] | Freq: Two times a day (BID) | SUBCUTANEOUS | Status: DC
  Administered 2023-10-13: 30 [IU] via SUBCUTANEOUS
  Filled 2023-10-13 (×2): qty 0.3

## 2023-10-13 NOTE — Progress Notes (Signed)
 PROGRESS NOTE    [name]  ZOX:[id number] DOB: 1986/01/04 DOA: 10/12/2023 PCP: Elie Confer, NP  Chief Complaint  Patient presents with   Loss of Consciousness   Hyperglycemia    Brief Narrative:   [name] is [name] 38 y.o. 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  Unclear duration of unresponsiveness.   She was admitted for DKA.   Assessment & Plan:   Active Problems:   DKA (diabetic ketoacidosis) (HCC)  DKA type I unclear trigger Reportedly, there was compliance with home insulin regimen - she suspects it's related to recent sinusitis infection No sign of systemic infection Last hemoglobin A1c 9.4 on 08/27/2023. We've transitioned her to her basal insulin + SSI (she notes she takes 60 units levemir twice Kiandra Sanguinetti day, no episodes of hypoglycemia - just Halea Lieb note, this is Muath Hallam pretty high insulin dose for Kadar Chance type 1 diabetic)   High anion gap metabolic acidosis secondary to DKA Improved, now on subcutaneous insulin   Acute metabolic encephalopathy, resolved resolved   Intractable nausea in the setting of DKA Abdominal Discomfort Still some nausea and abdominal discomfort today today, will monitor   Thrombocytosis suspect from dehydration resolved   Chronic pain syndrome Resume home opiates, judiciously Start bowel regimen to avoid constipation   THC use UDS positive for opiate and THC on 10/11/2022   Chronic anxiety/depression Resume home regimen   Recent diagnosis of sinusitis on Augmentin Diagnosed at outside facility Resume home antibiotic     DVT prophylaxis: lovenox Code Status: full Family Communication: none Disposition:   Status is: Inpatient Remains  inpatient appropriate because: need for ongoing care   Consultants:  none  Procedures:  none  Antimicrobials:  Anti-infectives (From admission, onward)    Start     Dose/Rate Route Frequency Ordered Stop   10/12/23 2200  amoxicillin-clavulanate (AUGMENTIN) 875-125 MG per tablet 1 tablet        1 tablet Oral 2 times daily 10/12/23 2011         Subjective: C/o nausea and some mild abdominal discomfort this am  Objective: Vitals:   10/13/23 0800 10/13/23 0858 10/13/23 0900 10/13/23 0905  BP: (!) 160/104 (!) 170/106 (!) 170/106 (!) 131/90  Pulse: 97 (!) 114 (!) 109 (!) 110  Resp: 18   (!) 9  Temp:      TempSrc:      SpO2: 99% 99% 98% 100%  Weight:      Height:        Intake/Output Summary (Last 24 hours) at 10/13/2023 0932 Last data filed at 10/13/2023 0800 Gross per 24 hour  Intake 4169.05 ml  Output --  Net 4169.05 ml   Filed Weights   10/12/23 1340 10/12/23 1900  Weight: 74 kg 70.9 kg    Examination:  General exam: Appears calm and mildly uncomfortable  Respiratory system: unlabored Cardiovascular system: RRR Gastrointestinal system: Abdomen is nondistended, soft and mildly diffusely tender. Central nervous system: Alert and oriented. No focal neurological deficits. Extremities: no LEE    Data Reviewed: I have personally reviewed following labs and imaging studies  CBC: Recent Labs  Lab 10/09/23 1200 10/12/23 1233 10/12/23 1240 10/12/23 1930 10/13/23 0305  WBC 9.9 7.8  --  8.7 7.6  NEUTROABS 6.9 5.3  --   --   --  HGB 13.3 14.0 15.0 11.9* 11.2*  HCT 38.8 41.3 44.0 36.3 34.5*  MCV 89.4 89.4  --  90.5 94.3  PLT 393 498*  --  419* 339    Basic Metabolic Panel: Recent Labs  Lab 10/09/23 1159 10/12/23 1233 10/12/23 1240 10/12/23 1922 10/12/23 1930 10/12/23 2144 10/13/23 0305  NA 127* 130* 131* 136  --  137 136  K 4.8 5.1 4.9 3.7  --  3.6 3.6  CL 88* 89*  --  98  --  100 101  CO2 26 17*  --  24  --  28 22  GLUCOSE 479* 427*  --  168*   --  121* 143*  BUN 13 11  --  10  --  9 9  CREATININE 0.89 0.89  --  0.72  --  0.68 0.58  CALCIUM 9.7 10.8*  --  9.3  --  9.2 9.0  MG  --   --   --   --  1.8  --   --   PHOS  --   --   --   --  1.9*  --   --     GFR: Estimated Creatinine Clearance: 90.1 mL/min (by C-G formula based on SCr of 0.58 mg/dL).  Liver Function Tests: Recent Labs  Lab 10/09/23 1159 10/12/23 1233  AST 8* 9*  ALT 5 <5  ALKPHOS 106 98  BILITOT 0.5 0.5  PROT 8.7* 9.6*  ALBUMIN 4.2 4.7    CBG: Recent Labs  Lab 10/12/23 1813 10/12/23 1859 10/12/23 2011 10/12/23 2225 10/13/23 0830  GLUCAP 138* 157* 155* 120* 211*     Recent Results (from the past 240 hours)  Resp panel by RT-PCR (RSV, Flu Nichoel Digiulio&B, Covid) Anterior Nasal Swab     Status: None   Collection Time: 10/09/23 10:56 AM   Specimen: Anterior Nasal Swab  Result Value Ref Range Status   SARS Coronavirus 2 by RT PCR NEGATIVE NEGATIVE Final    Comment: (NOTE) SARS-CoV-2 target nucleic acids are NOT DETECTED.  The SARS-CoV-2 RNA is generally detectable in upper respiratory specimens during the acute phase of infection. The lowest concentration of SARS-CoV-2 viral copies this assay can detect is 138 copies/mL. Ellin Fitzgibbons negative result does not preclude SARS-Cov-2 infection and should not be used as the sole basis for treatment or other patient management decisions. Arran Fessel negative result may occur with  improper specimen collection/handling, submission of specimen other than nasopharyngeal swab, presence of viral mutation(s) within the areas targeted by this assay, and inadequate number of viral copies(<138 copies/mL). Shawntrice Salle negative result must be combined with clinical observations, patient history, and epidemiological information. The expected result is Negative.  Fact Sheet for Patients:  BloggerCourse.com  Fact Sheet for Healthcare Providers:  SeriousBroker.it  This test is no t yet approved or  cleared by the [place] FDA and  has been authorized for detection and/or diagnosis of SARS-CoV-2 by FDA under an Emergency Use Authorization (EUA). This EUA will remain  in effect (meaning this test can be used) for the duration of the COVID-19 declaration under Section 564(b)(1) of the Act, 21 U.S.C.section 360bbb-3(b)(1), unless the authorization is terminated  or revoked sooner.       Influenza Neoma Uhrich by PCR NEGATIVE NEGATIVE Final   Influenza B by PCR NEGATIVE NEGATIVE Final    Comment: (NOTE) The Xpert Xpress SARS-CoV-2/FLU/RSV plus assay is intended as an aid in the diagnosis of influenza from Nasopharyngeal swab specimens and should not be used as Sarahy Creedon sole  basis for treatment. Nasal washings and aspirates are unacceptable for Xpert Xpress SARS-CoV-2/FLU/RSV testing.  Fact Sheet for Patients: BloggerCourse.com  Fact Sheet for Healthcare Providers: SeriousBroker.it  This test is not yet approved or cleared by the [place] FDA and has been authorized for detection and/or diagnosis of SARS-CoV-2 by FDA under an Emergency Use Authorization (EUA). This EUA will remain in effect (meaning this test can be used) for the duration of the COVID-19 declaration under Section 564(b)(1) of the Act, 21 U.S.C. section 360bbb-3(b)(1), unless the authorization is terminated or revoked.     Resp Syncytial Virus by PCR NEGATIVE NEGATIVE Final    Comment: (NOTE) Fact Sheet for Patients: BloggerCourse.com  Fact Sheet for Healthcare Providers: SeriousBroker.it  This test is not yet approved or cleared by the [place] FDA and has been authorized for detection and/or diagnosis of SARS-CoV-2 by FDA under an Emergency Use Authorization (EUA). This EUA will remain in effect (meaning this test can be used) for the duration of the COVID-19 declaration under Section 564(b)(1) of the Act, 21  U.S.C. section 360bbb-3(b)(1), unless the authorization is terminated or revoked.  Performed at [facility], [address], [place], [place] [address]   MRSA Next Gen by PCR, Nasal     Status: Abnormal   Collection Time: 10/12/23  7:00 PM   Specimen: Nasal Mucosa; Nasal Swab  Result Value Ref Range Status   MRSA by PCR Next Gen DETECTED (Alanis Clift) NOT DETECTED Final    Comment: CRITICAL RESULT CALLED TO, READ BACK BY AND VERIFIED WITH: Daneil Dan, RN @ [facility] 10/12/23 (NOTE) The GeneXpert MRSA Assay (FDA approved for NASAL specimens only), is one component of Chrisandra Wiemers comprehensive MRSA colonization surveillance program. It is not intended to diagnose MRSA infection nor to guide or monitor treatment for MRSA infections. Test performance is not FDA approved in patients less than 59 years old. Performed at [facility], [address] [place], [place] [address]          Radiology Studies: DG Chest Portable 1 View Result Date: 10/12/2023 CLINICAL DATA:  Syncopal episode today. EXAM: PORTABLE CHEST 1 VIEW COMPARISON:  05/01/2023 FINDINGS: The heart size and mediastinal contours are within normal limits. Both lungs are clear. The visualized skeletal structures are unremarkable. IMPRESSION: No active disease. Electronically Signed   By: Danae Orleans M.D.   On: 10/12/2023 13:38   CT Head Wo Contrast Result Date: 10/12/2023 CLINICAL DATA:  Provided history: Mental status change, unknown cause. Additional history provided: Patient found unconscious on floor by husband, history of type 1 diabetes. EXAM: CT HEAD WITHOUT CONTRAST TECHNIQUE: Contiguous axial images were obtained from the base of the skull through the vertex without intravenous contrast. RADIATION DOSE REDUCTION: This exam was performed according to the departmental dose-optimization program which includes automated exposure control, adjustment of the mA and/or kV according to patient size and/or use  of iterative reconstruction technique. COMPARISON:  Non-contrast head CT 05/01/2023. FINDINGS: Brain: Cerebral volume is normal. There is no acute intracranial hemorrhage. No demarcated cortical infarct. No extra-axial fluid collection. No evidence of an intracranial mass. No midline shift. Vascular: No hyperdense vessel. Skull: No calvarial fracture or aggressive osseous lesion. Sinuses/Orbits: No mass or acute finding within the imaged orbits. Mild mucosal thickening within the left frontal, bilateral ethmoid, bilateral sphenoid and bilateral maxillary sinuses at the imaged levels. IMPRESSION: 1. No evidence of an acute intracranial abnormality. 2. Mild paranasal sinus mucosal thickening at the imaged levels. Electronically Signed  By: Jackey Loge D.O.   On: 10/12/2023 13:23        Scheduled Meds:  amoxicillin-clavulanate  1 tablet Oral BID   Chlorhexidine Gluconate Cloth  6 each Topical Daily   DULoxetine  60 mg Oral QHS   enoxaparin (LOVENOX) injection  40 mg Subcutaneous Q24H   insulin aspart  0-5 Units Subcutaneous QHS   insulin aspart  0-9 Units Subcutaneous TID WC   insulin glargine  20 Units Subcutaneous Once   insulin glargine  30 Units Subcutaneous BID   senna-docusate  1 tablet Oral BID   Continuous Infusions:  lactated ringers 125 mL/hr at 10/13/23 0800     LOS: 1 day    Time spent: over 30 min    Lacretia Nicks, MD [facility]   To contact the attending provider between 7A-7P or the covering provider during after hours 7P-7A, please log into the web site [email or web address] and access using universal [place] password for that web site. If you do not have the password, please call the hospital operator.  10/13/2023, 9:32 AM

## 2023-10-13 NOTE — Plan of Care (Signed)

## 2023-10-13 NOTE — Inpatient Diabetes Management (Addendum)
 Inpatient Diabetes Program Recommendations  AACE/ADA: New Consensus Statement on Inpatient Glycemic Control (2015)  Target Ranges:  Prepandial:   less than 140 mg/dL      Peak postprandial:   less than 180 mg/dL (1-2 hours)      Critically ill patients:  140 - 180 mg/dL    Latest Reference Range & Units 10/12/23 14:27 10/12/23 15:54 10/12/23 16:55 10/12/23 18:13 10/12/23 18:59 10/12/23 20:11 10/12/23 22:25 10/13/23 08:30  Glucose-Capillary 70 - 99 mg/dL 161 (H) 096 (H) 045 (H) 138 (H) 157 (H) 155 (H)  10 units Semglee @2138  120 (H)  IV Insulin Drip Stopped 211 (H)  3 units Novolog  10 units Semglee  (H): Data is abnormally high    Admit DKA  History: Type 1 diabetes  Home DM Meds: Lantus 30 units BID     Novolog 0-9 units TID     Dexcom G6 CGM  Current Orders: Semglee 30 units BID   Novolog Sensitive Correction Scale/ SSI (0-9 units) TID AC + HS      Current A1c= 9.4% (Jan 2025) Was admitted 08/26/2022 with Gastroparesis issues Was admitted 08/05/2023 with DKA (ran out of insulin--DM Coordinator spoke w/ pt and pt requested 90 day supply of Novolog b/c her insurance would cover 90 day supply at cheaper price  PCP:??  Note Semglee insulin increased this AM   Met w/ pt this AM at bedside.  Pt confirmed home meds: Levemir 60 units BID + Novolog per SSI.  Also uses the Dexcom G7 CGM for glucose monitoring.  Pt denies missing any doses.  I reminded pt that Levemir will no longer be manufactured and pt told me her PCP (Dr. Vida Rigger with [facility]) has a plan to switch her to Lantus with her next fill.  Has more Dexcom sensors at home and states she has all insulin at home.  Did not have any questions for me at this time.      --Will follow patient during hospitalization--  Ambrose Finland RN, MSN, CDCES Diabetes Coordinator Inpatient Glycemic Control Team Team Pager: [phone] (8a-5p)

## 2023-10-14 ENCOUNTER — Telehealth (HOSPITAL_COMMUNITY): Payer: Self-pay | Admitting: Pharmacy Technician

## 2023-10-14 ENCOUNTER — Encounter (HOSPITAL_COMMUNITY): Payer: Self-pay | Admitting: Internal Medicine

## 2023-10-14 ENCOUNTER — Other Ambulatory Visit (HOSPITAL_COMMUNITY): Payer: Self-pay

## 2023-10-14 ENCOUNTER — Inpatient Hospital Stay (HOSPITAL_COMMUNITY): Payer: BC Managed Care – PPO

## 2023-10-14 DIAGNOSIS — E0811 Diabetes mellitus due to underlying condition with ketoacidosis with coma: Secondary | ICD-10-CM

## 2023-10-14 LAB — CBC WITH DIFFERENTIAL/PLATELET
Abs Immature Granulocytes: 0.04 10*3/uL (ref 0.00–0.07)
Basophils Absolute: 0.1 10*3/uL (ref 0.0–0.1)
Basophils Relative: 1 %
Eosinophils Absolute: 0.3 10*3/uL (ref 0.0–0.5)
Eosinophils Relative: 4 %
HCT: 33.4 % — ABNORMAL LOW (ref 36.0–46.0)
Hemoglobin: 11.2 g/dL — ABNORMAL LOW (ref 12.0–15.0)
Immature Granulocytes: 1 %
Lymphocytes Relative: 42 %
Lymphs Abs: 2.9 10*3/uL (ref 0.7–4.0)
MCH: 30.4 pg (ref 26.0–34.0)
MCHC: 33.5 g/dL (ref 30.0–36.0)
MCV: 90.8 fL (ref 80.0–100.0)
Monocytes Absolute: 0.7 10*3/uL (ref 0.1–1.0)
Monocytes Relative: 9 %
Neutro Abs: 3 10*3/uL (ref 1.7–7.7)
Neutrophils Relative %: 43 %
Platelets: 347 10*3/uL (ref 150–400)
RBC: 3.68 MIL/uL — ABNORMAL LOW (ref 3.87–5.11)
RDW: 13.2 % (ref 11.5–15.5)
WBC: 6.9 10*3/uL (ref 4.0–10.5)
nRBC: 0 % (ref 0.0–0.2)

## 2023-10-14 LAB — COMPREHENSIVE METABOLIC PANEL
ALT: 7 U/L (ref 0–44)
AST: 10 U/L — ABNORMAL LOW (ref 15–41)
Albumin: 2.9 g/dL — ABNORMAL LOW (ref 3.5–5.0)
Alkaline Phosphatase: 56 U/L (ref 38–126)
Anion gap: 10 (ref 5–15)
BUN: 7 mg/dL (ref 6–20)
CO2: 27 mmol/L (ref 22–32)
Calcium: 9.2 mg/dL (ref 8.9–10.3)
Chloride: 101 mmol/L (ref 98–111)
Creatinine, Ser: 0.47 mg/dL (ref 0.44–1.00)
GFR, Estimated: >60 mL/min (ref >60)
Glucose, Bld: 60 mg/dL — ABNORMAL LOW (ref 70–99)
Potassium: 3.1 mmol/L — ABNORMAL LOW (ref 3.5–5.1)
Sodium: 138 mmol/L (ref 135–145)
Total Bilirubin: 0.8 mg/dL (ref 0.0–1.2)
Total Protein: 6.4 g/dL — ABNORMAL LOW (ref 6.5–8.1)

## 2023-10-14 LAB — GLUCOSE, CAPILLARY
Glucose-Capillary: 30 mg/dL — CL (ref 70–99)
Glucose-Capillary: 31 mg/dL — CL (ref 70–99)
Glucose-Capillary: 207 mg/dL — ABNORMAL HIGH (ref 70–99)
Glucose-Capillary: 37 mg/dL — CL (ref 70–99)
Glucose-Capillary: 179 mg/dL — ABNORMAL HIGH (ref 70–99)
Glucose-Capillary: 155 mg/dL — ABNORMAL HIGH (ref 70–99)
Glucose-Capillary: 163 mg/dL — ABNORMAL HIGH (ref 70–99)
Glucose-Capillary: 37 mg/dL — CL (ref 70–99)
Glucose-Capillary: 172 mg/dL — ABNORMAL HIGH (ref 70–99)
Glucose-Capillary: 27 mg/dL — CL (ref 70–99)
Glucose-Capillary: 36 mg/dL — CL (ref 70–99)
Glucose-Capillary: 230 mg/dL — ABNORMAL HIGH (ref 70–99)

## 2023-10-14 LAB — MAGNESIUM: Magnesium: 1.7 mg/dL (ref 1.7–2.4)

## 2023-10-14 LAB — PHOSPHORUS: Phosphorus: 3.9 mg/dL (ref 2.5–4.6)

## 2023-10-14 MED ORDER — HYDROMORPHONE HCL 1 MG/ML IJ SOLN
0.5000 mg | INTRAMUSCULAR | Status: AC | PRN
  Administered 2023-10-14 – 2023-10-15 (×3): 0.5 mg via INTRAVENOUS
  Filled 2023-10-14 (×3): qty 1

## 2023-10-14 MED ORDER — INSULIN GLARGINE 100 UNIT/ML ~~LOC~~ SOLN
15.0000 [IU] | Freq: Every day | SUBCUTANEOUS | Status: DC
  Administered 2023-10-14: 15 [IU] via SUBCUTANEOUS
  Filled 2023-10-14 (×2): qty 0.15

## 2023-10-14 MED ORDER — GLUCOSE 40 % PO GEL: 1.0000 | Freq: Once | ORAL | Status: DC

## 2023-10-14 MED ORDER — GLUCOSE 40 % PO GEL
ORAL | Status: AC
  Filled 2023-10-14: qty 2.42

## 2023-10-14 MED ORDER — METOCLOPRAMIDE HCL 5 MG/ML IJ SOLN
5.0000 mg | Freq: Four times a day (QID) | INTRAMUSCULAR | Status: DC
  Administered 2023-10-14 – 2023-10-15 (×4): 5 mg via INTRAVENOUS
  Filled 2023-10-14 (×4): qty 2

## 2023-10-14 MED ORDER — IOHEXOL 300 MG/ML  SOLN
30.0000 mL | Freq: Once | INTRAMUSCULAR | Status: AC | PRN
  Administered 2023-10-14: 30 mL via ORAL

## 2023-10-14 MED ORDER — POLYETHYLENE GLYCOL 3350 17 G PO PACK
17.0000 g | PACK | Freq: Two times a day (BID) | ORAL | Status: DC
  Administered 2023-10-14: 17 g via ORAL
  Filled 2023-10-14 (×2): qty 1

## 2023-10-14 MED ORDER — IOHEXOL 300 MG/ML  SOLN
100.0000 mL | Freq: Once | INTRAMUSCULAR | Status: AC | PRN
  Administered 2023-10-14: 100 mL via INTRAVENOUS

## 2023-10-14 MED ORDER — POTASSIUM CHLORIDE CRYS ER 20 MEQ PO TBCR
40.0000 meq | EXTENDED_RELEASE_TABLET | ORAL | Status: AC
  Administered 2023-10-14 (×2): 40 meq via ORAL
  Filled 2023-10-14 (×2): qty 2

## 2023-10-14 MED ORDER — INSULIN ASPART 100 UNIT/ML IJ SOLN
4.0000 [IU] | Freq: Three times a day (TID) | INTRAMUSCULAR | Status: DC
  Administered 2023-10-14: 4 [IU] via SUBCUTANEOUS

## 2023-10-14 MED ORDER — INSULIN GLARGINE 100 UNIT/ML ~~LOC~~ SOLN
20.0000 [IU] | Freq: Two times a day (BID) | SUBCUTANEOUS | Status: DC
  Filled 2023-10-14: qty 0.2

## 2023-10-14 NOTE — Progress Notes (Signed)
 PROGRESS NOTE    [name]  NWG:[id number] DOB: [birth year] DOA: 10/12/2023 PCP: Elie Confer, NP  Chief Complaint  Patient presents with   Loss of Consciousness   Hyperglycemia    Brief Narrative:   [name] is [name] 38 y.o. 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  Unclear duration of unresponsiveness.   She was admitted for DKA.   Assessment & Plan:   Active Problems:   DKA (diabetic ketoacidosis) (HCC)  DKA type I unclear trigger Anion Gap Metabolic Acidosis Reportedly, there was compliance with home insulin regimen - she suspects it's related to recent sinusitis infection No sign of systemic infection Last hemoglobin A1c 9.4 on 08/27/2023. We've transitioned her to her basal insulin + SSI (she notes she takes 60 units levemir twice Nahima Ales day, denies episodes of hypoglycemia at home (unfortunately, her phone app for dexcom didn't have any history I could review - > notable hypoglycemia this AM to 30's on only half her home dose.  Will reduce basal insulin to 15 units daily today, mealtime insulin ordered as well.  She may need Esai Stecklein dose reduction at discharge, follow BG's throughout the day.      Acute metabolic encephalopathy, resolved resolved   Intractable nausea in the setting of DKA Abdominal Discomfort Chronic nausea/abdominal pain -> in setting of longstanding type 1 diabetes, diabetic gastroparesis on ddx  She needs outpatient gastric emptying study Trial of reglan Avoid IV pain meds, on oxy prn - takes this chronically outpatient   Thrombocytosis suspect from dehydration resolved   Chronic pain syndrome Resume home opiates, judiciously Start bowel regimen to avoid  constipation   THC use UDS positive for opiate and THC on 10/11/2022   Chronic anxiety/depression Resume home regimen   Recent diagnosis of sinusitis on Augmentin Diagnosed at outside facility Resume home antibiotic (day 2 - plan for 7 day course)     DVT prophylaxis: lovenox Code Status: full Family Communication: none Disposition:   Status is: Inpatient Remains inpatient appropriate because: need for ongoing care   Consultants:  none  Procedures:  none  Antimicrobials:  Anti-infectives (From admission, onward)    Start     Dose/Rate Route Frequency Ordered Stop   10/12/23 2200  amoxicillin-clavulanate (AUGMENTIN) 875-125 MG per tablet 1 tablet        1 tablet Oral 2 times daily 10/12/23 2011         Subjective: Some nausea this AM  Objective: Vitals:   10/14/23 0600 10/14/23 0642 10/14/23 0700 10/14/23 0800  BP:  123/81 109/63 110/74  Pulse: 97 94 98 91  Resp: 10 14 (!) 8 (!) 9  Temp:    (!) 96.7 F (35.9 C)  TempSrc:    Axillary  SpO2: 96% 96% 98% 98%  Weight:      Height:        Intake/Output Summary (Last 24 hours) at 10/14/2023 0910 Last data filed at 10/14/2023 0006 Gross per 24 hour  Intake 499.82 ml  Output 500 ml  Net -0.18 ml   Filed Weights   10/12/23 1340 10/12/23 1900  Weight: 74 kg 70.9 kg    Examination:  General: No acute distress. Cardiovascular: mild tachy to 100's, regular Lungs: unlabored Abdomen: Soft, nontender, nondistended Neurological: Alert  and oriented 3. Moves all extremities 4 . Cranial nerves II through XII grossly intact. Extremities: No clubbing or cyanosis. No edema.   Data Reviewed: I have personally reviewed following labs and imaging studies  CBC: Recent Labs  Lab 10/09/23 1200 10/12/23 1233 10/12/23 1240 10/12/23 1930 10/13/23 0305 10/14/23 0255  WBC 9.9 7.8  --  8.7 7.6 6.9  NEUTROABS 6.9 5.3  --   --   --  3.0  HGB 13.3 14.0 15.0 11.9* 11.2* 11.2*  HCT 38.8 41.3 44.0 36.3 34.5* 33.4*   MCV 89.4 89.4  --  90.5 94.3 90.8  PLT 393 498*  --  419* 339 347    Basic Metabolic Panel: Recent Labs  Lab 10/12/23 1233 10/12/23 1240 10/12/23 1922 10/12/23 1930 10/12/23 2144 10/13/23 0305 10/14/23 0255  NA 130* 131* 136  --  137 136 138  K 5.1 4.9 3.7  --  3.6 3.6 3.1*  CL 89*  --  98  --  100 101 101  CO2 17*  --  24  --  28 22 27   GLUCOSE 427*  --  168*  --  121* 143* 60*  BUN 11  --  10  --  9 9 7   CREATININE 0.89  --  0.72  --  0.68 0.58 0.47  CALCIUM 10.8*  --  9.3  --  9.2 9.0 9.2  MG  --   --   --  1.8  --   --  1.7  PHOS  --   --   --  1.9*  --   --  3.9    GFR: Estimated Creatinine Clearance: 90.1 mL/min (by C-G formula based on SCr of 0.47 mg/dL).  Liver Function Tests: Recent Labs  Lab 10/09/23 1159 10/12/23 1233 10/14/23 0255  AST 8* 9* 10*  ALT 5 <5 7  ALKPHOS 106 98 56  BILITOT 0.5 0.5 0.8  PROT 8.7* 9.6* 6.4*  ALBUMIN 4.2 4.7 2.9*    CBG: Recent Labs  Lab 10/13/23 2138 10/14/23 0805 10/14/23 0810 10/14/23 0832 10/14/23 0903  GLUCAP 144* 30* 31* 37* 207*     Recent Results (from the past 240 hours)  Resp panel by RT-PCR (RSV, Flu Amorina Doerr&B, Covid) Anterior Nasal Swab     Status: None   Collection Time: 10/09/23 10:56 AM   Specimen: Anterior Nasal Swab  Result Value Ref Range Status   SARS Coronavirus 2 by RT PCR NEGATIVE NEGATIVE Final    Comment: (NOTE) SARS-CoV-2 target nucleic acids are NOT DETECTED.  The SARS-CoV-2 RNA is generally detectable in upper respiratory specimens during the acute phase of infection. The lowest concentration of SARS-CoV-2 viral copies this assay can detect is 138 copies/mL. Kennidee Heyne negative result does not preclude SARS-Cov-2 infection and should not be used as the sole basis for treatment or other patient management decisions. Marrell Dicaprio negative result may occur with  improper specimen collection/handling, submission of specimen other than nasopharyngeal swab, presence of viral mutation(s) within the areas  targeted by this assay, and inadequate number of viral copies(<138 copies/mL). Castiel Lauricella negative result must be combined with clinical observations, patient history, and epidemiological information. The expected result is Negative.  Fact Sheet for Patients:  BloggerCourse.com  Fact Sheet for Healthcare Providers:  SeriousBroker.it  This test is no t yet approved or cleared by the [place] FDA and  has been authorized for detection and/or diagnosis of SARS-CoV-2 by FDA under an Emergency Use Authorization (EUA). This EUA will remain  in effect (  meaning this test can be used) for the duration of the COVID-19 declaration under Section 564(b)(1) of the Act, 21 U.S.C.section 360bbb-3(b)(1), unless the authorization is terminated  or revoked sooner.       Influenza Lilyanne Mcquown by PCR NEGATIVE NEGATIVE Final   Influenza B by PCR NEGATIVE NEGATIVE Final    Comment: (NOTE) The Xpert Xpress SARS-CoV-2/FLU/RSV plus assay is intended as an aid in the diagnosis of influenza from Nasopharyngeal swab specimens and should not be used as Bralynn Donado sole basis for treatment. Nasal washings and aspirates are unacceptable for Xpert Xpress SARS-CoV-2/FLU/RSV testing.  Fact Sheet for Patients: BloggerCourse.com  Fact Sheet for Healthcare Providers: SeriousBroker.it  This test is not yet approved or cleared by the [place] FDA and has been authorized for detection and/or diagnosis of SARS-CoV-2 by FDA under an Emergency Use Authorization (EUA). This EUA will remain in effect (meaning this test can be used) for the duration of the COVID-19 declaration under Section 564(b)(1) of the Act, 21 U.S.C. section 360bbb-3(b)(1), unless the authorization is terminated or revoked.     Resp Syncytial Virus by PCR NEGATIVE NEGATIVE Final    Comment: (NOTE) Fact Sheet for  Patients: BloggerCourse.com  Fact Sheet for Healthcare Providers: SeriousBroker.it  This test is not yet approved or cleared by the [place] FDA and has been authorized for detection and/or diagnosis of SARS-CoV-2 by FDA under an Emergency Use Authorization (EUA). This EUA will remain in effect (meaning this test can be used) for the duration of the COVID-19 declaration under Section 564(b)(1) of the Act, 21 U.S.C. section 360bbb-3(b)(1), unless the authorization is terminated or revoked.  Performed at [facility], [address] [place], [place] [address]   MRSA Next Gen by PCR, Nasal     Status: Abnormal   Collection Time: 10/12/23  7:00 PM   Specimen: Nasal Mucosa; Nasal Swab  Result Value Ref Range Status   MRSA by PCR Next Gen DETECTED (France Lusty) NOT DETECTED Final    Comment: CRITICAL RESULT CALLED TO, READ BACK BY AND VERIFIED WITH: Daneil Dan, RN @ 2025 [facility] 10/12/23 (NOTE) The GeneXpert MRSA Assay (FDA approved for NASAL specimens only), is one component of Norman Piacentini comprehensive MRSA colonization surveillance program. It is not intended to diagnose MRSA infection nor to guide or monitor treatment for MRSA infections. Test performance is not FDA approved in patients less than 45 years old. Performed at [facility], [address] [place], [place] [address]          Radiology Studies: CT ABDOMEN PELVIS W CONTRAST Result Date: 10/14/2023 CLINICAL DATA:  Abdominal pain, acute, nonlocalized.  Nausea. EXAM: CT ABDOMEN AND PELVIS WITH CONTRAST TECHNIQUE: Multidetector CT imaging of the abdomen and pelvis was performed using the standard protocol following bolus administration of intravenous contrast. RADIATION DOSE REDUCTION: This exam was performed according to the departmental dose-optimization program which includes automated exposure control, adjustment of the mA and/or kV according to  patient size and/or use of iterative reconstruction technique. CONTRAST:  OMNIPAQUE IOHEXOL 300 MG/ML  SOLN COMPARISON:  04/01/2023. FINDINGS: Lower chest: Minimal atelectasis or infiltrate is present at the lung bases. There are trace bilateral pleural effusions. Hepatobiliary: No focal liver abnormality is seen. Stones are present within the gallbladder. No biliary ductal dilatation is seen. Pancreas: Unremarkable. No pancreatic ductal dilatation or surrounding inflammatory changes. Spleen: Normal in size without focal abnormality. Adrenals/Urinary Tract: The adrenal glands are within normal limits. The kidneys enhance symmetrically. No renal calculus or obstructive  uropathy bilaterally. The bladder is unremarkable. Stomach/Bowel: Stomach is within normal limits. Appendix appears normal. No evidence of bowel wall thickening, distention, or inflammatory changes. No free air or pneumatosis. Vascular/Lymphatic: Aortic atherosclerosis. No enlarged abdominal or pelvic lymph nodes. Reproductive: Uterus and bilateral adnexa are unremarkable. Other: No abdominopelvic ascites. Erlin Gardella small fat containing umbilical hernia is present. Skin thickening and subcutaneous fat stranding are noted in the mid right abdomen, unchanged from 2024 and likely chronic. Musculoskeletal: No acute osseous abnormality. IMPRESSION: 1. No  acute intra-abdominal process. 2. Trace bilateral pleural effusions with atelectasis or infiltrate at the lung bases. 3. Cholelithiasis. 4. Aortic atherosclerosis. Electronically Signed   By: Thornell Sartorius M.D.   On: 10/14/2023 05:21   DG Chest Portable 1 View Result Date: 10/12/2023 CLINICAL DATA:  Syncopal episode today. EXAM: PORTABLE CHEST 1 VIEW COMPARISON:  05/01/2023 FINDINGS: The heart size and mediastinal contours are within normal limits. Both lungs are clear. The visualized skeletal structures are unremarkable. IMPRESSION: No active disease. Electronically Signed   By: Danae Orleans M.D.   On:  10/12/2023 13:38   CT Head Wo Contrast Result Date: 10/12/2023 CLINICAL DATA:  Provided history: Mental status change, unknown cause. Additional history provided: Patient found unconscious on floor by husband, history of type 1 diabetes. EXAM: CT HEAD WITHOUT CONTRAST TECHNIQUE: Contiguous axial images were obtained from the base of the skull through the vertex without intravenous contrast. RADIATION DOSE REDUCTION: This exam was performed according to the departmental dose-optimization program which includes automated exposure control, adjustment of the mA and/or kV according to patient size and/or use of iterative reconstruction technique. COMPARISON:  Non-contrast head CT 05/01/2023. FINDINGS: Brain: Cerebral volume is normal. There is no acute intracranial hemorrhage. No demarcated cortical infarct. No extra-axial fluid collection. No evidence of an intracranial mass. No midline shift. Vascular: No hyperdense vessel. Skull: No calvarial fracture or aggressive osseous lesion. Sinuses/Orbits: No mass or acute finding within the imaged orbits. Mild mucosal thickening within the left frontal, bilateral ethmoid, bilateral sphenoid and bilateral maxillary sinuses at the imaged levels. IMPRESSION: 1. No evidence of an acute intracranial abnormality. 2. Mild paranasal sinus mucosal thickening at the imaged levels. Electronically Signed   By: Jackey Loge D.O.   On: 10/12/2023 13:23        Scheduled Meds:  amoxicillin-clavulanate  1 tablet Oral BID   Chlorhexidine Gluconate Cloth  6 each Topical Daily   dextrose  1 Tube Oral Once   dextrose  1 Tube Oral Once   DULoxetine  60 mg Oral QHS   enoxaparin (LOVENOX) injection  40 mg Subcutaneous Q24H   insulin aspart  0-5 Units Subcutaneous QHS   insulin aspart  0-9 Units Subcutaneous TID WC   insulin aspart  4 Units Subcutaneous TID WC   insulin glargine  15 Units Subcutaneous Daily   metoCLOPramide (REGLAN) injection  5 mg Intravenous Q6H   potassium  chloride  40 mEq Oral Q4H   senna-docusate  1 tablet Oral BID   Continuous Infusions:     LOS: 2 days    Time spent: over 30 min    Lacretia Nicks, MD [facility]   To contact the attending provider between 7A-7P or the covering provider during after hours 7P-7A, please log into the web site [email or web address] and access using universal [place] password for that web site. If you do not have the password, please call the hospital operator.  10/14/2023, 9:10 AM

## 2023-10-14 NOTE — Inpatient Diabetes Management (Signed)
 Inpatient Diabetes Program Recommendations  AACE/ADA: New Consensus Statement on Inpatient Glycemic Control (2015)  Target Ranges:  Prepandial:   less than 140 mg/dL      Peak postprandial:   less than 180 mg/dL (1-2 hours)      Critically ill patients:  140 - 180 mg/dL    Latest Reference Range & Units 10/14/23 08:05 10/14/23 08:10 10/14/23 08:32 10/14/23 09:03 10/14/23 10:28  Glucose-Capillary 70 - 99 mg/dL 30 (LL) 31 (LL) 37 (LL) 207 (H) 179 (H)  (LL): Data is critically low (H): Data is abnormally high    Admit DKA   History: Type 1 diabetes   Home DM Meds: Lantus 30 units BID                             Novolog 0-9 units TID                             Dexcom G6 CGM   Current Orders: Semglee 15 units daily                           Novolog Sensitive Correction Scale/ SSI (0-9 units) TID AC + HS   Novolog 4 units TID with meals    Note severe Hypoglycemia this AM Received total of 60 units Semglee insulin yest (home dose) Semglee dose reduced to 15 units Daily this AM  MD- Agree with reduction of Semglee dose Pt likely needs way less basal insulin at home  Since she has been taking Levemir insulin and Levemir insulin is no longer being manufactured, please switch pt to Lantus insulin at time of d/c.    Met w/ pt at bedside again this AM.  Reviewed severe HYPO event with her this AM.  We discussed the possibility that pt likely needs way less basal insulin at home--pt stated the MD already addressed this with her earlier.  I asked pt if we could go ahead and give her Rx for new basal insulin when she goes home since Levemir no longer being manufactured and pt stated she would be OK with this.  She can use up her remaining Levemir at home and then start Lantus when she runs out of Levemir (these 2 insulins are equivalent so same dose can apply).    --Will follow patient during hospitalization--  Ambrose Finland RN, MSN, CDCES Diabetes Coordinator Inpatient  Glycemic Control Team Team Pager: [phone] (8a-5p)

## 2023-10-14 NOTE — Telephone Encounter (Signed)
 Patient Product/process development scientist completed.    The patient is insured through [facility]. Patient has [employer], may use a copay card, and/or apply for patient assistance if available.    Ran test claim for Lantus Pen and the current 30 day co-pay is $5.00.   This test claim was processed through [facility]- copay amounts may vary at other pharmacies due to pharmacy/plan contracts, or as the patient moves through the different stages of their insurance plan.     Roland Earl, CPHT Pharmacy Technician III Certified Patient Advocate [facility] Pharmacy Patient Advocate Team Direct Number: [phone]  Fax: [phone]

## 2023-10-14 NOTE — TOC Initial Note (Signed)
 Transition of Care [facility]) - Initial/Assessment Note    Patient Details  Name: [name] MRN: [id number] Date of Birth: [birth year]  Transition of Care [facility]) CM/SW Contact:    Lanier Clam, RN Phone Number: 10/14/2023, 1:54 PM  Clinical Narrative:  d/c plan home.                 Expected Discharge Plan: Home/Self Care Barriers to Discharge: Continued Medical Work up   Patient Goals and CMS Choice   CMS Medicare.gov Compare Post Acute Care list provided to:: Patient Choice offered to / list presented to : Patient [place] ownership interest in [facility].provided to:: Patient    Expected Discharge Plan and Services                                              Prior Living Arrangements/Services                       Activities of Daily Living   ADL Screening (condition at time of admission) Independently performs ADLs?: Yes (appropriate for developmental age) Is the patient deaf or have difficulty hearing?: No Does the patient have difficulty seeing, even when wearing glasses/contacts?: No Does the patient have difficulty concentrating, remembering, or making decisions?: No  Permission Sought/Granted                  Emotional Assessment              Admission diagnosis:  DKA (diabetic ketoacidosis) (HCC) [E11.10] Hyperosmolar hyperglycemic state (HHS) (HCC) [E11.00] Patient Active Problem List   Diagnosis Date Noted   Anxiety 05/06/2023   DKA (diabetic ketoacidosis) (HCC) 05/06/2023   Cholelithiasis 04/04/2023   Hepatic steatosis 04/04/2023   Transaminitis 04/04/2023   Type 1 diabetes mellitus with hyperosmolar hyperglycemic state (HHS) (HCC) 03/31/2023   Intractable cyclical vomiting with nausea 03/31/2023   Cellulitis 02/02/2023   Severe hyperglycemia due to diabetes mellitus (HCC) 02/02/2023   Prolonged QT interval 02/02/2023   Intractable nausea and vomiting 09/07/2022   Gastroparesis 09/07/2022    Uncontrolled type 1 diabetes mellitus with hyperglycemia, with long-term current use of insulin (HCC) 09/07/2022   Diabetes mellitus due to underlying condition with diabetic neuropathy (HCC) 09/07/2022   History of noncompliance with medical treatment 09/07/2022   Nausea & vomiting 05/08/2022   Misuse of medication 05/08/2022   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   AKI (acute kidney injury) (HCC) 03/09/2022   UTI (urinary tract infection) 03/09/2022   Chronic pain syndrome 03/09/2022   Vulvar dysplasia    Type 1 diabetes mellitus with hyperlipidemia (HCC) 12/02/2021   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   Gastroesophageal reflux disease with esophagitis without hemorrhage 11/28/2021   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 09/10/2021   Drug induced constipation 08/10/2021   Vaginal yeast infection 08/10/2021   Abdominal pain 02/15/2021   Nausea and vomiting 02/15/2021   Diabetic polyneuropathy associated with type 1 diabetes mellitus (HCC) 03/14/2019   Non compliance with medical treatment 03/14/2019   Uncontrolled type 1 diabetes mellitus with hyperglycemia (HCC) 07/03/2011    Class: Acute   PCP:  Elie Confer, NP [phone] [place] [facility], [place] - [address] [place] [address] Phone: [phone] Fax: [phone]     Social  Drivers of Health (SDOH) Social History: SDOH Screenings   Food Insecurity: No Food Insecurity (10/12/2023)  Housing: Low Risk  (10/12/2023)  Transportation Needs: No Transportation Needs (10/12/2023)  Utilities: Not At Risk (10/12/2023)  Depression (PHQ2-9): Low Risk  (11/08/2021)  Social Connections: Moderately Integrated (10/12/2023)  Tobacco Use: Low Risk  (10/12/2023)   SDOH Interventions:     Readmission Risk Interventions    08/07/2023   11:55 AM 05/06/2023   12:55 PM 02/04/2023   11:55 AM  Readmission Risk Prevention Plan  Transportation Screening  Complete Complete Complete  Medication Review Oceanographer) Complete Complete Complete  PCP or Specialist appointment within 3-5 days of discharge Complete  Complete  HRI or Home Care Consult  Complete Complete  SW Recovery Care/Counseling Consult  Complete   Palliative Care Screening  Not Applicable   Skilled Nursing Facility  Not Applicable

## 2023-10-14 NOTE — Progress Notes (Signed)
 Hypoglycemic Event  CBG: 30  Treatment: 12 oz juice   Symptoms: None  Follow-up CBG: [id number] CBG Result:37  Possible Reasons for Event: Medication regimen: Modified Long acting insulin  Comments/MD notified: Lacretia Nicks MD  Next treatment: 25g of 50% Dextrose, Gltose oral gel offered but patient to nauseous to take more orals.   Follow-up CBG: Time: 0900     CBG Result: 207  MD notified: Lacretia Nicks MD at bedside   Teresita Madura

## 2023-10-14 NOTE — Plan of Care (Signed)
  Problem: Clinical Measurements: Goal: Cardiovascular complication will be avoided Outcome: Progressing   Problem: Activity: Goal: Risk for activity intolerance will decrease Outcome: Progressing   Problem: Coping: Goal: Level of anxiety will decrease Outcome: Progressing   Problem: Elimination: Goal: Will not experience complications related to urinary retention Outcome: Progressing   Problem: Pain Managment: Goal: General experience of comfort will improve and/or be controlled Outcome: Not Progressing   Problem: Safety: Goal: Ability to remain free from injury will improve Outcome: Progressing

## 2023-10-15 DIAGNOSIS — E1011 Type 1 diabetes mellitus with ketoacidosis with coma: Secondary | ICD-10-CM | POA: Diagnosis not present

## 2023-10-15 LAB — GLUCOSE, CAPILLARY
Glucose-Capillary: 165 mg/dL — ABNORMAL HIGH (ref 70–99)
Glucose-Capillary: 178 mg/dL — ABNORMAL HIGH (ref 70–99)
Glucose-Capillary: 271 mg/dL — ABNORMAL HIGH (ref 70–99)

## 2023-10-15 MED ORDER — INSULIN GLARGINE 100 UNIT/ML SOLOSTAR PEN: 12.0000 [IU] | PEN_INJECTOR | Freq: Every day | SUBCUTANEOUS | 0 refills | Status: DC

## 2023-10-15 MED ORDER — PEN NEEDLES 31G X 5 MM MISC: 1.0000 | Freq: Three times a day (TID) | 0 refills | Status: DC

## 2023-10-15 MED ORDER — INSULIN GLARGINE 100 UNIT/ML ~~LOC~~ SOLN
12.0000 [IU] | Freq: Every day | SUBCUTANEOUS | Status: DC
  Administered 2023-10-15: 12 [IU] via SUBCUTANEOUS
  Filled 2023-10-15: qty 0.12

## 2023-10-15 NOTE — Progress Notes (Signed)
Patient given discharge instructions, and verbalized an understanding of all discharge instructions.  Patient agrees with discharge plan, and is being discharged in stable medical condition.  Patient given transportation via wheelchair. 

## 2023-10-15 NOTE — Discharge Summary (Signed)
 Physician Discharge Summary  [name] WGN:[id number] DOB: 08/28/1985 DOA: 10/12/2023  PCP: Elie Confer, NP  Admit date: 10/12/2023 Discharge date: 10/15/2023  Admitted From: Home Disposition:  Home  Recommendations for Outpatient Follow-up:  Follow up with PCP in 1-2 weeks Follow-up with endocrinology outpatient Transition Levemir to Lantus 12 units subcutaneously daily Continue NovoLog sliding scale for meal coverage Patient encouraged to continue to monitor her glucose closely and to bring to next PCP/endocrinology visit for further adjustment needs in regards to her insulin regimen  Home Health: No Equipment/Devices: None  Discharge Condition: Stable CODE STATUS: Full code Diet recommendation: Consistent carbohydrate diet  History of present illness:  [name] is a 38 year old female with past medical history significant for type 1 diabetes, diabetic polyneuropathy, chronic pain syndrome, vulvar intraepithelial neoplasm grade 2 (2023, follows with Gyn/Onc Dr. Pricilla Holm), who initially presented to [facility] after family found her unresponsive at home on the floor.  Unclear duration.  Workup in the ED consistent with diabetic ketoacidosis.  Patient was started on insulin drip and transferred to [facility] under the hospitalist service.  Hospital course:  Diabetic ketoacidosis Metabolic acidosis Type 1 diabetes mellitus Patient presenting to ED after being found unresponsive by family at home.  Workup consistent with diabetic ketoacidosis.  Hemoglobin A1c 9.4 on 08/27/2023, poorly controlled.  Suspect noncompliance with home insulin regimen, although recently diagnosed with sinusitis and started on oral antibiotics.  No signs of systemic infection during evaluation.  Patient was initially placed on insulin drip and was titrated off back to subcutaneous insulin.  Patient did have episodes of hypoglycemia after transition and  patient's regimen was adjusted accordingly.  Patient will discharge on Lantus 12 units [place] daily and continue her NovoLog sliding scale insulin with meals.  Outpatient follow-up with PCP/endocrinology.  Acute metabolic encephalopathy Resolved now after treatment of DKA.  Intractable nausea/vomiting Etiology likely secondary to diabetic ketoacidosis.  Also probably underlying diabetic gastroparesis.  Patient diet was advanced with toleration following treatment of DKA as above.  May benefit from outpatient gastric emptying study.  Thrombocytosis ED likely secondary to dehydration in the setting of DKA as above.  Chronic pain syndrome Continue oxycodone 5 mg p.o. 4 times daily as needed pain.  THC use disorder UDS positive for opiates and THC on 10/11/2022.  Counsel need for complete cessation.  Anxiety/depression Cymbalta 60 mg p.o. nightly  Sinusitis Diagnosed outpatient, continue Augmentin complete 7-day course.   Discharge Diagnoses:  Active Problems:   DKA (diabetic ketoacidosis) [facility])    Discharge Instructions  Discharge Instructions     Call MD for:  difficulty breathing, headache or visual disturbances   Complete by: As directed    Call MD for:  extreme fatigue   Complete by: As directed    Call MD for:  persistant dizziness or light-headedness   Complete by: As directed    Call MD for:  persistant nausea and vomiting   Complete by: As directed    Call MD for:  severe uncontrolled pain   Complete by: As directed    Call MD for:  temperature >100.4   Complete by: As directed    Diet - low sodium heart healthy   Complete by: As directed    Increase activity slowly   Complete by: As directed       Allergies as of 10/15/2023       Reactions   Mirapex [pramipexole] Shortness Of Breath  Medication List     STOP taking these medications    Aleve 220 MG tablet Generic drug: naproxen sodium   potassium chloride SA 20 MEQ tablet Commonly known as:  KLOR-CON M       TAKE these medications    amoxicillin-clavulanate 875-125 MG tablet Commonly known as: AUGMENTIN Take 1 tablet by mouth 2 (two) times daily.   B-D UF III MINI PEN NEEDLES 31G X 5 MM Misc Generic drug: Insulin Pen Needle To use with Insulin pens, as directed What changed: Another medication with the same name was added. Make sure you understand how and when to take each.   Pen Needles 31G X 5 MM Misc 1 each by Does not apply route 3 (three) times daily. May dispense any manufacturer covered by patient's insurance. What changed: You were already taking a medication with the same name, and this prescription was added. Make sure you understand how and when to take each.   Dexcom G6 Transmitter Misc Inject 1 each into the skin every 14 (fourteen) days.   Dexcom G7 Sensor Misc Inject 1 Device into the skin See admin instructions. Place 1 new sensor into the skin every 10 days   DULoxetine 60 MG capsule Commonly known as: CYMBALTA Take 60 mg by mouth at bedtime.   insulin aspart 100 UNIT/ML FlexPen Commonly known as: NOVOLOG Inject 0-9 Units into the skin 3 (three) times daily with meals. CBG < 70: Eat or drink something sweet and recheck, CBG 70 - 120: 0 units CBG 121 - 150: 1 unit CBG 151 - 200: 2 units CBG 201 - 250: 3 units CBG 251 - 300: 5 units CBG 301 - 350: 7 units CBG 351 - 400: 9 units CBG > 400: call MD. What changed:  when to take this additional instructions   insulin glargine 100 UNIT/ML Solostar Pen Commonly known as: LANTUS Inject 12 Units into the skin daily. May substitute as needed per insurance.   MIDOL PO Take 1 tablet by mouth daily as needed (cramping).   oxyCODONE 5 MG immediate release tablet Commonly known as: Oxy IR/ROXICODONE Take 5 mg by mouth 4 (four) times daily as needed (for pain).        Follow-up Information     Elie Confer, NP. Schedule an appointment as soon as possible for a visit in 1 week(s).   Contact  information: [address] [place] [address] [phone]                Allergies  Allergen Reactions   Mirapex [Pramipexole] Shortness Of Breath    Consultations: None   Procedures/Studies: CT ABDOMEN PELVIS W CONTRAST Result Date: 10/14/2023 CLINICAL DATA:  Abdominal pain, acute, nonlocalized.  Nausea. EXAM: CT ABDOMEN AND PELVIS WITH CONTRAST TECHNIQUE: Multidetector CT imaging of the abdomen and pelvis was performed using the standard protocol following bolus administration of intravenous contrast. RADIATION DOSE REDUCTION: This exam was performed according to the departmental dose-optimization program which includes automated exposure control, adjustment of the mA and/or kV according to patient size and/or use of iterative reconstruction technique. CONTRAST:  OMNIPAQUE IOHEXOL 300 MG/ML  SOLN COMPARISON:  04/01/2023. FINDINGS: Lower chest: Minimal atelectasis or infiltrate is present at the lung bases. There are trace bilateral pleural effusions. Hepatobiliary: No focal liver abnormality is seen. Stones are present within the gallbladder. No biliary ductal dilatation is seen. Pancreas: Unremarkable. No pancreatic ductal dilatation or surrounding inflammatory changes. Spleen: Normal in size without focal abnormality. Adrenals/Urinary Tract:  The adrenal glands are within normal limits. The kidneys enhance symmetrically. No renal calculus or obstructive uropathy bilaterally. The bladder is unremarkable. Stomach/Bowel: Stomach is within normal limits. Appendix appears normal. No evidence of bowel wall thickening, distention, or inflammatory changes. No free air or pneumatosis. Vascular/Lymphatic: Aortic atherosclerosis. No enlarged abdominal or pelvic lymph nodes. Reproductive: Uterus and bilateral adnexa are unremarkable. Other: No abdominopelvic ascites. A small fat containing umbilical hernia is present. Skin thickening and subcutaneous fat stranding are noted in the mid  right abdomen, unchanged from 2024 and likely chronic. Musculoskeletal: No acute osseous abnormality. IMPRESSION: 1. No  acute intra-abdominal process. 2. Trace bilateral pleural effusions with atelectasis or infiltrate at the lung bases. 3. Cholelithiasis. 4. Aortic atherosclerosis. Electronically Signed   By: Thornell Sartorius M.D.   On: 10/14/2023 05:21   DG Chest Portable 1 View Result Date: 10/12/2023 CLINICAL DATA:  Syncopal episode today. EXAM: PORTABLE CHEST 1 VIEW COMPARISON:  05/01/2023 FINDINGS: The heart size and mediastinal contours are within normal limits. Both lungs are clear. The visualized skeletal structures are unremarkable. IMPRESSION: No active disease. Electronically Signed   By: Danae Orleans M.D.   On: 10/12/2023 13:38   CT Head Wo Contrast Result Date: 10/12/2023 CLINICAL DATA:  Provided history: Mental status change, unknown cause. Additional history provided: Patient found unconscious on floor by husband, history of type 1 diabetes. EXAM: CT HEAD WITHOUT CONTRAST TECHNIQUE: Contiguous axial images were obtained from the base of the skull through the vertex without intravenous contrast. RADIATION DOSE REDUCTION: This exam was performed according to the departmental dose-optimization program which includes automated exposure control, adjustment of the mA and/or kV according to patient size and/or use of iterative reconstruction technique. COMPARISON:  Non-contrast head CT 05/01/2023. FINDINGS: Brain: Cerebral volume is normal. There is no acute intracranial hemorrhage. No demarcated cortical infarct. No extra-axial fluid collection. No evidence of an intracranial mass. No midline shift. Vascular: No hyperdense vessel. Skull: No calvarial fracture or aggressive osseous lesion. Sinuses/Orbits: No mass or acute finding within the imaged orbits. Mild mucosal thickening within the left frontal, bilateral ethmoid, bilateral sphenoid and bilateral maxillary sinuses at the imaged levels.  IMPRESSION: 1. No evidence of an acute intracranial abnormality. 2. Mild paranasal sinus mucosal thickening at the imaged levels. Electronically Signed   By: Jackey Loge D.O.   On: 10/12/2023 13:23     Subjective: Patient seen examined bedside, lying in bed.  Tolerating diet.  Requesting discharge home this morning.  Discussed with her changes in her insulin regimen and to continue to monitor her glucose closely after discharge.  She reports being followed by her PCP and endocrinology for her diabetes.  No other specific questions, concerns or complaints at this time.  Denies headache, no dizziness, no chest pain, no palpitations, no shortness of breath, no abdominal pain, no fever/chills/night sweats, no nausea/vomiting/diarrhea, no focal weakness, no fatigue, no paresthesias.  No acute events overnight per nursing staff.  Discharge Exam: Vitals:   10/15/23 0600 10/15/23 0800  BP: 116/72   Pulse: 100   Resp: 13   Temp:  97.7 F (36.5 C)  SpO2: 97%    Vitals:   10/15/23 0400 10/15/23 0500 10/15/23 0600 10/15/23 0800  BP: 102/72 117/74 116/72   Pulse: 91 90 100   Resp: 10 12 13    Temp: 98.6 F (37 C)   97.7 F (36.5 C)  TempSrc: Oral   Oral  SpO2: 96% 99% 97%   Weight:      Height:  Physical Exam: GEN: NAD, alert and oriented x 3 HEENT: NCAT, PERRL, EOMI, sclera clear, MMM PULM: CTAB w/o wheezes/crackles, normal respiratory effort on room air CV: RRR w/o M/G/R GI: abd soft, NTND, NABS, no R/G/M MSK: no peripheral edema, muscle strength globally intact 5/5 bilateral upper/lower extremities NEURO: CN II-XII intact, no focal deficits, sensation to light touch intact PSYCH: normal mood/affect Integumentary: dry/intact, no rashes or wounds    The results of significant diagnostics from this hospitalization (including imaging, microbiology, ancillary and laboratory) are listed below for reference.     Microbiology: Recent Results (from the past 240 hours)  Resp panel  by RT-PCR (RSV, Flu A&B, Covid) Anterior Nasal Swab     Status: None   Collection Time: 10/09/23 10:56 AM   Specimen: Anterior Nasal Swab  Result Value Ref Range Status   SARS Coronavirus 2 by RT PCR NEGATIVE NEGATIVE Final    Comment: (NOTE) SARS-CoV-2 target nucleic acids are NOT DETECTED.  The SARS-CoV-2 RNA is generally detectable in upper respiratory specimens during the acute phase of infection. The lowest concentration of SARS-CoV-2 viral copies this assay can detect is 138 copies/mL. A negative result does not preclude SARS-Cov-2 infection and should not be used as the sole basis for treatment or other patient management decisions. A negative result may occur with  improper specimen collection/handling, submission of specimen other than nasopharyngeal swab, presence of viral mutation(s) within the areas targeted by this assay, and inadequate number of viral copies(<138 copies/mL). A negative result must be combined with clinical observations, patient history, and epidemiological information. The expected result is Negative.  Fact Sheet for Patients:  BloggerCourse.com  Fact Sheet for Healthcare Providers:  SeriousBroker.it  This test is no t yet approved or cleared by the [place] FDA and  has been authorized for detection and/or diagnosis of SARS-CoV-2 by FDA under an Emergency Use Authorization (EUA). This EUA will remain  in effect (meaning this test can be used) for the duration of the COVID-19 declaration under Section 564(b)(1) of the Act, 21 U.S.C.section 360bbb-3(b)(1), unless the authorization is terminated  or revoked sooner.       Influenza A by PCR NEGATIVE NEGATIVE Final   Influenza B by PCR NEGATIVE NEGATIVE Final    Comment: (NOTE) The Xpert Xpress SARS-CoV-2/FLU/RSV plus assay is intended as an aid in the diagnosis of influenza from Nasopharyngeal swab specimens and should not be used as a sole  basis for treatment. Nasal washings and aspirates are unacceptable for Xpert Xpress SARS-CoV-2/FLU/RSV testing.  Fact Sheet for Patients: BloggerCourse.com  Fact Sheet for Healthcare Providers: SeriousBroker.it  This test is not yet approved or cleared by the [place] FDA and has been authorized for detection and/or diagnosis of SARS-CoV-2 by FDA under an Emergency Use Authorization (EUA). This EUA will remain in effect (meaning this test can be used) for the duration of the COVID-19 declaration under Section 564(b)(1) of the Act, 21 U.S.C. section 360bbb-3(b)(1), unless the authorization is terminated or revoked.     Resp Syncytial Virus by PCR NEGATIVE NEGATIVE Final    Comment: (NOTE) Fact Sheet for Patients: BloggerCourse.com  Fact Sheet for Healthcare Providers: SeriousBroker.it  This test is not yet approved or cleared by the [place] FDA and has been authorized for detection and/or diagnosis of SARS-CoV-2 by FDA under an Emergency Use Authorization (EUA). This EUA will remain in effect (meaning this test can be used) for the duration of the COVID-19 declaration under Section 564(b)(1) of the Act, 21 U.S.C.  section 360bbb-3(b)(1), unless the authorization is terminated or revoked.  Performed at [facility], [address], [place], [place] [address]   MRSA Next Gen by PCR, Nasal     Status: Abnormal   Collection Time: 10/12/23  7:00 PM   Specimen: Nasal Mucosa; Nasal Swab  Result Value Ref Range Status   MRSA by PCR Next Gen DETECTED (A) NOT DETECTED Final    Comment: CRITICAL RESULT CALLED TO, READ BACK BY AND VERIFIED WITH: Daneil Dan, RN @ 2025 [facility] 10/12/23 (NOTE) The GeneXpert MRSA Assay (FDA approved for NASAL specimens only), is one component of a comprehensive MRSA colonization surveillance program. It is not intended to diagnose  MRSA infection nor to guide or monitor treatment for MRSA infections. Test performance is not FDA approved in patients less than 40 years old. Performed at [facility], [address], [place], [place] [address]      Labs: BNP (last 3 results) Recent Labs    04/02/23 0205  BNP 11.2   Basic Metabolic Panel: Recent Labs  Lab 10/12/23 1233 10/12/23 1240 10/12/23 1922 10/12/23 1930 10/12/23 2144 10/13/23 0305 10/14/23 0255  NA 130* 131* 136  --  137 136 138  K 5.1 4.9 3.7  --  3.6 3.6 3.1*  CL 89*  --  98  --  100 101 101  CO2 17*  --  24  --  28 22 27   GLUCOSE 427*  --  168*  --  121* 143* 60*  BUN 11  --  10  --  9 9 7   CREATININE 0.89  --  0.72  --  0.68 0.58 0.47  CALCIUM 10.8*  --  9.3  --  9.2 9.0 9.2  MG  --   --   --  1.8  --   --  1.7  PHOS  --   --   --  1.9*  --   --  3.9   Liver Function Tests: Recent Labs  Lab 10/09/23 1159 10/12/23 1233 10/14/23 0255  AST 8* 9* 10*  ALT 5 <5 7  ALKPHOS 106 98 56  BILITOT 0.5 0.5 0.8  PROT 8.7* 9.6* 6.4*  ALBUMIN 4.2 4.7 2.9*   No results for input(s): "LIPASE", "AMYLASE" in the last 168 hours. No results for input(s): "AMMONIA" in the last 168 hours. CBC: Recent Labs  Lab 10/09/23 1200 10/12/23 1233 10/12/23 1240 10/12/23 1930 10/13/23 0305 10/14/23 0255  WBC 9.9 7.8  --  8.7 7.6 6.9  NEUTROABS 6.9 5.3  --   --   --  3.0  HGB 13.3 14.0 15.0 11.9* 11.2* 11.2*  HCT 38.8 41.3 44.0 36.3 34.5* 33.4*  MCV 89.4 89.4  --  90.5 94.3 90.8  PLT 393 498*  --  419* 339 347   Cardiac Enzymes: No results for input(s): "CKTOTAL", "CKMB", "CKMBINDEX", "TROPONINI" in the last 168 hours. BNP: Invalid input(s): "POCBNP" CBG: Recent Labs  Lab 10/14/23 2218 10/14/23 2302 10/15/23 0003 10/15/23 0343 10/15/23 0733  GLUCAP 172* 230* 271* 178* 165*   D-Dimer No results for input(s): "DDIMER" in the last 72 hours. Hgb A1c No results for input(s): "HGBA1C" in the last 72 hours. Lipid Profile No  results for input(s): "CHOL", "HDL", "LDLCALC", "TRIG", "CHOLHDL", "LDLDIRECT" in the last 72 hours. Thyroid function studies No results for input(s): "TSH", "T4TOTAL", "T3FREE", "THYROIDAB" in the last 72 hours.  Invalid input(s): "FREET3" Anemia work up No results for input(s): "VITAMINB12", "FOLATE", "FERRITIN", "TIBC", "IRON", "RETICCTPCT"  in the last 72 hours. Urinalysis    Component Value Date/Time   COLORURINE YELLOW 10/12/2023 1515   APPEARANCEUR CLEAR 10/12/2023 1515   LABSPEC 1.030 10/12/2023 1515   PHURINE 5.5 10/12/2023 1515   GLUCOSEU >1,000 (A) 10/12/2023 1515   HGBUR TRACE (A) 10/12/2023 1515   BILIRUBINUR NEGATIVE 10/12/2023 1515   BILIRUBINUR negative 11/08/2021 1424   KETONESUR >80 (A) 10/12/2023 1515   PROTEINUR 100 (A) 10/12/2023 1515   UROBILINOGEN 0.2 11/08/2021 1424   UROBILINOGEN 0.2 05/03/2021 0837   NITRITE NEGATIVE 10/12/2023 1515   LEUKOCYTESUR NEGATIVE 10/12/2023 1515   Sepsis Labs Recent Labs  Lab 10/12/23 1233 10/12/23 1930 10/13/23 0305 10/14/23 0255  WBC 7.8 8.7 7.6 6.9   Microbiology Recent Results (from the past 240 hours)  Resp panel by RT-PCR (RSV, Flu A&B, Covid) Anterior Nasal Swab     Status: None   Collection Time: 10/09/23 10:56 AM   Specimen: Anterior Nasal Swab  Result Value Ref Range Status   SARS Coronavirus 2 by RT PCR NEGATIVE NEGATIVE Final    Comment: (NOTE) SARS-CoV-2 target nucleic acids are NOT DETECTED.  The SARS-CoV-2 RNA is generally detectable in upper respiratory specimens during the acute phase of infection. The lowest concentration of SARS-CoV-2 viral copies this assay can detect is 138 copies/mL. A negative result does not preclude SARS-Cov-2 infection and should not be used as the sole basis for treatment or other patient management decisions. A negative result may occur with  improper specimen collection/handling, submission of specimen other than nasopharyngeal swab, presence of viral mutation(s) within  the areas targeted by this assay, and inadequate number of viral copies(<138 copies/mL). A negative result must be combined with clinical observations, patient history, and epidemiological information. The expected result is Negative.  Fact Sheet for Patients:  BloggerCourse.com  Fact Sheet for Healthcare Providers:  SeriousBroker.it  This test is no t yet approved or cleared by the [place] FDA and  has been authorized for detection and/or diagnosis of SARS-CoV-2 by FDA under an Emergency Use Authorization (EUA). This EUA will remain  in effect (meaning this test can be used) for the duration of the COVID-19 declaration under Section 564(b)(1) of the Act, 21 U.S.C.section 360bbb-3(b)(1), unless the authorization is terminated  or revoked sooner.       Influenza A by PCR NEGATIVE NEGATIVE Final   Influenza B by PCR NEGATIVE NEGATIVE Final    Comment: (NOTE) The Xpert Xpress SARS-CoV-2/FLU/RSV plus assay is intended as an aid in the diagnosis of influenza from Nasopharyngeal swab specimens and should not be used as a sole basis for treatment. Nasal washings and aspirates are unacceptable for Xpert Xpress SARS-CoV-2/FLU/RSV testing.  Fact Sheet for Patients: BloggerCourse.com  Fact Sheet for Healthcare Providers: SeriousBroker.it  This test is not yet approved or cleared by the [place] FDA and has been authorized for detection and/or diagnosis of SARS-CoV-2 by FDA under an Emergency Use Authorization (EUA). This EUA will remain in effect (meaning this test can be used) for the duration of the COVID-19 declaration under Section 564(b)(1) of the Act, 21 U.S.C. section 360bbb-3(b)(1), unless the authorization is terminated or revoked.     Resp Syncytial Virus by PCR NEGATIVE NEGATIVE Final    Comment: (NOTE) Fact Sheet for  Patients: BloggerCourse.com  Fact Sheet for Healthcare Providers: SeriousBroker.it  This test is not yet approved or cleared by the [place] FDA and has been authorized for detection and/or diagnosis of SARS-CoV-2 by FDA under an Emergency Use Authorization (  EUA). This EUA will remain in effect (meaning this test can be used) for the duration of the COVID-19 declaration under Section 564(b)(1) of the Act, 21 U.S.C. section 360bbb-3(b)(1), unless the authorization is terminated or revoked.  Performed at [facility], [address], [place], [place] [address]   MRSA Next Gen by PCR, Nasal     Status: Abnormal   Collection Time: 10/12/23  7:00 PM   Specimen: Nasal Mucosa; Nasal Swab  Result Value Ref Range Status   MRSA by PCR Next Gen DETECTED (A) NOT DETECTED Final    Comment: CRITICAL RESULT CALLED TO, READ BACK BY AND VERIFIED WITH: Daneil Dan, RN @ 2025 [facility] 10/12/23 (NOTE) The GeneXpert MRSA Assay (FDA approved for NASAL specimens only), is one component of a comprehensive MRSA colonization surveillance program. It is not intended to diagnose MRSA infection nor to guide or monitor treatment for MRSA infections. Test performance is not FDA approved in patients less than 55 years old. Performed at [facility], [address], [place], [place] [address]      Time coordinating discharge: Over 30 minutes  SIGNED:   Alvira Philips [place], DO  [facility] 10/15/2023, 9:08 AM

## 2023-10-15 NOTE — Plan of Care (Signed)

## 2023-10-15 NOTE — Plan of Care (Signed)

## 2023-10-15 NOTE — TOC Transition Note (Signed)
 Transition of Care [facility]) - Discharge Note   Patient Details  Name: [name] MRN: [id number] Date of Birth: Sep 29, 1985  Transition of Care [facility]) CM/SW Contact:  Lanier Clam, RN Phone Number: 10/15/2023, 9:35 AM   Clinical Narrative: d/c home.       Final next level of care: Home/Self Care Barriers to Discharge: No Barriers Identified   Patient Goals and CMS Choice   CMS Medicare.gov Compare Post Acute Care list provided to:: Patient Choice offered to / list presented to : Patient [place] ownership interest in [facility].provided to:: Patient    Discharge Placement                       Discharge Plan and Services Additional resources added to the After Visit Summary for                                       Social Drivers of Health (SDOH) Interventions SDOH Screenings   Food Insecurity: No Food Insecurity (10/12/2023)  Housing: Low Risk  (10/12/2023)  Transportation Needs: No Transportation Needs (10/12/2023)  Utilities: Not At Risk (10/12/2023)  Depression (PHQ2-9): Low Risk  (11/08/2021)  Social Connections: Moderately Integrated (10/12/2023)  Tobacco Use: Low Risk  (10/12/2023)     Readmission Risk Interventions    10/14/2023    1:55 PM 08/07/2023   11:55 AM 05/06/2023   12:55 PM  Readmission Risk Prevention Plan  Transportation Screening Complete Complete Complete  Medication Review Oceanographer) Complete Complete Complete  PCP or Specialist appointment within 3-5 days of discharge Complete Complete   HRI or Home Care Consult Complete  Complete  SW Recovery Care/Counseling Consult Complete  Complete  Palliative Care Screening Not Applicable  Not Applicable  Skilled Nursing Facility Not Applicable  Not Applicable

## 2023-12-08 ENCOUNTER — Emergency Department (HOSPITAL_COMMUNITY)
Admission: EM | Admit: 2023-12-08 | Discharge: 2023-12-09 | Disposition: A | Discharge status: Home / Self Care | Attending: Emergency Medicine | Admitting: Emergency Medicine

## 2023-12-08 ENCOUNTER — Other Ambulatory Visit: Payer: Self-pay

## 2023-12-08 ENCOUNTER — Encounter (HOSPITAL_COMMUNITY): Payer: Self-pay

## 2023-12-08 DIAGNOSIS — Z794 Long term (current) use of insulin: Secondary | ICD-10-CM | POA: Insufficient documentation

## 2023-12-08 DIAGNOSIS — E1143 Type 2 diabetes mellitus with diabetic autonomic (poly)neuropathy: Secondary | ICD-10-CM

## 2023-12-08 DIAGNOSIS — E1010 Type 1 diabetes mellitus with ketoacidosis without coma: Secondary | ICD-10-CM | POA: Diagnosis not present

## 2023-12-08 DIAGNOSIS — E10649 Type 1 diabetes mellitus with hypoglycemia without coma: Secondary | ICD-10-CM | POA: Insufficient documentation

## 2023-12-08 DIAGNOSIS — E1043 Type 1 diabetes mellitus with diabetic autonomic (poly)neuropathy: Secondary | ICD-10-CM | POA: Diagnosis not present

## 2023-12-08 DIAGNOSIS — R109 Unspecified abdominal pain: Secondary | ICD-10-CM | POA: Diagnosis present

## 2023-12-08 LAB — BETA-HYDROXYBUTYRIC ACID
Beta-Hydroxybutyric Acid: 2.64 mmol/L — ABNORMAL HIGH (ref 0.05–0.27)
Beta-Hydroxybutyric Acid: 1.61 mmol/L — ABNORMAL HIGH (ref 0.05–0.27)

## 2023-12-08 LAB — BASIC METABOLIC PANEL WITH GFR
Anion gap: 13 (ref 5–15)
Anion gap: 11 (ref 5–15)
BUN: 17 mg/dL (ref 6–20)
BUN: 13 mg/dL (ref 6–20)
CO2: 22 mmol/L (ref 22–32)
CO2: 25 mmol/L (ref 22–32)
Calcium: 9.5 mg/dL (ref 8.9–10.3)
Calcium: 8.9 mg/dL (ref 8.9–10.3)
Chloride: 100 mmol/L (ref 98–111)
Chloride: 102 mmol/L (ref 98–111)
Creatinine, Ser: 0.71 mg/dL (ref 0.44–1.00)
Creatinine, Ser: 0.65 mg/dL (ref 0.44–1.00)
GFR, Estimated: >60 mL/min (ref >60)
GFR, Estimated: >60 mL/min (ref >60)
Glucose, Bld: 241 mg/dL — ABNORMAL HIGH (ref 70–99)
Glucose, Bld: 186 mg/dL — ABNORMAL HIGH (ref 70–99)
Potassium: 3.4 mmol/L — ABNORMAL LOW (ref 3.5–5.1)
Potassium: 3.6 mmol/L (ref 3.5–5.1)
Sodium: 135 mmol/L (ref 135–145)
Sodium: 138 mmol/L (ref 135–145)

## 2023-12-08 LAB — BLOOD GAS, VENOUS
Acid-Base Excess: 4 mmol/L — ABNORMAL HIGH (ref 0.0–2.0)
Acid-Base Excess: 1 mmol/L (ref 0.0–2.0)
Bicarbonate: 24.1 mmol/L (ref 20.0–28.0)
Bicarbonate: 27.9 mmol/L (ref 20.0–28.0)
O2 Saturation: 74.8 %
O2 Saturation: 40.1 %
Patient temperature: 37
Patient temperature: 37
pCO2, Ven: 24 mmHg — ABNORMAL LOW (ref 44–60)
pCO2, Ven: 53 mmHg (ref 44–60)
pH, Ven: 7.61 (ref 7.25–7.43)
pH, Ven: 7.33 (ref 7.25–7.43)
pO2, Ven: 38 mmHg (ref 32–45)
pO2, Ven: <31 mmHg — CL (ref 32–45)

## 2023-12-08 LAB — CBC WITH DIFFERENTIAL/PLATELET
Abs Immature Granulocytes: 0.04 10*3/uL (ref 0.00–0.07)
Basophils Absolute: 0 10*3/uL (ref 0.0–0.1)
Basophils Relative: 0 %
Eosinophils Absolute: 0 10*3/uL (ref 0.0–0.5)
Eosinophils Relative: 0 %
HCT: 39 % (ref 36.0–46.0)
Hemoglobin: 13.5 g/dL (ref 12.0–15.0)
Immature Granulocytes: 0 %
Lymphocytes Relative: 21 %
Lymphs Abs: 2.1 10*3/uL (ref 0.7–4.0)
MCH: 30.8 pg (ref 26.0–34.0)
MCHC: 34.6 g/dL (ref 30.0–36.0)
MCV: 88.8 fL (ref 80.0–100.0)
Monocytes Absolute: 0.3 10*3/uL (ref 0.1–1.0)
Monocytes Relative: 3 %
Neutro Abs: 7.4 10*3/uL (ref 1.7–7.7)
Neutrophils Relative %: 76 %
Platelets: 320 10*3/uL (ref 150–400)
RBC: 4.39 MIL/uL (ref 3.87–5.11)
RDW: 13.2 % (ref 11.5–15.5)
WBC: 10 10*3/uL (ref 4.0–10.5)
nRBC: 0 % (ref 0.0–0.2)

## 2023-12-08 LAB — CBG MONITORING, ED: Glucose-Capillary: 257 mg/dL — ABNORMAL HIGH (ref 70–99)

## 2023-12-08 LAB — HCG, SERUM, QUALITATIVE: Preg, Serum: NEGATIVE

## 2023-12-08 MED ORDER — SODIUM CHLORIDE 0.9 % IV BOLUS
1000.0000 mL | Freq: Once | INTRAVENOUS | Status: AC
  Administered 2023-12-08: 1000 mL via INTRAVENOUS

## 2023-12-08 MED ORDER — INSULIN ASPART PROT & ASPART (70-30 MIX) 100 UNIT/ML ~~LOC~~ SUSP
2.0000 [IU] | Freq: Once | SUBCUTANEOUS | Status: AC
  Administered 2023-12-08: 2 [IU] via SUBCUTANEOUS
  Filled 2023-12-08: qty 10

## 2023-12-08 MED ORDER — DEXTROSE IN LACTATED RINGERS 5 % IV SOLN: INTRAVENOUS | Status: DC

## 2023-12-08 MED ORDER — ONDANSETRON HCL 4 MG/2ML IJ SOLN
4.0000 mg | Freq: Once | INTRAMUSCULAR | Status: AC
  Administered 2023-12-08: 4 mg via INTRAVENOUS
  Filled 2023-12-08: qty 2

## 2023-12-08 MED ORDER — LACTATED RINGERS IV SOLN: INTRAVENOUS | Status: DC

## 2023-12-08 MED ORDER — MORPHINE SULFATE (PF) 4 MG/ML IV SOLN
4.0000 mg | Freq: Once | INTRAVENOUS | Status: AC
  Administered 2023-12-08: 4 mg via INTRAVENOUS
  Filled 2023-12-08: qty 1

## 2023-12-08 MED ORDER — LACTATED RINGERS IV BOLUS
20.0000 mL/kg | Freq: Once | INTRAVENOUS | Status: AC
  Administered 2023-12-08: 1418 mL via INTRAVENOUS

## 2023-12-08 MED ORDER — DEXTROSE 50 % IV SOLN: 0.0000 mL | INTRAVENOUS | Status: DC | PRN

## 2023-12-08 NOTE — ED Provider Notes (Signed)
 [place] EMERGENCY DEPARTMENT AT [facility] Provider Note   CSN: [id number] Arrival date & time: 12/08/23  1857     History  Chief Complaint  Patient presents with   Hypoglycemia   Abdominal Pain   Nausea   Emesis    [name] is a 38 y.o. female.  With a history of type 1 diabetes, DKA and noncompliance who presents to the ED for reported hypoglycemia.  Patient had initially called EMS for abdominal pain nausea and vomiting.  Initial blood sugar in the 120s.  Symptoms began earlier today and have persisted since the onset.  Several episodes of vomiting at home.  She reports compliance with her Lantus  and NovoLog .  Was last admitted for HHS at the end of February.  No fevers, chills chest pain or shortness of breath.   Hypoglycemia Associated symptoms: vomiting   Abdominal Pain Associated symptoms: vomiting   Emesis Associated symptoms: abdominal pain        Home Medications Prior to Admission medications   Medication Sig Start Date End Date Taking? Authorizing Provider  Acetaminophen  (MIDOL  PO) Take 1 tablet by mouth daily as needed (cramping).    [provider]  amoxicillin -clavulanate (AUGMENTIN ) 875-125 MG tablet Take 1 tablet by mouth 2 (two) times daily. 10/10/23   [provider]  Continuous Blood Gluc Transmit (DEXCOM G6 TRANSMITTER) MISC Inject 1 each into the skin every 14 (fourteen) days. 12/31/21   Versa Gore, NP  Continuous Glucose Sensor (DEXCOM G7 SENSOR) MISC Inject 1 Device into the skin See admin instructions. Place 1 new sensor into the skin every 10 days    [provider]  DULoxetine  (CYMBALTA ) 60 MG capsule Take 60 mg by mouth at bedtime. 06/28/22   [provider]  insulin  aspart (NOVOLOG ) 100 UNIT/ML FlexPen Inject 0-9 Units into the skin 3 (three) times daily with meals. CBG < 70: Eat or drink something sweet and recheck, CBG 70 - 120: 0 units CBG 121 - 150: 1 unit CBG 151 - 200: 2  units CBG 201 - 250: 3 units CBG 251 - 300: 5 units CBG 301 - 350: 7 units CBG 351 - 400: 9 units CBG > 400: call MD. Patient taking differently: Inject 0-9 Units into the skin See admin instructions. Inject 0-9 units into the skin 3 times a day with meals, PER SLIDING SCALE: "CBG < 70: Eat or drink something sweet and recheck, CBG 70 - 120: 0 units CBG 121 - 150: 1 unit CBG 151 - 200: 2 units CBG 201 - 250: 3 units CBG 251 - 300: 5 units CBG 301 - 350: 7 units CBG 351 - 400: 9 units CBG > 400: call MD" 08/07/23 11/05/23  Casey Clay, MD  insulin  glargine (LANTUS ) 100 UNIT/ML Solostar Pen Inject 12 Units into the skin daily. May substitute as needed per insurance. 10/15/23 01/13/24  [place], Rema Care, DO  Insulin  Pen Needle (B-D UF III MINI PEN NEEDLES) 31G X 5 MM MISC To use with Insulin  pens, as directed 08/07/23   Hongalgi, Anand D, MD  Insulin  Pen Needle (PEN NEEDLES) 31G X 5 MM MISC 1 each by Does not apply route 3 (three) times daily. May dispense any manufacturer covered by patient's insurance. 10/15/23   [place], Rema Care, DO  oxyCODONE  (OXY IR/ROXICODONE ) 5 MG immediate release tablet Take 5 mg by mouth 4 (four) times daily as needed (for pain).    [provider]  Allergies    Mirapex [pramipexole]    Review of Systems   Review of Systems  Gastrointestinal:  Positive for abdominal pain and vomiting.    Physical Exam Updated Vital Signs BP (!) 179/118   Pulse 96   Temp 97.6 F (36.4 C)   Resp (!) 21   Ht 5\' 6"  (1.676 m)   Wt 70.9 kg   SpO2 100%   BMI 25.23 kg/m  Physical Exam Vitals and nursing note reviewed.  HENT:     Head: Normocephalic and atraumatic.  Eyes:     Pupils: Pupils are equal, round, and reactive to light.  Cardiovascular:     Rate and Rhythm: Normal rate and regular rhythm.  Pulmonary:     Effort: Pulmonary effort is normal.     Breath sounds: Normal breath sounds.  Abdominal:     Palpations: Abdomen is soft.     Tenderness: There is  generalized abdominal tenderness. There is no guarding or rebound.  Skin:    General: Skin is warm and dry.  Neurological:     Mental Status: She is alert.  Psychiatric:        Mood and Affect: Mood normal.     ED Results / Procedures / Treatments   Labs (all labs ordered are listed, but only abnormal results are displayed) Labs Reviewed  BASIC METABOLIC PANEL WITH GFR - Abnormal; Notable for the following components:      Result Value   Glucose, Bld 186 (*)    All other components within normal limits  BETA-HYDROXYBUTYRIC ACID - Abnormal; Notable for the following components:   Beta-Hydroxybutyric Acid 2.64 (*)    All other components within normal limits  BETA-HYDROXYBUTYRIC ACID - Abnormal; Notable for the following components:   Beta-Hydroxybutyric Acid 1.61 (*)    All other components within normal limits  BLOOD GAS, VENOUS - Abnormal; Notable for the following components:   pH, Ven 7.61 (*)    pCO2, Ven 24 (*)    Acid-Base Excess 4.0 (*)    All other components within normal limits  BASIC METABOLIC PANEL WITH GFR - Abnormal; Notable for the following components:   Potassium 3.4 (*)    Glucose, Bld 241 (*)    All other components within normal limits  BLOOD GAS, VENOUS - Abnormal; Notable for the following components:   pO2, Ven <31 (*)    All other components within normal limits  CBG MONITORING, ED - Abnormal; Notable for the following components:   Glucose-Capillary 257 (*)    All other components within normal limits  HCG, SERUM, QUALITATIVE  CBC WITH DIFFERENTIAL/PLATELET  BASIC METABOLIC PANEL WITH GFR  BASIC METABOLIC PANEL WITH GFR  BETA-HYDROXYBUTYRIC ACID  BETA-HYDROXYBUTYRIC ACID  URINALYSIS, ROUTINE W REFLEX MICROSCOPIC  BASIC METABOLIC PANEL WITH GFR  BETA-HYDROXYBUTYRIC ACID    EKG EKG Interpretation Date/Time:  Monday December 08 2023 20:13:27 EDT Ventricular Rate:  86 PR Interval:  142 QRS Duration:  80 QT Interval:  418 QTC  Calculation: 500 R Axis:   83  Text Interpretation: Sinus arrhythmia Borderline prolonged QT interval Confirmed by Rafael Bun [phone]) on 12/09/2023 12:33:37 AM  Radiology No results found.  Procedures Procedures    Medications Ordered in ED Medications  lactated ringers  infusion ( Intravenous Canceled Entry 12/08/23 2312)  dextrose  5 % in lactated ringers  infusion (0 mLs Intravenous Hold 12/08/23 2219)  dextrose  50 % solution 0-50 mL (has no administration in time range)  lactated ringers  bolus 1,418 mL (0 mLs Intravenous  Stopped 12/08/23 2219)  morphine  (PF) 4 MG/ML injection 4 mg (4 mg Intravenous Given 12/08/23 2004)  ondansetron  (ZOFRAN ) injection 4 mg (4 mg Intravenous Given 12/08/23 2004)  morphine  (PF) 4 MG/ML injection 4 mg (4 mg Intravenous Given 12/08/23 2138)  sodium chloride  0.9 % bolus 1,000 mL (0 mLs Intravenous Stopped 12/08/23 2337)  insulin  aspart protamine- aspart (NOVOLOG  MIX 70/30) injection 2 Units (2 Units Subcutaneous Given 12/08/23 2353)  haloperidol  lactate (HALDOL ) injection 2 mg (2 mg Intravenous Given 12/09/23 0031)    ED Course/ Medical Decision Making/ A&P Clinical Course as of 12/09/23 0034  Mon Dec 08, 2023  2013 Initial CBG of 257.  Low suspicion for DKA at this time [MP]  2228 Patient has remained stable here.  No evidence of DKA on blood gas more consistent with respiratory alkalosis.  Remainder of laboratory workup looks okay.  Will repeat venous blood gas now [MP]  Tue Dec 09, 2023  0018 Repeat VBG shows CO2 pH within normal limits.  Patient still having some nausea and abdominal pain.  Will trial Haldol  [MP]  0034 I, Rafael Bun DO, am transitioning care of this patient to the oncoming provider pending reevaluation after Haldol  and disposition [MP]    Clinical Course User Index [MP] Sallyanne Creamer, DO                                 Medical Decision Making 38 year old female with history as above including longstanding history of type 1  diabetes that is uncontrolled and DKA who presents to the ED for reported hypoglycemia.  Was found to have blood sugar in the 120s by EMS who gave D10 prior to arrival.  Abdominal pain nausea vomiting started today.  Uncomfortable with persistent abdominal pain nausea vomiting on my exam.  Presentation most concerning for recurrent HHS/DKA, uncontrolled blood glucose in the setting of medication noncompliance or viral GI illness will obtain laboratory workup initiate IV fluids continue to monitor here  Amount and/or Complexity of Data Reviewed Labs: ordered.  Risk Prescription drug management.           Final Clinical Impression(s) / ED Diagnoses Final diagnoses:  Diabetic gastroparesis [facility])    Rx / DC Orders ED Discharge Orders     None         Sallyanne Creamer, DO 12/09/23 0034

## 2023-12-08 NOTE — ED Triage Notes (Signed)
 Per EMS pt presents to the ED with complaints of hypoglycemia. Pts presented clammy and cool to touch, initial BGL was 122 D10 given BGL 270. Normal BGL in 300's.

## 2023-12-09 ENCOUNTER — Emergency Department (HOSPITAL_COMMUNITY)

## 2023-12-09 LAB — URINALYSIS, ROUTINE W REFLEX MICROSCOPIC
Bacteria, UA: NONE SEEN
Bilirubin Urine: NEGATIVE
Glucose, UA: >=500 mg/dL — AB
Ketones, ur: 20 mg/dL — AB
Leukocytes,Ua: NEGATIVE
Nitrite: NEGATIVE
Protein, ur: 100 mg/dL — AB
Specific Gravity, Urine: 1.008 (ref 1.005–1.030)
pH: 7 (ref 5.0–8.0)

## 2023-12-09 LAB — BASIC METABOLIC PANEL WITH GFR
Anion gap: 11 (ref 5–15)
Anion gap: 13 (ref 5–15)
BUN: 12 mg/dL (ref 6–20)
BUN: 13 mg/dL (ref 6–20)
CO2: 25 mmol/L (ref 22–32)
CO2: 22 mmol/L (ref 22–32)
Calcium: 8.7 mg/dL — ABNORMAL LOW (ref 8.9–10.3)
Calcium: 8.6 mg/dL — ABNORMAL LOW (ref 8.9–10.3)
Chloride: 95 mmol/L — ABNORMAL LOW (ref 98–111)
Chloride: 94 mmol/L — ABNORMAL LOW (ref 98–111)
Creatinine, Ser: 0.62 mg/dL (ref 0.44–1.00)
Creatinine, Ser: 0.64 mg/dL (ref 0.44–1.00)
GFR, Estimated: >60 mL/min (ref >60)
GFR, Estimated: >60 mL/min (ref >60)
Glucose, Bld: 186 mg/dL — ABNORMAL HIGH (ref 70–99)
Glucose, Bld: 201 mg/dL — ABNORMAL HIGH (ref 70–99)
Potassium: 3.9 mmol/L (ref 3.5–5.1)
Potassium: 3.4 mmol/L — ABNORMAL LOW (ref 3.5–5.1)
Sodium: 131 mmol/L — ABNORMAL LOW (ref 135–145)
Sodium: 129 mmol/L — ABNORMAL LOW (ref 135–145)

## 2023-12-09 LAB — CBG MONITORING, ED
Glucose-Capillary: 189 mg/dL — ABNORMAL HIGH (ref 70–99)
Glucose-Capillary: 176 mg/dL — ABNORMAL HIGH (ref 70–99)

## 2023-12-09 LAB — BETA-HYDROXYBUTYRIC ACID: Beta-Hydroxybutyric Acid: 2.4 mmol/L — ABNORMAL HIGH (ref 0.05–0.27)

## 2023-12-09 MED ORDER — IOHEXOL 300 MG/ML  SOLN
100.0000 mL | Freq: Once | INTRAMUSCULAR | Status: AC | PRN
  Administered 2023-12-09: 100 mL via INTRAVENOUS

## 2023-12-09 MED ORDER — HYDROMORPHONE HCL 1 MG/ML IJ SOLN
0.5000 mg | Freq: Once | INTRAMUSCULAR | Status: AC
  Administered 2023-12-09: 0.5 mg via INTRAVENOUS
  Filled 2023-12-09: qty 1

## 2023-12-09 MED ORDER — SODIUM CHLORIDE 0.9 % IV SOLN
12.5000 mg | Freq: Once | INTRAVENOUS | Status: AC
  Administered 2023-12-09: 12.5 mg via INTRAVENOUS
  Filled 2023-12-09: qty 12.5

## 2023-12-09 MED ORDER — PROMETHAZINE HCL 25 MG PO TABS: 25.0000 mg | ORAL_TABLET | Freq: Four times a day (QID) | ORAL | 0 refills | Status: DC | PRN

## 2023-12-09 MED ORDER — HALOPERIDOL LACTATE 5 MG/ML IJ SOLN
2.0000 mg | Freq: Once | INTRAMUSCULAR | Status: AC
  Administered 2023-12-09: 2 mg via INTRAVENOUS
  Filled 2023-12-09: qty 1

## 2023-12-09 MED ORDER — METOCLOPRAMIDE HCL 5 MG/ML IJ SOLN
10.0000 mg | Freq: Once | INTRAMUSCULAR | Status: AC
  Administered 2023-12-09: 10 mg via INTRAVENOUS
  Filled 2023-12-09: qty 2

## 2023-12-09 MED ORDER — PANTOPRAZOLE SODIUM 40 MG IV SOLR
40.0000 mg | Freq: Once | INTRAVENOUS | Status: AC
  Administered 2023-12-09: 40 mg via INTRAVENOUS
  Filled 2023-12-09: qty 10

## 2023-12-09 NOTE — ED Notes (Signed)
Gave pt ginger ale.  

## 2023-12-09 NOTE — ED Notes (Signed)
 Patient reports pain decreased to a 5. No nausea/vomiting. EDP notified.

## 2023-12-09 NOTE — ED Provider Notes (Signed)
  Physical Exam  BP 128/84 (BP Location: Right Arm)   Pulse 75   Temp 98.6 F (37 C) (Oral)   Resp 16   Ht 5\' 6"  (1.676 m)   Wt 70.9 kg   LMP 12/02/2023 Comment: negative HCG 12/08/23  SpO2 100%   BMI 25.23 kg/m   Physical Exam  Procedures  Procedures  ED Course / MDM   Clinical Course as of 12/09/23 1352  Mon Dec 08, 2023  2013 Initial CBG of 257.  Low suspicion for DKA at this time [MP]  2228 Patient has remained stable here.  No evidence of DKA on blood gas more consistent with respiratory alkalosis.  Remainder of laboratory workup looks okay.  Will repeat venous blood gas now [MP]  Tue Dec 09, 2023  0018 Repeat VBG shows CO2 pH within normal limits.  Patient still having some nausea and abdominal pain.  Will trial Haldol  [MP]  0034 Teresia Fennel DO, am transitioning care of this patient to the oncoming provider pending reevaluation after Haldol  and disposition [MP]  0716 Assumed care from Dr Alison Irvine. 38 yo F hx of DM1 who presented with possible gastroparesis vs cannabinoid hyperemesis. Pending CT scan.  [RP]  [phone] Patient had a episode of dark emesis.  Does not appear to be grossly bloody.  Complaining of more pain so we will give her pain nausea medicine right now. [RP]  1350 CT scan without acute findings.  Patient states that her pain is now under control.  She is tolerating p.o.  Will discharge her home with antiemetics and instructions to follow-up with her primary doctor for her frequent nausea and vomiting. [RP]    Clinical Course User Index [MP] Sallyanne Creamer, DO [RP] Ninetta Basket, MD   Medical Decision Making Amount and/or Complexity of Data Reviewed Labs: ordered. Radiology: ordered.  Risk Prescription drug management.     Ninetta Basket, MD 12/09/23 1352

## 2023-12-09 NOTE — ED Provider Notes (Signed)
 Care assumed from Dr. Ranelle Buys. diabetic care with nausea, vomiting, abdominal pain with concern for gastroparesis flare.  Abdomen soft on reexam.  Labs show hyperglycemia without DKA. Beta hydroxybutyrate appears chronically elevated. VBG showed normal pH.  Small ketones in urine with normal anion gap.  CT pending at shift change.  Patient with no further vomiting.  Abdomen soft without peritoneal signs. Will repeat CBG and chemistry.  Care to be transferred at shift change to Dr. Elfredia Grippe, MD 12/09/23 [phone]

## 2023-12-09 NOTE — Discharge Instructions (Addendum)
 You were seen in the emergency department for abdominal pain nausea and vomiting You did not appear to be in DKA here Your symptoms resolved after several medications for pain and nausea here Is important that you continue taking all previous prescribed medications Follow-up with your primary care doctor in 1 week for reevaluation Return to the emergency department for severe pain, swelling nausea vomiting or for any other concerns

## 2023-12-09 NOTE — ED Notes (Signed)
 Patient resting in bed with eyes closed. No acute distress. No c/o nausea/vomiting

## 2023-12-09 NOTE — Inpatient Diabetes Management (Signed)
 Inpatient Diabetes Program Recommendations  AACE/ADA: New Consensus Statement on Inpatient Glycemic Control (2015)  Target Ranges:  Prepandial:   less than 140 mg/dL      Peak postprandial:   less than 180 mg/dL (1-2 hours)      Critically ill patients:  140 - 180 mg/dL   Lab Results  Component Value Date   GLUCAP 189 (H) 12/09/2023   HGBA1C 9.4 (H) 08/27/2023    Review of Glycemic Control  Latest Reference Range & Units 12/08/23 20:06 12/09/23 03:38  Glucose-Capillary 70 - 99 mg/dL 562 (H) 130 (H)  (H): Data is abnormally high Diabetes history: Type 1 DM Outpatient Diabetes medications: Novolog  0-9 units TID, Lantus  12 units QD Current orders for Inpatient glycemic control: none  Inpatient Diabetes Program Recommendations:    Consider Semglee  8 units every day and Novolog  0-6 units Q4H.  Thanks, Marjo Sievert, MSN, RNC-OB Diabetes Coordinator [phone] (8a-5p)

## 2023-12-10 ENCOUNTER — Encounter (HOSPITAL_BASED_OUTPATIENT_CLINIC_OR_DEPARTMENT_OTHER): Payer: Self-pay | Admitting: Emergency Medicine

## 2023-12-10 ENCOUNTER — Emergency Department (HOSPITAL_BASED_OUTPATIENT_CLINIC_OR_DEPARTMENT_OTHER)
Admission: EM | Admit: 2023-12-10 | Discharge: 2023-12-10 | Disposition: A | Discharge status: Home / Self Care | Attending: Emergency Medicine | Admitting: Emergency Medicine

## 2023-12-10 ENCOUNTER — Other Ambulatory Visit: Payer: Self-pay

## 2023-12-10 DIAGNOSIS — E1065 Type 1 diabetes mellitus with hyperglycemia: Secondary | ICD-10-CM | POA: Diagnosis not present

## 2023-12-10 DIAGNOSIS — R112 Nausea with vomiting, unspecified: Secondary | ICD-10-CM | POA: Insufficient documentation

## 2023-12-10 DIAGNOSIS — Z794 Long term (current) use of insulin: Secondary | ICD-10-CM | POA: Diagnosis not present

## 2023-12-10 DIAGNOSIS — R109 Unspecified abdominal pain: Secondary | ICD-10-CM | POA: Diagnosis present

## 2023-12-10 DIAGNOSIS — R1084 Generalized abdominal pain: Secondary | ICD-10-CM | POA: Insufficient documentation

## 2023-12-10 LAB — I-STAT VENOUS BLOOD GAS, ED
Acid-Base Excess: 0 mmol/L (ref 0.0–2.0)
Bicarbonate: 25.2 mmol/L (ref 20.0–28.0)
Calcium, Ion: 1.16 mmol/L (ref 1.15–1.40)
HCT: 43 % (ref 36.0–46.0)
Hemoglobin: 14.6 g/dL (ref 12.0–15.0)
O2 Saturation: 66 %
Patient temperature: 98.8
Potassium: 3.4 mmol/L — ABNORMAL LOW (ref 3.5–5.1)
Sodium: 136 mmol/L (ref 135–145)
TCO2: 26 mmol/L (ref 22–32)
pCO2, Ven: 40.9 mmHg — ABNORMAL LOW (ref 44–60)
pH, Ven: 7.398 (ref 7.25–7.43)
pO2, Ven: 34 mmHg (ref 32–45)

## 2023-12-10 LAB — CBC WITH DIFFERENTIAL/PLATELET
Abs Immature Granulocytes: 0.03 10*3/uL (ref 0.00–0.07)
Basophils Absolute: 0 10*3/uL (ref 0.0–0.1)
Basophils Relative: 0 %
Eosinophils Absolute: 0 10*3/uL (ref 0.0–0.5)
Eosinophils Relative: 0 %
HCT: 39.5 % (ref 36.0–46.0)
Hemoglobin: 13.6 g/dL (ref 12.0–15.0)
Immature Granulocytes: 0 %
Lymphocytes Relative: 11 %
Lymphs Abs: 1 10*3/uL (ref 0.7–4.0)
MCH: 30.9 pg (ref 26.0–34.0)
MCHC: 34.4 g/dL (ref 30.0–36.0)
MCV: 89.8 fL (ref 80.0–100.0)
Monocytes Absolute: 0.2 10*3/uL (ref 0.1–1.0)
Monocytes Relative: 3 %
Neutro Abs: 7.9 10*3/uL — ABNORMAL HIGH (ref 1.7–7.7)
Neutrophils Relative %: 86 %
Platelets: 312 10*3/uL (ref 150–400)
RBC: 4.4 MIL/uL (ref 3.87–5.11)
RDW: 13.5 % (ref 11.5–15.5)
WBC: 9.1 10*3/uL (ref 4.0–10.5)
nRBC: 0 % (ref 0.0–0.2)

## 2023-12-10 LAB — HCG, SERUM, QUALITATIVE: Preg, Serum: NEGATIVE

## 2023-12-10 LAB — BETA-HYDROXYBUTYRIC ACID: Beta-Hydroxybutyric Acid: 3.77 mmol/L — ABNORMAL HIGH (ref 0.05–0.27)

## 2023-12-10 LAB — COMPREHENSIVE METABOLIC PANEL WITH GFR
ALT: 11 U/L (ref 0–44)
AST: 15 U/L (ref 15–41)
Albumin: 4.5 g/dL (ref 3.5–5.0)
Alkaline Phosphatase: 109 U/L (ref 38–126)
Anion gap: 17 — ABNORMAL HIGH (ref 5–15)
BUN: 16 mg/dL (ref 6–20)
CO2: 24 mmol/L (ref 22–32)
Calcium: 9.8 mg/dL (ref 8.9–10.3)
Chloride: 96 mmol/L — ABNORMAL LOW (ref 98–111)
Creatinine, Ser: 0.78 mg/dL (ref 0.44–1.00)
GFR, Estimated: >60 mL/min (ref >60)
Glucose, Bld: 226 mg/dL — ABNORMAL HIGH (ref 70–99)
Potassium: 3.5 mmol/L (ref 3.5–5.1)
Sodium: 137 mmol/L (ref 135–145)
Total Bilirubin: 0.4 mg/dL (ref 0.0–1.2)
Total Protein: 7.9 g/dL (ref 6.5–8.1)

## 2023-12-10 LAB — CBG MONITORING, ED: Glucose-Capillary: 206 mg/dL — ABNORMAL HIGH (ref 70–99)

## 2023-12-10 LAB — LIPASE, BLOOD: Lipase: 18 U/L (ref 11–51)

## 2023-12-10 MED ORDER — DICYCLOMINE HCL 10 MG/ML IM SOLN
20.0000 mg | Freq: Once | INTRAMUSCULAR | Status: AC
  Administered 2023-12-10: 20 mg via INTRAMUSCULAR
  Filled 2023-12-10: qty 2

## 2023-12-10 MED ORDER — METOCLOPRAMIDE HCL 5 MG/ML IJ SOLN
10.0000 mg | Freq: Once | INTRAMUSCULAR | Status: AC
  Administered 2023-12-10: 10 mg via INTRAVENOUS
  Filled 2023-12-10: qty 2

## 2023-12-10 MED ORDER — HYDROMORPHONE HCL 1 MG/ML IJ SOLN
1.0000 mg | Freq: Once | INTRAMUSCULAR | Status: AC
  Administered 2023-12-10: 1 mg via INTRAVENOUS
  Filled 2023-12-10: qty 1

## 2023-12-10 MED ORDER — DIPHENHYDRAMINE HCL 50 MG/ML IJ SOLN
12.5000 mg | Freq: Once | INTRAMUSCULAR | Status: AC
  Administered 2023-12-10: 12.5 mg via INTRAVENOUS
  Filled 2023-12-10: qty 1

## 2023-12-10 MED ORDER — LACTATED RINGERS IV BOLUS
1000.0000 mL | Freq: Once | INTRAVENOUS | Status: AC
  Administered 2023-12-10: 1000 mL via INTRAVENOUS

## 2023-12-10 MED ORDER — CAPSAICIN 0.025 % EX CREA
TOPICAL_CREAM | Freq: Once | CUTANEOUS | Status: DC
Start: 2023-12-10 — End: 2023-12-10
  Filled 2023-12-10: qty 60

## 2023-12-10 MED ORDER — ONDANSETRON HCL 4 MG/2ML IJ SOLN
4.0000 mg | Freq: Once | INTRAMUSCULAR | Status: AC
  Administered 2023-12-10: 4 mg via INTRAVENOUS
  Filled 2023-12-10: qty 2

## 2023-12-10 MED ORDER — METOCLOPRAMIDE HCL 10 MG PO TABS: 10.0000 mg | ORAL_TABLET | Freq: Four times a day (QID) | ORAL | 0 refills | Status: DC | PRN

## 2023-12-10 NOTE — ED Notes (Signed)
 Attempted IV access x2 for antiemetic administration.

## 2023-12-10 NOTE — ED Notes (Signed)
 Brought patient back to room in wheelchair.  Patient able to ambulate to chair in lobby, also ambulated from wheelchair to bed when arrived to room.  NAD at this time. Patient resting in bed.

## 2023-12-10 NOTE — ED Triage Notes (Signed)
 Patient arrives POV complaining of abdominal pain associated with nausea and vomiting since yesterday.

## 2023-12-10 NOTE — ED Notes (Signed)
 Dc instructions reviewed with patient. Patient voiced understanding. Dc with belongings.

## 2023-12-10 NOTE — ED Provider Notes (Signed)
 [place] EMERGENCY DEPARTMENT AT [facility] Provider Note   CSN: [id number] Arrival date & time: 12/10/23  0865     History  Chief Complaint  Patient presents with   Emesis    [name] is a 38 y.o. female.  HPI      38yo female with history of type 1 DM, gastroparesis, frequent admissions and emergency department visits for nausea, vomiting, abdominal pain, DKA, who presents with concern for abdominal pain, nauseas and vomiting.  Has had 7 ED visits and 3 admission in last 6 months.  Was last seen late 4/21-early 4/22 with these symptoms at [facility]. Was initially feeling improved however began to have recurrence of symptoms bringing her to the ED early this morning.    Has had many episodes of emesis. No diarrhea or constipation. Is passing flatus, no urinary symptoms, no fever. Did have chills but not sure if with nausea. Does not the promethazine  was helping as outpatient.  Sometimes uses marijuana.   Past Medical History:  Diagnosis Date   Chronic constipation 11/08/2021   Diabetic neuropathy (HCC)    History of chicken pox    History of noncompliance with medical treatment 09/07/2022   Type 1 diabetes (HCC)    Vaginal odor 11/08/2021    Home Medications Prior to Admission medications   Medication Sig Start Date End Date Taking? Authorizing Provider  metoCLOPramide  (REGLAN ) 10 MG tablet Take 1 tablet (10 mg total) by mouth every 6 (six) hours as needed for nausea or vomiting. 12/10/23  Yes Scarlette Currier, MD  Acetaminophen  (MIDOL  PO) Take 1 tablet by mouth daily as needed (cramping).    [provider]  amoxicillin -clavulanate (AUGMENTIN ) 875-125 MG tablet Take 1 tablet by mouth 2 (two) times daily. 10/10/23   [provider]  Continuous Blood Gluc Transmit (DEXCOM G6 TRANSMITTER) MISC Inject 1 each into the skin every 14 (fourteen) days. 12/31/21   Versa Gore, NP  Continuous Glucose Sensor (DEXCOM G7 SENSOR) MISC Inject 1  Device into the skin See admin instructions. Place 1 new sensor into the skin every 10 days    [provider]  DULoxetine  (CYMBALTA ) 60 MG capsule Take 60 mg by mouth at bedtime. 06/28/22   [provider]  insulin  aspart (NOVOLOG ) 100 UNIT/ML FlexPen Inject 0-9 Units into the skin 3 (three) times daily with meals. CBG < 70: Eat or drink something sweet and recheck, CBG 70 - 120: 0 units CBG 121 - 150: 1 unit CBG 151 - 200: 2 units CBG 201 - 250: 3 units CBG 251 - 300: 5 units CBG 301 - 350: 7 units CBG 351 - 400: 9 units CBG > 400: call MD. Patient taking differently: Inject 0-9 Units into the skin See admin instructions. Inject 0-9 units into the skin 3 times a day with meals, PER SLIDING SCALE: "CBG < 70: Eat or drink something sweet and recheck, CBG 70 - 120: 0 units CBG 121 - 150: 1 unit CBG 151 - 200: 2 units CBG 201 - 250: 3 units CBG 251 - 300: 5 units CBG 301 - 350: 7 units CBG 351 - 400: 9 units CBG > 400: call MD" 08/07/23 11/05/23  Casey Clay, MD  insulin  glargine (LANTUS ) 100 UNIT/ML Solostar Pen Inject 12 Units into the skin daily. May substitute as needed per insurance. 10/15/23 01/13/24  [place], [name], DO  Insulin  Pen Needle (B-D UF III MINI PEN NEEDLES) 31G X 5 MM MISC To use with Insulin   pens, as directed 08/07/23   Hongalgi, Anand D, MD  Insulin  Pen Needle (PEN NEEDLES) 31G X 5 MM MISC 1 each by Does not apply route 3 (three) times daily. May dispense any manufacturer covered by patient's insurance. 10/15/23   [place], Rema Care, DO  oxyCODONE  (OXY IR/ROXICODONE ) 5 MG immediate release tablet Take 5 mg by mouth 4 (four) times daily as needed (for pain).    [provider]  promethazine  (PHENERGAN ) 25 MG tablet Take 1 tablet (25 mg total) by mouth every 6 (six) hours as needed for nausea or vomiting. 12/09/23   Rancour, Mara [place]in[place], MD      Allergies    Mirapex [pramipexole]    Review of Systems   Review of Systems  Physical Exam Updated Vital Signs BP  (!) 137/93   Pulse (!) 111   Temp 98.8 F (37.1 C) (Oral)   Resp 18   Ht 5\' 6"  (1.676 m)   Wt 74.8 kg   LMP 12/02/2023 Comment: negative HCG 12/08/23  SpO2 100%   BMI 26.63 kg/m  Physical Exam Vitals and nursing note reviewed.  Constitutional:      General: She is not in acute distress.    Appearance: She is well-developed. She is not diaphoretic.  HENT:     Head: Normocephalic and atraumatic.  Eyes:     Conjunctiva/sclera: Conjunctivae normal.  Cardiovascular:     Rate and Rhythm: Normal rate and regular rhythm.     Heart sounds: Normal heart sounds. No murmur heard.    No friction rub. No gallop.  Pulmonary:     Effort: Pulmonary effort is normal. No respiratory distress.     Breath sounds: Normal breath sounds. No wheezing or rales.  Abdominal:     General: There is no distension.     Palpations: Abdomen is soft.     Tenderness: There is abdominal tenderness (diffuse). There is no guarding.  Musculoskeletal:        General: No tenderness.     Cervical back: Normal range of motion.  Skin:    General: Skin is warm and dry.     Findings: No erythema or rash.  Neurological:     Mental Status: She is alert and oriented to person, place, and time.     ED Results / Procedures / Treatments   Labs (all labs ordered are listed, but only abnormal results are displayed) Labs Reviewed  CBC WITH DIFFERENTIAL/PLATELET - Abnormal; Notable for the following components:      Result Value   Neutro Abs 7.9 (*)    All other components within normal limits  COMPREHENSIVE METABOLIC PANEL WITH GFR - Abnormal; Notable for the following components:   Chloride 96 (*)    Glucose, Bld 226 (*)    Anion gap 17 (*)    All other components within normal limits  BETA-HYDROXYBUTYRIC ACID - Abnormal; Notable for the following components:   Beta-Hydroxybutyric Acid 3.77 (*)    All other components within normal limits  CBG MONITORING, ED - Abnormal; Notable for the following components:    Glucose-Capillary 206 (*)    All other components within normal limits  I-STAT VENOUS BLOOD GAS, ED - Abnormal; Notable for the following components:   pCO2, Ven 40.9 (*)    Potassium 3.4 (*)    All other components within normal limits  HCG, SERUM, QUALITATIVE  LIPASE, BLOOD    EKG EKG Interpretation Date/Time:  Wednesday December 10 2023 10:22:18 EDT Ventricular Rate:  108 PR Interval:  148 QRS Duration:  74 QT Interval:  355 QTC Calculation: 476 R Axis:   74  Text Interpretation: Sinus tachycardia Probable left atrial enlargement No significant change since last tracing Confirmed by Scarlette Currier ([id number]) on 12/10/2023 11:13:05 AM  Radiology CT ABDOMEN PELVIS W CONTRAST Result Date: 12/09/2023 CLINICAL DATA:  Abdominal pain. EXAM: CT ABDOMEN AND PELVIS WITH CONTRAST TECHNIQUE: Multidetector CT imaging of the abdomen and pelvis was performed using the standard protocol following bolus administration of intravenous contrast. RADIATION DOSE REDUCTION: This exam was performed according to the departmental dose-optimization program which includes automated exposure control, adjustment of the mA and/or kV according to patient size and/or use of iterative reconstruction technique. CONTRAST:  OMNIPAQUE  IOHEXOL  300 MG/ML  SOLN COMPARISON:  CT abdomen pelvis dated 10/14/2023. FINDINGS: Lower chest: The visualized lung bases are clear. No intra-abdominal free air or free fluid. Hepatobiliary: The liver is unremarkable. No biliary dilatation. There is sludge or noncalcified stone in the gallbladder. No pericholecystic fluid or evidence of acute cholecystitis by CT. Pancreas: Unremarkable. No pancreatic ductal dilatation or surrounding inflammatory changes. Spleen: Normal in size without focal abnormality. Adrenals/Urinary Tract: The adrenal glands are unremarkable. The kidneys, visualized ureters, and urinary bladder appear unremarkable. Stomach/Bowel: There is no bowel obstruction or active  inflammation. The appendix is normal. Vascular/Lymphatic: The abdominal aorta and IVC are unremarkable. No portal venous gas. There is no adenopathy. Reproductive: The uterus is anteverted. No suspicious adnexal masses. A 1.5 cm right ovarian corpus luteum. Other: Chronic thickened appearance of the umbilical fat. No fluid collection. Musculoskeletal: No acute or significant osseous findings. IMPRESSION: 1. No acute intra-abdominal or pelvic pathology. 2. Sludge or noncalcified stone in the gallbladder. No CT evidence of acute cholecystitis. Electronically Signed   By: Angus Bark M.D.   On: 12/09/2023 09:54    Procedures Procedures    Medications Ordered in ED Medications  lactated ringers  bolus 1,000 mL (1,000 mLs Intravenous New Bag/Given 12/10/23 0838)  metoCLOPramide  (REGLAN ) injection 10 mg (10 mg Intravenous Given 12/10/23 6045)  diphenhydrAMINE  (BENADRYL ) injection 12.5 mg (12.5 mg Intravenous Given 12/10/23 4098)  HYDROmorphone  (DILAUDID ) injection 1 mg (1 mg Intravenous Given 12/10/23 1016)  ondansetron  (ZOFRAN ) injection 4 mg (4 mg Intravenous Given 12/10/23 1033)  lactated ringers  bolus 1,000 mL (1,000 mLs Intravenous New Bag/Given 12/10/23 1030)  dicyclomine  (BENTYL ) injection 20 mg (20 mg Intramuscular Given 12/10/23 1025)    ED Course/ Medical Decision Making/ A&P                                  38yo female with history of type 1 DM, gastroparesis, frequent admissions and emergency department visits for nausea, vomiting, abdominal pain, DKA, who presents with concern for abdominal pain, nauseas and vomiting.   Her abdominal exam is benign, no distention, low clinical suspicion for small bowel obstruction, acute intra-abdominal infection or surgical emergency.  CT yesterday showed no acute findings. Did have sludge or noncalcified stone in GB without signs of cholecystitis and clinically have low suspicion for cholecystitis.    Differential diagnosis continues to include DKA,  HHS, cannabinoid hyperemesis syndrome, gastroparesis, other cyclic vomiting syndrome, electrolyte abnormalities.    Labs completed and personally evaluate interpreted by me show mild hyperglycemia in the 220s, does have elevated anion gap and beta hydroxy butyrate acid, however with normal bicarb of 24, normal pH, and does not meet criteria for DKA.  Pregnancy test is negative.  CBC  shows no anemia or leukocytosis.  No other clinically significant electrolyte abnormalities.  Lipase is within normal limits and have low suspicion for pancreatitis.  Feels improved following nausea, pain medication and fluids.  She is tolerating p.o.  Recommend follow-up with her gastroenterologist and PCP. Given rx for reglan . Patient discharged in stable condition with understanding of reasons to return.         Final Clinical Impression(s) / ED Diagnoses Final diagnoses:  Generalized abdominal pain  Nausea and vomiting, unspecified vomiting type    Rx / DC Orders ED Discharge Orders          Ordered    metoCLOPramide  (REGLAN ) 10 MG tablet  Every 6 hours PRN        12/10/23 1114              Scarlette Currier, MD 12/10/23 2225

## 2023-12-10 NOTE — ED Notes (Signed)
 Was called out to the lobby by registration for patient in the floor. [name] and I went out to check on the patient. Patient's family member was holding her while she was in the floor and reports that she possibly hit her head on the wall. We assisted patient to couch in lobby and laid her down. Family member sitting beside her while we plan a room for her. Patient awake and appears tired. FSBS 206 at time of triage.

## 2024-01-16 ENCOUNTER — Other Ambulatory Visit: Payer: Self-pay

## 2024-01-16 ENCOUNTER — Emergency Department (HOSPITAL_BASED_OUTPATIENT_CLINIC_OR_DEPARTMENT_OTHER)
Admission: EM | Admit: 2024-01-16 | Discharge: 2024-01-17 | Disposition: A | Discharge status: Home / Self Care | Attending: Emergency Medicine | Admitting: Emergency Medicine

## 2024-01-16 ENCOUNTER — Encounter (HOSPITAL_BASED_OUTPATIENT_CLINIC_OR_DEPARTMENT_OTHER): Payer: Self-pay

## 2024-01-16 DIAGNOSIS — E1065 Type 1 diabetes mellitus with hyperglycemia: Secondary | ICD-10-CM | POA: Diagnosis not present

## 2024-01-16 DIAGNOSIS — R1115 Cyclical vomiting syndrome unrelated to migraine: Secondary | ICD-10-CM | POA: Insufficient documentation

## 2024-01-16 DIAGNOSIS — E876 Hypokalemia: Secondary | ICD-10-CM | POA: Diagnosis not present

## 2024-01-16 DIAGNOSIS — R112 Nausea with vomiting, unspecified: Secondary | ICD-10-CM | POA: Diagnosis present

## 2024-01-16 DIAGNOSIS — F1290 Cannabis use, unspecified, uncomplicated: Secondary | ICD-10-CM | POA: Diagnosis not present

## 2024-01-16 DIAGNOSIS — Z794 Long term (current) use of insulin: Secondary | ICD-10-CM | POA: Insufficient documentation

## 2024-01-16 DIAGNOSIS — E860 Dehydration: Secondary | ICD-10-CM | POA: Diagnosis not present

## 2024-01-16 DIAGNOSIS — Z79899 Other long term (current) drug therapy: Secondary | ICD-10-CM | POA: Diagnosis not present

## 2024-01-16 LAB — CBC
HCT: 41.2 % (ref 36.0–46.0)
Hemoglobin: 14.2 g/dL (ref 12.0–15.0)
MCH: 30.5 pg (ref 26.0–34.0)
MCHC: 34.5 g/dL (ref 30.0–36.0)
MCV: 88.4 fL (ref 80.0–100.0)
Platelets: 352 10*3/uL (ref 150–400)
RBC: 4.66 MIL/uL (ref 3.87–5.11)
RDW: 13 % (ref 11.5–15.5)
WBC: 9.7 10*3/uL (ref 4.0–10.5)
nRBC: 0 % (ref 0.0–0.2)

## 2024-01-16 LAB — COMPREHENSIVE METABOLIC PANEL WITH GFR
ALT: 12 U/L (ref 0–44)
AST: 23 U/L (ref 15–41)
Albumin: 4.5 g/dL (ref 3.5–5.0)
Alkaline Phosphatase: 119 U/L (ref 38–126)
Anion gap: 19 — ABNORMAL HIGH (ref 5–15)
BUN: 13 mg/dL (ref 6–20)
CO2: 25 mmol/L (ref 22–32)
Calcium: 10.4 mg/dL — ABNORMAL HIGH (ref 8.9–10.3)
Chloride: 95 mmol/L — ABNORMAL LOW (ref 98–111)
Creatinine, Ser: 0.89 mg/dL (ref 0.44–1.00)
GFR, Estimated: >60 mL/min (ref >60)
Glucose, Bld: 230 mg/dL — ABNORMAL HIGH (ref 70–99)
Potassium: 3.9 mmol/L (ref 3.5–5.1)
Sodium: 138 mmol/L (ref 135–145)
Total Bilirubin: 0.3 mg/dL (ref 0.0–1.2)
Total Protein: 8.7 g/dL — ABNORMAL HIGH (ref 6.5–8.1)

## 2024-01-16 LAB — CBG MONITORING, ED
Glucose-Capillary: 227 mg/dL — ABNORMAL HIGH (ref 70–99)
Glucose-Capillary: 72 mg/dL (ref 70–99)

## 2024-01-16 LAB — LIPASE, BLOOD: Lipase: 12 U/L (ref 11–51)

## 2024-01-16 MED ORDER — SODIUM CHLORIDE 0.9 % IV BOLUS
500.0000 mL | Freq: Once | INTRAVENOUS | Status: AC
  Administered 2024-01-16: 500 mL via INTRAVENOUS

## 2024-01-16 MED ORDER — DICYCLOMINE HCL 10 MG/ML IM SOLN
20.0000 mg | Freq: Once | INTRAMUSCULAR | Status: AC
  Administered 2024-01-16: 20 mg via INTRAMUSCULAR
  Filled 2024-01-16: qty 2

## 2024-01-16 MED ORDER — HYDROMORPHONE HCL 1 MG/ML IJ SOLN
1.0000 mg | Freq: Once | INTRAMUSCULAR | Status: AC
  Administered 2024-01-16: 1 mg via INTRAVENOUS
  Filled 2024-01-16: qty 1

## 2024-01-16 MED ORDER — DROPERIDOL 2.5 MG/ML IJ SOLN
1.2500 mg | Freq: Once | INTRAMUSCULAR | Status: AC
  Administered 2024-01-16: 1.25 mg via INTRAVENOUS
  Filled 2024-01-16: qty 2

## 2024-01-16 MED ORDER — METOCLOPRAMIDE HCL 5 MG/ML IJ SOLN
5.0000 mg | Freq: Once | INTRAMUSCULAR | Status: AC
  Administered 2024-01-16: 5 mg via INTRAVENOUS
  Filled 2024-01-16: qty 2

## 2024-01-16 NOTE — ED Provider Notes (Signed)
 Care of patient received from prior provider at 11:07 PM, please see their note for complete H/P and care plan.  Received handoff per ED course.  Clinical Course as of 01/16/24 2307  Fri Jan 16, 2024  2306 Stable 38 YOF with cyclic vomitting.  Being treated.   [CC]    Clinical Course User Index [CC] Onetha Bile, MD    Reassessment: Care of patient received from prior provider. Continue to vomit, administered droperidol .  Had complete resolution of syndrome.  Able to tolerate orange juice.  She had hypoglycemic event.   Responded to IV dextrose  and was able to tolerate p.o. intake afterwards. Patient reassessed very frequently during her 7-hour stay in the emergency room.  She had overall resolution of syndrome with only 1 breakthrough episode of emesis during my care. In no acute distress at this time.  Abdominal exam remains benign.  Supportive care to pharmacy, patient stable for outpatient care management. Notably, patient requested IV Dilaudid  during the course of my care, educated on why in the setting of developing gastroparesis and diabetic disease, narcotics are unlikely to provide substantial long-term relief.    Onetha Bile, MD 01/17/24 [phone]

## 2024-01-16 NOTE — ED Notes (Signed)
 Pt having multiple emesis episodes and c/o of severe lower abdominal pain 10/10. MD made aware.

## 2024-01-16 NOTE — ED Notes (Signed)
Pt assisted to [facility]. Unable to provide urine sample at this time

## 2024-01-16 NOTE — ED Provider Notes (Signed)
 [place] EMERGENCY DEPARTMENT AT [facility] Provider Note   CSN: [id number] Arrival date & time: 01/16/24  2018     History  Chief Complaint  Patient presents with   Nausea    [name] is a 38 y.o. female.  HPI   38 year old female presents emergency department with diffuse abdominal pain, nausea and vomiting since noon today.  Patient has had similar symptoms before.  Has required hospital evaluation and IV medicine prior.  She feels like her blood sugars have been uncontrolled as well.  Denies any fever, no genitourinary symptoms, no diarrhea.  Home Medications Prior to Admission medications   Medication Sig Start Date End Date Taking? Authorizing Provider  Acetaminophen  (MIDOL  PO) Take 1 tablet by mouth daily as needed (cramping).    [provider]  amoxicillin -clavulanate (AUGMENTIN ) 875-125 MG tablet Take 1 tablet by mouth 2 (two) times daily. 10/10/23   [provider]  Continuous Blood Gluc Transmit (DEXCOM G6 TRANSMITTER) MISC Inject 1 each into the skin every 14 (fourteen) days. 12/31/21   Versa Gore, NP  Continuous Glucose Sensor (DEXCOM G7 SENSOR) MISC Inject 1 Device into the skin See admin instructions. Place 1 new sensor into the skin every 10 days    [provider]  DULoxetine  (CYMBALTA ) 60 MG capsule Take 60 mg by mouth at bedtime. 06/28/22   [provider]  insulin  aspart (NOVOLOG ) 100 UNIT/ML FlexPen Inject 0-9 Units into the skin 3 (three) times daily with meals. CBG < 70: Eat or drink something sweet and recheck, CBG 70 - 120: 0 units CBG 121 - 150: 1 unit CBG 151 - 200: 2 units CBG 201 - 250: 3 units CBG 251 - 300: 5 units CBG 301 - 350: 7 units CBG 351 - 400: 9 units CBG > 400: call MD. Patient taking differently: Inject 0-9 Units into the skin See admin instructions. Inject 0-9 units into the skin 3 times a day with meals, PER SLIDING SCALE: "CBG < 70: Eat or drink something sweet and  recheck, CBG 70 - 120: 0 units CBG 121 - 150: 1 unit CBG 151 - 200: 2 units CBG 201 - 250: 3 units CBG 251 - 300: 5 units CBG 301 - 350: 7 units CBG 351 - 400: 9 units CBG > 400: call MD" 08/07/23 11/05/23  Casey Clay, MD  insulin  glargine (LANTUS ) 100 UNIT/ML Solostar Pen Inject 12 Units into the skin daily. May substitute as needed per insurance. 10/15/23 01/13/24  [place], [name], DO  Insulin  Pen Needle (B-D UF III MINI PEN NEEDLES) 31G X 5 MM MISC To use with Insulin  pens, as directed 08/07/23   Hongalgi, Anand D, MD  Insulin  Pen Needle (PEN NEEDLES) 31G X 5 MM MISC 1 each by Does not apply route 3 (three) times daily. May dispense any manufacturer covered by patient's insurance. 10/15/23   [place], Rema Care, DO  metoCLOPramide  (REGLAN ) 10 MG tablet Take 1 tablet (10 mg total) by mouth every 6 (six) hours as needed for nausea or vomiting. 12/10/23   Scarlette Currier, MD  oxyCODONE  (OXY IR/ROXICODONE ) 5 MG immediate release tablet Take 5 mg by mouth 4 (four) times daily as needed (for pain).    [provider]  promethazine  (PHENERGAN ) 25 MG tablet Take 1 tablet (25 mg total) by mouth every 6 (six) hours as needed for nausea or vomiting. 12/09/23   Earma Gloss, MD      Allergies    Mirapex [pramipexole]  Review of Systems   Review of Systems  Constitutional:  Negative for fever.  Respiratory:  Negative for shortness of breath.   Cardiovascular:  Negative for chest pain.  Gastrointestinal:  Positive for abdominal pain, nausea and vomiting. Negative for diarrhea.  Genitourinary:  Negative for dysuria, hematuria, vaginal bleeding and vaginal discharge.  Skin:  Negative for rash.  Neurological:  Negative for headaches.    Physical Exam Updated Vital Signs BP 137/77   Pulse (!) 105   Temp 98.2 F (36.8 C) (Oral)   Resp (!) 23   Ht 5\' 6"  (1.676 m)   Wt 74.4 kg   LMP 01/13/2024   SpO2 100%   BMI 26.47 kg/m  Physical Exam Vitals and nursing note reviewed.   Constitutional:      Appearance: Normal appearance.     Comments: Draped over at the side of the bed and railing, moaning  HENT:     Head: Normocephalic.     Mouth/Throat:     Mouth: Mucous membranes are moist.  Cardiovascular:     Rate and Rhythm: Normal rate.  Pulmonary:     Effort: Pulmonary effort is normal. No respiratory distress.  Abdominal:     General: Bowel sounds are normal. There is no distension.     Palpations: Abdomen is soft.     Tenderness: There is no abdominal tenderness. There is no guarding.  Skin:    General: Skin is warm.  Neurological:     Mental Status: She is alert and oriented to person, place, and time. Mental status is at baseline.  Psychiatric:        Mood and Affect: Mood normal.     ED Results / Procedures / Treatments   Labs (all labs ordered are listed, but only abnormal results are displayed) Labs Reviewed  COMPREHENSIVE METABOLIC PANEL WITH GFR - Abnormal; Notable for the following components:      Result Value   Chloride 95 (*)    Glucose, Bld 230 (*)    Calcium  10.4 (*)    Total Protein 8.7 (*)    Anion gap 19 (*)    All other components within normal limits  CBG MONITORING, ED - Abnormal; Notable for the following components:   Glucose-Capillary 227 (*)    All other components within normal limits  LIPASE, BLOOD  CBC  URINALYSIS, ROUTINE W REFLEX MICROSCOPIC  PREGNANCY, URINE  CBG MONITORING, ED    EKG EKG Interpretation Date/Time:  Friday Jan 16 2024 21:23:01 EDT Ventricular Rate:  111 PR Interval:  136 QRS Duration:  73 QT Interval:  337 QTC Calculation: 458 R Axis:   80  Text Interpretation: Sinus tachycardia Borderline repolarization abnormality Confirmed by Onetha Bile [id number]) on 01/16/2024 11:07:07 PM  Radiology No results found.  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  Being treated.   [CC]    Clinical Course User Index [CC] Onetha Bile, MD  Medical Decision Making Amount and/or Complexity of Data Reviewed Labs: ordered.  Risk Prescription drug management.   38 year old female presents emergency department with diffuse abdominal pain, nausea/vomiting, concern for uncontrolled blood sugars.  Patient has had similar symptoms before, has been seen in the ER for similar symptoms.  Last evaluation and CT scan was just about a month ago.  Initial blood work is reassuring.  No findings of DKA/HHS.  Abdomen is soft, no distention.  Symptomatic medication ordered.  I am reassured with her initial blood work and abdominal exam.  Patient signed out pending reevaluation after medications.        Final Clinical Impression(s) / ED Diagnoses Final diagnoses:  None    Rx / DC Orders ED Discharge Orders     None         Flonnie Humphrey, DO 01/16/24 2320

## 2024-01-16 NOTE — ED Notes (Addendum)
 Pt reports feeling weak and fatigue. Also reports feeling like her blood sugar is low. Hx of Type 1 DM. CBG rechecked. MD notified.

## 2024-01-16 NOTE — ED Notes (Signed)
 ED Provider at bedside.

## 2024-01-16 NOTE — ED Triage Notes (Signed)
 Pt is diabetic. Today she has been nauseated and has been vomiting since 12 noon. Having abdominal cramping and said that her blood sugar has been low.

## 2024-01-17 ENCOUNTER — Encounter (HOSPITAL_COMMUNITY): Payer: Self-pay

## 2024-01-17 ENCOUNTER — Emergency Department (HOSPITAL_COMMUNITY)
Admission: EM | Admit: 2024-01-17 | Discharge: 2024-01-17 | Disposition: A | Discharge status: Home / Self Care | Source: Home / Self Care | Attending: Emergency Medicine | Admitting: Emergency Medicine

## 2024-01-17 ENCOUNTER — Other Ambulatory Visit: Payer: Self-pay

## 2024-01-17 DIAGNOSIS — F1290 Cannabis use, unspecified, uncomplicated: Secondary | ICD-10-CM

## 2024-01-17 DIAGNOSIS — E1065 Type 1 diabetes mellitus with hyperglycemia: Secondary | ICD-10-CM | POA: Insufficient documentation

## 2024-01-17 DIAGNOSIS — E876 Hypokalemia: Secondary | ICD-10-CM | POA: Insufficient documentation

## 2024-01-17 DIAGNOSIS — Z794 Long term (current) use of insulin: Secondary | ICD-10-CM | POA: Insufficient documentation

## 2024-01-17 DIAGNOSIS — Z79899 Other long term (current) drug therapy: Secondary | ICD-10-CM | POA: Insufficient documentation

## 2024-01-17 DIAGNOSIS — E860 Dehydration: Secondary | ICD-10-CM | POA: Insufficient documentation

## 2024-01-17 DIAGNOSIS — R030 Elevated blood-pressure reading, without diagnosis of hypertension: Secondary | ICD-10-CM

## 2024-01-17 DIAGNOSIS — R112 Nausea with vomiting, unspecified: Secondary | ICD-10-CM

## 2024-01-17 LAB — CBC
HCT: 38.3 % (ref 36.0–46.0)
Hemoglobin: 12.9 g/dL (ref 12.0–15.0)
MCH: 30.4 pg (ref 26.0–34.0)
MCHC: 33.7 g/dL (ref 30.0–36.0)
MCV: 90.1 fL (ref 80.0–100.0)
Platelets: 347 10*3/uL (ref 150–400)
RBC: 4.25 MIL/uL (ref 3.87–5.11)
RDW: 13.1 % (ref 11.5–15.5)
WBC: 12.6 10*3/uL — ABNORMAL HIGH (ref 4.0–10.5)
nRBC: 0 % (ref 0.0–0.2)

## 2024-01-17 LAB — URINALYSIS, ROUTINE W REFLEX MICROSCOPIC
Bilirubin Urine: NEGATIVE
Glucose, UA: 500 mg/dL — AB
Ketones, ur: 15 mg/dL — AB
Nitrite: NEGATIVE
Protein, ur: 100 mg/dL — AB
Specific Gravity, Urine: 1.015 (ref 1.005–1.030)
pH: 7.5 (ref 5.0–8.0)

## 2024-01-17 LAB — COMPREHENSIVE METABOLIC PANEL WITH GFR
ALT: 14 U/L (ref 0–44)
AST: 32 U/L (ref 15–41)
Albumin: 4 g/dL (ref 3.5–5.0)
Alkaline Phosphatase: 92 U/L (ref 38–126)
Anion gap: 15 (ref 5–15)
BUN: 10 mg/dL (ref 6–20)
CO2: 29 mmol/L (ref 22–32)
Calcium: 9.4 mg/dL (ref 8.9–10.3)
Chloride: 93 mmol/L — ABNORMAL LOW (ref 98–111)
Creatinine, Ser: 0.71 mg/dL (ref 0.44–1.00)
GFR, Estimated: >60 mL/min (ref >60)
Glucose, Bld: 231 mg/dL — ABNORMAL HIGH (ref 70–99)
Potassium: 3.2 mmol/L — ABNORMAL LOW (ref 3.5–5.1)
Sodium: 137 mmol/L (ref 135–145)
Total Bilirubin: 0.5 mg/dL (ref 0.0–1.2)
Total Protein: 8.3 g/dL — ABNORMAL HIGH (ref 6.5–8.1)

## 2024-01-17 LAB — CBG MONITORING, ED
Glucose-Capillary: 33 mg/dL — CL (ref 70–99)
Glucose-Capillary: 39 mg/dL — CL (ref 70–99)
Glucose-Capillary: 290 mg/dL — ABNORMAL HIGH (ref 70–99)
Glucose-Capillary: 196 mg/dL — ABNORMAL HIGH (ref 70–99)
Glucose-Capillary: 32 mg/dL — CL (ref 70–99)
Glucose-Capillary: 138 mg/dL — ABNORMAL HIGH (ref 70–99)
Glucose-Capillary: 31 mg/dL — CL (ref 70–99)
Glucose-Capillary: 151 mg/dL — ABNORMAL HIGH (ref 70–99)

## 2024-01-17 LAB — RAPID URINE DRUG SCREEN, HOSP PERFORMED
Amphetamines: NOT DETECTED
Barbiturates: NOT DETECTED
Benzodiazepines: NOT DETECTED
Cocaine: NOT DETECTED
Opiates: NOT DETECTED
Tetrahydrocannabinol: POSITIVE — AB

## 2024-01-17 LAB — PREGNANCY, URINE: Preg Test, Ur: NEGATIVE

## 2024-01-17 LAB — BETA-HYDROXYBUTYRIC ACID: Beta-Hydroxybutyric Acid: 0.4 mmol/L — ABNORMAL HIGH (ref 0.05–0.27)

## 2024-01-17 LAB — LIPASE, BLOOD: Lipase: 21 U/L (ref 11–51)

## 2024-01-17 MED ORDER — LACTATED RINGERS IV BOLUS
1000.0000 mL | Freq: Once | INTRAVENOUS | Status: AC
  Administered 2024-01-17: 1000 mL via INTRAVENOUS

## 2024-01-17 MED ORDER — MORPHINE SULFATE (PF) 2 MG/ML IV SOLN
2.0000 mg | Freq: Once | INTRAVENOUS | Status: AC
  Administered 2024-01-17: 2 mg via INTRAVENOUS
  Filled 2024-01-17: qty 1

## 2024-01-17 MED ORDER — ONDANSETRON HCL 4 MG/2ML IJ SOLN
4.0000 mg | Freq: Once | INTRAMUSCULAR | Status: AC
  Administered 2024-01-17: 4 mg via INTRAVENOUS
  Filled 2024-01-17: qty 2

## 2024-01-17 MED ORDER — MORPHINE SULFATE (PF) 4 MG/ML IV SOLN
4.0000 mg | Freq: Once | INTRAVENOUS | Status: AC
  Administered 2024-01-17: 4 mg via INTRAVENOUS
  Filled 2024-01-17: qty 1

## 2024-01-17 MED ORDER — POTASSIUM CHLORIDE 20 MEQ PO PACK
40.0000 meq | PACK | Freq: Two times a day (BID) | ORAL | Status: DC
  Filled 2024-01-17: qty 2

## 2024-01-17 MED ORDER — METOCLOPRAMIDE HCL 10 MG PO TABS
10.0000 mg | ORAL_TABLET | Freq: Four times a day (QID) | ORAL | 0 refills | Status: DC | PRN
Start: 2024-01-17 — End: 2024-04-26

## 2024-01-17 MED ORDER — PROMETHAZINE HCL 25 MG RE SUPP: 25.0000 mg | Freq: Four times a day (QID) | RECTAL | 0 refills | Status: DC | PRN

## 2024-01-17 MED ORDER — PANTOPRAZOLE SODIUM 40 MG IV SOLR
40.0000 mg | Freq: Once | INTRAVENOUS | Status: AC
  Administered 2024-01-17: 40 mg via INTRAVENOUS
  Filled 2024-01-17: qty 10

## 2024-01-17 MED ORDER — DEXTROSE 50 % IV SOLN
25.0000 g | Freq: Once | INTRAVENOUS | Status: AC
  Administered 2024-01-17: 25 g via INTRAVENOUS
  Filled 2024-01-17: qty 50

## 2024-01-17 MED ORDER — HYDROMORPHONE HCL 1 MG/ML IJ SOLN
0.5000 mg | Freq: Once | INTRAMUSCULAR | Status: AC
  Administered 2024-01-17: 0.5 mg via INTRAVENOUS
  Filled 2024-01-17: qty 1

## 2024-01-17 MED ORDER — METOCLOPRAMIDE HCL 5 MG/ML IJ SOLN
10.0000 mg | Freq: Once | INTRAMUSCULAR | Status: AC
  Administered 2024-01-17: 10 mg via INTRAVENOUS
  Filled 2024-01-17: qty 2

## 2024-01-17 NOTE — ED Triage Notes (Signed)
 [facility] reports pt coming from home for hyperglycemia, pt been having n/v/d for the past 3 days. Was seen at [facility] last night and got meds but not able to hold it down.

## 2024-01-17 NOTE — ED Notes (Signed)
 Pt given orange juice and graham crackers and peanut butter, pt conscious and alert, able to eat

## 2024-01-17 NOTE — ED Notes (Signed)
 Took patient care, complaining of vomiting and abdominal pain. Gave zofran  and a cup of sprite.

## 2024-01-17 NOTE — ED Provider Notes (Signed)
 [place] EMERGENCY DEPARTMENT AT [facility] Provider Note   CSN: [id number] Arrival date & time: 01/17/24  1357     History  Chief Complaint  Patient presents with   Hyperglycemia    [name] is a 38 y.o. female.  Pt with hx iddm, c/o nausea, vomiting, and epigastric pain - notes similar recurrent symptoms on chronic, recurrent basis in past months. Notes hx gastroparesis. Denies hx pancreatitis. Hx small gallstones/gb sludge on prior imaging. Denies fever or chills. Indicates compliant w home meds - indicates took her normal insulin  just prior to ems arrival. Emesis is color of recently ingested fluids or yellowish, not bloody or bilious. No abd distension. Having normal bms. No fever or chills. No dysuria or gu c/o. No lower abd or pelvic pain. No back/flank pain.   The history is provided by the patient, medical records and the EMS personnel.  Hyperglycemia Associated symptoms: abdominal pain, nausea and vomiting   Associated symptoms: no chest pain, no dysuria, no fever and no shortness of breath        Home Medications Prior to Admission medications   Medication Sig Start Date End Date Taking? Authorizing Provider  Acetaminophen  (MIDOL  PO) Take 1 tablet by mouth daily as needed (cramping).    [provider]  amoxicillin -clavulanate (AUGMENTIN ) 875-125 MG tablet Take 1 tablet by mouth 2 (two) times daily. 10/10/23   [provider]  Continuous Blood Gluc Transmit (DEXCOM G6 TRANSMITTER) MISC Inject 1 each into the skin every 14 (fourteen) days. 12/31/21   Versa Gore, NP  Continuous Glucose Sensor (DEXCOM G7 SENSOR) MISC Inject 1 Device into the skin See admin instructions. Place 1 new sensor into the skin every 10 days    [provider]  DULoxetine  (CYMBALTA ) 60 MG capsule Take 60 mg by mouth at bedtime. 06/28/22   [provider]  insulin  aspart (NOVOLOG ) 100 UNIT/ML FlexPen Inject 0-9 Units into the  skin 3 (three) times daily with meals. CBG < 70: Eat or drink something sweet and recheck, CBG 70 - 120: 0 units CBG 121 - 150: 1 unit CBG 151 - 200: 2 units CBG 201 - 250: 3 units CBG 251 - 300: 5 units CBG 301 - 350: 7 units CBG 351 - 400: 9 units CBG > 400: call MD. Patient taking differently: Inject 0-9 Units into the skin See admin instructions. Inject 0-9 units into the skin 3 times a day with meals, PER SLIDING SCALE: "CBG < 70: Eat or drink something sweet and recheck, CBG 70 - 120: 0 units CBG 121 - 150: 1 unit CBG 151 - 200: 2 units CBG 201 - 250: 3 units CBG 251 - 300: 5 units CBG 301 - 350: 7 units CBG 351 - 400: 9 units CBG > 400: call MD" 08/07/23 11/05/23  Casey Clay, MD  insulin  glargine (LANTUS ) 100 UNIT/ML Solostar Pen Inject 12 Units into the skin daily. May substitute as needed per insurance. 10/15/23 01/13/24  [place], [name], DO  Insulin  Pen Needle (B-D UF III MINI PEN NEEDLES) 31G X 5 MM MISC To use with Insulin  pens, as directed 08/07/23   Hongalgi, Anand D, MD  Insulin  Pen Needle (PEN NEEDLES) 31G X 5 MM MISC 1 each by Does not apply route 3 (three) times daily. May dispense any manufacturer covered by patient's insurance. 10/15/23   [place], Rema Care, DO  metoCLOPramide  (REGLAN ) 10 MG tablet Take 1 tablet (10 mg total) by mouth every 6 (  six) hours as needed for nausea or vomiting. 01/17/24   Onetha Bile, MD  oxyCODONE  (OXY IR/ROXICODONE ) 5 MG immediate release tablet Take 5 mg by mouth 4 (four) times daily as needed (for pain).    [provider]  promethazine  (PHENERGAN ) 25 MG suppository Place 1 suppository (25 mg total) rectally every 6 (six) hours as needed for nausea or vomiting. 01/17/24   Onetha Bile, MD      Allergies    Mirapex [pramipexole]    Review of Systems   Review of Systems  Constitutional:  Negative for chills and fever.  HENT:  Negative for sore throat.   Respiratory:  Negative for cough and shortness of breath.   Cardiovascular:   Negative for chest pain.  Gastrointestinal:  Positive for abdominal pain, nausea and vomiting. Negative for constipation and diarrhea.  Genitourinary:  Negative for dysuria, flank pain, pelvic pain, vaginal bleeding and vaginal discharge.  Musculoskeletal:  Negative for back pain and neck pain.  Skin:  Negative for rash.  Neurological:  Negative for headaches.    Physical Exam Updated Vital Signs BP (!) 153/90   Pulse (!) 110   Temp 97.9 F (36.6 C) (Oral)   Resp 18   LMP 01/13/2024   SpO2 100%  Physical Exam Vitals and nursing note reviewed.  Constitutional:      Appearance: Normal appearance. She is well-developed.  HENT:     Head: Atraumatic.     Nose: Nose normal.     Mouth/Throat:     Mouth: Mucous membranes are moist.     Pharynx: Oropharynx is clear.  Eyes:     General: No scleral icterus.    Conjunctiva/sclera: Conjunctivae normal.     Pupils: Pupils are equal, round, and reactive to light.  Neck:     Trachea: No tracheal deviation.     Comments: No stiffness or rigidity.  Cardiovascular:     Rate and Rhythm: Regular rhythm. Tachycardia present.     Pulses: Normal pulses.     Heart sounds: Normal heart sounds. No murmur heard.    No friction rub. No gallop.  Pulmonary:     Effort: Pulmonary effort is normal. No respiratory distress.     Breath sounds: Normal breath sounds.  Abdominal:     General: Bowel sounds are normal. There is no distension.     Palpations: Abdomen is soft.     Tenderness: There is abdominal tenderness. There is no guarding.     Comments: Mild epigastric tenderness.   Genitourinary:    Comments: No cva tenderness.  Musculoskeletal:        General: No swelling or tenderness.     Cervical back: Normal range of motion and neck supple. No rigidity. No muscular tenderness.  Skin:    General: Skin is warm and dry.     Findings: No rash.  Neurological:     Mental Status: She is alert.     Comments: Alert, speech normal. Motor/sens grossly  intact bil.   Psychiatric:        Mood and Affect: Mood normal.     ED Results / Procedures / Treatments   Labs (all labs ordered are listed, but only abnormal results are displayed) Results for orders placed or performed during the hospital encounter of 01/17/24  CBC   Collection Time: 01/17/24  2:30 PM  Result Value Ref Range   WBC 12.6 (H) 4.0 - 10.5 K/uL   RBC 4.25 3.87 - 5.11 MIL/uL   Hemoglobin 12.9  12.0 - 15.0 g/dL   HCT 62.9 52.8 - 41.3 %   MCV 90.1 80.0 - 100.0 fL   MCH 30.4 26.0 - 34.0 pg   MCHC 33.7 30.0 - 36.0 g/dL   RDW 24.4 01.0 - 27.2 %   Platelets 347 150 - 400 K/uL   nRBC 0.0 0.0 - 0.2 %  Comprehensive metabolic panel with GFR   Collection Time: 01/17/24  2:30 PM  Result Value Ref Range   Sodium 137 135 - 145 mmol/L   Potassium 3.2 (L) 3.5 - 5.1 mmol/L   Chloride 93 (L) 98 - 111 mmol/L   CO2 29 22 - 32 mmol/L   Glucose, Bld 231 (H) 70 - 99 mg/dL   BUN 10 6 - 20 mg/dL   Creatinine, Ser 5.36 0.44 - 1.00 mg/dL   Calcium  9.4 8.9 - 10.3 mg/dL   Total Protein 8.3 (H) 6.5 - 8.1 g/dL   Albumin 4.0 3.5 - 5.0 g/dL   AST 32 15 - 41 U/L   ALT 14 0 - 44 U/L   Alkaline Phosphatase 92 38 - 126 U/L   Total Bilirubin 0.5 0.0 - 1.2 mg/dL   GFR, Estimated >64 >40 mL/min   Anion gap 15 5 - 15  Beta-hydroxybutyric acid   Collection Time: 01/17/24  2:30 PM  Result Value Ref Range   Beta-Hydroxybutyric Acid 0.40 (H) 0.05 - 0.27 mmol/L  Lipase, blood   Collection Time: 01/17/24  2:30 PM  Result Value Ref Range   Lipase 21 11 - 51 U/L  Rapid urine drug screen (hospital performed)   Collection Time: 01/17/24  2:45 PM  Result Value Ref Range   Opiates NONE DETECTED NONE DETECTED   Cocaine NONE DETECTED NONE DETECTED   Benzodiazepines NONE DETECTED NONE DETECTED   Amphetamines NONE DETECTED NONE DETECTED   Tetrahydrocannabinol POSITIVE (A) NONE DETECTED   Barbiturates NONE DETECTED NONE DETECTED  CBG monitoring, ED   Collection Time: 01/17/24  5:29 PM  Result Value  Ref Range   Glucose-Capillary 32 (LL) 70 - 99 mg/dL   Comment 1 Notify RN   POC CBG, ED   Collection Time: 01/17/24  5:48 PM  Result Value Ref Range   Glucose-Capillary 31 (LL) 70 - 99 mg/dL  CBG monitoring, ED   Collection Time: 01/17/24  6:05 PM  Result Value Ref Range   Glucose-Capillary 138 (H) 70 - 99 mg/dL  POC CBG, ED   Collection Time: 01/17/24  7:02 PM  Result Value Ref Range   Glucose-Capillary 151 (H) 70 - 99 mg/dL     EKG None  Radiology No results found.  Procedures Procedures    Medications Ordered in ED Medications  potassium chloride  (KLOR-CON ) packet 40 mEq (40 mEq Oral Patient Refused/Not Given 01/17/24 1539)  lactated ringers  bolus 1,000 mL (0 mLs Intravenous Stopped 01/17/24 1541)  ondansetron  (ZOFRAN ) injection 4 mg (4 mg Intravenous Given 01/17/24 1438)  pantoprazole  (PROTONIX ) injection 40 mg (40 mg Intravenous Given 01/17/24 1437)  HYDROmorphone  (DILAUDID ) injection 0.5 mg (0.5 mg Intravenous Given 01/17/24 1438)  lactated ringers  bolus 1,000 mL (0 mLs Intravenous Stopped 01/17/24 1734)  morphine  (PF) 4 MG/ML injection 4 mg (4 mg Intravenous Given 01/17/24 1610)  metoCLOPramide  (REGLAN ) injection 10 mg (10 mg Intravenous Given 01/17/24 1610)  morphine  (PF) 2 MG/ML injection 2 mg (2 mg Intravenous Given 01/17/24 1728)  dextrose  50 % solution 25 g (25 g Intravenous Given 01/17/24 1749)    ED Course/ Medical Decision Making/ A&P  Medical Decision Making Problems Addressed: Dehydration: acute illness or injury with systemic symptoms that poses a threat to life or bodily functions Hypokalemia: acute illness or injury Marijuana use: acute illness or injury with systemic symptoms Nausea and vomiting in adult: acute illness or injury with systemic symptoms that poses a threat to life or bodily functions Type 1 diabetes mellitus with hyperglycemia (HCC): chronic illness or injury with exacerbation, progression, or side effects of  treatment that poses a threat to life or bodily functions  Amount and/or Complexity of Data Reviewed Independent Historian: EMS    Details: hx External Data Reviewed: notes. Labs: ordered. Decision-making details documented in ED Course. Radiology: independent interpretation performed. Decision-making details documented in ED Course.  Risk Prescription drug management. Parenteral controlled substances. Decision regarding hospitalization.   Iv ns. Continuous pulse ox and cardiac monitoring. Labs ordered/sent. Recent  prior imaging reviewed.   Differential diagnosis includes nvd, gastroenteritis, dehydration, dka, GP, CHS, gastritis, biliary colic, pancreatitis, etc. Dispo decision including potential need for admission considered - will get labs and review recent imaging and reassess.   Reviewed nursing notes and prior charts for additional history. External reports reviewed. Additional history from: EMS.   LR bolus. Protonix  iv. Zofran  iv. Dilaudid  iv.   Cardiac monitor: sinus rhythm, rate 110.  Labs reviewed/interpreted by me - had labs this AM, ua neg for uti, u preg neg. Current labs also include chem w k sl low, kcl po, hco3 is normal, glucose mild/mod elevated. THC+ - ?possible contribution Cannabinoid Hyperemesis Syndrome as cause of patients recurrent symptoms (upper abd pain and nv).  No current dka.   Additional iv and po fluids. No recurrent emesis. Abd soft non tender.   Prior CT reviewed/interpreted by me - no sbo - as no recurrent emesis in ED< and abd soft non tender, feel no benefit in repeating today.   Repeat cbg low (pt had taken her insulin  prior to ems arrival, and received ivf in ED). D50, po fluids/meal. Pt fully awake and alert. Recheck abd soft non tender. No recurrent emesis.   Pt tolerated po fluids/food well.  Abd soft nt. No emesis. Repeat cbg x 2, normal.   Pt appears stable for ED d/c.   Rec close pcp f/u.  Return precautions provided.           Final Clinical Impression(s) / ED Diagnoses Final diagnoses:  Nausea and vomiting in adult  Dehydration  Hypokalemia  Insulin  dependent type 2 diabetes mellitus (HCC)  Marijuana use    Rx / DC Orders ED Discharge Orders     None         Guadalupe Lee, MD 01/17/24 1909

## 2024-01-17 NOTE — ED Notes (Signed)
 Pt sugar dropped was given orange juice and peanut butter graham crackers

## 2024-01-17 NOTE — Discharge Instructions (Addendum)
 It was our pleasure to provide your ER care today - we hope that you feel better.  Drink plenty of fluids/stay well hydrated. Take your diabetes meds as prescribed. Take the reglan  as need, as prescribed, for nausea. Take acetaminophen  as need.   Your blood pressure is mildly high today - eat heart healthy meal plan/limit salt intake, and follow up closely with primary care doctor in 1-2 weeks.  Your potassium level is low - eat plenty of fruits and vegetables, and follow up with primary care doctor in 1-2 weeks.   For gallstones noted on prior imaging, follow up with general surgeon in the next few weeks - call office to arrange appointment.   Note that increasingly we are seeing a recurrent abdominal pain and/or vomiting syndrome called Cannabinoid Hyperemesis Syndrome - see attached info - in these cases, avoiding marijuana use will prevent symptoms from recurring (note that symptoms can persist for a few weeks if history of  regular and/or heavy marijuana use as it can take time to get out of system).   Return to ER right away  if worse, fevers, new symptoms, new or worsening or severe abdominal pain, persistent vomiting, chest pain, trouble breathing, or other emergency concern.  You were given pain meds in the ER - no driving for the next 6 hours.

## 2024-01-17 NOTE — ED Notes (Signed)
 Countryman, MD made aware of critical BG. Countryman, MD to bedside. Patient A&O X4. Drinking juice. D10 bolus started per MD.

## 2024-01-18 ENCOUNTER — Observation Stay (HOSPITAL_BASED_OUTPATIENT_CLINIC_OR_DEPARTMENT_OTHER)
Admission: EM | Admit: 2024-01-18 | Discharge: 2024-01-19 | Disposition: A | Discharge status: Home / Self Care | Attending: Internal Medicine | Admitting: Internal Medicine

## 2024-01-18 ENCOUNTER — Other Ambulatory Visit: Payer: Self-pay

## 2024-01-18 ENCOUNTER — Encounter (HOSPITAL_BASED_OUTPATIENT_CLINIC_OR_DEPARTMENT_OTHER): Payer: Self-pay

## 2024-01-18 DIAGNOSIS — E1010 Type 1 diabetes mellitus with ketoacidosis without coma: Secondary | ICD-10-CM | POA: Insufficient documentation

## 2024-01-18 DIAGNOSIS — G8929 Other chronic pain: Secondary | ICD-10-CM | POA: Insufficient documentation

## 2024-01-18 DIAGNOSIS — R112 Nausea with vomiting, unspecified: Secondary | ICD-10-CM | POA: Diagnosis present

## 2024-01-18 DIAGNOSIS — E871 Hypo-osmolality and hyponatremia: Secondary | ICD-10-CM | POA: Insufficient documentation

## 2024-01-18 DIAGNOSIS — R1084 Generalized abdominal pain: Secondary | ICD-10-CM | POA: Diagnosis not present

## 2024-01-18 DIAGNOSIS — K2100 Gastro-esophageal reflux disease with esophagitis, without bleeding: Secondary | ICD-10-CM | POA: Diagnosis not present

## 2024-01-18 DIAGNOSIS — Z794 Long term (current) use of insulin: Secondary | ICD-10-CM | POA: Insufficient documentation

## 2024-01-18 DIAGNOSIS — K8080 Other cholelithiasis without obstruction: Secondary | ICD-10-CM | POA: Diagnosis not present

## 2024-01-18 DIAGNOSIS — Z743 Need for continuous supervision: Secondary | ICD-10-CM | POA: Diagnosis not present

## 2024-01-18 DIAGNOSIS — E1065 Type 1 diabetes mellitus with hyperglycemia: Secondary | ICD-10-CM | POA: Diagnosis not present

## 2024-01-18 DIAGNOSIS — K3184 Gastroparesis: Principal | ICD-10-CM | POA: Diagnosis present

## 2024-01-18 DIAGNOSIS — E1043 Type 1 diabetes mellitus with diabetic autonomic (poly)neuropathy: Secondary | ICD-10-CM | POA: Insufficient documentation

## 2024-01-18 DIAGNOSIS — R10817 Generalized abdominal tenderness: Secondary | ICD-10-CM | POA: Diagnosis not present

## 2024-01-18 DIAGNOSIS — R7989 Other specified abnormal findings of blood chemistry: Secondary | ICD-10-CM | POA: Diagnosis present

## 2024-01-18 DIAGNOSIS — F1290 Cannabis use, unspecified, uncomplicated: Secondary | ICD-10-CM | POA: Diagnosis present

## 2024-01-18 DIAGNOSIS — F1299 Cannabis use, unspecified with unspecified cannabis-induced disorder: Secondary | ICD-10-CM | POA: Insufficient documentation

## 2024-01-18 DIAGNOSIS — K8020 Calculus of gallbladder without cholecystitis without obstruction: Secondary | ICD-10-CM | POA: Diagnosis present

## 2024-01-18 DIAGNOSIS — I10 Essential (primary) hypertension: Secondary | ICD-10-CM | POA: Diagnosis not present

## 2024-01-18 LAB — CBG MONITORING, ED
Glucose-Capillary: 252 mg/dL — ABNORMAL HIGH (ref 70–99)
Glucose-Capillary: 268 mg/dL — ABNORMAL HIGH (ref 70–99)
Glucose-Capillary: 222 mg/dL — ABNORMAL HIGH (ref 70–99)

## 2024-01-18 LAB — CBC
HCT: 36 % (ref 36.0–46.0)
Hemoglobin: 12.2 g/dL (ref 12.0–15.0)
MCH: 30.3 pg (ref 26.0–34.0)
MCHC: 33.9 g/dL (ref 30.0–36.0)
MCV: 89.6 fL (ref 80.0–100.0)
Platelets: 312 10*3/uL (ref 150–400)
RBC: 4.02 MIL/uL (ref 3.87–5.11)
RDW: 13.3 % (ref 11.5–15.5)
WBC: 10.6 10*3/uL — ABNORMAL HIGH (ref 4.0–10.5)
nRBC: 0 % (ref 0.0–0.2)

## 2024-01-18 LAB — GLUCOSE, CAPILLARY
Glucose-Capillary: 227 mg/dL — ABNORMAL HIGH (ref 70–99)
Glucose-Capillary: 245 mg/dL — ABNORMAL HIGH (ref 70–99)
Glucose-Capillary: 164 mg/dL — ABNORMAL HIGH (ref 70–99)

## 2024-01-18 LAB — URINE DRUG SCREEN
Amphetamines: NOT DETECTED
Barbiturates: NOT DETECTED
Benzodiazepines: NOT DETECTED
Cocaine: NOT DETECTED
Fentanyl: DETECTED — AB
Methadone Scn, Ur: NOT DETECTED
Opiates: DETECTED — AB
Tetrahydrocannabinol: DETECTED — AB

## 2024-01-18 LAB — COMPREHENSIVE METABOLIC PANEL WITH GFR
ALT: 10 U/L (ref 0–44)
AST: 23 U/L (ref 15–41)
Albumin: 4.1 g/dL (ref 3.5–5.0)
Alkaline Phosphatase: 99 U/L (ref 38–126)
Anion gap: 15 (ref 5–15)
BUN: 7 mg/dL (ref 6–20)
CO2: 25 mmol/L (ref 22–32)
Calcium: 9.9 mg/dL (ref 8.9–10.3)
Chloride: 92 mmol/L — ABNORMAL LOW (ref 98–111)
Creatinine, Ser: 0.85 mg/dL (ref 0.44–1.00)
GFR, Estimated: >60 mL/min (ref >60)
Glucose, Bld: 400 mg/dL — ABNORMAL HIGH (ref 70–99)
Potassium: 3.7 mmol/L (ref 3.5–5.1)
Sodium: 133 mmol/L — ABNORMAL LOW (ref 135–145)
Total Bilirubin: 0.3 mg/dL (ref 0.0–1.2)
Total Protein: 7.8 g/dL (ref 6.5–8.1)

## 2024-01-18 LAB — URINALYSIS, ROUTINE W REFLEX MICROSCOPIC
Bacteria, UA: NONE SEEN
Bilirubin Urine: NEGATIVE
Glucose, UA: >1000 mg/dL — AB
Hgb urine dipstick: NEGATIVE
Ketones, ur: 40 mg/dL — AB
Leukocytes,Ua: NEGATIVE
Nitrite: NEGATIVE
Protein, ur: 30 mg/dL — AB
Specific Gravity, Urine: 1.02 (ref 1.005–1.030)
pH: 6.5 (ref 5.0–8.0)

## 2024-01-18 LAB — LIPASE, BLOOD: Lipase: 20 U/L (ref 11–51)

## 2024-01-18 LAB — PREGNANCY, URINE: Preg Test, Ur: NEGATIVE

## 2024-01-18 MED ORDER — INSULIN ASPART 100 UNIT/ML IJ SOLN
0.0000 [IU] | Freq: Four times a day (QID) | INTRAMUSCULAR | Status: DC
  Administered 2024-01-18: 3 [IU] via SUBCUTANEOUS

## 2024-01-18 MED ORDER — LACTATED RINGERS IV BOLUS
1000.0000 mL | Freq: Once | INTRAVENOUS | Status: AC
  Administered 2024-01-18: 1000 mL via INTRAVENOUS

## 2024-01-18 MED ORDER — METOPROLOL TARTRATE 5 MG/5ML IV SOLN
5.0000 mg | Freq: Three times a day (TID) | INTRAVENOUS | Status: DC
  Administered 2024-01-18 – 2024-01-19 (×3): 5 mg via INTRAVENOUS
  Filled 2024-01-18 (×3): qty 5

## 2024-01-18 MED ORDER — DROPERIDOL 2.5 MG/ML IJ SOLN
1.2500 mg | Freq: Once | INTRAMUSCULAR | Status: AC
  Administered 2024-01-18: 1.25 mg via INTRAVENOUS
  Filled 2024-01-18: qty 2

## 2024-01-18 MED ORDER — ONDANSETRON HCL 4 MG/2ML IJ SOLN: 4.0000 mg | Freq: Four times a day (QID) | INTRAMUSCULAR | Status: DC | PRN

## 2024-01-18 MED ORDER — SODIUM CHLORIDE 0.9 % IV BOLUS
1000.0000 mL | Freq: Once | INTRAVENOUS | Status: AC
  Administered 2024-01-18: 1000 mL via INTRAVENOUS

## 2024-01-18 MED ORDER — ONDANSETRON HCL 4 MG PO TABS: 4.0000 mg | ORAL_TABLET | Freq: Four times a day (QID) | ORAL | Status: DC | PRN

## 2024-01-18 MED ORDER — HYDROMORPHONE HCL 1 MG/ML IJ SOLN
0.5000 mg | INTRAMUSCULAR | Status: DC | PRN
  Administered 2024-01-18 – 2024-01-19 (×5): 0.5 mg via INTRAVENOUS
  Filled 2024-01-18 (×5): qty 0.5

## 2024-01-18 MED ORDER — HYDROMORPHONE HCL 1 MG/ML IJ SOLN
1.0000 mg | Freq: Once | INTRAMUSCULAR | Status: AC
  Administered 2024-01-18: 1 mg via INTRAVENOUS
  Filled 2024-01-18 (×2): qty 1

## 2024-01-18 MED ORDER — ACETAMINOPHEN 650 MG RE SUPP: 650.0000 mg | Freq: Four times a day (QID) | RECTAL | Status: DC | PRN

## 2024-01-18 MED ORDER — HYDROMORPHONE HCL 1 MG/ML IJ SOLN
1.0000 mg | Freq: Once | INTRAMUSCULAR | Status: AC
  Administered 2024-01-18: 1 mg via INTRAVENOUS
  Filled 2024-01-18: qty 1

## 2024-01-18 MED ORDER — ACETAMINOPHEN 325 MG PO TABS
650.0000 mg | ORAL_TABLET | Freq: Four times a day (QID) | ORAL | Status: DC | PRN
  Filled 2024-01-18: qty 2

## 2024-01-18 MED ORDER — METOCLOPRAMIDE HCL 5 MG/ML IJ SOLN
10.0000 mg | Freq: Once | INTRAMUSCULAR | Status: AC
  Administered 2024-01-18: 10 mg via INTRAVENOUS
  Filled 2024-01-18: qty 2

## 2024-01-18 MED ORDER — MAGNESIUM SULFATE 2 GM/50ML IV SOLN
2.0000 g | Freq: Once | INTRAVENOUS | Status: AC
  Administered 2024-01-18: 2 g via INTRAVENOUS
  Filled 2024-01-18: qty 50

## 2024-01-18 MED ORDER — INSULIN GLARGINE-YFGN 100 UNIT/ML ~~LOC~~ SOLN
12.0000 [IU] | Freq: Every day | SUBCUTANEOUS | Status: DC
  Administered 2024-01-18 – 2024-01-19 (×2): 12 [IU] via SUBCUTANEOUS
  Filled 2024-01-18 (×2): qty 0.12

## 2024-01-18 MED ORDER — PANTOPRAZOLE SODIUM 40 MG IV SOLR
40.0000 mg | Freq: Once | INTRAVENOUS | Status: AC
  Administered 2024-01-18: 40 mg via INTRAVENOUS
  Filled 2024-01-18: qty 10

## 2024-01-18 MED ORDER — METOCLOPRAMIDE HCL 5 MG/ML IJ SOLN
10.0000 mg | Freq: Four times a day (QID) | INTRAMUSCULAR | Status: DC
  Administered 2024-01-18 – 2024-01-19 (×4): 10 mg via INTRAVENOUS
  Filled 2024-01-18 (×4): qty 2

## 2024-01-18 MED ORDER — INSULIN ASPART 100 UNIT/ML IJ SOLN
5.0000 [IU] | Freq: Once | INTRAMUSCULAR | Status: AC
  Administered 2024-01-18: 5 [IU] via SUBCUTANEOUS

## 2024-01-18 MED ORDER — INSULIN ASPART 100 UNIT/ML IJ SOLN
8.0000 [IU] | Freq: Once | INTRAMUSCULAR | Status: AC
  Administered 2024-01-18: 8 [IU] via INTRAVENOUS

## 2024-01-18 MED ORDER — POTASSIUM CHLORIDE IN NACL 20-0.9 MEQ/L-% IV SOLN
INTRAVENOUS | Status: AC
  Filled 2024-01-18 (×2): qty 1000

## 2024-01-18 MED ORDER — KETOROLAC TROMETHAMINE 30 MG/ML IJ SOLN
30.0000 mg | Freq: Once | INTRAMUSCULAR | Status: AC
Start: 2024-01-18 — End: 2024-01-18
  Administered 2024-01-18: 30 mg via INTRAVENOUS
  Filled 2024-01-18: qty 1

## 2024-01-18 MED ORDER — INSULIN ASPART 100 UNIT/ML IJ SOLN
0.0000 [IU] | Freq: Three times a day (TID) | INTRAMUSCULAR | Status: DC
  Administered 2024-01-19: 2 [IU] via SUBCUTANEOUS

## 2024-01-18 MED ORDER — CAPSAICIN 0.075 % EX CREA
TOPICAL_CREAM | Freq: Two times a day (BID) | CUTANEOUS | Status: DC
  Administered 2024-01-18: 1 via TOPICAL
  Filled 2024-01-18: qty 60

## 2024-01-18 NOTE — H&P (Signed)
 History and Physical    Patient: [name] ION:[id number] DOB: 03/17/1986 DOA: 01/18/2024 DOS: the patient was seen and examined on 01/18/2024 PCP: Verlene Glimpse, NP  Patient coming from: Home  Chief Complaint:  Chief Complaint  Patient presents with   Abdominal Pain   HPI: [name] is a 38 y.o. female with medical history significant of chronic constipation, diabetic neuropathy, varicella-zoster, noncompliance to medical treatment, type 1 diabetes, episodes of DKA, VIN grade 2, vaginal yeast infection, GERD, transaminitis, cholelithiasis, diabetic gastroparesis, history of intractable nausea and vomiting/cannabinoid hyperemesis who presented to the emergency department complaints of abdominal pain, nausea and emesis.  Per patient, she has not been using cannabis recently.  No diarrhea, constipation, melena or hematochezia.  No flank pain, dysuria, frequency or hematuria. She denied fever, chills, rhinorrhea, sore throat, wheezing or hemoptysis.  No chest pain, palpitations, diaphoresis, PND, orthopnea or pitting edema of the lower extremities.  No polyuria, polydipsia, polyphagia or blurred vision.   Lab work: Urinalysis shows greater than 1000 glucose, ketones of 40 and protein of 30 mg/dL.  UDS was positive for opiates, THC and fentanyl .  Urine pregnancy test was negative.  CBC showed white count 10.6, hemoglobin 12.2 g/dL platelets 244.  CMP showed a glucose of 400 mg/dL, sodium and chloride normalized after correction to glucose level.  The rest of the CMP measurements and lipase were normal.   ED course: Initial vital signs were temperature 98.8 F, pulse 114, respiration 20, BP 169/126 mmHg O2 sat 100% on room air.  The patient received 1000 mL of LR bolus, 1000 mL of normal saline bolus, metoclopramide  10 mg IVP x 1, ketorolac  30 mg IVP 1.25 mg IVP, hydromorphone  1 mg IVP, NovoLog  8 units SQ x 1 and 5 units SQ x 1.  Review of Systems: As mentioned in the  history of present illness. All other systems reviewed and are negative.  Past Medical History:  Diagnosis Date   Chronic constipation 11/08/2021   Diabetic neuropathy (HCC)    History of chicken pox    History of noncompliance with medical treatment 09/07/2022   Type 1 diabetes (HCC)    Vaginal odor 11/08/2021   Past Surgical History:  Procedure Laterality Date   CESAREAN SECTION     only one C/S per pt.   CO2 LASER APPLICATION N/A 02/22/2022   Procedure: CO2 LASER APPLICATION TO VULVA;  Surgeon: Suzi Essex, MD;  Location: [facility];  Service: General;  Laterality: N/A;   DILATION AND CURETTAGE OF UTERUS     EXCISION OF SKIN TAG N/A 02/22/2022   Procedure: EXCISION OF ANAL SKIN TAGS;  Surgeon: Joyce Nixon, MD;  Location: [facility] [place];  Service: General;  Laterality: N/A;   Social History:  reports that she has never smoked. She has never been exposed to tobacco smoke. She has never used smokeless tobacco. She reports that she does not drink alcohol and does not use drugs.  Allergies  Allergen Reactions   Mirapex [Pramipexole] Shortness Of Breath    Family History  Problem Relation Age of Onset   Hypertension Mother    Glaucoma Mother    Diabetes Maternal Grandmother    Neuropathy Neg Hx    Colon cancer Neg Hx    Stomach cancer Neg Hx    Esophageal cancer Neg Hx    Colon polyps Neg Hx    Rectal cancer Neg Hx     Prior to Admission medications  Medication Sig Start Date End Date Taking? Authorizing Provider  Acetaminophen  (MIDOL  PO) Take 1 tablet by mouth daily as needed (cramping).    [provider]  amoxicillin -clavulanate (AUGMENTIN ) 875-125 MG tablet Take 1 tablet by mouth 2 (two) times daily. 10/10/23   [provider]  Continuous Blood Gluc Transmit (DEXCOM G6 TRANSMITTER) MISC Inject 1 each into the skin every 14 (fourteen) days. 12/31/21   Versa Gore, NP  Continuous Glucose Sensor (DEXCOM G7 SENSOR)  MISC Inject 1 Device into the skin See admin instructions. Place 1 new sensor into the skin every 10 days    [provider]  DULoxetine  (CYMBALTA ) 60 MG capsule Take 60 mg by mouth at bedtime. 06/28/22   [provider]  insulin  aspart (NOVOLOG ) 100 UNIT/ML FlexPen Inject 0-9 Units into the skin 3 (three) times daily with meals. CBG < 70: Eat or drink something sweet and recheck, CBG 70 - 120: 0 units CBG 121 - 150: 1 unit CBG 151 - 200: 2 units CBG 201 - 250: 3 units CBG 251 - 300: 5 units CBG 301 - 350: 7 units CBG 351 - 400: 9 units CBG > 400: call MD. Patient taking differently: Inject 0-9 Units into the skin See admin instructions. Inject 0-9 units into the skin 3 times a day with meals, PER SLIDING SCALE: "CBG < 70: Eat or drink something sweet and recheck, CBG 70 - 120: 0 units CBG 121 - 150: 1 unit CBG 151 - 200: 2 units CBG 201 - 250: 3 units CBG 251 - 300: 5 units CBG 301 - 350: 7 units CBG 351 - 400: 9 units CBG > 400: call MD" 08/07/23 11/05/23  Casey Clay, MD  insulin  glargine (LANTUS ) 100 UNIT/ML Solostar Pen Inject 12 Units into the skin daily. May substitute as needed per insurance. 10/15/23 01/13/24  [place], [name], DO  Insulin  Pen Needle (B-D UF III MINI PEN NEEDLES) 31G X 5 MM MISC To use with Insulin  pens, as directed 08/07/23   Hongalgi, Thomasene Flemings, MD  Insulin  Pen Needle (PEN NEEDLES) 31G X 5 MM MISC 1 each by Does not apply route 3 (three) times daily. May dispense any manufacturer covered by patient's insurance. 10/15/23   [place], Rema Care, DO  metoCLOPramide  (REGLAN ) 10 MG tablet Take 1 tablet (10 mg total) by mouth every 6 (six) hours as needed for nausea or vomiting. 01/17/24   Onetha Bile, MD  oxyCODONE  (OXY IR/ROXICODONE ) 5 MG immediate release tablet Take 5 mg by mouth 4 (four) times daily as needed (for pain).    [provider]  promethazine  (PHENERGAN ) 25 MG suppository Place 1 suppository (25 mg total) rectally every 6 (six) hours as needed  for nausea or vomiting. 01/17/24   Onetha Bile, MD    Physical Exam: Vitals:   01/18/24 1030 01/18/24 1235 01/18/24 1300 01/18/24 1318  BP: 131/87 (!) 185/114 (!) 179/117 (!) 140/80  Pulse: 99 (!) 108 (!) 107 100  Resp: 12 14 15 20   Temp: 98.5 F (36.9 C)  98.3 F (36.8 C)   TempSrc: Oral  Oral   SpO2: 100% 100% 100% 100%  Weight:      Height:       Physical Exam Vitals and nursing note reviewed.  Constitutional:      General: She is awake. She is not in acute distress.    Appearance: She is well-developed. She is ill-appearing.  HENT:     Head: Normocephalic.     Nose:  No rhinorrhea.     Mouth/Throat:     Mouth: Mucous membranes are dry.  Eyes:     General: No scleral icterus.    Pupils: Pupils are equal, round, and reactive to light.  Neck:     Vascular: No JVD.  Cardiovascular:     Rate and Rhythm: Normal rate and regular rhythm.     Heart sounds: S1 normal and S2 normal.  Pulmonary:     Effort: Pulmonary effort is normal.     Breath sounds: Normal breath sounds. No wheezing, rhonchi or rales.  Abdominal:     General: Bowel sounds are normal. There is no distension.     Palpations: Abdomen is soft.     Tenderness: There is abdominal tenderness. There is no right CVA tenderness, left CVA tenderness or guarding.  Musculoskeletal:     Cervical back: Neck supple.     Right lower leg: No edema.     Left lower leg: No edema.  Skin:    General: Skin is warm and dry.  Neurological:     General: No focal deficit present.     Mental Status: She is alert and oriented to person, place, and time.  Psychiatric:        Mood and Affect: Mood normal.        Behavior: Behavior normal. Behavior is cooperative.    Data Reviewed:  Results are pending, will review when available. 04/01/2023 echocardiogram report. IMPRESSIONS:   1. Left ventricular ejection fraction, by estimation, is 65 to 70%. The  left ventricle has normal function. The left ventricle has no  regional  wall motion abnormalities. Left ventricular diastolic parameters were  normal.   2. Right ventricular systolic function is normal. The right ventricular  size is normal. Tricuspid regurgitation signal is inadequate for assessing  PA pressure.   3. The mitral valve is normal in structure. No evidence of mitral valve  regurgitation. No evidence of mitral stenosis.   4. The aortic valve is tricuspid. Aortic valve regurgitation is not  visualized. No aortic stenosis is present.   5. The inferior vena cava is normal in size with greater than 50%  respiratory variability, suggesting right atrial pressure of 3 mmHg.   EKG: Vent. rate 101 BPM PR interval 143 ms QRS duration 75 ms QT/QTcB 362/470 ms P-R-T axes 56 78 28 Sinus tachycardia Borderline T abnormalities, anterior leads  Assessment and Plan: Principal Problem:   Intractable nausea and vomiting In the setting of:   Cannabis use disorder Superimposed on:   Gastroparesis   Cholelithiasis   Gastroesophageal reflux disease  with esophagitis without hemorrhage Observation/telemetry. Continue IV fluids. Keep n.p.o. for now. Analgesics as needed. Antiemetics as needed. Pantoprazole  40 mg IVP x1. Follow CBC, CMP in AM.  Active Problems:   Uncontrolled type 1 diabetes mellitus  with hyperglycemia, with long-term current use of insulin  (HCC) Associated with:   Pseudohyponatremia Continue IV fluids for now.   Advance diet as tolerated. CBG monitoring with RI SS. Check hemoglobin A1c.     Advance Care Planning:   Code Status: Full Code   Consults:   Family Communication:   Severity of Illness: The appropriate patient status for this patient is OBSERVATION. Observation status is judged to be reasonable and necessary in order to provide the required intensity of service to ensure the patient's safety. The patient's presenting symptoms, physical exam findings, and initial radiographic and laboratory data in the  context of their medical condition is felt to place  them at decreased risk for further clinical deterioration. Furthermore, it is anticipated that the patient will be medically stable for discharge from the hospital within 2 midnights of admission.   Author: Danice Dural, MD 01/18/2024 1:46 PM  For on call review [email or web address].   This document was prepared using Dragon voice recognition software and may contain some unintended transcription errors.

## 2024-01-18 NOTE — ED Notes (Signed)
 I have just given report to Atahe, Charity fundraiser at [employer]. [facility] is here to transport.

## 2024-01-18 NOTE — ED Provider Notes (Signed)
 [place] [facility]MENT AT [facility] Provider Note   CSN: [id number] Arrival date & time: 01/18/24  0554     History  Chief Complaint  Patient presents with   Abdominal Pain    Valentine L McAdoo Collier is a 38 y.o. female.  Patient is a 38 year old female with past medical history of diabetes, diabetic gastroparesis, chronic pain, chronic cannabinoid use.  Patient presenting today with abdominal pain, nausea, and vomiting.  Patient well-known to the emergency department for frequent visits involving diabetic gastroparesis/cannabinoid hyperemesis.  This is her third day in a row presenting here with abdominal pain and vomiting.  No fevers or chills.  No ill contacts and denies having consumed any undercooked or suspicious foods.       Home Medications Prior to Admission medications   Medication Sig Start Date End Date Taking? Authorizing Provider  Acetaminophen  (MIDOL  PO) Take 1 tablet by mouth daily as needed (cramping).    [provider]  amoxicillin -clavulanate (AUGMENTIN ) 875-125 MG tablet Take 1 tablet by mouth 2 (two) times daily. 10/10/23   [provider]  Continuous Blood Gluc Transmit (DEXCOM G6 TRANSMITTER) MISC Inject 1 each into the skin every 14 (fourteen) days. 12/31/21   Versa Gore, NP  Continuous Glucose Sensor (DEXCOM G7 SENSOR) MISC Inject 1 Device into the skin See admin instructions. Place 1 new sensor into the skin every 10 days    [provider]  DULoxetine  (CYMBALTA ) 60 MG capsule Take 60 mg by mouth at bedtime. 06/28/22   [provider]  insulin  aspart (NOVOLOG ) 100 UNIT/ML FlexPen Inject 0-9 Units into the skin 3 (three) times daily with meals. CBG < 70: Eat or drink something sweet and recheck, CBG 70 - 120: 0 units CBG 121 - 150: 1 unit CBG 151 - 200: 2 units CBG 201 - 250: 3 units CBG 251 - 300: 5 units CBG 301 - 350: 7 units CBG 351 - 400: 9 units CBG > 400: call MD. Patient taking differently:  Inject 0-9 Units into the skin See admin instructions. Inject 0-9 units into the skin 3 times a day with meals, PER SLIDING SCALE: "CBG < 70: Eat or drink something sweet and recheck, CBG 70 - 120: 0 units CBG 121 - 150: 1 unit CBG 151 - 200: 2 units CBG 201 - 250: 3 units CBG 251 - 300: 5 units CBG 301 - 350: 7 units CBG 351 - 400: 9 units CBG > 400: call MD" 08/07/23 11/05/23  Casey Clay, MD  insulin  glargine (LANTUS ) 100 UNIT/ML Solostar Pen Inject 12 Units into the skin daily. May substitute as needed per insurance. 10/15/23 01/13/24  [place], [name], DO  Insulin  Pen Needle (B-D UF III MINI PEN NEEDLES) 31G X 5 MM MISC To use with Insulin  pens, as directed 08/07/23   Hongalgi, Thomasene Flemings, MD  Insulin  Pen Needle (PEN NEEDLES) 31G X 5 MM MISC 1 each by Does not apply route 3 (three) times daily. May dispense any manufacturer covered by patient's insurance. 10/15/23   [place], Rema Care, DO  metoCLOPramide  (REGLAN ) 10 MG tablet Take 1 tablet (10 mg total) by mouth every 6 (six) hours as needed for nausea or vomiting. 01/17/24   Onetha Bile, MD  oxyCODONE  (OXY IR/ROXICODONE ) 5 MG immediate release tablet Take 5 mg by mouth 4 (four) times daily as needed (for pain).    [provider]  promethazine  (PHENERGAN ) 25 MG suppository Place 1 suppository (25 mg total) rectally every 6 (  six) hours as needed for nausea or vomiting. 01/17/24   Onetha Bile, MD      Allergies    Mirapex [pramipexole]    Review of Systems   Review of Systems  All other systems reviewed and are negative.   Physical Exam Updated Vital Signs BP (!) 169/126   Pulse (!) 114   Temp 98.8 F (37.1 C) (Oral)   Resp 20   Ht 5\' 6"  (1.676 m)   Wt 74.4 kg   LMP 01/13/2024   SpO2 100%   BMI 26.47 kg/m  Physical Exam Vitals and nursing note reviewed.  Constitutional:      General: She is not in acute distress.    Appearance: She is well-developed. She is not diaphoretic.  HENT:     Head: Normocephalic and  atraumatic.  Cardiovascular:     Rate and Rhythm: Normal rate and regular rhythm.     Heart sounds: No murmur heard.    No friction rub. No gallop.  Pulmonary:     Effort: Pulmonary effort is normal. No respiratory distress.     Breath sounds: Normal breath sounds. No wheezing.  Abdominal:     General: Bowel sounds are normal. There is no distension.     Palpations: Abdomen is soft.     Tenderness: There is generalized abdominal tenderness. There is no right CVA tenderness, left CVA tenderness, guarding or rebound.  Musculoskeletal:        General: Normal range of motion.     Cervical back: Normal range of motion and neck supple.  Skin:    General: Skin is warm and dry.  Neurological:     General: No focal deficit present.     Mental Status: She is alert and oriented to person, place, and time.     ED Results / Procedures / Treatments   Labs (all labs ordered are listed, but only abnormal results are displayed) Labs Reviewed  CBC - Abnormal; Notable for the following components:      Result Value   WBC 10.6 (*)    All other components within normal limits  LIPASE, BLOOD  COMPREHENSIVE METABOLIC PANEL WITH GFR  URINALYSIS, ROUTINE W REFLEX MICROSCOPIC  PREGNANCY, URINE    EKG None  Radiology No results found.  Procedures Procedures    Medications Ordered in ED Medications  sodium chloride  0.9 % bolus 1,000 mL (has no administration in time range)  HYDROmorphone  (DILAUDID ) injection 1 mg (has no administration in time range)  metoCLOPramide  (REGLAN ) injection 10 mg (has no administration in time range)  ketorolac  (TORADOL ) 30 MG/ML injection 30 mg (has no administration in time range)    ED Course/ Medical Decision Making/ A&P  Patient is a 38 year old female with history of type 1 diabetes/diabetic gastroparesis presenting with nausea and vomiting and abdominal pain.  Patient arrives with stable vital signs and is afebrile.  She has generalized abdominal  tenderness, but no peritoneal signs.  Laboratory studies obtained including CBC, CMP, and lipase and are all currently pending.  Patient given IV fluids along with Toradol  and Dilaudid  for pain and Zofran  for nausea.  Care signed out to oncoming provider awaiting results of laboratory studies and reassessment.  Final Clinical Impression(s) / ED Diagnoses Final diagnoses:  None    Rx / DC Orders ED Discharge Orders     None         Orvilla Blander, MD 01/18/24 [phone]

## 2024-01-18 NOTE — ED Provider Notes (Signed)
 Care was taken over from Dr. Lula Sale.  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  She has been seen multiple times in the ED for nausea and vomiting.  She presents with same symptoms and associated upper abdominal pain.  Similar to prior presentations.  Her labs are nonconcerning.  Her glucose is elevated at 400 but there is no suggestions of DKA.  No anion gap or acidosis.  She was given IV fluids and antiemetics.  She does remain tachycardic with heart rates in the 110s.  She is not febrile.  Her abdominal pain is similar to prior episodes so I do not feel that further imaging is indicated.  She has a negative CT scan about a month ago for similar symptoms.  She was given Reglan  and droperidol .  Initially she was given dose of Dilaudid .  She initially was feeling better but now she is nauseated again and does not think she can drink anything.  Request admission.  I advised her that we would not continue giving opioid medications but would try some capsaicin  cream.  Discussed with Dr. Carloyn Chi who will admit the patient for further treatment.   Hershel Los, MD 01/18/24 1225

## 2024-01-18 NOTE — ED Notes (Signed)
 Called Karen at [facility] for transport

## 2024-01-18 NOTE — ED Triage Notes (Signed)
 Patient comes in stating she is diabetic and she has fluctuating blood sugar as well as abdominal pain. She said last time her blood sugar was in the 300s. She reports they usually give her pain medications, nausea medications, and check her sugar.

## 2024-01-19 DIAGNOSIS — R112 Nausea with vomiting, unspecified: Secondary | ICD-10-CM | POA: Diagnosis not present

## 2024-01-19 LAB — CBC
HCT: 32.9 % — ABNORMAL LOW (ref 36.0–46.0)
Hemoglobin: 10.6 g/dL — ABNORMAL LOW (ref 12.0–15.0)
MCH: 30.6 pg (ref 26.0–34.0)
MCHC: 32.2 g/dL (ref 30.0–36.0)
MCV: 95.1 fL (ref 80.0–100.0)
Platelets: 283 10*3/uL (ref 150–400)
RBC: 3.46 MIL/uL — ABNORMAL LOW (ref 3.87–5.11)
RDW: 13.2 % (ref 11.5–15.5)
WBC: 9.2 10*3/uL (ref 4.0–10.5)
nRBC: 0 % (ref 0.0–0.2)

## 2024-01-19 LAB — COMPREHENSIVE METABOLIC PANEL WITH GFR
ALT: 11 U/L (ref 0–44)
AST: 14 U/L — ABNORMAL LOW (ref 15–41)
Albumin: 3 g/dL — ABNORMAL LOW (ref 3.5–5.0)
Alkaline Phosphatase: 65 U/L (ref 38–126)
Anion gap: 8 (ref 5–15)
BUN: 6 mg/dL (ref 6–20)
CO2: 26 mmol/L (ref 22–32)
Calcium: 8.3 mg/dL — ABNORMAL LOW (ref 8.9–10.3)
Chloride: 102 mmol/L (ref 98–111)
Creatinine, Ser: 0.63 mg/dL (ref 0.44–1.00)
GFR, Estimated: >60 mL/min (ref >60)
Glucose, Bld: 206 mg/dL — ABNORMAL HIGH (ref 70–99)
Potassium: 3.6 mmol/L (ref 3.5–5.1)
Sodium: 136 mmol/L (ref 135–145)
Total Bilirubin: 0.7 mg/dL (ref 0.0–1.2)
Total Protein: 5.9 g/dL — ABNORMAL LOW (ref 6.5–8.1)

## 2024-01-19 LAB — GLUCOSE, CAPILLARY
Glucose-Capillary: 184 mg/dL — ABNORMAL HIGH (ref 70–99)
Glucose-Capillary: 173 mg/dL — ABNORMAL HIGH (ref 70–99)

## 2024-01-19 MED ORDER — HYDROMORPHONE HCL 1 MG/ML IJ SOLN
0.5000 mg | INTRAMUSCULAR | Status: DC | PRN
  Administered 2024-01-19: 0.5 mg via INTRAVENOUS
  Filled 2024-01-19: qty 0.5

## 2024-01-19 MED ORDER — PROCHLORPERAZINE 25 MG RE SUPP: 25.0000 mg | Freq: Two times a day (BID) | RECTAL | 0 refills | Status: DC | PRN

## 2024-01-19 MED ORDER — CAPSAICIN 0.075 % EX CREA: TOPICAL_CREAM | Freq: Two times a day (BID) | CUTANEOUS | 0 refills | Status: DC

## 2024-01-19 NOTE — Inpatient Diabetes Management (Addendum)
 Inpatient Diabetes Program Recommendations  AACE/ADA: New Consensus Statement on Inpatient Glycemic Control (2015)  Target Ranges:  Prepandial:   less than 140 mg/dL      Peak postprandial:   less than 180 mg/dL (1-2 hours)      Critically ill patients:  140 - 180 mg/dL   Lab Results  Component Value Date   GLUCAP 184 (H) 01/19/2024   HGBA1C 9.4 (H) 08/27/2023    Review of Glycemic Control  Latest Reference Range & Units 01/18/24 15:50 01/18/24 17:40 01/18/24 21:59 01/19/24 07:57  Glucose-Capillary 70 - 99 mg/dL 161 (H) 096 (H) 045 (H) 184 (H)  (H): Data is abnormally high Diabetes history: Type 1 DM Outpatient Diabetes medications: Lantus  12 units every day, Novolog  0-9 units TID Current orders for Inpatient glycemic control: Semglee  12 units every day, Novolog  0-9 units TID  Inpatient Diabetes Program Recommendations:    If diet to advance, consider adding Novolog  2 units TID (assuming patient consuming >50% of meals)  Also, no recent A1C could consider repeating?  Thanks, Marjo Sievert, MSN, RNC-OB Diabetes Coordinator [phone] (8a-5p)

## 2024-01-19 NOTE — Progress Notes (Signed)
   01/19/24 1030  TOC Brief Assessment  Insurance and Status Reviewed  Patient has primary care physician Yes  Home environment has been reviewed Single family home with spouse  Prior level of function: Independent  Prior/Current Home Services No current home services  Social Drivers of Health Review SDOH reviewed no interventions necessary  Readmission risk has been reviewed Yes  Transition of care needs no transition of care needs at this time

## 2024-01-19 NOTE — Plan of Care (Signed)

## 2024-01-19 NOTE — Discharge Summary (Signed)
 Physician Discharge Summary  [name] ZOX:[id number] DOB: May 28, 1986 DOA: 01/18/2024  PCP: Caitlyn Glimpse, NP  Admit date: 01/18/2024 Discharge date: 01/19/2024  Admitted From: Home Discharge disposition: Home   Recommendations for Outpatient Follow-Up:   Marijuana cessation   Discharge Diagnosis:   Principal Problem:   Intractable nausea and vomiting Active Problems:   Gastroesophageal reflux disease with esophagitis without hemorrhage   Gastroparesis   Uncontrolled type 1 diabetes mellitus with hyperglycemia, with long-term current use of insulin  (HCC)   Cholelithiasis   Cannabis use disorder   Pseudohyponatremia    Discharge Condition: Improved.  Diet recommendation:  Carbohydrate-modified.  Wound care: None.  Code status: Full.   History of Present Illness:   [name] is a 38 y.o. female with medical history significant of chronic constipation, diabetic neuropathy, varicella-zoster, noncompliance to medical treatment, type 1 diabetes, episodes of DKA, VIN grade 2, vaginal yeast infection, GERD, transaminitis, cholelithiasis, diabetic gastroparesis, history of intractable nausea and vomiting/cannabinoid hyperemesis who presented to the emergency department complaints of abdominal pain, nausea and emesis.  Per patient, she has not been using cannabis recently.  No diarrhea, constipation, melena or hematochezia.  No flank pain, dysuria, frequency or hematuria. She denied fever, chills, rhinorrhea, sore throat, wheezing or hemoptysis.  No chest pain, palpitations, diaphoresis, PND, orthopnea or pitting edema of the lower extremities.  No polyuria, polydipsia, polyphagia or blurred vision.    Lab work: Urinalysis shows greater than 1000 glucose, ketones of 40 and protein of 30 mg/dL.  UDS was positive for opiates, THC and fentanyl .  Urine pregnancy test was negative.  CBC showed white count 10.6, hemoglobin 12.2 g/dL platelets 811.  CMP  showed a glucose of 400 mg/dL, sodium and chloride normalized after correction to glucose level.  The rest of the CMP measurements and lipase were normal.   ED course: Initial vital signs were temperature 98.8 F, pulse 114, respiration 20, BP 169/126 mmHg O2 sat 100% on room air.  The patient received 1000 mL of LR bolus, 1000 mL of normal saline bolus, metoclopramide  10 mg IVP x 1, ketorolac  30 mg IVP 1.25 mg IVP, hydromorphone  1 mg IVP, NovoLog  8 units SQ x 1 and 5 units SQ x 1.   Hospital Course by Problem:   Intractable nausea and vomiting In the setting of:   Cannabis use disorder Superimposed on:   Gastroparesis   Cholelithiasis   Gastroesophageal reflux disease  with esophagitis without hemorrhage -Eating well and symptoms resolved on a.m. of 6/2, would like to go home - As needed Compazine  suppositories for refractory nausea - Continue Reglan  -Marijuana cessation and good blood sugar control      Uncontrolled type 1 diabetes mellitus  with hyperglycemia, with long-term current use of insulin  (HCC) Associated with:   Pseudohyponatremia -Resume home meds   Medical Consultants:    None  Discharge Exam:   Vitals:   01/18/24 2200 01/19/24 0401  BP: 121/87 125/78  Pulse: 80 94  Resp: 18 16  Temp: 98 F (36.7 C) 98.1 F (36.7 C)  SpO2: 100% 100%   Vitals:   01/18/24 1500 01/18/24 1603 01/18/24 2200 01/19/24 0401  BP:  109/80 121/87 125/78  Pulse:  80 80 94  Resp:  16 18 16   Temp:  97.9 F (36.6 C) 98 F (36.7 C) 98.1 F (36.7 C)  TempSrc:  Oral    SpO2:  100% 100% 100%  Weight:      Height:  5\' 6"  (1.676 m)       General exam: Appears calm and comfortable.    The results of significant diagnostics from this hospitalization (including imaging, microbiology, ancillary and laboratory) are listed below for reference.     Procedures and Diagnostic Studies:   No results found.   Labs:   Basic Metabolic Panel: Recent Labs  Lab 01/16/24 2044  01/17/24 1430 01/18/24 0609 01/19/24 0453  NA 138 137 133* 136  K 3.9 3.2* 3.7 3.6  CL 95* 93* 92* 102  CO2 25 29 25 26   GLUCOSE 230* 231* 400* 206*  BUN 13 10 7 6   CREATININE 0.89 0.71 0.85 0.63  CALCIUM  10.4* 9.4 9.9 8.3*   GFR Estimated Creatinine Clearance: 99.3 mL/min (by C-G formula based on SCr of 0.63 mg/dL). Liver Function Tests: Recent Labs  Lab 01/16/24 2044 01/17/24 1430 01/18/24 0609 01/19/24 0453  AST 23 32 23 14*  ALT 12 14 10 11   ALKPHOS 119 92 99 65  BILITOT 0.3 0.5 0.3 0.7  PROT 8.7* 8.3* 7.8 5.9*  ALBUMIN 4.5 4.0 4.1 3.0*   Recent Labs  Lab 01/16/24 2044 01/17/24 1430 01/18/24 0609  LIPASE 12 21 20    No results for input(s): "AMMONIA" in the last 168 hours. Coagulation profile No results for input(s): "INR", "PROTIME" in the last 168 hours.  CBC: Recent Labs  Lab 01/16/24 2044 01/17/24 1430 01/18/24 0609 01/19/24 0453  WBC 9.7 12.6* 10.6* 9.2  HGB 14.2 12.9 12.2 10.6*  HCT 41.2 38.3 36.0 32.9*  MCV 88.4 90.1 89.6 95.1  PLT 352 347 312 283   Cardiac Enzymes: No results for input(s): "CKTOTAL", "CKMB", "CKMBINDEX", "TROPONINI" in the last 168 hours. BNP: Invalid input(s): "POCBNP" CBG: Recent Labs  Lab 01/18/24 1143 01/18/24 1550 01/18/24 1740 01/18/24 2159 01/19/24 0757  GLUCAP 222* 227* 245* 164* 184*   D-Dimer No results for input(s): "DDIMER" in the last 72 hours. Hgb A1c No results for input(s): "HGBA1C" in the last 72 hours. Lipid Profile No results for input(s): "CHOL", "HDL", "LDLCALC", "TRIG", "CHOLHDL", "LDLDIRECT" in the last 72 hours. Thyroid  function studies No results for input(s): "TSH", "T4TOTAL", "T3FREE", "THYROIDAB" in the last 72 hours.  Invalid input(s): "FREET3" Anemia work up No results for input(s): "VITAMINB12", "FOLATE", "FERRITIN", "TIBC", "IRON", "RETICCTPCT" in the last 72 hours. Microbiology No results found for this or any previous visit (from the past 240 hours).   Discharge  Instructions:   Discharge Instructions     Diet Carb Modified   Complete by: As directed    Increase activity slowly   Complete by: As directed       Allergies as of 01/19/2024       Reactions   Mirapex [pramipexole] Shortness Of Breath        Medication List     TAKE these medications    B-D UF III MINI PEN NEEDLES 31G X 5 MM Misc Generic drug: Insulin  Pen Needle To use with Insulin  pens, as directed   Pen Needles 31G X 5 MM Misc 1 each by Does not apply route 3 (three) times daily. May dispense any manufacturer covered by patient's insurance.   capsicum 0.075 % topical cream Commonly known as: ZOSTRIX Apply topically 2 (two) times daily. To abdomen   Dexcom G6 Transmitter Misc Inject 1 each into the skin every 14 (fourteen) days.   Dexcom G7 Sensor Misc Inject 1 Device into the skin See admin instructions. Place 1 new sensor into the skin every 10 days  Drysol 20 % external solution Generic drug: aluminum chloride Apply 1 Application topically 2 (two) times a week.   DULoxetine  60 MG capsule Commonly known as: CYMBALTA  Take 60 mg by mouth at bedtime.   insulin  aspart 100 UNIT/ML FlexPen Commonly known as: NOVOLOG  Inject 0-9 Units into the skin 3 (three) times daily with meals. CBG < 70: Eat or drink something sweet and recheck, CBG 70 - 120: 0 units CBG 121 - 150: 1 unit CBG 151 - 200: 2 units CBG 201 - 250: 3 units CBG 251 - 300: 5 units CBG 301 - 350: 7 units CBG 351 - 400: 9 units CBG > 400: call MD. What changed:  when to take this additional instructions   insulin  glargine 100 UNIT/ML Solostar Pen Commonly known as: LANTUS  Inject 12 Units into the skin daily. May substitute as needed per insurance.   metoCLOPramide  10 MG tablet Commonly known as: REGLAN  Take 1 tablet (10 mg total) by mouth every 6 (six) hours as needed for nausea or vomiting.   MIDOL  PO Take 1 tablet by mouth daily as needed (cramping).   oxyCODONE  15 MG immediate release  tablet Commonly known as: ROXICODONE  Take 15 mg by mouth 4 (four) times daily.   prochlorperazine  25 MG suppository Commonly known as: COMPAZINE  Place 1 suppository (25 mg total) rectally every 12 (twelve) hours as needed for refractory nausea / vomiting.   promethazine  25 MG suppository Commonly known as: PHENERGAN  Place 1 suppository (25 mg total) rectally every 6 (six) hours as needed for nausea or vomiting.          Time coordinating discharge: 45 minutes  Signed:  Enrigue Harvard DO  [facility] 01/19/2024, 10:03 AM

## 2024-02-21 ENCOUNTER — Emergency Department (HOSPITAL_BASED_OUTPATIENT_CLINIC_OR_DEPARTMENT_OTHER)
Admission: EM | Admit: 2024-02-21 | Discharge: 2024-02-22 | Disposition: A | Discharge status: Home / Self Care | Attending: Emergency Medicine | Admitting: Emergency Medicine

## 2024-02-21 ENCOUNTER — Other Ambulatory Visit: Payer: Self-pay

## 2024-02-21 DIAGNOSIS — D72829 Elevated white blood cell count, unspecified: Secondary | ICD-10-CM | POA: Insufficient documentation

## 2024-02-21 DIAGNOSIS — E1165 Type 2 diabetes mellitus with hyperglycemia: Secondary | ICD-10-CM | POA: Insufficient documentation

## 2024-02-21 DIAGNOSIS — E1140 Type 2 diabetes mellitus with diabetic neuropathy, unspecified: Secondary | ICD-10-CM | POA: Diagnosis not present

## 2024-02-21 DIAGNOSIS — E860 Dehydration: Secondary | ICD-10-CM | POA: Diagnosis not present

## 2024-02-21 DIAGNOSIS — R112 Nausea with vomiting, unspecified: Secondary | ICD-10-CM | POA: Insufficient documentation

## 2024-02-21 DIAGNOSIS — Z794 Long term (current) use of insulin: Secondary | ICD-10-CM | POA: Insufficient documentation

## 2024-02-21 DIAGNOSIS — R739 Hyperglycemia, unspecified: Secondary | ICD-10-CM

## 2024-02-21 NOTE — ED Triage Notes (Signed)
 Pt POV d/t ABD pain and all over pain and cannot get her Blood sugar down.  Pt is Type 1 Diabetic.

## 2024-02-22 ENCOUNTER — Emergency Department (HOSPITAL_BASED_OUTPATIENT_CLINIC_OR_DEPARTMENT_OTHER)

## 2024-02-22 ENCOUNTER — Encounter (HOSPITAL_BASED_OUTPATIENT_CLINIC_OR_DEPARTMENT_OTHER): Payer: Self-pay

## 2024-02-22 LAB — I-STAT VENOUS BLOOD GAS, ED
Acid-Base Excess: 0 mmol/L (ref 0.0–2.0)
Acid-base deficit: 10 mmol/L — ABNORMAL HIGH (ref 0.0–2.0)
Bicarbonate: 16.9 mmol/L — ABNORMAL LOW (ref 20.0–28.0)
Bicarbonate: 24.4 mmol/L (ref 20.0–28.0)
Calcium, Ion: 0.99 mmol/L — ABNORMAL LOW (ref 1.15–1.40)
Calcium, Ion: 1.19 mmol/L (ref 1.15–1.40)
HCT: 36 % (ref 36.0–46.0)
HCT: 37 % (ref 36.0–46.0)
Hemoglobin: 12.2 g/dL (ref 12.0–15.0)
Hemoglobin: 12.6 g/dL (ref 12.0–15.0)
O2 Saturation: 41 %
O2 Saturation: 59 %
Patient temperature: 98.1
Patient temperature: 98.1
Potassium: 3.4 mmol/L — ABNORMAL LOW (ref 3.5–5.1)
Potassium: 4.2 mmol/L (ref 3.5–5.1)
Sodium: 103 mmol/L — CL (ref 135–145)
Sodium: 134 mmol/L — ABNORMAL LOW (ref 135–145)
TCO2: 18 mmol/L — ABNORMAL LOW (ref 22–32)
TCO2: 26 mmol/L (ref 22–32)
pCO2, Ven: 41 mmHg — ABNORMAL LOW (ref 44–60)
pCO2, Ven: 37.1 mmHg — ABNORMAL LOW (ref 44–60)
pH, Ven: 7.221 — ABNORMAL LOW (ref 7.25–7.43)
pH, Ven: 7.425 (ref 7.25–7.43)
pO2, Ven: 27 mmHg — CL (ref 32–45)
pO2, Ven: 30 mmHg — CL (ref 32–45)

## 2024-02-22 LAB — CBC WITH DIFFERENTIAL/PLATELET
Abs Immature Granulocytes: 0.03 K/uL (ref 0.00–0.07)
Basophils Absolute: 0 K/uL (ref 0.0–0.1)
Basophils Relative: 0 %
Eosinophils Absolute: 0.1 K/uL (ref 0.0–0.5)
Eosinophils Relative: 1 %
HCT: 39.1 % (ref 36.0–46.0)
Hemoglobin: 13.6 g/dL (ref 12.0–15.0)
Immature Granulocytes: 0 %
Lymphocytes Relative: 15 %
Lymphs Abs: 1.6 K/uL (ref 0.7–4.0)
MCH: 30.9 pg (ref 26.0–34.0)
MCHC: 34.8 g/dL (ref 30.0–36.0)
MCV: 88.9 fL (ref 80.0–100.0)
Monocytes Absolute: 0.4 K/uL (ref 0.1–1.0)
Monocytes Relative: 4 %
Neutro Abs: 8.5 K/uL — ABNORMAL HIGH (ref 1.7–7.7)
Neutrophils Relative %: 80 %
Platelets: 347 K/uL (ref 150–400)
RBC: 4.4 MIL/uL (ref 3.87–5.11)
RDW: 14.1 % (ref 11.5–15.5)
WBC: 10.6 K/uL — ABNORMAL HIGH (ref 4.0–10.5)
nRBC: 0 % (ref 0.0–0.2)

## 2024-02-22 LAB — COMPREHENSIVE METABOLIC PANEL WITH GFR
ALT: 80 U/L — ABNORMAL HIGH (ref 0–44)
AST: 33 U/L (ref 15–41)
Albumin: 4.3 g/dL (ref 3.5–5.0)
Alkaline Phosphatase: 147 U/L — ABNORMAL HIGH (ref 38–126)
Anion gap: 12 (ref 5–15)
BUN: 14 mg/dL (ref 6–20)
CO2: 25 mmol/L (ref 22–32)
Calcium: 10.2 mg/dL (ref 8.9–10.3)
Chloride: 98 mmol/L (ref 98–111)
Creatinine, Ser: 0.82 mg/dL (ref 0.44–1.00)
GFR, Estimated: >60 mL/min (ref >60)
Glucose, Bld: 338 mg/dL — ABNORMAL HIGH (ref 70–99)
Potassium: 4.3 mmol/L (ref 3.5–5.1)
Sodium: 135 mmol/L (ref 135–145)
Total Bilirubin: 0.5 mg/dL (ref 0.0–1.2)
Total Protein: 8 g/dL (ref 6.5–8.1)

## 2024-02-22 LAB — URINALYSIS, ROUTINE W REFLEX MICROSCOPIC
Bilirubin Urine: NEGATIVE
Glucose, UA: >1000 mg/dL — AB
Ketones, ur: NEGATIVE mg/dL
Leukocytes,Ua: NEGATIVE
Nitrite: NEGATIVE
Protein, ur: 100 mg/dL — AB
Specific Gravity, Urine: 1.03 (ref 1.005–1.030)
pH: 5.5 (ref 5.0–8.0)

## 2024-02-22 LAB — LACTIC ACID, PLASMA
Lactic Acid, Venous: 2.8 mmol/L (ref 0.5–1.9)
Lactic Acid, Venous: 2.4 mmol/L (ref 0.5–1.9)

## 2024-02-22 LAB — BETA-HYDROXYBUTYRIC ACID: Beta-Hydroxybutyric Acid: 0.15 mmol/L (ref 0.05–0.27)

## 2024-02-22 LAB — LIPASE, BLOOD: Lipase: 27 U/L (ref 11–51)

## 2024-02-22 LAB — CBG MONITORING, ED: Glucose-Capillary: 360 mg/dL — ABNORMAL HIGH (ref 70–99)

## 2024-02-22 LAB — PREGNANCY, URINE: Preg Test, Ur: NEGATIVE

## 2024-02-22 MED ORDER — DROPERIDOL 2.5 MG/ML IJ SOLN
1.2500 mg | Freq: Once | INTRAMUSCULAR | Status: AC
  Administered 2024-02-22: 1.25 mg via INTRAVENOUS
  Filled 2024-02-22: qty 2

## 2024-02-22 MED ORDER — SODIUM CHLORIDE 0.9 % IV BOLUS
1000.0000 mL | Freq: Once | INTRAVENOUS | Status: AC
  Administered 2024-02-22: 1000 mL via INTRAVENOUS

## 2024-02-22 MED ORDER — OXYCODONE-ACETAMINOPHEN 5-325 MG PO TABS
2.0000 | ORAL_TABLET | Freq: Once | ORAL | Status: AC
  Administered 2024-02-22: 2 via ORAL
  Filled 2024-02-22: qty 2

## 2024-02-22 MED ORDER — IOHEXOL 300 MG/ML  SOLN
100.0000 mL | Freq: Once | INTRAMUSCULAR | Status: AC | PRN
  Administered 2024-02-22: 100 mL via INTRAVENOUS

## 2024-02-22 MED ORDER — PROCHLORPERAZINE EDISYLATE 10 MG/2ML IJ SOLN
10.0000 mg | Freq: Once | INTRAMUSCULAR | Status: AC
  Administered 2024-02-22: 10 mg via INTRAVENOUS
  Filled 2024-02-22: qty 2

## 2024-02-22 MED ORDER — ONDANSETRON HCL 4 MG/2ML IJ SOLN
4.0000 mg | Freq: Once | INTRAMUSCULAR | Status: AC
  Administered 2024-02-22: 4 mg via INTRAVENOUS
  Filled 2024-02-22: qty 2

## 2024-02-22 MED ORDER — PROMETHAZINE HCL 25 MG RE SUPP: 25.0000 mg | Freq: Four times a day (QID) | RECTAL | 0 refills | Status: DC | PRN

## 2024-02-22 MED ORDER — DIPHENHYDRAMINE HCL 50 MG/ML IJ SOLN
25.0000 mg | Freq: Once | INTRAMUSCULAR | Status: AC
  Administered 2024-02-22: 25 mg via INTRAVENOUS
  Filled 2024-02-22: qty 1

## 2024-02-22 MED ORDER — LORAZEPAM 2 MG/ML IJ SOLN
1.0000 mg | Freq: Once | INTRAMUSCULAR | Status: AC
  Administered 2024-02-22: 1 mg via INTRAVENOUS
  Filled 2024-02-22: qty 1

## 2024-02-22 NOTE — ED Provider Notes (Signed)
 [facility] AT [facility] Provider Note   CSN: [id number] Arrival date & time: 02/21/24  2352     Patient presents with: Emesis   [name] is a 38 y.o. female.   HPI     This is a 38 year old female who presents with body pain, nausea, vomiting.  History of type 1 diabetes, gastroparesis.  Significant recurrent ED visits for nausea and vomiting.  Reports pain all over.  States that her blood sugars at home have been in the 400s.  She report compliance with her insulin .  Denies any diarrhea.  Denies any chest pain or shortness of breath.  Does not believe herself to be pregnant.  Prior to Admission medications   Medication Sig Start Date End Date Taking? Authorizing Provider  promethazine  (PHENERGAN ) 25 MG suppository Place 1 suppository (25 mg total) rectally every 6 (six) hours as needed for nausea or vomiting. 02/22/24  Yes Atharv Barriere, [name], MD  Acetaminophen  (MIDOL  PO) Take 1 tablet by mouth daily as needed (cramping).    [provider]  capsicum (ZOSTRIX) 0.075 % topical cream Apply topically 2 (two) times daily. To abdomen 01/19/24   Juvenal Raisin U, DO  Continuous Blood Gluc Transmit (DEXCOM G6 TRANSMITTER) MISC Inject 1 each into the skin every 14 (fourteen) days. 12/31/21   Lucius Krabbe, NP  Continuous Glucose Sensor (DEXCOM G7 SENSOR) MISC Inject 1 Device into the skin See admin instructions. Place 1 new sensor into the skin every 10 days    [provider]  DRYSOL 20 % external solution Apply 1 Application topically 2 (two) times a week. 12/16/23   [provider]  DULoxetine  (CYMBALTA ) 60 MG capsule Take 60 mg by mouth at bedtime. 06/28/22   [provider]  insulin  aspart (NOVOLOG ) 100 UNIT/ML FlexPen Inject 0-9 Units into the skin 3 (three) times daily with meals. CBG < 70: Eat or drink something sweet and recheck, CBG 70 - 120: 0 units CBG 121 - 150: 1 unit CBG 151 - 200: 2 units CBG 201 -  250: 3 units CBG 251 - 300: 5 units CBG 301 - 350: 7 units CBG 351 - 400: 9 units CBG > 400: call MD. Patient taking differently: Inject 0-9 Units into the skin See admin instructions. Inject 0-9 units into the skin 3 times a day with meals, PER SLIDING SCALE: CBG < 70: Eat or drink something sweet and recheck, CBG 70 - 120: 0 units CBG 121 - 150: 1 unit CBG 151 - 200: 2 units CBG 201 - 250: 3 units CBG 251 - 300: 5 units CBG 301 - 350: 7 units CBG 351 - 400: 9 units CBG > 400: call MD 08/07/23 01/18/24  [name], MD  insulin  glargine (LANTUS ) 100 UNIT/ML Solostar Pen Inject 12 Units into the skin daily. May substitute as needed per insurance. 10/15/23 01/18/24  [place], [name], DO  Insulin  Pen Needle (B-D UF III MINI PEN NEEDLES) 31G X 5 MM MISC To use with Insulin  pens, as directed 08/07/23   Hongalgi, Anand D, MD  Insulin  Pen Needle (PEN NEEDLES) 31G X 5 MM MISC 1 each by Does not apply route 3 (three) times daily. May dispense any manufacturer covered by patient's insurance. 10/15/23   [place], Camellia PARAS, DO  metoCLOPramide  (REGLAN ) 10 MG tablet Take 1 tablet (10 mg total) by mouth every 6 (six) hours as needed for nausea or vomiting. 01/17/24   Jerral Meth, MD  oxyCODONE  (ROXICODONE )  15 MG immediate release tablet Take 15 mg by mouth 4 (four) times daily.    [provider]  prochlorperazine  (COMPAZINE ) 25 MG suppository Place 1 suppository (25 mg total) rectally every 12 (twelve) hours as needed for refractory nausea / vomiting. 01/19/24   Vann, Jessica U, DO  promethazine  (PHENERGAN ) 25 MG suppository Place 1 suppository (25 mg total) rectally every 6 (six) hours as needed for nausea or vomiting. 01/17/24   Jerral Meth, MD    Allergies: Mirapex [pramipexole]    Review of Systems  Constitutional:  Negative for fever.  Respiratory:  Negative for shortness of breath.   Cardiovascular:  Negative for chest pain.  Gastrointestinal:  Positive for abdominal pain, nausea and  vomiting.  All other systems reviewed and are negative.   Updated Vital Signs BP (!) 165/98   Pulse (!) 113   Temp 98.1 F (36.7 C) (Oral)   Resp 19   Ht 1.676 m (5' 6)   Wt 74.8 kg   LMP 02/16/2024   SpO2 99%   BMI 26.63 kg/m   Physical Exam Vitals and nursing note reviewed.  Constitutional:      Appearance: She is well-developed.  HENT:     Head: Normocephalic and atraumatic.     Left Ear: There is no impacted cerumen.     Mouth/Throat:     Mouth: Mucous membranes are dry.  Eyes:     Pupils: Pupils are equal, round, and reactive to light.  Cardiovascular:     Rate and Rhythm: Regular rhythm. Tachycardia present.     Heart sounds: Normal heart sounds.  Pulmonary:     Effort: Pulmonary effort is normal. No respiratory distress.     Breath sounds: No wheezing.  Abdominal:     General: Bowel sounds are normal.     Palpations: Abdomen is soft.     Tenderness: There is no abdominal tenderness. There is no guarding or rebound.  Musculoskeletal:     Cervical back: Neck supple.  Skin:    General: Skin is warm and dry.  Neurological:     Mental Status: She is alert and oriented to person, place, and time.  Psychiatric:        Mood and Affect: Mood normal.     (all labs ordered are listed, but only abnormal results are displayed) Labs Reviewed  CBC WITH DIFFERENTIAL/PLATELET - Abnormal; Notable for the following components:      Result Value   WBC 10.6 (*)    Neutro Abs 8.5 (*)    All other components within normal limits  COMPREHENSIVE METABOLIC PANEL WITH GFR - Abnormal; Notable for the following components:   Glucose, Bld 338 (*)    ALT 80 (*)    Alkaline Phosphatase 147 (*)    All other components within normal limits  URINALYSIS, ROUTINE W REFLEX MICROSCOPIC - Abnormal; Notable for the following components:   Glucose, UA >1,000 (*)    Hgb urine dipstick MODERATE (*)    Protein, ur 100 (*)    Bacteria, UA RARE (*)    All other components within normal  limits  LACTIC ACID, PLASMA - Abnormal; Notable for the following components:   Lactic Acid, Venous 2.8 (*)    All other components within normal limits  LACTIC ACID, PLASMA - Abnormal; Notable for the following components:   Lactic Acid, Venous 2.4 (*)    All other components within normal limits  CBG MONITORING, ED - Abnormal; Notable for the following components:  Glucose-Capillary 360 (*)    All other components within normal limits  I-STAT VENOUS BLOOD GAS, ED - Abnormal; Notable for the following components:   pH, Ven 7.221 (*)    pCO2, Ven 41.0 (*)    pO2, Ven 27 (*)    Bicarbonate 16.9 (*)    TCO2 18 (*)    Acid-base deficit 10.0 (*)    Sodium 103 (*)    Potassium 3.4 (*)    Calcium , Ion 0.99 (*)    All other components within normal limits  I-STAT VENOUS BLOOD GAS, ED - Abnormal; Notable for the following components:   pCO2, Ven 37.1 (*)    pO2, Ven 30 (*)    Sodium 134 (*)    All other components within normal limits  BETA-HYDROXYBUTYRIC ACID  LIPASE, BLOOD  PREGNANCY, URINE    EKG: EKG Interpretation Date/Time:  Sunday February 22 2024 00:08:40 EDT Ventricular Rate:  115 PR Interval:  156 QRS Duration:  73 QT Interval:  334 QTC Calculation: 462 R Axis:   81  Text Interpretation: Sinus tachycardia Confirmed by Bari Pfeiffer ([id number]) on 02/22/2024 1:36:31 AM  Radiology: CT ABDOMEN PELVIS W CONTRAST Result Date: 02/22/2024 CLINICAL DATA:  Abdominal pain, acute, nonlocalized EXAM: CT ABDOMEN AND PELVIS WITH CONTRAST TECHNIQUE: Multidetector CT imaging of the abdomen and pelvis was performed using the standard protocol following bolus administration of intravenous contrast. RADIATION DOSE REDUCTION: This exam was performed according to the departmental dose-optimization program which includes automated exposure control, adjustment of the mA and/or kV according to patient size and/or use of iterative reconstruction technique. CONTRAST:  OMNIPAQUE  IOHEXOL  300 MG/ML   SOLN COMPARISON:  CT abdomen pelvis 12/09/2023 FINDINGS: Lower chest: No acute abnormality. Hepatobiliary: No focal liver abnormality. Calcified gallstone noted within the gallbladder lumen. No gallbladder wall thickening or pericholecystic fluid. No biliary dilatation. Pancreas: No focal lesion. Normal pancreatic contour. No surrounding inflammatory changes. No main pancreatic ductal dilatation. Spleen: Normal in size without focal abnormality. Adrenals/Urinary Tract: No adrenal nodule bilaterally. Bilateral kidneys enhance symmetrically. No hydronephrosis. No hydroureter. The urinary bladder is unremarkable. Stomach/Bowel: Stomach is within normal limits. No evidence of bowel wall thickening or dilatation. Appendix appears normal. Vascular/Lymphatic: No abdominal aorta or iliac aneurysm. Mild atherosclerotic plaque of the aorta and its branches. No abdominal, pelvic, or inguinal lymphadenopathy. Reproductive: Uterus and bilateral adnexa are unremarkable. Other: No intraperitoneal free fluid. No intraperitoneal free gas. No organized fluid collection. Musculoskeletal: No abdominal wall hernia or abnormality. No suspicious lytic or blastic osseous lesions. No acute displaced fracture. IMPRESSION: Cholelithiasis with no acute cholecystitis. Electronically Signed   By: Morgane  Naveau M.D.   On: 02/22/2024 02:35     .Critical Care  Performed by: Bari Pfeiffer FALCON, MD Authorized by: Bari Pfeiffer FALCON, MD   Critical care provider statement:    Critical care time (minutes):  60   Critical care was necessary to treat or prevent imminent or life-threatening deterioration of the following conditions:  Dehydration (Ultibro IV medications, fluids, multiple rechecks)   Critical care was time spent personally by me on the following activities:  Development of treatment plan with patient or surrogate, discussions with consultants, evaluation of patient's response to treatment, examination of patient, ordering and  review of laboratory studies, ordering and review of radiographic studies, ordering and performing treatments and interventions, pulse oximetry, re-evaluation of patient's condition and review of old charts    Medications Ordered in the ED  sodium chloride  0.9 % bolus 1,000 mL (0 mLs Intravenous  Stopped 02/22/24 0053)  ondansetron  (ZOFRAN ) injection 4 mg (4 mg Intravenous Given 02/22/24 0012)  iohexol  (OMNIPAQUE ) 300 MG/ML solution 100 mL (100 mLs Intravenous Contrast Given 02/22/24 0212)  droperidol  (INAPSINE ) 2.5 MG/ML injection 1.25 mg (1.25 mg Intravenous Given 02/22/24 0141)  sodium chloride  0.9 % bolus 1,000 mL (0 mLs Intravenous Stopped 02/22/24 0403)  LORazepam  (ATIVAN ) injection 1 mg (1 mg Intravenous Given 02/22/24 0319)  prochlorperazine  (COMPAZINE ) injection 10 mg (10 mg Intravenous Given 02/22/24 0317)  diphenhydrAMINE  (BENADRYL ) injection 25 mg (25 mg Intravenous Given 02/22/24 0315)  sodium chloride  0.9 % bolus 1,000 mL (1,000 mLs Intravenous New Bag/Given 02/22/24 0503)  oxyCODONE -acetaminophen  (PERCOCET/ROXICET) 5-325 MG per tablet 2 tablet (2 tablets Oral Given 02/22/24 0503)    Clinical Course as of 02/22/24 0559  Sun Feb 22, 2024  0314 Patient requesting narcotic pain medication.  Do not feel it is indicated.  Would likely worsen gastroparesis.  She is on chronic narcotics so 1 consideration would be narcotic withdrawal although she is not having significant diarrhea.  Will provide with Ativan  and Compazine .  Repeat VBG ordered as sodium was artificially low at 103. [CH]  [id number] Patient is tolerating sips.  States that she has been taking her pain medication at home and does not feel like she has any component of withdrawal.  Will give 1 additional liter of fluids.  Heart rate steadily improving. [CH]  0531 Patient resting comfortably.  No acute distress.  Heart rate 90s. [CH]    Clinical Course User Index [CH] Emunah Texidor, [name], MD                                 Medical Decision  Making Amount and/or Complexity of Data Reviewed Labs: ordered. Radiology: ordered.  Risk Prescription drug management.   This patient presents to the ED for concern of nausea and vomiting, this involves an extensive number of treatment options, and is a complaint that carries with it a high risk of complications and morbidity.  I considered the following differential and admission for this acute, potentially life threatening condition.  The differential diagnosis includes DKA, gastroparesis, cannabinoid hyperemesis, dehydration, SBO, intra-abdominal process  MDM:    This is a 38 year old female who presents with nausea and vomiting.  History of the same.  Recurrent ED visits and admissions.  Likely multifactorial including type 1 diabetes, gastroparesis, marijuana use.  Patient was given fluids and nausea medication.  Discussed with patient that I was avoiding narcotic pain medication as this would likely exacerbate her gastroparesis.  She does not have any point tenderness on her abdominal exam which is fairly benign.  Labs with hyperglycemia without evidence of DKA.  She does have slightly elevated ALT and alkaline phosphatase.  Beta hydroxybutyrate is normal.  Anion gap is normal.  Initial VBG indicated acidosis.  However multiple readings appeared off including a sodium of 103 which was not corresponding to her CMP.  Recheck reveals normal pH.  Suspect the initial VBG was an error.  Lactate was 2.8.  It improved to 2.4.  Patient had multiple liters of fluids with progressive improvement of her heart rate.  She received multiple antiemetic medications.  She did request IV narcotic pain medication multiple times; however I discussed with her that given the absence of other intra-abdominal pathology, did not feel that this was indicated.  CT just shows gallstones.  She was able to tolerate sips of fluid and on last  recheck was resting comfortably.  Heart rate improved.  Plan for discharge.  Given  Phenergan  suppositories.  Encouraged to stop smoking.  (Labs, imaging, consults)  Labs: I Ordered, and personally interpreted labs.  The pertinent results include: CBC, CMP, lipase, VBG, beta hydroxybutyrate, urine pregnancy, urinalysis  Imaging Studies ordered: I ordered imaging studies including CT I independently visualized and interpreted imaging. I agree with the radiologist interpretation  Additional history obtained from chart review.  External records from outside source obtained and reviewed including prior admissions  Cardiac Monitoring: The patient was maintained on a cardiac monitor.  If on the cardiac monitor, I personally viewed and interpreted the cardiac monitored which showed an underlying rhythm of: Sinus  Reevaluation: After the interventions noted above, I reevaluated the patient and found that they have :improved  Social Determinants of Health:  lives independently  Disposition: Discharge  Co morbidities that complicate the patient evaluation  Past Medical History:  Diagnosis Date   Chronic constipation 11/08/2021   Diabetic neuropathy (HCC)    History of chicken pox    History of noncompliance with medical treatment 09/07/2022   Type 1 diabetes (HCC)    Vaginal odor 11/08/2021     Medicines Meds ordered this encounter  Medications   sodium chloride  0.9 % bolus 1,000 mL   ondansetron  (ZOFRAN ) injection 4 mg   iohexol  (OMNIPAQUE ) 300 MG/ML solution 100 mL   droperidol  (INAPSINE ) 2.5 MG/ML injection 1.25 mg   sodium chloride  0.9 % bolus 1,000 mL   LORazepam  (ATIVAN ) injection 1 mg   prochlorperazine  (COMPAZINE ) injection 10 mg   diphenhydrAMINE  (BENADRYL ) injection 25 mg   sodium chloride  0.9 % bolus 1,000 mL   oxyCODONE -acetaminophen  (PERCOCET/ROXICET) 5-325 MG per tablet 2 tablet    Refill:  0   promethazine  (PHENERGAN ) 25 MG suppository    Sig: Place 1 suppository (25 mg total) rectally every 6 (six) hours as needed for nausea or vomiting.     Dispense:  12 each    Refill:  0    I have reviewed the patients home medicines and have made adjustments as needed  Problem List / ED Course: Problem List Items Addressed This Visit       Digestive   Nausea and vomiting - Primary   Other Visit Diagnoses       Dehydration         Hyperglycemia                    Final diagnoses:  Nausea and vomiting, unspecified vomiting type  Dehydration  Hyperglycemia    ED Discharge Orders          Ordered    promethazine  (PHENERGAN ) 25 MG suppository  Every 6 hours PRN        02/22/24 0558               Bari [name], MD 02/22/24 [phone]

## 2024-02-22 NOTE — Discharge Instructions (Signed)
 You were seen today for recurrent nausea and vomiting.  This is likely multifactorial and related to your gastroparesis.  If you are continuing to smoke marijuana this can also contribute.  Make sure that you are taking medications as prescribed.  Small frequent meals and fluids will be important.  Use Phenergan  suppositories as needed.  At this time there is no evidence of DKA.

## 2024-02-23 ENCOUNTER — Other Ambulatory Visit: Payer: Self-pay

## 2024-02-23 ENCOUNTER — Emergency Department (HOSPITAL_BASED_OUTPATIENT_CLINIC_OR_DEPARTMENT_OTHER)
Admission: EM | Admit: 2024-02-23 | Discharge: 2024-02-23 | Disposition: A | Discharge status: Home / Self Care | Attending: Emergency Medicine | Admitting: Emergency Medicine

## 2024-02-23 ENCOUNTER — Encounter (HOSPITAL_BASED_OUTPATIENT_CLINIC_OR_DEPARTMENT_OTHER): Payer: Self-pay | Admitting: Emergency Medicine

## 2024-02-23 DIAGNOSIS — G8929 Other chronic pain: Secondary | ICD-10-CM | POA: Insufficient documentation

## 2024-02-23 DIAGNOSIS — R000 Tachycardia, unspecified: Secondary | ICD-10-CM | POA: Insufficient documentation

## 2024-02-23 DIAGNOSIS — R0682 Tachypnea, not elsewhere classified: Secondary | ICD-10-CM | POA: Insufficient documentation

## 2024-02-23 DIAGNOSIS — Z794 Long term (current) use of insulin: Secondary | ICD-10-CM | POA: Diagnosis not present

## 2024-02-23 DIAGNOSIS — E876 Hypokalemia: Secondary | ICD-10-CM | POA: Diagnosis not present

## 2024-02-23 DIAGNOSIS — L97519 Non-pressure chronic ulcer of other part of right foot with unspecified severity: Secondary | ICD-10-CM | POA: Insufficient documentation

## 2024-02-23 DIAGNOSIS — E13621 Other specified diabetes mellitus with foot ulcer: Secondary | ICD-10-CM | POA: Diagnosis not present

## 2024-02-23 DIAGNOSIS — M791 Myalgia, unspecified site: Secondary | ICD-10-CM | POA: Diagnosis present

## 2024-02-23 LAB — COMPREHENSIVE METABOLIC PANEL WITH GFR
ALT: 40 U/L (ref 0–44)
AST: 18 U/L (ref 15–41)
Albumin: 4.4 g/dL (ref 3.5–5.0)
Alkaline Phosphatase: 125 U/L (ref 38–126)
Anion gap: 12 (ref 5–15)
BUN: 10 mg/dL (ref 6–20)
CO2: 30 mmol/L (ref 22–32)
Calcium: 10 mg/dL (ref 8.9–10.3)
Chloride: 94 mmol/L — ABNORMAL LOW (ref 98–111)
Creatinine, Ser: 0.72 mg/dL (ref 0.44–1.00)
GFR, Estimated: >60 mL/min (ref >60)
Glucose, Bld: 253 mg/dL — ABNORMAL HIGH (ref 70–99)
Potassium: 3.3 mmol/L — ABNORMAL LOW (ref 3.5–5.1)
Sodium: 136 mmol/L (ref 135–145)
Total Bilirubin: 0.5 mg/dL (ref 0.0–1.2)
Total Protein: 8.1 g/dL (ref 6.5–8.1)

## 2024-02-23 LAB — CBC WITH DIFFERENTIAL/PLATELET
Abs Immature Granulocytes: 0.03 K/uL (ref 0.00–0.07)
Basophils Absolute: 0 K/uL (ref 0.0–0.1)
Basophils Relative: 0 %
Eosinophils Absolute: 0 K/uL (ref 0.0–0.5)
Eosinophils Relative: 0 %
HCT: 39.6 % (ref 36.0–46.0)
Hemoglobin: 13.8 g/dL (ref 12.0–15.0)
Immature Granulocytes: 0 %
Lymphocytes Relative: 19 %
Lymphs Abs: 1.7 K/uL (ref 0.7–4.0)
MCH: 30.5 pg (ref 26.0–34.0)
MCHC: 34.8 g/dL (ref 30.0–36.0)
MCV: 87.4 fL (ref 80.0–100.0)
Monocytes Absolute: 0.6 K/uL (ref 0.1–1.0)
Monocytes Relative: 7 %
Neutro Abs: 6.7 K/uL (ref 1.7–7.7)
Neutrophils Relative %: 74 %
Platelets: 357 K/uL (ref 150–400)
RBC: 4.53 MIL/uL (ref 3.87–5.11)
RDW: 13.8 % (ref 11.5–15.5)
WBC: 9.1 K/uL (ref 4.0–10.5)
nRBC: 0 % (ref 0.0–0.2)

## 2024-02-23 MED ORDER — KETOROLAC TROMETHAMINE 15 MG/ML IJ SOLN
15.0000 mg | Freq: Once | INTRAMUSCULAR | Status: AC
  Administered 2024-02-23: 15 mg via INTRAVENOUS
  Filled 2024-02-23 (×2): qty 1

## 2024-02-23 MED ORDER — POTASSIUM CHLORIDE CRYS ER 20 MEQ PO TBCR
40.0000 meq | EXTENDED_RELEASE_TABLET | Freq: Once | ORAL | Status: AC
  Administered 2024-02-23: 40 meq via ORAL
  Filled 2024-02-23: qty 2

## 2024-02-23 MED ORDER — SODIUM CHLORIDE 0.9 % IV BOLUS
1000.0000 mL | Freq: Once | INTRAVENOUS | Status: AC
  Administered 2024-02-23: 1000 mL via INTRAVENOUS

## 2024-02-23 MED ORDER — METOCLOPRAMIDE HCL 5 MG/ML IJ SOLN
10.0000 mg | Freq: Once | INTRAMUSCULAR | Status: AC
  Administered 2024-02-23: 10 mg via INTRAVENOUS
  Filled 2024-02-23: qty 2

## 2024-02-23 NOTE — ED Provider Notes (Signed)
 [place] EMERGENCY DEPARTMENT AT [facility] Provider Note   CSN: [id number] Arrival date & time: 02/23/24  1217     Patient presents with: Generalized Body Aches   [name] is a 38 y.o. female.   38 year old female presents the emergency room with complaint of severe pain from her neuropathy which is described as involving her entire body with complaint of nausea and 1 episode of vomiting.  When questioned specifically on where she has neuropathy, she states it is her whole body but believes to be in her feet and moving into her whole body.  She denies fevers, chills, any recent falls or injuries.  Patient was seen in this emergency room yesterday with severe vomiting and pain.  Patient is taking her oxycodone  as prescribed by her provider, states she was told to come to the ER because this was not helping with her pain.       Prior to Admission medications   Medication Sig Start Date End Date Taking? Authorizing Provider  NOVOLOG  PENFILL cartridge PLEASE SEE ATTACHED FOR DETAILED DIRECTIONS 01/26/24  Yes [provider]  Oxycodone  HCl 10 MG TABS Take 10 mg by mouth 4 (four) times daily. 02/08/24  Yes [provider]  Acetaminophen  (MIDOL  PO) Take 1 tablet by mouth daily as needed (cramping).    [provider]  capsicum (ZOSTRIX) 0.075 % topical cream Apply topically 2 (two) times daily. To abdomen 01/19/24   Juvenal Raisin U, DO  Continuous Blood Gluc Transmit (DEXCOM G6 TRANSMITTER) MISC Inject 1 each into the skin every 14 (fourteen) days. 12/31/21   Lucius Krabbe, NP  Continuous Glucose Sensor (DEXCOM G7 SENSOR) MISC Inject 1 Device into the skin See admin instructions. Place 1 new sensor into the skin every 10 days    [provider]  DRYSOL 20 % external solution Apply 1 Application topically 2 (two) times a week. 12/16/23   [provider]  DULoxetine  (CYMBALTA ) 60 MG capsule Take 60 mg by mouth at bedtime.  06/28/22   [provider]  insulin  aspart (NOVOLOG ) 100 UNIT/ML FlexPen Inject 0-9 Units into the skin 3 (three) times daily with meals. CBG < 70: Eat or drink something sweet and recheck, CBG 70 - 120: 0 units CBG 121 - 150: 1 unit CBG 151 - 200: 2 units CBG 201 - 250: 3 units CBG 251 - 300: 5 units CBG 301 - 350: 7 units CBG 351 - 400: 9 units CBG > 400: call MD. Patient taking differently: Inject 0-9 Units into the skin See admin instructions. Inject 0-9 units into the skin 3 times a day with meals, PER SLIDING SCALE: CBG < 70: Eat or drink something sweet and recheck, CBG 70 - 120: 0 units CBG 121 - 150: 1 unit CBG 151 - 200: 2 units CBG 201 - 250: 3 units CBG 251 - 300: 5 units CBG 301 - 350: 7 units CBG 351 - 400: 9 units CBG > 400: call MD 08/07/23 01/18/24  [name], MD  insulin  glargine (LANTUS ) 100 UNIT/ML Solostar Pen Inject 12 Units into the skin daily. May substitute as needed per insurance. 10/15/23 01/18/24  [place], [name], DO  Insulin  Pen Needle (B-D UF III MINI PEN NEEDLES) 31G X 5 MM MISC To use with Insulin  pens, as directed 08/07/23   Hongalgi, Trenda BIRCH, MD  Insulin  Pen Needle (PEN NEEDLES) 31G X 5 MM MISC 1 each by Does not apply route 3 (three) times daily. May  dispense any manufacturer covered by patient's insurance. 10/15/23   [place], Camellia PARAS, DO  metoCLOPramide  (REGLAN ) 10 MG tablet Take 1 tablet (10 mg total) by mouth every 6 (six) hours as needed for nausea or vomiting. 01/17/24   Jerral Meth, MD  oxyCODONE  (ROXICODONE ) 15 MG immediate release tablet Take 15 mg by mouth 4 (four) times daily.    [provider]  prochlorperazine  (COMPAZINE ) 25 MG suppository Place 1 suppository (25 mg total) rectally every 12 (twelve) hours as needed for refractory nausea / vomiting. 01/19/24   Vann, Jessica U, DO  promethazine  (PHENERGAN ) 25 MG suppository Place 1 suppository (25 mg total) rectally every 6 (six) hours as needed for nausea or vomiting. 01/17/24    Jerral Meth, MD  promethazine  (PHENERGAN ) 25 MG suppository Place 1 suppository (25 mg total) rectally every 6 (six) hours as needed for nausea or vomiting. 02/22/24   Horton, [name], MD    Allergies: Mirapex [pramipexole]    Review of Systems Negative except as per HPI Updated Vital Signs BP (!) 168/100 (BP Location: Right Arm)   Pulse (!) 115   Temp 99 F (37.2 C) (Oral)   Resp 19   LMP 02/16/2024   SpO2 100%   Physical Exam Vitals and nursing note reviewed.  Constitutional:      General: She is not in acute distress.    Appearance: She is well-developed. She is not diaphoretic.  HENT:     Head: Normocephalic and atraumatic.  Cardiovascular:     Rate and Rhythm: Regular rhythm. Tachycardia present.     Heart sounds: Normal heart sounds.  Pulmonary:     Effort: Pulmonary effort is normal. Tachypnea present.     Breath sounds: Normal breath sounds.     Comments: Hyperventilating  Musculoskeletal:        General: No swelling or tenderness.     Right lower leg: No edema.     Left lower leg: No edema.     Comments: Two rings in place on right 2nd toe with chronic skin changes noted, rings removed without difficulty. Callous to plantar surface of left foot great toe (see photo) Callous to plantar surface of right foot at 1st MTP and lateral aspect of foot (see photo)  Skin:    General: Skin is warm and dry.     Findings: No erythema.  Neurological:     Mental Status: She is alert and oriented to person, place, and time.  Psychiatric:        Behavior: Behavior normal.            (all labs ordered are listed, but only abnormal results are displayed) Labs Reviewed  COMPREHENSIVE METABOLIC PANEL WITH GFR - Abnormal; Notable for the following components:      Result Value   Potassium 3.3 (*)    Chloride 94 (*)    Glucose, Bld 253 (*)    All other components within normal limits  CBC WITH DIFFERENTIAL/PLATELET    EKG: None  Radiology: CT ABDOMEN  PELVIS W CONTRAST Result Date: 02/22/2024 CLINICAL DATA:  Abdominal pain, acute, nonlocalized EXAM: CT ABDOMEN AND PELVIS WITH CONTRAST TECHNIQUE: Multidetector CT imaging of the abdomen and pelvis was performed using the standard protocol following bolus administration of intravenous contrast. RADIATION DOSE REDUCTION: This exam was performed according to the departmental dose-optimization program which includes automated exposure control, adjustment of the mA and/or kV according to patient size and/or use of iterative reconstruction technique. CONTRAST:  OMNIPAQUE  IOHEXOL  300  MG/ML  SOLN COMPARISON:  CT abdomen pelvis 12/09/2023 FINDINGS: Lower chest: No acute abnormality. Hepatobiliary: No focal liver abnormality. Calcified gallstone noted within the gallbladder lumen. No gallbladder wall thickening or pericholecystic fluid. No biliary dilatation. Pancreas: No focal lesion. Normal pancreatic contour. No surrounding inflammatory changes. No main pancreatic ductal dilatation. Spleen: Normal in size without focal abnormality. Adrenals/Urinary Tract: No adrenal nodule bilaterally. Bilateral kidneys enhance symmetrically. No hydronephrosis. No hydroureter. The urinary bladder is unremarkable. Stomach/Bowel: Stomach is within normal limits. No evidence of bowel wall thickening or dilatation. Appendix appears normal. Vascular/Lymphatic: No abdominal aorta or iliac aneurysm. Mild atherosclerotic plaque of the aorta and its branches. No abdominal, pelvic, or inguinal lymphadenopathy. Reproductive: Uterus and bilateral adnexa are unremarkable. Other: No intraperitoneal free fluid. No intraperitoneal free gas. No organized fluid collection. Musculoskeletal: No abdominal wall hernia or abnormality. No suspicious lytic or blastic osseous lesions. No acute displaced fracture. IMPRESSION: Cholelithiasis with no acute cholecystitis. Electronically Signed   By: Morgane  Naveau M.D.   On: 02/22/2024 02:35     Procedures    Medications Ordered in the ED  ketorolac  (TORADOL ) 15 MG/ML injection 15 mg (15 mg Intravenous Patient Refused/Not Given 02/23/24 1510)  potassium chloride  SA (KLOR-CON  M) CR tablet 40 mEq (has no administration in time range)  metoCLOPramide  (REGLAN ) injection 10 mg (has no administration in time range)  sodium chloride  0.9 % bolus 1,000 mL (0 mLs Intravenous Stopped 02/23/24 1616)                                    Medical Decision Making Amount and/or Complexity of Data Reviewed Labs: ordered.  Risk Prescription drug management.   This patient presents to the ED for concern of diffuse body pain, this involves an extensive number of treatment options, and is a complaint that carries with it a high risk of complications and morbidity.  The differential diagnosis includes but not limited to metabolic/electrolyte disturbance, chronic pain, malingering   Co morbidities / Chronic conditions that complicate the patient evaluation  Diabetes, neuropathy, chronic pain syndrome, AKI, intractable vomiting, prolonged QT interval   Additional history obtained:  Additional history obtained from EMR External records from outside source obtained and reviewed including prior labs on file.  Prior visit to emergency room yesterday.   Lab Tests:  I Ordered, and personally interpreted labs.  The pertinent results include: CBC within normal notes.  CMP with mild hypokalemia at 3.3, glucose elevated at 253.    Cardiac Monitoring: / EKG:  The patient was maintained on a cardiac monitor.  I personally viewed and interpreted the cardiac monitored which showed an underlying rhythm of: EKG obtained yesterday in the emergency room evaluated, her QTc is 462.   Problem List / ED Course / Critical interventions / Medication management  38 year old female presents the emergency room with complaint of diffuse body pain which she states is stemming from her neuropathy in her feet.  She states that she  has tried every medication for neuropathy and none of this helps and requesting narcotic pain medication here today.  Informed patient narcotics are not indicated to treat neuropathy, if her 15 mg oxycodone  is not managing her pain, this does not seem appropriate.  Offered nonnarcotic medication which patient initially declined however eventually excepted IV Toradol .  Will provide Reglan  for her nausea.  Potassium for her mild hypokalemia.  Patient was just in this emergency room yesterday for  vomiting, labs were evaluated, her glucose is mildly elevated, not in DKA.  Patient is discharged to follow-up with her care team.  Did discuss the concerning findings of her feet specifically her right second toe.  The rings were removed from her toe, I advised against putting these back on her toe as they seem to be causing an injury to her toe which may result in loss of the toe if she does not seek further care.  Recommend follow-up with podiatry. I ordered medication including toradol , kdur, reglan    Reevaluation of the patient after these medicines showed that the patient remained stable I have reviewed the patients home medicines and have made adjustments as needed   Consultations Obtained:  I requested consultation with the ER attending, Dr. 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)  Hypokalemia    ED Discharge Orders     None          Beverley Leita LABOR, PA-C 02/23/24 1642    Kommor, Lum, MD 02/23/24 1650

## 2024-02-23 NOTE — ED Triage Notes (Signed)
 Reports pain all over from chronic neuropathy and troubles maintaining blood sugar level. Type 1. Unable to see pain clinic. Seen here for same on Saturday.

## 2024-02-23 NOTE — ED Notes (Signed)
 Reviewed discharge instructions and follow up care with pt. Pt verbalized understanding and had no further questions. Pt exited ED without complications.

## 2024-02-23 NOTE — Discharge Instructions (Signed)
 Follow up with your care team for management. Recommend seeing podiatry for your foot care. Your 2nd toe on your right foot is at risk for worsening of condition and loss of the toe.

## 2024-02-23 NOTE — ED Notes (Signed)
PA made aware of pt's BP.

## 2024-03-19 ENCOUNTER — Emergency Department (HOSPITAL_COMMUNITY): Admission: EM | Admit: 2024-03-19 | Discharge: 2024-03-20 | Disposition: A | Discharge status: Home / Self Care

## 2024-03-19 ENCOUNTER — Other Ambulatory Visit: Payer: Self-pay

## 2024-03-19 ENCOUNTER — Emergency Department (HOSPITAL_COMMUNITY)

## 2024-03-19 ENCOUNTER — Encounter (HOSPITAL_COMMUNITY): Payer: Self-pay | Admitting: Emergency Medicine

## 2024-03-19 DIAGNOSIS — R1084 Generalized abdominal pain: Secondary | ICD-10-CM

## 2024-03-19 DIAGNOSIS — E119 Type 2 diabetes mellitus without complications: Secondary | ICD-10-CM | POA: Diagnosis not present

## 2024-03-19 DIAGNOSIS — R1013 Epigastric pain: Secondary | ICD-10-CM | POA: Diagnosis not present

## 2024-03-19 DIAGNOSIS — R5381 Other malaise: Secondary | ICD-10-CM | POA: Diagnosis not present

## 2024-03-19 DIAGNOSIS — Z794 Long term (current) use of insulin: Secondary | ICD-10-CM | POA: Diagnosis not present

## 2024-03-19 DIAGNOSIS — R112 Nausea with vomiting, unspecified: Secondary | ICD-10-CM | POA: Diagnosis present

## 2024-03-19 LAB — COMPREHENSIVE METABOLIC PANEL WITH GFR
ALT: 21 U/L (ref 0–44)
AST: 23 U/L (ref 15–41)
Albumin: 4.4 g/dL (ref 3.5–5.0)
Alkaline Phosphatase: 104 U/L (ref 38–126)
Anion gap: 17 — ABNORMAL HIGH (ref 5–15)
BUN: 9 mg/dL (ref 6–20)
CO2: 23 mmol/L (ref 22–32)
Calcium: 10.6 mg/dL — ABNORMAL HIGH (ref 8.9–10.3)
Chloride: 100 mmol/L (ref 98–111)
Creatinine, Ser: 0.8 mg/dL (ref 0.44–1.00)
GFR, Estimated: >60 mL/min (ref >60)
Glucose, Bld: 272 mg/dL — ABNORMAL HIGH (ref 70–99)
Potassium: 3.7 mmol/L (ref 3.5–5.1)
Sodium: 140 mmol/L (ref 135–145)
Total Bilirubin: 0.8 mg/dL (ref 0.0–1.2)
Total Protein: 9.2 g/dL — ABNORMAL HIGH (ref 6.5–8.1)

## 2024-03-19 LAB — CBG MONITORING, ED: Glucose-Capillary: 247 mg/dL — ABNORMAL HIGH (ref 70–99)

## 2024-03-19 LAB — URINALYSIS, ROUTINE W REFLEX MICROSCOPIC
Bacteria, UA: NONE SEEN
Bilirubin Urine: NEGATIVE
Glucose, UA: 50 mg/dL — AB
Hgb urine dipstick: NEGATIVE
Ketones, ur: 20 mg/dL — AB
Nitrite: NEGATIVE
Protein, ur: >=300 mg/dL — AB
RBC / HPF: >50 RBC/hpf (ref 0–5)
Specific Gravity, Urine: 1.023 (ref 1.005–1.030)
pH: 8 (ref 5.0–8.0)

## 2024-03-19 LAB — CBC
HCT: 41 % (ref 36.0–46.0)
Hemoglobin: 13.5 g/dL (ref 12.0–15.0)
MCH: 30.1 pg (ref 26.0–34.0)
MCHC: 32.9 g/dL (ref 30.0–36.0)
MCV: 91.3 fL (ref 80.0–100.0)
Platelets: 356 K/uL (ref 150–400)
RBC: 4.49 MIL/uL (ref 3.87–5.11)
RDW: 14.2 % (ref 11.5–15.5)
WBC: 9.3 K/uL (ref 4.0–10.5)
nRBC: 0 % (ref 0.0–0.2)

## 2024-03-19 LAB — BLOOD GAS, VENOUS
Acid-Base Excess: 4.7 mmol/L — ABNORMAL HIGH (ref 0.0–2.0)
Bicarbonate: 29.2 mmol/L — ABNORMAL HIGH (ref 20.0–28.0)
O2 Saturation: 48.8 %
Patient temperature: 37
pCO2, Ven: 42 mmHg — ABNORMAL LOW (ref 44–60)
pH, Ven: 7.45 — ABNORMAL HIGH (ref 7.25–7.43)
pO2, Ven: <31 mmHg — CL (ref 32–45)

## 2024-03-19 LAB — HCG, SERUM, QUALITATIVE: Preg, Serum: NEGATIVE

## 2024-03-19 LAB — LIPASE, BLOOD: Lipase: 52 U/L — ABNORMAL HIGH (ref 11–51)

## 2024-03-19 MED ORDER — IOHEXOL 300 MG/ML  SOLN
100.0000 mL | Freq: Once | INTRAMUSCULAR | Status: AC | PRN
  Administered 2024-03-19: 100 mL via INTRAVENOUS

## 2024-03-19 MED ORDER — LACTATED RINGERS IV BOLUS
1000.0000 mL | Freq: Once | INTRAVENOUS | Status: AC
  Administered 2024-03-19: 1000 mL via INTRAVENOUS

## 2024-03-19 MED ORDER — DROPERIDOL 2.5 MG/ML IJ SOLN
1.2500 mg | Freq: Once | INTRAMUSCULAR | Status: AC
  Administered 2024-03-19: 1.25 mg via INTRAVENOUS
  Filled 2024-03-19: qty 2

## 2024-03-19 MED ORDER — HYDROMORPHONE HCL 1 MG/ML IJ SOLN
1.0000 mg | Freq: Once | INTRAMUSCULAR | Status: AC
  Administered 2024-03-19: 1 mg via INTRAVENOUS
  Filled 2024-03-19: qty 1

## 2024-03-19 NOTE — ED Triage Notes (Signed)
 Patient BIB EMS from home c/o abdominal pain x 1 day. Patient report nausea and vomiting all day today. Hx DM type 1 and Gastroparesis.

## 2024-03-19 NOTE — ED Provider Notes (Addendum)
 Care of patient received from prior provider at 10:55 PM, please see their note for complete H/P and care plan.  Received handoff per ED course.  Clinical Course as of 03/19/24 2255  [place] Mar 19, 2024  2046 Has had 3 negative CT abdomen scans since February of this year. [TY]  2254 Stable Acute on chronic abdominal pain Meds and CT pending.  [CC]    Clinical Course User Index [CC] Jerral Meth, MD [TY] Neysa Caron PARAS, DO    Reassessment: I was called back to patient's room because of ongoing nausea and vomiting.  All of her objective findings are negative.  When I walked in the room, patient was resting comfortably.  I told her that her studies were negative and that lab work was overall reassuring and after her IV fluid and antiemetics she would be stable for discharge. I walked out of the room before hearing a gagging sensation, I walked back in seeing her actively gagging herself appearing to make herself throw up. I informed the patient that this is likely the etiology of her ongoing nausea and vomiting.  Recommended she use her antiemetics as prescribed and follow-up with her normal gastroenterologist.     Jerral Meth, MD 03/20/24 0321  I was informed by nursing that patient is requesting IV narcotics for pain control. She has no focal pathology and this is likely more chronic pain.  I do not believe that further IV narcotics would be beneficial and there is no evidence that IV narcotics provide substantial relief in chronic abdominal pain.   Jerral Meth, MD 03/20/24 9676    Jerral Meth, MD 03/20/24 [phone]

## 2024-03-19 NOTE — ED Provider Notes (Signed)
 [place] EMERGENCY DEPARTMENT AT [facility] Provider Note   CSN: [id number] Arrival date & time: 03/19/24  8046     Patient presents with: Abdominal Pain   Ladell L McAdoo Collier is a 38 y.o. female.   Presented for abdominal pain, nausea vomiting and generalized malaise.  Symptoms started today.  Reports pain in epigastrium and too numerous to count episodes of nausea and vomiting.  Reportedly normal yesterday.  Similar to prior presentations for her abdominal pain/nausea vomiting   Abdominal Pain      Prior to Admission medications   Medication Sig Start Date End Date Taking? Authorizing Provider  Acetaminophen  (MIDOL  PO) Take 1 tablet by mouth daily as needed (cramping).    [provider]  capsicum (ZOSTRIX) 0.075 % topical cream Apply topically 2 (two) times daily. To abdomen 01/19/24   Juvenal Raisin U, DO  Continuous Blood Gluc Transmit (DEXCOM G6 TRANSMITTER) MISC Inject 1 each into the skin every 14 (fourteen) days. 12/31/21   Lucius Krabbe, NP  Continuous Glucose Sensor (DEXCOM G7 SENSOR) MISC Inject 1 Device into the skin See admin instructions. Place 1 new sensor into the skin every 10 days    [provider]  DRYSOL 20 % external solution Apply 1 Application topically 2 (two) times a week. 12/16/23   [provider]  DULoxetine  (CYMBALTA ) 60 MG capsule Take 60 mg by mouth at bedtime. 06/28/22   [provider]  insulin  aspart (NOVOLOG ) 100 UNIT/ML FlexPen Inject 0-9 Units into the skin 3 (three) times daily with meals. CBG < 70: Eat or drink something sweet and recheck, CBG 70 - 120: 0 units CBG 121 - 150: 1 unit CBG 151 - 200: 2 units CBG 201 - 250: 3 units CBG 251 - 300: 5 units CBG 301 - 350: 7 units CBG 351 - 400: 9 units CBG > 400: call MD. Patient taking differently: Inject 0-9 Units into the skin See admin instructions. Inject 0-9 units into the skin 3 times a day with meals, PER SLIDING SCALE: CBG < 70: Eat or  drink something sweet and recheck, CBG 70 - 120: 0 units CBG 121 - 150: 1 unit CBG 151 - 200: 2 units CBG 201 - 250: 3 units CBG 251 - 300: 5 units CBG 301 - 350: 7 units CBG 351 - 400: 9 units CBG > 400: call MD 08/07/23 01/18/24  [name], MD  insulin  glargine (LANTUS ) 100 UNIT/ML Solostar Pen Inject 12 Units into the skin daily. May substitute as needed per insurance. 10/15/23 01/18/24  [place], [name], DO  Insulin  Pen Needle (B-D UF III MINI PEN NEEDLES) 31G X 5 MM MISC To use with Insulin  pens, as directed 08/07/23   Hongalgi, Trenda BIRCH, MD  Insulin  Pen Needle (PEN NEEDLES) 31G X 5 MM MISC 1 each by Does not apply route 3 (three) times daily. May dispense any manufacturer covered by patient's insurance. 10/15/23   [place], Camellia PARAS, DO  metoCLOPramide  (REGLAN ) 10 MG tablet Take 1 tablet (10 mg total) by mouth every 6 (six) hours as needed for nausea or vomiting. 01/17/24   Jerral Meth, MD  NOVOLOG  PENFILL cartridge PLEASE SEE ATTACHED FOR DETAILED DIRECTIONS 01/26/24   [provider]  oxyCODONE  (ROXICODONE ) 15 MG immediate release tablet Take 15 mg by mouth 4 (four) times daily.    [provider]  Oxycodone  HCl 10 MG TABS Take 10 mg by mouth 4 (four) times daily. 02/08/24   [provider]  prochlorperazine  (COMPAZINE ) 25 MG suppository Place 1 suppository (25 mg total) rectally every 12 (twelve) hours as needed for refractory nausea / vomiting. 01/19/24   Vann, Jessica U, DO  promethazine  (PHENERGAN ) 25 MG suppository Place 1 suppository (25 mg total) rectally every 6 (six) hours as needed for nausea or vomiting. 01/17/24   Jerral Meth, MD  promethazine  (PHENERGAN ) 25 MG suppository Place 1 suppository (25 mg total) rectally every 6 (six) hours as needed for nausea or vomiting. 02/22/24   Horton, [name], MD    Allergies: Mirapex [pramipexole]    Review of Systems  Gastrointestinal:  Positive for abdominal pain.    Updated Vital Signs BP (!) 174/114 (BP  Location: Left Arm)   Pulse (!) 102   Temp 97.6 F (36.4 C) (Oral)   Resp 17   Ht 5' 6 (1.676 m)   Wt 74.8 kg   LMP 02/16/2024   SpO2 98%   BMI 26.62 kg/m   Physical Exam Vitals and nursing note reviewed.  Constitutional:      General: She is not in acute distress.    Appearance: She is not toxic-appearing.  HENT:     Head: Normocephalic.  Cardiovascular:     Rate and Rhythm: Normal rate and regular rhythm.  Pulmonary:     Effort: Pulmonary effort is normal.     Breath sounds: Normal breath sounds.  Abdominal:     General: Abdomen is flat.     Tenderness: There is abdominal tenderness (minor) in the epigastric area. There is no guarding or rebound.  Skin:    General: Skin is warm and dry.     Capillary Refill: Capillary refill takes less than 2 seconds.  Neurological:     Mental Status: She is alert and oriented to person, place, and time.  Psychiatric:        Mood and Affect: Mood normal.        Behavior: Behavior normal.     (all labs ordered are listed, but only abnormal results are displayed) Labs Reviewed  LIPASE, BLOOD - Abnormal; Notable for the following components:      Result Value   Lipase 52 (*)    All other components within normal limits  COMPREHENSIVE METABOLIC PANEL WITH GFR - Abnormal; Notable for the following components:   Glucose, Bld 272 (*)    Calcium  10.6 (*)    Total Protein 9.2 (*)    Anion gap 17 (*)    All other components within normal limits  URINALYSIS, ROUTINE W REFLEX MICROSCOPIC - Abnormal; Notable for the following components:   APPearance HAZY (*)    Glucose, UA 50 (*)    Ketones, ur 20 (*)    Protein, ur >=300 (*)    Leukocytes,Ua SMALL (*)    Non Squamous Epithelial 0-5 (*)    All other components within normal limits  BLOOD GAS, VENOUS - Abnormal; Notable for the following components:   pH, Ven 7.45 (*)    pCO2, Ven 42 (*)    pO2, Ven <31 (*)    Bicarbonate 29.2 (*)    Acid-Base Excess 4.7 (*)    All other  components within normal limits  CBG MONITORING, ED - Abnormal; Notable for the following components:   Glucose-Capillary 247 (*)    All other components within normal limits  CBC  HCG, SERUM, QUALITATIVE    EKG: EKG Interpretation Date/Time:  Friday March 19 2024 20:05:52 EDT Ventricular Rate:  115 PR Interval:  150 QRS Duration:  70 QT Interval:  311 QTC Calculation: 431 R Axis:   80  Text Interpretation: Sinus tachycardia Probable left atrial enlargement Borderline T wave abnormalities Confirmed by Neysa Clap [phone]) on 03/19/2024 8:47:50 PM  Radiology: No results found.   Procedures   Medications Ordered in the ED  droperidol  (INAPSINE ) 2.5 MG/ML injection 1.25 mg (1.25 mg Intravenous Given 03/19/24 2121)  lactated ringers  bolus 1,000 mL (0 mLs Intravenous Stopped 03/19/24 2235)  HYDROmorphone  (DILAUDID ) injection 1 mg (1 mg Intravenous Given 03/19/24 2235)    Clinical Course as of 03/19/24 2324  Fri Mar 19, 2024  2046 Has had 3 negative CT abdomen scans since February of this year. [TY]  2254 Stable Acute on chronic abdominal pain Meds and CT pending.  [CC]    Clinical Course User Index [CC] Jerral Meth, MD [TY] Neysa Clap PARAS, DO                                 Medical Decision Making Is a 38 year old female with diabetes, gastroparesis, chronic abdominal pain presenting the emergency department for abdominal pain nausea vomiting.  She is afebrile, slightly tachycardic and hypertensive.  Physical exam with some minor epigastric tenderness, but nonsurgical.  I suspect symptoms today related to her cyclical vomiting/gastroparesis and chronic abdominal pain rather than acute surgical process.  However patient has had persistent nausea vomiting despite droperidol  and IV fluids.  Does not appear to be in DKA given pH and bicarb levels.  No leukocytosis to suggest systemic infection.  Minor elevated lipase of 52, suspect reactionary to her nausea vomiting.  Patient  was treated with IV Dilaudid  for her severe pain.  Given her elevated lipase and persistent symptoms CT scan ordered.  Care signed out to Dr. Jerral pending results.  Amount and/or Complexity of Data Reviewed External Data Reviewed:     Details: See ED course Labs: ordered. Decision-making details documented in ED Course.    Details: See above Radiology: ordered. ECG/medicine tests:     Details: No QTc prolongation  Risk Prescription drug management. Parenteral controlled substances. Decision regarding hospitalization. Diagnosis or treatment significantly limited by social determinants of health. Risk Details: Poor health literacy      Final diagnoses:  None    ED Discharge Orders     None          Neysa Clap PARAS, DO 03/19/24 2324

## 2024-03-20 MED ORDER — ONDANSETRON HCL 4 MG/2ML IJ SOLN
4.0000 mg | Freq: Once | INTRAMUSCULAR | Status: AC
  Administered 2024-03-20: 4 mg via INTRAVENOUS
  Filled 2024-03-20: qty 2

## 2024-03-20 MED ORDER — ONDANSETRON HCL 4 MG PO TABS: 4.0000 mg | ORAL_TABLET | Freq: Four times a day (QID) | ORAL | 0 refills | Status: DC

## 2024-03-20 MED ORDER — METOCLOPRAMIDE HCL 5 MG/ML IJ SOLN
10.0000 mg | Freq: Once | INTRAMUSCULAR | Status: AC
  Administered 2024-03-20: 10 mg via INTRAVENOUS
  Filled 2024-03-20: qty 2

## 2024-03-21 ENCOUNTER — Emergency Department (HOSPITAL_COMMUNITY)

## 2024-03-21 ENCOUNTER — Other Ambulatory Visit: Payer: Self-pay

## 2024-03-21 ENCOUNTER — Encounter (HOSPITAL_COMMUNITY): Payer: Self-pay

## 2024-03-21 ENCOUNTER — Emergency Department (HOSPITAL_COMMUNITY)
Admission: EM | Admit: 2024-03-21 | Discharge: 2024-03-21 | Disposition: A | Discharge status: Home / Self Care | Attending: Emergency Medicine | Admitting: Emergency Medicine

## 2024-03-21 DIAGNOSIS — R1013 Epigastric pain: Secondary | ICD-10-CM | POA: Diagnosis not present

## 2024-03-21 DIAGNOSIS — R112 Nausea with vomiting, unspecified: Secondary | ICD-10-CM | POA: Insufficient documentation

## 2024-03-21 DIAGNOSIS — E1165 Type 2 diabetes mellitus with hyperglycemia: Secondary | ICD-10-CM | POA: Diagnosis not present

## 2024-03-21 DIAGNOSIS — R0789 Other chest pain: Secondary | ICD-10-CM | POA: Insufficient documentation

## 2024-03-21 DIAGNOSIS — Z794 Long term (current) use of insulin: Secondary | ICD-10-CM | POA: Diagnosis not present

## 2024-03-21 DIAGNOSIS — R739 Hyperglycemia, unspecified: Secondary | ICD-10-CM | POA: Diagnosis present

## 2024-03-21 DIAGNOSIS — D72829 Elevated white blood cell count, unspecified: Secondary | ICD-10-CM | POA: Diagnosis not present

## 2024-03-21 LAB — HCG, SERUM, QUALITATIVE: Preg, Serum: NEGATIVE

## 2024-03-21 LAB — I-STAT VENOUS BLOOD GAS, ED
Acid-Base Excess: 12 mmol/L — ABNORMAL HIGH (ref 0.0–2.0)
Bicarbonate: 36.4 mmol/L — ABNORMAL HIGH (ref 20.0–28.0)
Calcium, Ion: 1.12 mmol/L — ABNORMAL LOW (ref 1.15–1.40)
HCT: 40 % (ref 36.0–46.0)
Hemoglobin: 13.6 g/dL (ref 12.0–15.0)
O2 Saturation: 67 %
Potassium: 3.3 mmol/L — ABNORMAL LOW (ref 3.5–5.1)
Sodium: 130 mmol/L — ABNORMAL LOW (ref 135–145)
TCO2: 38 mmol/L — ABNORMAL HIGH (ref 22–32)
pCO2, Ven: 42.5 mmHg — ABNORMAL LOW (ref 44–60)
pH, Ven: 7.541 — ABNORMAL HIGH (ref 7.25–7.43)
pO2, Ven: 31 mmHg — CL (ref 32–45)

## 2024-03-21 LAB — COMPREHENSIVE METABOLIC PANEL WITH GFR
ALT: 15 U/L (ref 0–44)
AST: 18 U/L (ref 15–41)
Albumin: 3.6 g/dL (ref 3.5–5.0)
Alkaline Phosphatase: 86 U/L (ref 38–126)
Anion gap: 15 (ref 5–15)
BUN: 11 mg/dL (ref 6–20)
CO2: 31 mmol/L (ref 22–32)
Calcium: 9.9 mg/dL (ref 8.9–10.3)
Chloride: 86 mmol/L — ABNORMAL LOW (ref 98–111)
Creatinine, Ser: 0.98 mg/dL (ref 0.44–1.00)
GFR, Estimated: >60 mL/min (ref >60)
Glucose, Bld: 499 mg/dL — ABNORMAL HIGH (ref 70–99)
Potassium: 3.3 mmol/L — ABNORMAL LOW (ref 3.5–5.1)
Sodium: 132 mmol/L — ABNORMAL LOW (ref 135–145)
Total Bilirubin: 0.8 mg/dL (ref 0.0–1.2)
Total Protein: 7.4 g/dL (ref 6.5–8.1)

## 2024-03-21 LAB — CBC WITH DIFFERENTIAL/PLATELET
Abs Immature Granulocytes: 0.04 K/uL (ref 0.00–0.07)
Basophils Absolute: 0 K/uL (ref 0.0–0.1)
Basophils Relative: 0 %
Eosinophils Absolute: 0 K/uL (ref 0.0–0.5)
Eosinophils Relative: 0 %
HCT: 36.8 % (ref 36.0–46.0)
Hemoglobin: 12.8 g/dL (ref 12.0–15.0)
Immature Granulocytes: 0 %
Lymphocytes Relative: 18 %
Lymphs Abs: 2 K/uL (ref 0.7–4.0)
MCH: 30.6 pg (ref 26.0–34.0)
MCHC: 34.8 g/dL (ref 30.0–36.0)
MCV: 88 fL (ref 80.0–100.0)
Monocytes Absolute: 0.7 K/uL (ref 0.1–1.0)
Monocytes Relative: 6 %
Neutro Abs: 8.7 K/uL — ABNORMAL HIGH (ref 1.7–7.7)
Neutrophils Relative %: 76 %
Platelets: 349 K/uL (ref 150–400)
RBC: 4.18 MIL/uL (ref 3.87–5.11)
RDW: 14 % (ref 11.5–15.5)
WBC: 11.5 K/uL — ABNORMAL HIGH (ref 4.0–10.5)
nRBC: 0 % (ref 0.0–0.2)

## 2024-03-21 LAB — CBG MONITORING, ED
Glucose-Capillary: 467 mg/dL — ABNORMAL HIGH (ref 70–99)
Glucose-Capillary: 232 mg/dL — ABNORMAL HIGH (ref 70–99)
Glucose-Capillary: 183 mg/dL — ABNORMAL HIGH (ref 70–99)

## 2024-03-21 LAB — I-STAT CHEM 8, ED
BUN: 11 mg/dL (ref 6–20)
Calcium, Ion: 1.14 mmol/L — ABNORMAL LOW (ref 1.15–1.40)
Chloride: 85 mmol/L — ABNORMAL LOW (ref 98–111)
Creatinine, Ser: 0.9 mg/dL (ref 0.44–1.00)
Glucose, Bld: 508 mg/dL (ref 70–99)
HCT: 42 % (ref 36.0–46.0)
Hemoglobin: 14.3 g/dL (ref 12.0–15.0)
Potassium: 3.4 mmol/L — ABNORMAL LOW (ref 3.5–5.1)
Sodium: 130 mmol/L — ABNORMAL LOW (ref 135–145)
TCO2: 32 mmol/L (ref 22–32)

## 2024-03-21 LAB — LIPASE, BLOOD: Lipase: 80 U/L — ABNORMAL HIGH (ref 11–51)

## 2024-03-21 LAB — MAGNESIUM: Magnesium: 1.7 mg/dL (ref 1.7–2.4)

## 2024-03-21 LAB — BETA-HYDROXYBUTYRIC ACID: Beta-Hydroxybutyric Acid: 0.22 mmol/L (ref 0.05–0.27)

## 2024-03-21 MED ORDER — HYDROMORPHONE HCL 1 MG/ML IJ SOLN
0.5000 mg | Freq: Once | INTRAMUSCULAR | Status: AC
  Administered 2024-03-21: 0.5 mg via INTRAVENOUS
  Filled 2024-03-21: qty 1

## 2024-03-21 MED ORDER — SODIUM CHLORIDE 0.9 % IV BOLUS
1000.0000 mL | Freq: Once | INTRAVENOUS | Status: AC
  Administered 2024-03-21: 1000 mL via INTRAVENOUS

## 2024-03-21 MED ORDER — INSULIN ASPART 100 UNIT/ML IJ SOLN
10.0000 [IU] | Freq: Once | INTRAMUSCULAR | Status: AC
  Administered 2024-03-21: 10 [IU] via INTRAVENOUS

## 2024-03-21 MED ORDER — DROPERIDOL 2.5 MG/ML IJ SOLN
1.2500 mg | Freq: Once | INTRAMUSCULAR | Status: AC
  Administered 2024-03-21: 1.25 mg via INTRAVENOUS
  Filled 2024-03-21: qty 2

## 2024-03-21 NOTE — Discharge Instructions (Signed)
 Your laboratories also within normal limits today.  Please continue to hydrate with plenty of liquids at home.  You may return to the emergency department if you experience any worsening symptoms.

## 2024-03-21 NOTE — ED Provider Notes (Signed)
  Physical Exam  BP 118/88   Pulse 96   Temp 98.4 F (36.9 C) (Oral)   Resp 18   Ht 5' 6 (1.676 m)   Wt 74.8 kg   LMP 03/21/2024   SpO2 100%   BMI 26.62 kg/m   Physical Exam Vitals and nursing note reviewed.  Constitutional:      Appearance: Normal appearance.  HENT:     Mouth/Throat:     Mouth: Mucous membranes are moist.  Pulmonary:     Effort: Pulmonary effort is normal.  Abdominal:     General: Abdomen is flat.  Musculoskeletal:     Cervical back: Normal range of motion.  Skin:    General: Skin is warm and dry.  Neurological:     Mental Status: She is alert and oriented to person, place, and time.     Procedures  Procedures  ED Course / MDM   Clinical Course as of 03/21/24 0944  [facility] Mar 21, 2024  0517 Dr.  [BH]  9160 Glucose(!): 499 [JS]    Clinical Course User Index [BH] Henderly, Britni A, PA-C [JS] Tedford Berg, PA-C   Medical Decision Making Amount and/or Complexity of Data Reviewed Labs: ordered. Decision-making details documented in ED Course. Radiology: ordered.  Risk Prescription drug management.   Patient care assumed from [place] H.  APP at shift change, please see her note for full HPI.  Briefly, patient here with nausea vomiting which began last night.  Chronic abdominal pain and gastroparesis with a type I diabetic.  Gave herself 20 units of insulin  as she was hyperglycemic at home.  Multiple visits for the same.  Plan is for pending x-ray as she recently had a scan on August 1, pain meds along with follow-up on urinalysis.  X-ray of her abdomen showed: IMPRESSION:  1. Normal bowel gas pattern, no free air.  2. No cardiopulmonary abnormality.   Patient is tolerating p.o., has been sleeping but was able to tolerate fluids.  We discussed appropriate follow-up and keep managing her sugars at home.  Hemodynamically stable for discharge.   Portions of this note were generated with Scientist, clinical (histocompatibility and immunogenetics). Dictation errors may occur  despite best attempts at proofreading.         Alezander Dimaano, PA-C 03/21/24 0944    Griselda Norris, MD 03/22/24 [phone]

## 2024-03-21 NOTE — ED Provider Notes (Signed)
 [place] [facility] Provider Note   CSN: [id number] Arrival date & time: 03/21/24  9546    Patient presents with: Hyperglycemia   [name] is a 38 y.o. female history of type 1 diabetes, gastroparesis, chronic abdominal pain, prolonged QT interval here for evaluation abdominal pain, nausea and vomiting.  Patient states she was seen in the ED on 8/1.  She subsequently discharged home and continued to have multiple episodes of NBNB emesis and abdominal pain throughout the weekend.  She checked her blood sugar at home which was greater than 500s.  She gave herself 20 units of insulin .  When EMS arrived her blood sugar read high.  Continue to have vomiting.  Abdominal pain located epigastric region.  No bloody emesis, bright red blood or melanotic stools.  No chest pain.  Has felt short of breath since she started vomiting.  No chest pain.  No pain or swelling to lower legs.  No history of PE or DVT.  No recent surgery, immobilization, malignancy   I reviewed her most recent visits and admission.  Frequently request IV narcotic pain medicine.   HPI     Prior to Admission medications   Medication Sig Start Date End Date Taking? Authorizing Provider  Acetaminophen  (MIDOL  PO) Take 1 tablet by mouth daily as needed (cramping).    [provider]  capsicum (ZOSTRIX) 0.075 % topical cream Apply topically 2 (two) times daily. To abdomen 01/19/24   Juvenal Raisin U, DO  Continuous Blood Gluc Transmit (DEXCOM G6 TRANSMITTER) MISC Inject 1 each into the skin every 14 (fourteen) days. 12/31/21   Lucius Krabbe, NP  Continuous Glucose Sensor (DEXCOM G7 SENSOR) MISC Inject 1 Device into the skin See admin instructions. Place 1 new sensor into the skin every 10 days    [provider]  DRYSOL 20 % external solution Apply 1 Application topically 2 (two) times a week. 12/16/23   [provider]  DULoxetine  (CYMBALTA ) 60 MG capsule  Take 60 mg by mouth at bedtime. 06/28/22   [provider]  insulin  aspart (NOVOLOG ) 100 UNIT/ML FlexPen Inject 0-9 Units into the skin 3 (three) times daily with meals. CBG < 70: Eat or drink something sweet and recheck, CBG 70 - 120: 0 units CBG 121 - 150: 1 unit CBG 151 - 200: 2 units CBG 201 - 250: 3 units CBG 251 - 300: 5 units CBG 301 - 350: 7 units CBG 351 - 400: 9 units CBG > 400: call MD. Patient taking differently: Inject 0-9 Units into the skin See admin instructions. Inject 0-9 units into the skin 3 times a day with meals, PER SLIDING SCALE: CBG < 70: Eat or drink something sweet and recheck, CBG 70 - 120: 0 units CBG 121 - 150: 1 unit CBG 151 - 200: 2 units CBG 201 - 250: 3 units CBG 251 - 300: 5 units CBG 301 - 350: 7 units CBG 351 - 400: 9 units CBG > 400: call MD 08/07/23 01/18/24  [name], MD  insulin  glargine (LANTUS ) 100 UNIT/ML Solostar Pen Inject 12 Units into the skin daily. May substitute as needed per insurance. 10/15/23 01/18/24  [place], [name], DO  Insulin  Pen Needle (B-D UF III MINI PEN NEEDLES) 31G X 5 MM MISC To use with Insulin  pens, as directed 08/07/23   Hongalgi, Trenda BIRCH, MD  Insulin  Pen Needle (PEN NEEDLES) 31G X 5 MM MISC 1 each by Does not apply  route 3 (three) times daily. May dispense any manufacturer covered by patient's insurance. 10/15/23   [place], Camellia PARAS, DO  metoCLOPramide  (REGLAN ) 10 MG tablet Take 1 tablet (10 mg total) by mouth every 6 (six) hours as needed for nausea or vomiting. 01/17/24   Jerral Meth, MD  NOVOLOG  PENFILL cartridge PLEASE SEE ATTACHED FOR DETAILED DIRECTIONS 01/26/24   [provider]  ondansetron  (ZOFRAN ) 4 MG tablet Take 1 tablet (4 mg total) by mouth every 6 (six) hours. 03/20/24   Jerral Meth, MD  oxyCODONE  (ROXICODONE ) 15 MG immediate release tablet Take 15 mg by mouth 4 (four) times daily.    [provider]  Oxycodone  HCl 10 MG TABS Take 10 mg by mouth 4 (four) times daily. 02/08/24   [provider]  prochlorperazine  (COMPAZINE ) 25 MG suppository Place 1 suppository (25 mg total) rectally every 12 (twelve) hours as needed for refractory nausea / vomiting. 01/19/24   Vann, Jessica U, DO  promethazine  (PHENERGAN ) 25 MG suppository Place 1 suppository (25 mg total) rectally every 6 (six) hours as needed for nausea or vomiting. 01/17/24   Jerral Meth, MD  promethazine  (PHENERGAN ) 25 MG suppository Place 1 suppository (25 mg total) rectally every 6 (six) hours as needed for nausea or vomiting. 02/22/24   Horton, [name], MD    Allergies: Mirapex [pramipexole]    Review of Systems  Constitutional: Negative.   HENT: Negative.    Respiratory:  Negative for apnea, cough, choking, chest tightness, shortness of breath, wheezing and stridor.   Cardiovascular: Negative.   Gastrointestinal:  Positive for abdominal pain, nausea and vomiting. Negative for abdominal distention, anal bleeding, blood in stool, constipation, diarrhea and rectal pain.  Genitourinary: Negative.   Musculoskeletal: Negative.   Skin: Negative.   Neurological: Negative.   All other systems reviewed and are negative.   Updated Vital Signs BP 121/74   Pulse 94   Temp 98.4 F (36.9 C) (Oral)   Resp 14   Ht 5' 6 (1.676 m)   Wt 74.8 kg   LMP 03/21/2024   SpO2 100%   BMI 26.62 kg/m   Physical Exam Vitals and nursing note reviewed.  Constitutional:      General: She is not in acute distress.    Appearance: She is well-developed. She is not ill-appearing, toxic-appearing or diaphoretic.  HENT:     Head: Normocephalic and atraumatic.     Nose: Nose normal.     Mouth/Throat:     Mouth: Mucous membranes are moist.  Eyes:     Pupils: Pupils are equal, round, and reactive to light.  Cardiovascular:     Rate and Rhythm: Normal rate.     Pulses: Normal pulses.     Heart sounds: Normal heart sounds.  Pulmonary:     Effort: Pulmonary effort is normal. No respiratory distress.     Breath sounds:  Normal breath sounds.  Chest:     Comments: Mild tenderness lower anterior chest wall with the palpation Abdominal:     General: Bowel sounds are normal. There is no distension.     Palpations: Abdomen is soft.     Tenderness: There is abdominal tenderness. There is no right CVA tenderness, left CVA tenderness, guarding or rebound.     Comments: Diffuse upper abdominal tenderness, worse to epigastric region.  Musculoskeletal:        General: Normal range of motion.     Cervical back: Normal range of motion.     Comments: No bony  tenderness, compartments soft, full range of motion  Skin:    General: Skin is warm and dry.  Neurological:     General: No focal deficit present.     Mental Status: She is alert.  Psychiatric:        Mood and Affect: Mood normal.     (all labs ordered are listed, but only abnormal results are displayed) Labs Reviewed  CBC WITH DIFFERENTIAL/PLATELET - Abnormal; Notable for the following components:      Result Value   WBC 11.5 (*)    Neutro Abs 8.7 (*)    All other components within normal limits  COMPREHENSIVE METABOLIC PANEL WITH GFR - Abnormal; Notable for the following components:   Sodium 132 (*)    Potassium 3.3 (*)    Chloride 86 (*)    Glucose, Bld 499 (*)    All other components within normal limits  LIPASE, BLOOD - Abnormal; Notable for the following components:   Lipase 80 (*)    All other components within normal limits  I-STAT VENOUS BLOOD GAS, ED - Abnormal; Notable for the following components:   pH, Ven 7.541 (*)    pCO2, Ven 42.5 (*)    pO2, Ven 31 (*)    Bicarbonate 36.4 (*)    TCO2 38 (*)    Acid-Base Excess 12.0 (*)    Sodium 130 (*)    Potassium 3.3 (*)    Calcium , Ion 1.12 (*)    All other components within normal limits  I-STAT CHEM 8, ED - Abnormal; Notable for the following components:   Sodium 130 (*)    Potassium 3.4 (*)    Chloride 85 (*)    Glucose, Bld 508 (*)    Calcium , Ion 1.14 (*)    All other components  within normal limits  CBG MONITORING, ED - Abnormal; Notable for the following components:   Glucose-Capillary 467 (*)    All other components within normal limits  HCG, SERUM, QUALITATIVE  BETA-HYDROXYBUTYRIC ACID  MAGNESIUM   URINALYSIS, W/ REFLEX TO CULTURE (INFECTION SUSPECTED)  RAPID URINE DRUG SCREEN, HOSP PERFORMED    EKG: None  Radiology: CT ABDOMEN PELVIS W CONTRAST Result Date: 03/20/2024 CLINICAL DATA:  Abdominal pain nausea vomiting EXAM: CT ABDOMEN AND PELVIS WITH CONTRAST TECHNIQUE: Multidetector CT imaging of the abdomen and pelvis was performed using the standard protocol following bolus administration of intravenous contrast. RADIATION DOSE REDUCTION: This exam was performed according to the departmental dose-optimization program which includes automated exposure control, adjustment of the mA and/or kV according to patient size and/or use of iterative reconstruction technique. CONTRAST:  OMNIPAQUE  IOHEXOL  300 MG/ML  SOLN COMPARISON:  CT 02/22/2024, 12/09/2023, 10/14/2023 FINDINGS: Lower chest: No acute abnormality. Hepatobiliary: Gallstones. No focal hepatic abnormality or biliary dilatation Pancreas: Unremarkable. No pancreatic ductal dilatation or surrounding inflammatory changes. Spleen: Normal in size without focal abnormality. Adrenals/Urinary Tract: Adrenal glands are unremarkable. Kidneys are normal, without renal calculi, focal lesion, or hydronephrosis. Bladder is unremarkable. Stomach/Bowel: Stomach is within normal limits. Appendix appears normal. No evidence of bowel wall thickening, distention, or inflammatory changes. Vascular/Lymphatic: Aortic atherosclerosis. No enlarged abdominal or pelvic lymph nodes. Reproductive: Fibroid uterus.  No adnexal mass Other: No free air. No significant free fluid. Right abdominal wall skin thickening and mild subcutaneous infiltration, chronic Musculoskeletal: No acute or significant osseous findings. IMPRESSION: 1. No CT evidence  for acute intra-abdominal or pelvic abnormality. 2. Gallstones. 3. Fibroid uterus. 4. Aortic atherosclerosis. Aortic Atherosclerosis (ICD10-I70.0). Electronically Signed   By: Luke  Scott M.D.   On: 03/20/2024 00:11     Procedures   Medications Ordered in the ED  sodium chloride  0.9 % bolus 1,000 mL (1,000 mLs Intravenous New Bag/Given 03/21/24 0519)  HYDROmorphone  (DILAUDID ) injection 0.5 mg (0.5 mg Intravenous Given 03/21/24 0520)  droperidol  (INAPSINE ) 2.5 MG/ML injection 1.25 mg (1.25 mg Intravenous Given 03/21/24 0520)  sodium chloride  0.9 % bolus 1,000 mL (1,000 mLs Intravenous New Bag/Given 03/21/24 0546)  insulin  aspart (novoLOG ) injection 10 Units (10 Units Intravenous Given 03/21/24 0614)   Clinical Course as of 03/21/24 9347  [facility] Mar 21, 2024  0517 Dr.  [BH]    Clinical Course User Index [BH] Seger Jani A, PA-C   38 year old type I diabetic, gastroparesis, recurrent chronic abdominal pain here for evaluation of hyperglycemia and abdominal pain.  Seen 2 days ago for similar hadCT scan which did not show any significant abnormality.  She continued to vomit at home.  Checked her blood sugar tonight CBG greater than 500.  EMS arrival blood sugar read high.  She is tachycardic, appears clinically dehydrated.  She has some diffuse upper abdominal pain and some lower chest wall pain with palpation.  She denies any bloody stool or bloody emesis.  Denies illicit substance use.  I reviewed her most recent ED visits as well as admission.  Frequently request IV Dilaudid  for pain medicine.  I discussed with patient given her known gastroparesis that opioid pain medicines would likely worsen her nausea and vomiting.  We had long discussion.  I discussed single dose of narcotic pain medicine.  She is on 15 mg oxycodone  4 times daily. Plan on labs, IV fluids, antiemetics and reassess  Labs and imaging personally viewed and interpreted:  I-STAT Chem-8 CBG 508, sodium 130 suspect pseudohyponatremia in  setting of hyperglycemia potassium 3.4 VBG pH 7.5, bicarb 36 EKG normal Qtc, no ischemic changes CBC leukocytosis 11.5 Metabolic panel sodium 132, potassium 3.3-normal anion gap Beta hydroxy 0.22 Lipase 80 Mag 1.7  Patient reassessed. No evidence of DKA. Getting IV fluids, insulin   Care transferred to [facility] who will FU on remaining labs, imaging and dispo                                 Medical Decision Making Amount and/or Complexity of Data Reviewed Independent Historian: EMS External Data Reviewed: labs, radiology, ECG and notes. Labs: ordered. Decision-making details documented in ED Course. Radiology: ordered and independent interpretation performed. Decision-making details documented in ED Course. ECG/medicine tests: ordered and independent interpretation performed. Decision-making details documented in ED Course.  Risk OTC drugs. Prescription drug management. Parenteral controlled substances. Decision regarding hospitalization. Diagnosis or treatment significantly limited by social determinants of health.        Final diagnoses:  Hyperglycemia  Nausea and vomiting, unspecified vomiting type    ED Discharge Orders     None          Catrell Morrone A, PA-C 03/21/24 9347    Griselda Norris, MD 03/22/24 [phone]

## 2024-03-21 NOTE — ED Triage Notes (Signed)
 Patient BIB EMS from home with complaint of hyperglycemia.   Patient reports was diagnosed with gastritis on 03/19/24 and discharged home. Patient reports pain waking her up. Patient checking blood sugar and was 500.  EMS checked patients CBG and it read High.

## 2024-03-27 ENCOUNTER — Emergency Department (HOSPITAL_COMMUNITY)

## 2024-03-27 ENCOUNTER — Emergency Department (HOSPITAL_COMMUNITY)
Admission: EM | Admit: 2024-03-27 | Discharge: 2024-03-27 | Disposition: A | Discharge status: Home / Self Care | Attending: Emergency Medicine | Admitting: Emergency Medicine

## 2024-03-27 ENCOUNTER — Other Ambulatory Visit: Payer: Self-pay

## 2024-03-27 DIAGNOSIS — Z794 Long term (current) use of insulin: Secondary | ICD-10-CM | POA: Insufficient documentation

## 2024-03-27 DIAGNOSIS — E1040 Type 1 diabetes mellitus with diabetic neuropathy, unspecified: Secondary | ICD-10-CM | POA: Insufficient documentation

## 2024-03-27 DIAGNOSIS — K3184 Gastroparesis: Secondary | ICD-10-CM | POA: Insufficient documentation

## 2024-03-27 DIAGNOSIS — E1065 Type 1 diabetes mellitus with hyperglycemia: Secondary | ICD-10-CM | POA: Insufficient documentation

## 2024-03-27 DIAGNOSIS — R112 Nausea with vomiting, unspecified: Secondary | ICD-10-CM | POA: Diagnosis present

## 2024-03-27 LAB — LIPASE, BLOOD: Lipase: 114 U/L — ABNORMAL HIGH (ref 11–51)

## 2024-03-27 LAB — CBC WITH DIFFERENTIAL/PLATELET
Abs Immature Granulocytes: 0.05 K/uL (ref 0.00–0.07)
Basophils Absolute: 0 K/uL (ref 0.0–0.1)
Basophils Relative: 0 %
Eosinophils Absolute: 0 K/uL (ref 0.0–0.5)
Eosinophils Relative: 0 %
HCT: 37 % (ref 36.0–46.0)
Hemoglobin: 12.1 g/dL (ref 12.0–15.0)
Immature Granulocytes: 0 %
Lymphocytes Relative: 11 %
Lymphs Abs: 1.4 K/uL (ref 0.7–4.0)
MCH: 29.9 pg (ref 26.0–34.0)
MCHC: 32.7 g/dL (ref 30.0–36.0)
MCV: 91.4 fL (ref 80.0–100.0)
Monocytes Absolute: 0.4 K/uL (ref 0.1–1.0)
Monocytes Relative: 3 %
Neutro Abs: 10.7 K/uL — ABNORMAL HIGH (ref 1.7–7.7)
Neutrophils Relative %: 86 %
Platelets: 389 K/uL (ref 150–400)
RBC: 4.05 MIL/uL (ref 3.87–5.11)
RDW: 13.3 % (ref 11.5–15.5)
WBC: 12.6 K/uL — ABNORMAL HIGH (ref 4.0–10.5)
nRBC: 0 % (ref 0.0–0.2)

## 2024-03-27 LAB — COMPREHENSIVE METABOLIC PANEL WITH GFR
ALT: 13 U/L (ref 0–44)
AST: 17 U/L (ref 15–41)
Albumin: 3.2 g/dL — ABNORMAL LOW (ref 3.5–5.0)
Alkaline Phosphatase: 87 U/L (ref 38–126)
Anion gap: 10 (ref 5–15)
BUN: 8 mg/dL (ref 6–20)
CO2: 29 mmol/L (ref 22–32)
Calcium: 9 mg/dL (ref 8.9–10.3)
Chloride: 97 mmol/L — ABNORMAL LOW (ref 98–111)
Creatinine, Ser: 0.66 mg/dL (ref 0.44–1.00)
GFR, Estimated: >60 mL/min (ref >60)
Glucose, Bld: 331 mg/dL — ABNORMAL HIGH (ref 70–99)
Potassium: 3.7 mmol/L (ref 3.5–5.1)
Sodium: 136 mmol/L (ref 135–145)
Total Bilirubin: 0.5 mg/dL (ref 0.0–1.2)
Total Protein: 7 g/dL (ref 6.5–8.1)

## 2024-03-27 LAB — HCG, SERUM, QUALITATIVE: Preg, Serum: NEGATIVE

## 2024-03-27 LAB — BETA-HYDROXYBUTYRIC ACID: Beta-Hydroxybutyric Acid: 0.35 mmol/L — ABNORMAL HIGH (ref 0.05–0.27)

## 2024-03-27 MED ORDER — PANTOPRAZOLE SODIUM 20 MG PO TBEC: 20.0000 mg | DELAYED_RELEASE_TABLET | Freq: Every day | ORAL | 0 refills | Status: DC

## 2024-03-27 MED ORDER — HYDROMORPHONE HCL 1 MG/ML IJ SOLN
0.5000 mg | Freq: Once | INTRAMUSCULAR | Status: AC
  Administered 2024-03-27: 0.5 mg via INTRAVENOUS
  Filled 2024-03-27: qty 1

## 2024-03-27 MED ORDER — SODIUM CHLORIDE 0.9 % IV BOLUS
1000.0000 mL | Freq: Once | INTRAVENOUS | Status: AC
  Administered 2024-03-27: 1000 mL via INTRAVENOUS

## 2024-03-27 MED ORDER — IOHEXOL 300 MG/ML  SOLN
100.0000 mL | Freq: Once | INTRAMUSCULAR | Status: AC | PRN
  Administered 2024-03-27: 100 mL via INTRAVENOUS

## 2024-03-27 MED ORDER — DROPERIDOL 2.5 MG/ML IJ SOLN
1.2500 mg | Freq: Once | INTRAMUSCULAR | Status: AC
  Administered 2024-03-27: 1.25 mg via INTRAVENOUS
  Filled 2024-03-27: qty 2

## 2024-03-27 MED ORDER — KETOROLAC TROMETHAMINE 15 MG/ML IJ SOLN
15.0000 mg | Freq: Once | INTRAMUSCULAR | Status: AC
  Administered 2024-03-27: 15 mg via INTRAVENOUS
  Filled 2024-03-27: qty 1

## 2024-03-27 NOTE — ED Notes (Signed)
 Pt tolerating PO ginger ale and crackers without issue. PT reports that she has called her mother for a ride home, and she will be here shortly.

## 2024-03-27 NOTE — ED Notes (Signed)
 Pt apprehensive about PO intake. Pt encouraged to try. PT also provided with crackers.

## 2024-03-27 NOTE — ED Notes (Signed)
 Discharge instructions, medications, and follow up care reviewed with and provided to pt. Pt denies any further questions, and has verbalized understanding.

## 2024-03-27 NOTE — Discharge Instructions (Addendum)
 We evaluated you for your abdominal pain. Your testing in the emergency department was reassuring.  Your CT scan showed some inflammation in your esophagus which may be contributing to your pain.  We have prescribed you an antiacid medication to take.  Please follow-up with a GI doctor.  If you do not have a GI doctor you can call [facility] for follow-up.  Please return for any new or worsening symptoms.

## 2024-03-27 NOTE — ED Notes (Signed)
Pt provided with diet ginger ale for PO challenge.

## 2024-03-27 NOTE — ED Notes (Signed)
 Pt resting at this time with even and unlabored respirations. No signs of distress noted.

## 2024-03-27 NOTE — ED Provider Notes (Signed)
 [place] EMERGENCY DEPARTMENT AT [facility] Provider Note  CSN: [id number] Arrival date & time: 03/27/24 1242  Chief Complaint(s) Abdominal Pain, Nausea, and Emesis  HPI [name] is a 38 y.o. female history of type 1 diabetes, gastroparesis presenting to the emergency department abdominal pain.  She reports the pain is identical to prior episodes of gastroparesis.  Last was around 1 week ago.  No fevers or chills.  Reports associated nausea and vomiting.  No hematemesis.  No diarrhea, constipation, hematochezia.  No fevers or chills.  No urinary symptoms.  No cough or shortness of breath.  Symptoms began this morning   Past Medical History Past Medical History:  Diagnosis Date   Chronic constipation 11/08/2021   Diabetic neuropathy (HCC)    History of chicken pox    History of noncompliance with medical treatment 09/07/2022   Type 1 diabetes (HCC)    Vaginal odor 11/08/2021   Patient Active Problem List   Diagnosis Date Noted   Cannabis use disorder 01/18/2024   Pseudohyponatremia 01/18/2024   Anxiety 05/06/2023   DKA (diabetic ketoacidosis) (HCC) 05/06/2023   Cholelithiasis 04/04/2023   Hepatic steatosis 04/04/2023   Transaminitis 04/04/2023   Type 1 diabetes mellitus with hyperosmolar hyperglycemic state (HHS) (HCC) 03/31/2023   Intractable cyclical vomiting with nausea 03/31/2023   Cellulitis 02/02/2023   Severe hyperglycemia due to diabetes mellitus (HCC) 02/02/2023   Prolonged QT interval 02/02/2023   Intractable nausea and vomiting 09/07/2022   Gastroparesis 09/07/2022   Uncontrolled type 1 diabetes mellitus with hyperglycemia, with long-term current use of insulin  (HCC) 09/07/2022   Diabetes mellitus due to underlying condition with diabetic neuropathy (HCC) 09/07/2022   History of noncompliance with medical treatment 09/07/2022   Nausea & vomiting 05/08/2022   Misuse of medication 05/08/2022   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   AKI (acute kidney injury) (HCC) 03/09/2022   UTI (urinary tract infection) 03/09/2022   Chronic pain syndrome 03/09/2022   Vulvar dysplasia    Type 1 diabetes mellitus with hyperlipidemia (HCC) 12/02/2021   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 Authorizing Provider  pantoprazole  (PROTONIX ) 20 MG tablet Take 1 tablet (20 mg total) by mouth daily. 03/27/24  Yes Francesca Elsie CROME, MD  Acetaminophen  (MIDOL  PO) Take 1 tablet by mouth daily as needed (cramping).    [provider]  capsicum (ZOSTRIX) 0.075 % topical cream Apply topically 2 (two) times daily. To abdomen 01/19/24   Juvenal Raisin U, DO  Continuous Blood Gluc Transmit (DEXCOM G6 TRANSMITTER) MISC Inject 1 each into the skin every 14 (fourteen) days. 12/31/21   Lucius Krabbe, NP  Continuous Glucose Sensor (DEXCOM G7 SENSOR) MISC Inject 1 Device into the skin See admin instructions. Place 1 new sensor into the skin every 10 days    [provider]  DRYSOL 20 % external solution Apply 1 Application topically 2 (two) times a week. 12/16/23   [provider]  DULoxetine  (CYMBALTA )  60 MG capsule Take 60 mg by mouth at bedtime. 06/28/22   [provider]  insulin  aspart (NOVOLOG ) 100 UNIT/ML FlexPen Inject 0-9 Units into the skin 3 (three) times daily with meals. CBG < 70: Eat or drink something sweet and recheck, CBG 70  - 120: 0 units CBG 121 - 150: 1 unit CBG 151 - 200: 2 units CBG 201 - 250: 3 units CBG 251 - 300: 5 units CBG 301 - 350: 7 units CBG 351 - 400: 9 units CBG > 400: call MD. Patient taking differently: Inject 0-9 Units into the skin See admin instructions. Inject 0-9 units into the skin 3 times a day with meals, PER SLIDING SCALE: CBG < 70: Eat or drink something sweet and recheck, CBG 70 - 120: 0 units CBG 121 - 150: 1 unit CBG 151 - 200: 2 units CBG 201 - 250: 3 units CBG 251 - 300: 5 units CBG 301 - 350: 7 units CBG 351 - 400: 9 units CBG > 400: call MD 08/07/23 01/18/24  [name], MD  insulin  glargine (LANTUS ) 100 UNIT/ML Solostar Pen Inject 12 Units into the skin daily. May substitute as needed per insurance. 10/15/23 01/18/24  [place], [name], DO  Insulin  Pen Needle (B-D UF III MINI PEN NEEDLES) 31G X 5 MM MISC To use with Insulin  pens, as directed 08/07/23   Hongalgi, Trenda BIRCH, MD  Insulin  Pen Needle (PEN NEEDLES) 31G X 5 MM MISC 1 each by Does not apply route 3 (three) times daily. May dispense any manufacturer covered by patient's insurance. 10/15/23   [place], Camellia PARAS, DO  metoCLOPramide  (REGLAN ) 10 MG tablet Take 1 tablet (10 mg total) by mouth every 6 (six) hours as needed for nausea or vomiting. 01/17/24   Jerral Meth, MD  NOVOLOG  PENFILL cartridge PLEASE SEE ATTACHED FOR DETAILED DIRECTIONS 01/26/24   [provider]  ondansetron  (ZOFRAN ) 4 MG tablet Take 1 tablet (4 mg total) by mouth every 6 (six) hours. 03/20/24   Jerral Meth, MD  oxyCODONE  (ROXICODONE ) 15 MG immediate release tablet Take 15 mg by mouth 4 (four) times daily.    [provider]  Oxycodone  HCl 10 MG TABS Take 10 mg by mouth 4 (four) times daily. 02/08/24   [provider]  prochlorperazine  (COMPAZINE ) 25 MG suppository Place 1 suppository (25 mg total) rectally every 12 (twelve) hours as needed for refractory nausea / vomiting. 01/19/24   Vann, Jessica U, DO  promethazine  (PHENERGAN ) 25 MG  suppository Place 1 suppository (25 mg total) rectally every 6 (six) hours as needed for nausea or vomiting. 01/17/24   Jerral Meth, MD  promethazine  (PHENERGAN ) 25 MG suppository Place 1 suppository (25 mg total) rectally every 6 (six) hours as needed for nausea or vomiting. 02/22/24   Horton, [name], MD  Past Surgical History Past Surgical History:  Procedure Laterality Date   CESAREAN SECTION     only one C/S per pt.   CO2 LASER APPLICATION N/A 02/22/2022   Procedure: CO2 LASER APPLICATION TO VULVA;  Surgeon: Viktoria Comer SAUNDERS, MD;  Location: [facility];  Service: General;  Laterality: N/A;   DILATION AND CURETTAGE OF UTERUS     EXCISION OF SKIN TAG N/A 02/22/2022   Procedure: EXCISION OF ANAL SKIN TAGS;  Surgeon: Debby Hila, MD;  Location: [facility];  Service: General;  Laterality: N/A;   Family History Family History  Problem Relation Age of Onset   Hypertension Mother    Glaucoma Mother    Diabetes Maternal Grandmother    Neuropathy Neg Hx    Colon cancer Neg Hx    Stomach cancer Neg Hx    Esophageal cancer Neg Hx    Colon polyps Neg Hx    Rectal cancer Neg Hx     Social History Social History   Tobacco Use   Smoking status: Never    Passive exposure: Never   Smokeless tobacco: Never  Vaping Use   Vaping status: Never Used  Substance Use Topics   Alcohol use: No   Drug use: Never   Allergies Mirapex [pramipexole]  Review of Systems Review of Systems  All other systems reviewed and are negative.   Physical Exam Vital Signs  I have reviewed the triage vital signs BP (!) 166/89 (BP Location: Right Arm)   Pulse (!) 101   Temp 98.1 F (36.7 C) (Oral)   Resp 19   Ht 5' 6 (1.676 m)   Wt 74.8 kg   LMP 03/21/2024 (Approximate)   SpO2 99%   BMI 26.63 kg/m  Physical Exam Vitals and  nursing note reviewed.  Constitutional:      General: She is in acute distress.     Appearance: She is well-developed.  HENT:     Head: Normocephalic and atraumatic.     Mouth/Throat:     Mouth: Mucous membranes are moist.  Eyes:     Pupils: Pupils are equal, round, and reactive to light.  Cardiovascular:     Rate and Rhythm: Normal rate and regular rhythm.     Heart sounds: No murmur heard. Pulmonary:     Effort: Pulmonary effort is normal. No respiratory distress.     Breath sounds: Normal breath sounds.  Abdominal:     General: Abdomen is flat.     Palpations: Abdomen is soft.     Tenderness: There is no abdominal tenderness.  Musculoskeletal:        General: No tenderness.     Right lower leg: No edema.     Left lower leg: No edema.  Skin:    General: Skin is warm and dry.  Neurological:     General: No focal deficit present.     Mental Status: She is alert. Mental status is at baseline.  Psychiatric:        Mood and Affect: Mood normal.        Behavior: Behavior normal.     ED Results and Treatments Labs (all labs ordered are listed, but only abnormal results are displayed) Labs Reviewed  COMPREHENSIVE METABOLIC PANEL WITH GFR - Abnormal; Notable for the following components:      Result Value   Chloride 97 (*)    Glucose, Bld 331 (*)    Albumin 3.2 (*)    All other components within  normal limits  CBC WITH DIFFERENTIAL/PLATELET - Abnormal; Notable for the following components:   WBC 12.6 (*)    Neutro Abs 10.7 (*)    All other components within normal limits  LIPASE, BLOOD - Abnormal; Notable for the following components:   Lipase 114 (*)    All other components within normal limits  BETA-HYDROXYBUTYRIC ACID - Abnormal; Notable for the following components:   Beta-Hydroxybutyric Acid 0.35 (*)    All other components within normal limits  HCG, SERUM, QUALITATIVE  CBG MONITORING, ED                                                                                                                           Radiology CT ABDOMEN PELVIS W CONTRAST Result Date: 03/27/2024 CLINICAL DATA:  Pancreatitis, acute, severe. Abdominal pain with nausea. History of gastroparesis. EXAM: CT ABDOMEN AND PELVIS WITH CONTRAST TECHNIQUE: Multidetector CT imaging of the abdomen and pelvis was performed using the standard protocol following bolus administration of intravenous contrast. RADIATION DOSE REDUCTION: This exam was performed according to the departmental dose-optimization program which includes automated exposure control, adjustment of the mA and/or kV according to patient size and/or use of iterative reconstruction technique. CONTRAST:  OMNIPAQUE  IOHEXOL  300 MG/ML  SOLN COMPARISON:  03/19/2024. FINDINGS: Lower chest: Atelectasis is present at the lung bases. Hepatobiliary: No focal abnormality in the liver. Fatty infiltration of the liver is noted. Stones are present within the gallbladder. No biliary ductal dilatation is seen. Pancreas: Unremarkable. No pancreatic ductal dilatation or surrounding inflammatory changes. Spleen: Normal in size without focal abnormality. Adrenals/Urinary Tract: The adrenal glands are within normal limits. The kidneys enhance symmetrically. No renal calculus or hydronephrosis bilaterally. The bladder is unremarkable. Stomach/Bowel: There is thickening of the walls of the distal esophagus. The stomach is otherwise within normal limits. No bowel obstruction, free air, or pneumatosis is seen. Appendix appears normal. Vascular/Lymphatic: Aortic atherosclerosis. No enlarged abdominal or pelvic lymph nodes. Reproductive: Multiple fibroids are noted in the uterus. No adnexal mass. Other: A trace amount of free fluid is noted in the cul-de-sac. Musculoskeletal: No acute osseous abnormality. IMPRESSION: 1. No evidence of acute pancreatitis. 2. Thickening of the walls of the distal esophagus, possible esophagitis. 3. Cholelithiasis. 4. Hepatic steatosis.  5. Fibroid uterus. 6. Aortic atherosclerosis. Electronically Signed   By: Leita Birmingham M.D.   On: 03/27/2024 15:16    Pertinent labs & imaging results that were available during my care of the patient were reviewed by me and considered in my medical decision making (see MDM for details).  Medications Ordered in ED Medications  droperidol  (INAPSINE ) 2.5 MG/ML injection 1.25 mg (1.25 mg Intravenous Given 03/27/24 1333)  HYDROmorphone  (DILAUDID ) injection 0.5 mg (0.5 mg Intravenous Given 03/27/24 1335)  sodium chloride  0.9 % bolus 1,000 mL (0 mLs Intravenous Stopped 03/27/24 1432)  iohexol  (OMNIPAQUE ) 300 MG/ML solution 100 mL (100 mLs Intravenous Contrast Given 03/27/24 1417)  droperidol  (INAPSINE ) 2.5 MG/ML injection 1.25 mg (1.25 mg  Intravenous Given 03/27/24 1532)  ketorolac  (TORADOL ) 15 MG/ML injection 15 mg (15 mg Intravenous Given 03/27/24 1532)                                                                                                                                     Procedures Procedures  (including critical care time)  Medical Decision Making / ED Course   MDM:  38 year old presenting to the emergency department abdominal pain.  Strongly suspect exacerbation of chronic gastroparesis.  She reports her symptoms are identical.  Considered other process such as pancreatitis, cholecystitis, appendicitis, obstruction but overall low concern.  No peritoneal signs or tenderness on exam.  Will check labs including lipase, CMP, hCG lower concern for DKA but will check beta hydroxy butyrate.  Will reassess.  Clinical Course as of 03/27/24 1538  Sat Mar 27, 2024  1357 Patient does have an uptrending lipase compared to prior labs. Suspect symptoms more likely due to gastroparesis but given increasing lipase will obtain CT  scan [WS]  1536 CT scan without acute process.  No pancreatitis.  She has some esophagitis.  Prescribed Protonix .  Patient feels much better after medications. Will discharge  patient to home. All questions answered. Patient comfortable with plan of discharge. Return precautions discussed with patient and specified on the after visit summary.  [WS]    Clinical Course User Index [WS] Francesca Elsie CROME, MD     Additional history obtained: -Additional history obtained from ems -External records from outside source obtained and reviewed including: Chart review including previous notes, labs, imaging, consultation notes including prior notes    Lab Tests: -I ordered, reviewed, and interpreted labs.   The pertinent results include:   Labs Reviewed  COMPREHENSIVE METABOLIC PANEL WITH GFR - Abnormal; Notable for the following components:      Result Value   Chloride 97 (*)    Glucose, Bld 331 (*)    Albumin 3.2 (*)    All other components within normal limits  CBC WITH DIFFERENTIAL/PLATELET - Abnormal; Notable for the following components:   WBC 12.6 (*)    Neutro Abs 10.7 (*)    All other components within normal limits  LIPASE, BLOOD - Abnormal; Notable for the following components:   Lipase 114 (*)    All other components within normal limits  BETA-HYDROXYBUTYRIC ACID - Abnormal; Notable for the following components:   Beta-Hydroxybutyric Acid 0.35 (*)    All other components within normal limits  HCG, SERUM, QUALITATIVE  CBG MONITORING, ED    Notable for mild hyperglycemia, no anion gap to suggest DKA. Minimally elevated beta hydroxybutyrate not consistent with DKA. Elevated lipase without signs of pancreatitis   EKG   EKG Interpretation Date/Time:  Saturday March 27 2024 13:16:06 EDT Ventricular Rate:  105 PR Interval:  164 QRS Duration:  73 QT Interval:  357 QTC Calculation: 472 R Axis:   73  Text Interpretation: Sinus  tachycardia Confirmed by Francesca Fallow ([id number]) on 03/27/2024 1:37:03 PM         Imaging Studies ordered: I ordered imaging studies including CT abdomen On my interpretation imaging demonstrates no acute process I  independently visualized and interpreted imaging. I agree with the radiologist interpretation   Medicines ordered and prescription drug management: Meds ordered this encounter  Medications   droperidol  (INAPSINE ) 2.5 MG/ML injection 1.25 mg   HYDROmorphone  (DILAUDID ) injection 0.5 mg   sodium chloride  0.9 % bolus 1,000 mL   iohexol  (OMNIPAQUE ) 300 MG/ML solution 100 mL   droperidol  (INAPSINE ) 2.5 MG/ML injection 1.25 mg   ketorolac  (TORADOL ) 15 MG/ML injection 15 mg   pantoprazole  (PROTONIX ) 20 MG tablet    Sig: Take 1 tablet (20 mg total) by mouth daily.    Dispense:  30 tablet    Refill:  0    -I have reviewed the patients home medicines and have made adjustments as needed   Reevaluation: After the interventions noted above, I reevaluated the patient and found that their symptoms have resolved  Co morbidities that complicate the patient evaluation  Past Medical History:  Diagnosis Date   Chronic constipation 11/08/2021   Diabetic neuropathy (HCC)    History of chicken pox    History of noncompliance with medical treatment 09/07/2022   Type 1 diabetes (HCC)    Vaginal odor 11/08/2021      Dispostion: Disposition decision including need for hospitalization was considered, and patient discharged from emergency department.    Final Clinical Impression(s) / ED Diagnoses Final diagnoses:  Gastroparesis     This chart was dictated using voice recognition software.  Despite best efforts to proofread,  errors can occur which can change the documentation meaning.    Francesca Fallow CROME, MD 03/27/24 1538

## 2024-03-27 NOTE — ED Triage Notes (Signed)
 Pt arrives via EMS from home. Pt has complaints of abdominal pain, with nausea. Pt reports a hx of gastroparesis and states that she was recently seen here for similar sx. Pt is rocking back in forth during triage.   EMS administered 4mg  Ondansetron  and 500mL of NS.

## 2024-04-24 ENCOUNTER — Other Ambulatory Visit: Payer: Self-pay

## 2024-04-24 ENCOUNTER — Observation Stay (HOSPITAL_BASED_OUTPATIENT_CLINIC_OR_DEPARTMENT_OTHER)
Admission: EM | Admit: 2024-04-24 | Discharge: 2024-04-26 | Disposition: A | Discharge status: Home / Self Care | Attending: Emergency Medicine | Admitting: Emergency Medicine

## 2024-04-24 DIAGNOSIS — E1040 Type 1 diabetes mellitus with diabetic neuropathy, unspecified: Secondary | ICD-10-CM | POA: Diagnosis not present

## 2024-04-24 DIAGNOSIS — R112 Nausea with vomiting, unspecified: Principal | ICD-10-CM | POA: Diagnosis present

## 2024-04-24 DIAGNOSIS — F12920 Cannabis use, unspecified with intoxication, uncomplicated: Secondary | ICD-10-CM | POA: Insufficient documentation

## 2024-04-24 DIAGNOSIS — G4700 Insomnia, unspecified: Secondary | ICD-10-CM | POA: Insufficient documentation

## 2024-04-24 DIAGNOSIS — Z87412 Personal history of vulvar dysplasia: Secondary | ICD-10-CM | POA: Diagnosis not present

## 2024-04-24 DIAGNOSIS — R1110 Vomiting, unspecified: Secondary | ICD-10-CM | POA: Diagnosis present

## 2024-04-24 DIAGNOSIS — E1010 Type 1 diabetes mellitus with ketoacidosis without coma: Secondary | ICD-10-CM | POA: Diagnosis not present

## 2024-04-24 DIAGNOSIS — K2100 Gastro-esophageal reflux disease with esophagitis, without bleeding: Secondary | ICD-10-CM | POA: Insufficient documentation

## 2024-04-24 DIAGNOSIS — K3184 Gastroparesis: Secondary | ICD-10-CM

## 2024-04-24 DIAGNOSIS — F39 Unspecified mood [affective] disorder: Secondary | ICD-10-CM | POA: Diagnosis not present

## 2024-04-24 LAB — COMPREHENSIVE METABOLIC PANEL WITH GFR
ALT: 12 U/L (ref 0–44)
AST: 19 U/L (ref 15–41)
Albumin: 4.7 g/dL (ref 3.5–5.0)
Alkaline Phosphatase: 105 U/L (ref 38–126)
Anion gap: 20 — ABNORMAL HIGH (ref 5–15)
BUN: 9 mg/dL (ref 6–20)
CO2: 26 mmol/L (ref 22–32)
Calcium: 10.4 mg/dL — ABNORMAL HIGH (ref 8.9–10.3)
Chloride: 95 mmol/L — ABNORMAL LOW (ref 98–111)
Creatinine, Ser: 0.73 mg/dL (ref 0.44–1.00)
GFR, Estimated: >60 mL/min (ref >60)
Glucose, Bld: 216 mg/dL — ABNORMAL HIGH (ref 70–99)
Potassium: 3.6 mmol/L (ref 3.5–5.1)
Sodium: 141 mmol/L (ref 135–145)
Total Bilirubin: 0.5 mg/dL (ref 0.0–1.2)
Total Protein: 8.3 g/dL — ABNORMAL HIGH (ref 6.5–8.1)

## 2024-04-24 LAB — CBC WITH DIFFERENTIAL/PLATELET
Abs Immature Granulocytes: 0.01 K/uL (ref 0.00–0.07)
Basophils Absolute: 0 K/uL (ref 0.0–0.1)
Basophils Relative: 0 %
Eosinophils Absolute: 0 K/uL (ref 0.0–0.5)
Eosinophils Relative: 0 %
HCT: 39.6 % (ref 36.0–46.0)
Hemoglobin: 13.7 g/dL (ref 12.0–15.0)
Immature Granulocytes: 0 %
Lymphocytes Relative: 26 %
Lymphs Abs: 2.1 K/uL (ref 0.7–4.0)
MCH: 31.4 pg (ref 26.0–34.0)
MCHC: 34.6 g/dL (ref 30.0–36.0)
MCV: 90.6 fL (ref 80.0–100.0)
Monocytes Absolute: 0.5 K/uL (ref 0.1–1.0)
Monocytes Relative: 6 %
Neutro Abs: 5.2 K/uL (ref 1.7–7.7)
Neutrophils Relative %: 68 %
Platelets: 379 K/uL (ref 150–400)
RBC: 4.37 MIL/uL (ref 3.87–5.11)
RDW: 13.4 % (ref 11.5–15.5)
WBC: 7.8 K/uL (ref 4.0–10.5)
nRBC: 0 % (ref 0.0–0.2)

## 2024-04-24 LAB — I-STAT VENOUS BLOOD GAS, ED
Acid-Base Excess: 7 mmol/L — ABNORMAL HIGH (ref 0.0–2.0)
Bicarbonate: 33.1 mmol/L — ABNORMAL HIGH (ref 20.0–28.0)
Calcium, Ion: 1.19 mmol/L (ref 1.15–1.40)
HCT: 42 % (ref 36.0–46.0)
Hemoglobin: 14.3 g/dL (ref 12.0–15.0)
O2 Saturation: 55 %
Patient temperature: 99.3
Potassium: 3.8 mmol/L (ref 3.5–5.1)
Sodium: 137 mmol/L (ref 135–145)
TCO2: 35 mmol/L — ABNORMAL HIGH (ref 22–32)
pCO2, Ven: 51.1 mmHg (ref 44–60)
pH, Ven: 7.422 (ref 7.25–7.43)
pO2, Ven: 29 mmHg — CL (ref 32–45)

## 2024-04-24 LAB — CBG MONITORING, ED
Glucose-Capillary: 202 mg/dL — ABNORMAL HIGH (ref 70–99)
Glucose-Capillary: 215 mg/dL — ABNORMAL HIGH (ref 70–99)

## 2024-04-24 LAB — MAGNESIUM: Magnesium: 1.9 mg/dL (ref 1.7–2.4)

## 2024-04-24 LAB — LIPASE, BLOOD: Lipase: 15 U/L (ref 11–51)

## 2024-04-24 LAB — LACTIC ACID, PLASMA
Lactic Acid, Venous: 2.6 mmol/L (ref 0.5–1.9)
Lactic Acid, Venous: 2.2 mmol/L (ref 0.5–1.9)

## 2024-04-24 LAB — HCG, QUANTITATIVE, PREGNANCY: hCG, Beta Chain, Quant, S: <1 m[IU]/mL (ref <5)

## 2024-04-24 MED ORDER — DROPERIDOL 2.5 MG/ML IJ SOLN
1.2500 mg | Freq: Once | INTRAMUSCULAR | Status: AC
  Administered 2024-04-24: 1.25 mg via INTRAVENOUS
  Filled 2024-04-24: qty 2

## 2024-04-24 MED ORDER — DIPHENHYDRAMINE HCL 50 MG/ML IJ SOLN
25.0000 mg | Freq: Once | INTRAMUSCULAR | Status: AC
  Administered 2024-04-24: 25 mg via INTRAVENOUS
  Filled 2024-04-24: qty 1

## 2024-04-24 MED ORDER — LACTATED RINGERS IV BOLUS
1000.0000 mL | Freq: Once | INTRAVENOUS | Status: AC
  Administered 2024-04-24: 1000 mL via INTRAVENOUS

## 2024-04-24 MED ORDER — METOCLOPRAMIDE HCL 5 MG/ML IJ SOLN
10.0000 mg | Freq: Once | INTRAMUSCULAR | Status: AC
  Administered 2024-04-24: 10 mg via INTRAVENOUS
  Filled 2024-04-24: qty 2

## 2024-04-24 MED ORDER — HYDROMORPHONE HCL 1 MG/ML IJ SOLN
0.5000 mg | Freq: Once | INTRAMUSCULAR | Status: AC
  Administered 2024-04-24: 0.5 mg via INTRAVENOUS
  Filled 2024-04-24: qty 1

## 2024-04-24 NOTE — ED Provider Notes (Signed)
 [place] EMERGENCY DEPARTMENT AT [facility] Provider Note   CSN: [id number] Arrival date & time: 04/24/24  1653     Patient presents with: Emesis   Lidia L McAdoo Collier is a 38 y.o. female.  {Add pertinent medical, surgical, social history, OB history to HPI:32947}  Emesis      Prior to Admission medications   Medication Sig Start Date End Date Taking? Authorizing Provider  Acetaminophen  (MIDOL  PO) Take 1 tablet by mouth daily as needed (cramping).    [provider]  capsicum (ZOSTRIX) 0.075 % topical cream Apply topically 2 (two) times daily. To abdomen 01/19/24   Juvenal Raisin U, DO  Continuous Blood Gluc Transmit (DEXCOM G6 TRANSMITTER) MISC Inject 1 each into the skin every 14 (fourteen) days. 12/31/21   Lucius Krabbe, NP  Continuous Glucose Sensor (DEXCOM G7 SENSOR) MISC Inject 1 Device into the skin See admin instructions. Place 1 new sensor into the skin every 10 days    [provider]  DRYSOL 20 % external solution Apply 1 Application topically 2 (two) times a week. 12/16/23   [provider]  DULoxetine  (CYMBALTA ) 60 MG capsule Take 60 mg by mouth at bedtime. 06/28/22   [provider]  insulin  aspart (NOVOLOG ) 100 UNIT/ML FlexPen Inject 0-9 Units into the skin 3 (three) times daily with meals. CBG < 70: Eat or drink something sweet and recheck, CBG 70 - 120: 0 units CBG 121 - 150: 1 unit CBG 151 - 200: 2 units CBG 201 - 250: 3 units CBG 251 - 300: 5 units CBG 301 - 350: 7 units CBG 351 - 400: 9 units CBG > 400: call MD. Patient taking differently: Inject 0-9 Units into the skin See admin instructions. Inject 0-9 units into the skin 3 times a day with meals, PER SLIDING SCALE: CBG < 70: Eat or drink something sweet and recheck, CBG 70 - 120: 0 units CBG 121 - 150: 1 unit CBG 151 - 200: 2 units CBG 201 - 250: 3 units CBG 251 - 300: 5 units CBG 301 - 350: 7 units CBG 351 - 400: 9 units CBG > 400: call MD 08/07/23 01/18/24   [name], MD  insulin  glargine (LANTUS ) 100 UNIT/ML Solostar Pen Inject 12 Units into the skin daily. May substitute as needed per insurance. 10/15/23 01/18/24  [place], [name], DO  Insulin  Pen Needle (B-D UF III MINI PEN NEEDLES) 31G X 5 MM MISC To use with Insulin  pens, as directed 08/07/23   Hongalgi, Trenda BIRCH, MD  Insulin  Pen Needle (PEN NEEDLES) 31G X 5 MM MISC 1 each by Does not apply route 3 (three) times daily. May dispense any manufacturer covered by patient's insurance. 10/15/23   [place], Camellia PARAS, DO  metoCLOPramide  (REGLAN ) 10 MG tablet Take 1 tablet (10 mg total) by mouth every 6 (six) hours as needed for nausea or vomiting. 01/17/24   Jerral Meth, MD  NOVOLOG  PENFILL cartridge PLEASE SEE ATTACHED FOR DETAILED DIRECTIONS 01/26/24   [provider]  ondansetron  (ZOFRAN ) 4 MG tablet Take 1 tablet (4 mg total) by mouth every 6 (six) hours. 03/20/24   Jerral Meth, MD  oxyCODONE  (ROXICODONE ) 15 MG immediate release tablet Take 15 mg by mouth 4 (four) times daily.    [provider]  Oxycodone  HCl 10 MG TABS Take 10 mg by mouth 4 (four) times daily. 02/08/24   [provider]  pantoprazole  (PROTONIX ) 20 MG tablet Take 1 tablet (20 mg total) by  mouth daily. 03/27/24   Francesca Elsie CROME, MD  prochlorperazine  (COMPAZINE ) 25 MG suppository Place 1 suppository (25 mg total) rectally every 12 (twelve) hours as needed for refractory nausea / vomiting. 01/19/24   Vann, Jessica U, DO  promethazine  (PHENERGAN ) 25 MG suppository Place 1 suppository (25 mg total) rectally every 6 (six) hours as needed for nausea or vomiting. 01/17/24   Jerral Meth, MD  promethazine  (PHENERGAN ) 25 MG suppository Place 1 suppository (25 mg total) rectally every 6 (six) hours as needed for nausea or vomiting. 02/22/24   Horton, [name], MD    Allergies: Mirapex [pramipexole]    Review of Systems  Gastrointestinal:  Positive for vomiting.    Updated Vital Signs BP (!) 126/99    Pulse (!) 121   Temp 99.3 F (37.4 C) (Oral)   Resp (!) 21   LMP 03/21/2024 (Approximate)   SpO2 100%   Physical Exam  (all labs ordered are listed, but only abnormal results are displayed) Labs Reviewed  LACTIC ACID, PLASMA - Abnormal; Notable for the following components:      Result Value   Lactic Acid, Venous 2.6 (*)    All other components within normal limits  COMPREHENSIVE METABOLIC PANEL WITH GFR - Abnormal; Notable for the following components:   Chloride 95 (*)    Glucose, Bld 216 (*)    Calcium  10.4 (*)    Total Protein 8.3 (*)    Anion gap 20 (*)    All other components within normal limits  CBG MONITORING, ED - Abnormal; Notable for the following components:   Glucose-Capillary 202 (*)    All other components within normal limits  I-STAT VENOUS BLOOD GAS, ED - Abnormal; Notable for the following components:   pO2, Ven 29 (*)    Bicarbonate 33.1 (*)    TCO2 35 (*)    Acid-Base Excess 7.0 (*)    All other components within normal limits  CBC WITH DIFFERENTIAL/PLATELET  LIPASE, BLOOD  LACTIC ACID, PLASMA  URINALYSIS, ROUTINE W REFLEX MICROSCOPIC  HCG, QUANTITATIVE, PREGNANCY    EKG: None  Radiology: No results found.  {Document cardiac monitor, telemetry assessment procedure when appropriate:32947} Procedures   Medications Ordered in the ED  droperidol  (INAPSINE ) 2.5 MG/ML injection 1.25 mg (has no administration in time range)  lactated ringers  bolus 1,000 mL (has no administration in time range)  lactated ringers  bolus 1,000 mL (1,000 mLs Intravenous New Bag/Given 04/24/24 1753)  metoCLOPramide  (REGLAN ) injection 10 mg (10 mg Intravenous Given 04/24/24 1753)  diphenhydrAMINE  (BENADRYL ) injection 25 mg (25 mg Intravenous Given 04/24/24 1753)      {Click here for ABCD2, HEART and other calculators REFRESH Note before signing:1}                              Medical Decision Making Amount and/or Complexity of Data Reviewed Labs:  ordered.  Risk Prescription drug management.   ***  {Document critical care time when appropriate  Document review of labs and clinical decision tools ie CHADS2VASC2, etc  Document your independent review of radiology images and any outside records  Document your discussion with family members, caretakers and with consultants  Document social determinants of health affecting pt's care  Document your decision making why or why not admission, treatments were needed:32947:::1}   Final diagnoses:  None    ED Discharge Orders     None

## 2024-04-24 NOTE — ED Triage Notes (Signed)
 Patient states vomiting with abdominal pain since yesterday.

## 2024-04-25 ENCOUNTER — Observation Stay (HOSPITAL_COMMUNITY)

## 2024-04-25 DIAGNOSIS — R112 Nausea with vomiting, unspecified: Secondary | ICD-10-CM | POA: Diagnosis present

## 2024-04-25 DIAGNOSIS — R110 Nausea: Secondary | ICD-10-CM | POA: Diagnosis not present

## 2024-04-25 DIAGNOSIS — K3184 Gastroparesis: Secondary | ICD-10-CM

## 2024-04-25 DIAGNOSIS — Z743 Need for continuous supervision: Secondary | ICD-10-CM | POA: Diagnosis not present

## 2024-04-25 DIAGNOSIS — R1084 Generalized abdominal pain: Secondary | ICD-10-CM | POA: Diagnosis not present

## 2024-04-25 LAB — LACTIC ACID, PLASMA
Lactic Acid, Venous: 1.5 mmol/L (ref 0.5–1.9)
Lactic Acid, Venous: 1.7 mmol/L (ref 0.5–1.9)

## 2024-04-25 LAB — TSH: TSH: 2.214 u[IU]/mL (ref 0.350–4.500)

## 2024-04-25 LAB — BASIC METABOLIC PANEL WITH GFR
Anion gap: 17 — ABNORMAL HIGH (ref 5–15)
BUN: 6 mg/dL (ref 6–20)
CO2: 24 mmol/L (ref 22–32)
Calcium: 9.3 mg/dL (ref 8.9–10.3)
Chloride: 93 mmol/L — ABNORMAL LOW (ref 98–111)
Creatinine, Ser: 0.6 mg/dL (ref 0.44–1.00)
GFR, Estimated: >60 mL/min (ref >60)
Glucose, Bld: 235 mg/dL — ABNORMAL HIGH (ref 70–99)
Potassium: 3.4 mmol/L — ABNORMAL LOW (ref 3.5–5.1)
Sodium: 134 mmol/L — ABNORMAL LOW (ref 135–145)

## 2024-04-25 LAB — URINALYSIS, ROUTINE W REFLEX MICROSCOPIC
Bilirubin Urine: NEGATIVE
Glucose, UA: 500 mg/dL — AB
Ketones, ur: >80 mg/dL — AB
Leukocytes,Ua: NEGATIVE
Nitrite: NEGATIVE
Protein, ur: >300 mg/dL — AB
Specific Gravity, Urine: 1.026 (ref 1.005–1.030)
pH: 7 (ref 5.0–8.0)

## 2024-04-25 LAB — GLUCOSE, CAPILLARY
Glucose-Capillary: 230 mg/dL — ABNORMAL HIGH (ref 70–99)
Glucose-Capillary: 216 mg/dL — ABNORMAL HIGH (ref 70–99)
Glucose-Capillary: 181 mg/dL — ABNORMAL HIGH (ref 70–99)
Glucose-Capillary: 181 mg/dL — ABNORMAL HIGH (ref 70–99)

## 2024-04-25 LAB — URINE DRUG SCREEN
Amphetamines: NEGATIVE — AB
Barbiturates: NEGATIVE — AB
Benzodiazepines: NEGATIVE — AB
Cocaine: NEGATIVE — AB
Fentanyl: NEGATIVE
Methadone Scn, Ur: NEGATIVE — AB
Opiates: NEGATIVE — AB
Tetrahydrocannabinol: POSITIVE — AB

## 2024-04-25 LAB — CBC
HCT: 39.9 % (ref 36.0–46.0)
Hemoglobin: 13.6 g/dL (ref 12.0–15.0)
MCH: 30.8 pg (ref 26.0–34.0)
MCHC: 34.1 g/dL (ref 30.0–36.0)
MCV: 90.3 fL (ref 80.0–100.0)
Platelets: 353 K/uL (ref 150–400)
RBC: 4.42 MIL/uL (ref 3.87–5.11)
RDW: 13.2 % (ref 11.5–15.5)
WBC: 9 K/uL (ref 4.0–10.5)
nRBC: 0 % (ref 0.0–0.2)

## 2024-04-25 LAB — PHOSPHORUS: Phosphorus: 2.3 mg/dL — ABNORMAL LOW (ref 2.5–4.6)

## 2024-04-25 LAB — HEMOGLOBIN A1C
Hgb A1c MFr Bld: 7.9 % — ABNORMAL HIGH (ref 4.8–5.6)
Mean Plasma Glucose: 180.03 mg/dL

## 2024-04-25 LAB — HIV ANTIBODY (ROUTINE TESTING W REFLEX): HIV Screen 4th Generation wRfx: NONREACTIVE

## 2024-04-25 LAB — CBG MONITORING, ED: Glucose-Capillary: 195 mg/dL — ABNORMAL HIGH (ref 70–99)

## 2024-04-25 LAB — MAGNESIUM: Magnesium: 1.7 mg/dL (ref 1.7–2.4)

## 2024-04-25 MED ORDER — PROCHLORPERAZINE EDISYLATE 10 MG/2ML IJ SOLN
10.0000 mg | Freq: Four times a day (QID) | INTRAMUSCULAR | Status: DC | PRN
  Administered 2024-04-25 (×3): 10 mg via INTRAVENOUS
  Filled 2024-04-25 (×3): qty 2

## 2024-04-25 MED ORDER — INSULIN GLARGINE 100 UNIT/ML ~~LOC~~ SOLN
30.0000 [IU] | Freq: Two times a day (BID) | SUBCUTANEOUS | Status: DC
  Administered 2024-04-25 – 2024-04-26 (×3): 30 [IU] via SUBCUTANEOUS
  Filled 2024-04-25 (×4): qty 0.3

## 2024-04-25 MED ORDER — ONDANSETRON HCL 4 MG/2ML IJ SOLN
4.0000 mg | Freq: Four times a day (QID) | INTRAMUSCULAR | Status: DC | PRN
  Administered 2024-04-25 (×3): 4 mg via INTRAVENOUS
  Filled 2024-04-25 (×3): qty 2

## 2024-04-25 MED ORDER — ALBUTEROL SULFATE (2.5 MG/3ML) 0.083% IN NEBU: 2.5000 mg | INHALATION_SOLUTION | RESPIRATORY_TRACT | Status: DC | PRN

## 2024-04-25 MED ORDER — LACTATED RINGERS IV SOLN: INTRAVENOUS | Status: AC

## 2024-04-25 MED ORDER — MELATONIN 3 MG PO TABS: 6.0000 mg | ORAL_TABLET | Freq: Every evening | ORAL | Status: DC | PRN

## 2024-04-25 MED ORDER — POLYETHYLENE GLYCOL 3350 17 G PO PACK: 17.0000 g | PACK | Freq: Every day | ORAL | Status: DC | PRN

## 2024-04-25 MED ORDER — ALUM & MAG HYDROXIDE-SIMETH 200-200-20 MG/5ML PO SUSP: 30.0000 mL | Freq: Four times a day (QID) | ORAL | Status: DC | PRN

## 2024-04-25 MED ORDER — DIAZEPAM 5 MG/ML IJ SOLN
5.0000 mg | Freq: Once | INTRAMUSCULAR | Status: AC
  Administered 2024-04-25: 5 mg via INTRAVENOUS
  Filled 2024-04-25: qty 2

## 2024-04-25 MED ORDER — METOCLOPRAMIDE HCL 5 MG/ML IJ SOLN
10.0000 mg | Freq: Three times a day (TID) | INTRAMUSCULAR | Status: DC
  Administered 2024-04-25 – 2024-04-26 (×4): 10 mg via INTRAVENOUS
  Filled 2024-04-25 (×4): qty 2

## 2024-04-25 MED ORDER — PANTOPRAZOLE SODIUM 40 MG IV SOLR
40.0000 mg | Freq: Two times a day (BID) | INTRAVENOUS | Status: DC
  Administered 2024-04-25 – 2024-04-26 (×3): 40 mg via INTRAVENOUS
  Filled 2024-04-25 (×3): qty 10

## 2024-04-25 MED ORDER — ACETAMINOPHEN 10 MG/ML IV SOLN
1000.0000 mg | Freq: Four times a day (QID) | INTRAVENOUS | Status: AC | PRN
  Administered 2024-04-25: 1000 mg via INTRAVENOUS
  Filled 2024-04-25: qty 100

## 2024-04-25 MED ORDER — SUCRALFATE 1 GM/10ML PO SUSP
1.0000 g | Freq: Three times a day (TID) | ORAL | Status: DC
  Administered 2024-04-25 (×4): 1 g via ORAL
  Filled 2024-04-25 (×4): qty 10

## 2024-04-25 MED ORDER — INSULIN ASPART 100 UNIT/ML IJ SOLN
0.0000 [IU] | Freq: Three times a day (TID) | INTRAMUSCULAR | Status: DC
  Administered 2024-04-25 (×2): 3 [IU] via SUBCUTANEOUS
  Administered 2024-04-25: 2 [IU] via SUBCUTANEOUS
  Administered 2024-04-26: 1 [IU] via SUBCUTANEOUS
  Administered 2024-04-26: 2 [IU] via SUBCUTANEOUS

## 2024-04-25 MED ORDER — KETOROLAC TROMETHAMINE 15 MG/ML IJ SOLN
15.0000 mg | Freq: Four times a day (QID) | INTRAMUSCULAR | Status: DC | PRN
  Administered 2024-04-25: 15 mg via INTRAVENOUS
  Filled 2024-04-25: qty 1

## 2024-04-25 MED ORDER — SODIUM CHLORIDE 0.9% FLUSH
3.0000 mL | Freq: Two times a day (BID) | INTRAVENOUS | Status: DC
  Administered 2024-04-25 – 2024-04-26 (×2): 3 mL via INTRAVENOUS

## 2024-04-25 MED ORDER — ZOLPIDEM TARTRATE 5 MG PO TABS
5.0000 mg | ORAL_TABLET | Freq: Every evening | ORAL | Status: DC | PRN
  Administered 2024-04-25 (×2): 5 mg via ORAL
  Filled 2024-04-25 (×2): qty 1

## 2024-04-25 MED ORDER — ACETAMINOPHEN 500 MG PO TABS: 1000.0000 mg | ORAL_TABLET | Freq: Four times a day (QID) | ORAL | Status: DC | PRN

## 2024-04-25 MED ORDER — ENOXAPARIN SODIUM 40 MG/0.4ML IJ SOSY
40.0000 mg | PREFILLED_SYRINGE | INTRAMUSCULAR | Status: DC
  Administered 2024-04-25 – 2024-04-26 (×2): 40 mg via SUBCUTANEOUS
  Filled 2024-04-25 (×2): qty 0.4

## 2024-04-25 NOTE — Plan of Care (Signed)

## 2024-04-25 NOTE — ED Provider Notes (Signed)
 I have discussed the case with Dr. Marcene of [facility], who agrees to admit the patient.   Raford Lenis, MD 04/25/24 [phone]

## 2024-04-25 NOTE — Hospital Course (Signed)
 Same day note  [name] is a 38 y.o. 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  Pain worse with oral intake.  Last bowel movement 2 days prior to presentation.  Denies any ongoing THC use, reports last about 1 month ago. Reports adherence with insulin  despite sparse fills of Lantus ; reports taking 40 U / 40 U in AM / PM last dose in the AM, and uses novolog  on a sliding scale.   Patient seen and examined at bedside.  Patient was admitted to the hospital for  At the time of my evaluation, patient complains of Physical examination reveals  Laboratory data and imaging was reviewed  Assessment and Plan.  Intractable N/V  Labs were notable for ketosis secondary to starvation.  THC positive.  Continue symptomatic care with IV fluid hydration Reglan  Zofran  Compazine  GI cocktail and sucralfate .  Has had chronic opiate use 03/10/2024.  Currently on clears.   Volume depletion/ Lactic acidosis  Secondary to intractable nausea vomiting.  Lactate has normalized at this time.   Type 1 DM, with hyperglycemia  Reports taking Lantus  40 U BID despite last fill in 6/'25 for 30 day supply, and Novolog  via sliding scale (is filling this regularly).  Continue basal insulin .  Closely monitor while in the hospital  Insomnia Ambien  as needed for sleep.  Diabetic neuropathy: Noted   Cannabis use disorder: Counseled on cessation   GERD/Esophagitis: Continue PPI  Hx high grade vulvar dysplasia s/p ablation: f/u gyn outpatient   Mood d/o: Continue home Duloxetine      No Charge  Signed,  Vernal Anselm Alstrom, MD [facility]

## 2024-04-25 NOTE — Progress Notes (Signed)
 Same day note  [name] is a 38 y.o. 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  Pain worse with oral intake.  Last bowel movement 2 days prior to presentation.  Denies any ongoing THC use, reports last about 1 month ago. Reports adherence with insulin  despite sparse fills of Lantus ; reports taking 40 U / 40 U in AM / PM last dose in the AM, and uses novolog  on a sliding scale.   Patient seen and examined at bedside.  Patient was admitted to the hospital for nausea vomiting  At the time of my evaluation, patient complains of nausea.  Has mild abdominal pain.  Physical examination reveals obese build female, nonspecific tenderness on abdomen.  Laboratory data and imaging was reviewed  Assessment and Plan.  Intractable N/V  Labs were notable for ketosis secondary to starvation.  THC positive.  Continue symptomatic care with IV fluid hydration Reglan  Zofran  Compazine  GI cocktail and sucralfate .  Has had chronic opiate use 03/10/2024.  Currently on clears.  Denies using THC currently.   Volume depletion/ Lactic acidosis  Secondary to intractable nausea vomiting.  Lactate has normalized at this time.   Type 1 DM, with hyperglycemia  Reports taking Lantus  40 U BID despite last fill in 6/'25 for 30 day supply, and Novolog  via sliding scale (is filling this regularly).  Continue basal insulin .  Closely monitor while in the hospital.  Hemoglobin A1c at this time at 7.9 improved from before.  Insomnia Ambien  as needed for sleep.  Diabetic neuropathy: Noted   Cannabis use disorder: Counseled on cessation   GERD/Esophagitis: Continue PPI  Hx high grade vulvar dysplasia s/p ablation: f/u gyn outpatient   Mood d/o: Continue home Duloxetine       No Charge  Signed,  Vernal Anselm Alstrom, MD [facility]

## 2024-04-25 NOTE — Progress Notes (Signed)
 Hospitalist Transfer Note:    Nursing staff, Please call [facility] Admits & Consults System-Wide number on Amion [phone]) as soon as patient's arrival, so appropriate admitting provider can evaluate the pt.   Transferring facility: [facility] Requesting provider: Dr. Raford (EDP at [facility]) Reason for transfer: admission for further evaluation and management of intractable nausea/vomiting.   81 F  w/ h/o DM1 complicated by peripheral polyneuropathy and diabetic gastroparesis, who presented to [facility] ED complaining of recurrent nausea/vomiting over the last few days, which has been refractory to multiple doses of IV antiemetics  administered via drawbridge this evening.  This is also been associate with some mild abdominal discomfort.  Vital signs in the ED were notable for the following: Afebrile; heart rates initially in the 120s to 130s, subsequently improving into the 90s to low 100s with interval IV fluids.   Labs were notable for initial CBG 215, with repeat trending down to 195 following interval IV fluids.  CMP notable for bicarbonate 26, with anion gap of 20, mildly elevated calcium , and no evidence of transaminitis.  Lipase 15.  VBG notable for 7.422/51.09/17/31.1.  Initial lactate 2.6, which is trended down to 1.5 with interval IV fluids. WBC 7800. UDS positive for THC.  No imaging performed in the ED today.  Medications administered prior to transfer included the following: Single doses of Valium , IV Benadryl , droperidol , Reglan , and Dilaudid .  She has also received a total of 3 L of lactated Ringer 's.   Subsequently, I accepted this patient for transfer for observation to a med/tele bed at [facility] or [facility] (first available) for further work-up and management of the above.      Eva Pore, DO Hospitalist

## 2024-04-25 NOTE — H&P (Signed)
 History and Physical    [name] FMW:[id number] DOB: [birth year] DOA: 04/24/2024  PCP: Cristopher Suzen HERO, NP   Patient coming from: Tx from [facility] ED    Chief Complaint:  Chief Complaint  Patient presents with   Emesis    HPI:  [name] is a 38 y.o. 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 Reports that symptoms started on Friday 9/5, with diffuse abd pain which is worse in the epigastrium. And persistent nausea, with regurgitation with any attempt at PO intake. Pain worse with PO intake. Denies any black / blood in emesis / stool. Last BM was 2 days ago. She denies any fever, chills. No sick contacts. No abnormal food exposures. Denies any ongoing THC use, reports last about 1 month ago. Reports adherence with insulin  despite sparse fills of Lantus ; reports taking 40 U / 40 U in AM / PM last dose in the AM, and uses novolog  on a sliding scale.    Review of Systems:  ROS complete and negative except as marked above   Allergies  Allergen Reactions   Mirapex [Pramipexole] Shortness Of Breath    Prior to Admission medications   Medication Sig Start Date End Date Taking? Authorizing Provider  Acetaminophen  (MIDOL  PO) Take 1 tablet by mouth daily as needed (cramping).    [provider]  capsicum (ZOSTRIX) 0.075 % topical cream Apply topically 2 (two) times daily. To abdomen 01/19/24   Juvenal Raisin U, DO  Continuous Blood Gluc Transmit (DEXCOM G6 TRANSMITTER) MISC Inject 1 each into the skin every 14 (fourteen) days. 12/31/21   Lucius Krabbe, NP  Continuous Glucose Sensor (DEXCOM G7 SENSOR) MISC Inject 1 Device into the skin See admin instructions. Place 1 new sensor into the skin every 10 days    [provider]  DRYSOL 20 % external solution Apply 1 Application topically 2 (two)  times a week. 12/16/23   [provider]  DULoxetine  (CYMBALTA ) 60 MG capsule Take 60 mg by mouth at bedtime. 06/28/22   [provider]  insulin  aspart (NOVOLOG ) 100 UNIT/ML FlexPen Inject 0-9 Units into the skin 3 (three) times daily with meals. CBG < 70: Eat or drink something sweet and recheck, CBG 70 - 120: 0 units CBG 121 - 150: 1 unit CBG 151 - 200: 2 units CBG 201 - 250: 3 units CBG 251 - 300: 5 units CBG 301 - 350: 7 units CBG 351 - 400: 9 units CBG > 400: call MD. Patient taking differently: Inject 0-9 Units into the skin See admin instructions. Inject 0-9 units into the skin 3 times a day with meals, PER SLIDING SCALE: CBG < 70: Eat or drink something sweet and recheck, CBG 70 - 120: 0 units CBG 121 - 150: 1 unit CBG 151 - 200: 2 units CBG 201 - 250: 3 units CBG 251 - 300: 5 units CBG 301 - 350: 7 units CBG 351 - 400: 9 units CBG > 400: call MD 08/07/23 01/18/24  [name], MD  insulin  glargine (LANTUS ) 100 UNIT/ML Solostar Pen Inject 12 Units into the skin daily. May substitute as needed per insurance. 10/15/23 01/18/24  [place], [name], DO  Insulin  Pen Needle (B-D UF III MINI PEN NEEDLES) 31G X 5 MM MISC To use with Insulin  pens, as directed 08/07/23   [place], [employer]  D, MD  Insulin  Pen Needle (PEN NEEDLES) 31G X 5 MM MISC 1 each by Does not apply route 3 (three) times daily. May dispense any manufacturer covered by patient's insurance. 10/15/23   [place], Camellia PARAS, DO  metoCLOPramide  (REGLAN ) 10 MG tablet Take 1 tablet (10 mg total) by mouth every 6 (six) hours as needed for nausea or vomiting. 01/17/24   Jerral Meth, MD  NOVOLOG  PENFILL cartridge PLEASE SEE ATTACHED FOR DETAILED DIRECTIONS 01/26/24   [provider]  ondansetron  (ZOFRAN ) 4 MG tablet Take 1 tablet (4 mg total) by mouth every 6 (six) hours. 03/20/24   Jerral Meth, MD  oxyCODONE  (ROXICODONE ) 15 MG immediate release tablet Take 15 mg by mouth 4 (four) times daily.    [provider]   Oxycodone  HCl 10 MG TABS Take 10 mg by mouth 4 (four) times daily. 02/08/24   [provider]  pantoprazole  (PROTONIX ) 20 MG tablet Take 1 tablet (20 mg total) by mouth daily. 03/27/24   Francesca Elsie CROME, MD  prochlorperazine  (COMPAZINE ) 25 MG suppository Place 1 suppository (25 mg total) rectally every 12 (twelve) hours as needed for refractory nausea / vomiting. 01/19/24   Vann, Jessica U, DO  promethazine  (PHENERGAN ) 25 MG suppository Place 1 suppository (25 mg total) rectally every 6 (six) hours as needed for nausea or vomiting. 01/17/24   Jerral Meth, MD  promethazine  (PHENERGAN ) 25 MG suppository Place 1 suppository (25 mg total) rectally every 6 (six) hours as needed for nausea or vomiting. 02/22/24   Bari [name], MD    Past Medical History:  Diagnosis Date   Chronic constipation 11/08/2021   Diabetic neuropathy (HCC)    History of chicken pox    History of noncompliance with medical treatment 09/07/2022   Type 1 diabetes (HCC)    Vaginal odor 11/08/2021    Past Surgical History:  Procedure Laterality Date   CESAREAN SECTION     only one C/S per pt.   CO2 LASER APPLICATION N/A 02/22/2022   Procedure: CO2 LASER APPLICATION TO VULVA;  Surgeon: Viktoria Comer SAUNDERS, MD;  Location: [facility];  Service: General;  Laterality: N/A;   DILATION AND CURETTAGE OF UTERUS     EXCISION OF SKIN TAG N/A 02/22/2022   Procedure: EXCISION OF ANAL SKIN TAGS;  Surgeon: Debby Hila, MD;  Location: [facility];  Service: General;  Laterality: N/A;     reports that she has never smoked. She has never been exposed to tobacco smoke. She has never used smokeless tobacco. She reports that she does not drink alcohol and does not use drugs.  Family History  Problem Relation Age of Onset   Hypertension Mother    Glaucoma Mother    Diabetes Maternal Grandmother    Neuropathy Neg Hx    Colon cancer Neg Hx    Stomach cancer Neg Hx    Esophageal cancer Neg Hx     Colon polyps Neg Hx    Rectal cancer Neg Hx      Physical Exam: Vitals:   04/25/24 0100 04/25/24 0130 04/25/24 0215 04/25/24 0315  BP: 129/76 129/85 136/83 (!) 179/110  Pulse: 99 (!) 102 99 (!) 112  Resp: 15 15 14 16   Temp:    98.4 F (36.9 C)  TempSrc:    Oral  SpO2: 100% 100% 99% 100%  Weight:    75.1 kg  Height:    5' 6 (1.676 m)    Gen: Awake, alert, NAD  CV: Regular, normal S1, S2, no murmurs  Resp: Normal WOB, CTAB  Abd: Flat, normoactive, mild tenderness in the epigastrium and RUQ  MSK: Symmetric, no edema  Skin: No rashes or lesions to exposed skin  Neuro: Alert and interactive  Psych: euthymic, appropriate    Data review:   Labs reviewed, notable for:   BG 216  VBG 7.42 / 51  Bicarb 26, AG 20. Lactate 2.6 -> 1.5 with IVF  WBC 7  UA +>80 mg/dl ketone  Tox + THC  Serum hcg neg   Micro:  Results for orders placed or performed during the hospital encounter of 10/12/23  MRSA Next Gen by PCR, Nasal     Status: Abnormal   Collection Time: 10/12/23  7:00 PM   Specimen: Nasal Mucosa; Nasal Swab  Result Value Ref Range Status   MRSA by PCR Next Gen DETECTED (A) NOT DETECTED Final    Comment: CRITICAL RESULT CALLED TO, READ BACK BY AND VERIFIED WITH: DICKEY MYLAR, RN @ [address] [facility] 10/12/23 (NOTE) The GeneXpert MRSA Assay (FDA approved for NASAL specimens only), is one component of a comprehensive MRSA colonization surveillance program. It is not intended to diagnose MRSA infection nor to guide or monitor treatment for MRSA infections. Test performance is not FDA approved in patients less than 64 years old. Performed at [facility], [address], [place], [place] [address]     Imaging reviewed:  No results found.  EKG:  Reviewed ST, RSR' in V2, no acute ischemic changes.   ED Course:  Treated with Reglan , benadryl  IV, droperidol , Dilaudid , and Valium , 3 L IVF.    Assessment/Plan:  38 y.o. 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 Initial presentation with tachycardia into 130s, improving with IVF. Labs notable for ketosis likely starvation related, not consistent with DKA. And other signs of dehydration, mild hyperCa, hyaline cast in urine, lactic acidosis. LFT / lipase, hcg were neg. THC +. No imaging completed this admission; she has recent CT within 1 month demonstrating signs of esophagitis.   -- Supportive care, continue LR at 100 cc /hr until taking adequate PO. CLD for now  -- Symptomatic management: Schedule reglan , zofran  / compazine  2nd line, Gi cocktail prn, schedule sucralfate   -- Avoid opiate therapy as will exacerbate issues with gastroparesis.; She was previously on chronic opiate with Oxycodone  but last fill on 7/23 for 14 day supply. Send urine opiate quant and fentanyl  screen (did receive dilaudid  at OSH).  -- Counseled on abstinence from [facility] products, glycemic control to help improve gastroparesis over time.  -- Should have GI followup as outpatient.   Dehydration  Lactic acidosis  -- IVF per above, lactate now wnl   Type 1 DM, with hyperglycemia  Reports taking Lantus  40 U BID despite last fill in 6/'25 for 30 day supply, and Novolog  via sliding scale (is filling this regularly). DM has been chronically uncontrolled   -- Restart on home basal at reduced dose with likely nonadherence. Reduce to 30 units BID. and add SSI for sensitive.  -- Check A1c  -- DM educator consult    Insomnia -- Requesting ambien  for sleep, ordered   Chronic medical problems:  Neuropathy: Noted  Cannabis use: Counseled on cessation  GERD/Esophagitis: see above, switched PPI  to IV for now  Hx high grade vulvar dysplasia s/p ablation: f/u gyn  outpatient  Mood d/o: Continue home Duloxetine     Body mass index is 26.72 kg/m.    DVT prophylaxis:  Lovenox  Code Status:  Full Code Diet:  Diet Orders (From admission, onward)    None      Family Communication:  None   Consults:  None   Admission status:   Observation, Telemetry bed  Severity of Illness: The appropriate patient status for this patient is OBSERVATION. Observation status is judged to be reasonable and necessary in order to provide the required intensity of service to ensure the patient's safety. The patient's presenting symptoms, physical exam findings, and initial radiographic and laboratory data in the context of their medical condition is felt to place them at decreased risk for further clinical deterioration. Furthermore, it is anticipated that the patient will be medically stable for discharge from the hospital within 2 midnights of admission.    Dorn Dawson, MD [facility]  How to contact the [facility] Attending or Consulting provider 7A - 7P or covering provider during after hours 7P -7A, for this patient.  Check the care team in [facility] and look for a) attending/consulting [facility] provider listed and b) the [facility] team listed Log into [email or web address] and use [facility]'s universal password to access. If you do not have the password, please contact the hospital operator. Locate the [facility] provider you are looking for under [facility] and page to a number that you can be directly reached. If you still have difficulty reaching the provider, please page the [facility] (Director on Call) for the Hospitalists listed on amion for assistance.  04/25/2024, 3:27 AM

## 2024-04-25 NOTE — Progress Notes (Signed)
 TRH night cross cover note:   Patient's RN notified me that pt is complaining of nausea, with admit orders pending. I placed preliminary order for prn iv zofran  for nausea, pending her admission orders.     Eva Pore, DO Hospitalist

## 2024-04-25 NOTE — Progress Notes (Signed)
 TRH night cross cover note:   I was notified by the patient's RN of the patient's request for additional pain medication for her abdominal discomfort prn existing order for as needed acetaminophen .  After brief chart review, I have added as needed IV Toradol  for pain.     Eva Pore, DO Hospitalist

## 2024-04-26 ENCOUNTER — Other Ambulatory Visit (HOSPITAL_COMMUNITY): Payer: Self-pay

## 2024-04-26 DIAGNOSIS — R112 Nausea with vomiting, unspecified: Secondary | ICD-10-CM | POA: Diagnosis not present

## 2024-04-26 LAB — GLUCOSE, CAPILLARY
Glucose-Capillary: 176 mg/dL — ABNORMAL HIGH (ref 70–99)
Glucose-Capillary: 122 mg/dL — ABNORMAL HIGH (ref 70–99)

## 2024-04-26 MED ORDER — BISACODYL 5 MG PO TBEC
10.0000 mg | DELAYED_RELEASE_TABLET | Freq: Once | ORAL | Status: AC
  Administered 2024-04-26: 10 mg via ORAL
  Filled 2024-04-26: qty 2

## 2024-04-26 MED ORDER — PANTOPRAZOLE SODIUM 40 MG PO TBEC: 40.0000 mg | DELAYED_RELEASE_TABLET | Freq: Every day | ORAL | 1 refills | Status: DC

## 2024-04-26 MED ORDER — POLYETHYLENE GLYCOL 3350 17 G PO PACK: 17.0000 g | PACK | Freq: Every day | ORAL | 0 refills | Status: DC | PRN

## 2024-04-26 MED ORDER — LACTATED RINGERS IV SOLN: INTRAVENOUS | Status: DC

## 2024-04-26 MED ORDER — METOCLOPRAMIDE HCL 10 MG PO TABS
10.0000 mg | ORAL_TABLET | Freq: Four times a day (QID) | ORAL | 0 refills | Status: DC | PRN
Start: 2024-04-26 — End: 2024-05-24
  Filled 2024-04-26: qty 30, 8d supply, fill #0

## 2024-04-26 NOTE — Progress Notes (Signed)
 DISCHARGE NOTE HOME [name] Lashaunda Schild to be discharged Home per MD order. Discussed prescriptions and follow up appointments with the patient. Prescriptions given to patient; medication list explained in detail. Patient verbalized understanding.  Skin clean, dry and intact without evidence of skin break down, no evidence of skin tears noted. IV catheter discontinued intact. Site without signs and symptoms of complications. Dressing and pressure applied. Pt denies pain at the site currently. No complaints noted.  Patient free of lines, drains, and wounds.   An After Visit Summary (AVS) was printed and given to the patient. Patient escorted via wheelchair, and discharged home via private auto.  Peyton SHAUNNA Pepper, RN

## 2024-04-26 NOTE — Inpatient Diabetes Management (Signed)
 Inpatient Diabetes Program Recommendations  AACE/ADA: New Consensus Statement on Inpatient Glycemic Control (2015)  Target Ranges:  Prepandial:   less than 140 mg/dL      Peak postprandial:   less than 180 mg/dL (1-2 hours)      Critically ill patients:  140 - 180 mg/dL   Lab Results  Component Value Date   GLUCAP 122 (H) 04/26/2024   HGBA1C 7.9 (H) 04/25/2024    Review of Glycemic Control  Latest Reference Range & Units 04/25/24 12:10 04/25/24 16:55 04/25/24 21:07 04/26/24 05:53 04/26/24 11:17  Glucose-Capillary 70 - 99 mg/dL 783 (H) 818 (H) 818 (H) 176 (H) 122 (H)   Diabetes history: DM 2 Outpatient Diabetes medications:  Lantus  40 units bid Novolog - SSI Current orders for Inpatient glycemic control:  Novolog  0-9 units tid with meals  Lantus  30 units bid  Inpatient Diabetes Program Recommendations:    Spoke with patient regarding A1C of 7.9%.  Patient is very proud that blood sugars are doing better.  She wears sensor at home and states that she is not having lows.  Congratulated her.  She states she feels much better today.   Thanks,  Randall Bullocks, RN, BC-ADM Inpatient Diabetes Coordinator Pager [phone]  (8a-5p)

## 2024-04-26 NOTE — Discharge Summary (Signed)
 Physician Discharge Summary  [name] FMW:[id number] DOB: 31-May-1986 DOA: 04/24/2024  PCP: Cristopher Suzen HERO, NP  Admit date: 04/24/2024 Discharge date: 04/26/2024  Admitted From: Home  Discharge disposition: Home   Recommendations for Outpatient Follow-Up:   Follow up with your primary care provider in one week.  Check CBC, BMP, magnesium  in the next visit   Discharge Diagnosis:   Principal Problem:   Intractable nausea and vomiting   Discharge Condition: Improved.  Diet recommendation:  Carbohydrate-modified.  Small portion, low residue diet.  Wound care: None.  Code status: Full.   History of Present Illness:   [name] is a 38 y.o. 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  Pain worse with oral intake.  Last bowel movement 2 days prior to presentation.  Denies any ongoing THC use, reports last about 1 month ago. Reports adherence with insulin  despite sparse fills of Lantus ; reports taking 40 U / 40 U in AM / PM last dose in the AM, and uses novolog  on a sliding scale.    Hospital Course:   Following conditions were addressed during hospitalization as listed below,  Intractable N/V  Labs were notable for ketosis secondary to starvation.  THC positive.  Patient received symptomatic care with IV fluid hydration Reglan  Zofran  Compazine  GI cocktail and sucralfate .  Has had chronic opiate use 03/10/2024.  Denies using THC recently though was positive.  Counseled about it.  Advised gastroparesis diet/small frequent low-fat diet.  At this time patient has tolerated oral diet.  Volume depletion/Lactic acidosis  Secondary to intractable nausea vomiting.  Lactate has normalized at this time.  Hydration status  has improved   Type 1 DM, with hyperglycemia  To resume home insulin  regimen on discharge.  Hemoglobin A1c at this time at 7.9 improved from before.   Insomnia Diabetic neuropathy: Noted    Cannabis use disorder: Counseled on cessation.  States that she has quit doing it for a month   GERD/Esophagitis: Continue Protonix  on discharge   Hx high grade vulvar dysplasia s/p ablation: Follow-up with gynecology as outpatient.   Mood disorder continue home Duloxetine     Disposition.  At this time, patient is stable for disposition home with outpatient PCP follow-up  Medical Consultants:   None.  Procedures:    None Subjective:   Today, patient was seen and examined.  Has felt better with no nausea, vomiting- has tolerated oral diet.  Has mild abdominal discomfort.   Discharge Exam:   Vitals:   04/26/24 0848 04/26/24 1116  BP: (!) 152/104 (!) 149/102  Pulse: (!) 102   Resp: 20 19  Temp: 98.6 F (37 C) 98.7 F (37.1 C)  SpO2: 98%    Vitals:   04/26/24 0404 04/26/24 0555 04/26/24 0848 04/26/24 1116  BP: (!) 166/100  (!) 152/104 (!) 149/102  Pulse: (!) 104  (!) 102   Resp: 18  20 19   Temp: 98.7 F (37.1 C)  98.6 F (37 C) 98.7 F (37.1 C)  TempSrc: Oral  Oral Oral  SpO2: 99%  98%   Weight:  74.2 kg    Height:       Body mass index is 26.41 kg/m.   General: Alert awake, not in obvious distress HENT: pupils equally reacting to light,  No scleral  pallor or icterus noted. Oral mucosa is moist.  Chest:  Clear breath sounds. . No crackles or wheezes.  CVS: S1 &S2 heard. No murmur.  Regular rate and rhythm. Abdomen: Soft, mild abdominal tenderness on palpation.  Nondistended.  Bowel sounds are heard.   Extremities: No cyanosis, clubbing or edema.  Peripheral pulses are palpable. Psych: Alert, awake and oriented, normal mood CNS:  No cranial nerve deficits.  Power equal in all extremities.   Skin: Warm and dry.  No rashes noted.  The results of significant diagnostics  from this hospitalization (including imaging, microbiology, ancillary and laboratory) are listed below for reference.     Diagnostic Studies:   US  Abdomen Limited RUQ (LIVER/GB) Result Date: 04/25/2024 CLINICAL DATA:  Intractable nausea and vomiting. EXAM: ULTRASOUND ABDOMEN LIMITED RIGHT UPPER QUADRANT COMPARISON:  CT with IV contrast 03/27/2024. FINDINGS: Gallbladder: There are multiple layering shadowing stones in the gallbladder, largest measurable is 8 mm. There is no wall thickening or positive sonographic Murphy's sign. No pericholecystic fluid. Common bile duct: Diameter: 3.7 mm.  No intrahepatic bile duct dilatation. Liver: No focal lesion identified. Within normal limits in parenchymal echogenicity. Portal vein is patent on color Doppler imaging with normal direction of blood flow towards the liver. Other: No right upper quadrant ascites. IMPRESSION: Cholelithiasis without sonographic evidence of acute cholecystitis. Electronically Signed   By: Francis Quam M.D.   On: 04/25/2024 07:58   DG Abd 1 View Result Date: 04/25/2024 CLINICAL DATA:  [id number].  Intractable nausea and vomiting. [id number].  Cholelithiasis. EXAM: ABDOMEN - 1 VIEW COMPARISON:  CT with IV contrast 03/27/2024 FINDINGS: The bowel gas pattern is normal. No radio-opaque calculi or other significant radiographic abnormality are seen. IMPRESSION: Negative. Electronically Signed   By: Francis Quam M.D.   On: 04/25/2024 05:48     Labs:   Basic Metabolic Panel: Recent Labs  Lab 04/24/24 1751 04/24/24 1805 04/25/24 0752  NA 141 137 134*  K 3.6 3.8 3.4*  CL 95*  --  93*  CO2 26  --  24  GLUCOSE 216*  --  235*  BUN 9  --  6  CREATININE 0.73  --  0.60  CALCIUM  10.4*  --  9.3  MG 1.9  --  1.7  PHOS  --   --  2.3*   GFR Estimated Creatinine Clearance: 99.3 mL/min (by C-G formula based on SCr of 0.6 mg/dL). Liver Function Tests: Recent Labs  Lab 04/24/24 1751  AST 19  ALT 12  ALKPHOS 105  BILITOT 0.5  PROT 8.3*   ALBUMIN 4.7   Recent Labs  Lab 04/24/24 1751  LIPASE 15   No results for input(s): AMMONIA in the last 168 hours. Coagulation profile No results for input(s): INR, PROTIME in the last 168 hours.  CBC: Recent Labs  Lab 04/24/24 1751 04/24/24 1805 04/25/24 0752  WBC 7.8  --  9.0  NEUTROABS 5.2  --   --   HGB 13.7 14.3 13.6  HCT 39.6 42.0 39.9  MCV 90.6  --  90.3  PLT 379  --  353   Cardiac Enzymes: No results for input(s): CKTOTAL, CKMB, CKMBINDEX, TROPONINI in the last 168 hours. BNP: Invalid input(s): POCBNP CBG: Recent Labs  Lab 04/25/24 1210 04/25/24 1655 04/25/24 2107 04/26/24 0553 04/26/24 1117  GLUCAP 216* 181* 181* 176* 122*   D-Dimer No results for input(s): DDIMER in the last 72 hours. Hgb A1c Recent Labs    04/25/24 0752  HGBA1C 7.9*   Lipid  Profile No results for input(s): CHOL, HDL, LDLCALC, TRIG, CHOLHDL, LDLDIRECT in the last 72 hours. Thyroid  function studies Recent Labs    04/25/24 0534  TSH 2.214   Anemia work up No results for input(s): VITAMINB12, FOLATE, FERRITIN, TIBC, IRON, RETICCTPCT in the last 72 hours. Microbiology No results found for this or any previous visit (from the past 240 hours).   Discharge Instructions:   Discharge Instructions     Call MD for:  persistant nausea and vomiting   Complete by: As directed    Call MD for:  severe uncontrolled pain   Complete by: As directed    Diet - low sodium heart healthy   Complete by: As directed    Discharge instructions   Complete by: As directed    Follow-up with your primary care provider in 1 week.  Check blood work at that time.  Seek medical attention for worsening symptoms.   Increase activity slowly   Complete by: As directed       Allergies as of 04/26/2024       Reactions   Mirapex [pramipexole] Shortness Of Breath        Medication List     TAKE these medications    Dexcom G7 Sensor Misc Inject 1 Device  into the skin See admin instructions. Place 1 new sensor into the skin every 10 days   DULoxetine  60 MG capsule Commonly known as: CYMBALTA  Take 60 mg by mouth at bedtime.   insulin  aspart 100 UNIT/ML FlexPen Commonly known as: NOVOLOG  Inject 0-9 Units into the skin 3 (three) times daily with meals. CBG < 70: Eat or drink something sweet and recheck, CBG 70 - 120: 0 units CBG 121 - 150: 1 unit CBG 151 - 200: 2 units CBG 201 - 250: 3 units CBG 251 - 300: 5 units CBG 301 - 350: 7 units CBG 351 - 400: 9 units CBG > 400: call MD.   insulin  glargine 100 UNIT/ML Solostar Pen Commonly known as: LANTUS  Inject 12 Units into the skin daily. May substitute as needed per insurance. What changed:  how much to take when to take this additional instructions   metoCLOPramide  10 MG tablet Commonly known as: REGLAN  Take 1 tablet (10 mg total) by mouth every 6 (six) hours as needed for nausea or vomiting.   MIDOL  PO Take 1 tablet by mouth every 6 (six) hours as needed (cramping).   ondansetron  4 MG tablet Commonly known as: ZOFRAN  Take 1 tablet (4 mg total) by mouth every 6 (six) hours.   pantoprazole  40 MG tablet Commonly known as: Protonix  Take 1 tablet (40 mg total) by mouth daily.   polyethylene glycol 17 g packet Commonly known as: MIRALAX  / GLYCOLAX  Take 17 g by mouth daily as needed for mild constipation.   promethazine  25 MG suppository Commonly known as: PHENERGAN  Place 1 suppository (25 mg total) rectally every 6 (six) hours as needed for nausea or vomiting.        Follow-up Information     Cristopher Suzen HERO, NP Follow up in 1 week(s).   Contact information: [address] [place] [address] [phone]                  Time coordinating discharge: 39 minutes  Signed:  Kalesha Irving  [facility] 04/26/2024, 4:19 PM

## 2024-05-05 LAB — FENTANYL AND METABOLITE
Fentanyl: NEGATIVE ng/mL (ref 1.0–3.0)
Norfentanyl: NEGATIVE ng/mL

## 2024-05-07 LAB — MISC LABCORP TEST (SEND OUT): Labcorp test code: 9985

## 2024-05-24 ENCOUNTER — Emergency Department (HOSPITAL_BASED_OUTPATIENT_CLINIC_OR_DEPARTMENT_OTHER)
Admission: EM | Admit: 2024-05-24 | Discharge: 2024-05-24 | Disposition: A | Discharge status: Home / Self Care | Attending: Emergency Medicine | Admitting: Emergency Medicine

## 2024-05-24 ENCOUNTER — Encounter (HOSPITAL_BASED_OUTPATIENT_CLINIC_OR_DEPARTMENT_OTHER): Payer: Self-pay | Admitting: Emergency Medicine

## 2024-05-24 ENCOUNTER — Other Ambulatory Visit: Payer: Self-pay

## 2024-05-24 DIAGNOSIS — R112 Nausea with vomiting, unspecified: Secondary | ICD-10-CM | POA: Diagnosis present

## 2024-05-24 DIAGNOSIS — Z794 Long term (current) use of insulin: Secondary | ICD-10-CM | POA: Diagnosis not present

## 2024-05-24 DIAGNOSIS — R531 Weakness: Secondary | ICD-10-CM | POA: Diagnosis not present

## 2024-05-24 DIAGNOSIS — R109 Unspecified abdominal pain: Secondary | ICD-10-CM | POA: Diagnosis not present

## 2024-05-24 DIAGNOSIS — E1043 Type 1 diabetes mellitus with diabetic autonomic (poly)neuropathy: Secondary | ICD-10-CM | POA: Diagnosis not present

## 2024-05-24 LAB — COMPREHENSIVE METABOLIC PANEL WITH GFR
ALT: 27 U/L (ref 0–44)
AST: 24 U/L (ref 15–41)
Albumin: 4.6 g/dL (ref 3.5–5.0)
Alkaline Phosphatase: 126 U/L (ref 38–126)
Anion gap: 15 (ref 5–15)
BUN: 10 mg/dL (ref 6–20)
CO2: 26 mmol/L (ref 22–32)
Calcium: 10.5 mg/dL — ABNORMAL HIGH (ref 8.9–10.3)
Chloride: 96 mmol/L — ABNORMAL LOW (ref 98–111)
Creatinine, Ser: 0.66 mg/dL (ref 0.44–1.00)
GFR, Estimated: >60 mL/min (ref >60)
Glucose, Bld: 208 mg/dL — ABNORMAL HIGH (ref 70–99)
Potassium: 3.8 mmol/L (ref 3.5–5.1)
Sodium: 137 mmol/L (ref 135–145)
Total Bilirubin: 0.5 mg/dL (ref 0.0–1.2)
Total Protein: 8.4 g/dL — ABNORMAL HIGH (ref 6.5–8.1)

## 2024-05-24 LAB — URINALYSIS, ROUTINE W REFLEX MICROSCOPIC
Bacteria, UA: NONE SEEN
Bilirubin Urine: NEGATIVE
Glucose, UA: 500 mg/dL — AB
Ketones, ur: >80 mg/dL — AB
Leukocytes,Ua: NEGATIVE
Nitrite: NEGATIVE
Protein, ur: >300 mg/dL — AB
Specific Gravity, Urine: 1.026 (ref 1.005–1.030)
pH: 7 (ref 5.0–8.0)

## 2024-05-24 LAB — CBC WITH DIFFERENTIAL/PLATELET
Abs Immature Granulocytes: 0.03 K/uL (ref 0.00–0.07)
Basophils Absolute: 0 K/uL (ref 0.0–0.1)
Basophils Relative: 0 %
Eosinophils Absolute: 0 K/uL (ref 0.0–0.5)
Eosinophils Relative: 0 %
HCT: 37.8 % (ref 36.0–46.0)
Hemoglobin: 13.3 g/dL (ref 12.0–15.0)
Immature Granulocytes: 0 %
Lymphocytes Relative: 19 %
Lymphs Abs: 1.6 K/uL (ref 0.7–4.0)
MCH: 31.5 pg (ref 26.0–34.0)
MCHC: 35.2 g/dL (ref 30.0–36.0)
MCV: 89.6 fL (ref 80.0–100.0)
Monocytes Absolute: 0.2 K/uL (ref 0.1–1.0)
Monocytes Relative: 2 %
Neutro Abs: 6.3 K/uL (ref 1.7–7.7)
Neutrophils Relative %: 79 %
Platelets: 343 K/uL (ref 150–400)
RBC: 4.22 MIL/uL (ref 3.87–5.11)
RDW: 13.1 % (ref 11.5–15.5)
WBC: 8.1 K/uL (ref 4.0–10.5)
nRBC: 0 % (ref 0.0–0.2)

## 2024-05-24 LAB — PREGNANCY, URINE: Preg Test, Ur: NEGATIVE

## 2024-05-24 LAB — LIPASE, BLOOD: Lipase: 27 U/L (ref 11–51)

## 2024-05-24 LAB — CBG MONITORING, ED: Glucose-Capillary: 218 mg/dL — ABNORMAL HIGH (ref 70–99)

## 2024-05-24 MED ORDER — PROCHLORPERAZINE EDISYLATE 10 MG/2ML IJ SOLN
10.0000 mg | Freq: Once | INTRAMUSCULAR | Status: AC
  Administered 2024-05-24: 10 mg via INTRAVENOUS
  Filled 2024-05-24: qty 2

## 2024-05-24 MED ORDER — DIPHENHYDRAMINE HCL 50 MG/ML IJ SOLN
12.5000 mg | Freq: Once | INTRAMUSCULAR | Status: AC
  Administered 2024-05-24: 12.5 mg via INTRAVENOUS
  Filled 2024-05-24: qty 1

## 2024-05-24 MED ORDER — METOCLOPRAMIDE HCL 10 MG PO TABS: 10.0000 mg | ORAL_TABLET | Freq: Four times a day (QID) | ORAL | 0 refills | Status: DC

## 2024-05-24 MED ORDER — HYDROMORPHONE HCL 1 MG/ML IJ SOLN
0.5000 mg | Freq: Once | INTRAMUSCULAR | Status: AC
  Administered 2024-05-24: 0.5 mg via INTRAVENOUS
  Filled 2024-05-24: qty 1

## 2024-05-24 MED ORDER — SODIUM CHLORIDE 0.9 % IV BOLUS
1000.0000 mL | Freq: Once | INTRAVENOUS | Status: AC
  Administered 2024-05-24: 1000 mL via INTRAVENOUS

## 2024-05-24 NOTE — ED Notes (Signed)
 Pt reminded we are waiting to receive her urine. Pt states she does not have to go.

## 2024-05-24 NOTE — ED Notes (Signed)
 Reviewed AVS/discharge instructions with patient. Time allotted for and all questions answered. Patient is agreeable for d/c and escorted to ED exit by staff.

## 2024-05-24 NOTE — ED Notes (Signed)
 Patient notified of need for urine sample to complete eval/assessment. Specimen cup provided and instructions for clean catch given. Patient will notify staff when able to provide

## 2024-05-24 NOTE — ED Provider Notes (Signed)
 [facility] DEPARTMENT AT [facility] Provider Note   CSN: [id number] Arrival date & time: 05/24/24  1042     Patient presents with: Weakness   [name] is a 38 y.o. female.   Patient with history of T1DM, diabetic neuropathy, gastroparesis presents today with complaints of nausea and vomiting. Reports symptoms began this morning around 5 am and have been persistent since. Symptoms consistent with gastroparesis flare she has had previously.  Denies diarrhea or constipation.  Is having regular bowel movements and passing flatus.  Reports that Dilaudid  and Compazine  are normally help her symptoms when she has flares.  She did try taking Phenergan  suppositories at home without improvement.  The history is provided by the patient. No language interpreter was used.  Weakness Associated symptoms: abdominal pain, nausea and vomiting        Prior to Admission medications   Medication Sig Start Date End Date Taking? Authorizing Provider  Acetaminophen  (MIDOL  PO) Take 1 tablet by mouth every 6 (six) hours as needed (cramping).    [provider]  Continuous Glucose Sensor (DEXCOM G7 SENSOR) MISC Inject 1 Device into the skin See admin instructions. Place 1 new sensor into the skin every 10 days    [provider]  DULoxetine  (CYMBALTA ) 60 MG capsule Take 60 mg by mouth at bedtime. 06/28/22   [provider]  insulin  aspart (NOVOLOG ) 100 UNIT/ML FlexPen Inject 0-9 Units into the skin 3 (three) times daily with meals. CBG < 70: Eat or drink something sweet and recheck, CBG 70 - 120: 0 units CBG 121 - 150: 1 unit CBG 151 - 200: 2 units CBG 201 - 250: 3 units CBG 251 - 300: 5 units CBG 301 - 350: 7 units CBG 351 - 400: 9 units CBG > 400: call MD. 08/07/23 04/25/24  [name], MD  insulin  glargine (LANTUS ) 100 UNIT/ML Solostar Pen Inject 12 Units into the skin daily. May substitute as needed per insurance. Patient taking differently:  Inject 40 Units into the skin 2 (two) times daily. 10/15/23 04/25/24  [place], [name], DO  metoCLOPramide  (REGLAN ) 10 MG tablet Take 1 tablet (10 mg total) by mouth every 6 (six) hours as needed for nausea or vomiting. 04/26/24   Pokhrel, Vernal, MD  ondansetron  (ZOFRAN ) 4 MG tablet Take 1 tablet (4 mg total) by mouth every 6 (six) hours. 03/20/24   Jerral Meth, MD  pantoprazole  (PROTONIX ) 40 MG tablet Take 1 tablet (40 mg total) by mouth daily. 04/26/24 04/26/25  Pokhrel, Laxman, MD  polyethylene glycol (MIRALAX  / GLYCOLAX ) 17 g packet Take 17 g by mouth daily as needed for mild constipation. 04/26/24   Pokhrel, Laxman, MD  promethazine  (PHENERGAN ) 25 MG suppository Place 1 suppository (25 mg total) rectally every 6 (six) hours as needed for nausea or vomiting. 02/22/24   Horton, [name], MD    Allergies: Mirapex [pramipexole]    Review of Systems  Gastrointestinal:  Positive for abdominal pain, nausea and vomiting.  All other systems reviewed and are negative.   Updated Vital Signs BP (!) 159/108 (BP Location: Right Arm)   Pulse 100   Temp 98.7 F (37.1 C) (Oral)   Resp 18   Wt 74.8 kg   LMP 05/17/2024   SpO2 100%   BMI 26.63 kg/m   Physical Exam Vitals and nursing note reviewed.  Constitutional:      General: She is not in acute distress.    Appearance: Normal appearance. She is  normal weight. She is not ill-appearing, toxic-appearing or diaphoretic.  HENT:     Head: Normocephalic and atraumatic.  Cardiovascular:     Rate and Rhythm: Normal rate.  Pulmonary:     Effort: Pulmonary effort is normal. No respiratory distress.  Abdominal:     General: Abdomen is flat.     Palpations: Abdomen is soft.     Tenderness: There is abdominal tenderness. There is no guarding or rebound.  Musculoskeletal:        General: Normal range of motion.     Cervical back: Normal range of motion.  Skin:    General: Skin is warm and dry.  Neurological:     General: No focal deficit present.      Mental Status: She is alert.  Psychiatric:        Mood and Affect: Mood normal.        Behavior: Behavior normal.     (all labs ordered are listed, but only abnormal results are displayed) Labs Reviewed  COMPREHENSIVE METABOLIC PANEL WITH GFR - Abnormal; Notable for the following components:      Result Value   Chloride 96 (*)    Glucose, Bld 208 (*)    Calcium  10.5 (*)    Total Protein 8.4 (*)    All other components within normal limits  URINALYSIS, ROUTINE W REFLEX MICROSCOPIC - Abnormal; Notable for the following components:   Glucose, UA 500 (*)    Hgb urine dipstick MODERATE (*)    Ketones, ur >80 (*)    Protein, ur >300 (*)    All other components within normal limits  CBG MONITORING, ED - Abnormal; Notable for the following components:   Glucose-Capillary 218 (*)    All other components within normal limits  LIPASE, BLOOD  CBC WITH DIFFERENTIAL/PLATELET  PREGNANCY, URINE  HCG, SERUM, QUALITATIVE    EKG: None  Radiology: No results found.   Procedures   Medications Ordered in the ED  sodium chloride  0.9 % bolus 1,000 mL (has no administration in time range)  HYDROmorphone  (DILAUDID ) injection 0.5 mg (has no administration in time range)  prochlorperazine  (COMPAZINE ) injection 10 mg (has no administration in time range)  diphenhydrAMINE  (BENADRYL ) injection 12.5 mg (has no administration in time range)                                    Medical Decision Making Amount and/or Complexity of Data Reviewed Labs: ordered.  Risk Prescription drug management.   This patient is a 38 y.o. female who presents to the ED for concern of abdominal pain, nausea/vomiting, this involves an extensive number of treatment options, and is a complaint that carries with it a high risk of complications and morbidity. The emergent differential diagnosis prior to evaluation includes, but is not limited to, AAA, gastroenteritis, appendicitis, Bowel obstruction, Bowel perforation.  Gastroparesis, DKA, Hernia, Inflammatory bowel disease, mesenteric ischemia, pancreatitis, peritonitis SBP, volvulus.  This is not an exhaustive differential.   Past Medical History / Co-morbidities / Social History:  has a past medical history of Chronic constipation (11/08/2021), Diabetic neuropathy (HCC), History of chicken pox, History of noncompliance with medical treatment (09/07/2022), Type 1 diabetes (HCC), and Vaginal odor (11/08/2021).  Additional history: Chart reviewed. Pertinent results include: Patient with several previous ER visits for similar symptoms, nausea/vomiting attributed to gastroparesis.  Physical Exam: Physical exam performed. The pertinent findings include: vomiting on exam, mild generalized abdominal  tenderness to palpation without rebound or guarding  Lab Tests: I ordered, and personally interpreted labs.  The pertinent results include: No leukocytosis or anemia.  Glucose 208, bicarb and anion gap WNL.  UA with ketones and protein.   Medications: I ordered medication including IV fluids, Dilaudid , Compazine , Benadryl  for abdominal pain, nausea/vomiting, dehydration. Reevaluation of the patient after these medicines showed that the patient resolved. I have reviewed the patients home medicines and have made adjustments as needed.   Disposition: After consideration of the diagnostic results and the patients response to treatment, I feel that emergency department workup does not suggest an emergent condition requiring admission or immediate intervention beyond what has been performed at this time. The plan is: Discharge with close outpatient follow-up and return precautions.  After above interventions, patient's symptoms have completely resolved and she feels better and ready to go home.  She is able to eat and drink without any residual nausea/vomiting.  She has no signs of DKA/HHS.  She has a significant history of similar symptoms previously with several previous CT  scans, given improvement with above interventions, no further evaluation with CT imaging is indicated at this time.  Discussed same with patient is understanding and in agreement with this.  She does request a refill of her Reglan  which I have provided for her. Evaluation and diagnostic testing in the emergency department does not suggest an emergent condition requiring admission or immediate intervention beyond what has been performed at this time.  Plan for discharge with close PCP follow-up.  Patient is understanding and amenable with plan, educated on red flag symptoms that would prompt immediate return.  Patient discharged in stable condition.  Final diagnoses:  Nausea and vomiting, unspecified vomiting type    ED Discharge Orders          Ordered    metoCLOPramide  (REGLAN ) 10 MG tablet  Every 6 hours        05/24/24 1546          An After Visit Summary was printed and given to the patient.      Nora Lauraine LABOR, PA-C 05/24/24 1547    Ruthe Cornet, DO 05/25/24 [phone]

## 2024-05-24 NOTE — ED Notes (Signed)
 Pt bib wheelchair, endorses feeling weak since 0500. States glucose 139 at home. Evomiting in triage.

## 2024-05-24 NOTE — Discharge Instructions (Signed)
 As we discussed, your workup in the ER today was reassuring for acute findings.  Laboratory evaluation did not reveal any emergent cause of your symptoms.  Given that you feel better after interventions today, no further evaluation is indicated.  I have given you a prescription refill for your Reglan  that you can take as prescribed as needed for any residual nausea/vomiting.  Please call your PCP to schedule close follow-up appointment.  Return if development of any new or worsening symptoms.

## 2024-05-25 ENCOUNTER — Encounter (HOSPITAL_BASED_OUTPATIENT_CLINIC_OR_DEPARTMENT_OTHER): Payer: Self-pay | Admitting: Emergency Medicine

## 2024-05-25 ENCOUNTER — Other Ambulatory Visit: Payer: Self-pay

## 2024-05-25 ENCOUNTER — Emergency Department (HOSPITAL_BASED_OUTPATIENT_CLINIC_OR_DEPARTMENT_OTHER)
Admission: EM | Admit: 2024-05-25 | Discharge: 2024-05-25 | Disposition: A | Discharge status: Home / Self Care | Attending: Emergency Medicine | Admitting: Emergency Medicine

## 2024-05-25 DIAGNOSIS — R1111 Vomiting without nausea: Secondary | ICD-10-CM

## 2024-05-25 DIAGNOSIS — E119 Type 2 diabetes mellitus without complications: Secondary | ICD-10-CM | POA: Diagnosis not present

## 2024-05-25 DIAGNOSIS — R112 Nausea with vomiting, unspecified: Secondary | ICD-10-CM | POA: Insufficient documentation

## 2024-05-25 DIAGNOSIS — Z794 Long term (current) use of insulin: Secondary | ICD-10-CM | POA: Diagnosis not present

## 2024-05-25 DIAGNOSIS — R109 Unspecified abdominal pain: Secondary | ICD-10-CM | POA: Diagnosis present

## 2024-05-25 LAB — CBC WITH DIFFERENTIAL/PLATELET
Abs Immature Granulocytes: 0.02 K/uL (ref 0.00–0.07)
Basophils Absolute: 0 K/uL (ref 0.0–0.1)
Basophils Relative: 0 %
Eosinophils Absolute: 0 K/uL (ref 0.0–0.5)
Eosinophils Relative: 0 %
HCT: 36.1 % (ref 36.0–46.0)
Hemoglobin: 12.4 g/dL (ref 12.0–15.0)
Immature Granulocytes: 0 %
Lymphocytes Relative: 25 %
Lymphs Abs: 1.9 K/uL (ref 0.7–4.0)
MCH: 30.3 pg (ref 26.0–34.0)
MCHC: 34.3 g/dL (ref 30.0–36.0)
MCV: 88.3 fL (ref 80.0–100.0)
Monocytes Absolute: 0.5 K/uL (ref 0.1–1.0)
Monocytes Relative: 6 %
Neutro Abs: 5.1 K/uL (ref 1.7–7.7)
Neutrophils Relative %: 69 %
Platelets: 354 K/uL (ref 150–400)
RBC: 4.09 MIL/uL (ref 3.87–5.11)
RDW: 12.9 % (ref 11.5–15.5)
WBC: 7.5 K/uL (ref 4.0–10.5)
nRBC: 0 % (ref 0.0–0.2)

## 2024-05-25 LAB — COMPREHENSIVE METABOLIC PANEL WITH GFR
ALT: 18 U/L (ref 0–44)
AST: 26 U/L (ref 15–41)
Albumin: 4.1 g/dL (ref 3.5–5.0)
Alkaline Phosphatase: 106 U/L (ref 38–126)
Anion gap: 13 (ref 5–15)
BUN: 9 mg/dL (ref 6–20)
CO2: 25 mmol/L (ref 22–32)
Calcium: 9.8 mg/dL (ref 8.9–10.3)
Chloride: 94 mmol/L — ABNORMAL LOW (ref 98–111)
Creatinine, Ser: 0.69 mg/dL (ref 0.44–1.00)
GFR, Estimated: >60 mL/min (ref >60)
Glucose, Bld: 254 mg/dL — ABNORMAL HIGH (ref 70–99)
Potassium: 4.4 mmol/L (ref 3.5–5.1)
Sodium: 132 mmol/L — ABNORMAL LOW (ref 135–145)
Total Bilirubin: 0.4 mg/dL (ref 0.0–1.2)
Total Protein: 7.6 g/dL (ref 6.5–8.1)

## 2024-05-25 LAB — LIPASE, BLOOD: Lipase: 14 U/L (ref 11–51)

## 2024-05-25 LAB — CBG MONITORING, ED: Glucose-Capillary: 251 mg/dL — ABNORMAL HIGH (ref 70–99)

## 2024-05-25 MED ORDER — HYDROMORPHONE HCL 1 MG/ML IJ SOLN
1.0000 mg | Freq: Once | INTRAMUSCULAR | Status: AC
  Administered 2024-05-25: 1 mg via INTRAVENOUS
  Filled 2024-05-25: qty 1

## 2024-05-25 MED ORDER — DIPHENHYDRAMINE HCL 50 MG/ML IJ SOLN
12.5000 mg | Freq: Once | INTRAMUSCULAR | Status: AC
  Administered 2024-05-25: 12.5 mg via INTRAVENOUS
  Filled 2024-05-25: qty 1

## 2024-05-25 MED ORDER — PROCHLORPERAZINE EDISYLATE 10 MG/2ML IJ SOLN
10.0000 mg | Freq: Once | INTRAMUSCULAR | Status: AC
  Administered 2024-05-25: 10 mg via INTRAVENOUS
  Filled 2024-05-25: qty 2

## 2024-05-25 MED ORDER — SODIUM CHLORIDE 0.9 % IV BOLUS: 1000.0000 mL | Freq: Once | INTRAVENOUS | Status: DC

## 2024-05-25 MED ORDER — SODIUM CHLORIDE 0.9 % IV BOLUS
1000.0000 mL | Freq: Once | INTRAVENOUS | Status: AC
  Administered 2024-05-25: 1000 mL via INTRAVENOUS

## 2024-05-25 NOTE — ED Provider Notes (Signed)
 [place] EMERGENCY DEPARTMENT AT [facility] Provider Note   CSN: [id number] Arrival date & time: 05/25/24  9244     Patient presents with: Abdominal Pain   [name] is a 38 y.o. female.   Continues with abdominal pain and nausea.  History of the same.  History of gastroparesis cyclical vomiting syndrome diabetes history.  Was seen here yesterday felt better had a Reglan  refill but symptoms got worse again.  Denies any fever or chills.  No pain with urination.  Denies any weakness numbness tingling.  Denies any cough sputum production.  Denies any chest pain shortness of breath.  No major medical problems open diabetes history.  No history of major intra-abdominal surgeries after C-section.  The history is provided by the patient.       Prior to Admission medications   Medication Sig Start Date End Date Taking? Authorizing Provider  oxyCODONE  (ROXICODONE ) 15 MG immediate release tablet Take 15 mg by mouth 4 (four) times daily. 04/28/24  Yes [provider]  Acetaminophen  (MIDOL  PO) Take 1 tablet by mouth every 6 (six) hours as needed (cramping).    [provider]  Continuous Glucose Sensor (DEXCOM G7 SENSOR) MISC Inject 1 Device into the skin See admin instructions. Place 1 new sensor into the skin every 10 days    [provider]  DULoxetine  (CYMBALTA ) 60 MG capsule Take 60 mg by mouth at bedtime. 06/28/22   [provider]  insulin  aspart (NOVOLOG ) 100 UNIT/ML FlexPen Inject 0-9 Units into the skin 3 (three) times daily with meals. CBG < 70: Eat or drink something sweet and recheck, CBG 70 - 120: 0 units CBG 121 - 150: 1 unit CBG 151 - 200: 2 units CBG 201 - 250: 3 units CBG 251 - 300: 5 units CBG 301 - 350: 7 units CBG 351 - 400: 9 units CBG > 400: call MD. 08/07/23 04/25/24  [name], MD  insulin  glargine (LANTUS ) 100 UNIT/ML Solostar Pen Inject 12 Units into the skin daily. May substitute as needed per  insurance. Patient taking differently: Inject 40 Units into the skin 2 (two) times daily. 10/15/23 04/25/24  [place], Camellia PARAS, DO  metoCLOPramide  (REGLAN ) 10 MG tablet Take 1 tablet (10 mg total) by mouth every 6 (six) hours. 05/24/24   Smoot, Lauraine LABOR, PA-C  ondansetron  (ZOFRAN ) 4 MG tablet Take 1 tablet (4 mg total) by mouth every 6 (six) hours. 03/20/24   Jerral Meth, MD  pantoprazole  (PROTONIX ) 40 MG tablet Take 1 tablet (40 mg total) by mouth daily. 04/26/24 04/26/25  Pokhrel, Laxman, MD  polyethylene glycol (MIRALAX  / GLYCOLAX ) 17 g packet Take 17 g by mouth daily as needed for mild constipation. 04/26/24   Pokhrel, Laxman, MD  promethazine  (PHENERGAN ) 25 MG suppository Place 1 suppository (25 mg total) rectally every 6 (six) hours as needed for nausea or vomiting. 02/22/24   Horton, [name], MD    Allergies: Mirapex [pramipexole]    Review of Systems  Updated Vital Signs BP (!) 174/102 (BP Location: Right Arm)   Pulse (!) 101   Temp 98.2 F (36.8 C) (Oral)   Resp 18   LMP 05/17/2024   SpO2 100%   Physical Exam Vitals and nursing note reviewed.  Constitutional:      General: She is not in acute distress.    Appearance: She is well-developed. She is not ill-appearing.  HENT:     Head: Normocephalic and atraumatic.  Eyes:  Extraocular Movements: Extraocular movements intact.     Conjunctiva/sclera: Conjunctivae normal.     Pupils: Pupils are equal, round, and reactive to light.  Cardiovascular:     Rate and Rhythm: Normal rate and regular rhythm.     Heart sounds: Normal heart sounds. No murmur heard. Pulmonary:     Effort: Pulmonary effort is normal. No respiratory distress.     Breath sounds: Normal breath sounds.  Abdominal:     General: Abdomen is flat.     Palpations: Abdomen is soft.     Tenderness: There is abdominal tenderness.  Musculoskeletal:        General: No swelling.     Cervical back: Neck supple.  Skin:    General: Skin is warm and dry.     Capillary  Refill: Capillary refill takes less than 2 seconds.  Neurological:     Mental Status: She is alert.  Psychiatric:        Mood and Affect: Mood normal.     (all labs ordered are listed, but only abnormal results are displayed) Labs Reviewed  COMPREHENSIVE METABOLIC PANEL WITH GFR - Abnormal; Notable for the following components:      Result Value   Sodium 132 (*)    Chloride 94 (*)    Glucose, Bld 254 (*)    All other components within normal limits  CBG MONITORING, ED - Abnormal; Notable for the following components:   Glucose-Capillary 251 (*)    All other components within normal limits  CBC WITH DIFFERENTIAL/PLATELET  LIPASE, BLOOD    EKG: None  Radiology: No results found.   Procedures   Medications Ordered in the ED  sodium chloride  0.9 % bolus 1,000 mL (has no administration in time range)  prochlorperazine  (COMPAZINE ) injection 10 mg (10 mg Intravenous Given 05/25/24 0824)  diphenhydrAMINE  (BENADRYL ) injection 12.5 mg (12.5 mg Intravenous Given 05/25/24 0824)  HYDROmorphone  (DILAUDID ) injection 1 mg (1 mg Intravenous Given 05/25/24 0824)  sodium chloride  0.9 % bolus 1,000 mL (1,000 mLs Intravenous New Bag/Given 05/25/24 9177)                                    Medical Decision Making Amount and/or Complexity of Data Reviewed Labs: ordered. Radiology: ordered.  Risk Prescription drug management.   [name] is here with ongoing nausea vomiting abdominal pain.  History of gastroparesis diabetes.  Cyclical vomiting syndrome, marijuana use history.  Seen here yesterday for the same.  Had improvement but got worse today again.  Differential diagnosis likely ongoing cyclical vomiting process, seems less likely to be intra-abdominal process such as appendicitis bowel obstruction colitis but will likely consider CT scan today.  Will recheck basic labs give IV fluids IV antiemetics and pain medicine and reevaluate.  Patient feeling better.  She wanted to  be discharged prior to CT scan and any further workup.  Lab work was reassuring.  I thought it be reasonable to get a CT scan to further evaluate for any acute processes.  She has prescription for Reglan  to pick up.  She understands return precautions.  She has capacity make decision to leave but overall I have low suspicion at this any other acute process.  Understands return precautions.  This chart was dictated using voice recognition software.  Despite best efforts to proofread,  errors can occur which can change the documentation meaning.      Final diagnoses:  Vomiting without nausea, unspecified vomiting type    ED Discharge Orders     None          Ruthe Cornet, DO 05/25/24 9151

## 2024-05-25 NOTE — ED Triage Notes (Signed)
 C/o ABD pain w/ nausea. Seen here yesterday for same. Requesting same treatment as earlier

## 2024-05-25 NOTE — ED Notes (Signed)
 Patient requesting to be d/c d/t having to pick her child up from school. RN spoke with patient about driving after medications. Patient states she had a ride.

## 2024-05-25 NOTE — ED Notes (Addendum)
 Patient unable to provide urine sample as this time.

## 2024-06-01 ENCOUNTER — Encounter (HOSPITAL_BASED_OUTPATIENT_CLINIC_OR_DEPARTMENT_OTHER): Payer: Self-pay

## 2024-06-01 ENCOUNTER — Other Ambulatory Visit: Payer: Self-pay

## 2024-06-01 ENCOUNTER — Emergency Department (HOSPITAL_BASED_OUTPATIENT_CLINIC_OR_DEPARTMENT_OTHER)
Admission: EM | Admit: 2024-06-01 | Discharge: 2024-06-01 | Disposition: A | Discharge status: Home / Self Care | Attending: Emergency Medicine | Admitting: Emergency Medicine

## 2024-06-01 DIAGNOSIS — R112 Nausea with vomiting, unspecified: Secondary | ICD-10-CM | POA: Insufficient documentation

## 2024-06-01 DIAGNOSIS — E119 Type 2 diabetes mellitus without complications: Secondary | ICD-10-CM | POA: Insufficient documentation

## 2024-06-01 DIAGNOSIS — G8929 Other chronic pain: Secondary | ICD-10-CM | POA: Diagnosis not present

## 2024-06-01 DIAGNOSIS — R1084 Generalized abdominal pain: Secondary | ICD-10-CM | POA: Diagnosis present

## 2024-06-01 DIAGNOSIS — Z794 Long term (current) use of insulin: Secondary | ICD-10-CM | POA: Insufficient documentation

## 2024-06-01 LAB — COMPREHENSIVE METABOLIC PANEL WITH GFR
ALT: 11 U/L (ref 0–44)
AST: 17 U/L (ref 15–41)
Albumin: 4.6 g/dL (ref 3.5–5.0)
Alkaline Phosphatase: 98 U/L (ref 38–126)
Anion gap: 11 (ref 5–15)
BUN: 7 mg/dL (ref 6–20)
CO2: 27 mmol/L (ref 22–32)
Calcium: 10.4 mg/dL — ABNORMAL HIGH (ref 8.9–10.3)
Chloride: 95 mmol/L — ABNORMAL LOW (ref 98–111)
Creatinine, Ser: 0.67 mg/dL (ref 0.44–1.00)
GFR, Estimated: >60 mL/min (ref >60)
Glucose, Bld: 269 mg/dL — ABNORMAL HIGH (ref 70–99)
Potassium: 3.6 mmol/L (ref 3.5–5.1)
Sodium: 134 mmol/L — ABNORMAL LOW (ref 135–145)
Total Bilirubin: 0.5 mg/dL (ref 0.0–1.2)
Total Protein: 8.1 g/dL (ref 6.5–8.1)

## 2024-06-01 LAB — CBC WITH DIFFERENTIAL/PLATELET
Abs Immature Granulocytes: 0.01 K/uL (ref 0.00–0.07)
Basophils Absolute: 0 K/uL (ref 0.0–0.1)
Basophils Relative: 1 %
Eosinophils Absolute: 0.1 K/uL (ref 0.0–0.5)
Eosinophils Relative: 2 %
HCT: 39.5 % (ref 36.0–46.0)
Hemoglobin: 13.8 g/dL (ref 12.0–15.0)
Immature Granulocytes: 0 %
Lymphocytes Relative: 33 %
Lymphs Abs: 2.3 K/uL (ref 0.7–4.0)
MCH: 30.9 pg (ref 26.0–34.0)
MCHC: 34.9 g/dL (ref 30.0–36.0)
MCV: 88.6 fL (ref 80.0–100.0)
Monocytes Absolute: 0.4 K/uL (ref 0.1–1.0)
Monocytes Relative: 6 %
Neutro Abs: 4.1 K/uL (ref 1.7–7.7)
Neutrophils Relative %: 58 %
Platelets: 312 K/uL (ref 150–400)
RBC: 4.46 MIL/uL (ref 3.87–5.11)
RDW: 13.2 % (ref 11.5–15.5)
WBC: 6.9 K/uL (ref 4.0–10.5)
nRBC: 0 % (ref 0.0–0.2)

## 2024-06-01 LAB — HCG, SERUM, QUALITATIVE: Preg, Serum: NEGATIVE

## 2024-06-01 MED ORDER — DROPERIDOL 2.5 MG/ML IJ SOLN
1.2500 mg | Freq: Once | INTRAMUSCULAR | Status: AC
  Administered 2024-06-01: 1.25 mg via INTRAVENOUS
  Filled 2024-06-01: qty 2

## 2024-06-01 MED ORDER — KETOROLAC TROMETHAMINE 30 MG/ML IJ SOLN
30.0000 mg | Freq: Once | INTRAMUSCULAR | Status: AC
  Administered 2024-06-01: 30 mg via INTRAVENOUS
  Filled 2024-06-01: qty 1

## 2024-06-01 MED ORDER — OXYCODONE-ACETAMINOPHEN 5-325 MG PO TABS
2.0000 | ORAL_TABLET | Freq: Once | ORAL | Status: AC
  Administered 2024-06-01: 2 via ORAL
  Filled 2024-06-01: qty 2

## 2024-06-01 MED ORDER — SODIUM CHLORIDE 0.9 % IV BOLUS
500.0000 mL | Freq: Once | INTRAVENOUS | Status: AC
  Administered 2024-06-01: 500 mL via INTRAVENOUS

## 2024-06-01 MED ORDER — MAGNESIUM SULFATE 2 GM/50ML IV SOLN
2.0000 g | Freq: Once | INTRAVENOUS | Status: AC
  Administered 2024-06-01: 2 g via INTRAVENOUS
  Filled 2024-06-01: qty 50

## 2024-06-01 MED ORDER — METOCLOPRAMIDE HCL 10 MG PO TABS: 10.0000 mg | ORAL_TABLET | Freq: Three times a day (TID) | ORAL | 0 refills | Status: DC | PRN

## 2024-06-01 NOTE — ED Provider Notes (Signed)
 There are no non verbal cues of pain.  Exam is benign and reassuring.  Patient is asking for dilaudid  by name. This is chronic pain, I will not be using IV opioids for this.  I have informed patient that I do not use dilaudid  in my practice unless directed by protocol.  I suspect there is a component of drug seeking as patient has been seen multiple times for this.  Patient is not in DKA.  She is stable for discharge.     Teofila Bowery, MD 06/01/24 [phone]

## 2024-06-01 NOTE — ED Triage Notes (Signed)
 Patient states she has not been feeling good. States she has been vomiting and has abdominal pain. Symptoms began yesterday. History of Type 1 DM, neurophathy.

## 2024-06-01 NOTE — ED Provider Notes (Signed)
 [place] EMERGENCY DEPARTMENT AT [facility] Provider Note   CSN: [id number] Arrival date & time: 06/01/24  0413     Patient presents with: Abdominal Pain   [name] is a 38 y.o. female.   The history is provided by the patient.  Abdominal Pain Pain location:  Generalized Pain radiates to:  Does not radiate Pain severity:  Moderate Onset quality:  Gradual Duration:  1 day Timing:  Constant Progression:  Unchanged Chronicity:  Recurrent Context: retching   Relieved by:  Nothing Worsened by:  Nothing Ineffective treatments:  None tried Associated symptoms: nausea and vomiting   Associated symptoms: no anorexia, no dysuria and no fever   Risk factors comment:  Diabetes Patient with diabetes, chronic pain and cyclic vomiting syndrome presents with abdominal pain and vomiting. No fevers, no diarrhea no urinary complaints.       Prior to Admission medications   Medication Sig Start Date End Date Taking? Authorizing Provider  Acetaminophen  (MIDOL  PO) Take 1 tablet by mouth every 6 (six) hours as needed (cramping).    [provider]  Continuous Glucose Sensor (DEXCOM G7 SENSOR) MISC Inject 1 Device into the skin See admin instructions. Place 1 new sensor into the skin every 10 days    [provider]  DULoxetine  (CYMBALTA ) 60 MG capsule Take 60 mg by mouth at bedtime. 06/28/22   [provider]  insulin  aspart (NOVOLOG ) 100 UNIT/ML FlexPen Inject 0-9 Units into the skin 3 (three) times daily with meals. CBG < 70: Eat or drink something sweet and recheck, CBG 70 - 120: 0 units CBG 121 - 150: 1 unit CBG 151 - 200: 2 units CBG 201 - 250: 3 units CBG 251 - 300: 5 units CBG 301 - 350: 7 units CBG 351 - 400: 9 units CBG > 400: call MD. 08/07/23 04/25/24  [name], MD  insulin  glargine (LANTUS ) 100 UNIT/ML Solostar Pen Inject 12 Units into the skin daily. May substitute as needed per insurance. Patient taking differently:  Inject 40 Units into the skin 2 (two) times daily. 10/15/23 04/25/24  [place], Camellia PARAS, DO  metoCLOPramide  (REGLAN ) 10 MG tablet Take 1 tablet (10 mg total) by mouth every 6 (six) hours. 05/24/24   Smoot, Lauraine LABOR, PA-C  ondansetron  (ZOFRAN ) 4 MG tablet Take 1 tablet (4 mg total) by mouth every 6 (six) hours. 03/20/24   Jerral Meth, MD  oxyCODONE  (ROXICODONE ) 15 MG immediate release tablet Take 15 mg by mouth 4 (four) times daily. 04/28/24   [provider]  pantoprazole  (PROTONIX ) 40 MG tablet Take 1 tablet (40 mg total) by mouth daily. 04/26/24 04/26/25  Pokhrel, Laxman, MD  polyethylene glycol (MIRALAX  / GLYCOLAX ) 17 g packet Take 17 g by mouth daily as needed for mild constipation. 04/26/24   Pokhrel, Vernal, MD  promethazine  (PHENERGAN ) 25 MG suppository Place 1 suppository (25 mg total) rectally every 6 (six) hours as needed for nausea or vomiting. 02/22/24   Horton, [name], MD    Allergies: Mirapex [pramipexole]    Review of Systems  Constitutional:  Negative for fever.  Gastrointestinal:  Positive for abdominal pain, nausea and vomiting. Negative for anorexia.  Genitourinary:  Negative for dysuria.  All other systems reviewed and are negative.   Updated Vital Signs BP (!) 182/113   Pulse 100   Temp 98.3 F (36.8 C) (Oral)   Resp 16   Ht 5' 6 (1.676 m)   Wt 73.9 kg   LMP 05/17/2024  SpO2 100%   BMI 26.31 kg/m   Physical Exam Vitals and nursing note reviewed.  Constitutional:      General: She is not in acute distress.    Appearance: She is well-developed.  HENT:     Head: Normocephalic and atraumatic.     Nose: Nose normal.  Eyes:     Pupils: Pupils are equal, round, and reactive to light.  Cardiovascular:     Rate and Rhythm: Normal rate and regular rhythm.     Pulses: Normal pulses.     Heart sounds: Normal heart sounds.  Pulmonary:     Effort: Pulmonary effort is normal. No respiratory distress.     Breath sounds: Normal breath sounds.  Abdominal:      General: Bowel sounds are normal. There is no distension.     Palpations: Abdomen is soft.     Tenderness: There is no abdominal tenderness. There is no guarding or rebound.  Musculoskeletal:        General: Normal range of motion.     Cervical back: Neck supple.  Skin:    General: Skin is warm and dry.     Capillary Refill: Capillary refill takes less than 2 seconds.     Findings: No erythema or rash.  Neurological:     General: No focal deficit present.     Deep Tendon Reflexes: Reflexes normal.  Psychiatric:        Mood and Affect: Mood normal.     (all labs ordered are listed, but only abnormal results are displayed) Results for orders placed or performed during the hospital encounter of 06/01/24  CBC with Differential   Collection Time: 06/01/24  4:17 AM  Result Value Ref Range   WBC 6.9 4.0 - 10.5 K/uL   RBC 4.46 3.87 - 5.11 MIL/uL   Hemoglobin 13.8 12.0 - 15.0 g/dL   HCT 60.4 63.9 - 53.9 %   MCV 88.6 80.0 - 100.0 fL   MCH 30.9 26.0 - 34.0 pg   MCHC 34.9 30.0 - 36.0 g/dL   RDW 86.7 88.4 - 84.4 %   Platelets 312 150 - 400 K/uL   nRBC 0.0 0.0 - 0.2 %   Neutrophils Relative % 58 %   Neutro Abs 4.1 1.7 - 7.7 K/uL   Lymphocytes Relative 33 %   Lymphs Abs 2.3 0.7 - 4.0 K/uL   Monocytes Relative 6 %   Monocytes Absolute 0.4 0.1 - 1.0 K/uL   Eosinophils Relative 2 %   Eosinophils Absolute 0.1 0.0 - 0.5 K/uL   Basophils Relative 1 %   Basophils Absolute 0.0 0.0 - 0.1 K/uL   Immature Granulocytes 0 %   Abs Immature Granulocytes 0.01 0.00 - 0.07 K/uL  hCG, serum, qualitative   Collection Time: 06/01/24  4:17 AM  Result Value Ref Range   Preg, Serum NEGATIVE NEGATIVE  Comprehensive metabolic panel   Collection Time: 06/01/24  4:17 AM  Result Value Ref Range   Sodium 134 (L) 135 - 145 mmol/L   Potassium 3.6 3.5 - 5.1 mmol/L   Chloride 95 (L) 98 - 111 mmol/L   CO2 27 22 - 32 mmol/L   Glucose, Bld 269 (H) 70 - 99 mg/dL   BUN 7 6 - 20 mg/dL   Creatinine, Ser 9.32 0.44  - 1.00 mg/dL   Calcium  10.4 (H) 8.9 - 10.3 mg/dL   Total Protein 8.1 6.5 - 8.1 g/dL   Albumin 4.6 3.5 - 5.0 g/dL   AST 17 15 -  41 U/L   ALT 11 0 - 44 U/L   Alkaline Phosphatase 98 38 - 126 U/L   Total Bilirubin 0.5 0.0 - 1.2 mg/dL   GFR, Estimated >39 >39 mL/min   Anion gap 11 5 - 15   No results found.  EKG: EKG Interpretation Date/Time:  Tuesday June 01 2024 04:42:17 EDT Ventricular Rate:  115 PR Interval:  128 QRS Duration:  68 QT Interval:  346 QTC Calculation: 478 R Axis:   90  Text Interpretation: Sinus tachycardia Confirmed by Kimm Ungaro ([id number]) on 06/01/2024 5:36:01 AM  Radiology: No results found.   Procedures   Medications Ordered in the ED  droperidol  (INAPSINE ) 2.5 MG/ML injection 1.25 mg (1.25 mg Intravenous Given 06/01/24 0446)  magnesium  sulfate IVPB 2 g 50 mL (0 g Intravenous Stopped 06/01/24 0603)  ketorolac  (TORADOL ) 30 MG/ML injection 30 mg (30 mg Intravenous Given 06/01/24 0446)  sodium chloride  0.9 % bolus 500 mL (0 mLs Intravenous Stopped 06/01/24 0618)  droperidol  (INAPSINE ) 2.5 MG/ML injection 1.25 mg (1.25 mg Intravenous Given 06/01/24 0609)  oxyCODONE -acetaminophen  (PERCOCET/ROXICET) 5-325 MG per tablet 2 tablet (2 tablets Oral Given 06/01/24 0609)  sodium chloride  0.9 % bolus 500 mL (500 mLs Intravenous New Bag/Given 06/01/24 0618)                                    Medical Decision Making Amount and/or Complexity of Data Reviewed External Data Reviewed: notes.    Details: Previous notes reviewed  Labs: ordered.    Details: Normal white 6.9, normal 13.8, normal platelets. Sodium slight 134  Risk Prescription drug management. Risk Details: Resting comfortably in bed. There is no anion gap and this is not consistent with DKA.  Patient has had multiple imaging studies this year without any acute finding.  The risks of radiation outweigh the benefits.  Stable for discharge      Final diagnoses:  Other chronic pain  Nausea and  vomiting, unspecified vomiting type    No signs of systemic illness or infection. The patient is nontoxic-appearing on exam and vital signs are within normal limits.  I have reviewed the triage vital signs and the nursing notes. Pertinent labs & imaging results that were available during my care of the patient were reviewed by me and considered in my medical decision making (see chart for details). After history, exam, and medical workup I feel the patient has been appropriately medically screened and is safe for discharge home. Pertinent diagnoses were discussed with the patient. Patient was given return precautions.  ED Discharge Orders     None          Rhodie Cienfuegos, MD 06/01/24 [phone]

## 2024-06-09 ENCOUNTER — Encounter (HOSPITAL_COMMUNITY): Payer: Self-pay

## 2024-06-09 ENCOUNTER — Other Ambulatory Visit: Payer: Self-pay

## 2024-06-09 ENCOUNTER — Emergency Department (HOSPITAL_COMMUNITY): Admission: EM | Admit: 2024-06-09 | Discharge: 2024-06-09 | Disposition: A | Discharge status: Home / Self Care

## 2024-06-09 DIAGNOSIS — R739 Hyperglycemia, unspecified: Secondary | ICD-10-CM

## 2024-06-09 DIAGNOSIS — R112 Nausea with vomiting, unspecified: Secondary | ICD-10-CM | POA: Insufficient documentation

## 2024-06-09 DIAGNOSIS — Z79899 Other long term (current) drug therapy: Secondary | ICD-10-CM | POA: Diagnosis not present

## 2024-06-09 DIAGNOSIS — Z794 Long term (current) use of insulin: Secondary | ICD-10-CM | POA: Insufficient documentation

## 2024-06-09 DIAGNOSIS — R1084 Generalized abdominal pain: Secondary | ICD-10-CM | POA: Diagnosis present

## 2024-06-09 DIAGNOSIS — E1165 Type 2 diabetes mellitus with hyperglycemia: Secondary | ICD-10-CM | POA: Diagnosis not present

## 2024-06-09 DIAGNOSIS — I10 Essential (primary) hypertension: Secondary | ICD-10-CM | POA: Diagnosis not present

## 2024-06-09 DIAGNOSIS — D72829 Elevated white blood cell count, unspecified: Secondary | ICD-10-CM | POA: Insufficient documentation

## 2024-06-09 LAB — COMPREHENSIVE METABOLIC PANEL WITH GFR
ALT: 11 U/L (ref 0–44)
AST: 17 U/L (ref 15–41)
Albumin: 3.5 g/dL (ref 3.5–5.0)
Alkaline Phosphatase: 81 U/L (ref 38–126)
Anion gap: 10 (ref 5–15)
BUN: 8 mg/dL (ref 6–20)
CO2: 31 mmol/L (ref 22–32)
Calcium: 8.9 mg/dL (ref 8.9–10.3)
Chloride: 98 mmol/L (ref 98–111)
Creatinine, Ser: 0.72 mg/dL (ref 0.44–1.00)
GFR, Estimated: >60 mL/min (ref >60)
Glucose, Bld: 289 mg/dL — ABNORMAL HIGH (ref 70–99)
Potassium: 3.6 mmol/L (ref 3.5–5.1)
Sodium: 138 mmol/L (ref 135–145)
Total Bilirubin: <0.2 mg/dL (ref 0.0–1.2)
Total Protein: 6.3 g/dL — ABNORMAL LOW (ref 6.5–8.1)

## 2024-06-09 LAB — BASIC METABOLIC PANEL WITH GFR
Anion gap: 13 (ref 5–15)
BUN: 7 mg/dL (ref 6–20)
CO2: 25 mmol/L (ref 22–32)
Calcium: 7.4 mg/dL — ABNORMAL LOW (ref 8.9–10.3)
Chloride: 104 mmol/L (ref 98–111)
Creatinine, Ser: 0.55 mg/dL (ref 0.44–1.00)
GFR, Estimated: >60 mL/min (ref >60)
Glucose, Bld: 351 mg/dL — ABNORMAL HIGH (ref 70–99)
Potassium: 3.2 mmol/L — ABNORMAL LOW (ref 3.5–5.1)
Sodium: 141 mmol/L (ref 135–145)

## 2024-06-09 LAB — BLOOD GAS, VENOUS
Acid-Base Excess: 12.7 mmol/L — ABNORMAL HIGH (ref 0.0–2.0)
Bicarbonate: 38.1 mmol/L — ABNORMAL HIGH (ref 20.0–28.0)
O2 Saturation: 71.2 %
Patient temperature: 37
pCO2, Ven: 50 mmHg (ref 44–60)
pH, Ven: 7.49 — ABNORMAL HIGH (ref 7.25–7.43)
pO2, Ven: 42 mmHg (ref 32–45)

## 2024-06-09 LAB — CBG MONITORING, ED
Glucose-Capillary: 433 mg/dL — ABNORMAL HIGH (ref 70–99)
Glucose-Capillary: 335 mg/dL — ABNORMAL HIGH (ref 70–99)

## 2024-06-09 LAB — CBC WITH DIFFERENTIAL/PLATELET
Abs Immature Granulocytes: 0.07 K/uL (ref 0.00–0.07)
Basophils Absolute: 0 K/uL (ref 0.0–0.1)
Basophils Relative: 0 %
Eosinophils Absolute: 0 K/uL (ref 0.0–0.5)
Eosinophils Relative: 0 %
HCT: 37 % (ref 36.0–46.0)
Hemoglobin: 12.6 g/dL (ref 12.0–15.0)
Immature Granulocytes: 1 %
Lymphocytes Relative: 8 %
Lymphs Abs: 1.1 K/uL (ref 0.7–4.0)
MCH: 30.7 pg (ref 26.0–34.0)
MCHC: 34.1 g/dL (ref 30.0–36.0)
MCV: 90.2 fL (ref 80.0–100.0)
Monocytes Absolute: 0.4 K/uL (ref 0.1–1.0)
Monocytes Relative: 3 %
Neutro Abs: 11.7 K/uL — ABNORMAL HIGH (ref 1.7–7.7)
Neutrophils Relative %: 88 %
Platelets: 339 K/uL (ref 150–400)
RBC: 4.1 MIL/uL (ref 3.87–5.11)
RDW: 12.7 % (ref 11.5–15.5)
WBC: 13.3 K/uL — ABNORMAL HIGH (ref 4.0–10.5)
nRBC: 0 % (ref 0.0–0.2)

## 2024-06-09 LAB — URINALYSIS, ROUTINE W REFLEX MICROSCOPIC
Bacteria, UA: NONE SEEN
Bilirubin Urine: NEGATIVE
Glucose, UA: >=500 mg/dL — AB
Hgb urine dipstick: NEGATIVE
Ketones, ur: 5 mg/dL — AB
Leukocytes,Ua: NEGATIVE
Nitrite: NEGATIVE
Protein, ur: 100 mg/dL — AB
Specific Gravity, Urine: 1.021 (ref 1.005–1.030)
pH: 8 (ref 5.0–8.0)

## 2024-06-09 LAB — BETA-HYDROXYBUTYRIC ACID: Beta-Hydroxybutyric Acid: 0.84 mmol/L — ABNORMAL HIGH (ref 0.05–0.27)

## 2024-06-09 LAB — OSMOLALITY: Osmolality: 300 mosm/kg — ABNORMAL HIGH (ref 275–295)

## 2024-06-09 LAB — HCG, SERUM, QUALITATIVE: Preg, Serum: NEGATIVE

## 2024-06-09 MED ORDER — LACTATED RINGERS IV BOLUS
1000.0000 mL | Freq: Once | INTRAVENOUS | Status: AC
  Administered 2024-06-09: 1000 mL via INTRAVENOUS

## 2024-06-09 MED ORDER — KETOROLAC TROMETHAMINE 15 MG/ML IJ SOLN
15.0000 mg | Freq: Once | INTRAMUSCULAR | Status: AC
Start: 2024-06-09 — End: 2024-06-09
  Administered 2024-06-09: 15 mg via INTRAVENOUS
  Filled 2024-06-09: qty 1

## 2024-06-09 MED ORDER — INSULIN ASPART PROT & ASPART (70-30 MIX) 100 UNIT/ML ~~LOC~~ SUSP: 7.0000 [IU] | Freq: Once | SUBCUTANEOUS | Status: DC

## 2024-06-09 MED ORDER — DIPHENHYDRAMINE HCL 50 MG/ML IJ SOLN
12.5000 mg | Freq: Once | INTRAMUSCULAR | Status: AC
  Administered 2024-06-09: 12.5 mg via INTRAVENOUS
  Filled 2024-06-09: qty 1

## 2024-06-09 MED ORDER — HYDROMORPHONE HCL 1 MG/ML IJ SOLN
0.5000 mg | Freq: Once | INTRAMUSCULAR | Status: AC
  Administered 2024-06-09: 0.5 mg via INTRAVENOUS
  Filled 2024-06-09: qty 1

## 2024-06-09 MED ORDER — INSULIN ASPART 100 UNIT/ML IJ SOLN
7.0000 [IU] | Freq: Once | INTRAMUSCULAR | Status: AC
  Administered 2024-06-09: 7 [IU] via SUBCUTANEOUS
  Filled 2024-06-09: qty 0.07

## 2024-06-09 MED ORDER — POTASSIUM CHLORIDE CRYS ER 20 MEQ PO TBCR
40.0000 meq | EXTENDED_RELEASE_TABLET | Freq: Once | ORAL | Status: AC
  Administered 2024-06-09: 40 meq via ORAL
  Filled 2024-06-09: qty 2

## 2024-06-09 MED ORDER — PROCHLORPERAZINE EDISYLATE 10 MG/2ML IJ SOLN
10.0000 mg | Freq: Once | INTRAMUSCULAR | Status: AC
  Administered 2024-06-09: 10 mg via INTRAVENOUS
  Filled 2024-06-09: qty 2

## 2024-06-09 MED ORDER — METOCLOPRAMIDE HCL 10 MG PO TABS: 10.0000 mg | ORAL_TABLET | Freq: Three times a day (TID) | ORAL | 0 refills | Status: DC | PRN

## 2024-06-09 MED ORDER — LABETALOL HCL 5 MG/ML IV SOLN
10.0000 mg | Freq: Once | INTRAVENOUS | Status: AC
Start: 2024-06-09 — End: 2024-06-09
  Administered 2024-06-09: 10 mg via INTRAVENOUS
  Filled 2024-06-09: qty 4

## 2024-06-09 MED ORDER — AMLODIPINE BESYLATE 5 MG PO TABS: 5.0000 mg | ORAL_TABLET | Freq: Every day | ORAL | 0 refills | Status: DC

## 2024-06-09 NOTE — ED Triage Notes (Addendum)
 Pt BIB EMS from home with reports of hyperglycemia, n/v, and abdominal pain since 0400 this morning.   18 g left ac 100 ml ns  4 mg zofran 

## 2024-06-09 NOTE — ED Provider Notes (Signed)
  Physical Exam  BP (!) 152/98 (BP Location: Right Arm)   Pulse 98   Temp 98.9 F (37.2 C) (Oral)   Resp 14   LMP 05/17/2024   SpO2 100%   Physical Exam  Procedures  Procedures  ED Course / MDM    Medical Decision Making Amount and/or Complexity of Data Reviewed Labs: ordered.  Risk Prescription drug management.   Although assumed care of this patient from the previous team.  Patient has history of diabetes and gastroparesis.  She comes in with elevated blood sugar, nausea and vomiting.  Patient is getting IV fluids.  She has passed oral challenge.  On my evaluation, patient has no peritonitis.  She however was noted to have calcium  of 7.  My plan is to get repeat metabolic profile once she passes p.o. challenge.   Reassessment: On CMP, patient's calcium  is noted to be normal.  The abnormal calcium  of 7.4, likely erroneous. Patient continues to be feeling well.  She is stable for discharge.   Charlyn Sora, MD 06/09/24 [phone]

## 2024-06-09 NOTE — Discharge Instructions (Signed)

## 2024-06-09 NOTE — ED Provider Notes (Addendum)
 [place] EMERGENCY DEPARTMENT AT [facility] Provider Note   CSN: [id number] Arrival date & time: 06/09/24  1042     Patient presents with: Hyperglycemia   [name] is a 38 y.o. female.   38 year old female past medical history of diabetes and gastroparesis presenting to the emergency department today with abdominal pain, vomiting, and elevated blood glucose.  The patient states has been going on for the past day or 2.  She denies any fevers.  Reports that the abdominal pain is diffuse in character.  States this does feel relatively similar to flareups of her chronic abdominal pain.  The patient states that she has been having some polyuria and polydipsia.  She came to the ER today further evaluation regarding this.   Hyperglycemia Associated symptoms: abdominal pain        Prior to Admission medications   Medication Sig Start Date End Date Taking? Authorizing Provider  amLODipine (NORVASC) 5 MG tablet Take 1 tablet (5 mg total) by mouth daily for 15 days. 06/09/24 06/24/24 Yes Ula Prentice SAUNDERS, MD  Acetaminophen  (MIDOL  PO) Take 1 tablet by mouth every 6 (six) hours as needed (cramping).    [provider]  Continuous Glucose Sensor (DEXCOM G7 SENSOR) MISC Inject 1 Device into the skin See admin instructions. Place 1 new sensor into the skin every 10 days    [provider]  DULoxetine  (CYMBALTA ) 60 MG capsule Take 60 mg by mouth at bedtime. 06/28/22   [provider]  insulin  aspart (NOVOLOG ) 100 UNIT/ML FlexPen Inject 0-9 Units into the skin 3 (three) times daily with meals. CBG < 70: Eat or drink something sweet and recheck, CBG 70 - 120: 0 units CBG 121 - 150: 1 unit CBG 151 - 200: 2 units CBG 201 - 250: 3 units CBG 251 - 300: 5 units CBG 301 - 350: 7 units CBG 351 - 400: 9 units CBG > 400: call MD. 08/07/23 04/25/24  [name], MD  insulin  glargine (LANTUS ) 100 UNIT/ML Solostar Pen Inject 12 Units into the skin daily. May  substitute as needed per insurance. Patient taking differently: Inject 40 Units into the skin 2 (two) times daily. 10/15/23 04/25/24  [place], Camellia PARAS, DO  metoCLOPramide  (REGLAN ) 10 MG tablet Take 1 tablet (10 mg total) by mouth every 6 (six) hours. 05/24/24   Smoot, Lauraine LABOR, PA-C  metoCLOPramide  (REGLAN ) 10 MG tablet Take 1 tablet (10 mg total) by mouth every 8 (eight) hours as needed for nausea. 06/09/24   Ula Prentice SAUNDERS, MD  ondansetron  (ZOFRAN ) 4 MG tablet Take 1 tablet (4 mg total) by mouth every 6 (six) hours. 03/20/24   Jerral Meth, MD  oxyCODONE  (ROXICODONE ) 15 MG immediate release tablet Take 15 mg by mouth 4 (four) times daily. 04/28/24   [provider]  pantoprazole  (PROTONIX ) 40 MG tablet Take 1 tablet (40 mg total) by mouth daily. 04/26/24 04/26/25  Pokhrel, Laxman, MD  polyethylene glycol (MIRALAX  / GLYCOLAX ) 17 g packet Take 17 g by mouth daily as needed for mild constipation. 04/26/24   Pokhrel, Laxman, MD  promethazine  (PHENERGAN ) 25 MG suppository Place 1 suppository (25 mg total) rectally every 6 (six) hours as needed for nausea or vomiting. 02/22/24   Horton, [name], MD    Allergies: Mirapex [pramipexole]    Review of Systems  Gastrointestinal:  Positive for abdominal pain.  All other systems reviewed and are negative.   Updated Vital Signs BP (!) 185/116 (BP Location:  Right Arm)   Pulse (!) 116   Temp 98.9 F (37.2 C) (Oral)   Resp 14   LMP 05/17/2024   SpO2 100%   Physical Exam Vitals and nursing note reviewed.   Gen: Appears uncomfortable Eyes: PERRL, EOMI HEENT: no oropharyngeal swelling Neck: trachea midline Resp: clear to auscultation bilaterally Card: Tachycardic, no murmurs, rubs, or gallops Abd: mild, diffuse tenderness, no guarding or rebound Extremities: no calf tenderness, no edema Vascular: 2+ radial pulses bilaterally, 2+ DP pulses bilaterally Skin: no rashes Psyc: acting appropriately   (all labs ordered are listed, but only abnormal  results are displayed) Labs Reviewed  OSMOLALITY - Abnormal; Notable for the following components:      Result Value   Osmolality 300 (*)    All other components within normal limits  URINALYSIS, ROUTINE W REFLEX MICROSCOPIC - Abnormal; Notable for the following components:   Color, Urine STRAW (*)    Glucose, UA >=500 (*)    Ketones, ur 5 (*)    Protein, ur 100 (*)    All other components within normal limits  BLOOD GAS, VENOUS - Abnormal; Notable for the following components:   pH, Ven 7.49 (*)    Bicarbonate 38.1 (*)    Acid-Base Excess 12.7 (*)    All other components within normal limits  CBC WITH DIFFERENTIAL/PLATELET - Abnormal; Notable for the following components:   WBC 13.3 (*)    Neutro Abs 11.7 (*)    All other components within normal limits  BASIC METABOLIC PANEL WITH GFR - Abnormal; Notable for the following components:   Potassium 3.2 (*)    Glucose, Bld 351 (*)    Calcium  7.4 (*)    All other components within normal limits  CBG MONITORING, ED - Abnormal; Notable for the following components:   Glucose-Capillary 433 (*)    All other components within normal limits  CBG MONITORING, ED - Abnormal; Notable for the following components:   Glucose-Capillary 335 (*)    All other components within normal limits  HCG, SERUM, QUALITATIVE  CBC WITH DIFFERENTIAL/PLATELET  BETA-HYDROXYBUTYRIC ACID  BETA-HYDROXYBUTYRIC ACID  BETA-HYDROXYBUTYRIC ACID  CBG MONITORING, ED    EKG: EKG Interpretation Date/Time:  Wednesday June 09 2024 10:58:44 EDT Ventricular Rate:  115 PR Interval:  133 QRS Duration:  72 QT Interval:  341 QTC Calculation: 472 R Axis:   62  Text Interpretation: Sinus tachycardia Confirmed by Ula Barter [phone]) on 06/09/2024 11:01:13 AM  Radiology: No results found.   Procedures   Medications Ordered in the ED  insulin  aspart (novoLOG ) injection 7 Units (has no administration in time range)  lactated ringers  bolus 1,000 mL (has no  administration in time range)  labetalol  (NORMODYNE ) injection 10 mg (has no administration in time range)  lactated ringers  bolus 1,000 mL (1,000 mLs Intravenous New Bag/Given 06/09/24 1220)  HYDROmorphone  (DILAUDID ) injection 0.5 mg (0.5 mg Intravenous Given 06/09/24 1214)  prochlorperazine  (COMPAZINE ) injection 10 mg (10 mg Intravenous Given 06/09/24 1214)  diphenhydrAMINE  (BENADRYL ) injection 12.5 mg (12.5 mg Intravenous Given 06/09/24 1214)  lactated ringers  bolus 1,000 mL (1,000 mLs Intravenous New Bag/Given 06/09/24 1424)  ketorolac  (TORADOL ) 15 MG/ML injection 15 mg (15 mg Intravenous Given 06/09/24 1414)  potassium chloride  SA (KLOR-CON  M) CR tablet 40 mEq (40 mEq Oral Given 06/09/24 1417)  Medical Decision Making 38 year old female with past medical history of diabetes and gastroparesis presenting to the emergency department today with abdominal pain, vomiting, and hyperglycemia.  I will further evaluate the patient here with basic labs as well as a blood gas and urinalysis to evaluate for DKA.  The patient is tachycardic here on arrival.  Will give her IV fluids.  Also give her low-dose Dilaudid  as well as Compazine  and Benadryl  for her symptoms while awaiting her labs to come back.  The patient's abdominal exam is relatively reassuring.  She has been seen in the emergency department [facility] times for similar complaints I will hold off on any imaging at this time.  The patient does have a mild leukocytosis which is nonspecific.  Her bicarbonate is normal and she does not have an anion gap.  The patient's heart rate remained elevated and she is given additional IV fluids.  She is feeling better on reassessment.  Glucose remains elevated and patient is given NovoLog .  Her blood pressure is significantly elevated here but back it does appear to be a chronic issue.  Will give her a dose of labetalol  here.  Plan is for reassessment after the above measures and  if symptoms have been controlled she will likely be discharged with return precautions.  Signed out to oncoming provider.  CRITICAL CARE Performed by: Prentice JONELLE Medicus   Total critical care time: 30 minutes  Critical care time was exclusive of separately billable procedures and treating other patients.  Critical care was necessary to treat or prevent imminent or life-threatening deterioration.  Critical care was time spent personally by me on the following activities: development of treatment plan with patient and/or surrogate as well as nursing, discussions with consultants, evaluation of patient's response to treatment, examination of patient, obtaining history from patient or surrogate, ordering and performing treatments and interventions, ordering and review of laboratory studies, ordering and review of radiographic studies, pulse oximetry and re-evaluation of patient's condition.   Amount and/or Complexity of Data Reviewed Labs: ordered.  Risk Prescription drug management.        Final diagnoses:  Hyperglycemia  Nausea and vomiting, unspecified vomiting type  Uncontrolled hypertension    ED Discharge Orders          Ordered    amLODipine (NORVASC) 5 MG tablet  Daily        06/09/24 1508    metoCLOPramide  (REGLAN ) 10 MG tablet  Every 8 hours PRN        06/09/24 1508               Medicus Prentice JONELLE, MD 06/09/24 1509    Medicus Prentice JONELLE, MD 06/09/24 [phone]

## 2024-06-28 ENCOUNTER — Encounter (HOSPITAL_COMMUNITY): Payer: Self-pay | Admitting: Emergency Medicine

## 2024-06-28 ENCOUNTER — Other Ambulatory Visit: Payer: Self-pay

## 2024-06-28 ENCOUNTER — Emergency Department (HOSPITAL_COMMUNITY)

## 2024-06-28 ENCOUNTER — Observation Stay (HOSPITAL_COMMUNITY)
Admission: EM | Admit: 2024-06-28 | Discharge: 2024-06-29 | Disposition: A | Discharge status: Home / Self Care | Attending: Internal Medicine | Admitting: Internal Medicine

## 2024-06-28 ENCOUNTER — Observation Stay (HOSPITAL_COMMUNITY)

## 2024-06-28 DIAGNOSIS — E1040 Type 1 diabetes mellitus with diabetic neuropathy, unspecified: Secondary | ICD-10-CM | POA: Diagnosis not present

## 2024-06-28 DIAGNOSIS — F1290 Cannabis use, unspecified, uncomplicated: Secondary | ICD-10-CM | POA: Diagnosis present

## 2024-06-28 DIAGNOSIS — R112 Nausea with vomiting, unspecified: Secondary | ICD-10-CM | POA: Diagnosis not present

## 2024-06-28 DIAGNOSIS — G894 Chronic pain syndrome: Secondary | ICD-10-CM | POA: Diagnosis not present

## 2024-06-28 DIAGNOSIS — E1069 Type 1 diabetes mellitus with other specified complication: Secondary | ICD-10-CM | POA: Diagnosis not present

## 2024-06-28 DIAGNOSIS — Z794 Long term (current) use of insulin: Secondary | ICD-10-CM | POA: Insufficient documentation

## 2024-06-28 DIAGNOSIS — F419 Anxiety disorder, unspecified: Secondary | ICD-10-CM | POA: Diagnosis not present

## 2024-06-28 DIAGNOSIS — E785 Hyperlipidemia, unspecified: Secondary | ICD-10-CM

## 2024-06-28 LAB — CBC WITH DIFFERENTIAL/PLATELET
Abs Immature Granulocytes: 0.02 K/uL (ref 0.00–0.07)
Basophils Absolute: 0 K/uL (ref 0.0–0.1)
Basophils Relative: 1 %
Eosinophils Absolute: 0 K/uL (ref 0.0–0.5)
Eosinophils Relative: 0 %
HCT: 40 % (ref 36.0–46.0)
Hemoglobin: 14.1 g/dL (ref 12.0–15.0)
Immature Granulocytes: 0 %
Lymphocytes Relative: 27 %
Lymphs Abs: 1.7 K/uL (ref 0.7–4.0)
MCH: 30.6 pg (ref 26.0–34.0)
MCHC: 35.3 g/dL (ref 30.0–36.0)
MCV: 86.8 fL (ref 80.0–100.0)
Monocytes Absolute: 0.3 K/uL (ref 0.1–1.0)
Monocytes Relative: 5 %
Neutro Abs: 4.2 K/uL (ref 1.7–7.7)
Neutrophils Relative %: 67 %
Platelets: 311 K/uL (ref 150–400)
RBC: 4.61 MIL/uL (ref 3.87–5.11)
RDW: 13.1 % (ref 11.5–15.5)
WBC: 6.3 K/uL (ref 4.0–10.5)
nRBC: 0 % (ref 0.0–0.2)

## 2024-06-28 LAB — COMPREHENSIVE METABOLIC PANEL WITH GFR
ALT: 13 U/L (ref 0–44)
AST: 17 U/L (ref 15–41)
Albumin: 4.2 g/dL (ref 3.5–5.0)
Alkaline Phosphatase: 59 U/L (ref 38–126)
Anion gap: 22 — ABNORMAL HIGH (ref 5–15)
BUN: 5 mg/dL — ABNORMAL LOW (ref 6–20)
CO2: 22 mmol/L (ref 22–32)
Calcium: 10.1 mg/dL (ref 8.9–10.3)
Chloride: 95 mmol/L — ABNORMAL LOW (ref 98–111)
Creatinine, Ser: 0.8 mg/dL (ref 0.44–1.00)
GFR, Estimated: >60 mL/min (ref >60)
Glucose, Bld: 312 mg/dL — ABNORMAL HIGH (ref 70–99)
Potassium: 3.5 mmol/L (ref 3.5–5.1)
Sodium: 139 mmol/L (ref 135–145)
Total Bilirubin: 1.3 mg/dL — ABNORMAL HIGH (ref 0.0–1.2)
Total Protein: 7.6 g/dL (ref 6.5–8.1)

## 2024-06-28 LAB — I-STAT VENOUS BLOOD GAS, ED
Acid-Base Excess: 3 mmol/L — ABNORMAL HIGH (ref 0.0–2.0)
Bicarbonate: 24.1 mmol/L (ref 20.0–28.0)
Calcium, Ion: 1.11 mmol/L — ABNORMAL LOW (ref 1.15–1.40)
HCT: 43 % (ref 36.0–46.0)
Hemoglobin: 14.6 g/dL (ref 12.0–15.0)
O2 Saturation: 73 %
Potassium: 3.3 mmol/L — ABNORMAL LOW (ref 3.5–5.1)
Sodium: 136 mmol/L (ref 135–145)
TCO2: 25 mmol/L (ref 22–32)
pCO2, Ven: 27.2 mmHg — ABNORMAL LOW (ref 44–60)
pH, Ven: 7.555 — ABNORMAL HIGH (ref 7.25–7.43)
pO2, Ven: 32 mmHg (ref 32–45)

## 2024-06-28 LAB — I-STAT CHEM 8, ED
BUN: 8 mg/dL (ref 6–20)
Calcium, Ion: 1.14 mmol/L — ABNORMAL LOW (ref 1.15–1.40)
Chloride: 98 mmol/L (ref 98–111)
Creatinine, Ser: 0.6 mg/dL (ref 0.44–1.00)
Glucose, Bld: 333 mg/dL — ABNORMAL HIGH (ref 70–99)
HCT: 43 % (ref 36.0–46.0)
Hemoglobin: 14.6 g/dL (ref 12.0–15.0)
Potassium: 3.3 mmol/L — ABNORMAL LOW (ref 3.5–5.1)
Sodium: 137 mmol/L (ref 135–145)
TCO2: 24 mmol/L (ref 22–32)

## 2024-06-28 LAB — CBG MONITORING, ED: Glucose-Capillary: 332 mg/dL — ABNORMAL HIGH (ref 70–99)

## 2024-06-28 LAB — BETA-HYDROXYBUTYRIC ACID: Beta-Hydroxybutyric Acid: 3.97 mmol/L — ABNORMAL HIGH (ref 0.05–0.27)

## 2024-06-28 LAB — HCG, SERUM, QUALITATIVE: Preg, Serum: NEGATIVE

## 2024-06-28 MED ORDER — DIPHENHYDRAMINE HCL 50 MG/ML IJ SOLN
25.0000 mg | Freq: Once | INTRAMUSCULAR | Status: AC
  Administered 2024-06-28: 25 mg via INTRAVENOUS
  Filled 2024-06-28: qty 1

## 2024-06-28 MED ORDER — POTASSIUM CHLORIDE CRYS ER 20 MEQ PO TBCR
40.0000 meq | EXTENDED_RELEASE_TABLET | Freq: Once | ORAL | Status: AC
  Administered 2024-06-28: 40 meq via ORAL
  Filled 2024-06-28: qty 2

## 2024-06-28 MED ORDER — FENTANYL CITRATE (PF) 50 MCG/ML IJ SOSY
50.0000 ug | PREFILLED_SYRINGE | Freq: Once | INTRAMUSCULAR | Status: AC
  Administered 2024-06-28: 50 ug via INTRAVENOUS
  Filled 2024-06-28: qty 1

## 2024-06-28 MED ORDER — DULOXETINE HCL 20 MG PO CPEP
60.0000 mg | ORAL_CAPSULE | Freq: Every day | ORAL | Status: DC
  Administered 2024-06-28: 60 mg via ORAL
  Filled 2024-06-28: qty 2

## 2024-06-28 MED ORDER — BISACODYL 5 MG PO TBEC
5.0000 mg | DELAYED_RELEASE_TABLET | Freq: Every day | ORAL | Status: DC | PRN
Start: 2024-06-28 — End: 2024-06-29

## 2024-06-28 MED ORDER — ENOXAPARIN SODIUM 40 MG/0.4ML IJ SOSY
40.0000 mg | PREFILLED_SYRINGE | INTRAMUSCULAR | Status: DC
  Administered 2024-06-29: 40 mg via SUBCUTANEOUS
  Filled 2024-06-28: qty 0.4

## 2024-06-28 MED ORDER — SENNOSIDES-DOCUSATE SODIUM 8.6-50 MG PO TABS: 1.0000 | ORAL_TABLET | Freq: Every evening | ORAL | Status: DC | PRN

## 2024-06-28 MED ORDER — INSULIN GLARGINE-YFGN 100 UNIT/ML ~~LOC~~ SOLN
20.0000 [IU] | Freq: Every day | SUBCUTANEOUS | Status: DC
  Administered 2024-06-28: 20 [IU] via SUBCUTANEOUS
  Filled 2024-06-28 (×2): qty 0.2

## 2024-06-28 MED ORDER — OXYCODONE HCL 5 MG PO TABS
10.0000 mg | ORAL_TABLET | Freq: Four times a day (QID) | ORAL | Status: DC | PRN
  Filled 2024-06-28: qty 2

## 2024-06-28 MED ORDER — ONDANSETRON HCL 4 MG/2ML IJ SOLN
4.0000 mg | Freq: Four times a day (QID) | INTRAMUSCULAR | Status: DC | PRN
  Administered 2024-06-29: 4 mg via INTRAVENOUS
  Filled 2024-06-28: qty 2

## 2024-06-28 MED ORDER — LACTATED RINGERS IV SOLN: INTRAVENOUS | Status: DC

## 2024-06-28 MED ORDER — ACETAMINOPHEN 650 MG RE SUPP: 650.0000 mg | Freq: Four times a day (QID) | RECTAL | Status: DC | PRN

## 2024-06-28 MED ORDER — LACTATED RINGERS IV BOLUS
1000.0000 mL | Freq: Once | INTRAVENOUS | Status: AC
  Administered 2024-06-28: 1000 mL via INTRAVENOUS

## 2024-06-28 MED ORDER — FLEET ENEMA RE ENEM
1.0000 | ENEMA | Freq: Once | RECTAL | Status: DC | PRN
Start: 2024-06-28 — End: 2024-06-29

## 2024-06-28 MED ORDER — DROPERIDOL 2.5 MG/ML IJ SOLN
1.2500 mg | Freq: Once | INTRAMUSCULAR | Status: AC
  Administered 2024-06-28: 1.25 mg via INTRAVENOUS
  Filled 2024-06-28: qty 2

## 2024-06-28 MED ORDER — PANTOPRAZOLE SODIUM 40 MG IV SOLR
40.0000 mg | INTRAVENOUS | Status: DC
  Administered 2024-06-29: 40 mg via INTRAVENOUS
  Filled 2024-06-28: qty 10

## 2024-06-28 MED ORDER — METOCLOPRAMIDE HCL 5 MG/ML IJ SOLN
10.0000 mg | Freq: Three times a day (TID) | INTRAMUSCULAR | Status: DC
Start: 2024-06-29 — End: 2024-06-29
  Administered 2024-06-29: 10 mg via INTRAVENOUS
  Filled 2024-06-28 (×2): qty 2

## 2024-06-28 MED ORDER — PROCHLORPERAZINE EDISYLATE 10 MG/2ML IJ SOLN
5.0000 mg | Freq: Four times a day (QID) | INTRAMUSCULAR | Status: DC | PRN
  Administered 2024-06-29: 5 mg via INTRAVENOUS
  Filled 2024-06-28: qty 2

## 2024-06-28 MED ORDER — PROCHLORPERAZINE EDISYLATE 10 MG/2ML IJ SOLN
10.0000 mg | Freq: Once | INTRAMUSCULAR | Status: AC
  Administered 2024-06-28: 10 mg via INTRAVENOUS
  Filled 2024-06-28: qty 2

## 2024-06-28 MED ORDER — ACETAMINOPHEN 325 MG PO TABS: 650.0000 mg | ORAL_TABLET | Freq: Four times a day (QID) | ORAL | Status: DC | PRN

## 2024-06-28 MED ORDER — DROPERIDOL 2.5 MG/ML IJ SOLN: 1.2500 mg | Freq: Once | INTRAMUSCULAR | Status: DC

## 2024-06-28 MED ORDER — INSULIN ASPART 100 UNIT/ML IJ SOLN
0.0000 [IU] | INTRAMUSCULAR | Status: DC
  Administered 2024-06-29 (×2): 3 [IU] via SUBCUTANEOUS
  Administered 2024-06-29: 5 [IU] via SUBCUTANEOUS
  Administered 2024-06-29: 1 [IU] via SUBCUTANEOUS
  Filled 2024-06-28: qty 5
  Filled 2024-06-28: qty 1
  Filled 2024-06-28 (×2): qty 3

## 2024-06-28 MED ORDER — METOCLOPRAMIDE HCL 5 MG/ML IJ SOLN
10.0000 mg | Freq: Once | INTRAMUSCULAR | Status: AC
  Administered 2024-06-28: 10 mg via INTRAVENOUS
  Filled 2024-06-28: qty 2

## 2024-06-28 MED ORDER — HYDROMORPHONE HCL 1 MG/ML IJ SOLN
1.0000 mg | Freq: Once | INTRAMUSCULAR | Status: AC
  Administered 2024-06-28: 1 mg via INTRAVENOUS
  Filled 2024-06-28: qty 1

## 2024-06-28 NOTE — H&P (Signed)
 History and Physical    [name] FMW:[id number] DOB: [birth year] DOA: 06/28/2024  PCP: Cristopher Suzen HERO, NP   Patient coming from: Home   Chief Complaint: N/V, abdominal pain   HPI: [name] is a 38 y.o. 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  Patient reports that she began developing nausea and general malaise on 06/25/2024.  She went on to have numerous episodes of nonbloody vomiting.  Since then, she continues to have generalized abdominal discomfort, nausea, and frequent bouts of vomiting.  She states that she has been unable to tolerate anything by mouth.  She reports that the symptoms are the same as what she has experienced many times previously.  Patient is unaware of any diagnosis for her condition but notes that she is scheduled to see [facility] in December.  She denies any fevers, diarrhea, chest pain, urinary symptoms, or cough.  ED Course: Upon arrival to the ED, patient is found to be afebrile and saturating well on room air with elevated heart rate and elevated blood pressure.  Labs are most notable for normal BUN and creatinine, glucose 312, anion gap 22, total bilirubin 1.3, normal CBC, and negative pregnancy test.  Chest x-ray is negative for acute findings.  Patient was treated in the ED with IV fluids, potassium, Reglan , Compazine , Benadryl , droperidol , fentanyl , and Dilaudid .  Review of Systems:  All other systems reviewed and apart from HPI, are negative.  Past Medical History:  Diagnosis Date   Chronic constipation 11/08/2021   Diabetic neuropathy (HCC)    History of chicken pox    History of noncompliance with medical treatment 09/07/2022   Type 1 diabetes (HCC)    Vaginal odor  11/08/2021    Past Surgical History:  Procedure Laterality Date   CESAREAN SECTION     only one C/S per pt.   CO2 LASER APPLICATION N/A 02/22/2022   Procedure: CO2 LASER APPLICATION TO VULVA;  Surgeon: Viktoria Comer SAUNDERS, MD;  Location: [facility];  Service: General;  Laterality: N/A;   DILATION AND CURETTAGE OF UTERUS     EXCISION OF SKIN TAG N/A 02/22/2022   Procedure: EXCISION OF ANAL SKIN TAGS;  Surgeon: Debby Hila, MD;  Location: [facility];  Service: General;  Laterality: N/A;    Social History:   reports that she has never smoked. She has never been exposed to tobacco smoke. She has never used smokeless tobacco. She reports current drug use. Drug: Marijuana. She reports that she does not drink alcohol.  Allergies  Allergen Reactions   Mirapex [Pramipexole] Shortness Of Breath    Family History  Problem Relation Age of Onset   Hypertension Mother    Glaucoma Mother    Diabetes Maternal Grandmother    Neuropathy Neg Hx    Colon cancer Neg Hx    Stomach cancer Neg Hx    Esophageal cancer Neg Hx    Colon polyps Neg Hx    Rectal cancer Neg Hx      Prior to Admission medications   Medication Sig Start Date End Date Taking? Authorizing Provider  Acetaminophen  (MIDOL  PO) Take 1 tablet by mouth every 6 (six) hours as needed (cramping).    [provider]  amLODipine (NORVASC) 5 MG tablet Take  1 tablet (5 mg total) by mouth daily for 15 days. 06/09/24 06/24/24  Ula Prentice SAUNDERS, MD  Continuous Glucose Sensor (DEXCOM G7 SENSOR) MISC Inject 1 Device into the skin See admin instructions. Place 1 new sensor into the skin every 10 days    [provider]  DULoxetine  (CYMBALTA ) 60 MG capsule Take 60 mg by mouth at bedtime. 06/28/22   [provider]  insulin  aspart (NOVOLOG ) 100 UNIT/ML FlexPen Inject 0-9 Units into the skin 3 (three) times daily with meals. CBG < 70: Eat or drink something sweet and recheck, CBG 70 - 120: 0  units CBG 121 - 150: 1 unit CBG 151 - 200: 2 units CBG 201 - 250: 3 units CBG 251 - 300: 5 units CBG 301 - 350: 7 units CBG 351 - 400: 9 units CBG > 400: call MD. 08/07/23 04/25/24  [name], MD  insulin  glargine (LANTUS ) 100 UNIT/ML Solostar Pen Inject 12 Units into the skin daily. May substitute as needed per insurance. Patient taking differently: Inject 40 Units into the skin 2 (two) times daily. 10/15/23 04/25/24  [place], Camellia PARAS, DO  metoCLOPramide  (REGLAN ) 10 MG tablet Take 1 tablet (10 mg total) by mouth every 6 (six) hours. 05/24/24   Smoot, Lauraine LABOR, PA-C  metoCLOPramide  (REGLAN ) 10 MG tablet Take 1 tablet (10 mg total) by mouth every 8 (eight) hours as needed for nausea. 06/09/24   Ula Prentice SAUNDERS, MD  ondansetron  (ZOFRAN ) 4 MG tablet Take 1 tablet (4 mg total) by mouth every 6 (six) hours. 03/20/24   Jerral Meth, MD  oxyCODONE  (ROXICODONE ) 15 MG immediate release tablet Take 15 mg by mouth 4 (four) times daily. 04/28/24   [provider]  pantoprazole  (PROTONIX ) 40 MG tablet Take 1 tablet (40 mg total) by mouth daily. 04/26/24 04/26/25  Pokhrel, Laxman, MD  polyethylene glycol (MIRALAX  / GLYCOLAX ) 17 g packet Take 17 g by mouth daily as needed for mild constipation. 04/26/24   Pokhrel, Laxman, MD  promethazine  (PHENERGAN ) 25 MG suppository Place 1 suppository (25 mg total) rectally every 6 (six) hours as needed for nausea or vomiting. 02/22/24   Bari [name], MD    Physical Exam: Vitals:   06/28/24 2130 06/28/24 2145 06/28/24 2200 06/28/24 2219  BP: (!) 178/98 (!) 169/98 (!) 176/103   Pulse: (!) 121 (!) 115 (!) 115   Resp: 18 (!) 21 17   Temp:    98.8 F (37.1 C)  TempSrc:    Oral  SpO2: 100% 99% 100%     Constitutional: NAD, no pallor or diaphoresis   Eyes: PERTLA, lids and conjunctivae normal ENMT: Mucous membranes are moist. Posterior pharynx clear of any exudate or lesions.   Neck: supple, no masses  Respiratory: no wheezing, no crackles. No accessory muscle use.   Cardiovascular: Rate ~120 and regular. No extremity edema.  Abdomen: Soft, no guarding. Bowel sounds active.  Musculoskeletal: no clubbing / cyanosis. No joint deformity upper and lower extremities.   Skin: no significant rashes, lesions, ulcers. Warm, dry, well-perfused. Neurologic: CN 2-12 grossly intact. Moving all extremities. Sleeping, wakes to voice. Oriented to person, place, and situation but quickly falls asleep after answering questions.  Psychiatric: Calm. Cooperative.    Labs and Imaging on Admission: I have personally reviewed following labs and imaging studies  CBC: Recent Labs  Lab 06/28/24 1851 06/28/24 1858  WBC 6.3  --   NEUTROABS 4.2  --   HGB 14.1 14.6  14.6  HCT 40.0  43.0  43.0  MCV 86.8  --   PLT 311  --    Basic Metabolic Panel: Recent Labs  Lab 06/28/24 1851 06/28/24 1858  NA 139 137  136  K 3.5 3.3*  3.3*  CL 95* 98  CO2 22  --   GLUCOSE 312* 333*  BUN 5* 8  CREATININE 0.80 0.60  CALCIUM  10.1  --    GFR: CrCl cannot be calculated (Unknown ideal weight.). Liver Function Tests: Recent Labs  Lab 06/28/24 1851  AST 17  ALT 13  ALKPHOS 59  BILITOT 1.3*  PROT 7.6  ALBUMIN 4.2   No results for input(s): LIPASE, AMYLASE in the last 168 hours. No results for input(s): AMMONIA in the last 168 hours. Coagulation Profile: No results for input(s): INR, PROTIME in the last 168 hours. Cardiac Enzymes: No results for input(s): CKTOTAL, CKMB, CKMBINDEX, TROPONINI in the last 168 hours. BNP (last 3 results) No results for input(s): PROBNP in the last 8760 hours. HbA1C: No results for input(s): HGBA1C in the last 72 hours. CBG: Recent Labs  Lab 06/28/24 1822  GLUCAP 332*   Lipid Profile: No results for input(s): CHOL, HDL, LDLCALC, TRIG, CHOLHDL, LDLDIRECT in the last 72 hours. Thyroid  Function Tests: No results for input(s): TSH, T4TOTAL, FREET4, T3FREE, THYROIDAB in the last 72 hours. Anemia  Panel: No results for input(s): VITAMINB12, FOLATE, FERRITIN, TIBC, IRON, RETICCTPCT in the last 72 hours. Urine analysis:    Component Value Date/Time   COLORURINE STRAW (A) 06/09/2024 1204   APPEARANCEUR CLEAR 06/09/2024 1204   LABSPEC 1.021 06/09/2024 1204   PHURINE 8.0 06/09/2024 1204   GLUCOSEU >=500 (A) 06/09/2024 1204   HGBUR NEGATIVE 06/09/2024 1204   BILIRUBINUR NEGATIVE 06/09/2024 1204   BILIRUBINUR negative 11/08/2021 1424   KETONESUR 5 (A) 06/09/2024 1204   PROTEINUR 100 (A) 06/09/2024 1204   UROBILINOGEN 0.2 11/08/2021 1424   UROBILINOGEN 0.2 05/03/2021 0837   NITRITE NEGATIVE 06/09/2024 1204   LEUKOCYTESUR NEGATIVE 06/09/2024 1204   Sepsis Labs: @LABRCNTIP (procalcitonin:4,lacticidven:4) )No results found for this or any previous visit (from the past 240 hours).   Radiological Exams on Admission: DG Chest Portable 1 View Result Date: 06/28/2024 EXAM: 1 VIEW(S) XRAY OF THE CHEST 06/28/2024 07:05:00 PM COMPARISON: 03/21/2024 CLINICAL HISTORY: sob FINDINGS: LUNGS AND PLEURA: No focal pulmonary opacity. No pulmonary edema. No pleural effusion. No pneumothorax. HEART AND MEDIASTINUM: No acute abnormality of the cardiac and mediastinal silhouettes. BONES AND SOFT TISSUES: No acute osseous abnormality. IMPRESSION: 1. No acute cardiopulmonary process. Electronically signed by: Norman Gatlin MD 06/28/2024 08:07 PM EST RP Workstation: [id number]    EKG: Independently reviewed. Sinus tachycardia, rate 134.   Assessment/Plan   1. Abdominal pain and intractable N/V  - Patient reports sxs are same as prior admissions with no fever or urinary symptoms  - Check KUB, lipase, and UDS, continue supportive care with IVF and antiemetics   2. Chronic pain  - Prescription database reviewed, planned to continue oxycodone     3. Type I DM  - A1c was 7.9% in September 2025 - Check CBGs and use basal and correctional insulin     4. Mood disorder  - Continue Cymbalta       DVT prophylaxis: Lovenox   Code Status: Full  Level of Care: Level of care: Telemetry Family Communication: None present  Disposition Plan:  Patient is from: Home  Anticipated d/c is to: Home  Anticipated d/c date is: 11/11 or 06/30/24 Patient currently: Pending tolerance of adequate oral intake  Consults called:  None  Admission status: Observation     Caitlyn GORMAN Sprinkles, MD [facility]  06/28/2024, 11:11 PM

## 2024-06-28 NOTE — ED Triage Notes (Signed)
 Pt pulled from car requiring sternal rub to rouse.  Pt is lethargic but alert and tearful complaining of abdominal pain that she associates with diabetic gastroporesis.  10/10 abd pain. Pt is A&O at this time.

## 2024-06-28 NOTE — ED Provider Notes (Signed)
 [place] EMERGENCY DEPARTMENT AT [facility] Provider Note  MDM   HPI/ROS:  [name] is a 38 y.o. female with a PMH of T1DM with gastroparesis, neuropathy, cannabinoid hyperemesis, GERD, history of high-grade vulvar dysplasia s/p laser ablation who presents to the hospital with intractable nausea, vomiting and diffuse abdominal pain.  She reports that she had some general nausea yesterday but had increasingly worse pain today, she states that she tried some Zofran , does not have any Reglan .  Denies fevers, chest pain, shortness of breath, dysuria, hematuria, bloody or black stools, constipation, diarrhea.  States that she is still passing gas.  States that this feels like similar episodes of prior cyclical vomiting or pain due to her gastroparesis.  Differentials include but not limited to PUD, gastritis, pancreatitis, gastroparesis, cyclical vomiting/cannabinol hyperemesis, biliary disease, ACS, pericarditis, pneumonia, intestinal ischemia   On my initial evaluation, patient is:  -Vital signs significant for hypertension and tachycardia. Patient afebrile, hemodynamically stable, and uncomfortable appearing.  Physical exam is notable for: - Skin turgor is appropriate, mucous membranes are moist -- Abdomen is soft, nondistended, nonrigid, no focal point tenderness, negative McBurney's or Murphy's point tenderness.  Some diffuse abdominal tenderness to deep palpation without rebound or guarding  On previous evaluations, patient has been seen in the emergency department multiple times for similar presentations with intractable nausea/vomiting secondary to gastroparesis. This patient's current presentation, including their history and physical exam, is most consistent with intractable nausea and vomiting.  CMP with slightly elevated glucose at 312, and elevated anion gap at 22.  VBG 7.555/27.2/24.1.  No signs of diabetic ketoacidosis, patient is not acidotic, anion gap is  likely secondary to volume contraction for recurrent losses from nausea and vomiting.  Beta-hCG is negative, CBC without any abnormalities.  Given her prior ED visits without any acute change in her presentation or abdominal exam, low suspicion for acute intra-abdominal pathology requiring CT imaging.  Therefore after discussion with patient, CT imaging deferred at this time.   Beta hydroxybutyrate elevated at 3.7.  Patient given multiple doses of IV pain medication as well as IV antiemetics without resolution of her symptoms.  At this time given intractable nausea and vomiting, patient discussed with hospital service for admittance.  Interpretations, interventions, and the patient's course of care are documented below.    Clinical Course as of 06/28/24 2231  Mon Jun 28, 2024  1950 Sinus tachycardia, no acute ischemia, QTc 480 [SA]    Clinical Course User Index [SA] Caitlyn Pal, MD     Disposition:  I discussed the case with [name] who graciously agreed to admit the patient to their service for continued care.   Clinical Impression:  1. Intractable nausea and vomiting      Clinical Complexity A medically appropriate history, review of systems, and physical exam was performed.  My independent interpretations of EKG, labs, and radiology are documented in the ED course above.   If decision rules were used in this patient's evaluation, they are listed below.   Click here for ABCD2, HEART and other calculatorsREFRESH Note before signing   Patient's presentation is most consistent with severe exacerbation of chronic illness.  Medical Decision Making Amount and/or Complexity of Data Reviewed Labs: ordered. Radiology: ordered.  Risk Prescription drug management. Decision regarding hospitalization.    HPI/ROS      See MDM section for pertinent HPI and ROS. A complete ROS was performed with pertinent positives/negatives noted above.   Past Medical History:  Diagnosis Date  Chronic constipation 11/08/2021   Diabetic neuropathy (HCC)    History of chicken pox    History of noncompliance with medical treatment 09/07/2022   Type 1 diabetes (HCC)    Vaginal odor 11/08/2021    Past Surgical History:  Procedure Laterality Date   CESAREAN SECTION     only one C/S per pt.   CO2 LASER APPLICATION N/A 02/22/2022   Procedure: CO2 LASER APPLICATION TO VULVA;  Surgeon: Caitlyn Comer SAUNDERS, MD;  Location: [facility];  Service: General;  Laterality: N/A;   DILATION AND CURETTAGE OF UTERUS     EXCISION OF SKIN TAG N/A 02/22/2022   Procedure: EXCISION OF ANAL SKIN TAGS;  Surgeon: Caitlyn Hila, MD;  Location: [facility];  Service: General;  Laterality: N/A;      Physical Exam   Vitals:   06/28/24 1825 06/28/24 1830  BP: (!) 133/113 (!) 155/133  Pulse: (!) 133 (!) 126  Resp:  19  SpO2: 100% 100%    Physical Exam Vitals and nursing note reviewed.  Constitutional:      General: She is not in acute distress.    Appearance: She is well-developed.  HENT:     Head: Normocephalic and atraumatic.  Eyes:     Conjunctiva/sclera: Conjunctivae normal.  Cardiovascular:     Rate and Rhythm: Normal rate and regular rhythm.     Heart sounds: No murmur heard. Pulmonary:     Effort: Pulmonary effort is normal. No respiratory distress.     Breath sounds: Normal breath sounds.  Abdominal:     Comments: Abdomen is soft, nondistended, nonrigid, no focal point tenderness, negative McBurney's or Murphy's point tenderness.  Some diffuse abdominal tenderness to deep palpation without rebound or guarding  Musculoskeletal:        General: No swelling.     Cervical back: Neck supple.  Skin:    General: Skin is warm and dry.     Capillary Refill: Capillary refill takes less than 2 seconds.  Neurological:     Mental Status: She is alert.  Psychiatric:        Mood and Affect: Mood normal.    Procedures   If procedures were preformed on this  patient, they are listed below:  Procedures   Please note that this documentation was produced with the assistance of voice-to-text technology and may contain errors.     Caitlyn Pal, MD 06/28/24 7698    Tegeler, Lonni PARAS, MD 06/29/24 0020

## 2024-06-29 DIAGNOSIS — R110 Nausea: Secondary | ICD-10-CM | POA: Diagnosis not present

## 2024-06-29 DIAGNOSIS — R1111 Vomiting without nausea: Secondary | ICD-10-CM | POA: Diagnosis not present

## 2024-06-29 DIAGNOSIS — E1069 Type 1 diabetes mellitus with other specified complication: Secondary | ICD-10-CM | POA: Diagnosis not present

## 2024-06-29 DIAGNOSIS — E785 Hyperlipidemia, unspecified: Secondary | ICD-10-CM | POA: Diagnosis not present

## 2024-06-29 DIAGNOSIS — R112 Nausea with vomiting, unspecified: Secondary | ICD-10-CM | POA: Diagnosis not present

## 2024-06-29 DIAGNOSIS — I10 Essential (primary) hypertension: Secondary | ICD-10-CM | POA: Diagnosis not present

## 2024-06-29 DIAGNOSIS — R000 Tachycardia, unspecified: Secondary | ICD-10-CM | POA: Diagnosis not present

## 2024-06-29 DIAGNOSIS — Z7401 Bed confinement status: Secondary | ICD-10-CM | POA: Diagnosis not present

## 2024-06-29 LAB — HEPATIC FUNCTION PANEL
ALT: 6 U/L (ref 0–44)
AST: 16 U/L (ref 15–41)
Albumin: 4.1 g/dL (ref 3.5–5.0)
Alkaline Phosphatase: 66 U/L (ref 38–126)
Bilirubin, Direct: 0.2 mg/dL (ref 0.0–0.2)
Indirect Bilirubin: 0.4 mg/dL (ref 0.3–0.9)
Total Bilirubin: 0.6 mg/dL (ref 0.0–1.2)
Total Protein: 7.2 g/dL (ref 6.5–8.1)

## 2024-06-29 LAB — BASIC METABOLIC PANEL WITH GFR
Anion gap: 19 — ABNORMAL HIGH (ref 5–15)
BUN: 9 mg/dL (ref 6–20)
CO2: 20 mmol/L — ABNORMAL LOW (ref 22–32)
Calcium: 9.7 mg/dL (ref 8.9–10.3)
Chloride: 96 mmol/L — ABNORMAL LOW (ref 98–111)
Creatinine, Ser: 0.8 mg/dL (ref 0.44–1.00)
GFR, Estimated: >60 mL/min (ref >60)
Glucose, Bld: 370 mg/dL — ABNORMAL HIGH (ref 70–99)
Potassium: 3.8 mmol/L (ref 3.5–5.1)
Sodium: 135 mmol/L (ref 135–145)

## 2024-06-29 LAB — CBC
HCT: 36.7 % (ref 36.0–46.0)
Hemoglobin: 12.7 g/dL (ref 12.0–15.0)
MCH: 30.9 pg (ref 26.0–34.0)
MCHC: 34.6 g/dL (ref 30.0–36.0)
MCV: 89.3 fL (ref 80.0–100.0)
Platelets: 248 K/uL (ref 150–400)
RBC: 4.11 MIL/uL (ref 3.87–5.11)
RDW: 13.5 % (ref 11.5–15.5)
WBC: 8.8 K/uL (ref 4.0–10.5)
nRBC: 0 % (ref 0.0–0.2)

## 2024-06-29 LAB — GLUCOSE, CAPILLARY
Glucose-Capillary: 357 mg/dL — ABNORMAL HIGH (ref 70–99)
Glucose-Capillary: 295 mg/dL — ABNORMAL HIGH (ref 70–99)
Glucose-Capillary: 174 mg/dL — ABNORMAL HIGH (ref 70–99)

## 2024-06-29 LAB — CBG MONITORING, ED: Glucose-Capillary: 257 mg/dL — ABNORMAL HIGH (ref 70–99)

## 2024-06-29 LAB — LIPASE, BLOOD: Lipase: 13 U/L (ref 11–51)

## 2024-06-29 LAB — MAGNESIUM: Magnesium: 1.8 mg/dL (ref 1.7–2.4)

## 2024-06-29 MED ORDER — KETOROLAC TROMETHAMINE 15 MG/ML IJ SOLN: 15.0000 mg | Freq: Once | INTRAMUSCULAR | Status: DC

## 2024-06-29 MED ORDER — LABETALOL HCL 5 MG/ML IV SOLN: 10.0000 mg | INTRAVENOUS | Status: DC | PRN

## 2024-06-29 MED ORDER — KETOROLAC TROMETHAMINE 15 MG/ML IJ SOLN
15.0000 mg | Freq: Once | INTRAMUSCULAR | Status: AC
  Administered 2024-06-29: 15 mg via INTRAVENOUS
  Filled 2024-06-29: qty 1

## 2024-06-29 MED ORDER — HYDRALAZINE HCL 20 MG/ML IJ SOLN: 10.0000 mg | INTRAMUSCULAR | Status: DC | PRN

## 2024-06-29 NOTE — Hospital Course (Signed)
 [name] is a 38 y.o. female with medical history significant for type 1 diabetes mellitus, high-grade vulvar dysplasia status post laser ablation, mood disorder, GERD, chronic pain, and frequent episodes of abdominal pain with nausea and vomiting who presented to the ED with abdominal pain, nausea, and vomiting.   Patient reports that she began developing nausea and general malaise on 06/25/2024.  She went on to have numerous episodes of nonbloody vomiting.  Since then, she continued to have generalized abdominal discomfort, nausea, and frequent bouts of vomiting.  She states that she has been unable to tolerate anything by mouth.  She reports that the symptoms are the same as what she has experienced many times previously.   She does endorse having been previously told about probable gastroparesis but has not had a gastric emptying study yet. She has an upcoming appointment in December with [facility] to establish care. Has not had any prior endoscopy procedures either.  She was treated for symptom management and improved.  She was able to tolerate a diet prior to discharge and felt comfortable and stable discharging home with ongoing diet advancement outpatient.

## 2024-06-29 NOTE — ED Notes (Signed)
 [facility] called.

## 2024-06-29 NOTE — Progress Notes (Signed)
   06/29/24 0851  TOC Brief Assessment  Insurance and Status Reviewed  Patient has primary care physician Yes  Home environment has been reviewed single family home  Prior level of function: independent  Prior/Current Home Services No current home services  Social Drivers of Health Review SDOH reviewed no interventions necessary  Readmission risk has been reviewed Yes  Transition of care needs no transition of care needs at this time    Signed: Heather Saltness, MSW, LCSW Clinical Social Worker Inpatient Care Management 06/29/2024 8:51 AM

## 2024-06-29 NOTE — Discharge Summary (Signed)
 Physician Discharge Summary   [name] FMW:[id number] DOB: 1985/09/03 DOA: 06/28/2024  PCP: Cristopher Suzen HERO, NP  Admit date: 06/28/2024 Discharge date: 06/29/2024  Admitted From: Home Disposition:  Home Discharging physician: Alm Apo, MD Barriers to discharge: none  Recommendations at discharge: Establish care with GI in December Consider gastric emptying study and/or EGD   Discharge Condition: stable CODE STATUS: Full  Diet recommendation:  Diet Orders (From admission, onward)     Start     Ordered   06/29/24 0000  Diet Carb Modified        06/29/24 1136   06/28/24 2310  Diet clear liquid Room service appropriate? Yes; Fluid consistency: Thin  Diet effective now       Question Answer Comment  Room service appropriate? Yes   Fluid consistency: Thin      06/28/24 2310            Hospital Course: [name] is a 38 y.o. female with medical history significant for type 1 diabetes mellitus, high-grade vulvar dysplasia status post laser ablation, mood disorder, GERD, chronic pain, and frequent episodes of abdominal pain with nausea and vomiting who presented to the ED with abdominal pain, nausea, and vomiting.   Patient reports that she began developing nausea and general malaise on 06/25/2024.  She went on to have numerous episodes of nonbloody vomiting.  Since then, she continued to have generalized abdominal discomfort, nausea, and frequent bouts of vomiting.  She states that she has been unable to tolerate anything by mouth.  She reports that the symptoms are the same as what she has experienced many times previously.   She does endorse having been previously told about probable gastroparesis but has not had a gastric emptying study yet. She has an upcoming appointment in December with  GI to establish care. Has not had any prior endoscopy procedures either.  She was treated for symptom management and improved.  She was able  to tolerate a diet prior to discharge and felt comfortable and stable discharging home with ongoing diet advancement outpatient.   The patient's acute and chronic medical conditions were treated accordingly. On day of discharge, patient was felt deemed stable for discharge. Patient/family member advised to call PCP or come back to ER if needed.  Principal Diagnosis: Intractable nausea and vomiting  Discharge Diagnoses: Active Hospital Problems   Diagnosis Date Noted   Type 1 diabetes mellitus with hyperlipidemia (HCC) 12/02/2021    Priority: 1.   Cannabis use disorder 01/18/2024   Anxiety 05/06/2023   Chronic pain syndrome 03/09/2022    Resolved Hospital Problems   Diagnosis Date Noted Date Resolved   Intractable nausea and vomiting 09/07/2022 06/29/2024    Priority: 1.     Discharge Instructions     Diet Carb Modified   Complete by: As directed    Increase activity slowly   Complete by: As directed       Allergies as of 06/29/2024       Reactions   Mirapex [pramipexole] Shortness Of Breath        Medication List     STOP taking these medications    amLODipine 5 MG tablet Commonly known as: NORVASC       TAKE these medications    Dexcom G7 Sensor Misc Inject 1 Device into the skin See admin instructions. Place 1 new sensor into the skin every 10 days   dicyclomine  10 MG capsule Commonly known as: BENTYL  Take 10 mg  by mouth 4 (four) times daily.   DULoxetine  60 MG capsule Commonly known as: CYMBALTA  Take 60 mg by mouth at bedtime.   insulin  aspart 100 UNIT/ML FlexPen Commonly known as: NOVOLOG  Inject 0-9 Units into the skin 3 (three) times daily with meals. CBG < 70: Eat or drink something sweet and recheck, CBG 70 - 120: 0 units CBG 121 - 150: 1 unit CBG 151 - 200: 2 units CBG 201 - 250: 3 units CBG 251 - 300: 5 units CBG 301 - 350: 7 units CBG 351 - 400: 9 units CBG > 400: call MD.   insulin  glargine 100 UNIT/ML Solostar Pen Commonly known as:  LANTUS  Inject 12 Units into the skin daily. May substitute as needed per insurance. What changed:  how much to take when to take this additional instructions   metoCLOPramide  10 MG tablet Commonly known as: REGLAN  Take 1 tablet (10 mg total) by mouth every 6 (six) hours.   MIDOL  PO Take 1 tablet by mouth every 6 (six) hours as needed (cramping).   oxyCODONE  15 MG immediate release tablet Commonly known as: ROXICODONE  Take 15 mg by mouth 4 (four) times daily.   pantoprazole  40 MG tablet Commonly known as: Protonix  Take 1 tablet (40 mg total) by mouth daily.   polyethylene glycol 17 g packet Commonly known as: MIRALAX  / GLYCOLAX  Take 17 g by mouth daily as needed for mild constipation.   promethazine  25 MG suppository Commonly known as: PHENERGAN  Place 1 suppository (25 mg total) rectally every 6 (six) hours as needed for nausea or vomiting.        Allergies  Allergen Reactions   Mirapex [Pramipexole] Shortness Of Breath    Consultations:   Procedures:   Discharge Exam: BP 121/85 (BP Location: Right Arm)   Pulse (!) 123   Temp 98.7 F (37.1 C)   Resp 17   Ht 5' 6 (1.676 m)   Wt 73.9 kg Comment: Wt from 06/01/2024  SpO2 99%   BMI 26.30 kg/m  Physical Exam Constitutional:      Appearance: Normal appearance.  HENT:     Head: Normocephalic and atraumatic.     Mouth/Throat:     Mouth: Mucous membranes are moist.  Eyes:     Extraocular Movements: Extraocular movements intact.  Cardiovascular:     Rate and Rhythm: Normal rate and regular rhythm.  Pulmonary:     Effort: Pulmonary effort is normal. No respiratory distress.     Breath sounds: Normal breath sounds. No wheezing.  Abdominal:     General: Bowel sounds are normal. There is no distension.     Palpations: Abdomen is soft.     Tenderness: There is no abdominal tenderness.  Musculoskeletal:        General: Normal range of motion.     Cervical back: Normal range of motion and neck supple.   Skin:    General: Skin is warm and dry.  Neurological:     General: No focal deficit present.     Mental Status: She is alert.  Psychiatric:        Mood and Affect: Mood normal.      The results of significant diagnostics from this hospitalization (including imaging, microbiology, ancillary and laboratory) are listed below for reference.   Microbiology: No results found for this or any previous visit (from the past 240 hours).   Labs: BNP (last 3 results) No results for input(s): BNP in the last 8760 hours. Basic Metabolic Panel: Recent  Labs  Lab 06/28/24 1851 06/28/24 1858 06/29/24 0615  NA 139 137  136 135  K 3.5 3.3*  3.3* 3.8  CL 95* 98 96*  CO2 22  --  20*  GLUCOSE 312* 333* 370*  BUN 5* 8 9  CREATININE 0.80 0.60 0.80  CALCIUM  10.1  --  9.7  MG  --   --  1.8   Liver Function Tests: Recent Labs  Lab 06/28/24 1851 06/29/24 0615  AST 17 16  ALT 13 6  ALKPHOS 59 66  BILITOT 1.3* 0.6  PROT 7.6 7.2  ALBUMIN 4.2 4.1   Recent Labs  Lab 06/29/24 0615  LIPASE 13   No results for input(s): AMMONIA in the last 168 hours. CBC: Recent Labs  Lab 06/28/24 1851 06/28/24 1858 06/29/24 0615  WBC 6.3  --  8.8  NEUTROABS 4.2  --   --   HGB 14.1 14.6  14.6 12.7  HCT 40.0 43.0  43.0 36.7  MCV 86.8  --  89.3  PLT 311  --  248   Cardiac Enzymes: No results for input(s): CKTOTAL, CKMB, CKMBINDEX, TROPONINI in the last 168 hours. BNP: Invalid input(s): POCBNP CBG: Recent Labs  Lab 06/28/24 1822 06/29/24 0106 06/29/24 0547 06/29/24 0709 06/29/24 1125  GLUCAP 332* 257* 357* 295* 174*   D-Dimer No results for input(s): DDIMER in the last 72 hours. Hgb A1c No results for input(s): HGBA1C in the last 72 hours. Lipid Profile No results for input(s): CHOL, HDL, LDLCALC, TRIG, CHOLHDL, LDLDIRECT in the last 72 hours. Thyroid  function studies No results for input(s): TSH, T4TOTAL, T3FREE, THYROIDAB in the last 72  hours.  Invalid input(s): FREET3 Anemia work up No results for input(s): VITAMINB12, FOLATE, FERRITIN, TIBC, IRON, RETICCTPCT in the last 72 hours. Urinalysis    Component Value Date/Time   COLORURINE STRAW (A) 06/09/2024 1204   APPEARANCEUR CLEAR 06/09/2024 1204   LABSPEC 1.021 06/09/2024 1204   PHURINE 8.0 06/09/2024 1204   GLUCOSEU >=500 (A) 06/09/2024 1204   HGBUR NEGATIVE 06/09/2024 1204   BILIRUBINUR NEGATIVE 06/09/2024 1204   BILIRUBINUR negative 11/08/2021 1424   KETONESUR 5 (A) 06/09/2024 1204   PROTEINUR 100 (A) 06/09/2024 1204   UROBILINOGEN 0.2 11/08/2021 1424   UROBILINOGEN 0.2 05/03/2021 0837   NITRITE NEGATIVE 06/09/2024 1204   LEUKOCYTESUR NEGATIVE 06/09/2024 1204   Sepsis Labs Recent Labs  Lab 06/28/24 1851 06/29/24 0615  WBC 6.3 8.8   Microbiology No results found for this or any previous visit (from the past 240 hours).  Procedures/Studies: DG Abd Portable 1V Result Date: 06/28/2024 EXAM: 1 VIEW XRAY OF THE ABDOMEN 06/28/2024 11:40:00 PM COMPARISON: 04/25/2024 CLINICAL HISTORY: Intractable nausea and vomiting; Abdominal pain with vomiting FINDINGS: BOWEL: Nonobstructive bowel gas pattern. SOFT TISSUES: No opaque urinary calculi. BONES: No acute osseous abnormality. IMPRESSION: 1. No acute abdominal abnormality identified. Electronically signed by: Oneil Devonshire MD 06/28/2024 11:45 PM EST RP Workstation: [id number]   DG Chest Portable 1 View Result Date: 06/28/2024 EXAM: 1 VIEW(S) XRAY OF THE CHEST 06/28/2024 07:05:00 PM COMPARISON: 03/21/2024 CLINICAL HISTORY: sob FINDINGS: LUNGS AND PLEURA: No focal pulmonary opacity. No pulmonary edema. No pleural effusion. No pneumothorax. HEART AND MEDIASTINUM: No acute abnormality of the cardiac and mediastinal silhouettes. BONES AND SOFT TISSUES: No acute osseous abnormality. IMPRESSION: 1. No acute cardiopulmonary process. Electronically signed by: Norman Gatlin MD 06/28/2024 08:07 PM EST RP Workstation:  [id number]     Time coordinating discharge: Over 30 minutes    Alm Apo, MD  [facility] 06/29/2024, 12:58 PM

## 2024-06-29 NOTE — ED Notes (Signed)
 Pt handed off to [facility] for transfer to [facility].

## 2024-06-29 NOTE — Progress Notes (Signed)
 Discharge instructions and med list reviewed with pt who verbalized understanding.  Tele and IV removed.  Pt ride is here at the main entrance.  Pt to be Dc'd via wheelchair at this time

## 2024-07-10 ENCOUNTER — Emergency Department (HOSPITAL_BASED_OUTPATIENT_CLINIC_OR_DEPARTMENT_OTHER)
Admission: EM | Admit: 2024-07-10 | Discharge: 2024-07-10 | Disposition: A | Discharge status: Home / Self Care | Attending: Emergency Medicine | Admitting: Emergency Medicine

## 2024-07-10 ENCOUNTER — Other Ambulatory Visit: Payer: Self-pay

## 2024-07-10 DIAGNOSIS — F419 Anxiety disorder, unspecified: Secondary | ICD-10-CM | POA: Insufficient documentation

## 2024-07-10 DIAGNOSIS — F1210 Cannabis abuse, uncomplicated: Secondary | ICD-10-CM | POA: Diagnosis not present

## 2024-07-10 DIAGNOSIS — E1065 Type 1 diabetes mellitus with hyperglycemia: Secondary | ICD-10-CM | POA: Insufficient documentation

## 2024-07-10 DIAGNOSIS — E1040 Type 1 diabetes mellitus with diabetic neuropathy, unspecified: Secondary | ICD-10-CM | POA: Insufficient documentation

## 2024-07-10 DIAGNOSIS — R1013 Epigastric pain: Secondary | ICD-10-CM | POA: Diagnosis not present

## 2024-07-10 DIAGNOSIS — R112 Nausea with vomiting, unspecified: Secondary | ICD-10-CM | POA: Insufficient documentation

## 2024-07-10 DIAGNOSIS — R000 Tachycardia, unspecified: Secondary | ICD-10-CM | POA: Diagnosis not present

## 2024-07-10 DIAGNOSIS — Z794 Long term (current) use of insulin: Secondary | ICD-10-CM | POA: Insufficient documentation

## 2024-07-10 DIAGNOSIS — G8929 Other chronic pain: Secondary | ICD-10-CM | POA: Diagnosis not present

## 2024-07-10 DIAGNOSIS — R1010 Upper abdominal pain, unspecified: Secondary | ICD-10-CM | POA: Diagnosis not present

## 2024-07-10 LAB — URINE DRUG SCREEN
Amphetamines: NEGATIVE
Barbiturates: NEGATIVE
Benzodiazepines: NEGATIVE
Cocaine: NEGATIVE
Fentanyl: NEGATIVE
Methadone Scn, Ur: NEGATIVE
Opiates: NEGATIVE
Tetrahydrocannabinol: POSITIVE — AB

## 2024-07-10 LAB — CBC
HCT: 37.2 % (ref 36.0–46.0)
Hemoglobin: 13.1 g/dL (ref 12.0–15.0)
MCH: 30.8 pg (ref 26.0–34.0)
MCHC: 35.2 g/dL (ref 30.0–36.0)
MCV: 87.5 fL (ref 80.0–100.0)
Platelets: 404 K/uL — ABNORMAL HIGH (ref 150–400)
RBC: 4.25 MIL/uL (ref 3.87–5.11)
RDW: 13.8 % (ref 11.5–15.5)
WBC: 11.3 K/uL — ABNORMAL HIGH (ref 4.0–10.5)
nRBC: 0 % (ref 0.0–0.2)

## 2024-07-10 LAB — URINALYSIS, ROUTINE W REFLEX MICROSCOPIC
Bacteria, UA: NONE SEEN
Bilirubin Urine: NEGATIVE
Glucose, UA: >1000 mg/dL — AB
Ketones, ur: 40 mg/dL — AB
Leukocytes,Ua: NEGATIVE
Nitrite: NEGATIVE
Protein, ur: 100 mg/dL — AB
Specific Gravity, Urine: 1.025 (ref 1.005–1.030)
pH: 7.5 (ref 5.0–8.0)

## 2024-07-10 LAB — BASIC METABOLIC PANEL WITH GFR
Anion gap: 17 — ABNORMAL HIGH (ref 5–15)
BUN: 9 mg/dL (ref 6–20)
CO2: 28 mmol/L (ref 22–32)
Calcium: 10.6 mg/dL — ABNORMAL HIGH (ref 8.9–10.3)
Chloride: 93 mmol/L — ABNORMAL LOW (ref 98–111)
Creatinine, Ser: 0.69 mg/dL (ref 0.44–1.00)
GFR, Estimated: >60 mL/min (ref >60)
Glucose, Bld: 409 mg/dL — ABNORMAL HIGH (ref 70–99)
Potassium: 3.8 mmol/L (ref 3.5–5.1)
Sodium: 138 mmol/L (ref 135–145)

## 2024-07-10 LAB — COMPREHENSIVE METABOLIC PANEL WITH GFR
ALT: 12 U/L (ref 0–44)
AST: 15 U/L (ref 15–41)
Albumin: 4.1 g/dL (ref 3.5–5.0)
Alkaline Phosphatase: 74 U/L (ref 38–126)
Anion gap: 13 (ref 5–15)
BUN: 8 mg/dL (ref 6–20)
CO2: 28 mmol/L (ref 22–32)
Calcium: 9.4 mg/dL (ref 8.9–10.3)
Chloride: 98 mmol/L (ref 98–111)
Creatinine, Ser: 0.64 mg/dL (ref 0.44–1.00)
GFR, Estimated: >60 mL/min (ref >60)
Glucose, Bld: 265 mg/dL — ABNORMAL HIGH (ref 70–99)
Potassium: 3.3 mmol/L — ABNORMAL LOW (ref 3.5–5.1)
Sodium: 139 mmol/L (ref 135–145)
Total Bilirubin: 0.3 mg/dL (ref 0.0–1.2)
Total Protein: 6.8 g/dL (ref 6.5–8.1)

## 2024-07-10 LAB — LIPASE, BLOOD: Lipase: 13 U/L (ref 11–51)

## 2024-07-10 LAB — CBG MONITORING, ED
Glucose-Capillary: 423 mg/dL — ABNORMAL HIGH (ref 70–99)
Glucose-Capillary: 266 mg/dL — ABNORMAL HIGH (ref 70–99)
Glucose-Capillary: 364 mg/dL — ABNORMAL HIGH (ref 70–99)

## 2024-07-10 LAB — BETA-HYDROXYBUTYRIC ACID: Beta-Hydroxybutyric Acid: 1.92 mmol/L — ABNORMAL HIGH (ref 0.05–0.27)

## 2024-07-10 LAB — PREGNANCY, URINE: Preg Test, Ur: NEGATIVE

## 2024-07-10 MED ORDER — METOCLOPRAMIDE HCL 10 MG PO TABS: 10.0000 mg | ORAL_TABLET | Freq: Four times a day (QID) | ORAL | 0 refills | Status: DC | PRN

## 2024-07-10 MED ORDER — INSULIN ASPART 100 UNIT/ML IJ SOLN
10.0000 [IU] | Freq: Once | INTRAMUSCULAR | Status: AC
  Administered 2024-07-10: 10 [IU] via SUBCUTANEOUS
  Filled 2024-07-10: qty 10

## 2024-07-10 MED ORDER — SODIUM CHLORIDE 0.9 % IV BOLUS
2000.0000 mL | Freq: Once | INTRAVENOUS | Status: AC
  Administered 2024-07-10: 1000 mL via INTRAVENOUS

## 2024-07-10 MED ORDER — KETOROLAC TROMETHAMINE 30 MG/ML IJ SOLN
30.0000 mg | Freq: Once | INTRAMUSCULAR | Status: AC
  Administered 2024-07-10: 30 mg via INTRAVENOUS
  Filled 2024-07-10: qty 1

## 2024-07-10 MED ORDER — DROPERIDOL 2.5 MG/ML IJ SOLN
1.2500 mg | Freq: Once | INTRAMUSCULAR | Status: AC
  Administered 2024-07-10: 1.25 mg via INTRAVENOUS
  Filled 2024-07-10: qty 2

## 2024-07-10 MED ORDER — PROCHLORPERAZINE EDISYLATE 10 MG/2ML IJ SOLN
10.0000 mg | Freq: Once | INTRAMUSCULAR | Status: AC
  Administered 2024-07-10: 10 mg via INTRAVENOUS
  Filled 2024-07-10: qty 2

## 2024-07-10 MED ORDER — ONDANSETRON HCL 4 MG/2ML IJ SOLN
4.0000 mg | Freq: Once | INTRAMUSCULAR | Status: AC
  Administered 2024-07-10: 4 mg via INTRAVENOUS
  Filled 2024-07-10: qty 2

## 2024-07-10 MED ORDER — DIPHENHYDRAMINE HCL 50 MG/ML IJ SOLN
12.5000 mg | Freq: Once | INTRAMUSCULAR | Status: AC
  Administered 2024-07-10: 12.5 mg via INTRAVENOUS
  Filled 2024-07-10: qty 1

## 2024-07-10 NOTE — ED Notes (Signed)
 Encouraged pt to drink apple juice for PO challenge.

## 2024-07-10 NOTE — Discharge Instructions (Addendum)
 You were seen today for nausea and vomiting.  This may be related to gastroparesis.  It is very important that you keep your appointment with gastroenterology for a GI study.  Continuing to smoke marijuana will likely worsen the symptoms.  Eat small frequent meals and make sure to stay hydrated.  Continue your insulin  at home.  Continue Reglan  for nausea.

## 2024-07-10 NOTE — ED Triage Notes (Signed)
 Pt POV reporting persistent n/v since last night. Hx gastroparesis.

## 2024-07-10 NOTE — ED Notes (Signed)
Pt given apple juice and ice water.

## 2024-07-10 NOTE — ED Provider Notes (Signed)
 [place] [facility]GENCY DEPARTMENT AT [facility] Provider Note   CSN: [id number] Arrival date & time: 07/10/24  0003     Patient presents with: Emesis   [name] is a 38 y.o. female.   HPI    This is a 38 year old female with history of type 1 diabetes who presents with nausea and vomiting.  Patient states that it is her gastroparesis.  She has been seen and evaluated multiple times for same.  Reports nausea, vomiting, upper abdominal discomfort and cramping.  Reports normal bowel movements.  No fevers.  Reports she has been taking her insulin  as prescribed.  Patient states that usually when she gets this way she needs Compazine  and Dilaudid  and you need to look at my chart because that is what they give me.  Prior to Admission medications   Medication Sig Start Date End Date Taking? Authorizing Provider  Acetaminophen  (MIDOL  PO) Take 1 tablet by mouth every 6 (six) hours as needed (cramping).    [provider]  Continuous Glucose Sensor (DEXCOM G7 SENSOR) MISC Inject 1 Device into the skin See admin instructions. Place 1 new sensor into the skin every 10 days    [provider]  dicyclomine  (BENTYL ) 10 MG capsule Take 10 mg by mouth 4 (four) times daily. 06/24/24   [provider]  DULoxetine  (CYMBALTA ) 60 MG capsule Take 60 mg by mouth at bedtime. 06/28/22   [provider]  insulin  aspart (NOVOLOG ) 100 UNIT/ML FlexPen Inject 0-9 Units into the skin 3 (three) times daily with meals. CBG < 70: Eat or drink something sweet and recheck, CBG 70 - 120: 0 units CBG 121 - 150: 1 unit CBG 151 - 200: 2 units CBG 201 - 250: 3 units CBG 251 - 300: 5 units CBG 301 - 350: 7 units CBG 351 - 400: 9 units CBG > 400: call MD. 08/07/23 06/28/25  Hongalgi, Anand D, MD  insulin  glargine (LANTUS ) 100 UNIT/ML Solostar Pen Inject 12 Units into the skin daily. May substitute as needed per insurance. Patient taking differently: Inject 40 Units into  the skin 2 (two) times daily. 10/15/23 06/28/25  [place], [name], DO  metoCLOPramide  (REGLAN ) 10 MG tablet Take 1 tablet (10 mg total) by mouth every 6 (six) hours as needed for nausea. 07/10/24   Rayyan Orsborn, [name], MD  oxyCODONE  (ROXICODONE ) 15 MG immediate release tablet Take 15 mg by mouth 4 (four) times daily. 04/28/24   [provider]  pantoprazole  (PROTONIX ) 40 MG tablet Take 1 tablet (40 mg total) by mouth daily. Patient not taking: Reported on 06/28/2024 04/26/24 04/26/25  Pokhrel, Laxman, MD  polyethylene glycol (MIRALAX  / GLYCOLAX ) 17 g packet Take 17 g by mouth daily as needed for mild constipation. 04/26/24   Pokhrel, Laxman, MD  promethazine  (PHENERGAN ) 25 MG suppository Place 1 suppository (25 mg total) rectally every 6 (six) hours as needed for nausea or vomiting. 02/22/24   Danesha Kirchoff, [name], MD    Allergies: Mirapex [pramipexole]    Review of Systems  Constitutional:  Negative for fever.  Respiratory:  Negative for shortness of breath.   Cardiovascular:  Negative for chest pain.  Gastrointestinal:  Positive for abdominal pain, nausea and vomiting.  All other systems reviewed and are negative.   Updated Vital Signs BP (!) 180/94   Pulse 96   Temp 99.4 F (37.4 C) (Oral)   Resp 20   SpO2 99%   Physical Exam Vitals and nursing note reviewed.  Constitutional:  Appearance: She is well-developed. She is not toxic-appearing.  HENT:     Head: Normocephalic and atraumatic.     Mouth/Throat:     Mouth: Mucous membranes are moist.  Eyes:     Pupils: Pupils are equal, round, and reactive to light.  Cardiovascular:     Rate and Rhythm: Regular rhythm. Tachycardia present.     Heart sounds: Normal heart sounds.  Pulmonary:     Effort: Pulmonary effort is normal. No respiratory distress.     Breath sounds: No wheezing.  Abdominal:     General: Bowel sounds are normal.     Palpations: Abdomen is soft.     Tenderness: There is abdominal tenderness. There is no  guarding or rebound.     Comments: Epigastric tenderness palpation without rebound or guarding, no signs of peritonitis  Musculoskeletal:     Cervical back: Neck supple.  Skin:    General: Skin is warm and dry.  Neurological:     Mental Status: She is alert and oriented to person, place, and time.  Psychiatric:     Comments: Anxious     (all labs ordered are listed, but only abnormal results are displayed) Labs Reviewed  BASIC METABOLIC PANEL WITH GFR - Abnormal; Notable for the following components:      Result Value   Chloride 93 (*)    Glucose, Bld 409 (*)    Calcium  10.6 (*)    Anion gap 17 (*)    All other components within normal limits  CBC - Abnormal; Notable for the following components:   WBC 11.3 (*)    Platelets 404 (*)    All other components within normal limits  URINALYSIS, ROUTINE W REFLEX MICROSCOPIC - Abnormal; Notable for the following components:   Color, Urine COLORLESS (*)    Glucose, UA >1,000 (*)    Hgb urine dipstick SMALL (*)    Ketones, ur 40 (*)    Protein, ur 100 (*)    All other components within normal limits  URINE DRUG SCREEN - Abnormal; Notable for the following components:   Tetrahydrocannabinol POSITIVE (*)    All other components within normal limits  BETA-HYDROXYBUTYRIC ACID - Abnormal; Notable for the following components:   Beta-Hydroxybutyric Acid 1.92 (*)    All other components within normal limits  COMPREHENSIVE METABOLIC PANEL WITH GFR - Abnormal; Notable for the following components:   Potassium 3.3 (*)    Glucose, Bld 265 (*)    All other components within normal limits  CBG MONITORING, ED - Abnormal; Notable for the following components:   Glucose-Capillary 423 (*)    All other components within normal limits  CBG MONITORING, ED - Abnormal; Notable for the following components:   Glucose-Capillary 266 (*)    All other components within normal limits  PREGNANCY, URINE  LIPASE, BLOOD  CBG MONITORING, ED    EKG: EKG  Interpretation Date/Time:  Saturday July 10 2024 01:44:29 EST Ventricular Rate:  92 PR Interval:  116 QRS Duration:  80 QT Interval:  359 QTC Calculation: 445 R Axis:   83  Text Interpretation: Sinus rhythm Borderline short PR interval Right atrial enlargement Nonspecific T abnrm, anterolateral leads Confirmed by Bari Pfeiffer ([id number]) on 07/10/2024 3:04:10 AM  Radiology: No results found.   Procedures   Medications Ordered in the ED  ondansetron  (ZOFRAN ) injection 4 mg (4 mg Intravenous Given 07/10/24 0057)  sodium chloride  0.9 % bolus 2,000 mL (0 mLs Intravenous Stopped 07/10/24 0238)  droperidol  (INAPSINE ) 2.5  MG/ML injection 1.25 mg (1.25 mg Intravenous Given 07/10/24 0132)  insulin  aspart (novoLOG ) injection 10 Units (10 Units Subcutaneous Given 07/10/24 0131)  prochlorperazine  (COMPAZINE ) injection 10 mg (10 mg Intravenous Given 07/10/24 0320)  diphenhydrAMINE  (BENADRYL ) injection 12.5 mg (12.5 mg Intravenous Given 07/10/24 0318)  ketorolac  (TORADOL ) 30 MG/ML injection 30 mg (30 mg Intravenous Given 07/10/24 0357)    Clinical Course as of 07/10/24 0540  Sat Jul 10, 2024  0454 On evaluation when I entered the room, patient resting.  Heart rate 100.  States that she is still in significant pain and has had nausea.  Discussed with her that workup is largely reassuring.  Have repeated some lab work to ensure correction of hypochloremia and hyperglycemia.  Patient states that she does not feel that she can p.o. challenge although she has not had any notable emesis while she has been here.  She continues to ask for Dilaudid  which I have told her is not indicated. [CH]    Clinical Course User Index [CH] Desia Saban, [name], MD                                 Medical Decision Making Amount and/or Complexity of Data Reviewed Labs: ordered.  Risk Prescription drug management.   This patient presents to the ED for concern of nausea, vomiting, abdominal pain, this involves  an extensive number of treatment options, and is a complaint that carries with it a high risk of complications and morbidity.  I considered the following differential and admission for this acute, potentially life threatening condition.  The differential diagnosis includes stratus, gastroparesis, pancreatitis, cholecystitis, DKA, less likely SBO  MDM:    This is a 38 year old female who presents with abdominal pain, nausea, vomiting.  She is nontoxic.  Initially tachycardic.  No signs peritonitis on exam.  Initially she is requesting IV Dilaudid .  Discussed with her that this is not indicated in cases of gastroparesis and would likely exacerbate her symptoms.  She is very tearful about not receiving this medication.  Discussed with her that I would attempt to control her symptoms with other medications that are better for this to her disease process.  Her abdomen is benign otherwise.  Labs with leukocytosis to 11.3.  Initial glucose 409 with an anion gap of 17.  Patient was given 2 L of fluid and 10 units of insulin .  She was initially given droperidol .  She was subsequently given Compazine  and Benadryl .  She had no observed emesis while in the emergency department although on multiple rechecks endorsed ongoing symptoms.  Slightly elevated beta hydroxybutyrate although she does not appear to be in DKA.  UDS is positive for THC.  Repeat CMP LFTs.  Lipase normal.  Anion gap improved.  Patient was given fluids.  Do not feel she warrants admission at this time.  She needs to follow-up for her gastric emptying study with gastroenterology as scheduled.  Discussed supportive management at home.  (Labs, imaging, consults)  Labs: I Ordered, and personally interpreted labs.  The pertinent results include: CBC, BMP, urinalysis, UDS, lipase, repeat CMP  Imaging Studies ordered: I ordered imaging studies including none I independently visualized and interpreted imaging. I agree with the radiologist  interpretation  Additional history obtained from chart review.  External records from outside source obtained and reviewed including prior evaluations  Cardiac Monitoring: The patient was maintained on a cardiac monitor.  If on the cardiac monitor,  I personally viewed and interpreted the cardiac monitored which showed an underlying rhythm of: Sinus Reevaluation: After the interventions noted above, I reevaluated the patient and found that they have :improved  Social Determinants of Health:  lives independently  Disposition: Discharge  Co morbidities that complicate the patient evaluation  Past Medical History:  Diagnosis Date   Chronic constipation 11/08/2021   Diabetic neuropathy (HCC)    History of chicken pox    History of noncompliance with medical treatment 09/07/2022   Type 1 diabetes (HCC)    Vaginal odor 11/08/2021     Medicines Meds ordered this encounter  Medications   ondansetron  (ZOFRAN ) injection 4 mg   sodium chloride  0.9 % bolus 2,000 mL   droperidol  (INAPSINE ) 2.5 MG/ML injection 1.25 mg   insulin  aspart (novoLOG ) injection 10 Units   prochlorperazine  (COMPAZINE ) injection 10 mg   diphenhydrAMINE  (BENADRYL ) injection 12.5 mg   ketorolac  (TORADOL ) 30 MG/ML injection 30 mg   metoCLOPramide  (REGLAN ) 10 MG tablet    Sig: Take 1 tablet (10 mg total) by mouth every 6 (six) hours as needed for nausea.    Dispense:  30 tablet    Refill:  0    I have reviewed the patients home medicines and have made adjustments as needed  Problem List / ED Course: Problem List Items Addressed This Visit       Digestive   Nausea and vomiting - Primary   Other Visit Diagnoses       Other chronic pain       Relevant Medications   ketorolac  (TORADOL ) 30 MG/ML injection 30 mg (Completed)                Final diagnoses:  Nausea and vomiting, unspecified vomiting type  Other chronic pain    ED Discharge Orders          Ordered    metoCLOPramide  (REGLAN ) 10 MG  tablet  Every 6 hours PRN        07/10/24 0539               Bari [name], MD 07/10/24 [phone]

## 2024-07-17 ENCOUNTER — Emergency Department (HOSPITAL_BASED_OUTPATIENT_CLINIC_OR_DEPARTMENT_OTHER)
Admission: EM | Admit: 2024-07-17 | Discharge: 2024-07-18 | Disposition: A | Attending: Emergency Medicine | Admitting: Emergency Medicine

## 2024-07-17 ENCOUNTER — Emergency Department (HOSPITAL_BASED_OUTPATIENT_CLINIC_OR_DEPARTMENT_OTHER)

## 2024-07-17 ENCOUNTER — Encounter (HOSPITAL_BASED_OUTPATIENT_CLINIC_OR_DEPARTMENT_OTHER): Payer: Self-pay

## 2024-07-17 ENCOUNTER — Other Ambulatory Visit: Payer: Self-pay

## 2024-07-17 DIAGNOSIS — R739 Hyperglycemia, unspecified: Secondary | ICD-10-CM

## 2024-07-17 DIAGNOSIS — R1084 Generalized abdominal pain: Secondary | ICD-10-CM | POA: Diagnosis not present

## 2024-07-17 DIAGNOSIS — E1065 Type 1 diabetes mellitus with hyperglycemia: Secondary | ICD-10-CM | POA: Insufficient documentation

## 2024-07-17 DIAGNOSIS — R1116 Cannabis hyperemesis syndrome: Secondary | ICD-10-CM | POA: Insufficient documentation

## 2024-07-17 DIAGNOSIS — R1110 Vomiting, unspecified: Secondary | ICD-10-CM | POA: Diagnosis present

## 2024-07-17 DIAGNOSIS — F1210 Cannabis abuse, uncomplicated: Secondary | ICD-10-CM | POA: Diagnosis not present

## 2024-07-17 DIAGNOSIS — E860 Dehydration: Secondary | ICD-10-CM

## 2024-07-17 DIAGNOSIS — R112 Nausea with vomiting, unspecified: Secondary | ICD-10-CM

## 2024-07-17 DIAGNOSIS — Z794 Long term (current) use of insulin: Secondary | ICD-10-CM | POA: Diagnosis not present

## 2024-07-17 LAB — CBC
HCT: 39.2 % (ref 36.0–46.0)
Hemoglobin: 13.7 g/dL (ref 12.0–15.0)
MCH: 30.9 pg (ref 26.0–34.0)
MCHC: 34.9 g/dL (ref 30.0–36.0)
MCV: 88.3 fL (ref 80.0–100.0)
Platelets: 354 K/uL (ref 150–400)
RBC: 4.44 MIL/uL (ref 3.87–5.11)
RDW: 13.7 % (ref 11.5–15.5)
WBC: 9 K/uL (ref 4.0–10.5)
nRBC: 0 % (ref 0.0–0.2)

## 2024-07-17 LAB — URINALYSIS, ROUTINE W REFLEX MICROSCOPIC
Bilirubin Urine: NEGATIVE
Glucose, UA: 500 mg/dL — AB
Ketones, ur: 40 mg/dL — AB
Leukocytes,Ua: NEGATIVE
Nitrite: NEGATIVE
Protein, ur: 30 mg/dL — AB
Specific Gravity, Urine: 1.033 — ABNORMAL HIGH (ref 1.005–1.030)
pH: 8 (ref 5.0–8.0)

## 2024-07-17 LAB — I-STAT VENOUS BLOOD GAS, ED
Acid-Base Excess: 5 mmol/L — ABNORMAL HIGH (ref 0.0–2.0)
Bicarbonate: 29.6 mmol/L — ABNORMAL HIGH (ref 20.0–28.0)
Calcium, Ion: 1.21 mmol/L (ref 1.15–1.40)
HCT: 37 % (ref 36.0–46.0)
Hemoglobin: 12.6 g/dL (ref 12.0–15.0)
O2 Saturation: 72 %
Patient temperature: 98.4
Potassium: 3.6 mmol/L (ref 3.5–5.1)
Sodium: 136 mmol/L (ref 135–145)
TCO2: 31 mmol/L (ref 22–32)
pCO2, Ven: 42.7 mmHg — ABNORMAL LOW (ref 44–60)
pH, Ven: 7.449 — ABNORMAL HIGH (ref 7.25–7.43)
pO2, Ven: 36 mmHg (ref 32–45)

## 2024-07-17 LAB — COMPREHENSIVE METABOLIC PANEL WITH GFR
ALT: 8 U/L (ref 0–44)
AST: 19 U/L (ref 15–41)
Albumin: 4.9 g/dL (ref 3.5–5.0)
Alkaline Phosphatase: 92 U/L (ref 38–126)
Anion gap: 16 — ABNORMAL HIGH (ref 5–15)
BUN: 12 mg/dL (ref 6–20)
CO2: 27 mmol/L (ref 22–32)
Calcium: 10.8 mg/dL — ABNORMAL HIGH (ref 8.9–10.3)
Chloride: 97 mmol/L — ABNORMAL LOW (ref 98–111)
Creatinine, Ser: 0.66 mg/dL (ref 0.44–1.00)
GFR, Estimated: >60 mL/min (ref >60)
Glucose, Bld: 218 mg/dL — ABNORMAL HIGH (ref 70–99)
Potassium: 3.7 mmol/L (ref 3.5–5.1)
Sodium: 140 mmol/L (ref 135–145)
Total Bilirubin: 0.4 mg/dL (ref 0.0–1.2)
Total Protein: 8.4 g/dL — ABNORMAL HIGH (ref 6.5–8.1)

## 2024-07-17 LAB — URINE DRUG SCREEN
Amphetamines: NEGATIVE
Barbiturates: NEGATIVE
Benzodiazepines: NEGATIVE
Cocaine: NEGATIVE
Fentanyl: NEGATIVE
Methadone Scn, Ur: NEGATIVE
Opiates: NEGATIVE
Tetrahydrocannabinol: POSITIVE — AB

## 2024-07-17 LAB — BETA-HYDROXYBUTYRIC ACID: Beta-Hydroxybutyric Acid: 1.67 mmol/L — ABNORMAL HIGH (ref 0.05–0.27)

## 2024-07-17 LAB — LIPASE, BLOOD: Lipase: 146 U/L — ABNORMAL HIGH (ref 11–51)

## 2024-07-17 LAB — PREGNANCY, URINE: Preg Test, Ur: NEGATIVE

## 2024-07-17 LAB — CBG MONITORING, ED: Glucose-Capillary: 196 mg/dL — ABNORMAL HIGH (ref 70–99)

## 2024-07-17 MED ORDER — DIPHENHYDRAMINE HCL 25 MG PO CAPS
25.0000 mg | ORAL_CAPSULE | Freq: Once | ORAL | Status: AC
  Administered 2024-07-17: 25 mg via ORAL
  Filled 2024-07-17: qty 1

## 2024-07-17 MED ORDER — IOHEXOL 300 MG/ML  SOLN
100.0000 mL | Freq: Once | INTRAMUSCULAR | Status: AC | PRN
  Administered 2024-07-17: 100 mL via INTRAVENOUS

## 2024-07-17 MED ORDER — PROCHLORPERAZINE MALEATE 10 MG PO TABS
10.0000 mg | ORAL_TABLET | Freq: Once | ORAL | Status: AC
  Administered 2024-07-17: 10 mg via ORAL
  Filled 2024-07-17: qty 1

## 2024-07-17 MED ORDER — SODIUM CHLORIDE 0.9 % IV BOLUS
1000.0000 mL | Freq: Once | INTRAVENOUS | Status: AC
  Administered 2024-07-17: 1000 mL via INTRAVENOUS

## 2024-07-17 MED ORDER — INSULIN REGULAR(HUMAN) IN NACL 100-0.9 UT/100ML-% IV SOLN
INTRAVENOUS | Status: DC
  Administered 2024-07-18: 7 [IU]/h via INTRAVENOUS
  Filled 2024-07-17: qty 100

## 2024-07-17 MED ORDER — KETOROLAC TROMETHAMINE 15 MG/ML IJ SOLN
15.0000 mg | Freq: Once | INTRAMUSCULAR | Status: AC
Start: 2024-07-17 — End: 2024-07-17
  Administered 2024-07-17: 15 mg via INTRAVENOUS
  Filled 2024-07-17: qty 1

## 2024-07-17 MED ORDER — DEXTROSE 50 % IV SOLN: 0.0000 mL | INTRAVENOUS | Status: DC | PRN

## 2024-07-17 MED ORDER — LACTATED RINGERS IV SOLN: INTRAVENOUS | Status: DC

## 2024-07-17 MED ORDER — DEXTROSE IN LACTATED RINGERS 5 % IV SOLN: INTRAVENOUS | Status: DC

## 2024-07-17 MED ORDER — DROPERIDOL 2.5 MG/ML IJ SOLN
1.2500 mg | Freq: Once | INTRAMUSCULAR | Status: AC
  Administered 2024-07-17: 1.25 mg via INTRAVENOUS
  Filled 2024-07-17: qty 2

## 2024-07-17 NOTE — ED Triage Notes (Signed)
 Pt POV reporting persistent n/v, hx gastroparesis.

## 2024-07-17 NOTE — ED Notes (Signed)
 VBG drawn on room air. Results hand delivered to MDP

## 2024-07-17 NOTE — ED Provider Notes (Signed)
 [place] EMERGENCY DEPARTMENT AT [facility] Provider Note   CSN: [id number] Arrival date & time: 07/17/24  8162     Patient presents with: Emesis   [name] is a 38 y.o. female with past medical history of poorly managed diabetes, GERD, chronic pain syndrome, intractable cyclical vomiting and nausea, cannabis use disorder, frequent emergency department visits who presents to the emergency department today for evaluation of vomiting.  Patient reports vomiting began earlier today and she is very insistent that she be given Compazine , Benadryl  and Dilaudid .  Patient repeatedly states this is the only thing that works for her.  She reports multiple episodes of vomiting today.  She denies any hematemesis.  She denies any chest pain or shortness of breath.  Patient is frequently evaluated in the emergency department for similar complaints.    Emesis Associated symptoms: abdominal pain        Prior to Admission medications   Medication Sig Start Date End Date Taking? Authorizing Provider  Acetaminophen  (MIDOL  PO) Take 1 tablet by mouth every 6 (six) hours as needed (cramping).    [provider]  Continuous Glucose Sensor (DEXCOM G7 SENSOR) MISC Inject 1 Device into the skin See admin instructions. Place 1 new sensor into the skin every 10 days    [provider]  dicyclomine  (BENTYL ) 10 MG capsule Take 10 mg by mouth 4 (four) times daily. 06/24/24   [provider]  DULoxetine  (CYMBALTA ) 60 MG capsule Take 60 mg by mouth at bedtime. 06/28/22   [provider]  insulin  aspart (NOVOLOG ) 100 UNIT/ML FlexPen Inject 0-9 Units into the skin 3 (three) times daily with meals. CBG < 70: Eat or drink something sweet and recheck, CBG 70 - 120: 0 units CBG 121 - 150: 1 unit CBG 151 - 200: 2 units CBG 201 - 250: 3 units CBG 251 - 300: 5 units CBG 301 - 350: 7 units CBG 351 - 400: 9 units CBG > 400: call MD. 08/07/23 06/28/25  Hongalgi, Anand D,  MD  insulin  glargine (LANTUS ) 100 UNIT/ML Solostar Pen Inject 12 Units into the skin daily. May substitute as needed per insurance. Patient taking differently: Inject 40 Units into the skin 2 (two) times daily. 10/15/23 06/28/25  [place], [name], DO  metoCLOPramide  (REGLAN ) 10 MG tablet Take 1 tablet (10 mg total) by mouth every 6 (six) hours as needed for nausea. 07/10/24   Horton, [name], MD  oxyCODONE  (ROXICODONE ) 15 MG immediate release tablet Take 15 mg by mouth 4 (four) times daily. 04/28/24   [provider]  pantoprazole  (PROTONIX ) 40 MG tablet Take 1 tablet (40 mg total) by mouth daily. Patient not taking: Reported on 06/28/2024 04/26/24 04/26/25  Pokhrel, Laxman, MD  polyethylene glycol (MIRALAX  / GLYCOLAX ) 17 g packet Take 17 g by mouth daily as needed for mild constipation. 04/26/24   Pokhrel, Vernal, MD  promethazine  (PHENERGAN ) 25 MG suppository Place 1 suppository (25 mg total) rectally every 6 (six) hours as needed for nausea or vomiting. 02/22/24   Horton, [name], MD    Allergies: Mirapex [pramipexole]    Review of Systems  Gastrointestinal:  Positive for abdominal pain and vomiting.    Updated Vital Signs BP (!) 179/111   Pulse (!) 110   Temp 98.4 F (36.9 C) (Oral)   Resp 20   Ht 5' 6 (1.676 m)   Wt 74 kg   SpO2 100%   BMI 26.33 kg/m   Physical Exam Vitals  and nursing note reviewed.  Constitutional:      Appearance: Normal appearance.  HENT:     Head: Normocephalic and atraumatic.     Mouth/Throat:     Mouth: Mucous membranes are moist.  Eyes:     General: No scleral icterus.       Right eye: No discharge.        Left eye: No discharge.     Conjunctiva/sclera: Conjunctivae normal.  Neck:     Comments: Patient with generalized abdominal tenderness to palpation. Cardiovascular:     Rate and Rhythm: Normal rate and regular rhythm.     Pulses: Normal pulses.  Pulmonary:     Effort: Pulmonary effort is normal.     Breath sounds: Normal breath  sounds.  Abdominal:     General: There is no distension.     Tenderness: There is abdominal tenderness.  Musculoskeletal:        General: No deformity.     Cervical back: Normal range of motion.  Skin:    General: Skin is warm and dry.     Capillary Refill: Capillary refill takes less than 2 seconds.  Neurological:     Mental Status: She is alert.     Motor: No weakness.  Psychiatric:        Mood and Affect: Mood normal.     (all labs ordered are listed, but only abnormal results are displayed) Labs Reviewed  LIPASE, BLOOD - Abnormal; Notable for the following components:      Result Value   Lipase 146 (*)    All other components within normal limits  COMPREHENSIVE METABOLIC PANEL WITH GFR - Abnormal; Notable for the following components:   Chloride 97 (*)    Glucose, Bld 218 (*)    Calcium  10.8 (*)    Total Protein 8.4 (*)    Anion gap 16 (*)    All other components within normal limits  BETA-HYDROXYBUTYRIC ACID - Abnormal; Notable for the following components:   Beta-Hydroxybutyric Acid 1.67 (*)    All other components within normal limits  CBG MONITORING, ED - Abnormal; Notable for the following components:   Glucose-Capillary 196 (*)    All other components within normal limits  I-STAT VENOUS BLOOD GAS, ED - Abnormal; Notable for the following components:   pH, Ven 7.449 (*)    pCO2, Ven 42.7 (*)    Bicarbonate 29.6 (*)    Acid-Base Excess 5.0 (*)    All other components within normal limits  CBC  URINALYSIS, ROUTINE W REFLEX MICROSCOPIC  PREGNANCY, URINE  URINE DRUG SCREEN    EKG: None  Radiology: CT ABDOMEN PELVIS W CONTRAST Result Date: 07/17/2024 CLINICAL DATA:  Pancreatitis suspected. EXAM: CT ABDOMEN AND PELVIS WITH CONTRAST TECHNIQUE: Multidetector CT imaging of the abdomen and pelvis was performed using the standard protocol following bolus administration of intravenous contrast. RADIATION DOSE REDUCTION: This exam was performed according to the  departmental dose-optimization program which includes automated exposure control, adjustment of the mA and/or kV according to patient size and/or use of iterative reconstruction technique. CONTRAST:  OMNIPAQUE  IOHEXOL  300 MG/ML  SOLN COMPARISON:  CT abdomen and pelvis 03/27/2024 FINDINGS: Lower chest: No acute abnormality. Hepatobiliary: Gallstones are present. There is no biliary ductal dilatation. Liver and bile ducts are within normal limits. Pancreas: Unremarkable. No pancreatic ductal dilatation or surrounding inflammatory changes. Spleen: Normal in size without focal abnormality. Adrenals/Urinary Tract: Adrenal glands are unremarkable. Kidneys are normal, without renal calculi, focal lesion, or hydronephrosis.  Bladder is unremarkable. Stomach/Bowel: Stomach is within normal limits. No evidence of bowel wall thickening, distention, or inflammatory changes. The appendix is not visualized. There is a large amount of stool throughout the colon. There is wall thickening of the distal esophagus similar to prior. Vascular/Lymphatic: Aortic atherosclerosis. No enlarged abdominal or pelvic lymph nodes. Reproductive: Uterus is heterogeneous and lobulated likely related to fibroid disease. There is likely a dominant follicle or small cyst in the right ovary measuring 2.1 cm. Left ovary is grossly within normal limits. Other: There is no ascites or abdominal wall hernia. There is skin thickening and subcutaneous stranding of the mid right anterior abdominal wall. No fluid collection or soft tissue gas. Musculoskeletal: No acute or significant osseous findings. IMPRESSION: 1. No CT evidence for acute pancreatitis. 2. Cholelithiasis. 3. Large amount of stool throughout the colon. 4. Fibroid uterus. 5. Skin thickening and subcutaneous stranding in the mid right anterior abdominal wall. Correlate clinically for cellulitis. 6. Wall thickening of the distal esophagus similar to prior. Correlate clinically for esophagitis.  7. Aortic atherosclerosis. Aortic Atherosclerosis (ICD10-I70.0). Electronically Signed   By: Greig Pique M.D.   On: 07/17/2024 20:48     Procedures   Medications Ordered in the ED  sodium chloride  0.9 % bolus 1,000 mL (0 mLs Intravenous Stopped 07/17/24 2115)  droperidol  (INAPSINE ) 2.5 MG/ML injection 1.25 mg (1.25 mg Intravenous Given 07/17/24 2003)  iohexol  (OMNIPAQUE ) 300 MG/ML solution 100 mL (100 mLs Intravenous Contrast Given 07/17/24 2031)                                   Medical Decision Making Amount and/or Complexity of Data Reviewed Labs: ordered. Radiology: ordered.  Risk Prescription drug management.   This patient presents to the ED for concern of ***, this involves an extensive number of treatment options, and is a complaint that carries with it a high risk of complications and morbidity.  *** Differential diagnosis includes: ***  Co morbidities: ***  Social Determinants of Health:  ***  Additional history:  {Additional history obtained from ***} {External records from outside source obtained and reviewed including} {Chart review for pertinents}  Lab Tests:  I Ordered, and personally interpreted labs.  The pertinent results include:    -Lipase: 146 - Initial glucose: 218 - Beta hydroxybutyrate acid: 1.67 - UDS: Positive for THC  Imaging Studies:  I ordered imaging studies including CT abdomen and pelvis I independently visualized and interpreted imaging which showed   1. No CT evidence for acute pancreatitis.  2. Cholelithiasis.  3. Large amount of stool throughout the colon.  4. Fibroid uterus.  5. Skin thickening and subcutaneous stranding in the mid right  anterior abdominal wall. Correlate clinically for cellulitis.  6. Wall thickening of the distal esophagus similar to prior.  Correlate clinically for esophagitis.  7. Aortic atherosclerosis.   I agree with the radiologist interpretation  Cardiac Monitoring/ECG:  The patient was  maintained on a cardiac monitor.  I personally viewed and interpreted the cardiac monitored which showed an underlying rhythm of: Sinus tachycardia  Medicines ordered and prescription drug management:  I ordered medication including  Medications  sodium chloride  0.9 % bolus 1,000 mL (0 mLs Intravenous Stopped 07/17/24 2115)  droperidol  (INAPSINE ) 2.5 MG/ML injection 1.25 mg (1.25 mg Intravenous Given 07/17/24 2003)  iohexol  (OMNIPAQUE ) 300 MG/ML solution 100 mL (100 mLs Intravenous Contrast Given 07/17/24 2031)  prochlorperazine  (COMPAZINE ) tablet 10 mg (  10 mg Oral Given 07/17/24 2228)  diphenhydrAMINE  (BENADRYL ) capsule 25 mg (25 mg Oral Given 07/17/24 2228)  ketorolac  (TORADOL ) 15 MG/ML injection 15 mg (15 mg Intravenous Given 07/17/24 2229)   for vomiting and pain control Reevaluation of the patient after these medicines showed that the patient improved I have reviewed the patients home medicines and have made adjustments as needed  Test Considered:   none  Critical Interventions:   none  Consultations Obtained: ***  Problem List / ED Course:  No diagnosis found.  MDM: 38 year old female who presents emergency department for evaluation of vomiting.  Patient is frequently evaluated in the emergency department for the same complaint.  On initial assessment, patient is asking for Compazine , Benadryl  and Dilaudid .  I informed her that this is not necessarily the appropriate treatment for her condition and symptoms.  Patient continued to argue this.  I did verbalized to patient that I would treat her pain and nausea, but it would not be with these medications.  Her lab work shows an initial glucose of 218, lipase of 146, beta hydroxybutyric acid 1.67 and pH of 7.449.  At this time, I do not believe patient meets criteria for DKA or HHS.  However, I did give her a liter of fluids.  Her UDS is positive for THC.  Upon reassessment and after initial fluid bolus, I did initiate ED  hyperglycemic order set.  Patient's repeat blood glucose shows***.  I did order a CT of her abdomen and pelvis given her generalized abdominal tenderness and elevated lipase.  CT showed no evidence of pancreatitis.  There is some evidence of cholelithiasis.   Dispostion:  After consideration of the diagnostic results and the patients response to treatment, I feel that the patient would benefit from ***.  {Document critical care time when appropriate  Document review of labs and clinical decision tools ie [id number], etc  Document your independent review of radiology images and any outside records  Document your discussion with family members, caretakers and with consultants  Document social determinants of health affecting pt's care  Document your decision making why or why not admission, treatments were needed:[id number]:::1}   Final diagnoses:  None    ED Discharge Orders     None

## 2024-07-18 LAB — CBG MONITORING, ED: Glucose-Capillary: 236 mg/dL — ABNORMAL HIGH (ref 70–99)

## 2024-07-28 ENCOUNTER — Ambulatory Visit: Admitting: Gastroenterology

## 2024-07-28 ENCOUNTER — Encounter: Payer: Self-pay | Admitting: Gastroenterology

## 2024-07-28 ENCOUNTER — Emergency Department (HOSPITAL_BASED_OUTPATIENT_CLINIC_OR_DEPARTMENT_OTHER)
Admission: EM | Admit: 2024-07-28 | Discharge: 2024-07-28 | Disposition: A | Discharge status: Home / Self Care | Attending: Emergency Medicine | Admitting: Emergency Medicine

## 2024-07-28 ENCOUNTER — Ambulatory Visit: Payer: Self-pay | Admitting: Gastroenterology

## 2024-07-28 ENCOUNTER — Other Ambulatory Visit (INDEPENDENT_AMBULATORY_CARE_PROVIDER_SITE_OTHER)

## 2024-07-28 ENCOUNTER — Encounter (HOSPITAL_BASED_OUTPATIENT_CLINIC_OR_DEPARTMENT_OTHER): Payer: Self-pay | Admitting: Emergency Medicine

## 2024-07-28 DIAGNOSIS — R1084 Generalized abdominal pain: Secondary | ICD-10-CM | POA: Diagnosis not present

## 2024-07-28 DIAGNOSIS — K5903 Drug induced constipation: Secondary | ICD-10-CM

## 2024-07-28 DIAGNOSIS — R1110 Vomiting, unspecified: Secondary | ICD-10-CM | POA: Diagnosis present

## 2024-07-28 DIAGNOSIS — E1065 Type 1 diabetes mellitus with hyperglycemia: Secondary | ICD-10-CM | POA: Diagnosis not present

## 2024-07-28 DIAGNOSIS — R933 Abnormal findings on diagnostic imaging of other parts of digestive tract: Secondary | ICD-10-CM | POA: Diagnosis not present

## 2024-07-28 DIAGNOSIS — R6881 Early satiety: Secondary | ICD-10-CM | POA: Diagnosis not present

## 2024-07-28 DIAGNOSIS — R739 Hyperglycemia, unspecified: Secondary | ICD-10-CM | POA: Insufficient documentation

## 2024-07-28 DIAGNOSIS — R634 Abnormal weight loss: Secondary | ICD-10-CM | POA: Diagnosis not present

## 2024-07-28 DIAGNOSIS — R1115 Cyclical vomiting syndrome unrelated to migraine: Secondary | ICD-10-CM | POA: Insufficient documentation

## 2024-07-28 DIAGNOSIS — R112 Nausea with vomiting, unspecified: Secondary | ICD-10-CM | POA: Diagnosis not present

## 2024-07-28 DIAGNOSIS — G894 Chronic pain syndrome: Secondary | ICD-10-CM | POA: Diagnosis not present

## 2024-07-28 DIAGNOSIS — R1013 Epigastric pain: Secondary | ICD-10-CM

## 2024-07-28 DIAGNOSIS — K2100 Gastro-esophageal reflux disease with esophagitis, without bleeding: Secondary | ICD-10-CM | POA: Diagnosis not present

## 2024-07-28 DIAGNOSIS — R630 Anorexia: Secondary | ICD-10-CM | POA: Diagnosis not present

## 2024-07-28 DIAGNOSIS — T402X5A Adverse effect of other opioids, initial encounter: Secondary | ICD-10-CM

## 2024-07-28 LAB — CBC WITH DIFFERENTIAL/PLATELET
Abs Immature Granulocytes: 0.03 K/uL (ref 0.00–0.07)
Basophils Absolute: 0.1 K/uL (ref 0.0–0.1)
Basophils Absolute: 0 K/uL (ref 0.0–0.1)
Basophils Relative: 1 % (ref 0.0–3.0)
Basophils Relative: 0 %
Eosinophils Absolute: 0.1 K/uL (ref 0.0–0.7)
Eosinophils Absolute: 0 K/uL (ref 0.0–0.5)
Eosinophils Relative: 1.3 % (ref 0.0–5.0)
Eosinophils Relative: 0 %
HCT: 41.2 % (ref 36.0–46.0)
HCT: 36.9 % (ref 36.0–46.0)
Hemoglobin: 13.8 g/dL (ref 12.0–15.0)
Hemoglobin: 12.8 g/dL (ref 12.0–15.0)
Immature Granulocytes: 0 %
Lymphocytes Relative: 23.6 % (ref 12.0–46.0)
Lymphocytes Relative: 11 %
Lymphs Abs: 2.1 K/uL (ref 0.7–4.0)
Lymphs Abs: 1.2 K/uL (ref 0.7–4.0)
MCH: 31.1 pg (ref 26.0–34.0)
MCHC: 33.5 g/dL (ref 30.0–36.0)
MCHC: 34.7 g/dL (ref 30.0–36.0)
MCV: 91.9 fl (ref 78.0–100.0)
MCV: 89.6 fL (ref 80.0–100.0)
Monocytes Absolute: 0.4 K/uL (ref 0.1–1.0)
Monocytes Absolute: 0.3 K/uL (ref 0.1–1.0)
Monocytes Relative: 4.5 % (ref 3.0–12.0)
Monocytes Relative: 3 %
Neutro Abs: 6.2 K/uL (ref 1.4–7.7)
Neutro Abs: 8.9 K/uL — ABNORMAL HIGH (ref 1.7–7.7)
Neutrophils Relative %: 69.6 % (ref 43.0–77.0)
Neutrophils Relative %: 86 %
Platelets: 393 K/uL (ref 150.0–400.0)
Platelets: 361 K/uL (ref 150–400)
RBC: 4.48 Mil/uL (ref 3.87–5.11)
RBC: 4.12 MIL/uL (ref 3.87–5.11)
RDW: 15.5 % (ref 11.5–15.5)
RDW: 14.5 % (ref 11.5–15.5)
WBC: 9 K/uL (ref 4.0–10.5)
WBC: 10.5 K/uL (ref 4.0–10.5)
nRBC: 0 % (ref 0.0–0.2)

## 2024-07-28 LAB — URINALYSIS, ROUTINE W REFLEX MICROSCOPIC
Bilirubin Urine: NEGATIVE
Glucose, UA: >1000 mg/dL — AB
Ketones, ur: >80 mg/dL — AB
Nitrite: NEGATIVE
Protein, ur: 100 mg/dL — AB
Specific Gravity, Urine: 1.019 (ref 1.005–1.030)
pH: 7.5 (ref 5.0–8.0)

## 2024-07-28 LAB — COMPREHENSIVE METABOLIC PANEL WITH GFR
ALT: 8 U/L (ref 0–35)
AST: 13 U/L (ref 0–37)
Albumin: 4.2 g/dL (ref 3.5–5.2)
Alkaline Phosphatase: 77 U/L (ref 39–117)
BUN: 6 mg/dL (ref 6–23)
CO2: 29 meq/L (ref 19–32)
Calcium: 10.2 mg/dL (ref 8.4–10.5)
Chloride: 100 meq/L (ref 96–112)
Creatinine, Ser: 0.69 mg/dL (ref 0.40–1.20)
GFR: 110.52 mL/min (ref >60.00)
Glucose, Bld: 113 mg/dL — ABNORMAL HIGH (ref 70–99)
Potassium: 4 meq/L (ref 3.5–5.1)
Sodium: 139 meq/L (ref 135–145)
Total Bilirubin: 0.5 mg/dL (ref 0.2–1.2)
Total Protein: 7.6 g/dL (ref 6.0–8.3)

## 2024-07-28 LAB — ETHANOL: Alcohol, Ethyl (B): <15 mg/dL (ref <15)

## 2024-07-28 LAB — I-STAT VENOUS BLOOD GAS, ED
Acid-Base Excess: 4 mmol/L — ABNORMAL HIGH (ref 0.0–2.0)
Bicarbonate: 25.3 mmol/L (ref 20.0–28.0)
Calcium, Ion: 1.04 mmol/L — ABNORMAL LOW (ref 1.15–1.40)
HCT: 39 % (ref 36.0–46.0)
Hemoglobin: 13.3 g/dL (ref 12.0–15.0)
O2 Saturation: 85 %
Patient temperature: 98.1
Potassium: 4.2 mmol/L (ref 3.5–5.1)
Sodium: 136 mmol/L (ref 135–145)
TCO2: 26 mmol/L (ref 22–32)
pCO2, Ven: 28.2 mmHg — ABNORMAL LOW (ref 44–60)
pH, Ven: 7.561 — ABNORMAL HIGH (ref 7.25–7.43)
pO2, Ven: 42 mmHg (ref 32–45)

## 2024-07-28 LAB — LIPASE: Lipase: 32 U/L (ref 11.0–59.0)

## 2024-07-28 LAB — BASIC METABOLIC PANEL WITH GFR
Anion gap: 22 — ABNORMAL HIGH (ref 5–15)
Anion gap: 18 — ABNORMAL HIGH (ref 5–15)
BUN: 7 mg/dL (ref 6–20)
BUN: 7 mg/dL (ref 6–20)
CO2: 21 mmol/L — ABNORMAL LOW (ref 22–32)
CO2: 23 mmol/L (ref 22–32)
Calcium: 10.4 mg/dL — ABNORMAL HIGH (ref 8.9–10.3)
Calcium: 9.5 mg/dL (ref 8.9–10.3)
Chloride: 96 mmol/L — ABNORMAL LOW (ref 98–111)
Chloride: 98 mmol/L (ref 98–111)
Creatinine, Ser: 0.76 mg/dL (ref 0.44–1.00)
Creatinine, Ser: 0.7 mg/dL (ref 0.44–1.00)
GFR, Estimated: >60 mL/min (ref >60)
GFR, Estimated: >60 mL/min (ref >60)
Glucose, Bld: 305 mg/dL — ABNORMAL HIGH (ref 70–99)
Glucose, Bld: 272 mg/dL — ABNORMAL HIGH (ref 70–99)
Potassium: 4.4 mmol/L (ref 3.5–5.1)
Potassium: 4.3 mmol/L (ref 3.5–5.1)
Sodium: 138 mmol/L (ref 135–145)
Sodium: 139 mmol/L (ref 135–145)

## 2024-07-28 LAB — PREGNANCY, URINE: Preg Test, Ur: NEGATIVE

## 2024-07-28 LAB — HEMOGLOBIN A1C: Hgb A1c MFr Bld: 9.5 % — ABNORMAL HIGH (ref 4.6–6.5)

## 2024-07-28 LAB — HEPATIC FUNCTION PANEL
ALT: 8 U/L (ref 0–44)
AST: 27 U/L (ref 15–41)
Albumin: 4.2 g/dL (ref 3.5–5.0)
Alkaline Phosphatase: 85 U/L (ref 38–126)
Bilirubin, Direct: 0.1 mg/dL (ref 0.0–0.2)
Indirect Bilirubin: 0.5 mg/dL (ref 0.3–0.9)
Total Bilirubin: 0.6 mg/dL (ref 0.0–1.2)
Total Protein: 7.8 g/dL (ref 6.5–8.1)

## 2024-07-28 LAB — CBG MONITORING, ED: Glucose-Capillary: 274 mg/dL — ABNORMAL HIGH (ref 70–99)

## 2024-07-28 LAB — LIPASE, BLOOD: Lipase: 13 U/L (ref 11–51)

## 2024-07-28 LAB — BETA-HYDROXYBUTYRIC ACID: Beta-Hydroxybutyric Acid: 1.33 mmol/L — ABNORMAL HIGH (ref 0.05–0.27)

## 2024-07-28 MED ORDER — DIPHENHYDRAMINE HCL 50 MG/ML IJ SOLN
25.0000 mg | Freq: Once | INTRAMUSCULAR | Status: AC
  Administered 2024-07-28: 25 mg via INTRAVENOUS
  Filled 2024-07-28: qty 1

## 2024-07-28 MED ORDER — METOCLOPRAMIDE HCL 5 MG/ML IJ SOLN
10.0000 mg | Freq: Once | INTRAMUSCULAR | Status: AC
  Administered 2024-07-28: 10 mg via INTRAVENOUS
  Filled 2024-07-28: qty 2

## 2024-07-28 MED ORDER — PRUCALOPRIDE SUCCINATE 2 MG PO TABS: 2.0000 mg | ORAL_TABLET | Freq: Every day | ORAL | 0 refills | Status: AC

## 2024-07-28 MED ORDER — PANTOPRAZOLE SODIUM 40 MG PO TBEC: 40.0000 mg | DELAYED_RELEASE_TABLET | Freq: Every day | ORAL | 2 refills | Status: DC

## 2024-07-28 MED ORDER — OXYCODONE HCL 5 MG PO TABS
15.0000 mg | ORAL_TABLET | Freq: Once | ORAL | Status: AC
  Administered 2024-07-28: 15 mg via ORAL
  Filled 2024-07-28: qty 3

## 2024-07-28 MED ORDER — SODIUM CHLORIDE 0.9 % IV BOLUS
1000.0000 mL | Freq: Once | INTRAVENOUS | Status: AC
  Administered 2024-07-28: 1000 mL via INTRAVENOUS

## 2024-07-28 MED ORDER — HYOSCYAMINE SULFATE 0.125 MG SL SUBL: 0.1250 mg | SUBLINGUAL_TABLET | Freq: Three times a day (TID) | SUBLINGUAL | 0 refills | Status: DC | PRN

## 2024-07-28 MED ORDER — PROCHLORPERAZINE MALEATE 5 MG PO TABS: 5.0000 mg | ORAL_TABLET | Freq: Two times a day (BID) | ORAL | 0 refills | Status: DC | PRN

## 2024-07-28 NOTE — ED Triage Notes (Signed)
 Pt pulled from vehicle at ED as a loc, concern for DKA. Pt speaking in triage, stating it hurts. Pt speaking and crying. Hx of gastroparesis

## 2024-07-28 NOTE — Progress Notes (Addendum)
 Chief Complaint:abdominal pain, nausea and vomiting, constipation  Primary GI Doctor: Dr. Federico  HPI:  Patient is a  38  year old female patient with past medical history of poorly managed diabetes, GERD, chronic pain syndrome, intractable cyclical vomiting and nausea, cannabis use disorder, constipation, and neuropathy who was self referred to me for a evaluation of abd pain, nausea, vomiting, and constipation .    07/17/24 Patient presented for her 16th ER visit in 6 mths, typically presenting with hyperglycemia, vomiting, and abd pain.  CT scan abdomen pelvis was unremarkable Though her beta hydroxybutyric acid was elevated, she had a normal bicarb and a minimally elevated anion gap.  Her glucose is currently under 300. Lipase: 146.  She has received IV fluids. Positive for THC.   Interval History Patient last seen in GI office on 11/26/21 by Dr. Federico constipation.   Patient bent over today during appointment day, swaying back and forth.   Patient reports daily uncontrollable nausea and vomiting. She notes throat irritation from dry heaving, notes blood mixed with her sputum.  Patient taking Reglan  every 6 hours which she reports does not help. Patient also uses promethazine  suppositories prn which does not help. She has lost 12lbs in past few weeks due to current symptoms. She reports she tries to intake oral hydration, but not eating much.  Patient denies history of GERD, not currently on any reflux medication. Denies dysphagia. Reports her sugars are controlled.   She reports flares of severe epigastric abdominal pain.  She reports the only thing that helps is IV dilaudid  when she goes to ER. She takes oxycodone  15 mg QID for chronic neuropathic pain, reports this does not help with the abdominal pain.She reports the dicyclomine  prescribed does not help with the abdominal pain.  No history of pancreatitis.   She reports she will go sometimes go up to 2 weeks without a bowel  movement. She uses OTC laxatives and Linzess  290 mcg po daily without any relief.   Socially drinks, maybe once every 1-2 mths. Denies tobacco or mariajuana use.  She denies NSAID use.   Surgical history:none  Patient's family history includes: no esophageal CA, no colon CA  Wt Readings from Last 3 Encounters:  07/28/24 151 lb (68.5 kg)  07/17/24 163 lb 2.3 oz (74 kg)  06/28/24 162 lb 14.7 oz (73.9 kg)    Past Medical History:  Diagnosis Date   Chronic constipation 11/08/2021   Diabetic neuropathy (HCC)    History of chicken pox    History of noncompliance with medical treatment 09/07/2022   Type 1 diabetes (HCC)    Vaginal odor 11/08/2021    Past Surgical History:  Procedure Laterality Date   CESAREAN SECTION     only one C/S per pt.   CO2 LASER APPLICATION N/A 02/22/2022   Procedure: CO2 LASER APPLICATION TO VULVA;  Surgeon: Viktoria Comer SAUNDERS, MD;  Location: [facility] [place];  Service: General;  Laterality: N/A;   DILATION AND CURETTAGE OF UTERUS     EXCISION OF SKIN TAG N/A 02/22/2022   Procedure: EXCISION OF ANAL SKIN TAGS;  Surgeon: Debby Hila, MD;  Location: [facility];  Service: General;  Laterality: N/A;    Current Outpatient Medications  Medication Sig Dispense Refill   Acetaminophen  (MIDOL  PO) Take 1 tablet by mouth every 6 (six) hours as needed (cramping).     Continuous Glucose Sensor (DEXCOM G7 SENSOR) MISC Inject 1 Device into the skin See admin instructions. Place 1  new sensor into the skin every 10 days     dicyclomine  (BENTYL ) 10 MG capsule Take 10 mg by mouth 4 (four) times daily.     DULoxetine  (CYMBALTA ) 60 MG capsule Take 60 mg by mouth at bedtime.     hyoscyamine  (LEVSIN  SL) 0.125 MG SL tablet Place 1 tablet (0.125 mg total) under the tongue every 8 (eight) hours as needed for cramping. 30 tablet 0   insulin  aspart (NOVOLOG ) 100 UNIT/ML FlexPen Inject 0-9 Units into the skin 3 (three) times daily with meals. CBG < 70: Eat or  drink something sweet and recheck, CBG 70 - 120: 0 units CBG 121 - 150: 1 unit CBG 151 - 200: 2 units CBG 201 - 250: 3 units CBG 251 - 300: 5 units CBG 301 - 350: 7 units CBG 351 - 400: 9 units CBG > 400: call MD. 15 mL 2   insulin  glargine (LANTUS ) 100 UNIT/ML Solostar Pen Inject 12 Units into the skin daily. Shanica Castellanos substitute as needed per insurance. (Patient taking differently: Inject 40 Units into the skin 2 (two) times daily.) 10.8 mL 0   metoCLOPramide  (REGLAN ) 10 MG tablet Take 1 tablet (10 mg total) by mouth every 6 (six) hours as needed for nausea. 30 tablet 0   oxyCODONE  (ROXICODONE ) 15 MG immediate release tablet Take 15 mg by mouth 4 (four) times daily.     polyethylene glycol (MIRALAX  / GLYCOLAX ) 17 g packet Take 17 g by mouth daily as needed for mild constipation. 14 each 0   prochlorperazine  (COMPAZINE ) 5 MG tablet Take 1 tablet (5 mg total) by mouth every 12 (twelve) hours as needed for nausea or vomiting. 30 tablet 0   promethazine  (PHENERGAN ) 25 MG suppository Place 1 suppository (25 mg total) rectally every 6 (six) hours as needed for nausea or vomiting. 12 each 0   Prucalopride Succinate (MOTEGRITY) 2 MG TABS Take 1 tablet (2 mg total) by mouth daily. 90 tablet 0   pantoprazole  (PROTONIX ) 40 MG tablet Take 1 tablet (40 mg total) by mouth daily. 30 tablet 2   No current facility-administered medications for this visit.    Allergies as of 07/28/2024 - Review Complete 07/28/2024  Allergen Reaction Noted   Mirapex [pramipexole] Shortness Of Breath 11/09/2012    Family History  Problem Relation Age of Onset   Hypertension Mother    Glaucoma Mother    Diabetes Maternal Grandmother    Neuropathy Neg Hx    Colon cancer Neg Hx    Stomach cancer Neg Hx    Esophageal cancer Neg Hx    Colon polyps Neg Hx    Rectal cancer Neg Hx     Review of Systems:    Constitutional: No weight loss, fever, chills, weakness or fatigue HEENT: Eyes: No change in vision               Ears, Nose,  Throat:  No change in hearing or congestion Skin: No rash or itching Cardiovascular: No chest pain, chest pressure or palpitations   Respiratory: No SOB or cough Gastrointestinal: See HPI and otherwise negative Genitourinary: No dysuria or change in urinary frequency Neurological: No headache, dizziness or syncope Musculoskeletal: No new muscle or joint pain Hematologic: No bleeding or bruising Psychiatric: No history of depression or anxiety    Physical Exam:  Vital signs: BP 126/70   Pulse (!) 111   Ht 5' 6 (1.676 m)   Wt 151 lb (68.5 kg)   BMI 24.37 kg/m  Constitutional:   Pleasant female appears to be in NAD, Well developed, Well nourished, alert and cooperative Eyes:   PEERL, EOMI. No icterus. Conjunctiva pink. Neck:  Supple Throat: Oral cavity and pharynx without inflammation, swelling or lesion.  Respiratory: Respirations even and unlabored. Lungs clear to auscultation bilaterally.   No wheezes, crackles, or rhonchi.  Cardiovascular: Normal S1, S2. Regular rate and rhythm. No peripheral edema, cyanosis or pallor.  Gastrointestinal:  Soft, nondistended, generalized abd tenderness. No rebound or guarding. Hypoactive bowel sounds. No appreciable masses or hepatomegaly. Rectal:  Not performed.  Msk:  Symmetrical without gross deformities. Without edema, no deformity or joint abnormality.  Neurologic:  Alert and  oriented x4;  grossly normal neurologically.  Skin:   Dry and intact without significant lesions or rashes.  RELEVANT LABS AND IMAGING: CBC    Latest Ref Rng & Units 07/28/2024   10:05 AM 07/17/2024    8:07 PM 07/17/2024    6:47 PM  CBC  WBC 4.0 - 10.5 K/uL 9.0   9.0   Hemoglobin 12.0 - 15.0 g/dL 86.1  87.3  86.2   Hematocrit 36.0 - 46.0 % 41.2  37.0  39.2   Platelets 150.0 - 400.0 K/uL 393.0   354      CMP     Latest Ref Rng & Units 07/28/2024   10:05 AM 07/17/2024    8:07 PM 07/17/2024    6:47 PM  CMP  Glucose 70 - 99 mg/dL 886   781   BUN 6 - 23  mg/dL 6   12   Creatinine 9.59 - 1.20 mg/dL 9.30   9.33   Sodium 864 - 145 mEq/L 139  136  140   Potassium 3.5 - 5.1 mEq/L 4.0  3.6  3.7   Chloride 96 - 112 mEq/L 100   97   CO2 19 - 32 mEq/L 29   27   Calcium  8.4 - 10.5 mg/dL 89.7   89.1   Total Protein 6.0 - 8.3 g/dL 7.6   8.4   Total Bilirubin 0.2 - 1.2 mg/dL 0.5   0.4   Alkaline Phos 39 - 117 U/L 77   92   AST 0 - 37 U/L 13   19   ALT 0 - 35 U/L 8   8      Lab Results  Component Value Date   TSH 2.214 04/25/2024  Labs 02/2021: CBC with elevated WBC of 11.9 and low Hb of 11.9. BMP with low K of 2.9. Labs show 04/2024: Ha1c 7.9  04/01/23 echo- Left ventricular ejection fraction, by estimation, is 65 to 70%.   Imaging: 07/17/24 CTAP IMPRESSION: 1. No CT evidence for acute pancreatitis. 2. Cholelithiasis. 3. Large amount of stool throughout the colon. 4. Fibroid uterus. 5. Skin thickening and subcutaneous stranding in the mid right anterior abdominal wall. Correlate clinically for cellulitis. 6. Wall thickening of the distal esophagus similar to prior. Correlate clinically for esophagitis. 7. Aortic atherosclerosis.  06/28/24 abdominal xray IMPRESSION: 1. No acute abdominal abnormality identified.   CT A/P w/contrast 02/15/21: IMPRESSION: 1. Cholelithiasis. 2. Suspected uterine fibroid. Correlation with nonemergent pelvic ultrasound is recommended.   RUQ U/S 02/16/21: IMPRESSION: Probable sludge seen within gallbladder lumen. No other abnormality seen in the right upper quadrant of the abdomen.  12/27/21 EGD - Normal esophagus. Biopsied. - Gastritis. Biopsied. - Normal examined duodenum. Biopsied.   12/27/21 colonoscopy, recall 7 years - Nodular ileal mucosa. Biopsied. - Two 3 to 7 mm polyps in the transverse  colon and in the cecum, removed with a cold snare. Resected and retrieved. - Erythematous mucosa in the sigmoid colon, in the descending colon, in the transverse colon and in the ascending colon. Biopsied. - Non-  bleeding internal hemorrhoids.  Path: Diagnosis 1. Surgical [P], duodenal biopsies - DUODENAL MUCOSA WITH NO SPECIFIC PATHOLOGIC DIAGNOSIS. - NORMAL LONG AND SLENDER VILLI, NEGATIVE FOR PATHOLOGIC INTRAEPITHELIAL LYMPHOCYTOSIS. - PORTION OF GASTRIC MUCINOUS MUCOSA WITH MILD REACTIVE GASTROPATHY.  2. Surgical [P], gastric biopsies - REACTIVE GASTROPATHY. - NEGATIVE FOR AN INFLAMMATORY PATTERN PREDICTIVE OF HELICOBACTER PYLORI INFECTION. - NEGATIVE FOR INTESTINAL METAPLASIA AND MALIGNANCY.  3. Surgical [P], esophageal biopsies - SQUAMOUS MUCOSA WITH NO SPECIFIC PATHOLOGIC DIAGNOSIS. - NO GLANDULAR MUCOSA PRESENT.  4. Surgical [P], colon, transverse and cecum, polyp (2) - TUBULAR ADENOMAS (2), NEGATIVE FOR HIGH-GRADE DYSPLASIA.  5. Surgical [P], colon, ileal biopsies - TERMINAL ILEAL MUCOSA WITH LYMPHOID AGGREGATES (PEYER'S PATCHES). - NEGATIVE FOR ACTIVE ILEITIS, GRANULOMAS, ARCHITECTURAL DISTORTION AND PSEUDO PYLORIC METAPLASIA.  6. Surgical [P], random colon biopsies - COLONIC MUCOSA WITH NO SPECIFIC PATHOLOGIC DIAGNOSIS. - NEGATIVE FOR ACTIVE COLITIS AND PATHOLOGIC INTRAEPITHELIAL LYMPHOCYTOSIS.   Assessment: Encounter Diagnoses  Name Primary?   Nausea and vomiting, unspecified vomiting type Yes   Loss of weight    Poor appetite    Gastroesophageal reflux disease with esophagitis, unspecified whether hemorrhage    Abnormal CT scan, esophagus    Early satiety    Generalized abdominal pain    Opioid-induced constipation    Abdominal pain, epigastric    Hyperglycemia    Uncontrolled type 1 diabetes mellitus with hyperglycemia, with long-term current use of insulin  (HCC)    Chronic pain syndrome      38 year old female patient with past medical history of poorly managed diabetes, GERD, chronic pain syndrome, intractable cyclical vomiting and nausea, cannabis use disorder, constipation, and neuropathy. Presents with worsening nausea and vomiting, weight loss, poor appetite, abdominal pain, and  constipation. Reports she is not getting any relief from oral antiemetics, prokinetic Reglan , and/or pro secretory agent Linzess .  Recent CTAP showed: No CT evidence for acute pancreatitis. Lipase 146. Cholelithiasis. Large amount of stool throughout the colon. Wall thickening of the distal esophagus. Will provide short script of compazine  to see if this works better. Will also start her on promotility agent Motegrity to help with the constipation. History of tubular adenoma'ss recall colonoscopy 12/2028. Will need to obtain a gastric emptying study to rule out delayed gastric emptying, which I suspect due to poorly controlled DM. She is also on chronic opioids which could exacerbate her current issues by slowing motility. Due to recent weight loss and abnormal CT scan will proceed with upper GI endoscopy in [facility] with Dr. Suzann. Taneil Lazarus consider referring her to a speciality gastroparesis clinic pending workup.   Plan: - recheck CBC, CMET, lipase -check updated HA1c -Restart Pantoprazole  40 mg po daily -Recommend GERD diet, no late meals -Start Motegrity 2 mg po daily  -order 4 hr gastric emptying scan - Continue Marajuana cessation -stop dicyclomine  10 mg QID, not working -Can use hyoscyamine  0.125mg  SL prn abdominal pain -Schedule EGD in [facility] with Dr. Suzann. The risks and benefits of EGD with possible biopsies and esophageal dilation were discussed with the patient who agrees to proceed.   Thank you for the courtesy of this consult. Please call me with any questions or concerns.   Bette Brienza, FNP-C [facility] 07/28/2024, 12:55 PM  Cc: Cristopher Suzen HERO, NP  I have reviewed the  clinic note as outlined by Cathryne Beal, NP and agree with the assessment, plan and medical decision making.  Ms. McAdoo Collier is a 38 year old woman who presents to the persistent nausea, vomiting, epigastric abdominal pain and weight loss in the setting of poorly controlled diabetes as well as chronic  THC use.  During recent ER visit lipase was elevated at 146.  CTAP did not show evidence of pancreatitis.  There was thickening of the distal esophagus and a large stool burden.  Symptoms have not responded to PPI therapy, Reglan , typical antiemetics and Linzess .  I agree with restarting PPI, trial of Motegrity, EGD and scheduling a gastric emptying scan.  Inocente Hausen, MD

## 2024-07-28 NOTE — ED Notes (Signed)
 Pt has been sleeping, states her  pain nor nausea are any better

## 2024-07-28 NOTE — Discharge Instructions (Signed)
 Please call the family doctor.  Please return for worsening symptoms.

## 2024-07-28 NOTE — Patient Instructions (Signed)
 GERD -Restart Pantoprazole  40 mg po daily, 1 capsule 30-45 mins before breakfast -Recommend GERD diet  Constipation -Start Motegrity 2 mg po daily  -drink plenty of water  Nausea and vomiting -order 4 hr gastric emptying scan-->>  pending results Garner consider referral to specialist for other treatment options  - Continue to avoid [name] cessation -sent script for compazine  for nausea -push hydration/fluids -keep Ha1c below 7  Abdominal pain -stop dicyclomine  10 mg QID, not working - sent hyoscyamine  0.125mg  SL prn abdominal pain  Your provider has requested that you go to the basement level for lab work before leaving today. Press B on the elevator. The lab is located at the first door on the left as you exit the elevator.  You have been scheduled for a gastric emptying scan at [facility] ([facility]) on 08/04/24 at 8:00am. Please arrive at least 30 minutes prior to your appointment for registration. Please make certain not to have anything to eat or drink after midnight the night before your test. Hold all stomach medications (ex: Zofran , phenergan , Reglan ) 24 hours prior to your test. If you need to reschedule your appointment, please contact radiology scheduling at [phone]. _____________________________________________________________________ A gastric-emptying study measures how long it takes for food to move through your stomach. There are several ways to measure stomach emptying. In the most common test, you eat food that contains a small amount of radioactive material. A scanner that detects the movement of the radioactive material is placed over your abdomen to monitor the rate at which food leaves your stomach. This test normally takes about 4 hours to complete. _____________________________________________________________________   [name] have been scheduled for an endoscopy. Please follow written instructions given to you at your visit today.  If you use  inhalers (even only as needed), please bring them with you on the day of your procedure.  If you take any of the following medications, they will need to be adjusted prior to your procedure:   DO NOT TAKE 7 DAYS PRIOR TO TEST- Trulicity (dulaglutide) Ozempic, Wegovy (semaglutide) Mounjaro, Zepbound (tirzepatide) Bydureon Bcise (exanatide extended release)  DO NOT TAKE 1 DAY PRIOR TO YOUR TEST Rybelsus (semaglutide) Adlyxin (lixisenatide) Victoza (liraglutide) Byetta (exanatide) ___________________________________________________________________________  Due to recent changes in healthcare laws, you Garner see the results of your imaging and laboratory studies on MyChart before your provider has had a chance to review them.  We understand that in some cases there Garner be results that are confusing or concerning to you. Not all laboratory results come back in the same time frame and the provider Garner be waiting for multiple results in order to interpret others.  Please give us  48 hours in order for your provider to thoroughly review all the results before contacting the office for clarification of your results.   _______________________________________________________  If your blood pressure at your visit was 140/90 or greater, please contact your primary care physician to follow up on this.  _______________________________________________________  If you are age 68 or older, your body mass index should be between 23-30. Your Body mass index is 24.37 kg/m. If this is out of the aforementioned range listed, please consider follow up with your Primary Care Provider.  If you are age 31 or younger, your body mass index should be between 19-25. Your Body mass index is 24.37 kg/m. If this is out of the aformentioned range listed, please consider follow up with your Primary Care Provider.   ________________________________________________________  The [facility] providers would  like to encourage  you to use MYCHART to communicate with providers for non-urgent requests or questions.  Due to long hold times on the telephone, sending your provider a message by [facility] Garner be a faster and more efficient way to get a response.  Please allow 48 business hours for a response.  Please remember that this is for non-urgent requests.  _______________________________________________________  [facility] is using a team-based approach to care.  Your team is made up of your doctor and two to three APPS. Our APPS (Nurse Practitioners and Physician Assistants) work with your physician to ensure care continuity for you. They are fully qualified to address your health concerns and develop a treatment plan. They communicate directly with your gastroenterologist to care for you. Seeing the Advanced Practice Practitioners on your physician's team can help you by facilitating care more promptly, often allowing for earlier appointments, access to diagnostic testing, procedures, and other specialty referrals.   Thank you for trusting me with your gastrointestinal care. Caitlyn May, FNP-C

## 2024-07-28 NOTE — ED Provider Notes (Signed)
 [facility] AT [facility] Provider Note   CSN: [id number] Arrival date & time: 07/28/24  1655     Patient presents with: Hyperglycemia   [name] is a 38 y.o. female.   38 yo F with a chief complaint of abdominal discomfort.  Patient is having some trouble communicating.  She is unable or unwilling to tell me how long this has been going on.  She tells me it is diffuse about the abdomen.  Keeps repeating that her abdomen hurts.  Has had some vomiting.  Feels like she is been controlling her sugars okay.  Family when they checked in were concerned about the possibility of diabetic ketoacidosis.   Hyperglycemia      Prior to Admission medications   Medication Sig Start Date End Date Taking? Authorizing Provider  Acetaminophen  (MIDOL  PO) Take 1 tablet by mouth every 6 (six) hours as needed (cramping).    [provider]  Continuous Glucose Sensor (DEXCOM G7 SENSOR) MISC Inject 1 Device into the skin See admin instructions. Place 1 new sensor into the skin every 10 days    [provider]  dicyclomine  (BENTYL ) 10 MG capsule Take 10 mg by mouth 4 (four) times daily. 06/24/24   [provider]  DULoxetine  (CYMBALTA ) 60 MG capsule Take 60 mg by mouth at bedtime. 06/28/22   [provider]  hyoscyamine  (LEVSIN  SL) 0.125 MG SL tablet Place 1 tablet (0.125 mg total) under the tongue every 8 (eight) hours as needed for cramping. 07/28/24   May, [name], NP  insulin  aspart (NOVOLOG ) 100 UNIT/ML FlexPen Inject 0-9 Units into the skin 3 (three) times daily with meals. CBG < 70: Eat or drink something sweet and recheck, CBG 70 - 120: 0 units CBG 121 - 150: 1 unit CBG 151 - 200: 2 units CBG 201 - 250: 3 units CBG 251 - 300: 5 units CBG 301 - 350: 7 units CBG 351 - 400: 9 units CBG > 400: call MD. 08/07/23 06/28/25  [name], MD  insulin  glargine (LANTUS ) 100 UNIT/ML Solostar Pen Inject 12 Units into the skin  daily. May substitute as needed per insurance. Patient taking differently: Inject 40 Units into the skin 2 (two) times daily. 10/15/23 06/28/25  [place], [name], DO  metoCLOPramide  (REGLAN ) 10 MG tablet Take 1 tablet (10 mg total) by mouth every 6 (six) hours as needed for nausea. 07/10/24   Horton, [name], MD  oxyCODONE  (ROXICODONE ) 15 MG immediate release tablet Take 15 mg by mouth 4 (four) times daily. 04/28/24   [provider]  pantoprazole  (PROTONIX ) 40 MG tablet Take 1 tablet (40 mg total) by mouth daily. 07/28/24 07/28/25  May, [name], NP  polyethylene glycol (MIRALAX  / GLYCOLAX ) 17 g packet Take 17 g by mouth daily as needed for mild constipation. 04/26/24   Pokhrel, Vernal, MD  prochlorperazine  (COMPAZINE ) 5 MG tablet Take 1 tablet (5 mg total) by mouth every 12 (twelve) hours as needed for nausea or vomiting. 07/28/24   May, [name], NP  promethazine  (PHENERGAN ) 25 MG suppository Place 1 suppository (25 mg total) rectally every 6 (six) hours as needed for nausea or vomiting. 02/22/24   Horton, [name], MD  Prucalopride Succinate (MOTEGRITY) 2 MG TABS Take 1 tablet (2 mg total) by mouth daily. 07/28/24 10/26/24  May, [name], NP    Allergies: Mirapex [pramipexole]    Review of Systems  Updated Vital Signs BP (!) 183/104  Pulse (!) 101   Temp 98.9 F (37.2 C) (Oral)   Resp 18   Wt 69 kg   LMP 07/28/2024   SpO2 100%   BMI 24.55 kg/m   Physical Exam Vitals and nursing note reviewed.  Constitutional:      General: She is not in acute distress.    Appearance: She is well-developed. She is not diaphoretic.  HENT:     Head: Normocephalic and atraumatic.  Eyes:     Pupils: Pupils are equal, round, and reactive to light.  Cardiovascular:     Rate and Rhythm: Normal rate and regular rhythm.     Heart sounds: No murmur heard.    No friction rub. No gallop.  Pulmonary:     Effort: Pulmonary effort is normal.     Breath sounds: No wheezing or rales.  Abdominal:      General: There is no distension.     Palpations: Abdomen is soft.     Tenderness: There is no abdominal tenderness.     Comments: No obvious abdominal discomfort on exam  Musculoskeletal:        General: No tenderness.     Cervical back: Normal range of motion and neck supple.  Skin:    General: Skin is warm and dry.  Neurological:     Mental Status: She is alert.     Comments: Patient keeps repeating over and over it hurts it hurts.  Psychiatric:        Behavior: Behavior normal.     (all labs ordered are listed, but only abnormal results are displayed) Labs Reviewed  BASIC METABOLIC PANEL WITH GFR - Abnormal; Notable for the following components:      Result Value   Chloride 96 (*)    CO2 21 (*)    Glucose, Bld 305 (*)    Calcium  10.4 (*)    Anion gap 22 (*)    All other components within normal limits  BASIC METABOLIC PANEL WITH GFR - Abnormal; Notable for the following components:   Glucose, Bld 272 (*)    Anion gap 18 (*)    All other components within normal limits  BETA-HYDROXYBUTYRIC ACID - Abnormal; Notable for the following components:   Beta-Hydroxybutyric Acid 1.33 (*)    All other components within normal limits  CBC WITH DIFFERENTIAL/PLATELET - Abnormal; Notable for the following components:   Neutro Abs 8.9 (*)    All other components within normal limits  URINALYSIS, ROUTINE W REFLEX MICROSCOPIC - Abnormal; Notable for the following components:   Color, Urine COLORLESS (*)    Glucose, UA >1,000 (*)    Hgb urine dipstick LARGE (*)    Ketones, ur >80 (*)    Protein, ur 100 (*)    Leukocytes,Ua TRACE (*)    Bacteria, UA RARE (*)    All other components within normal limits  CBG MONITORING, ED - Abnormal; Notable for the following components:   Glucose-Capillary 274 (*)    All other components within normal limits  I-STAT VENOUS BLOOD GAS, ED - Abnormal; Notable for the following components:   pH, Ven 7.561 (*)    pCO2, Ven 28.2 (*)    Acid-Base  Excess 4.0 (*)    Calcium , Ion 1.04 (*)    All other components within normal limits  PREGNANCY, URINE  ETHANOL  HEPATIC FUNCTION PANEL  LIPASE, BLOOD  BASIC METABOLIC PANEL WITH GFR  BASIC METABOLIC PANEL WITH GFR  BETA-HYDROXYBUTYRIC ACID  BETA-HYDROXYBUTYRIC ACID  BETA-HYDROXYBUTYRIC ACID  BLOOD GAS, VENOUS  BASIC METABOLIC PANEL WITH GFR  BETA-HYDROXYBUTYRIC ACID    EKG: EKG Interpretation Date/Time:  Wednesday July 28 2024 17:00:15 EST Ventricular Rate:  106 PR Interval:  146 QRS Duration:  75 QT Interval:  364 QTC Calculation: 484 R Axis:   81  Text Interpretation: Sinus tachycardia Probable left atrial enlargement No significant change since last tracing Confirmed by Emil Share [phone]) on 07/28/2024 5:04:58 PM  Radiology: No results found.   Procedures   Medications Ordered in the ED  metoCLOPramide  (REGLAN ) injection 10 mg (10 mg Intravenous Given 07/28/24 1727)  diphenhydrAMINE  (BENADRYL ) injection 25 mg (25 mg Intravenous Given 07/28/24 1728)  metoCLOPramide  (REGLAN ) injection 10 mg (10 mg Intravenous Given 07/28/24 1902)  diphenhydrAMINE  (BENADRYL ) injection 25 mg (25 mg Intravenous Given 07/28/24 1901)  oxyCODONE  (Oxy IR/ROXICODONE ) immediate release tablet 15 mg (15 mg Oral Given 07/28/24 1900)  sodium chloride  0.9 % bolus 1,000 mL (0 mLs Intravenous Stopped 07/28/24 2005)  metoCLOPramide  (REGLAN ) injection 10 mg (10 mg Intravenous Given 07/28/24 2136)  diphenhydrAMINE  (BENADRYL ) injection 25 mg (25 mg Intravenous Given 07/28/24 2135)  oxyCODONE  (Oxy IR/ROXICODONE ) immediate release tablet 15 mg (15 mg Oral Given 07/28/24 2133)  sodium chloride  0.9 % bolus 1,000 mL (0 mLs Intravenous Stopped 07/28/24 2310)                                    Medical Decision Making Amount and/or Complexity of Data Reviewed Labs: ordered.  Risk Prescription drug management.   38 yo F with a chief complaints of abdominal discomforts.  Difficult history.   Patient was brought in rapidly by staff due to concern for syncope.  Patient seems to be awake and alert on my arrival to the patient room.  Complaining that her abdomen hurts.  Unable or unwilling to provide any further history.  Will obtain blood work treat nausea.  Reassess.  Initial blood work with respiratory alkalosis, no significant metabolic acidosis.  Anion gap elevation.  Blood sugar mildly elevated.  Patient reassessed and is feeling a little bit better.  She has been having symptoms for the past day.  Has trouble telling if this is consistent with DKA or with her cyclic vomiting.  No fevers.  No focal abdominal discomfort.  No urinary symptoms.  Patient was given multiple rounds of antiemetics was able to tolerate her pain medicine orally here.  Continued to improve.  Heart rate improved as well.  Repeat metabolic panel with continued improvement of her anion gap and resolution of her metabolic acidosis.  Patient would like to go home at this time.  PCP follow-up.  11:29 PM:  I have discussed the diagnosis/risks/treatment options with the patient.  Evaluation and diagnostic testing in the emergency department does not suggest an emergent condition requiring admission or immediate intervention beyond what has been performed at this time.  They will follow up with PCP. We also discussed returning to the ED immediately if new or worsening sx occur. We discussed the sx which are most concerning (e.g., sudden worsening pain, fever, inability to tolerate by mouth) that necessitate immediate return. Medications administered to the patient during their visit and any new prescriptions provided to the patient are listed below.  Medications given during this visit Medications  metoCLOPramide  (REGLAN ) injection 10 mg (10 mg Intravenous Given 07/28/24 1727)  diphenhydrAMINE  (BENADRYL ) injection 25 mg (25 mg Intravenous Given 07/28/24 1728)  metoCLOPramide  (REGLAN ) injection  10 mg (10 mg Intravenous  Given 07/28/24 1902)  diphenhydrAMINE  (BENADRYL ) injection 25 mg (25 mg Intravenous Given 07/28/24 1901)  oxyCODONE  (Oxy IR/ROXICODONE ) immediate release tablet 15 mg (15 mg Oral Given 07/28/24 1900)  sodium chloride  0.9 % bolus 1,000 mL (0 mLs Intravenous Stopped 07/28/24 2005)  metoCLOPramide  (REGLAN ) injection 10 mg (10 mg Intravenous Given 07/28/24 2136)  diphenhydrAMINE  (BENADRYL ) injection 25 mg (25 mg Intravenous Given 07/28/24 2135)  oxyCODONE  (Oxy IR/ROXICODONE ) immediate release tablet 15 mg (15 mg Oral Given 07/28/24 2133)  sodium chloride  0.9 % bolus 1,000 mL (0 mLs Intravenous Stopped 07/28/24 2310)     The patient appears reasonably screen and/or stabilized for discharge and I doubt any other medical condition or other [facility] requiring further screening, evaluation, or treatment in the ED at this time prior to discharge.       Final diagnoses:  Cyclical vomiting    ED Discharge Orders     None          Emil Share, DO 07/28/24 2329

## 2024-07-29 ENCOUNTER — Encounter: Payer: Self-pay | Admitting: Pediatrics

## 2024-07-29 ENCOUNTER — Ambulatory Visit: Admitting: Pediatrics

## 2024-07-29 VITALS — BP 126/93 | HR 100 | Temp 99.0°F | Resp 12 | Ht 66.0 in | Wt 151.0 lb

## 2024-07-29 DIAGNOSIS — K3189 Other diseases of stomach and duodenum: Secondary | ICD-10-CM | POA: Diagnosis not present

## 2024-07-29 DIAGNOSIS — R112 Nausea with vomiting, unspecified: Secondary | ICD-10-CM

## 2024-07-29 DIAGNOSIS — R634 Abnormal weight loss: Secondary | ICD-10-CM

## 2024-07-29 DIAGNOSIS — K2950 Unspecified chronic gastritis without bleeding: Secondary | ICD-10-CM | POA: Diagnosis not present

## 2024-07-29 DIAGNOSIS — K209 Esophagitis, unspecified without bleeding: Secondary | ICD-10-CM

## 2024-07-29 DIAGNOSIS — R109 Unspecified abdominal pain: Secondary | ICD-10-CM

## 2024-07-29 MED ORDER — PANTOPRAZOLE SODIUM 40 MG PO TBEC: 40.0000 mg | DELAYED_RELEASE_TABLET | Freq: Two times a day (BID) | ORAL | 6 refills | Status: AC

## 2024-07-29 MED ORDER — SODIUM CHLORIDE 0.9 % IV SOLN: 500.0000 mL | INTRAVENOUS | Status: DC

## 2024-07-29 NOTE — Progress Notes (Signed)
 Pt's states no medical or surgical changes since previsit or office visit.

## 2024-07-29 NOTE — Progress Notes (Signed)
 Called to room to assist during endoscopic procedure.  Patient ID and intended procedure confirmed with present staff. Received instructions for my participation in the procedure from the performing physician.

## 2024-07-29 NOTE — Progress Notes (Signed)
 Report given to PACU, vss

## 2024-07-29 NOTE — Progress Notes (Signed)
1405 Robinul 0.1 mg IV given due large amount of secretions upon assessment.  MD made aware, vss  

## 2024-07-29 NOTE — Patient Instructions (Addendum)
 -Handout on esophagitis provided. -Continue current medications. -await for pathology.  YOU HAD AN ENDOSCOPIC PROCEDURE TODAY AT THE [facility]:   Refer to the procedure report that was given to you for any specific questions about what was found during the examination.  If the procedure report does not answer your questions, please call your gastroenterologist to clarify.  If you requested that your care partner not be given the details of your procedure findings, then the procedure report has been included in a sealed envelope for you to review at your convenience later.  YOU SHOULD EXPECT: Some feelings of bloating in the abdomen. Passage of more gas than usual.  Walking can help get rid of the air that was put into your GI tract during the procedure and reduce the bloating. If you had a lower endoscopy (such as a colonoscopy or flexible sigmoidoscopy) you may notice spotting of blood in your stool or on the toilet paper. If you underwent a bowel prep for your procedure, you may not have a normal bowel movement for a few days.  Please Note:  You might notice some irritation and congestion in your nose or some drainage.  This is from the oxygen used during your procedure.  There is no need for concern and it should clear up in a day or so.  SYMPTOMS TO REPORT IMMEDIATELY:  Following upper endoscopy (EGD)  Vomiting of blood or coffee ground material  New chest pain or pain under the shoulder blades  Painful or persistently difficult swallowing  New shortness of breath  Fever of 100F or higher  Black, tarry-looking stools  For urgent or emergent issues, a gastroenterologist can be reached at any hour by calling [phone]. Do not use MyChart messaging for urgent concerns.    DIET:  We do recommend a small meal at first, but then you may proceed to your regular diet.  Drink plenty of fluids but you should avoid alcoholic beverages for 24 hours.  ACTIVITY:  You should plan  to take it easy for the rest of today and you should NOT DRIVE or use heavy machinery until tomorrow (because of the sedation medicines used during the test).    FOLLOW UP: Our staff will call the number listed on your records the next business day following your procedure.  We will call around 7:15- 8:00 am to check on you and address any questions or concerns that you may have regarding the information given to you following your procedure. If we do not reach you, we will leave a message.     If any biopsies were taken you will be contacted by phone or by letter within the next 1-3 weeks.  Please call us  at [phone] if you have not heard about the biopsies in 3 weeks.    SIGNATURES/CONFIDENTIALITY: You and/or your care partner have signed paperwork which will be entered into your electronic medical record.  These signatures attest to the fact that that the information above on your After Visit Summary has been reviewed and is understood.  Full responsibility of the confidentiality of this discharge information lies with you and/or your care-partner.  Irritation and Swelling of the Esophagus (Esophagitis): What to Know  Esophagitis is irritation and swelling of the esophagus. The esophagus is the part of your body that moves food from your mouth to your stomach.  Esophagitis can cause soreness or pain in your esophagus. It can make it hard or painful to swallow. What are the  causes? Most causes of esophagitis aren't serious. Common causes include: Gastroesophageal reflux disease (GERD). This is when stomach contents move back up into your esophagus. An allergic reaction. This may be caused by food allergies. An injury to your esophagus. This may happen if: You swallow large pills. You swallow certain medicines. You swallow harmful chemicals. These include cleaning products. You throw up a lot. Drinking a lot of alcohol or smoking. An infection. This may happen if your body has a weak  defense system (immune system). Certain treatments for cancer. Certain diseases. These include: Sarcoidosis. Crohn's disease. Scleroderma. What are the signs or symptoms? Trouble swallowing. Pain when you swallow. This may include when you swallow acidic liquids, such as orange juice. You may also have pain when you burp. Chest pain or trouble breathing. Throwing up or feeling like you may throw up. Pain in your belly. Weight loss. Throwing up blood. Poop that is black, tarry, or bright red. How is this diagnosed? Esophagitis may be diagnosed based on your medical history and an exam. You may also have other tests. These may include: An endoscopy. This test looks at your stomach and esophagus with a small camera. Tests of your esophagus to check for: Acid levels. Pressure. A barium swallow test or modified barium swallow test. This can show: The shape and size of your esophagus. How well your esophagus is working. Allergy tests. How is this treated? Treatment depends on what is causing the esophagitis. You may be given medicines to help with symptoms. You may also need to: Avoid alcohol. Quit smoking. Change your diet. Exercise. Stay at a healthy weight. Change your sleep habits and how you sleep. Follow these instructions at home: Medicines Take your medicines only as told. Do not take aspirin  or ibuprofen  unless you're told to. If you have trouble taking pills: Use a pill splitter. This can lower the chance of the pill getting stuck or hurting your esophagus. Drink water after you take a pill. Eating and drinking Stay away from foods and drinks that seem to make your symptoms worse. Eat and drink as told by your health care provider. You may need to avoid certain foods and drinks. These may include: Coffee and tea, with or without caffeine . Energy drinks and sports drinks. Fizzy drinks or sodas. Chocolate and cocoa. Peppermint and mint flavorings. Garlic and  onions. Spicy and acidic foods. These include: Peppers. Horseradish. Chili powder and curry powder. Vinegar. Hot sauces and BBQ sauce. Citrus fruits and juices. These include: Oranges. Lemons. Limes. Tomato-based foods. These include: Red sauce and pizza with red sauce. Chili. Salsa. Fried and fatty foods. These include: Donuts. French fries. Potato chips. High-fat dressings. High-fat meats. These include: Hot dogs and sausages. Ribeye steak. Ham and bacon. High-fat dairy items. These include: Whole milk. Butter. Cream cheese. Lifestyle Eat small meals often. Avoid eating big meals. Avoid drinking lots of liquid with your meals. Try not to eat meals during the 2-3 hours before bedtime. Try not to lie down right after you eat. Do not exercise right after you eat. Do not smoke, vape, or use nicotine or tobacco. General instructions  Watch for any changes in your symptoms. Wear loose clothes. Do not wear things that are tight around your waist. Raise the head of your bed about 6 inches (15 cm). You can use a wedge to do this. Try yoga, deep breathing, or meditation to help manage stress. If you're overweight, lose an amount of weight that's healthy for you. Ask your  provider about a safe weight loss goal. Contact a health care provider if: Your symptoms don't get better with treatment. You have new symptoms. You lose weight and don't know why. You have trouble swallowing, or it hurts to swallow. You have a cough, or you start wheezing. This is when you make high-pitched whistling sounds when you breathe, most often when you breathe out. You have heartburn for more than 2 weeks. You have a fever. Get help right away if: You have very bad pain in your arms, neck, jaw, teeth, or back. You feel sweaty, dizzy, or light-headed all of a sudden. You have chest pain or feel short of breath. You throw up and: It's green, yellow, or black. It looks like blood or coffee  grounds. Your poop is red, bloody, or black. You can't swallow, drink, or eat. These symptoms may be an emergency. Call 911 right away. Do not wait to see if the symptoms will go away. Do not drive yourself to the hospital. This information is not intended to replace advice given to you by your health care provider. Make sure you discuss any questions you have with your health care provider. Document Revised: 02/26/2023 Document Reviewed: 02/26/2023 Elsevier Patient Education  2024 [employer].

## 2024-07-29 NOTE — Progress Notes (Signed)
 [facility] History and Physical   Primary Care Physician:  Cristopher Suzen HERO, NP   Reason for Procedure:  Nausea, vomiting, epigastric abdominal pain, weight loss  Plan:    Upper endoscopy   The patient was provided an opportunity to ask questions and all were answered. The patient agreed with the plan.   HPI: [name] is a 38 y.o. female undergoing upper endoscopy for investigation, epigastric abdominal pain and weight loss.  Patient reports a chronic history of recurrent symptoms.  At the time of recent ER visit she had a mildly elevated lipase 146 but normal CT without evidence of pancreatitis.  Distal esophagus appeared thickened-suspect related to esophagitis from recurrent vomiting. Large stool burden present. There was a history of cannabis use and diabetes raising possibility.  Diabetic gastroparesis and cannabinoid hyperemesis syndrome.  Past Medical History:  Diagnosis Date   Chronic constipation 11/08/2021   Diabetic neuropathy (HCC)    History of chicken pox    History of noncompliance with medical treatment 09/07/2022   Type 1 diabetes (HCC)    Vaginal odor 11/08/2021    Past Surgical History:  Procedure Laterality Date   CESAREAN SECTION     only one C/S per pt.   CO2 LASER APPLICATION N/A 02/22/2022   Procedure: CO2 LASER APPLICATION TO VULVA;  Surgeon: Viktoria Comer SAUNDERS, MD;  Location: [facility];  Service: General;  Laterality: N/A;   DILATION AND CURETTAGE OF UTERUS     EXCISION OF SKIN TAG N/A 02/22/2022   Procedure: EXCISION OF ANAL SKIN TAGS;  Surgeon: Debby Hila, MD;  Location: [facility];  Service: General;  Laterality: N/A;    Prior to Admission medications  Medication Sig Start Date End Date Taking? Authorizing Provider  Acetaminophen  (MIDOL  PO) Take 1 tablet by mouth every 6 (six) hours as needed (cramping).    [provider]  Continuous Glucose Sensor (DEXCOM G7 SENSOR) MISC  Inject 1 Device into the skin See admin instructions. Place 1 new sensor into the skin every 10 days    [provider]  dicyclomine  (BENTYL ) 10 MG capsule Take 10 mg by mouth 4 (four) times daily. 06/24/24   [provider]  DULoxetine  (CYMBALTA ) 60 MG capsule Take 60 mg by mouth at bedtime. 06/28/22   [provider]  hyoscyamine  (LEVSIN  SL) 0.125 MG SL tablet Place 1 tablet (0.125 mg total) under the tongue every 8 (eight) hours as needed for cramping. 07/28/24   May, [name], NP  insulin  aspart (NOVOLOG ) 100 UNIT/ML FlexPen Inject 0-9 Units into the skin 3 (three) times daily with meals. CBG < 70: Eat or drink something sweet and recheck, CBG 70 - 120: 0 units CBG 121 - 150: 1 unit CBG 151 - 200: 2 units CBG 201 - 250: 3 units CBG 251 - 300: 5 units CBG 301 - 350: 7 units CBG 351 - 400: 9 units CBG > 400: call MD. 08/07/23 06/28/25  [name], MD  insulin  glargine (LANTUS ) 100 UNIT/ML Solostar Pen Inject 12 Units into the skin daily. May substitute as needed per insurance. Patient taking differently: Inject 40 Units into the skin 2 (two) times daily. 10/15/23 06/28/25  [place], [name], DO  metoCLOPramide  (REGLAN ) 10 MG tablet Take 1 tablet (10 mg total) by mouth every 6 (six) hours as needed for nausea. 07/10/24   Horton, [name], MD  oxyCODONE  (ROXICODONE ) 15 MG immediate release tablet Take 15 mg by mouth 4 (four)  times daily. 04/28/24   [provider]  pantoprazole  (PROTONIX ) 40 MG tablet Take 1 tablet (40 mg total) by mouth daily. 07/28/24 07/28/25  May, [name], NP  polyethylene glycol (MIRALAX  / GLYCOLAX ) 17 g packet Take 17 g by mouth daily as needed for mild constipation. 04/26/24   Pokhrel, Vernal, MD  prochlorperazine  (COMPAZINE ) 5 MG tablet Take 1 tablet (5 mg total) by mouth every 12 (twelve) hours as needed for nausea or vomiting. 07/28/24   May, [name], NP  promethazine  (PHENERGAN ) 25 MG suppository Place 1 suppository (25 mg total)  rectally every 6 (six) hours as needed for nausea or vomiting. 02/22/24   Horton, [name], MD  Prucalopride Succinate (MOTEGRITY) 2 MG TABS Take 1 tablet (2 mg total) by mouth daily. 07/28/24 10/26/24  May, [name], NP    Current Outpatient Medications  Medication Sig Dispense Refill   DULoxetine  (CYMBALTA ) 60 MG capsule Take 60 mg by mouth at bedtime.     hyoscyamine  (LEVSIN  SL) 0.125 MG SL tablet Place 1 tablet (0.125 mg total) under the tongue every 8 (eight) hours as needed for cramping. 30 tablet 0   insulin  aspart (NOVOLOG ) 100 UNIT/ML FlexPen Inject 0-9 Units into the skin 3 (three) times daily with meals. CBG < 70: Eat or drink something sweet and recheck, CBG 70 - 120: 0 units CBG 121 - 150: 1 unit CBG 151 - 200: 2 units CBG 201 - 250: 3 units CBG 251 - 300: 5 units CBG 301 - 350: 7 units CBG 351 - 400: 9 units CBG > 400: call MD. 15 mL 2   insulin  glargine (LANTUS ) 100 UNIT/ML Solostar Pen Inject 12 Units into the skin daily. May substitute as needed per insurance. (Patient taking differently: Inject 40 Units into the skin 2 (two) times daily.) 10.8 mL 0   oxyCODONE  (ROXICODONE ) 15 MG immediate release tablet Take 15 mg by mouth 4 (four) times daily.     pantoprazole  (PROTONIX ) 40 MG tablet Take 1 tablet (40 mg total) by mouth daily. 30 tablet 2   prochlorperazine  (COMPAZINE ) 5 MG tablet Take 1 tablet (5 mg total) by mouth every 12 (twelve) hours as needed for nausea or vomiting. 30 tablet 0   Acetaminophen  (MIDOL  PO) Take 1 tablet by mouth every 6 (six) hours as needed (cramping).     Continuous Glucose Sensor (DEXCOM G7 SENSOR) MISC Inject 1 Device into the skin See admin instructions. Place 1 new sensor into the skin every 10 days     dicyclomine  (BENTYL ) 10 MG capsule Take 10 mg by mouth 4 (four) times daily.     metoCLOPramide  (REGLAN ) 10 MG tablet Take 1 tablet (10 mg total) by mouth every 6 (six) hours as needed for nausea. 30 tablet 0   polyethylene glycol (MIRALAX  / GLYCOLAX ) 17  g packet Take 17 g by mouth daily as needed for mild constipation. 14 each 0   promethazine  (PHENERGAN ) 25 MG suppository Place 1 suppository (25 mg total) rectally every 6 (six) hours as needed for nausea or vomiting. 12 each 0   Prucalopride Succinate (MOTEGRITY) 2 MG TABS Take 1 tablet (2 mg total) by mouth daily. 90 tablet 0   Current Facility-Administered Medications  Medication Dose Route Frequency Provider Last Rate Last Admin   0.9 %  sodium chloride  infusion  500 mL Intravenous Continuous Johnattan Strassman, Inocente HERO, MD        Allergies as of 07/29/2024 - Review Complete 07/29/2024  Allergen Reaction Noted  Mirapex [pramipexole] Shortness Of Breath 11/09/2012    Family History  Problem Relation Age of Onset   Hypertension Mother    Glaucoma Mother    Diabetes Maternal Grandmother    Neuropathy Neg Hx    Colon cancer Neg Hx    Stomach cancer Neg Hx    Esophageal cancer Neg Hx    Colon polyps Neg Hx    Rectal cancer Neg Hx     Social History   Socioeconomic History   Marital status: Married    Spouse name: Not on file   Number of children: 2   Years of education: Not on file   Highest education level: High school graduate  Occupational History   Occupation: Unemployed  Tobacco Use   Smoking status: Never    Passive exposure: Never   Smokeless tobacco: Never  Vaping Use   Vaping status: Never Used  Substance and Sexual Activity   Alcohol use: Yes    Comment: socially   Drug use: Not Currently    Types: Marijuana   Sexual activity: Yes    Birth control/protection: None  Other Topics Concern   Not on file  Social History Narrative   Lives at home with her husband and children   Right handed   Caffeine : tea, 1 cup in the morning.   Social Drivers of Health   Tobacco Use: Low Risk (07/29/2024)   Patient History    Smoking Tobacco Use: Never    Smokeless Tobacco Use: Never    Passive Exposure: Never  Financial Resource Strain: Not on file  Food Insecurity: No  Food Insecurity (04/25/2024)   Epic    Worried About Programme Researcher, Broadcasting/film/video in the Last Year: Never true    Ran Out of Food in the Last Year: Never true  Transportation Needs: No Transportation Needs (04/25/2024)   Epic    Lack of Transportation (Medical): No    Lack of Transportation (Non-Medical): No  Physical Activity: Not on file  Stress: Not on file  Social Connections: Moderately Integrated (10/12/2023)   Social Connection and Isolation Panel    Frequency of Communication with Friends and Family: More than three times a week    Frequency of Social Gatherings with Friends and Family: More than three times a week    Attends Religious Services: 1 to 4 times per year    Active Member of [employer] or Organizations: No    Attends Banker Meetings: Never    Marital Status: Married  Catering Manager Violence: Not At Risk (04/25/2024)   Epic    Fear of Current or Ex-Partner: No    Emotionally Abused: No    Physically Abused: No    Sexually Abused: No  Depression (PHQ2-9): Low Risk (11/08/2021)   Depression (PHQ2-9)    PHQ-2 Score: 3  Alcohol Screen: Not on file  Housing: Low Risk (04/25/2024)   Epic    Unable to Pay for Housing in the Last Year: No    Number of Times Moved in the Last Year: 0    Homeless in the Last Year: No  Utilities: Not At Risk (04/25/2024)   Epic    Threatened with loss of utilities: No  Health Literacy: Not on file    Review of Systems:  All other review of systems negative except as mentioned in the HPI.  Physical Exam: Vital signs BP (!) 167/114   Pulse (!) 115   Temp 99 F (37.2 C) (Temporal)   Resp 18  Ht 5' 6 (1.676 m)   Wt 151 lb (68.5 kg)   LMP 07/28/2024   SpO2 99%   BMI 24.37 kg/m   General:   Alert,  Well-developed, well-nourished, pleasant and cooperative in NAD Airway:  Mallampati 1 Lungs:  Clear throughout to auscultation.   Heart:  Regular rate and rhythm; no murmurs, clicks, rubs,  or gallops. Abdomen:  Soft, nontender and  nondistended. Normal bowel sounds.   Neuro/Psych:  Normal mood and affect. A and O x 3  Inocente Hausen, MD [facility]

## 2024-07-29 NOTE — Op Note (Signed)
 [facility] Patient Name: [name] Procedure Date: 07/29/2024 1:52 PM MRN: [id number] Endoscopist: Inocente Hausen , MD, [id number] Age: 38 Referring MD:  Date of Birth: Aug 04, 1986 Gender: Female Account #: [id number] Procedure:                Upper GI endoscopy Indications:              Generalized abdominal pain, Nausea with vomiting,                            Weight loss Medicines:                Monitored Anesthesia Care Procedure:                Pre-Anesthesia Assessment:                           - Prior to the procedure, a History and Physical                            was performed, and patient medications and                            allergies were reviewed. The patient's tolerance of                            previous anesthesia was also reviewed. The risks                            and benefits of the procedure and the sedation                            options and risks were discussed with the patient.                            All questions were answered, and informed consent                            was obtained. Prior Anticoagulants: The patient has                            taken no anticoagulant or antiplatelet agents. ASA                            Grade Assessment: II - A patient with mild systemic                            disease. After reviewing the risks and benefits,                            the patient was deemed in satisfactory condition to                            undergo the procedure.  After obtaining informed consent, the endoscope was                            passed under direct vision. Throughout the                            procedure, the patient's blood pressure, pulse, and                            oxygen saturations were monitored continuously. The                            GIF [id number] #[id number] was introduced through the                            mouth, and advanced to the second  part of duodenum.                            The upper GI endoscopy was accomplished without                            difficulty. The patient tolerated the procedure                            well. Scope In: Scope Out: Findings:                 The upper third of the esophagus and middle third                            of the esophagus were normal.                           LA Grade D (one or more mucosal breaks involving at                            least 75% of esophageal circumference) esophagitis                            was found in the lower third of the esophagus.                            Biopsies were taken with a cold forceps for                            histology.                           The gastric body, gastric antrum, cardia (on                            retroflexion) and gastric fundus (on retroflexion)                            were normal. Biopsies were  taken with a cold                            forceps for Helicobacter pylori testing.                           Localized nodular mucosa was found in the duodenal                            bulb. Biopsies were taken with a cold forceps for                            histology.                           The second portion of the duodenum was normal.                            Biopsies for histology were taken with a cold                            forceps for evaluation of celiac disease. Complications:            No immediate complications. Estimated blood loss:                            Minimal. Estimated Blood Loss:     Estimated blood loss was minimal. Impression:               - Normal upper third of esophagus and middle third                            of esophagus.                           - LA Grade D erosive esophagitis. Biopsied.                           - Normal gastric body, antrum, cardia and gastric                            fundus. Biopsied.                           - Nodular mucosa in  the duodenal bulb. Biopsied.                            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                           - Normal second portion of the duodenum. Biopsied. Recommendation:           - Discharge patient to home (ambulatory).                           -  Await pathology results.                           - Increase pantoprazole  to 40 mg p.o. twice daily                            while awaiting pathology results                           - Continue present medications                           - Repeat EGD in 2 to 3 months for confirmation of                            healing of esophagitis.                           - Schedule follow-up clinic visit in 4 to 6 weeks                            with Dr. Suzann or APP (Deanna May or Alan Coombs).                           - Follow-up results of pending gastric emptying scan                           - If vomiting persists in the future with normal                            workup otherwise, consider repeat CNS imaging and                            cortisol level to evaluate for adrenal                            insufficiency.                           - The findings and recommendations were discussed                            with the patient's family.                           - Patient has a contact number available for                            emergencies. The signs and symptoms of potential                            delayed complications were discussed with the  patient. Return to normal activities tomorrow.                            Written discharge instructions were provided to the                            patient. Inocente Hausen, MD 07/29/2024 2:27:58 PM This report has been signed electronically.

## 2024-07-29 NOTE — Progress Notes (Signed)
 Repeat EGD scheduled for 10/15/24. Instructions printed and reviewed with pt. Order placed for ambulatory referral.

## 2024-07-30 ENCOUNTER — Telehealth: Payer: Self-pay

## 2024-07-30 NOTE — Telephone Encounter (Signed)
°  Follow up Call-     07/29/2024    1:16 PM 12/27/2021    1:04 PM  Call back number  Post procedure Call Back phone  # [phone]  Permission to leave phone message Yes Yes     Patient questions:  Do you have a fever, pain , or abdominal swelling? No. Pain Score  0 *  Have you tolerated food without any problems? Yes.    Have you been able to return to your normal activities? Yes.    Do you have any questions about your discharge instructions: Diet   No. Medications  No. Follow up visit  No.  Do you have questions or concerns about your Care? No.  Actions: * If pain score is 4 or above: No action needed, pain <4.

## 2024-08-03 LAB — SURGICAL PATHOLOGY

## 2024-08-04 ENCOUNTER — Ambulatory Visit (HOSPITAL_COMMUNITY): Attending: Gastroenterology

## 2024-08-04 ENCOUNTER — Ambulatory Visit: Payer: Self-pay | Admitting: Pediatrics

## 2024-08-16 ENCOUNTER — Encounter (HOSPITAL_BASED_OUTPATIENT_CLINIC_OR_DEPARTMENT_OTHER): Payer: Self-pay | Admitting: Emergency Medicine

## 2024-08-16 ENCOUNTER — Emergency Department (HOSPITAL_BASED_OUTPATIENT_CLINIC_OR_DEPARTMENT_OTHER)

## 2024-08-16 ENCOUNTER — Other Ambulatory Visit: Payer: Self-pay

## 2024-08-16 ENCOUNTER — Emergency Department (HOSPITAL_BASED_OUTPATIENT_CLINIC_OR_DEPARTMENT_OTHER)
Admission: EM | Admit: 2024-08-16 | Discharge: 2024-08-16 | Disposition: A | Discharge status: Home / Self Care | Attending: Emergency Medicine | Admitting: Emergency Medicine

## 2024-08-16 DIAGNOSIS — G8929 Other chronic pain: Secondary | ICD-10-CM | POA: Insufficient documentation

## 2024-08-16 DIAGNOSIS — Z794 Long term (current) use of insulin: Secondary | ICD-10-CM | POA: Insufficient documentation

## 2024-08-16 DIAGNOSIS — R112 Nausea with vomiting, unspecified: Secondary | ICD-10-CM | POA: Diagnosis not present

## 2024-08-16 DIAGNOSIS — R000 Tachycardia, unspecified: Secondary | ICD-10-CM | POA: Diagnosis not present

## 2024-08-16 DIAGNOSIS — R0602 Shortness of breath: Secondary | ICD-10-CM | POA: Diagnosis not present

## 2024-08-16 DIAGNOSIS — R1084 Generalized abdominal pain: Secondary | ICD-10-CM | POA: Diagnosis present

## 2024-08-16 DIAGNOSIS — E1065 Type 1 diabetes mellitus with hyperglycemia: Secondary | ICD-10-CM | POA: Diagnosis not present

## 2024-08-16 LAB — COMPREHENSIVE METABOLIC PANEL WITH GFR
ALT: 6 U/L (ref 0–44)
AST: 14 U/L — ABNORMAL LOW (ref 15–41)
Albumin: 4.7 g/dL (ref 3.5–5.0)
Alkaline Phosphatase: 85 U/L (ref 38–126)
Anion gap: 17 — ABNORMAL HIGH (ref 5–15)
BUN: 6 mg/dL (ref 6–20)
CO2: 26 mmol/L (ref 22–32)
Calcium: 10.8 mg/dL — ABNORMAL HIGH (ref 8.9–10.3)
Chloride: 93 mmol/L — ABNORMAL LOW (ref 98–111)
Creatinine, Ser: 0.74 mg/dL (ref 0.44–1.00)
GFR, Estimated: >60 mL/min
Glucose, Bld: 341 mg/dL — ABNORMAL HIGH (ref 70–99)
Potassium: 4.1 mmol/L (ref 3.5–5.1)
Sodium: 136 mmol/L (ref 135–145)
Total Bilirubin: 0.5 mg/dL (ref 0.0–1.2)
Total Protein: 8.3 g/dL — ABNORMAL HIGH (ref 6.5–8.1)

## 2024-08-16 LAB — CBC WITH DIFFERENTIAL/PLATELET
Abs Immature Granulocytes: 0.03 K/uL (ref 0.00–0.07)
Basophils Absolute: 0 K/uL (ref 0.0–0.1)
Basophils Relative: 0 %
Eosinophils Absolute: 0 K/uL (ref 0.0–0.5)
Eosinophils Relative: 0 %
HCT: 38.3 % (ref 36.0–46.0)
Hemoglobin: 13.5 g/dL (ref 12.0–15.0)
Immature Granulocytes: 0 %
Lymphocytes Relative: 20 %
Lymphs Abs: 1.6 K/uL (ref 0.7–4.0)
MCH: 30.9 pg (ref 26.0–34.0)
MCHC: 35.2 g/dL (ref 30.0–36.0)
MCV: 87.6 fL (ref 80.0–100.0)
Monocytes Absolute: 0.3 K/uL (ref 0.1–1.0)
Monocytes Relative: 4 %
Neutro Abs: 6.2 K/uL (ref 1.7–7.7)
Neutrophils Relative %: 76 %
Platelets: 373 K/uL (ref 150–400)
RBC: 4.37 MIL/uL (ref 3.87–5.11)
RDW: 13.2 % (ref 11.5–15.5)
WBC: 8.2 K/uL (ref 4.0–10.5)
nRBC: 0 % (ref 0.0–0.2)

## 2024-08-16 LAB — CBG MONITORING, ED: Glucose-Capillary: 339 mg/dL — ABNORMAL HIGH (ref 70–99)

## 2024-08-16 LAB — BETA-HYDROXYBUTYRIC ACID: Beta-Hydroxybutyric Acid: 1.8 mmol/L — ABNORMAL HIGH (ref 0.05–0.27)

## 2024-08-16 LAB — LIPASE, BLOOD: Lipase: 12 U/L (ref 11–51)

## 2024-08-16 MED ORDER — PROMETHAZINE HCL 25 MG RE SUPP: 25.0000 mg | Freq: Four times a day (QID) | RECTAL | 0 refills | Status: DC | PRN

## 2024-08-16 MED ORDER — METOCLOPRAMIDE HCL 5 MG/ML IJ SOLN
10.0000 mg | Freq: Once | INTRAMUSCULAR | Status: AC
  Administered 2024-08-16: 10 mg via INTRAVENOUS
  Filled 2024-08-16: qty 2

## 2024-08-16 MED ORDER — DROPERIDOL 2.5 MG/ML IJ SOLN
2.5000 mg | Freq: Once | INTRAMUSCULAR | Status: AC
  Administered 2024-08-16: 2.5 mg via INTRAVENOUS

## 2024-08-16 MED ORDER — OXYCODONE HCL 5 MG PO TABS
20.0000 mg | ORAL_TABLET | Freq: Once | ORAL | Status: AC
  Administered 2024-08-16: 20 mg via ORAL
  Filled 2024-08-16: qty 4

## 2024-08-16 MED ORDER — METOCLOPRAMIDE HCL 10 MG PO TABS: 10.0000 mg | ORAL_TABLET | Freq: Four times a day (QID) | ORAL | 0 refills | Status: AC

## 2024-08-16 MED ORDER — LORAZEPAM 2 MG/ML IJ SOLN
1.0000 mg | Freq: Once | INTRAMUSCULAR | Status: AC
  Administered 2024-08-16: 1 mg via INTRAVENOUS
  Filled 2024-08-16: qty 1

## 2024-08-16 MED ORDER — LACTATED RINGERS IV BOLUS
1000.0000 mL | Freq: Once | INTRAVENOUS | Status: AC
  Administered 2024-08-16: 1000 mL via INTRAVENOUS

## 2024-08-16 NOTE — ED Notes (Signed)
 Pt states she has a ride home. DC reviewed with pt.

## 2024-08-16 NOTE — Discharge Instructions (Addendum)
 Please read and follow all provided instructions.  Your diagnoses today include:  1. Chronic abdominal pain   2. Nausea and vomiting, unspecified vomiting type     Tests performed today include: Complete blood cell count: Normal blood cell counts Complete metabolic panel: High blood sugar but no complications noted from this Lipase (pancreas function test): Was normal Beta-hydroxybutyrate: Minimally elevated but no evidence of diabetic ketoacidosis Blood gas: Did not show evidence of diabetic ketoacidosis Vital signs. See below for your results today.   Medications prescribed:  Reglan : Medication for nausea and vomiting  Phenergan  suppositories: Medication for nausea and vomiting  Take any prescribed medications only as directed.  Home care instructions:  Follow any educational materials contained in this packet.  Follow-up instructions: Please follow-up with your primary care provider in the next 2 days for further evaluation of your symptoms.    Return instructions:  SEEK IMMEDIATE MEDICAL ATTENTION IF: The pain does not go away or becomes severe  A temperature above 101F develops  Repeated vomiting occurs (multiple episodes)  The pain becomes localized to portions of the abdomen. The right side could possibly be appendicitis. In an adult, the left lower portion of the abdomen could be colitis or diverticulitis.  Blood is being passed in stools or vomit (bright red or black tarry stools)  You develop chest pain, difficulty breathing, dizziness or fainting, or become confused, poorly responsive, or inconsolable (young children) If you have any other emergent concerns regarding your health  Additional Information: Abdominal (belly) pain can be caused by many things. Your caregiver performed an examination and possibly ordered blood/urine tests and imaging (CT scan, x-rays, ultrasound). Many cases can be observed and treated at home after initial evaluation in the emergency  department. Even though you are being discharged home, abdominal pain can be unpredictable. Therefore, you need a repeated exam if your pain does not resolve, returns, or worsens. Most patients with abdominal pain don't have to be admitted to the hospital or have surgery, but serious problems like appendicitis and gallbladder attacks can start out as nonspecific pain. Many abdominal conditions cannot be diagnosed in one visit, so follow-up evaluations are very important.  Your vital signs today were: BP (!) 176/103 (BP Location: Left Arm)   Pulse 93   Temp 98.4 F (36.9 C) (Oral)   Resp 18   LMP 07/28/2024   SpO2 100%  If your blood pressure (bp) was elevated above 135/85 this visit, please have this repeated by your doctor within one month. --------------

## 2024-08-16 NOTE — ED Triage Notes (Signed)
 Reports generalized body aches, n/v, and SHOB starting last night.

## 2024-08-16 NOTE — ED Notes (Signed)
CBG 339 

## 2024-08-16 NOTE — ED Notes (Signed)
 ED Provider at bedside.

## 2024-08-16 NOTE — ED Provider Notes (Signed)
 " [place] EMERGENCY DEPARTMENT AT [facility] Provider Note   CSN: [id number] Arrival date & time: 08/16/24  9091     Patient presents with: Emesis   [name] is a 39 y.o. female.   Patient with history of type 1 diabetes complicated by neuropathy/chronic pain, cyclic vomiting syndrome with cannabis use, diabetic gastroparesis, recent endoscopy 07/2024 showing distal esophagitis currently on pantoprazole .  For chronic pain patient is currently on 20 mg oxycodone  4 times a day.  Patient presents for evaluation of nausea and abdominal pain.  She developed worsening abdominal pain over the past 2 days.  This morning she began vomiting, prompting emergency department visit today.  Pain is generalized in the abdomen.  She reports shortness of breath with vomiting.  No fever or cough.  No runny nose or sore throat.  No urinary symptoms.       Prior to Admission medications  Medication Sig Start Date End Date Taking? Authorizing Provider  Acetaminophen  (MIDOL  PO) Take 1 tablet by mouth every 6 (six) hours as needed (cramping).    [provider]  Continuous Glucose Sensor (DEXCOM G7 SENSOR) MISC Inject 1 Device into the skin See admin instructions. Place 1 new sensor into the skin every 10 days    [provider]  dicyclomine  (BENTYL ) 10 MG capsule Take 10 mg by mouth 4 (four) times daily. 06/24/24   [provider]  DULoxetine  (CYMBALTA ) 60 MG capsule Take 60 mg by mouth at bedtime. 06/28/22   [provider]  hyoscyamine  (LEVSIN  SL) 0.125 MG SL tablet Place 1 tablet (0.125 mg total) under the tongue every 8 (eight) hours as needed for cramping. 07/28/24   May, [name], NP  insulin  aspart (NOVOLOG ) 100 UNIT/ML FlexPen Inject 0-9 Units into the skin 3 (three) times daily with meals. CBG < 70: Eat or drink something sweet and recheck, CBG 70 - 120: 0 units CBG 121 - 150: 1 unit CBG 151 - 200: 2 units CBG 201 - 250: 3 units CBG 251 -  300: 5 units CBG 301 - 350: 7 units CBG 351 - 400: 9 units CBG > 400: call MD. 08/07/23 06/28/25  [name], MD  insulin  glargine (LANTUS ) 100 UNIT/ML Solostar Pen Inject 12 Units into the skin daily. May substitute as needed per insurance. Patient taking differently: Inject 40 Units into the skin 2 (two) times daily. 10/15/23 06/28/25  [place], Camellia PARAS, DO  metoCLOPramide  (REGLAN ) 10 MG tablet Take 1 tablet (10 mg total) by mouth every 6 (six) hours as needed for nausea. 07/10/24   Horton, [name], MD  oxyCODONE  (ROXICODONE ) 15 MG immediate release tablet Take 15 mg by mouth 4 (four) times daily. 04/28/24   [provider]  pantoprazole  (PROTONIX ) 40 MG tablet Take 1 tablet (40 mg total) by mouth 2 (two) times daily. 07/29/24   Suzann Inocente HERO, MD  polyethylene glycol (MIRALAX  / GLYCOLAX ) 17 g packet Take 17 g by mouth daily as needed for mild constipation. 04/26/24   Pokhrel, Vernal, MD  prochlorperazine  (COMPAZINE ) 5 MG tablet Take 1 tablet (5 mg total) by mouth every 12 (twelve) hours as needed for nausea or vomiting. 07/28/24   May, [name], NP  promethazine  (PHENERGAN ) 25 MG suppository Place 1 suppository (25 mg total) rectally every 6 (six) hours as needed for nausea or vomiting. 02/22/24   Horton, [name], MD  Prucalopride Succinate  (MOTEGRITY ) 2 MG TABS Take 1 tablet (2 mg total) by mouth daily.  07/28/24 10/26/24  May, [name], NP    Allergies: Mirapex [pramipexole]    Review of Systems  Updated Vital Signs Temp 98.9 F (37.2 C) (Oral)   Resp 20   LMP 07/28/2024   SpO2 100%   Physical Exam Vitals and nursing note reviewed.  Constitutional:      General: She is in acute distress.     Appearance: She is well-developed.     Comments: Patient tearful at times  HENT:     Head: Normocephalic and atraumatic.     Right Ear: External ear normal.     Left Ear: External ear normal.     Nose: Nose normal.     Mouth/Throat:     Mouth: Mucous membranes are moist.   Eyes:     Conjunctiva/sclera: Conjunctivae normal.  Cardiovascular:     Rate and Rhythm: Regular rhythm. Tachycardia present.     Heart sounds: No murmur heard. Pulmonary:     Effort: No respiratory distress.     Breath sounds: No wheezing, rhonchi or rales.  Abdominal:     Palpations: Abdomen is soft.     Tenderness: There is no abdominal tenderness. There is no guarding or rebound.     Comments: Generalized tenderness, no focal pain.  Patient does not wince or react with palpation of the abdomen.  Musculoskeletal:     Cervical back: Normal range of motion and neck supple.     Right lower leg: No edema.     Left lower leg: No edema.  Skin:    General: Skin is warm and dry.     Findings: No rash.  Neurological:     General: No focal deficit present.     Mental Status: She is alert. Mental status is at baseline.     Motor: No weakness.  Psychiatric:        Mood and Affect: Mood normal.     (all labs ordered are listed, but only abnormal results are displayed) Labs Reviewed  COMPREHENSIVE METABOLIC PANEL WITH GFR - Abnormal; Notable for the following components:      Result Value   Chloride 93 (*)    Glucose, Bld 341 (*)    Calcium  10.8 (*)    Total Protein 8.3 (*)    AST 14 (*)    Anion gap 17 (*)    All other components within normal limits  BETA-HYDROXYBUTYRIC ACID - Abnormal; Notable for the following components:   Beta-Hydroxybutyric Acid 1.80 (*)    All other components within normal limits  CBC WITH DIFFERENTIAL/PLATELET  LIPASE, BLOOD  PREGNANCY, URINE  URINE DRUG SCREEN  URINALYSIS, ROUTINE W REFLEX MICROSCOPIC  CBG MONITORING, ED  I-STAT VENOUS BLOOD GAS, ED    EKG: None  Radiology: No results found.   Procedures   Medications Ordered in the ED  droperidol  (INAPSINE ) 2.5 MG/ML injection 2.5 mg (has no administration in time range)  lactated ringers  bolus 1,000 mL (has no administration in time range)   ED Course  Patient seen and examined.  History obtained directly from patient.  Reviewed previous ED notes, and recent endoscopy.  Labs/EKG: Ordered CBC, CMP, lipase, UA, pregnancy, UDS, VBG, beta hydroxybutyrate.  Personally reviewed and interpreted EKG, shows tachycardia, no prolonged QT.  Imaging: Ordered chest x-ray due to reported shortness of breath.  Medications/Fluids: Ordered: LR bolus, IV droperidol .   Most recent vital signs reviewed and are as follows: Temp 98.9 F (37.2 C) (Oral)   Resp 20   LMP 07/28/2024  SpO2 100%   Initial impression: Patient with chronic abdominal pain, cyclic vomiting in setting of diabetes.  Will need to evaluate for DKA and other diabetic complications.  Patient arrives requesting IV narcotic pain medication.  She volunteers that she can no longer take IV Toradol .  I discussed with patient that narcotics can worsen gastroparesis and cyclic vomiting.  I told her that we would focus on treatment of her nausea and vomiting and try to get her to the point where she can tolerate her home medications.  She requests droperidol  specifically.  She is also requesting IV medication for rest.  2:03 PM Reassessment performed. Patient appears stable.  On several rechecks, patient has mainly been sleeping.  No vomiting observed but she continues to complain of nausea.  Patient was given droperidol .  Later she received a dose of Reglan  and tolerated this well.  Prior to discharge I gave her a dose of IV Ativan .  Labs personally reviewed and interpreted including: CBC unremarkable; CMP glucose high at 341, creatinine normal, anion gap 17 but normal bicarb; lipase normal; beta hydroxybutyrate slightly elevated.  Venous blood gas not crossing over however pH 7.539, pCO2 32.5, pO2 40, bicarb 27.8, total CO2 29, sodium 131, potassium 4.0, ionized calcium  1.04, hemoglobin 13.9.  Reviewed pertinent lab work and imaging with patient at bedside. Questions answered.   Most current vital signs reviewed and are as  follows: BP (!) 176/103 (BP Location: Left Arm)   Pulse 93   Temp 98.4 F (36.9 C) (Oral)   Resp 18   LMP 07/28/2024   SpO2 100%   Plan: Discharge to home.  At this time, no emergent indications for admission.  Patient continues not to feel very well, but she has not vomiting any longer.  She has tolerated her home pain medication.  Prescriptions written for: Reglan , Phenergan  suppositories  Other home care instructions discussed: Clear liquid diet, slow advancement, continued home medications  ED return instructions discussed: The patient was urged to return to the Emergency Department immediately with worsening of current symptoms, worsening abdominal pain, persistent vomiting, blood noted in stools, fever, or any other concerns. The patient verbalized understanding.   Follow-up instructions discussed: Patient encouraged to follow-up with their PCP in 2 days.                                   Medical Decision Making Amount and/or Complexity of Data Reviewed Labs: ordered. Radiology: ordered.  Risk Prescription drug management.   For this patient's complaint of abdominal pain, the following conditions were considered on the differential diagnosis: gastritis/PUD, enteritis/duodenitis, appendicitis, cholelithiasis/cholecystitis, cholangitis, pancreatitis, ruptured viscus, colitis, diverticulitis, small/large bowel obstruction, proctitis, cystitis, pyelonephritis, ureteral colic, aortic dissection, aortic aneurysm. In women, ectopic pregnancy, pelvic inflammatory disease, ovarian cysts, and tubo-ovarian abscess were also considered. Atypical chest etiologies were also considered including ACS, PE, and pneumonia.  Patient has a history of diabetic gastroparesis/cyclic vomiting.  This appears to be a recurrence of this.  She was screened for evidence of diabetic ketoacidosis.  This is negative.  Abdomen is generally tender.  No elevated white blood cell count.  Low concern for acute  abdominal emergency.  Symptoms are controlled here without any further vomiting.  She is tolerating her home pain medication and I do not suspect significant withdrawal.  The patient's vital signs, pertinent lab work and imaging were reviewed and interpreted as discussed in the ED course. Hospitalization  was considered for further testing, treatments, or serial exams/observation. However as patient is well-appearing, has a stable exam, and reassuring studies today, I do not feel that they warrant admission at this time. This plan was discussed with the patient who verbalizes agreement and comfort with this plan and seems reliable and able to return to the Emergency Department with worsening or changing symptoms.        Final diagnoses:  Chronic abdominal pain  Nausea and vomiting, unspecified vomiting type    ED Discharge Orders          Ordered    metoCLOPramide  (REGLAN ) 10 MG tablet  Every 6 hours        08/16/24 1401    promethazine  (PHENERGAN ) 25 MG suppository  Every 6 hours PRN        08/16/24 1401               Desiderio Chew, PA-C 08/16/24 1406  "

## 2024-08-18 LAB — I-STAT VENOUS BLOOD GAS, ED
Acid-Base Excess: 5 mmol/L — ABNORMAL HIGH (ref 0.0–2.0)
Bicarbonate: 27.8 mmol/L (ref 20.0–28.0)
Calcium, Ion: 1.04 mmol/L — ABNORMAL LOW (ref 1.15–1.40)
HCT: 41 % (ref 36.0–46.0)
Hemoglobin: 13.9 g/dL (ref 12.0–15.0)
O2 Saturation: 82 %
Potassium: 4 mmol/L (ref 3.5–5.1)
Sodium: 131 mmol/L — ABNORMAL LOW (ref 135–145)
TCO2: 29 mmol/L (ref 22–32)
pCO2, Ven: 32.5 mmHg — ABNORMAL LOW (ref 44–60)
pH, Ven: 7.539 — ABNORMAL HIGH (ref 7.25–7.43)
pO2, Ven: 40 mmHg (ref 32–45)

## 2024-09-11 ENCOUNTER — Other Ambulatory Visit: Payer: Self-pay

## 2024-09-11 ENCOUNTER — Emergency Department (HOSPITAL_BASED_OUTPATIENT_CLINIC_OR_DEPARTMENT_OTHER)
Admission: EM | Admit: 2024-09-11 | Discharge: 2024-09-11 | Disposition: A | Discharge status: Home / Self Care | Attending: Emergency Medicine | Admitting: Emergency Medicine

## 2024-09-11 ENCOUNTER — Encounter (HOSPITAL_BASED_OUTPATIENT_CLINIC_OR_DEPARTMENT_OTHER): Payer: Self-pay | Admitting: *Deleted

## 2024-09-11 DIAGNOSIS — D72829 Elevated white blood cell count, unspecified: Secondary | ICD-10-CM | POA: Diagnosis not present

## 2024-09-11 DIAGNOSIS — Z794 Long term (current) use of insulin: Secondary | ICD-10-CM | POA: Insufficient documentation

## 2024-09-11 DIAGNOSIS — R112 Nausea with vomiting, unspecified: Secondary | ICD-10-CM | POA: Insufficient documentation

## 2024-09-11 DIAGNOSIS — R1084 Generalized abdominal pain: Secondary | ICD-10-CM | POA: Diagnosis present

## 2024-09-11 DIAGNOSIS — R000 Tachycardia, unspecified: Secondary | ICD-10-CM | POA: Insufficient documentation

## 2024-09-11 DIAGNOSIS — E109 Type 1 diabetes mellitus without complications: Secondary | ICD-10-CM | POA: Diagnosis not present

## 2024-09-11 HISTORY — DX: Gastroparesis: K31.84

## 2024-09-11 LAB — CBC
HCT: 37.9 % (ref 36.0–46.0)
Hemoglobin: 13.1 g/dL (ref 12.0–15.0)
MCH: 30.7 pg (ref 26.0–34.0)
MCHC: 34.6 g/dL (ref 30.0–36.0)
MCV: 88.8 fL (ref 80.0–100.0)
Platelets: 351 10*3/uL (ref 150–400)
RBC: 4.27 MIL/uL (ref 3.87–5.11)
RDW: 13.1 % (ref 11.5–15.5)
WBC: 11.4 10*3/uL — ABNORMAL HIGH (ref 4.0–10.5)
nRBC: 0 % (ref 0.0–0.2)

## 2024-09-11 LAB — COMPREHENSIVE METABOLIC PANEL WITH GFR
ALT: 21 U/L (ref 0–44)
AST: 25 U/L (ref 15–41)
Albumin: 4.7 g/dL (ref 3.5–5.0)
Alkaline Phosphatase: 100 U/L (ref 38–126)
Anion gap: 17 — ABNORMAL HIGH (ref 5–15)
BUN: 7 mg/dL (ref 6–20)
CO2: 28 mmol/L (ref 22–32)
Calcium: 10.8 mg/dL — ABNORMAL HIGH (ref 8.9–10.3)
Chloride: 95 mmol/L — ABNORMAL LOW (ref 98–111)
Creatinine, Ser: 0.65 mg/dL (ref 0.44–1.00)
GFR, Estimated: >60 mL/min
Glucose, Bld: 142 mg/dL — ABNORMAL HIGH (ref 70–99)
Potassium: 3.8 mmol/L (ref 3.5–5.1)
Sodium: 139 mmol/L (ref 135–145)
Total Bilirubin: 0.4 mg/dL (ref 0.0–1.2)
Total Protein: 7.9 g/dL (ref 6.5–8.1)

## 2024-09-11 LAB — CBG MONITORING, ED: Glucose-Capillary: 132 mg/dL — ABNORMAL HIGH (ref 70–99)

## 2024-09-11 LAB — LIPASE, BLOOD: Lipase: 31 U/L (ref 11–51)

## 2024-09-11 MED ORDER — LACTATED RINGERS IV BOLUS
1000.0000 mL | Freq: Once | INTRAVENOUS | Status: AC
  Administered 2024-09-11: 1000 mL via INTRAVENOUS

## 2024-09-11 MED ORDER — HYDROMORPHONE HCL 1 MG/ML IJ SOLN
0.5000 mg | Freq: Once | INTRAMUSCULAR | Status: AC
  Administered 2024-09-11: 0.5 mg via INTRAVENOUS
  Filled 2024-09-11: qty 1

## 2024-09-11 MED ORDER — PROCHLORPERAZINE EDISYLATE 10 MG/2ML IJ SOLN
10.0000 mg | Freq: Once | INTRAMUSCULAR | Status: AC
  Administered 2024-09-11: 10 mg via INTRAVENOUS
  Filled 2024-09-11: qty 2

## 2024-09-11 MED ORDER — DIPHENHYDRAMINE HCL 50 MG/ML IJ SOLN
25.0000 mg | Freq: Once | INTRAMUSCULAR | Status: AC
  Administered 2024-09-11: 25 mg via INTRAVENOUS
  Filled 2024-09-11: qty 1

## 2024-09-11 NOTE — ED Triage Notes (Signed)
 Pt with hx of gastroparesis is here for generalized abdominal pain which began today.  Pt is in distress, crying and unable to hold still on arrival.  Pt has had nausea and vomiting with this.

## 2024-09-11 NOTE — ED Provider Notes (Signed)
 " [place] EMERGENCY DEPARTMENT AT [facility] Provider Note   CSN: [id number] Arrival date & time: 09/11/24  1608     Patient presents with: Abdominal Pain   [name] is a 39 y.o. female.   HPI     39 year old female with a history of type 1 diabetes, diabetic neuropathy, gastroparesis, chronic pain, presents with concern for abdominal pain, nausea and vomiting.  Early this AM began to have abdominal pain, all over pain with history of neuropathy, usually receives compazine , benadryl  and dilaudid  to help with the neuropathy.  Gastroparesis. No fevers.  Nausea and vomiting, vomited all morning and twice since she has been here, 4-5 times today No diarrhea Tried pepto bismol Last time took it was a few days ago, doesn't help No urinary symptoms, no vaginal bleeding or discharge Reports this is the same pain that she has with her gastroparesis and neuropathy.  Reports the only thing that helps with her neuropathy is the Dilaudid .  Her family shares concerns regarding her reliance on opiate medications, however, they do not like to see her suffering like this and would prefer for her to get narcotic medications at this time to help her with her pain.  She has not had her oxycodone  for several days.  Dr. Cristopher chronic pain doctor.    Past Medical History:  Diagnosis Date   Chronic constipation 11/08/2021   Diabetic neuropathy (HCC)    Gastroparesis    History of chicken pox    History of noncompliance with medical treatment 09/07/2022   Type 1 diabetes (HCC)    Vaginal odor 11/08/2021     Prior to Admission medications  Medication Sig Start Date End Date Taking? Authorizing Provider  Acetaminophen  (MIDOL  PO) Take 1 tablet by mouth every 6 (six) hours as needed (cramping).    [provider]  Continuous Glucose Sensor (DEXCOM G7 SENSOR) MISC Inject 1 Device into the skin See admin instructions. Place 1 new sensor into the skin every 10 days     [provider]  dicyclomine  (BENTYL ) 10 MG capsule Take 10 mg by mouth 4 (four) times daily. 06/24/24   [provider]  DULoxetine  (CYMBALTA ) 60 MG capsule Take 60 mg by mouth at bedtime. 06/28/22   [provider]  hyoscyamine  (LEVSIN  SL) 0.125 MG SL tablet Place 1 tablet (0.125 mg total) under the tongue every 8 (eight) hours as needed for cramping. 07/28/24   May, [name], NP  insulin  aspart (NOVOLOG ) 100 UNIT/ML FlexPen Inject 0-9 Units into the skin 3 (three) times daily with meals. CBG < 70: Eat or drink something sweet and recheck, CBG 70 - 120: 0 units CBG 121 - 150: 1 unit CBG 151 - 200: 2 units CBG 201 - 250: 3 units CBG 251 - 300: 5 units CBG 301 - 350: 7 units CBG 351 - 400: 9 units CBG > 400: call MD. 08/07/23 06/28/25  [name], MD  insulin  glargine (LANTUS ) 100 UNIT/ML Solostar Pen Inject 12 Units into the skin daily. May substitute as needed per insurance. Patient taking differently: Inject 40 Units into the skin 2 (two) times daily. 10/15/23 06/28/25  [place], [name], DO  metoCLOPramide  (REGLAN ) 10 MG tablet Take 1 tablet (10 mg total) by mouth every 6 (six) hours. 08/16/24   Geiple, Joshua, PA-C  oxyCODONE  (ROXICODONE ) 15 MG immediate release tablet Take 15 mg by mouth 4 (four) times daily. 04/28/24   [provider]  pantoprazole  (PROTONIX ) 40 MG  tablet Take 1 tablet (40 mg total) by mouth 2 (two) times daily. 07/29/24   Suzann Inocente HERO, MD  polyethylene glycol (MIRALAX  / GLYCOLAX ) 17 g packet Take 17 g by mouth daily as needed for mild constipation. 04/26/24   Pokhrel, Vernal, MD  prochlorperazine  (COMPAZINE ) 5 MG tablet Take 1 tablet (5 mg total) by mouth every 12 (twelve) hours as needed for nausea or vomiting. 07/28/24   May, [name], NP  promethazine  (PHENERGAN ) 25 MG suppository Place 1 suppository (25 mg total) rectally every 6 (six) hours as needed for nausea or vomiting. 08/16/24   Desiderio Chew, PA-C  Prucalopride Succinate   (MOTEGRITY ) 2 MG TABS Take 1 tablet (2 mg total) by mouth daily. 07/28/24 10/26/24  May, [name], NP    Allergies: Mirapex [pramipexole]    Review of Systems  Updated Vital Signs BP 124/86   Pulse (!) 107   Temp 97.8 F (36.6 C) (Oral)   Resp 20   LMP 09/04/2024   SpO2 100%   Physical Exam Vitals and nursing note reviewed.  Constitutional:      General: She is in acute distress (crying in pain).     Appearance: She is well-developed. She is ill-appearing. She is not diaphoretic.  HENT:     Head: Normocephalic and atraumatic.  Eyes:     Conjunctiva/sclera: Conjunctivae normal.  Cardiovascular:     Rate and Rhythm: Regular rhythm. Tachycardia present.  Pulmonary:     Effort: Pulmonary effort is normal. No respiratory distress.  Abdominal:     General: There is no distension.     Palpations: Abdomen is soft.     Tenderness: There is generalized abdominal tenderness. There is no guarding.  Musculoskeletal:        General: No tenderness.     Cervical back: Normal range of motion.  Skin:    General: Skin is warm and dry.     Findings: No erythema or rash.  Neurological:     Mental Status: She is alert and oriented to person, place, and time.     (all labs ordered are listed, but only abnormal results are displayed) Labs Reviewed  COMPREHENSIVE METABOLIC PANEL WITH GFR - Abnormal; Notable for the following components:      Result Value   Chloride 95 (*)    Glucose, Bld 142 (*)    Calcium  10.8 (*)    Anion gap 17 (*)    All other components within normal limits  CBC - Abnormal; Notable for the following components:   WBC 11.4 (*)    All other components within normal limits  CBG MONITORING, ED - Abnormal; Notable for the following components:   Glucose-Capillary 132 (*)    All other components within normal limits  LIPASE, BLOOD    EKG: None  Radiology: No results found.   Procedures   Medications Ordered in the ED  lactated ringers  bolus 1,000 mL (0  mLs Intravenous Stopped 09/11/24 1853)  HYDROmorphone  (DILAUDID ) injection 0.5 mg (0.5 mg Intravenous Given 09/11/24 1744)  prochlorperazine  (COMPAZINE ) injection 10 mg (10 mg Intravenous Given 09/11/24 1747)  diphenhydrAMINE  (BENADRYL ) injection 25 mg (25 mg Intravenous Given 09/11/24 1748)                                    Medical Decision Making Amount and/or Complexity of Data Reviewed Labs: ordered.  Risk Prescription drug management.    39 year old female with  a history of type 1 diabetes, diabetic neuropathy, gastroparesis, chronic pain, presents with concern for abdominal pain, nausea and vomiting.  DDx includes appendicitis, pancreatitis, cholecystitis, pyelonephritis, nephrolithiasis, diverticulitis, SBO, HHS, DKA, gastroparesis, chronic pain, opiate withdrawal.   Labs completed and personally by and interpreted by me show mild anion gap, however normal bicarb, labs not consistent with DKA.  No transaminitis or signs of pancreatitis.  White blood cell count is mildly elevated which is nonspecific.  She left the emergency department prior to providing urine for pregnancy test.  Clinically, have low suspicion for SBO, pancreatitis, cholecystitis, diverticulitis, nephrolithiasis.  Her symptoms seem to be consistent with her prior noted episodes of gastroparesis and chronic abdominal pain.  Discussed concern that opiates are going to make her gastroparesis worse, and that I recommend nonnarcotic medications for her pain.  She is tearful, stating that only the Dilaudid  helps with her pain from the neuropathy.  Discussed in detail with her family concerned that she has an opiate dependence/addiction that is contributing to her episodes of pain, and we discussed goals of speaking more about this with her pain physician. Gave one dose of pain medicine, nausea medicine and gave fluids.  She stated to nursing she was feeling better and had a family emergency and left prior to receiving  paperwork.        Final diagnoses:  Generalized abdominal pain    ED Discharge Orders     None          Dreama Longs, MD 09/12/24 [phone]  "

## 2024-09-11 NOTE — Discharge Instructions (Addendum)
 Happy Birthday! Please follow up with your pain physician and PCP as discussed.

## 2024-09-11 NOTE — ED Notes (Addendum)
 Wrapped her IV with Kerlix and tape, additionally applied seizure pads to bed, due to pt's thrashing around in bed.  She is obeying commands and does settle down when asked to, but states it hurts.

## 2024-09-13 ENCOUNTER — Ambulatory Visit: Admitting: Gastroenterology

## 2024-09-17 ENCOUNTER — Other Ambulatory Visit: Payer: Self-pay | Admitting: Gastroenterology

## 2024-09-17 DIAGNOSIS — R1084 Generalized abdominal pain: Secondary | ICD-10-CM

## 2024-09-19 ENCOUNTER — Inpatient Hospital Stay (HOSPITAL_COMMUNITY)
Admission: EM | Admit: 2024-09-19 | Discharge: 2024-09-21 | DRG: 897 | Disposition: A | Attending: Internal Medicine | Admitting: Internal Medicine

## 2024-09-19 ENCOUNTER — Other Ambulatory Visit: Payer: Self-pay

## 2024-09-19 ENCOUNTER — Encounter (HOSPITAL_COMMUNITY): Payer: Self-pay

## 2024-09-19 DIAGNOSIS — E1043 Type 1 diabetes mellitus with diabetic autonomic (poly)neuropathy: Secondary | ICD-10-CM | POA: Diagnosis present

## 2024-09-19 DIAGNOSIS — R109 Unspecified abdominal pain: Secondary | ICD-10-CM | POA: Diagnosis present

## 2024-09-19 DIAGNOSIS — F1123 Opioid dependence with withdrawal: Secondary | ICD-10-CM | POA: Diagnosis present

## 2024-09-19 DIAGNOSIS — E1042 Type 1 diabetes mellitus with diabetic polyneuropathy: Secondary | ICD-10-CM | POA: Diagnosis present

## 2024-09-19 DIAGNOSIS — E1065 Type 1 diabetes mellitus with hyperglycemia: Secondary | ICD-10-CM | POA: Diagnosis not present

## 2024-09-19 DIAGNOSIS — Z794 Long term (current) use of insulin: Secondary | ICD-10-CM

## 2024-09-19 DIAGNOSIS — K2100 Gastro-esophageal reflux disease with esophagitis, without bleeding: Secondary | ICD-10-CM | POA: Diagnosis present

## 2024-09-19 DIAGNOSIS — E10649 Type 1 diabetes mellitus with hypoglycemia without coma: Secondary | ICD-10-CM | POA: Diagnosis present

## 2024-09-19 DIAGNOSIS — Z833 Family history of diabetes mellitus: Secondary | ICD-10-CM

## 2024-09-19 DIAGNOSIS — K209 Esophagitis, unspecified without bleeding: Secondary | ICD-10-CM

## 2024-09-19 DIAGNOSIS — Z83511 Family history of glaucoma: Secondary | ICD-10-CM

## 2024-09-19 DIAGNOSIS — G894 Chronic pain syndrome: Secondary | ICD-10-CM | POA: Diagnosis present

## 2024-09-19 DIAGNOSIS — F1193 Opioid use, unspecified with withdrawal: Principal | ICD-10-CM

## 2024-09-19 DIAGNOSIS — E876 Hypokalemia: Secondary | ICD-10-CM | POA: Diagnosis present

## 2024-09-19 DIAGNOSIS — E162 Hypoglycemia, unspecified: Secondary | ICD-10-CM | POA: Diagnosis present

## 2024-09-19 DIAGNOSIS — E860 Dehydration: Secondary | ICD-10-CM | POA: Diagnosis present

## 2024-09-19 DIAGNOSIS — Z8249 Family history of ischemic heart disease and other diseases of the circulatory system: Secondary | ICD-10-CM | POA: Diagnosis not present

## 2024-09-19 DIAGNOSIS — K3184 Gastroparesis: Secondary | ICD-10-CM | POA: Diagnosis present

## 2024-09-19 DIAGNOSIS — R112 Nausea with vomiting, unspecified: Secondary | ICD-10-CM | POA: Diagnosis present

## 2024-09-19 LAB — CBC WITH DIFFERENTIAL/PLATELET
Abs Immature Granulocytes: 0.12 10*3/uL — ABNORMAL HIGH (ref 0.00–0.07)
Basophils Absolute: 0 10*3/uL (ref 0.0–0.1)
Basophils Relative: 0 %
Eosinophils Absolute: 0 10*3/uL (ref 0.0–0.5)
Eosinophils Relative: 0 %
HCT: 38 % (ref 36.0–46.0)
Hemoglobin: 13 g/dL (ref 12.0–15.0)
Immature Granulocytes: 1 %
Lymphocytes Relative: 19 %
Lymphs Abs: 3.3 10*3/uL (ref 0.7–4.0)
MCH: 31.3 pg (ref 26.0–34.0)
MCHC: 34.2 g/dL (ref 30.0–36.0)
MCV: 91.6 fL (ref 80.0–100.0)
Monocytes Absolute: 1 10*3/uL (ref 0.1–1.0)
Monocytes Relative: 6 %
Neutro Abs: 12.5 10*3/uL — ABNORMAL HIGH (ref 1.7–7.7)
Neutrophils Relative %: 74 %
Platelets: 448 10*3/uL — ABNORMAL HIGH (ref 150–400)
RBC: 4.15 MIL/uL (ref 3.87–5.11)
RDW: 13.1 % (ref 11.5–15.5)
WBC: 16.9 10*3/uL — ABNORMAL HIGH (ref 4.0–10.5)
nRBC: 0 % (ref 0.0–0.2)

## 2024-09-19 LAB — I-STAT CHEM 8, ED
BUN: 5 mg/dL — ABNORMAL LOW (ref 6–20)
Calcium, Ion: 1.19 mmol/L (ref 1.15–1.40)
Chloride: 103 mmol/L (ref 98–111)
Creatinine, Ser: 0.6 mg/dL (ref 0.44–1.00)
Glucose, Bld: 28 mg/dL — CL (ref 70–99)
HCT: 40 % (ref 36.0–46.0)
Hemoglobin: 13.6 g/dL (ref 12.0–15.0)
Potassium: 2.6 mmol/L — CL (ref 3.5–5.1)
Sodium: 145 mmol/L (ref 135–145)
TCO2: 25 mmol/L (ref 22–32)

## 2024-09-19 LAB — CBG MONITORING, ED
Glucose-Capillary: 138 mg/dL — ABNORMAL HIGH (ref 70–99)
Glucose-Capillary: 154 mg/dL — ABNORMAL HIGH (ref 70–99)
Glucose-Capillary: 154 mg/dL — ABNORMAL HIGH (ref 70–99)
Glucose-Capillary: 20 mg/dL — CL (ref 70–99)
Glucose-Capillary: 32 mg/dL — CL (ref 70–99)
Glucose-Capillary: 98 mg/dL (ref 70–99)

## 2024-09-19 LAB — BASIC METABOLIC PANEL WITH GFR
Anion gap: 13 (ref 5–15)
BUN: 7 mg/dL (ref 6–20)
CO2: 24 mmol/L (ref 22–32)
Calcium: 8.5 mg/dL — ABNORMAL LOW (ref 8.9–10.3)
Chloride: 107 mmol/L (ref 98–111)
Creatinine, Ser: 0.63 mg/dL (ref 0.44–1.00)
GFR, Estimated: >60 mL/min
Glucose, Bld: 73 mg/dL (ref 70–99)
Potassium: 3.2 mmol/L — ABNORMAL LOW (ref 3.5–5.1)
Sodium: 143 mmol/L (ref 135–145)

## 2024-09-19 MED ORDER — DEXTROSE 10 % IV SOLN: INTRAVENOUS | Status: DC

## 2024-09-19 MED ORDER — POTASSIUM CHLORIDE CRYS ER 20 MEQ PO TBCR
40.0000 meq | EXTENDED_RELEASE_TABLET | Freq: Once | ORAL | Status: AC
  Administered 2024-09-19: 40 meq via ORAL
  Filled 2024-09-19: qty 2

## 2024-09-19 MED ORDER — PANTOPRAZOLE SODIUM 40 MG PO TBEC
40.0000 mg | DELAYED_RELEASE_TABLET | Freq: Two times a day (BID) | ORAL | Status: DC
  Administered 2024-09-20 – 2024-09-21 (×4): 40 mg via ORAL
  Filled 2024-09-19 (×4): qty 1

## 2024-09-19 MED ORDER — ENOXAPARIN SODIUM 40 MG/0.4ML IJ SOSY: 40.0000 mg | PREFILLED_SYRINGE | INTRAMUSCULAR | Status: DC

## 2024-09-19 MED ORDER — KETOROLAC TROMETHAMINE 15 MG/ML IJ SOLN
15.0000 mg | Freq: Once | INTRAMUSCULAR | Status: AC
  Administered 2024-09-19: 15 mg via INTRAVENOUS
  Filled 2024-09-19: qty 1

## 2024-09-19 MED ORDER — DEXTROSE 50 % IV SOLN
INTRAVENOUS | Status: AC
  Administered 2024-09-19: 25 g via INTRAVENOUS
  Filled 2024-09-19: qty 50

## 2024-09-19 MED ORDER — POTASSIUM CHLORIDE 10 MEQ/100ML IV SOLN
10.0000 meq | INTRAVENOUS | Status: AC
  Administered 2024-09-19 (×4): 10 meq via INTRAVENOUS
  Filled 2024-09-19 (×4): qty 100

## 2024-09-19 MED ORDER — OXYCODONE HCL ER 15 MG PO T12A: 15.0000 mg | EXTENDED_RELEASE_TABLET | Freq: Two times a day (BID) | ORAL | Status: DC

## 2024-09-19 MED ORDER — DULOXETINE HCL 60 MG PO CPEP
60.0000 mg | ORAL_CAPSULE | Freq: Every day | ORAL | Status: DC
  Administered 2024-09-19 – 2024-09-20 (×2): 60 mg via ORAL
  Filled 2024-09-19: qty 1
  Filled 2024-09-19: qty 2

## 2024-09-19 MED ORDER — SODIUM CHLORIDE 0.9 % IV BOLUS
1000.0000 mL | Freq: Once | INTRAVENOUS | Status: AC
  Administered 2024-09-19: 1000 mL via INTRAVENOUS

## 2024-09-19 MED ORDER — DEXTROSE 50 % IV SOLN
50.0000 mL | Freq: Once | INTRAVENOUS | Status: AC
  Administered 2024-09-19: 50 mL via INTRAVENOUS

## 2024-09-19 MED ORDER — DEXTROSE 50 % IV SOLN
INTRAVENOUS | Status: AC
  Administered 2024-09-19: 50 mL
  Filled 2024-09-19: qty 50

## 2024-09-19 MED ORDER — OXYCODONE HCL 5 MG PO TABS
15.0000 mg | ORAL_TABLET | Freq: Four times a day (QID) | ORAL | Status: DC | PRN
  Administered 2024-09-19 – 2024-09-20 (×4): 15 mg via ORAL
  Filled 2024-09-19 (×4): qty 3

## 2024-09-19 MED ORDER — HYDROXYZINE HCL 25 MG PO TABS
25.0000 mg | ORAL_TABLET | Freq: Once | ORAL | Status: AC
  Administered 2024-09-19: 25 mg via ORAL
  Filled 2024-09-19: qty 1

## 2024-09-19 MED ORDER — PROCHLORPERAZINE MALEATE 5 MG PO TABS
5.0000 mg | ORAL_TABLET | Freq: Two times a day (BID) | ORAL | Status: DC | PRN
  Administered 2024-09-20: 5 mg via ORAL
  Filled 2024-09-19: qty 1

## 2024-09-19 MED ORDER — METOCLOPRAMIDE HCL 5 MG/ML IJ SOLN
10.0000 mg | Freq: Once | INTRAMUSCULAR | Status: AC
  Administered 2024-09-19: 10 mg via INTRAVENOUS
  Filled 2024-09-19: qty 2

## 2024-09-19 MED ORDER — METOCLOPRAMIDE HCL 5 MG PO TABS
10.0000 mg | ORAL_TABLET | Freq: Four times a day (QID) | ORAL | Status: DC
  Administered 2024-09-20 – 2024-09-21 (×4): 10 mg via ORAL
  Filled 2024-09-19: qty 2
  Filled 2024-09-19: qty 1
  Filled 2024-09-19 (×2): qty 2
  Filled 2024-09-19 (×2): qty 1
  Filled 2024-09-19: qty 2

## 2024-09-19 MED ORDER — DICYCLOMINE HCL 10 MG PO CAPS
10.0000 mg | ORAL_CAPSULE | Freq: Four times a day (QID) | ORAL | Status: DC
  Administered 2024-09-20 – 2024-09-21 (×6): 10 mg via ORAL
  Filled 2024-09-19 (×6): qty 1

## 2024-09-19 MED ORDER — HYDRALAZINE HCL 20 MG/ML IJ SOLN: 10.0000 mg | Freq: Four times a day (QID) | INTRAMUSCULAR | Status: DC | PRN

## 2024-09-19 MED ORDER — DEXTROSE 50 % IV SOLN: 25.0000 g | Freq: Once | INTRAVENOUS | Status: AC

## 2024-09-19 NOTE — H&P (Signed)
 " History and Physical    Patient: [name] FMW:[id number] DOB: 09/22/1985 DOA: 09/19/2024 DOS: the patient was seen and examined on 09/19/2024 PCP: Cristopher Suzen HERO, NP  Patient coming from: Home  Chief Complaint:  Chief Complaint  Patient presents with   Diabetic Neuropathy    HPI: [name] is a 39 y.o. female with medical history significant of type 1 diabetes on control, diabetic gastroparesis, chronic abdominal pain, chronic narcotic use, cannabis disorder who presents to the ER with several days of intractable nausea vomiting and weakness.  EMS was called to patient's house with complaint of possible coma.  She was obtunded and out of it when she arrived the ER.  Patient was found to have blood sugar of 28.  Potassium 2.6.  She apparently has been having significant nausea vomiting for a few days.  She is currently awake after initial glucose bolus.  Her blood glucose came up to 100 but went back down to 33.  Patient has not had any meals since days.  She reported running out of her narcotics few days ago.  Patient complained of 10 out of 10 pain.  Pain is all over.  She has diabetic neuropathy.  At this point patient is being admitted with severe hypoglycemia, severe hypokalemia as well as intractable nausea vomiting.  Review of Systems: As mentioned in the history of present illness. All other systems reviewed and are negative. Past Medical History:  Diagnosis Date   Chronic constipation 11/08/2021   Diabetic neuropathy (HCC)    Gastroparesis    History of chicken pox    History of noncompliance with medical treatment 09/07/2022   Type 1 diabetes (HCC)    Vaginal odor 11/08/2021   Past Surgical History:  Procedure Laterality Date   CESAREAN SECTION     only one C/S per pt.   CO2 LASER APPLICATION N/A 02/22/2022   Procedure: CO2 LASER APPLICATION TO VULVA;  Surgeon: Viktoria Comer SAUNDERS, MD;  Location: [facility];  Service: General;   Laterality: N/A;   DILATION AND CURETTAGE OF UTERUS     EXCISION OF SKIN TAG N/A 02/22/2022   Procedure: EXCISION OF ANAL SKIN TAGS;  Surgeon: Debby Hila, MD;  Location: [facility];  Service: General;  Laterality: N/A;   Social History:  reports that she has never smoked. She has never been exposed to tobacco smoke. She has never used smokeless tobacco. She reports current alcohol use. She reports that she does not currently use drugs after having used the following drugs: Marijuana.  Allergies[1]  Family History  Problem Relation Age of Onset   Hypertension Mother    Glaucoma Mother    Diabetes Maternal Grandmother    Neuropathy Neg Hx    Colon cancer Neg Hx    Stomach cancer Neg Hx    Esophageal cancer Neg Hx    Colon polyps Neg Hx    Rectal cancer Neg Hx     Prior to Admission medications  Medication Sig Start Date End Date Taking? Authorizing Provider  Acetaminophen  (MIDOL  PO) Take 1 tablet by mouth every 6 (six) hours as needed (cramping).    [provider]  Continuous Glucose Sensor (DEXCOM G7 SENSOR) MISC Inject 1 Device into the skin See admin instructions. Place 1 new sensor into the skin every 10 days    [provider]  dicyclomine  (BENTYL ) 10 MG capsule Take 10 mg by mouth 4 (four) times daily. 06/24/24   [provider]  DULoxetine  (CYMBALTA ) 60 MG capsule Take 60 mg by mouth at bedtime. 06/28/22   [provider]  hyoscyamine  (LEVSIN  SL) 0.125 MG SL tablet PLACE 1 TABLET (0.125 MG TOTAL) UNDER THE TONGUE EVERY 8 (EIGHT) HOURS AS NEEDED FOR CRAMPING. 09/17/24   May, [name], NP  insulin  aspart (NOVOLOG ) 100 UNIT/ML FlexPen Inject 0-9 Units into the skin 3 (three) times daily with meals. CBG < 70: Eat or drink something sweet and recheck, CBG 70 - 120: 0 units CBG 121 - 150: 1 unit CBG 151 - 200: 2 units CBG 201 - 250: 3 units CBG 251 - 300: 5 units CBG 301 - 350: 7 units CBG 351 - 400: 9 units CBG > 400: call MD. 08/07/23  06/28/25  [name], MD  insulin  glargine (LANTUS ) 100 UNIT/ML Solostar Pen Inject 12 Units into the skin daily. May substitute as needed per insurance. Patient taking differently: Inject 40 Units into the skin 2 (two) times daily. 10/15/23 06/28/25  [place], Camellia PARAS, DO  metoCLOPramide  (REGLAN ) 10 MG tablet Take 1 tablet (10 mg total) by mouth every 6 (six) hours. 08/16/24   Geiple, Joshua, PA-C  oxyCODONE  (ROXICODONE ) 15 MG immediate release tablet Take 15 mg by mouth 4 (four) times daily. 04/28/24   [provider]  pantoprazole  (PROTONIX ) 40 MG tablet Take 1 tablet (40 mg total) by mouth 2 (two) times daily. 07/29/24   Suzann Inocente HERO, MD  polyethylene glycol (MIRALAX  / GLYCOLAX ) 17 g packet Take 17 g by mouth daily as needed for mild constipation. 04/26/24   Pokhrel, Vernal, MD  prochlorperazine  (COMPAZINE ) 5 MG tablet Take 1 tablet (5 mg total) by mouth every 12 (twelve) hours as needed for nausea or vomiting. 07/28/24   May, [name], NP  promethazine  (PHENERGAN ) 25 MG suppository Place 1 suppository (25 mg total) rectally every 6 (six) hours as needed for nausea or vomiting. 08/16/24   Desiderio Chew, PA-C  Prucalopride Succinate  (MOTEGRITY ) 2 MG TABS Take 1 tablet (2 mg total) by mouth daily. 07/28/24 10/26/24  May, [name], NP    Physical Exam: Vitals:   09/19/24 1836 09/19/24 2003 09/19/24 2030 09/19/24 2110  BP: (!) 160/102 (!) 141/79 (!) 152/81   Pulse: (!) 120  (!) 103   Resp: (!) 26 17 16    Temp: (!) 97.4 F (36.3 C)     SpO2: 97%  100%   Weight:    68.5 kg  Height:    5' 6 (1.676 m)   Constitutional: Acutely ill looking, NAD, calm, comfortable Eyes: PERRL, lids and conjunctivae normal ENMT: Mucous membranes are dry. Posterior pharynx clear of any exudate or lesions.Normal dentition.  Neck: normal, supple, no masses, no thyromegaly Respiratory: clear to auscultation bilaterally, no wheezing, no crackles. Normal respiratory effort. No accessory muscle use.   Cardiovascular: Sinus tachycardia, no murmurs / rubs / gallops. No extremity edema. 2+ pedal pulses. No carotid bruits.  Abdomen: no tenderness, no masses palpated. No hepatosplenomegaly. Bowel sounds positive.  Musculoskeletal: Good range of motion, no joint swelling or tenderness, Skin: no rashes, lesions, ulcers. No induration Neurologic: CN 2-12 grossly intact. Sensation intact, DTR normal. Strength 5/5 in all 4.  Psychiatric: Normal judgment and insight. Alert and oriented x 3.  Depressed mood  Data Reviewed:  Temperature 97.4, blood pressure 116/91, pulse 140 respiratory 7 O2 sat 97% room air, glucose 28 and currently 32, potassium 2.6, white count 16.9,  Assessment and Plan:  #1 severe hypoglycemia: Patient will  be admitted to progressive care unit.  Initiate D10.  Nausea vomiting control and oral diet also.  Continue to monitor blood glucose q. hourly until stable.  #2 severe hypokalemia: Most likely from intermittent nausea with vomiting.  Continue potassium repletion  #3 intractable nausea with vomiting: Multifactorial.  Most likely secondary to gastroparesis, narcotic withdrawal and possible cannabis.  At this point patient will be admitted.  Initiate control of nausea and vomiting.  #4 Tachypnea: Secondary to dehydration.  Continue aggressive hydration monitor.  #5 chronic pain syndrome: Patient has chronic narcotic abuse.  Has chronic pain syndrome as well as chronic diabetic neuropathy.  Will resume home regimen.  Patient may be withdrawing from her narcotics to her current symptoms.  #6 diabetic gastroparesis: Continue with Reglan     Advance Care Planning:   Code Status: Full Code   Consults: None  Family Communication: No family at bedside  Severity of Illness: The appropriate patient status for this patient is INPATIENT. Inpatient status is judged to be reasonable and necessary in order to provide the required intensity of service to ensure the patient's safety.  The patient's presenting symptoms, physical exam findings, and initial radiographic and laboratory data in the context of their chronic comorbidities is felt to place them at high risk for further clinical deterioration. Furthermore, it is not anticipated that the patient will be medically stable for discharge from the hospital within 2 midnights of admission.   * I certify that at the point of admission it is my clinical judgment that the patient will require inpatient hospital care spanning beyond 2 midnights from the point of admission due to high intensity of service, high risk for further deterioration and high frequency of surveillance required.*  AuthorBETHA SIM KNOLL, MD 09/19/2024 9:53 PM  For on call review [email or web address].      [1]  Allergies Allergen Reactions   Mirapex [Pramipexole] Shortness Of Breath   "

## 2024-09-19 NOTE — ED Triage Notes (Signed)
 Pt bib [facility] coming from home. EMS called out for possible coma. EMS reports pt/family states she's been having episodes of syncope. During triage, patient reporting all over body pain, stating my whole body is hurting, I have neuropathy. EMS states patient ran out of medication a few days ago. Pt restless and crying during triage.  EMS VS: 200/110 120 HR 80 cbg 100% RA 4mg  zofran  given

## 2024-09-19 NOTE — ED Notes (Signed)
 ED Provider at bedside.

## 2024-09-19 NOTE — ED Notes (Signed)
 Pt noted to be diaphoretic and shaky on assessment. Pt CBG checked and found to be 20. Pt provided applejuice bt NT, provider notified, and pt given amp of D50

## 2024-09-19 NOTE — ED Notes (Signed)
Pt provided with 8 oz apple juice.

## 2024-09-19 NOTE — ED Notes (Signed)
 Patient became shaky diaphoretic and tachy rechecked sugar and was 32.  Given 3 cups of apple juice and D50

## 2024-09-20 DIAGNOSIS — E162 Hypoglycemia, unspecified: Secondary | ICD-10-CM | POA: Diagnosis not present

## 2024-09-20 LAB — BASIC METABOLIC PANEL WITH GFR
Anion gap: 7 (ref 5–15)
BUN: 5 mg/dL — ABNORMAL LOW (ref 6–20)
CO2: 25 mmol/L (ref 22–32)
Calcium: 8.4 mg/dL — ABNORMAL LOW (ref 8.9–10.3)
Chloride: 102 mmol/L (ref 98–111)
Creatinine, Ser: 0.66 mg/dL (ref 0.44–1.00)
GFR, Estimated: >60 mL/min
Glucose, Bld: 284 mg/dL — ABNORMAL HIGH (ref 70–99)
Potassium: 4 mmol/L (ref 3.5–5.1)
Sodium: 134 mmol/L — ABNORMAL LOW (ref 135–145)

## 2024-09-20 LAB — GLUCOSE, CAPILLARY
Glucose-Capillary: 111 mg/dL — ABNORMAL HIGH (ref 70–99)
Glucose-Capillary: 141 mg/dL — ABNORMAL HIGH (ref 70–99)
Glucose-Capillary: 174 mg/dL — ABNORMAL HIGH (ref 70–99)
Glucose-Capillary: 157 mg/dL — ABNORMAL HIGH (ref 70–99)

## 2024-09-20 LAB — PHOSPHORUS: Phosphorus: 2.1 mg/dL — ABNORMAL LOW (ref 2.5–4.6)

## 2024-09-20 LAB — CBG MONITORING, ED
Glucose-Capillary: 137 mg/dL — ABNORMAL HIGH (ref 70–99)
Glucose-Capillary: 276 mg/dL — ABNORMAL HIGH (ref 70–99)
Glucose-Capillary: 240 mg/dL — ABNORMAL HIGH (ref 70–99)
Glucose-Capillary: 191 mg/dL — ABNORMAL HIGH (ref 70–99)
Glucose-Capillary: 160 mg/dL — ABNORMAL HIGH (ref 70–99)

## 2024-09-20 LAB — HCG, SERUM, QUALITATIVE: Preg, Serum: NEGATIVE

## 2024-09-20 LAB — MAGNESIUM: Magnesium: 1.6 mg/dL — ABNORMAL LOW (ref 1.7–2.4)

## 2024-09-20 MED ORDER — MELATONIN 5 MG PO TABS
5.0000 mg | ORAL_TABLET | Freq: Once | ORAL | Status: AC
  Administered 2024-09-20: 5 mg via ORAL
  Filled 2024-09-20: qty 1

## 2024-09-20 MED ORDER — INSULIN GLARGINE-YFGN 100 UNIT/ML ~~LOC~~ SOLN
4.0000 [IU] | Freq: Every day | SUBCUTANEOUS | Status: DC
  Administered 2024-09-20: 4 [IU] via SUBCUTANEOUS
  Filled 2024-09-20 (×2): qty 0.04

## 2024-09-20 MED ORDER — POTASSIUM & SODIUM PHOSPHATES 280-160-250 MG PO PACK
1.0000 | PACK | Freq: Three times a day (TID) | ORAL | Status: DC
  Administered 2024-09-20 – 2024-09-21 (×3): 1 via ORAL
  Filled 2024-09-20 (×5): qty 1

## 2024-09-20 MED ORDER — PRUCALOPRIDE SUCCINATE 2 MG PO TABS: 2.0000 mg | ORAL_TABLET | Freq: Every day | ORAL | Status: DC

## 2024-09-20 MED ORDER — INSULIN ASPART 100 UNIT/ML IJ SOLN
0.0000 [IU] | INTRAMUSCULAR | Status: DC
  Administered 2024-09-20 (×2): 1 [IU] via SUBCUTANEOUS
  Filled 2024-09-20 (×2): qty 1

## 2024-09-20 MED ORDER — ENOXAPARIN SODIUM 40 MG/0.4ML IJ SOSY
40.0000 mg | PREFILLED_SYRINGE | INTRAMUSCULAR | Status: DC
  Administered 2024-09-20 (×2): 40 mg via SUBCUTANEOUS
  Filled 2024-09-20 (×2): qty 0.4

## 2024-09-20 MED ORDER — SODIUM CHLORIDE 0.9 % IV SOLN: INTRAVENOUS | Status: AC

## 2024-09-20 NOTE — Progress Notes (Signed)
 " PROGRESS NOTE    [name]  FMW:[id number] DOB: 05-17-1986 DOA: 09/19/2024 PCP: Cristopher Suzen HERO, NP    Brief Narrative:  39 year old female with chronic diabetic gastroparesis syndrome, chronic abdominal pain and body ache, on long-term opiates, type 1 diabetes on insulin  with history of recurrent hypoglycemia, cannabis use disorder who was brought to the emergency room with unresponsiveness.  Family member called EMS stating patient is possibly in coma.  During triage, patient was complaining of pain, reportedly ran out of opiates and blood sugar was 28.  Given dextrose  and brought to the ER.  In the emergency room stabilized with blood sugar of 80.  Severe electrolyte abnormalities noted.  Admitted for symptomatic treatment.  Subjective: Patient was seen and examined.  Still in the ER.  D10 was stopped about 2 hours ago and she is maintaining her blood sugars now.  Blood sugars are better now. Still nauseated but feels less intense and pain is better.   Start sliding scale - can admit to medsurg bed with close sugar monitoring.   Assessment & Plan:   Intractable nausea vomiting secondary to gastroparesis, narcotic withdrawal and possibly cannabis use disorder. Severe significant symptoms. Continue to encourage clears, gradually advance diet. Around-the-clock nausea medications.  Resume Motegrity . Electrolyte corrections. Discontinue dextrose  fluid and start NS drip.   Severe hypoglycemia secondary to inadequate oral intake in a patient with type 1 diabetes: Patient previously needed dextrose  infusion.  Blood sugars are stabilizing.  Will closely monitor.  Avoid hypoglycemia.  Encourage frequent oral intake. Start 4 units lantus  tonight if blood sugars >90 and start very sensitive sliding scale insulin .   Electrolytes Severe hypokalemia: Replace aggressively and keep on a scheduled replacement. Severe hypomagnesemia: Replace further today and monitor levels.  Chronic  pain syndrome: On chronic narcotics with oxycodone  20 mg every 6 hours at home.  Continue.    DVT prophylaxis: enoxaparin  (LOVENOX ) injection 40 mg Start: 09/20/24 1400   Code Status: Full code Family Communication: None at bedside Disposition Plan: Status is: Inpatient Remains inpatient appropriate because: Severe electrolyte abnormalities, unable to eat     Consultants:  None  Procedures:  None  Antimicrobials:  None     Objective: Vitals:   09/20/24 1000 09/20/24 1015 09/20/24 1030 09/20/24 1045  BP: (!) 128/96 (!) 142/100 106/73 107/73  Pulse: 100 (!) 106 (!) 104 (!) 103  Resp: 16 19 14 20   Temp:   98.1 F (36.7 C)   TempSrc:   Oral   SpO2: 100% 100% 100% 100%  Weight:      Height:        Intake/Output Summary (Last 24 hours) at 09/20/2024 1235 Last data filed at 09/20/2024 9372 Gross per 24 hour  Intake 2026 ml  Output --  Net 2026 ml   Filed Weights   09/19/24 2110  Weight: 68.5 kg    Examination:  General: Slightly anxious.  Otherwise comfortable. Cardiovascular: S1-S2 normal.  Regular rate rhythm.  Tachycardic. Respiratory: Bilateral clear.  On room air. Gastrointestinal: Soft.  Nontender.  Bowel sounds present. Ext: No swelling or edema.  No cyanosis. Neuro: Alert awake and oriented.     Data Reviewed: I have personally reviewed following labs and imaging studies  CBC: Recent Labs  Lab 09/19/24 1920 09/19/24 1930  WBC 16.9*  --   NEUTROABS 12.5*  --   HGB 13.0 13.6  HCT 38.0 40.0  MCV 91.6  --   PLT 448*  --    Basic Metabolic  Panel: Recent Labs  Lab 09/19/24 1924 09/19/24 1930 09/20/24 0825  NA 143 145 134*  K 3.2* 2.6* 4.0  CL 107 103 102  CO2 24  --  25  GLUCOSE 73 28* 284*  BUN 7 5* 5*  CREATININE 0.63 0.60 0.66  CALCIUM  8.5*  --  8.4*  MG  --   --  1.6*  PHOS  --   --  2.1*   GFR: Estimated Creatinine Clearance: 89.3 mL/min (by C-G formula based on SCr of 0.66 mg/dL). Liver Function Tests: No results for  input(s): AST, ALT, ALKPHOS, BILITOT, PROT, ALBUMIN in the last 168 hours. No results for input(s): LIPASE, AMYLASE in the last 168 hours. No results for input(s): AMMONIA in the last 168 hours. Coagulation Profile: No results for input(s): INR, PROTIME in the last 168 hours. Cardiac Enzymes: No results for input(s): CKTOTAL, CKMB, CKMBINDEX, TROPONINI in the last 168 hours. BNP (last 3 results) No results for input(s): PROBNP in the last 8760 hours. HbA1C: No results for input(s): HGBA1C in the last 72 hours. CBG: Recent Labs  Lab 09/20/24 0154 09/20/24 0604 09/20/24 0821 09/20/24 1009 09/20/24 1200  GLUCAP 137* 276* 240* 191* 160*   Lipid Profile: No results for input(s): CHOL, HDL, LDLCALC, TRIG, CHOLHDL, LDLDIRECT in the last 72 hours. Thyroid  Function Tests: No results for input(s): TSH, T4TOTAL, FREET4, T3FREE, THYROIDAB in the last 72 hours. Anemia Panel: No results for input(s): VITAMINB12, FOLATE, FERRITIN, TIBC, IRON, RETICCTPCT in the last 72 hours. Sepsis Labs: No results for input(s): PROCALCITON, LATICACIDVEN in the last 168 hours.  No results found for this or any previous visit (from the past 240 hours).       Radiology Studies: No results found.      Scheduled Meds:  dicyclomine   10 mg Oral QID   DULoxetine   60 mg Oral QHS   enoxaparin  (LOVENOX ) injection  40 mg Subcutaneous Q24H   insulin  aspart  0-6 Units Subcutaneous Q4H   insulin  glargine-yfgn  4 Units Subcutaneous QHS   metoCLOPramide   10 mg Oral Q6H   pantoprazole   40 mg Oral BID   Prucalopride Succinate   2 mg Oral Daily   Continuous Infusions:  sodium chloride  40 mL/hr at 09/20/24 1014     LOS: 1 day       Matias Thurman, MD [facility]  "

## 2024-09-20 NOTE — Plan of Care (Signed)

## 2024-09-20 NOTE — ED Notes (Signed)
 Reached out to Dr. Franky in regards to patient most recent CBG while being on D10 infusion. MD stated to decrease rate from 100 mL/hr to 50 mL/hr.

## 2024-09-20 NOTE — ED Notes (Signed)
Pt ambulated to restroom, pt tolerated well.  

## 2024-09-20 NOTE — Inpatient Diabetes Management (Signed)
 Inpatient Diabetes Program Recommendations  AACE/ADA: New Consensus Statement on Inpatient Glycemic Control (2015)  Target Ranges:  Prepandial:   less than 140 mg/dL      Peak postprandial:   less than 180 mg/dL (1-2 hours)      Critically ill patients:  140 - 180 mg/dL   Lab Results  Component Value Date   GLUCAP 240 (H) 09/20/2024   HGBA1C 9.5 (H) 07/28/2024    Review of Glycemic Control  Latest Reference Range & Units 09/19/24 19:20 09/19/24 20:04 09/19/24 21:40 09/19/24 22:03 09/19/24 22:50 09/19/24 23:58 09/20/24 01:54 09/20/24 06:04 09/20/24 08:21  Glucose-Capillary 70 - 99 mg/dL 20 (LL) 98 32 (LL) 861 (H) 154 (H) 154 (H) 137 (H) 276 (H) 240 (H)  (LL): Data is critically low (H): Data is abnormally high  Diabetes history: DM1(does not make insulin .  Needs correction, basal and meal coverage)  Outpatient Diabetes medications:  Lantus  12 units QD Novolog  0-9 units TID + Correction Dexcom G7  Current orders for Inpatient glycemic control: none  Inpatient Diabetes Program Recommendations:    Recurrent hypoglycemia on arrival to ED.  Patient has T1DM and does not make insulin .  High risk for DKA.  CBG is 240 mg/dL this morning.    Might consider:  Novolog  0-6 units Q4H.  Will need some basal insulin  at some point today.  Lantus  6 units every day (50% of home dose).  Thank you, Wyvonna Pinal, MSN, CDCES Diabetes Coordinator Inpatient Diabetes Program [phone] (team pager from 8a-5p)

## 2024-09-21 DIAGNOSIS — E162 Hypoglycemia, unspecified: Secondary | ICD-10-CM | POA: Diagnosis not present

## 2024-09-21 LAB — CBC WITH DIFFERENTIAL/PLATELET
Abs Immature Granulocytes: 0.02 10*3/uL (ref 0.00–0.07)
Basophils Absolute: 0 10*3/uL (ref 0.0–0.1)
Basophils Relative: 0 %
Eosinophils Absolute: 0.2 10*3/uL (ref 0.0–0.5)
Eosinophils Relative: 2 %
HCT: 34.8 % — ABNORMAL LOW (ref 36.0–46.0)
Hemoglobin: 11.7 g/dL — ABNORMAL LOW (ref 12.0–15.0)
Immature Granulocytes: 0 %
Lymphocytes Relative: 45 %
Lymphs Abs: 3.4 10*3/uL (ref 0.7–4.0)
MCH: 31.3 pg (ref 26.0–34.0)
MCHC: 33.6 g/dL (ref 30.0–36.0)
MCV: 93 fL (ref 80.0–100.0)
Monocytes Absolute: 0.5 10*3/uL (ref 0.1–1.0)
Monocytes Relative: 7 %
Neutro Abs: 3.3 10*3/uL (ref 1.7–7.7)
Neutrophils Relative %: 46 %
Platelets: 328 10*3/uL (ref 150–400)
RBC: 3.74 MIL/uL — ABNORMAL LOW (ref 3.87–5.11)
RDW: 13.5 % (ref 11.5–15.5)
WBC: 7.4 10*3/uL (ref 4.0–10.5)
nRBC: 0 % (ref 0.0–0.2)

## 2024-09-21 LAB — BASIC METABOLIC PANEL WITH GFR
Anion gap: 9 (ref 5–15)
BUN: 8 mg/dL (ref 6–20)
CO2: 27 mmol/L (ref 22–32)
Calcium: 9.1 mg/dL (ref 8.9–10.3)
Chloride: 103 mmol/L (ref 98–111)
Creatinine, Ser: 0.73 mg/dL (ref 0.44–1.00)
GFR, Estimated: >60 mL/min
Glucose, Bld: 98 mg/dL (ref 70–99)
Potassium: 4 mmol/L (ref 3.5–5.1)
Sodium: 139 mmol/L (ref 135–145)

## 2024-09-21 LAB — GLUCOSE, CAPILLARY
Glucose-Capillary: 120 mg/dL — ABNORMAL HIGH (ref 70–99)
Glucose-Capillary: 98 mg/dL (ref 70–99)
Glucose-Capillary: 101 mg/dL — ABNORMAL HIGH (ref 70–99)

## 2024-09-21 LAB — MAGNESIUM: Magnesium: 1.9 mg/dL (ref 1.7–2.4)

## 2024-09-21 LAB — PHOSPHORUS: Phosphorus: 4.5 mg/dL (ref 2.5–4.6)

## 2024-09-21 MED ORDER — INSULIN DETEMIR 100 UNIT/ML ~~LOC~~ SOLN: 20.0000 [IU] | Freq: Every day | SUBCUTANEOUS | Status: AC

## 2024-09-21 MED ORDER — INSULIN DETEMIR 100 UNIT/ML ~~LOC~~ SOLN: 20.0000 [IU] | Freq: Every day | SUBCUTANEOUS | Status: DC

## 2024-09-21 NOTE — Discharge Summary (Signed)
 Physician Discharge Summary  [name] FMW:[id number] DOB: October 02, 1985 DOA: 09/19/2024  PCP: Cristopher Suzen HERO, NP  Admit date: 09/19/2024 Discharge date: 09/21/2024  Admitted From: Home Disposition: Home  Recommendations for Outpatient Follow-up:  Follow up with PCP in 1-2 weeks Please obtain BMP/CBC/magnesium /phosphorus in one week   Discharge Condition: Stable CODE STATUS: Full code Diet recommendation: Low-carb diet, diabetic diet  Discharge summary: 39 year old female with chronic diabetic gastroparesis syndrome, chronic abdominal pain and body ache, on long-term opiates, type 1 diabetes on insulin  with history of recurrent hypoglycemia, cannabis use disorder who was brought to the emergency room with unresponsiveness.  Family member called EMS stating patient is possibly in coma.  Patient reported she was in and out of being conscious.  During triage, patient was complaining of pain, reportedly ran out of opiates and blood sugar was 20.  Given dextrose  and brought to the ER.  In the emergency room stabilized with blood sugar of 80.  Severe electrolyte abnormalities noted.  Admitted for symptomatic treatment.  Intractable nausea vomiting secondary to gastroparesis, narcotic withdrawal and possibly cannabis use disorder. Admitted due to severe significant symptoms with IV fluids and nausea medications.  Last 24 hours patient has been stabilized and not needing any nausea medications.  Eating regular diet. Electrolytes were corrected and normalized. She will go home, stay on gastroparesis diet.  She will continue to take Motegrity .   Severe hypoglycemia secondary to inadequate oral intake in a patient with type 1 diabetes: Needed dextrose  infusion on admission.  After starting to eat regular food, her blood sugars has normalized.  She is on sliding scale insulin  with carb count at home that she will continue. Since her blood sugars are stabilizing but is still 100-120.  She  will reduce her  long-acting insulin  from 40 units twice daily to 20 units daily until outpatient follow-up.  Encouraged to continue monitoring very closely at home. Dietary education given.   Electrolytes Potassium and magnesium  replaced aggressively and normalized.   Chronic pain syndrome: On chronic narcotics with oxycodone  20 mg every 6 hours at home.  Continue.  Stabilized to go home with close outpatient follow-up.   Discharge Diagnoses:  Principal Problem:   Hypoglycemia Active Problems:   Nausea & vomiting   Uncontrolled type 1 diabetes mellitus with hyperglycemia (HCC)   Diabetic polyneuropathy associated with type 1 diabetes mellitus (HCC)   Abdominal pain   Gastroesophageal reflux disease with esophagitis without hemorrhage   Chronic pain syndrome   Gastroparesis   Hypokalemia    Discharge Instructions  Discharge Instructions     Diet Carb Modified   Complete by: As directed    Increase activity slowly   Complete by: As directed       Allergies as of 09/21/2024       Reactions   Mirapex [pramipexole] Shortness Of Breath        Medication List     STOP taking these medications    insulin  glargine 100 UNIT/ML Solostar Pen Commonly known as: LANTUS    polyethylene glycol 17 g packet Commonly known as: MIRALAX  / GLYCOLAX    prochlorperazine  5 MG tablet Commonly known as: COMPAZINE    promethazine  25 MG suppository Commonly known as: PHENERGAN        TAKE these medications    Dexcom G7 Sensor Misc Inject 1 Device into the skin See admin instructions. Place 1 new sensor into the skin every 10 days   dicyclomine  10 MG capsule Commonly known as: BENTYL  Take 10 mg  by mouth 4 (four) times daily.   DULoxetine  60 MG capsule Commonly known as: CYMBALTA  Take 60 mg by mouth at bedtime.   hyoscyamine  0.125 MG SL tablet Commonly known as: LEVSIN  SL PLACE 1 TABLET (0.125 MG TOTAL) UNDER THE TONGUE EVERY 8 (EIGHT) HOURS AS NEEDED FOR CRAMPING.    insulin  aspart 100 UNIT/ML FlexPen Commonly known as: NOVOLOG  Inject 0-9 Units into the skin 3 (three) times daily with meals. CBG < 70: Eat or drink something sweet and recheck, CBG 70 - 120: 0 units CBG 121 - 150: 1 unit CBG 151 - 200: 2 units CBG 201 - 250: 3 units CBG 251 - 300: 5 units CBG 301 - 350: 7 units CBG 351 - 400: 9 units CBG > 400: call MD.   insulin  detemir 100 UNIT/ML injection Commonly known as: LEVEMIR  Inject 0.2 mLs (20 Units total) into the skin daily. What changed:  how much to take when to take this   metoCLOPramide  10 MG tablet Commonly known as: REGLAN  Take 1 tablet (10 mg total) by mouth every 6 (six) hours.   MIDOL  PO Take 1 tablet by mouth every 6 (six) hours as needed (cramping).   Oxycodone  HCl 20 MG Tabs Take 1 tablet by mouth 4 (four) times daily. What changed: Another medication with the same name was removed. Continue taking this medication, and follow the directions you see here.   pantoprazole  40 MG tablet Commonly known as: PROTONIX  Take 1 tablet (40 mg total) by mouth 2 (two) times daily.   Prucalopride Succinate  2 MG Tabs Commonly known as: Motegrity  Take 1 tablet (2 mg total) by mouth daily.        Allergies[1]  Consultations: None   Procedures/Studies: No results found. (Echo, Carotid, EGD, Colonoscopy, ERCP)    Subjective: Patient seen in the morning rounds.  Denies any complaints.  Denies any pain.  Able to eat regular diet.  She has not needed to use any nausea medicines since last 24 hours. Discussed about reducing insulin  doses and monitoring closely and patient is agreeable.   Discharge Exam: Vitals:   09/20/24 2330 09/21/24 0611  BP: 133/81 138/79  Pulse: 87 96  Resp: 17 17  Temp: 98 F (36.7 C) 98.2 F (36.8 C)  SpO2: 100% 98%   Vitals:   09/20/24 1415 09/20/24 1956 09/20/24 2330 09/21/24 0611  BP: (!) 146/102 121/86 133/81 138/79  Pulse: (!) 105 97 87 96  Resp: 16 17 17 17   Temp: 98.7 F (37.1 C)  98.6 F (37 C) 98 F (36.7 C) 98.2 F (36.8 C)  TempSrc: Oral Oral Oral Oral  SpO2: 100% 100% 100% 98%  Weight:      Height:        General: Pt is alert, awake, not in acute distress Cardiovascular: RRR, S1/S2 +, no rubs, no gallops Respiratory: CTA bilaterally, no wheezing, no rhonchi Abdominal: Soft, NT, ND, bowel sounds + Extremities: no edema, no cyanosis    The results of significant diagnostics from this hospitalization (including imaging, microbiology, ancillary and laboratory) are listed below for reference.     Microbiology: No results found for this or any previous visit (from the past 240 hours).   Labs: BNP (last 3 results) No results for input(s): BNP in the last 8760 hours. Basic Metabolic Panel: Recent Labs  Lab 09/19/24 1924 09/19/24 1930 09/20/24 0825 09/21/24 0505  NA 143 145 134* 139  K 3.2* 2.6* 4.0 4.0  CL 107 103 102 103  CO2 24  --  25 27  GLUCOSE 73 28* 284* 98  BUN 7 5* 5* 8  CREATININE 0.63 0.60 0.66 0.73  CALCIUM  8.5*  --  8.4* 9.1  MG  --   --  1.6* 1.9  PHOS  --   --  2.1* 4.5   Liver Function Tests: No results for input(s): AST, ALT, ALKPHOS, BILITOT, PROT, ALBUMIN in the last 168 hours. No results for input(s): LIPASE, AMYLASE in the last 168 hours. No results for input(s): AMMONIA in the last 168 hours. CBC: Recent Labs  Lab 09/19/24 1920 09/19/24 1930 09/21/24 0505  WBC 16.9*  --  7.4  NEUTROABS 12.5*  --  3.3  HGB 13.0 13.6 11.7*  HCT 38.0 40.0 34.8*  MCV 91.6  --  93.0  PLT 448*  --  328   Cardiac Enzymes: No results for input(s): CKTOTAL, CKMB, CKMBINDEX, TROPONINI in the last 168 hours. BNP: Invalid input(s): POCBNP CBG: Recent Labs  Lab 09/20/24 1832 09/20/24 2327 09/21/24 0253 09/21/24 0654 09/21/24 0757  GLUCAP 174* 157* 120* 98 101*   D-Dimer No results for input(s): DDIMER in the last 72 hours. Hgb A1c No results for input(s): HGBA1C in the last 72 hours. Lipid  Profile No results for input(s): CHOL, HDL, LDLCALC, TRIG, CHOLHDL, LDLDIRECT in the last 72 hours. Thyroid  function studies No results for input(s): TSH, T4TOTAL, T3FREE, THYROIDAB in the last 72 hours.  Invalid input(s): FREET3 Anemia work up No results for input(s): VITAMINB12, FOLATE, FERRITIN, TIBC, IRON, RETICCTPCT in the last 72 hours. Urinalysis    Component Value Date/Time   COLORURINE COLORLESS (A) 07/28/2024 2026   APPEARANCEUR CLEAR 07/28/2024 2026   LABSPEC 1.019 07/28/2024 2026   PHURINE 7.5 07/28/2024 2026   GLUCOSEU >1,000 (A) 07/28/2024 2026   HGBUR LARGE (A) 07/28/2024 2026   BILIRUBINUR NEGATIVE 07/28/2024 2026   BILIRUBINUR negative 11/08/2021 1424   KETONESUR >80 (A) 07/28/2024 2026   PROTEINUR 100 (A) 07/28/2024 2026   UROBILINOGEN 0.2 11/08/2021 1424   UROBILINOGEN 0.2 05/03/2021 0837   NITRITE NEGATIVE 07/28/2024 2026   LEUKOCYTESUR TRACE (A) 07/28/2024 2026   Sepsis Labs Recent Labs  Lab 09/19/24 1920 09/21/24 0505  WBC 16.9* 7.4   Microbiology No results found for this or any previous visit (from the past 240 hours).   Time coordinating discharge: 35 minutes  SIGNED:   Renato Applebaum, MD  [facility] 09/21/2024, 10:10 AM     [1]  Allergies Allergen Reactions   Mirapex [Pramipexole] Shortness Of Breath

## 2024-09-21 NOTE — Plan of Care (Signed)
   Problem: Coping: Goal: Ability to adjust to condition or change in health will improve Outcome: Progressing   Problem: Nutritional: Goal: Maintenance of adequate nutrition will improve Outcome: Progressing

## 2024-09-28 ENCOUNTER — Ambulatory Visit: Admitting: Gastroenterology

## 2024-10-15 ENCOUNTER — Encounter: Admitting: Pediatrics
